# Patient Record
Sex: Female | Born: 1940 | Race: White | Hispanic: No | Marital: Married | Smoking: Never smoker
Health system: Southern US, Community
[De-identification: ages and names within clinical notes are randomized; demographics above are authoritative.]

## PROBLEM LIST (undated history)

## (undated) DIAGNOSIS — G4700 Insomnia, unspecified: Secondary | ICD-10-CM

## (undated) DIAGNOSIS — B009 Herpesviral infection, unspecified: Secondary | ICD-10-CM

## (undated) DIAGNOSIS — R1310 Dysphagia, unspecified: Secondary | ICD-10-CM

## (undated) DIAGNOSIS — A389 Scarlet fever, uncomplicated: Secondary | ICD-10-CM

## (undated) DIAGNOSIS — G4730 Sleep apnea, unspecified: Secondary | ICD-10-CM

## (undated) DIAGNOSIS — M1990 Unspecified osteoarthritis, unspecified site: Secondary | ICD-10-CM

## (undated) DIAGNOSIS — F419 Anxiety disorder, unspecified: Secondary | ICD-10-CM

## (undated) DIAGNOSIS — R5382 Chronic fatigue, unspecified: Secondary | ICD-10-CM

## (undated) DIAGNOSIS — E079 Disorder of thyroid, unspecified: Secondary | ICD-10-CM

## (undated) DIAGNOSIS — IMO0001 Reserved for inherently not codable concepts without codable children: Secondary | ICD-10-CM

## (undated) DIAGNOSIS — E038 Other specified hypothyroidism: Secondary | ICD-10-CM

## (undated) DIAGNOSIS — E785 Hyperlipidemia, unspecified: Secondary | ICD-10-CM

## (undated) DIAGNOSIS — M797 Fibromyalgia: Secondary | ICD-10-CM

## (undated) DIAGNOSIS — Z9989 Dependence on other enabling machines and devices: Secondary | ICD-10-CM

## (undated) DIAGNOSIS — I00 Rheumatic fever without heart involvement: Secondary | ICD-10-CM

## (undated) DIAGNOSIS — F329 Major depressive disorder, single episode, unspecified: Secondary | ICD-10-CM

## (undated) DIAGNOSIS — G43909 Migraine, unspecified, not intractable, without status migrainosus: Secondary | ICD-10-CM

## (undated) DIAGNOSIS — H2510 Age-related nuclear cataract, unspecified eye: Secondary | ICD-10-CM

## (undated) DIAGNOSIS — H9190 Unspecified hearing loss, unspecified ear: Secondary | ICD-10-CM

## (undated) DIAGNOSIS — R52 Pain, unspecified: Secondary | ICD-10-CM

## (undated) DIAGNOSIS — E559 Vitamin D deficiency, unspecified: Secondary | ICD-10-CM

## (undated) DIAGNOSIS — H43811 Vitreous degeneration, right eye: Secondary | ICD-10-CM

## (undated) DIAGNOSIS — K5900 Constipation, unspecified: Secondary | ICD-10-CM

## (undated) DIAGNOSIS — F32A Depression, unspecified: Secondary | ICD-10-CM

## (undated) DIAGNOSIS — C801 Malignant (primary) neoplasm, unspecified: Secondary | ICD-10-CM

## (undated) DIAGNOSIS — M722 Plantar fascial fibromatosis: Secondary | ICD-10-CM

## (undated) DIAGNOSIS — G4733 Obstructive sleep apnea (adult) (pediatric): Secondary | ICD-10-CM

## (undated) HISTORY — DX: Dysphagia, unspecified: R13.10

## (undated) HISTORY — DX: Malignant (primary) neoplasm, unspecified: C80.1

## (undated) HISTORY — DX: Plantar fascial fibromatosis: M72.2

## (undated) HISTORY — DX: Rheumatic fever without heart involvement: I00

## (undated) HISTORY — DX: Age-related nuclear cataract, unspecified eye: H25.10

## (undated) HISTORY — DX: Vitamin D deficiency, unspecified: E55.9

## (undated) HISTORY — DX: Migraine, unspecified, not intractable, without status migrainosus: G43.909

## (undated) HISTORY — DX: Insomnia, unspecified: G47.00

## (undated) HISTORY — DX: Depression, unspecified: F32.A

## (undated) HISTORY — DX: Vitreous degeneration, right eye: H43.811

## (undated) HISTORY — DX: Obstructive sleep apnea (adult) (pediatric): G47.33

## (undated) HISTORY — DX: Dependence on other enabling machines and devices: Z99.89

## (undated) HISTORY — PX: TONSILLECTOMY: SUR1361

## (undated) HISTORY — DX: Anxiety disorder, unspecified: F41.9

## (undated) HISTORY — DX: Herpesviral infection, unspecified: B00.9

## (undated) HISTORY — DX: Unspecified osteoarthritis, unspecified site: M19.90

## (undated) HISTORY — PX: TUBAL LIGATION: SHX77

## (undated) HISTORY — DX: Sleep apnea, unspecified: G47.30

## (undated) HISTORY — DX: Disorder of thyroid, unspecified: E07.9

## (undated) HISTORY — PX: OTHER SURGICAL HISTORY: SHX169

## (undated) HISTORY — DX: Chronic fatigue, unspecified: R53.82

## (undated) HISTORY — DX: Fibromyalgia: M79.7

## (undated) HISTORY — DX: Hyperlipidemia, unspecified: E78.5

## (undated) HISTORY — DX: Other specified hypothyroidism: E03.8

## (undated) HISTORY — DX: Major depressive disorder, single episode, unspecified: F32.9

---
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
## 1986-11-25 DIAGNOSIS — G43909 Migraine, unspecified, not intractable, without status migrainosus: Secondary | ICD-10-CM

## 1986-11-25 HISTORY — DX: Migraine, unspecified, not intractable, without status migrainosus: G43.909

## 1987-05-26 DIAGNOSIS — B009 Herpesviral infection, unspecified: Secondary | ICD-10-CM

## 1987-05-26 HISTORY — DX: Herpesviral infection, unspecified: B00.9

## 1995-11-25 HISTORY — PX: DE QUERVAIN'S RELEASE: SHX1439

## 1996-02-25 HISTORY — PX: BREAST EXCISIONAL BIOPSY: SUR124

## 1996-09-24 DIAGNOSIS — M797 Fibromyalgia: Secondary | ICD-10-CM

## 1996-09-24 HISTORY — DX: Fibromyalgia: M79.7

## 1997-12-04 ENCOUNTER — Ambulatory Visit (HOSPITAL_COMMUNITY): Admission: RE | Admit: 1997-12-04 | Discharge: 1997-12-04 | Payer: Self-pay

## 1998-01-24 DIAGNOSIS — E038 Other specified hypothyroidism: Secondary | ICD-10-CM

## 1998-01-24 HISTORY — DX: Other specified hypothyroidism: E03.8

## 1998-01-31 ENCOUNTER — Ambulatory Visit (HOSPITAL_COMMUNITY): Admission: RE | Admit: 1998-01-31 | Discharge: 1998-01-31 | Payer: Self-pay | Admitting: *Deleted

## 1998-02-19 ENCOUNTER — Ambulatory Visit (HOSPITAL_COMMUNITY): Admission: RE | Admit: 1998-02-19 | Discharge: 1998-02-19 | Payer: Self-pay | Admitting: *Deleted

## 1998-04-02 ENCOUNTER — Other Ambulatory Visit: Admission: RE | Admit: 1998-04-02 | Discharge: 1998-04-02 | Payer: Self-pay | Admitting: *Deleted

## 1998-07-26 HISTORY — PX: BREAST BIOPSY: SHX20

## 1998-11-20 ENCOUNTER — Encounter (INDEPENDENT_AMBULATORY_CARE_PROVIDER_SITE_OTHER): Payer: Self-pay | Admitting: Specialist

## 1998-11-20 ENCOUNTER — Ambulatory Visit (HOSPITAL_COMMUNITY): Admission: RE | Admit: 1998-11-20 | Discharge: 1998-11-20 | Payer: Self-pay | Admitting: *Deleted

## 1999-03-29 ENCOUNTER — Other Ambulatory Visit: Admission: RE | Admit: 1999-03-29 | Discharge: 1999-03-29 | Payer: Self-pay | Admitting: *Deleted

## 1999-07-18 ENCOUNTER — Encounter: Payer: Self-pay | Admitting: Gastroenterology

## 1999-07-18 ENCOUNTER — Ambulatory Visit (HOSPITAL_COMMUNITY): Admission: RE | Admit: 1999-07-18 | Discharge: 1999-07-18 | Payer: Self-pay | Admitting: Gastroenterology

## 1999-10-21 ENCOUNTER — Encounter: Payer: Self-pay | Admitting: Orthopaedic Surgery

## 1999-10-21 ENCOUNTER — Ambulatory Visit (HOSPITAL_COMMUNITY): Admission: RE | Admit: 1999-10-21 | Discharge: 1999-10-21 | Payer: Self-pay | Admitting: Orthopaedic Surgery

## 2000-04-15 ENCOUNTER — Other Ambulatory Visit: Admission: RE | Admit: 2000-04-15 | Discharge: 2000-04-15 | Payer: Self-pay | Admitting: *Deleted

## 2000-07-25 ENCOUNTER — Emergency Department (HOSPITAL_COMMUNITY): Admission: EM | Admit: 2000-07-25 | Discharge: 2000-07-25 | Payer: Self-pay | Admitting: Emergency Medicine

## 2000-07-25 ENCOUNTER — Encounter: Payer: Self-pay | Admitting: Orthopedic Surgery

## 2001-03-19 ENCOUNTER — Other Ambulatory Visit: Admission: RE | Admit: 2001-03-19 | Discharge: 2001-03-19 | Payer: Self-pay | Admitting: Obstetrics and Gynecology

## 2002-03-30 ENCOUNTER — Other Ambulatory Visit: Admission: RE | Admit: 2002-03-30 | Discharge: 2002-03-30 | Payer: Self-pay | Admitting: *Deleted

## 2003-04-12 ENCOUNTER — Other Ambulatory Visit: Admission: RE | Admit: 2003-04-12 | Discharge: 2003-04-12 | Payer: Self-pay | Admitting: *Deleted

## 2004-02-29 ENCOUNTER — Encounter: Admission: RE | Admit: 2004-02-29 | Discharge: 2004-02-29 | Payer: Self-pay | Admitting: Rheumatology

## 2004-05-06 ENCOUNTER — Other Ambulatory Visit: Admission: RE | Admit: 2004-05-06 | Discharge: 2004-05-06 | Payer: Self-pay | Admitting: *Deleted

## 2004-08-24 HISTORY — PX: TOTAL KNEE ARTHROPLASTY: SHX125

## 2004-10-05 ENCOUNTER — Encounter: Admission: RE | Admit: 2004-10-05 | Discharge: 2004-10-05 | Payer: Self-pay | Admitting: *Deleted

## 2005-02-24 DIAGNOSIS — M1990 Unspecified osteoarthritis, unspecified site: Secondary | ICD-10-CM

## 2005-02-24 HISTORY — DX: Unspecified osteoarthritis, unspecified site: M19.90

## 2005-03-27 DIAGNOSIS — G4733 Obstructive sleep apnea (adult) (pediatric): Secondary | ICD-10-CM

## 2005-03-27 HISTORY — DX: Obstructive sleep apnea (adult) (pediatric): G47.33

## 2005-05-09 ENCOUNTER — Other Ambulatory Visit: Admission: RE | Admit: 2005-05-09 | Discharge: 2005-05-09 | Payer: Self-pay | Admitting: Obstetrics & Gynecology

## 2005-09-08 ENCOUNTER — Inpatient Hospital Stay (HOSPITAL_COMMUNITY): Admission: RE | Admit: 2005-09-08 | Discharge: 2005-09-12 | Payer: Self-pay | Admitting: Orthopedic Surgery

## 2006-05-14 ENCOUNTER — Other Ambulatory Visit: Admission: RE | Admit: 2006-05-14 | Discharge: 2006-05-14 | Payer: Self-pay | Admitting: Obstetrics & Gynecology

## 2006-05-19 ENCOUNTER — Encounter: Admission: RE | Admit: 2006-05-19 | Discharge: 2006-05-19 | Payer: Self-pay | Admitting: Obstetrics & Gynecology

## 2006-10-15 ENCOUNTER — Encounter: Admission: RE | Admit: 2006-10-15 | Discharge: 2006-10-15 | Payer: Self-pay | Admitting: Obstetrics & Gynecology

## 2007-08-16 ENCOUNTER — Other Ambulatory Visit: Admission: RE | Admit: 2007-08-16 | Discharge: 2007-08-16 | Payer: Self-pay | Admitting: Obstetrics & Gynecology

## 2007-09-01 LAB — HM DEXA SCAN

## 2007-10-19 ENCOUNTER — Encounter: Admission: RE | Admit: 2007-10-19 | Discharge: 2007-10-19 | Payer: Self-pay | Admitting: Obstetrics & Gynecology

## 2008-11-02 ENCOUNTER — Encounter: Admission: RE | Admit: 2008-11-02 | Discharge: 2008-11-02 | Payer: Self-pay | Admitting: Obstetrics & Gynecology

## 2009-02-12 DIAGNOSIS — M899 Disorder of bone, unspecified: Secondary | ICD-10-CM | POA: Insufficient documentation

## 2009-02-12 DIAGNOSIS — G43909 Migraine, unspecified, not intractable, without status migrainosus: Secondary | ICD-10-CM | POA: Insufficient documentation

## 2009-02-12 DIAGNOSIS — M722 Plantar fascial fibromatosis: Secondary | ICD-10-CM | POA: Insufficient documentation

## 2009-11-19 ENCOUNTER — Encounter: Admission: RE | Admit: 2009-11-19 | Discharge: 2009-11-19 | Payer: Self-pay | Admitting: Obstetrics & Gynecology

## 2010-03-16 ENCOUNTER — Encounter: Payer: Self-pay | Admitting: Rheumatology

## 2010-03-17 ENCOUNTER — Encounter: Payer: Self-pay | Admitting: Obstetrics & Gynecology

## 2010-07-12 NOTE — Op Note (Signed)
NAMEJESSE, HIRST                  ACCOUNT NO.:  [id number]   MEDICAL RECORD NO.:  [id number]          PATIENT TYPE:  INP   LOCATION:  NA                           FACILITY:  [facility]   PHYSICIAN:  Ollen Gross, M.D.    DATE OF BIRTH:  12-01-1940   DATE OF PROCEDURE:  09/08/2005  DATE OF DISCHARGE:                                 OPERATIVE REPORT   PREOPERATIVE DIAGNOSIS:  Osteoarthritis, left knee.   POSTOPERATIVE DIAGNOSIS:  Osteoarthritis, left knee.   PROCEDURE:  Left total knee arthroplasty.   SURGEON:  Ollen Gross, M.D.   ASSISTANT:  Alexzandrew L. Julien Girt, P.A.   ANESTHESIA:  General with postop Marcaine pain pump.   ESTIMATED BLOOD LOSS:  Minimal.   DRAINS:  Hemovac x1.   TOURNIQUET TIME:  38 minutes at 300 mmHg.   COMPLICATIONS:  None.  Condition stable to recovery.   BRIEF CLINICAL NOTE:  Ms. Redondo is a 70 year old female who has end-stage  osteoarthritis of the left knee with intractable pain.  She has failed  nonoperative management and presents for total knee arthroplasty.   PROCEDURE IN DETAIL:  The successful initiation of general anesthetic, a  tourniquet was placed on the left thigh and left lower extremity prepped and  draped in usual sterile fashion.  Extremity was wrapped in Esmarch, knee  flexed, tourniquet inflated to 300 mmHg.  Midline incision was made with 10  blade through subcutaneous tissue to level of the extensor mechanism.  Fresh  blade is used to make a medial parapatellar arthrotomy.  Some tissue over  the proximal medial tibia was subperiosteally elevated to the joint line  with a knife into the semimembranosus bursa with a Cobb elevator.  Soft  tissue of the proximal lateral tibia is also elevated attention being paid  to avoid patellar tendon on tibial tubercle.  Patella subluxed laterally,  knee flexed 90 degrees, ACL and PCL removed.  Drill was used to create a  starting hole and the distal femur canal was thoroughly irrigated.  5  degrees left valgus alignment guide was placed referencing off the posterior  condyles, rotations marked and the block pinned to remove 10 mm off the  distal femur.  Distal femoral resection made with an oscillating saw.  Size  3 was the most appropriate femoral component and rotation is marked off the  epicondylar axis.  Size 3 cutting block is placed and the anterior,  posterior and chamfer cuts made.   The tibia subluxed forward and the menisci removed.  Extramedullary tibial  alignment guide is placed referencing proximally at the medial aspect of  tibial tubercle and distally along the second metatarsal axis and tibial  crest.  Blocks pinned to remove approximately 10 mm of the nondeficient  lateral side.  We did not get down to the base of the medial defect so we  took additional 2 mm into the base of the medial defect.  Tibial resection  is made with an oscillating saw.  Size 3 the most appropriate tibial  component and a proximal tibia prepared with the modular drill  and keel  punch for the size 3.  Femoral preparation was completed with the  intercondylar cut.   Size 3 mobile bearing tibial trial with a size 3 posterior stabilized  femoral trial and a 12.5 mm posterior stabilized rotating platform insert  trial placed.  With the 12.5 full extension was achieved with excellent  varus and valgus balance throughout full range of motion.  Patella was  everted, thickness measured to be 20 mm and free hand resection taken to 11  mm.  35 template was placed, lug holes were drilled, trial patella was  placed and it tracks normally.  The osteophyte were then removed with the  posterior femur trial in place.  All trials removed and the cut bone  surfaces prepared with pulsatile lavage.  Cement mixed and once ready for  implantation the size 3 mobile bearing tibial tray, size 3 posterior  stabilized femur and 35 patella are cemented into place.  The patella was  held with the clamp.  Trial  12.5 insert was placed.  Knee held in full  extension, all extruded cement removed.  Once cement fully hardened, then a  permanent 12.5 mm posterior stabilized rotating platform insert is placed  into the tibial tray.  Wound was copiously irrigated with saline solution  and the extensor mechanism closed over Hemovac drain with interrupted #1  PDS.  Flexion against gravity is 135 degrees.  Tourniquet was released with  a total time of 38 minutes.  Subcu closed interrupted 2-0 Vicryl,  subcuticular running 4-0 Monocryl.  Drains hooked to suction.  The catheter  for Marcaine pain pump was placed and the pump initiated.  Steri-Strips and  bulky sterile dressing are applied and she is awakened and transferred to  recovery in stable condition.      Ollen Gross, M.D.  Electronically Signed     FA/MEDQ  D:  09/08/2005  T:  09/08/2005  Job:  [id number]

## 2010-07-12 NOTE — H&P (Signed)
NAMELAKEVA, [name]                  ACCOUNT NO.:  [id number]   MEDICAL RECORD NO.:  [id number]           PATIENT TYPE:   LOCATION:                                 FACILITY:   PHYSICIAN:  Ollen Gross, M.D.         DATE OF BIRTH:   DATE OF ADMISSION:  09/08/2005  DATE OF DISCHARGE:                                HISTORY & PHYSICAL   DATE OF OFFICE VISIT AND HISTORY & PHYSICAL:  September 02, 2005.   CHIEF COMPLAINT:  Left knee pain.   HISTORY OF PRESENT ILLNESS:  The patient is a 70 year old female who has  been seen by Dr. Lequita [name] for ongoing left knee pain.  It has progressively  gotten worse over several years now.  No specific injury. She has been seen  by Dr. Corliss [name] in the past and has undergone cortisone and also Hyalgan  injections without much benefit. Despite conservative measures she has  continued having worsening pain.  She has been seen in the office where x-  rays show bone on bone in the medial compartment with also some involvement  laterally and marginal osteophytes, and also some patellofemoral spurring.  She has reached the point where she would like to have something done about  it.  Risks and benefits have been discussed with the patient at length and  she elects to proceed with surgery.   ALLERGIES:  No known drug allergies.   INTOLERANCES:  1.  Codeine causes nausea.  2.  Percocet causes nausea.  3.  The patient is able to take Vicodin though.   CURRENT MEDICATIONS:  1.  Trimethoprim p.r.n. after intercourse.  2.  Celebrex 200 mg b.i.d.  3.  MiraLax p.r.n.  4.  Synthroid.  5.  Cytomel.  6.  Lunesta.  7.  Flexeril.  8.  Risperdal.  9.  Lodine and Cafergot occasionally.   PAST MEDICAL HISTORY:  1.  Migraines.  2.  Chronic insomnia.  3.  History of bronchitis.  4.  Sleep apnea.  5.  Hypothyroidism.  6.  Postmenopausal.  7.  Osteoporosis.  8.  History of UTIs.  9.  History of cystitis.   PAST SURGICAL HISTORY:  1.  Tonsillectomy and  adenoidectomy.  2.  Urethral surgery.  3.  Left breast lumpectomy benign.  4.  Face lift at age 22.  5.  Right arm surgery.  6.  Left wrist surgery.   SOCIAL HISTORY:  Married, retired, nonsmoker. Alcohol six to eight drinks  per week.  No children.   REVIEW OF SYSTEMS:  GENERAL: No fever, chills, nightsweats.  NEUROLOGIC:  History of chronic insomnia.  No seizure or syncope.  Does have migraines.  RESPIRATORY:  No shortness of breath, productive cough, or hemoptysis. She  does have sleep apnea which she uses a CPAP machine for.  CARDIOVASCULAR:  No chest pain, angina, or orthopnea. GI:  She does have chronic  constipation. No nausea, vomiting, or diarrhea.  GU: History of UTIs.  No  dysuria, hematuria, or discharge.  MUSCULOSKELETAL: Pertinent to that of the  left  knee.  VASCULAR:  Patient does give a significant history of having  veins that infiltrate very easily.   PHYSICAL EXAMINATION:  VITAL SIGNS: Pulse 74, respirations 16, blood  pressure 110/68.  GENERAL: A 70 year old female, well-nourished, well-developed, in no acute  distress, alert, oriented, cooperative, somewhat anxious at the time of the  exam. She is accompanied by her husband.  HEENT:  Normocephalic and atraumatic. Pupils are round and reactive.  Oropharynx is clear. EOMs are intact. Does have contact in the left eye.  NECK:  Supple. No carotid bruits are appreciated.  CHEST:  Clear, anterior and posterior chest wall. No rales, rhonchi, or  wheezes.  HEART:  Regular rate and rhythm.  No murmurs. S1 and S2 noted.  ABDOMEN:  Soft, nontender.  Bowel sounds are present.  RECTAL/BREASTS/GENITALIA: Not done; not pertinent to the present illness.  EXTREMITIES: The left knee showed range of motion of 5 to 123. She has  marked crepitus done on passive  range of motion. She is more tender radial  than lateral.   IMPRESSION:  1.  Osteoarthritis, left knee.  2.  Migraines.  3.  Chronic insomnia.  4.  History of  bronchitis.  5.  Chronic constipation.  6.  History of urinary tract infections.  7.  History of cystitis.  8.  Hypothyroidism.  9.  Osteoporosis.  10. Postmenopausal.  11. Sleep apnea for which she uses a CPAP machine.   PLAN:  The patient will be admitted to [name] and undergo  right total knee arthroplasty. Surgery will be performed by Dr. Ollen Gross.  The patient has been seen by Dr. Vianne [name] preoperatively and  felt  stable to undergo the surgery.  Dr. Tenny [name] says he will be available for any  information or any concerns, adjusting her medication would be available,  access through phone call. Will notify Dr. Tenny [name] if assistance is needed for  her medical care.      [name], P.A.      Ollen Gross, M.D.  Electronically Signed    ALP/MEDQ  D:  09/07/2005  T:  09/08/2005  Job:  [id number]   cc:   C. Duane Lope, M.D.  Fax: [phone]   Lyn Records, M.D.  Fax: [phone]   Ollen Gross, M.D.  Fax: [phone]

## 2010-07-12 NOTE — Discharge Summary (Signed)
[name], [name]                  ACCOUNT NO.:  [id number]   MEDICAL RECORD NO.:  [id number]          PATIENT TYPE:  INP   LOCATION:  1512                         FACILITY:  [facility]   PHYSICIAN:  [name], M.D.    DATE OF BIRTH:  1940/06/15   DATE OF ADMISSION:  09/08/2005  DATE OF DISCHARGE:  09/12/2005                                 DISCHARGE SUMMARY   ADMISSION DIAGNOSES:  1. Osteoarthritis left knee.  2. Migraines.  3. Chronic insomnia.  4. History of bronchitis.  5. Chronic constipation.  6. History of urinary tract infections.  7. History of cystitis.  8. Hypothyroidism.  9. Osteoporosis.  10.Postmenopausal.  11.Sleep apnea which she uses a CPAP machine.   DISCHARGE DIAGNOSES:  1. Osteoarthritis left knee status post left total knee arthroplasty.  2. Acute blood loss anemia that did not require transfusion.  3. Migraines.  4. Chronic insomnia.  5. History of bronchitis.  6. Chronic constipation.  7. History of urinary tract infections.  8. History of cystitis.  9. Hypothyroidism.  10.Osteoporosis.  11.Postmenopausal.  12.Sleep apnea which she uses a CPAP machine.   PROCEDURE:  September 08, 2005 left total knee. Surgeon Dr. Lequita [name], assistant  Brenda Peace, PA-C. Anesthesia general with postop Marcaine pain pump.  Tourniquet time 38 minutes.   BRIEF HISTORY:  [name] is a 70 year old female with end-stage arthritis  of the left knee with intractable pain who failed operative management and  now presents for total knee.   LABORATORY DATA:  Preoperative CBC hemoglobin 13.4, hematocrit 39.7,  Postoperative hemoglobin 10.8 drifted down to 9.9 with a hematocrit of 29.5.  PT and PTT preop were 12.1 and 28 respectively. INR is 0.9. Serial protimes  followed and last noted PT/INR 18.5 and 1.5. Chem panel on admission all  within normal limits. Serial BMETs are followed. Electrolytes remained  within normal limits. Preop UA negative. Blood group type O positive.  Two  view chest September 03, 2005 preop, bibasilar subsegmental atelectasis and/or  scarring. Portable abdomen films September 10, 2005 Dilation central pelvis  probably related to distended urinary bladder, small to moderate of stool  with nonspecific bowel gas pattern.   HOSPITAL COURSE:  Admitted to [facility], tolerated procedure  well, later transferred to recovery room and then to the orthopedic floor,  started on PCA and p.o. analgesics for pain control following surgery.  Started getting up out of bed on day 1. The patient did have a fair amount  of pain but was doing a little bit better especially for day 1. Seen in  rounds by Dr. Lequita [name] who felt the itching was coming from the PCA,  discontinued that, recommended the CPAP machine at night which she used at  home. Excellent urinary output. Starting getting up with physical therapy  out of bed. On the second day was not feeling very well. She did have pain  in the knee, her belly felt tight as though she was impacted. KUB ordered,  nonspecific gas pattern, bladder scanned, had not voided yet but did have  excellent urinary output.  Recommend Fleets enema and IV Reglan to help out  with progression of her abdomen and bowels. She did have a little bit of  nausea. Starting getting up with physical therapy and walked short  ambulation requiring minimal to moderate assist. By day 3, she was feeling  much better, Foley was removed for voiding trials. She did have a small  bowel movement, the pain was doing much better, incision looked good,  discontinued the Foley and IVs and fluid. Continued to progress with  physical therapy and was ready to go home and feeling much better by the  following day on September 12, 2005.   PLAN:  1. The patient was discharged home on September 12, 2005.  2. Discharge diagnoses, please see above.  3. Discharge medications:  Coumadin, Percocet, Robaxin.  4. Diet:  Resume previous home diet.  5. Activity:   Weightbearing as tolerated, total knee protocol, home health      PT, home health nursing.  6. Followup:  2 weeks.   DISPOSITION:  Home.   CONDITION ON DISCHARGE:  Improved.      [name], P.A.      [name], M.D.  Electronically Signed    ALP/MEDQ  D:  10/28/2005  T:  10/28/2005  Job:  [id number]   cc:   [name], M.D.  Fax: [phone]   [name], M.D.  Fax: [phone]

## 2010-07-12 NOTE — Procedures (Signed)
[facility]  Patient:    [name], [name]                      MRN: [id number] Proc. Date: 07/18/99 Attending:  Verlin Grills, M.D. CC:         Fritzi Mandes, M.D.             Catalina Lunger, M.D.                           Procedure Report  REFERRING PHYSICIANS: 1. Fritzi Mandes, M.D. 2. Catalina Lunger, M.D.  PROCEDURE PERFORMED:  Colonoscopy.  ENDOSCOPIST:  Verlin Grills, M.D.  INDICATIONS FOR PROCEDURE:  The patient is a 70 year old female with intermittent hematochezia.  I discussed with the patient the complications associated with colonoscopy and polypectomy including intestinal bleeding and intestinal perforation. The patient has signed the operative permit.  PREMEDICATION:  Fentanyl 62.5 mcg, Versed 12 mg.  ENDOSCOPE:  Pediatric Olympus colonoscope.  DESCRIPTION OF PROCEDURE:  After obtaining informed consent, the patient was placed in left lateral decubitus position.  I administered intravenous Demerol and intravenous Versed to achieve conscious sedation for the procedure.  The patients blood pressure, oxygen saturations and cardiac rhythm were monitored throughout the procedure and documented in the medical record.  Anal inspection was normal.  Digital rectal exam was normal.  The pediatric Olympus video colonoscope was introduced into the rectum and under direct vision advanced to approximately 80 cm from the anal verge.  Due to colonic loop formation which could not be controlled with external abdominal pressure, I was unable to complete a full colonoscopy.  Colonic preparation for the exam today was excellent.  The endoscopic appearance of the rectum and colon to 80 cm from the anal verge was completely normal.  There was no evidence of inflammatory bowel disease, bleeding or colorectal neoplasia.  IMPRESSION:  Normal flexible proctocolonoscopy to 80 cm from the anal verge.  PLAN:  Air  contrast barium enema to follow at the [facility]. D:  07/18/99 TD:  07/22/99 Job: [id number]

## 2010-07-15 LAB — HM COLONOSCOPY

## 2010-10-15 ENCOUNTER — Other Ambulatory Visit: Payer: Self-pay | Admitting: Obstetrics & Gynecology

## 2010-10-15 DIAGNOSIS — Z1231 Encounter for screening mammogram for malignant neoplasm of breast: Secondary | ICD-10-CM

## 2010-12-04 ENCOUNTER — Ambulatory Visit: Payer: Self-pay

## 2010-12-05 ENCOUNTER — Ambulatory Visit
Admission: RE | Admit: 2010-12-05 | Discharge: 2010-12-05 | Disposition: A | Discharge status: Home / Self Care | Payer: BC Managed Care – PPO | Source: Ambulatory Visit | Attending: Obstetrics & Gynecology | Admitting: Obstetrics & Gynecology

## 2010-12-05 DIAGNOSIS — Z1231 Encounter for screening mammogram for malignant neoplasm of breast: Secondary | ICD-10-CM

## 2011-03-24 DIAGNOSIS — E785 Hyperlipidemia, unspecified: Secondary | ICD-10-CM | POA: Diagnosis not present

## 2011-03-24 DIAGNOSIS — G43909 Migraine, unspecified, not intractable, without status migrainosus: Secondary | ICD-10-CM | POA: Diagnosis not present

## 2011-03-24 DIAGNOSIS — E559 Vitamin D deficiency, unspecified: Secondary | ICD-10-CM | POA: Diagnosis not present

## 2011-03-24 DIAGNOSIS — E039 Hypothyroidism, unspecified: Secondary | ICD-10-CM | POA: Diagnosis not present

## 2011-03-31 DIAGNOSIS — H524 Presbyopia: Secondary | ICD-10-CM | POA: Diagnosis not present

## 2011-03-31 DIAGNOSIS — H04129 Dry eye syndrome of unspecified lacrimal gland: Secondary | ICD-10-CM | POA: Diagnosis not present

## 2011-04-29 ENCOUNTER — Ambulatory Visit (INDEPENDENT_AMBULATORY_CARE_PROVIDER_SITE_OTHER): Payer: Medicare Other | Admitting: Sports Medicine

## 2011-04-29 VITALS — BP 122/70 | Ht 66.5 in | Wt 180.0 lb

## 2011-04-29 DIAGNOSIS — IMO0002 Reserved for concepts with insufficient information to code with codable children: Secondary | ICD-10-CM

## 2011-04-29 DIAGNOSIS — M1710 Unilateral primary osteoarthritis, unspecified knee: Secondary | ICD-10-CM | POA: Diagnosis not present

## 2011-04-29 DIAGNOSIS — M25569 Pain in unspecified knee: Secondary | ICD-10-CM | POA: Diagnosis not present

## 2011-04-29 DIAGNOSIS — M25561 Pain in right knee: Secondary | ICD-10-CM | POA: Insufficient documentation

## 2011-04-29 NOTE — Assessment & Plan Note (Signed)
Will try insoles to cushion DJD Try compression sleeve

## 2011-04-29 NOTE — Progress Notes (Signed)
[name] is a 71 y.o. female who presents to [facility] today for right knee pain.  Patient has a long history of bilateral knee arthritis with a left knee replacement by Dr.Alusio several years ago.  She has an appointment scheduled with him in April. She's had 5 rounds of synovial replacement injections in her right knee.  Additionally she has had many steroid injections in the knee as well.  She notes continued pain and crepitations in her knee.    She was doing water aerobics one month ago which is a pop and worsening pain.  She denies any instability.  She would like a knee replacement however in the meantime she wonders if she can have another round of synovial replacement injection or other treatment to help prevent pain.  Pain at present is mild range.   PMH reviewed. Significant for knee arthritis ROS as above otherwise neg   Exam:  BP 122/70  Ht 5' 6.5" (1.689 m)  Wt 180 lb (81.647 kg)  BMI 28.62 kg/m2 Gen: Well NAD MSK: Range of motion is essentially normal.  No significant effusion .Crepitations present throughout  knee. Chronic DJD changes. Positive patellar grind test.  Negative Lachman's or instability or pain to valgus or varus stress.  McMurray's is mildly positive.   Leg lengths are equal.  Feet are essentially normal.

## 2011-04-29 NOTE — Assessment & Plan Note (Addendum)
Arthritis present throughout, however he function is essentially normal and pain limited. Will plan to avoid Synvisc injection today if possible. We'll treat swelling with compression sleeve which additionally will help since of stability due to proprioception effect. Additionally we'll do sports and soles for additional cushioning Plan to do quad strength exercises and followup in 6 weeks. Will give cortisone injection if pain flares up.

## 2011-04-29 NOTE — Patient Instructions (Signed)
Thank you for coming in today. Please do the exercises circled in the handout.  Please follow up with [place] in 6 weeks.  If you have bad pain please come back or go to [facility] orthopedics for an injection.  Use the knee sleeve as needed.

## 2011-05-02 DIAGNOSIS — G43909 Migraine, unspecified, not intractable, without status migrainosus: Secondary | ICD-10-CM | POA: Diagnosis not present

## 2011-05-02 DIAGNOSIS — G4700 Insomnia, unspecified: Secondary | ICD-10-CM | POA: Diagnosis not present

## 2011-05-02 DIAGNOSIS — G4730 Sleep apnea, unspecified: Secondary | ICD-10-CM | POA: Diagnosis not present

## 2011-05-05 DIAGNOSIS — J069 Acute upper respiratory infection, unspecified: Secondary | ICD-10-CM | POA: Diagnosis not present

## 2011-05-05 DIAGNOSIS — J029 Acute pharyngitis, unspecified: Secondary | ICD-10-CM | POA: Diagnosis not present

## 2011-06-10 DIAGNOSIS — M1710 Unilateral primary osteoarthritis, unspecified knee: Secondary | ICD-10-CM | POA: Diagnosis not present

## 2011-06-10 DIAGNOSIS — IMO0002 Reserved for concepts with insufficient information to code with codable children: Secondary | ICD-10-CM | POA: Diagnosis not present

## 2011-06-17 DIAGNOSIS — IMO0002 Reserved for concepts with insufficient information to code with codable children: Secondary | ICD-10-CM | POA: Diagnosis not present

## 2011-06-17 DIAGNOSIS — M1710 Unilateral primary osteoarthritis, unspecified knee: Secondary | ICD-10-CM | POA: Diagnosis not present

## 2011-06-18 ENCOUNTER — Encounter: Payer: Self-pay | Admitting: Sports Medicine

## 2011-06-18 ENCOUNTER — Ambulatory Visit (INDEPENDENT_AMBULATORY_CARE_PROVIDER_SITE_OTHER): Payer: Medicare Other | Admitting: Sports Medicine

## 2011-06-18 VITALS — BP 122/80 | HR 84

## 2011-06-18 DIAGNOSIS — M25511 Pain in right shoulder: Secondary | ICD-10-CM | POA: Insufficient documentation

## 2011-06-18 DIAGNOSIS — M79672 Pain in left foot: Secondary | ICD-10-CM | POA: Insufficient documentation

## 2011-06-18 DIAGNOSIS — M25519 Pain in unspecified shoulder: Secondary | ICD-10-CM | POA: Diagnosis not present

## 2011-06-18 DIAGNOSIS — M79609 Pain in unspecified limb: Secondary | ICD-10-CM | POA: Diagnosis not present

## 2011-06-18 MED ORDER — TRAMADOL HCL 50 MG PO TABS: 50.0000 mg | ORAL_TABLET | Freq: Three times a day (TID) | ORAL | Status: DC | PRN

## 2011-06-18 MED ORDER — CELECOXIB 400 MG PO CAPS: 400.0000 mg | ORAL_CAPSULE | Freq: Every day | ORAL | Status: DC

## 2011-06-18 NOTE — Assessment & Plan Note (Signed)
Arch strain with metatarsalgia. Arch strap. Sports Insoles. MT pads. Incr celebrex to 400mg .

## 2011-06-18 NOTE — Patient Instructions (Signed)
Shoulder exercises. Inserts, arch strap, metatarsal pad.  Come back to see [place] in 6 weeks.  Ihor Austin. Benjamin Stain, M.D.

## 2011-06-18 NOTE — Assessment & Plan Note (Addendum)
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 [facility] rehab with theraband for 6 weeks. Incr celebrex to 400mg  qd. Adding tramadol. Injection, subacromial if no better at next visit.

## 2011-06-18 NOTE — Progress Notes (Signed)
  Subjective:    Patient ID: [name], female    DOB: 03-21-1940, 71 y.o.   MRN: [id number]  HPI This pleasant patient comes to see [place] with right shoulder and left arch pain.  Right shoulder: Worse with overhead activities, no injuries, localized over deltoid.  No mechanical symptoms.  Better with NSAIDS.  Left Arch:  Worse with weight bearing, localized over long arch, mid.    Hx of fibromyalgia with TX of this by rheumatologist Sleep apnea and insomnia Tx by Dr Marylou Flesher  Past medical history, surgical history, family history, social history, allergies, and medications reviewed from the medical record and no changes needed. Review of Systems    No fevers, chills, night sweats, weight loss, chest pain, or shortness of breath.  Objective:   Physical Exam General:  Well developed, well nourished, and in no acute distress. Neuro:  Alert and oriented x3, extra-ocular muscles intact. Skin: Warm and dry, no rashes noted. Respiratory:  Not using accessory muscles, speaking in full sentences. Right Shoulder: Inspection reveals no abnormalities, atrophy or asymmetry. Palpation is normal with no tenderness over AC joint or bicipital groove. ROM is full in all planes. Rotator cuff strength weak to ER and abd. Positive Neer, Hawkins, empty can. Speeds and Yergason's tests normal. No labral pathology noted with negative Obrien's, negative clunk and good stability. Normal scapular function observed. No painful arc and no drop arm sign. No apprehension sign  Left Foot inspection and palpation reveals breakdown of the transverse arch and a drop of MT heads: Abnormal callous is present: 2nd and 3rd MT heads Clawing of the toes present.  Sports insoles with metatarsal pad, an arch strap placed. These were comfortable.     Assessment & Plan:

## 2011-06-24 ENCOUNTER — Other Ambulatory Visit: Payer: Self-pay | Admitting: *Deleted

## 2011-06-24 DIAGNOSIS — M1710 Unilateral primary osteoarthritis, unspecified knee: Secondary | ICD-10-CM | POA: Diagnosis not present

## 2011-06-24 DIAGNOSIS — M25511 Pain in right shoulder: Secondary | ICD-10-CM

## 2011-06-24 DIAGNOSIS — IMO0002 Reserved for concepts with insufficient information to code with codable children: Secondary | ICD-10-CM | POA: Diagnosis not present

## 2011-06-24 MED ORDER — CELECOXIB 400 MG PO CAPS: 400.0000 mg | ORAL_CAPSULE | Freq: Every day | ORAL | Status: DC

## 2011-06-24 MED ORDER — TRAMADOL HCL 50 MG PO TABS: 50.0000 mg | ORAL_TABLET | Freq: Three times a day (TID) | ORAL | Status: DC | PRN

## 2011-06-25 ENCOUNTER — Other Ambulatory Visit: Payer: Self-pay | Admitting: *Deleted

## 2011-06-25 MED ORDER — CELECOXIB 200 MG PO CAPS: ORAL_CAPSULE | ORAL | Status: DC

## 2011-06-30 DIAGNOSIS — R5381 Other malaise: Secondary | ICD-10-CM | POA: Diagnosis not present

## 2011-06-30 DIAGNOSIS — R5383 Other fatigue: Secondary | ICD-10-CM | POA: Diagnosis not present

## 2011-06-30 DIAGNOSIS — E039 Hypothyroidism, unspecified: Secondary | ICD-10-CM | POA: Diagnosis not present

## 2011-07-09 DIAGNOSIS — M62838 Other muscle spasm: Secondary | ICD-10-CM | POA: Diagnosis not present

## 2011-07-09 DIAGNOSIS — M19049 Primary osteoarthritis, unspecified hand: Secondary | ICD-10-CM | POA: Diagnosis not present

## 2011-07-09 DIAGNOSIS — M3500 Sicca syndrome, unspecified: Secondary | ICD-10-CM | POA: Diagnosis not present

## 2011-07-09 DIAGNOSIS — M542 Cervicalgia: Secondary | ICD-10-CM | POA: Diagnosis not present

## 2011-07-22 DIAGNOSIS — M9981 Other biomechanical lesions of cervical region: Secondary | ICD-10-CM | POA: Diagnosis not present

## 2011-07-22 DIAGNOSIS — R51 Headache: Secondary | ICD-10-CM | POA: Diagnosis not present

## 2011-07-22 DIAGNOSIS — M542 Cervicalgia: Secondary | ICD-10-CM | POA: Diagnosis not present

## 2011-07-22 DIAGNOSIS — M5030 Other cervical disc degeneration, unspecified cervical region: Secondary | ICD-10-CM | POA: Diagnosis not present

## 2011-07-23 ENCOUNTER — Ambulatory Visit: Payer: Medicare Other | Admitting: Sports Medicine

## 2011-07-23 DIAGNOSIS — M542 Cervicalgia: Secondary | ICD-10-CM | POA: Diagnosis not present

## 2011-07-23 DIAGNOSIS — R51 Headache: Secondary | ICD-10-CM | POA: Diagnosis not present

## 2011-07-23 DIAGNOSIS — M5030 Other cervical disc degeneration, unspecified cervical region: Secondary | ICD-10-CM | POA: Diagnosis not present

## 2011-07-23 DIAGNOSIS — M9981 Other biomechanical lesions of cervical region: Secondary | ICD-10-CM | POA: Diagnosis not present

## 2011-07-24 DIAGNOSIS — M5030 Other cervical disc degeneration, unspecified cervical region: Secondary | ICD-10-CM | POA: Diagnosis not present

## 2011-07-24 DIAGNOSIS — R51 Headache: Secondary | ICD-10-CM | POA: Diagnosis not present

## 2011-07-24 DIAGNOSIS — M542 Cervicalgia: Secondary | ICD-10-CM | POA: Diagnosis not present

## 2011-07-24 DIAGNOSIS — M9981 Other biomechanical lesions of cervical region: Secondary | ICD-10-CM | POA: Diagnosis not present

## 2011-07-28 DIAGNOSIS — M9981 Other biomechanical lesions of cervical region: Secondary | ICD-10-CM | POA: Diagnosis not present

## 2011-07-28 DIAGNOSIS — M542 Cervicalgia: Secondary | ICD-10-CM | POA: Diagnosis not present

## 2011-07-28 DIAGNOSIS — R51 Headache: Secondary | ICD-10-CM | POA: Diagnosis not present

## 2011-07-28 DIAGNOSIS — M5030 Other cervical disc degeneration, unspecified cervical region: Secondary | ICD-10-CM | POA: Diagnosis not present

## 2011-07-30 ENCOUNTER — Ambulatory Visit: Payer: Medicare Other | Admitting: Sports Medicine

## 2011-07-30 DIAGNOSIS — M76829 Posterior tibial tendinitis, unspecified leg: Secondary | ICD-10-CM | POA: Diagnosis not present

## 2011-07-31 DIAGNOSIS — M5030 Other cervical disc degeneration, unspecified cervical region: Secondary | ICD-10-CM | POA: Diagnosis not present

## 2011-07-31 DIAGNOSIS — L738 Other specified follicular disorders: Secondary | ICD-10-CM | POA: Diagnosis not present

## 2011-07-31 DIAGNOSIS — M542 Cervicalgia: Secondary | ICD-10-CM | POA: Diagnosis not present

## 2011-07-31 DIAGNOSIS — L739 Follicular disorder, unspecified: Secondary | ICD-10-CM | POA: Diagnosis not present

## 2011-07-31 DIAGNOSIS — R51 Headache: Secondary | ICD-10-CM | POA: Diagnosis not present

## 2011-07-31 DIAGNOSIS — M9981 Other biomechanical lesions of cervical region: Secondary | ICD-10-CM | POA: Diagnosis not present

## 2011-07-31 DIAGNOSIS — D485 Neoplasm of uncertain behavior of skin: Secondary | ICD-10-CM | POA: Diagnosis not present

## 2011-08-05 DIAGNOSIS — M542 Cervicalgia: Secondary | ICD-10-CM | POA: Diagnosis not present

## 2011-08-05 DIAGNOSIS — M9981 Other biomechanical lesions of cervical region: Secondary | ICD-10-CM | POA: Diagnosis not present

## 2011-08-05 DIAGNOSIS — M5030 Other cervical disc degeneration, unspecified cervical region: Secondary | ICD-10-CM | POA: Diagnosis not present

## 2011-08-05 DIAGNOSIS — R51 Headache: Secondary | ICD-10-CM | POA: Diagnosis not present

## 2011-08-12 DIAGNOSIS — M9981 Other biomechanical lesions of cervical region: Secondary | ICD-10-CM | POA: Diagnosis not present

## 2011-08-12 DIAGNOSIS — M542 Cervicalgia: Secondary | ICD-10-CM | POA: Diagnosis not present

## 2011-08-12 DIAGNOSIS — R51 Headache: Secondary | ICD-10-CM | POA: Diagnosis not present

## 2011-08-12 DIAGNOSIS — M5030 Other cervical disc degeneration, unspecified cervical region: Secondary | ICD-10-CM | POA: Diagnosis not present

## 2011-08-13 DIAGNOSIS — M899 Disorder of bone, unspecified: Secondary | ICD-10-CM | POA: Diagnosis not present

## 2011-08-13 DIAGNOSIS — M949 Disorder of cartilage, unspecified: Secondary | ICD-10-CM | POA: Diagnosis not present

## 2011-08-14 DIAGNOSIS — M5030 Other cervical disc degeneration, unspecified cervical region: Secondary | ICD-10-CM | POA: Diagnosis not present

## 2011-08-14 DIAGNOSIS — M542 Cervicalgia: Secondary | ICD-10-CM | POA: Diagnosis not present

## 2011-08-14 DIAGNOSIS — M9981 Other biomechanical lesions of cervical region: Secondary | ICD-10-CM | POA: Diagnosis not present

## 2011-08-14 DIAGNOSIS — R51 Headache: Secondary | ICD-10-CM | POA: Diagnosis not present

## 2011-08-18 DIAGNOSIS — R51 Headache: Secondary | ICD-10-CM | POA: Diagnosis not present

## 2011-08-18 DIAGNOSIS — M5030 Other cervical disc degeneration, unspecified cervical region: Secondary | ICD-10-CM | POA: Diagnosis not present

## 2011-08-18 DIAGNOSIS — M9981 Other biomechanical lesions of cervical region: Secondary | ICD-10-CM | POA: Diagnosis not present

## 2011-08-18 DIAGNOSIS — M542 Cervicalgia: Secondary | ICD-10-CM | POA: Diagnosis not present

## 2011-08-20 DIAGNOSIS — R51 Headache: Secondary | ICD-10-CM | POA: Diagnosis not present

## 2011-08-20 DIAGNOSIS — M5030 Other cervical disc degeneration, unspecified cervical region: Secondary | ICD-10-CM | POA: Diagnosis not present

## 2011-08-20 DIAGNOSIS — M9981 Other biomechanical lesions of cervical region: Secondary | ICD-10-CM | POA: Diagnosis not present

## 2011-08-20 DIAGNOSIS — M542 Cervicalgia: Secondary | ICD-10-CM | POA: Diagnosis not present

## 2011-08-25 ENCOUNTER — Ambulatory Visit (INDEPENDENT_AMBULATORY_CARE_PROVIDER_SITE_OTHER): Payer: Medicare Other | Admitting: Sports Medicine

## 2011-08-25 VITALS — BP 126/77

## 2011-08-25 DIAGNOSIS — M9981 Other biomechanical lesions of cervical region: Secondary | ICD-10-CM | POA: Diagnosis not present

## 2011-08-25 DIAGNOSIS — M79609 Pain in unspecified limb: Secondary | ICD-10-CM

## 2011-08-25 DIAGNOSIS — M5030 Other cervical disc degeneration, unspecified cervical region: Secondary | ICD-10-CM | POA: Diagnosis not present

## 2011-08-25 DIAGNOSIS — M542 Cervicalgia: Secondary | ICD-10-CM | POA: Diagnosis not present

## 2011-08-25 DIAGNOSIS — M6789 Other specified disorders of synovium and tendon, multiple sites: Secondary | ICD-10-CM

## 2011-08-25 DIAGNOSIS — R51 Headache: Secondary | ICD-10-CM | POA: Diagnosis not present

## 2011-08-25 DIAGNOSIS — M79672 Pain in left foot: Secondary | ICD-10-CM

## 2011-08-25 DIAGNOSIS — M76829 Posterior tibial tendinitis, unspecified leg: Secondary | ICD-10-CM

## 2011-08-25 NOTE — Patient Instructions (Addendum)
Try the new insert with the navicular pad. Continue to ice your foot after exercise. Use Voltaren gel twice daily. Apply to areas of maximum tenderness. Drop off your x-rays for me to review. Follow up with me in 3 weeks for an ultrasound evaluation.

## 2011-08-25 NOTE — Progress Notes (Signed)
  Subjective:    Patient ID: [name], female    DOB: 1940/10/27, 71 y.o.   MRN: [id number]  HPI patient comes in today complaining of 6 months of left foot and ankle pain. No trauma that she can recall, but rather a gradual onset of pain. Most of the pain is along the medial aspect of the ankle and foot but at times does radiate into the dorsum of her foot. She notices intermittent swelling. Symptoms are worse with standing and prolonged walking. Improved at rest. 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 She denies any numbness or tingling. She was previously seen in this office and diagnosed with metatarsalgia, was given a metatarsal pad on a sports insole, but did not notice any symptom relief with this. She is most recently seen Dr. Victorino Dike who fitted her with a PTTD brace which she finds extremely uncomfortable. He ordered physical therapy but she has yet to attend. X-rays were apparently done at that office visit as well, but are not available for review. She takes 200 mg of Celebrex daily and was recently instructed to increase this dose to 400 mg daily, but she has reservations about this.    Review of Systems     Objective:   Physical Exam Patient is well-developed and well-nourished, in no acute distress. Examination of the left foot and ankle in the standing position shows a mild amount of hindfoot valgus. Mild amount of forefoot abduction. Patient has pain and weakness with standing on her tip toes on the left. She has a very flexible cavus foot excessive pronation with walking. She is tender to palpation along the course of the posterior tibial tendon both along the medial malleolus as well as distally at the navicular. She has no soft tissue swelling. Negative Tinel's at the tarsal tunnel. Full passive subtalar motion. She is walking with a slight left.       Assessment & Plan:  #1. 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 #2. Ankle pain secondary to #1.  We will try a scaphoid pad on a sports insole. Patient will apply Voltaren gel twice daily (she already has this at home). She will use ice intermittently. I've asked that she drop off her x-rays for my review. I will plan on reevaluating the patient in 3 weeks, likely with a muscular skeletal ultrasound. Patient may ultimately need a pair of custom orthotics. Call with questions or concerns in the interim.

## 2011-09-01 DIAGNOSIS — M542 Cervicalgia: Secondary | ICD-10-CM | POA: Diagnosis not present

## 2011-09-01 DIAGNOSIS — R51 Headache: Secondary | ICD-10-CM | POA: Diagnosis not present

## 2011-09-01 DIAGNOSIS — M5030 Other cervical disc degeneration, unspecified cervical region: Secondary | ICD-10-CM | POA: Diagnosis not present

## 2011-09-01 DIAGNOSIS — M9981 Other biomechanical lesions of cervical region: Secondary | ICD-10-CM | POA: Diagnosis not present

## 2011-09-03 DIAGNOSIS — M9981 Other biomechanical lesions of cervical region: Secondary | ICD-10-CM | POA: Diagnosis not present

## 2011-09-03 DIAGNOSIS — R51 Headache: Secondary | ICD-10-CM | POA: Diagnosis not present

## 2011-09-03 DIAGNOSIS — M542 Cervicalgia: Secondary | ICD-10-CM | POA: Diagnosis not present

## 2011-09-03 DIAGNOSIS — M5030 Other cervical disc degeneration, unspecified cervical region: Secondary | ICD-10-CM | POA: Diagnosis not present

## 2011-09-08 DIAGNOSIS — M5030 Other cervical disc degeneration, unspecified cervical region: Secondary | ICD-10-CM | POA: Diagnosis not present

## 2011-09-08 DIAGNOSIS — R51 Headache: Secondary | ICD-10-CM | POA: Diagnosis not present

## 2011-09-08 DIAGNOSIS — M9981 Other biomechanical lesions of cervical region: Secondary | ICD-10-CM | POA: Diagnosis not present

## 2011-09-08 DIAGNOSIS — M542 Cervicalgia: Secondary | ICD-10-CM | POA: Diagnosis not present

## 2011-09-15 ENCOUNTER — Ambulatory Visit (INDEPENDENT_AMBULATORY_CARE_PROVIDER_SITE_OTHER): Payer: Medicare Other | Admitting: Sports Medicine

## 2011-09-15 ENCOUNTER — Encounter: Payer: Self-pay | Admitting: Sports Medicine

## 2011-09-15 VITALS — BP 122/66 | HR 88

## 2011-09-15 DIAGNOSIS — M6789 Other specified disorders of synovium and tendon, multiple sites: Secondary | ICD-10-CM

## 2011-09-15 DIAGNOSIS — M542 Cervicalgia: Secondary | ICD-10-CM | POA: Diagnosis not present

## 2011-09-15 DIAGNOSIS — M9981 Other biomechanical lesions of cervical region: Secondary | ICD-10-CM | POA: Diagnosis not present

## 2011-09-15 DIAGNOSIS — M25579 Pain in unspecified ankle and joints of unspecified foot: Secondary | ICD-10-CM

## 2011-09-15 DIAGNOSIS — R51 Headache: Secondary | ICD-10-CM | POA: Diagnosis not present

## 2011-09-15 DIAGNOSIS — M76829 Posterior tibial tendinitis, unspecified leg: Secondary | ICD-10-CM

## 2011-09-15 DIAGNOSIS — M5030 Other cervical disc degeneration, unspecified cervical region: Secondary | ICD-10-CM | POA: Diagnosis not present

## 2011-09-15 MED ORDER — NITROGLYCERIN 0.2 MG/HR TD PT24: MEDICATED_PATCH | TRANSDERMAL | Status: DC

## 2011-09-15 NOTE — Progress Notes (Signed)
  Subjective:    Patient ID: [name], female    DOB: [birth year], 71 y.o.   MRN: [id number]  HPI Pt that comes today to f/u foot pain. She was here 08/25/11 symptomatic for 6 months prior to that visit. At that point was recommended scaphoid pad, topical Voltaren and ice. She hasn't worn the insoles b/c they don't fit on her regular shoes and she has not tried on tennis shoes. The Voltaren has not helped at all and she has use ice up to 2 hours every time with no relieve of symptoms. The pain is localized on metatarsal/navicular area of left foot that irradiate to the plantar side of foot. She reports edema in that area without erythema. The pain worsens with walking and is also painful at rest. Right foot: previously normal that for the past weeks has started to hurt in the medial plantar area. No edema or erythema. Pain exacerbates with walking. She also describes pain on the calf area of same foot that worsens when walking and alleviates with rest.  Review of Systems Per HPI    Objective:   Physical Exam Constitutional: Mild distressed due to pain and moderated limitation of function (walking) MSK: Left foot: Edema and tenderness to palpation of superior/inferior metatarsal-navicular area at the insertion of the posterior tibialis tendon.  Pain to foot inversion. Normal rest of ROM. Strength intact and no joint laxity. Moderate pes planus with standing and walking           Right foot: No edema or erythema, tender to palpation of plantar fascia insertion site. Normal ROM.  Negative Thompson test.   MSK ultrasound left ankle: Images were focused around the medial ankle. Both transverse and longitudinal images were obtained. There is thickening as well as hypoechogenicity within the substance of the posterior tibialis tendon both behind the medial malleolus as well as towards the insertion onto the navicular. Transverse views of the tendon near its insertion shows increased neovascularity as well  as hypoechoic areas within the tendon consistent with probable partial longitudinal tearing here.     Assessment & Plan:  1. 7 months of left foot pain secondary to posterior tibialis tendon tendinopathy/dysfunction 2. Early plantar fasciitis right heel  We need to find the patient a comfortable pair of orthotics. She admits that she has not been wearing her sports insoles and I've asked that she bring in her shoes tomorrow so that we can try to get a comfortable fit. This may be challenging given her pain over the navicular. Record to start her on a quarter patch of nitroglycerin 0.2 mg every 24 hours. She does have a history of migraine headaches and is cautioned about headache and will discontinue the patch if she begins to experience migraines. I would like to start an eccentric strengthening exercise program with her but I'm afraid she will tolerate it currently due to her level of pain. I'll see her back in 4 weeks.

## 2011-09-15 NOTE — Patient Instructions (Addendum)

## 2011-09-22 DIAGNOSIS — E038 Other specified hypothyroidism: Secondary | ICD-10-CM | POA: Diagnosis not present

## 2011-09-22 DIAGNOSIS — IMO0001 Reserved for inherently not codable concepts without codable children: Secondary | ICD-10-CM | POA: Diagnosis not present

## 2011-09-22 DIAGNOSIS — E559 Vitamin D deficiency, unspecified: Secondary | ICD-10-CM | POA: Diagnosis not present

## 2011-09-22 DIAGNOSIS — R635 Abnormal weight gain: Secondary | ICD-10-CM | POA: Diagnosis not present

## 2011-09-22 DIAGNOSIS — E785 Hyperlipidemia, unspecified: Secondary | ICD-10-CM | POA: Diagnosis not present

## 2011-10-13 ENCOUNTER — Ambulatory Visit: Payer: Medicare Other | Admitting: Sports Medicine

## 2011-10-22 DIAGNOSIS — E039 Hypothyroidism, unspecified: Secondary | ICD-10-CM | POA: Diagnosis not present

## 2011-10-28 DIAGNOSIS — M542 Cervicalgia: Secondary | ICD-10-CM | POA: Diagnosis not present

## 2011-10-28 DIAGNOSIS — M9981 Other biomechanical lesions of cervical region: Secondary | ICD-10-CM | POA: Diagnosis not present

## 2011-10-28 DIAGNOSIS — R51 Headache: Secondary | ICD-10-CM | POA: Diagnosis not present

## 2011-10-28 DIAGNOSIS — M5030 Other cervical disc degeneration, unspecified cervical region: Secondary | ICD-10-CM | POA: Diagnosis not present

## 2011-10-29 DIAGNOSIS — M9981 Other biomechanical lesions of cervical region: Secondary | ICD-10-CM | POA: Diagnosis not present

## 2011-10-29 DIAGNOSIS — M5030 Other cervical disc degeneration, unspecified cervical region: Secondary | ICD-10-CM | POA: Diagnosis not present

## 2011-10-29 DIAGNOSIS — R51 Headache: Secondary | ICD-10-CM | POA: Diagnosis not present

## 2011-10-29 DIAGNOSIS — M542 Cervicalgia: Secondary | ICD-10-CM | POA: Diagnosis not present

## 2011-10-30 DIAGNOSIS — M542 Cervicalgia: Secondary | ICD-10-CM | POA: Diagnosis not present

## 2011-10-30 DIAGNOSIS — R51 Headache: Secondary | ICD-10-CM | POA: Diagnosis not present

## 2011-10-30 DIAGNOSIS — M9981 Other biomechanical lesions of cervical region: Secondary | ICD-10-CM | POA: Diagnosis not present

## 2011-10-30 DIAGNOSIS — M5030 Other cervical disc degeneration, unspecified cervical region: Secondary | ICD-10-CM | POA: Diagnosis not present

## 2011-11-04 DIAGNOSIS — M542 Cervicalgia: Secondary | ICD-10-CM | POA: Diagnosis not present

## 2011-11-04 DIAGNOSIS — Z01419 Encounter for gynecological examination (general) (routine) without abnormal findings: Secondary | ICD-10-CM | POA: Diagnosis not present

## 2011-11-04 DIAGNOSIS — R51 Headache: Secondary | ICD-10-CM | POA: Diagnosis not present

## 2011-11-04 DIAGNOSIS — Z0000 Encounter for general adult medical examination without abnormal findings: Secondary | ICD-10-CM | POA: Diagnosis not present

## 2011-11-04 DIAGNOSIS — M9981 Other biomechanical lesions of cervical region: Secondary | ICD-10-CM | POA: Diagnosis not present

## 2011-11-04 DIAGNOSIS — M5030 Other cervical disc degeneration, unspecified cervical region: Secondary | ICD-10-CM | POA: Diagnosis not present

## 2011-11-04 DIAGNOSIS — Z124 Encounter for screening for malignant neoplasm of cervix: Secondary | ICD-10-CM | POA: Diagnosis not present

## 2011-11-04 LAB — HM PAP SMEAR

## 2011-11-20 DIAGNOSIS — M5030 Other cervical disc degeneration, unspecified cervical region: Secondary | ICD-10-CM | POA: Diagnosis not present

## 2011-11-20 DIAGNOSIS — M542 Cervicalgia: Secondary | ICD-10-CM | POA: Diagnosis not present

## 2011-11-20 DIAGNOSIS — M9981 Other biomechanical lesions of cervical region: Secondary | ICD-10-CM | POA: Diagnosis not present

## 2011-11-20 DIAGNOSIS — R51 Headache: Secondary | ICD-10-CM | POA: Diagnosis not present

## 2011-11-27 ENCOUNTER — Other Ambulatory Visit: Payer: Self-pay | Admitting: Obstetrics & Gynecology

## 2011-11-27 DIAGNOSIS — Z1231 Encounter for screening mammogram for malignant neoplasm of breast: Secondary | ICD-10-CM

## 2011-12-03 DIAGNOSIS — M9981 Other biomechanical lesions of cervical region: Secondary | ICD-10-CM | POA: Diagnosis not present

## 2011-12-03 DIAGNOSIS — M5030 Other cervical disc degeneration, unspecified cervical region: Secondary | ICD-10-CM | POA: Diagnosis not present

## 2011-12-03 DIAGNOSIS — R51 Headache: Secondary | ICD-10-CM | POA: Diagnosis not present

## 2011-12-03 DIAGNOSIS — M542 Cervicalgia: Secondary | ICD-10-CM | POA: Diagnosis not present

## 2011-12-09 ENCOUNTER — Ambulatory Visit
Admission: RE | Admit: 2011-12-09 | Discharge: 2011-12-09 | Disposition: A | Discharge status: Home / Self Care | Payer: Medicare Other | Source: Ambulatory Visit | Attending: Obstetrics & Gynecology | Admitting: Obstetrics & Gynecology

## 2011-12-09 DIAGNOSIS — Z1231 Encounter for screening mammogram for malignant neoplasm of breast: Secondary | ICD-10-CM

## 2011-12-15 DIAGNOSIS — IMO0001 Reserved for inherently not codable concepts without codable children: Secondary | ICD-10-CM | POA: Diagnosis not present

## 2011-12-15 DIAGNOSIS — E559 Vitamin D deficiency, unspecified: Secondary | ICD-10-CM | POA: Diagnosis not present

## 2011-12-15 DIAGNOSIS — N951 Menopausal and female climacteric states: Secondary | ICD-10-CM | POA: Diagnosis not present

## 2011-12-15 DIAGNOSIS — E038 Other specified hypothyroidism: Secondary | ICD-10-CM | POA: Diagnosis not present

## 2011-12-16 DIAGNOSIS — M1710 Unilateral primary osteoarthritis, unspecified knee: Secondary | ICD-10-CM | POA: Diagnosis not present

## 2011-12-16 DIAGNOSIS — IMO0002 Reserved for concepts with insufficient information to code with codable children: Secondary | ICD-10-CM | POA: Diagnosis not present

## 2012-01-20 DIAGNOSIS — M1710 Unilateral primary osteoarthritis, unspecified knee: Secondary | ICD-10-CM | POA: Diagnosis not present

## 2012-01-20 DIAGNOSIS — IMO0002 Reserved for concepts with insufficient information to code with codable children: Secondary | ICD-10-CM | POA: Diagnosis not present

## 2012-01-21 DIAGNOSIS — G43909 Migraine, unspecified, not intractable, without status migrainosus: Secondary | ICD-10-CM | POA: Diagnosis not present

## 2012-01-21 DIAGNOSIS — R0989 Other specified symptoms and signs involving the circulatory and respiratory systems: Secondary | ICD-10-CM | POA: Diagnosis not present

## 2012-01-21 DIAGNOSIS — G4710 Hypersomnia, unspecified: Secondary | ICD-10-CM | POA: Diagnosis not present

## 2012-01-21 DIAGNOSIS — M5412 Radiculopathy, cervical region: Secondary | ICD-10-CM | POA: Diagnosis not present

## 2012-01-21 DIAGNOSIS — R0609 Other forms of dyspnea: Secondary | ICD-10-CM | POA: Diagnosis not present

## 2012-01-27 DIAGNOSIS — R5381 Other malaise: Secondary | ICD-10-CM | POA: Diagnosis not present

## 2012-01-27 DIAGNOSIS — M1710 Unilateral primary osteoarthritis, unspecified knee: Secondary | ICD-10-CM | POA: Diagnosis not present

## 2012-01-27 DIAGNOSIS — IMO0002 Reserved for concepts with insufficient information to code with codable children: Secondary | ICD-10-CM | POA: Diagnosis not present

## 2012-01-27 DIAGNOSIS — R5383 Other fatigue: Secondary | ICD-10-CM | POA: Diagnosis not present

## 2012-01-29 DIAGNOSIS — G43909 Migraine, unspecified, not intractable, without status migrainosus: Secondary | ICD-10-CM | POA: Diagnosis not present

## 2012-01-29 DIAGNOSIS — G501 Atypical facial pain: Secondary | ICD-10-CM | POA: Diagnosis not present

## 2012-01-30 DIAGNOSIS — IMO0001 Reserved for inherently not codable concepts without codable children: Secondary | ICD-10-CM | POA: Diagnosis not present

## 2012-01-30 DIAGNOSIS — M25579 Pain in unspecified ankle and joints of unspecified foot: Secondary | ICD-10-CM | POA: Diagnosis not present

## 2012-01-30 DIAGNOSIS — M25569 Pain in unspecified knee: Secondary | ICD-10-CM | POA: Diagnosis not present

## 2012-02-03 DIAGNOSIS — M1710 Unilateral primary osteoarthritis, unspecified knee: Secondary | ICD-10-CM | POA: Diagnosis not present

## 2012-02-03 DIAGNOSIS — IMO0002 Reserved for concepts with insufficient information to code with codable children: Secondary | ICD-10-CM | POA: Diagnosis not present

## 2012-02-09 DIAGNOSIS — G4733 Obstructive sleep apnea (adult) (pediatric): Secondary | ICD-10-CM | POA: Diagnosis not present

## 2012-02-09 DIAGNOSIS — B373 Candidiasis of vulva and vagina: Secondary | ICD-10-CM | POA: Diagnosis not present

## 2012-02-11 HISTORY — PX: ROOT CANAL: SHX2363

## 2012-02-12 DIAGNOSIS — L821 Other seborrheic keratosis: Secondary | ICD-10-CM | POA: Diagnosis not present

## 2012-02-12 DIAGNOSIS — D1779 Benign lipomatous neoplasm of other sites: Secondary | ICD-10-CM | POA: Diagnosis not present

## 2012-02-12 DIAGNOSIS — L739 Follicular disorder, unspecified: Secondary | ICD-10-CM | POA: Diagnosis not present

## 2012-02-12 DIAGNOSIS — I781 Nevus, non-neoplastic: Secondary | ICD-10-CM | POA: Diagnosis not present

## 2012-02-12 DIAGNOSIS — L719 Rosacea, unspecified: Secondary | ICD-10-CM | POA: Diagnosis not present

## 2012-02-12 DIAGNOSIS — D239 Other benign neoplasm of skin, unspecified: Secondary | ICD-10-CM | POA: Diagnosis not present

## 2012-03-03 DIAGNOSIS — M9981 Other biomechanical lesions of cervical region: Secondary | ICD-10-CM | POA: Diagnosis not present

## 2012-03-03 DIAGNOSIS — R51 Headache: Secondary | ICD-10-CM | POA: Diagnosis not present

## 2012-03-03 DIAGNOSIS — M5030 Other cervical disc degeneration, unspecified cervical region: Secondary | ICD-10-CM | POA: Diagnosis not present

## 2012-03-03 DIAGNOSIS — M542 Cervicalgia: Secondary | ICD-10-CM | POA: Diagnosis not present

## 2012-03-04 DIAGNOSIS — M5030 Other cervical disc degeneration, unspecified cervical region: Secondary | ICD-10-CM | POA: Diagnosis not present

## 2012-03-04 DIAGNOSIS — M9981 Other biomechanical lesions of cervical region: Secondary | ICD-10-CM | POA: Diagnosis not present

## 2012-03-04 DIAGNOSIS — M542 Cervicalgia: Secondary | ICD-10-CM | POA: Diagnosis not present

## 2012-03-04 DIAGNOSIS — R51 Headache: Secondary | ICD-10-CM | POA: Diagnosis not present

## 2012-03-11 DIAGNOSIS — M5030 Other cervical disc degeneration, unspecified cervical region: Secondary | ICD-10-CM | POA: Diagnosis not present

## 2012-03-11 DIAGNOSIS — M9981 Other biomechanical lesions of cervical region: Secondary | ICD-10-CM | POA: Diagnosis not present

## 2012-03-11 DIAGNOSIS — R51 Headache: Secondary | ICD-10-CM | POA: Diagnosis not present

## 2012-03-11 DIAGNOSIS — M542 Cervicalgia: Secondary | ICD-10-CM | POA: Diagnosis not present

## 2012-03-17 DIAGNOSIS — G4730 Sleep apnea, unspecified: Secondary | ICD-10-CM | POA: Diagnosis not present

## 2012-03-17 DIAGNOSIS — E559 Vitamin D deficiency, unspecified: Secondary | ICD-10-CM | POA: Diagnosis not present

## 2012-03-17 DIAGNOSIS — E038 Other specified hypothyroidism: Secondary | ICD-10-CM | POA: Diagnosis not present

## 2012-03-17 DIAGNOSIS — E785 Hyperlipidemia, unspecified: Secondary | ICD-10-CM | POA: Diagnosis not present

## 2012-03-19 DIAGNOSIS — G4730 Sleep apnea, unspecified: Secondary | ICD-10-CM | POA: Diagnosis not present

## 2012-03-19 DIAGNOSIS — R0609 Other forms of dyspnea: Secondary | ICD-10-CM | POA: Diagnosis not present

## 2012-03-19 DIAGNOSIS — G4700 Insomnia, unspecified: Secondary | ICD-10-CM | POA: Diagnosis not present

## 2012-03-19 DIAGNOSIS — G501 Atypical facial pain: Secondary | ICD-10-CM | POA: Diagnosis not present

## 2012-03-19 DIAGNOSIS — G43909 Migraine, unspecified, not intractable, without status migrainosus: Secondary | ICD-10-CM | POA: Diagnosis not present

## 2012-03-19 DIAGNOSIS — R0989 Other specified symptoms and signs involving the circulatory and respiratory systems: Secondary | ICD-10-CM | POA: Diagnosis not present

## 2012-03-22 DIAGNOSIS — H903 Sensorineural hearing loss, bilateral: Secondary | ICD-10-CM | POA: Diagnosis not present

## 2012-03-22 DIAGNOSIS — H9209 Otalgia, unspecified ear: Secondary | ICD-10-CM | POA: Diagnosis not present

## 2012-03-23 ENCOUNTER — Other Ambulatory Visit: Payer: Self-pay | Admitting: Otolaryngology

## 2012-03-23 DIAGNOSIS — H903 Sensorineural hearing loss, bilateral: Secondary | ICD-10-CM

## 2012-03-23 DIAGNOSIS — H9209 Otalgia, unspecified ear: Secondary | ICD-10-CM

## 2012-03-29 ENCOUNTER — Ambulatory Visit
Admission: RE | Admit: 2012-03-29 | Discharge: 2012-03-29 | Disposition: A | Discharge status: Home / Self Care | Payer: Medicare Other | Source: Ambulatory Visit | Attending: Otolaryngology | Admitting: Otolaryngology

## 2012-03-29 DIAGNOSIS — H9209 Otalgia, unspecified ear: Secondary | ICD-10-CM | POA: Diagnosis not present

## 2012-03-29 DIAGNOSIS — H903 Sensorineural hearing loss, bilateral: Secondary | ICD-10-CM

## 2012-03-29 DIAGNOSIS — H905 Unspecified sensorineural hearing loss: Secondary | ICD-10-CM | POA: Diagnosis not present

## 2012-03-29 MED ORDER — IOHEXOL 300 MG/ML  SOLN
75.0000 mL | Freq: Once | INTRAMUSCULAR | Status: AC | PRN
  Administered 2012-03-29: 75 mL via INTRAVENOUS

## 2012-04-10 DIAGNOSIS — J069 Acute upper respiratory infection, unspecified: Secondary | ICD-10-CM | POA: Diagnosis not present

## 2012-04-15 DIAGNOSIS — IMO0002 Reserved for concepts with insufficient information to code with codable children: Secondary | ICD-10-CM | POA: Diagnosis not present

## 2012-04-15 DIAGNOSIS — M1710 Unilateral primary osteoarthritis, unspecified knee: Secondary | ICD-10-CM | POA: Diagnosis not present

## 2012-04-20 ENCOUNTER — Ambulatory Visit: Payer: Medicare Other | Admitting: Sports Medicine

## 2012-05-10 DIAGNOSIS — H43819 Vitreous degeneration, unspecified eye: Secondary | ICD-10-CM | POA: Diagnosis not present

## 2012-05-11 ENCOUNTER — Ambulatory Visit: Payer: Medicare Other | Admitting: Sports Medicine

## 2012-05-18 ENCOUNTER — Encounter: Payer: Self-pay | Admitting: Neurology

## 2012-05-20 DIAGNOSIS — R141 Gas pain: Secondary | ICD-10-CM | POA: Diagnosis not present

## 2012-05-20 DIAGNOSIS — R198 Other specified symptoms and signs involving the digestive system and abdomen: Secondary | ICD-10-CM | POA: Diagnosis not present

## 2012-05-20 DIAGNOSIS — Z86010 Personal history of colonic polyps: Secondary | ICD-10-CM | POA: Diagnosis not present

## 2012-05-24 DIAGNOSIS — R141 Gas pain: Secondary | ICD-10-CM | POA: Diagnosis not present

## 2012-05-24 DIAGNOSIS — R198 Other specified symptoms and signs involving the digestive system and abdomen: Secondary | ICD-10-CM | POA: Diagnosis not present

## 2012-05-24 DIAGNOSIS — R143 Flatulence: Secondary | ICD-10-CM | POA: Diagnosis not present

## 2012-06-22 DIAGNOSIS — R141 Gas pain: Secondary | ICD-10-CM | POA: Diagnosis not present

## 2012-06-22 DIAGNOSIS — R142 Eructation: Secondary | ICD-10-CM | POA: Diagnosis not present

## 2012-06-22 DIAGNOSIS — Z86010 Personal history of colonic polyps: Secondary | ICD-10-CM | POA: Diagnosis not present

## 2012-06-22 DIAGNOSIS — R143 Flatulence: Secondary | ICD-10-CM | POA: Diagnosis not present

## 2012-06-22 DIAGNOSIS — K589 Irritable bowel syndrome without diarrhea: Secondary | ICD-10-CM | POA: Diagnosis not present

## 2012-07-01 ENCOUNTER — Ambulatory Visit (INDEPENDENT_AMBULATORY_CARE_PROVIDER_SITE_OTHER): Payer: Medicare Other | Admitting: Nurse Practitioner

## 2012-07-01 ENCOUNTER — Encounter: Payer: Self-pay | Admitting: Nurse Practitioner

## 2012-07-01 VITALS — BP 126/74 | HR 72 | Resp 14 | Wt 193.4 lb

## 2012-07-01 DIAGNOSIS — B373 Candidiasis of vulva and vagina: Secondary | ICD-10-CM | POA: Diagnosis not present

## 2012-07-01 DIAGNOSIS — N342 Other urethritis: Secondary | ICD-10-CM | POA: Diagnosis not present

## 2012-07-01 MED ORDER — ESTRADIOL 10 MCG VA TABS: 1.0000 | ORAL_TABLET | VAGINAL | Status: DC

## 2012-07-01 MED ORDER — FLUCONAZOLE 150 MG PO TABS: 150.0000 mg | ORAL_TABLET | Freq: Once | ORAL | Status: DC

## 2012-07-01 NOTE — Progress Notes (Signed)
Subjective:     Patient ID: [name], female   DOB: 1940-10-01, 72 y.o.   MRN: [id number]  Patient seen by Dr. Loreta Ave and has been treated with a parasitic infection from travel to [place]. Took antibiotics from  April 14 X 14 days.  Went back for a recheck after that and ws OK.  She still has episodic bloating and abdominal pain.  Plans to recheck with Dr. Loreta Ave again.    Vaginal Discharge The patient's primary symptoms include genital itching and a vaginal discharge. Primary symptoms comment: Symptoms strted when she finished antibiotics.. This is a chronic problem. The current episode started in the past 7 days. The patient is experiencing no pain. She is not pregnant. Associated symptoms include diarrhea, nausea, painful intercourse and urgency. Pertinent negatives include no abdominal pain, back pain, chills, dysuria, fever, flank pain or frequency. Associated symptoms comments:  Also admits to being off Vagifem tablets since return from her trip.. The vaginal discharge was white, yellow and mucoid. The symptoms are aggravated by tactile pressure. She has tried antifungals (other than sitz bath. Took 1 Diflucan yesterday.) for the symptoms. She is sexually active. No, her partner does not have an STD. Contraceptive use: meopausal. She is postmenopausal.     Review of Systems  Constitutional: Negative.  Negative for fever, chills and fatigue.  Respiratory: Negative.   Cardiovascular: Negative.   Gastrointestinal: Positive for nausea, diarrhea and abdominal distention. Negative for abdominal pain.  Genitourinary: Positive for urgency and vaginal discharge. Negative for dysuria, frequency and flank pain.  Musculoskeletal: Positive for arthralgias. Negative for back pain.       History of flare of OA   Neurological: Negative.  Negative for dizziness, syncope and weakness.  Hematological: Negative.   Psychiatric/Behavioral: Negative.        Objective:   Physical Exam  Constitutional: She is  oriented to person, place, and time. She appears well-developed and well-nourished.  Cardiovascular: Normal rate.   Pulmonary/Chest: Effort normal.  Abdominal: Soft. She exhibits no distension. There is no tenderness. There is no rebound and no guarding.  Genitourinary:  Multiple areas of linear cuts consistent with chronic yeast. No vaginal discharge at this time. Wet prep is not done. Atrophic changes are present.  Neurological: She is alert and oriented to person, place, and time.  Psychiatric: She has a normal mood and affect. Her behavior is normal. Judgment and thought content normal.       Assessment:     History of chronic yeast acute and chronic Recent antibiotics for a parasitic infection    Plan:     Diflucan 150 mg weekly X 4 Samples and  refill on Vagifem 2  X week     Rx was faxed to [facility] as it would not go through.

## 2012-07-02 ENCOUNTER — Other Ambulatory Visit: Payer: Self-pay

## 2012-07-02 MED ORDER — ESZOPICLONE 3 MG PO TABS: 3.0000 mg | ORAL_TABLET | Freq: Every day | ORAL | Status: DC

## 2012-07-02 NOTE — Telephone Encounter (Signed)
Patient called requesting a refill on Lunesta be sent to [facility] for 90 days.

## 2012-07-05 ENCOUNTER — Telehealth: Payer: Self-pay | Admitting: Obstetrics & Gynecology

## 2012-07-05 NOTE — Telephone Encounter (Signed)
LEFT MESSAGE OF NEED TO RETURN CALL TO OUR OFFICE FOR MORE INFO ON MEDICATION DIFLUCAON.

## 2012-07-05 NOTE — Telephone Encounter (Signed)
[phone] with patients name and her id. Patient states she needs [place] to call [facility] and they will give her two brand name Diflucan but our office must call for authorization. Patient states she has extenuating circumstances due to taking antibiotics for the last month. Please advise.

## 2012-07-06 ENCOUNTER — Telehealth: Payer: Self-pay | Admitting: Obstetrics & Gynecology

## 2012-07-06 MED ORDER — FLUCONAZOLE 150 MG PO TABS: 150.0000 mg | ORAL_TABLET | Freq: Once | ORAL | Status: DC

## 2012-07-06 NOTE — Telephone Encounter (Signed)
PATIENT NOTIFIED OF MEDICATION DIFLUCON HAS BEEN SENT TO [facility] FOR BRAND NAME ONLY. LAST MONTH PATIENT GOT AT LOCAL PHARMACY GENERIC FLUCONOZOLE. NEW Rx SENT IN TODAY FOR BRAND NAME AND SHOULD BE COVERED BY [facility]. PATIENT STATES SHE IS NO BETTER. STILL WITH INFECTION. SUGGESTED AVENO BATH. PATIENT WILL DO THIS. PATIENT ANXIOUS AND UPSET THIS MORNING DUE TO THE DEATH OF A FRIEND LAST NITE.

## 2012-07-06 NOTE — Telephone Encounter (Signed)
[name], FNP., CHANGED MEDICAITON TO BRAND NAME ONLY AND SENT TO [facility]. SHE WILL CALL PATIENT FOR THIS AND ASK CONCERNING MEDICATION OF CARDIAC MED NITROGLYCERINE PATCH ALSO.

## 2012-07-06 NOTE — Telephone Encounter (Signed)
She returned Amy's call

## 2012-07-07 ENCOUNTER — Telehealth: Payer: Self-pay

## 2012-07-07 ENCOUNTER — Telehealth: Payer: Self-pay | Admitting: *Deleted

## 2012-07-07 MED ORDER — FLUCONAZOLE 150 MG PO TABS: 150.0000 mg | ORAL_TABLET | Freq: Once | ORAL | Status: DC

## 2012-07-07 NOTE — Progress Notes (Signed)
Encounter reviewed by Dr. Brook Silva.  

## 2012-07-07 NOTE — Telephone Encounter (Signed)
Patient called saying [name] is now available in generic, but she does not wish to try generic medications and wants a new rx sent for BMN.  I told her I will resend the original Rx and write Brand Name Only on it.  There is currently a prior auth on file for Lunesta.  I spoke with patient about the possibility of her insurance not covering the Brand Name in the future (when pa expires) if she has not tried and failed generic.  She verbalized understanding.  Said when her PA is no longer valid, perhaps a one month supply of generic could be sent in for her to try.  She will call back when the time comes for change if needed.

## 2012-07-07 NOTE — Telephone Encounter (Signed)
[facility] NOTIFIED OF QUANITY AND REFILLS. PATIENT NOTIFIED OF BRAND NAME WILL BE MORE COSTLY. PATIENT UNDERSTANDS THIS AND WILL PAY THE COST OF MEDICAITON. STATES GENERIC DOES NOT WORK FOR HER.  Rx SENT TO [facility] FOR QUANITY OF 6  AND 3 REFILLS FOR INSURANCE COVERAGE TO PAY FOR.

## 2012-07-13 DIAGNOSIS — H2510 Age-related nuclear cataract, unspecified eye: Secondary | ICD-10-CM | POA: Diagnosis not present

## 2012-07-13 DIAGNOSIS — H43819 Vitreous degeneration, unspecified eye: Secondary | ICD-10-CM | POA: Diagnosis not present

## 2012-07-14 ENCOUNTER — Encounter: Payer: Self-pay | Admitting: Gynecology

## 2012-07-14 ENCOUNTER — Ambulatory Visit (INDEPENDENT_AMBULATORY_CARE_PROVIDER_SITE_OTHER): Payer: Medicare Other | Admitting: Gynecology

## 2012-07-14 VITALS — BP 118/60 | Temp 98.4°F | Resp 14

## 2012-07-14 DIAGNOSIS — R3915 Urgency of urination: Secondary | ICD-10-CM

## 2012-07-14 DIAGNOSIS — N952 Postmenopausal atrophic vaginitis: Secondary | ICD-10-CM | POA: Diagnosis not present

## 2012-07-14 DIAGNOSIS — H43811 Vitreous degeneration, right eye: Secondary | ICD-10-CM | POA: Insufficient documentation

## 2012-07-14 DIAGNOSIS — H2510 Age-related nuclear cataract, unspecified eye: Secondary | ICD-10-CM | POA: Insufficient documentation

## 2012-07-14 DIAGNOSIS — Z8619 Personal history of other infectious and parasitic diseases: Secondary | ICD-10-CM | POA: Diagnosis not present

## 2012-07-14 LAB — POCT URINALYSIS DIPSTICK
Urobilinogen, UA: NEGATIVE
pH, UA: 5

## 2012-07-14 NOTE — Patient Instructions (Signed)
EVOO or cocoanut oil as vaginal lubicant, follow up with GI regarding bloating

## 2012-07-14 NOTE — Progress Notes (Signed)
Subjective:     Patient ID: [name], female   DOB: 1941-01-01, 72 y.o.   MRN: [id number]  HPI Comments: Pt presents complaining of urinary frequency and greenish vaginal discharge, pt was treated with diflucan qweek and has taken it twice, last time yesterday, she was restarted on vagifem for atrophic vaginitis and has been using it as directed.  Pt reports parasitic infection-giardia-flagyl 1500mg /d divided dose that begain in April and recent oral surgery for which she received antibitotics.  Pt was noted to have linear cuts on the perineum at her recent office visit.  Pt is prone to yeast infections. she does water aerobics 5-6d/week to rehab her knee and states that she changes into dry clothing immediately after swimming. Still has diarrhea and bloating since treatment with flagyl and is interested in referral to another GI. She also complains of pain with sex and dryness, she is using [employer] and feels it is insufficient.     Review of Systems Per HPi    Objective:   Physical Exam BP 118/60  Temp(Src) 98.4 F (36.9 C)  Resp 14 General appearance: alert, cooperative and appears stated age Abdomen: soft, nontender, no rebound or guarding  Pelvic: cervix normal in appearance, external genitalia normal, no adnexal masses or tenderness, no cervical motion tenderness and uterus normal size, shape, and consistency Scant discharge high in vault, vagina thin and smooth pH5.5    Assessment:     Dysuria by history-u/a unremarkable Recent antibiotics but treated with flagyl and diflucan Atrophic vaginitis GI bloating     Plan:     Wet prep deferred as recent antibiotics and diflucan would treat most vaginal infections  Recommend using boric acid capsules to improve the vaginal pH, instructed on how to make but prefers rx to be called in Recommend continuing vagifem, EVOO and cocoanut oil for vaginal dryness, ok to put on labia as well prn Refer to Dr Lottie Mussel regarding recent  giargia infection and follow up Urine for culture Length of time spent addressing issues above >25m Pt asked to return to office in 2-3w to see if sx improving with vaginal boric acid treatement

## 2012-07-15 ENCOUNTER — Telehealth: Payer: Self-pay | Admitting: *Deleted

## 2012-07-15 DIAGNOSIS — N952 Postmenopausal atrophic vaginitis: Secondary | ICD-10-CM | POA: Insufficient documentation

## 2012-07-15 DIAGNOSIS — Z8619 Personal history of other infectious and parasitic diseases: Secondary | ICD-10-CM | POA: Insufficient documentation

## 2012-07-15 NOTE — Telephone Encounter (Signed)
Returning a call to [name]. °

## 2012-07-15 NOTE — Telephone Encounter (Signed)
Left Message To Call Back Re: calling patient to let her know her rx for boric acid was called into [facility]0r[facility] she is to use it 2x  a week in the vagina; Dr. Farrel Gobble wants to see her in about 2-3 weeks for a recheck. Also would like me to apologize for her leaving the room quickly due to an emergency, if she is having any other issues she would be happy to discuss it with her @ her next ov.

## 2012-07-15 NOTE — Telephone Encounter (Signed)
S/W pt regarding rx being sent to [facility]. Also aware to use the boric acid 2x week; pt wanted to know if she could do water aerobic next week, per Dr. Farrel Gobble it should be fine as long as she uses the capsules after. Pt aware; apologized for Dr. Farrel Gobble leaving the room early due to an emergency pt, was appreciative for the time spent. Scheduled recheck for 08/02/12 @ 4:45

## 2012-07-16 LAB — URINE CULTURE
Colony Count: NO GROWTH
Organism ID, Bacteria: NO GROWTH

## 2012-07-21 ENCOUNTER — Telehealth: Payer: Self-pay | Admitting: *Deleted

## 2012-07-21 DIAGNOSIS — F329 Major depressive disorder, single episode, unspecified: Secondary | ICD-10-CM | POA: Diagnosis not present

## 2012-07-21 DIAGNOSIS — G479 Sleep disorder, unspecified: Secondary | ICD-10-CM | POA: Diagnosis not present

## 2012-07-21 NOTE — Telephone Encounter (Signed)
Spoke to [employer] . Stated provider would need to do appeals letter of medical necessity for brand name Diflucan and quantity. See chart in your office and paper work. sue

## 2012-07-22 ENCOUNTER — Encounter: Payer: Self-pay | Admitting: Nurse Practitioner

## 2012-07-22 DIAGNOSIS — M1710 Unilateral primary osteoarthritis, unspecified knee: Secondary | ICD-10-CM | POA: Diagnosis not present

## 2012-07-22 DIAGNOSIS — K589 Irritable bowel syndrome without diarrhea: Secondary | ICD-10-CM | POA: Diagnosis not present

## 2012-07-22 DIAGNOSIS — R143 Flatulence: Secondary | ICD-10-CM | POA: Diagnosis not present

## 2012-07-22 DIAGNOSIS — R197 Diarrhea, unspecified: Secondary | ICD-10-CM | POA: Diagnosis not present

## 2012-07-22 DIAGNOSIS — IMO0002 Reserved for concepts with insufficient information to code with codable children: Secondary | ICD-10-CM | POA: Diagnosis not present

## 2012-07-22 DIAGNOSIS — R141 Gas pain: Secondary | ICD-10-CM | POA: Diagnosis not present

## 2012-07-22 NOTE — Telephone Encounter (Signed)
Dr. Farrel Gobble look at letter and see if this is what you want.

## 2012-07-23 ENCOUNTER — Encounter: Payer: Self-pay | Admitting: Sports Medicine

## 2012-07-23 ENCOUNTER — Ambulatory Visit (INDEPENDENT_AMBULATORY_CARE_PROVIDER_SITE_OTHER): Payer: Medicare Other | Admitting: Gynecology

## 2012-07-23 VITALS — BP 134/60 | Temp 97.7°F | Resp 16

## 2012-07-23 DIAGNOSIS — Z803 Family history of malignant neoplasm of breast: Secondary | ICD-10-CM

## 2012-07-23 DIAGNOSIS — N952 Postmenopausal atrophic vaginitis: Secondary | ICD-10-CM

## 2012-07-23 DIAGNOSIS — R1310 Dysphagia, unspecified: Secondary | ICD-10-CM | POA: Diagnosis not present

## 2012-07-23 DIAGNOSIS — R3915 Urgency of urination: Secondary | ICD-10-CM

## 2012-07-23 LAB — POCT URINALYSIS DIPSTICK
Urobilinogen, UA: NEGATIVE
pH, UA: 5

## 2012-07-23 NOTE — Progress Notes (Signed)
Subjective:     Patient ID: [name], female   DOB: [birth year], 72 y.o.   MRN: [id number]  HPI Comments: Pt here for follow up, she has tried the boric acid and feels like it is helping, noticed redness and swelling of labia before taking boric acid, got a diflucan with improvement of symptoms.  Pt reports having difficulty swallowing on 3 separate occassions with last episode of emesis.  Pt reports increased urinary frequency and incomplete emptying for 2weeks.  Pt is having another knee replacement in October,  She just had 900mg  clindamycin for oral work. She also requests a breast exam which she does every 66m due to her mother having breast cancer, she does not feels anything, just an assurance. Pt also reports she set up an appointment with concierge MD to evaluate her for her recent onset of sensation of internal heat which started approx 2y ago-evaluation by Dr Evlyn Kanner negative, reports her thyroid is well controlled     Review of Systems     Objective:   Physical Exam  Constitutional: She appears well-developed and well-nourished.  Genitourinary: No breast swelling, tenderness (no mass, no skin changes, axilla negative), discharge or bleeding.  examined supine and upright Pelvic:  Postmenopausal vulvar changes Vagina: thin, smooth, no discharge, no bladder tenderness     Assessment:     Vaginitis-controlled odonophagia Breast exam New onset hesitency     Plan:     Will continue boric acid as previously prescribed Recommend pt see GI-had referred to Dr Lottie Mussel, will do so Normal breast exam-assured rx for DAW diflucan #4 with letter provided for Pt for upcoming surgery Informed recent UTI no growth, sx new onset, will resend can use OTC azo

## 2012-07-25 ENCOUNTER — Encounter: Payer: Self-pay | Admitting: Gynecology

## 2012-07-26 ENCOUNTER — Other Ambulatory Visit: Payer: Self-pay | Admitting: Gynecology

## 2012-07-26 ENCOUNTER — Encounter: Payer: Self-pay | Admitting: Gynecology

## 2012-07-26 DIAGNOSIS — Z299 Encounter for prophylactic measures, unspecified: Secondary | ICD-10-CM

## 2012-07-26 MED ORDER — FLUCONAZOLE 150 MG PO TABS: 150.0000 mg | ORAL_TABLET | Freq: Once | ORAL | Status: DC

## 2012-07-29 NOTE — Telephone Encounter (Signed)
FYI--Lacy calling from Dr. Jennye Boroughs office calling to say patient is going somewhere else but no sure where. [phone] if you have any questions.

## 2012-08-02 ENCOUNTER — Other Ambulatory Visit: Payer: Self-pay | Admitting: Gynecology

## 2012-08-02 ENCOUNTER — Ambulatory Visit (INDEPENDENT_AMBULATORY_CARE_PROVIDER_SITE_OTHER): Payer: Medicare Other | Admitting: Gynecology

## 2012-08-02 VITALS — BP 112/60 | Temp 97.9°F | Resp 18 | Ht 67.0 in | Wt 197.0 lb

## 2012-08-02 DIAGNOSIS — R3915 Urgency of urination: Secondary | ICD-10-CM | POA: Diagnosis not present

## 2012-08-02 DIAGNOSIS — N952 Postmenopausal atrophic vaginitis: Secondary | ICD-10-CM | POA: Diagnosis not present

## 2012-08-02 DIAGNOSIS — R350 Frequency of micturition: Secondary | ICD-10-CM

## 2012-08-02 LAB — POCT URINALYSIS DIPSTICK
Urobilinogen, UA: NEGATIVE
pH, UA: 5

## 2012-08-02 NOTE — Progress Notes (Signed)
Subjective:     Patient ID: [name], female   DOB: 1940-06-16, 72 y.o.   MRN: [id number]  HPI Comments: Pt for follow up after starting boric acid, went to water aerobics for the first time since starting. Pt also using vagifem twice a week. Overall is tolerating capsules well   Pt reports still with having urinary frequency-using water and cranberry capsules, reports ankles and feet swelling. Pt had left urine last week but culture not done,  Pt concerned as her Brother was just diagnosed with renal cell carcinoma-incidental finding during cholecystectomy Pt reports left sample for fecal testing at Dr [name], results pending.    Review of Systems Per HPI    Objective:   Physical Exam  Constitutional: She is oriented to person, place, and time. She appears well-developed and well-nourished.  Genitourinary: Vagina normal. There is no rash on the right labia. There is no rash on the left labia. Cervix exhibits no discharge. No erythema around the vagina. No vaginal discharge (pH 4.5) found.  Neurological: She is alert and oriented to person, place, and time.       Assessment:     Atrophic vaginits    Plan:     No evidence of vaginal infection, pt pleased with normalization of pH, con;t boric acid and vagifem Will send urine for culture, if normal will refer to urology-agreeable Will keep appt with internist at [facility] regarding thermal regulatory problems

## 2012-08-04 DIAGNOSIS — IMO0002 Reserved for concepts with insufficient information to code with codable children: Secondary | ICD-10-CM | POA: Diagnosis not present

## 2012-08-04 DIAGNOSIS — M1710 Unilateral primary osteoarthritis, unspecified knee: Secondary | ICD-10-CM | POA: Diagnosis not present

## 2012-08-04 LAB — URINE CULTURE: Colony Count: 9000

## 2012-08-05 ENCOUNTER — Encounter: Payer: Self-pay | Admitting: Gynecology

## 2012-08-11 DIAGNOSIS — IMO0002 Reserved for concepts with insufficient information to code with codable children: Secondary | ICD-10-CM | POA: Diagnosis not present

## 2012-08-11 DIAGNOSIS — M1710 Unilateral primary osteoarthritis, unspecified knee: Secondary | ICD-10-CM | POA: Diagnosis not present

## 2012-08-19 DIAGNOSIS — IMO0002 Reserved for concepts with insufficient information to code with codable children: Secondary | ICD-10-CM | POA: Diagnosis not present

## 2012-08-19 DIAGNOSIS — M1710 Unilateral primary osteoarthritis, unspecified knee: Secondary | ICD-10-CM | POA: Diagnosis not present

## 2012-08-30 DIAGNOSIS — R5381 Other malaise: Secondary | ICD-10-CM | POA: Diagnosis not present

## 2012-08-30 DIAGNOSIS — M1710 Unilateral primary osteoarthritis, unspecified knee: Secondary | ICD-10-CM | POA: Diagnosis not present

## 2012-08-30 DIAGNOSIS — IMO0001 Reserved for inherently not codable concepts without codable children: Secondary | ICD-10-CM | POA: Diagnosis not present

## 2012-08-30 DIAGNOSIS — M5030 Other cervical disc degeneration, unspecified cervical region: Secondary | ICD-10-CM | POA: Diagnosis not present

## 2012-08-31 DIAGNOSIS — D239 Other benign neoplasm of skin, unspecified: Secondary | ICD-10-CM | POA: Diagnosis not present

## 2012-08-31 DIAGNOSIS — L719 Rosacea, unspecified: Secondary | ICD-10-CM | POA: Diagnosis not present

## 2012-08-31 DIAGNOSIS — L821 Other seborrheic keratosis: Secondary | ICD-10-CM | POA: Diagnosis not present

## 2012-09-06 DIAGNOSIS — G4730 Sleep apnea, unspecified: Secondary | ICD-10-CM | POA: Diagnosis not present

## 2012-09-06 DIAGNOSIS — M899 Disorder of bone, unspecified: Secondary | ICD-10-CM | POA: Diagnosis not present

## 2012-09-06 DIAGNOSIS — Z6830 Body mass index (BMI) 30.0-30.9, adult: Secondary | ICD-10-CM | POA: Diagnosis not present

## 2012-09-06 DIAGNOSIS — E038 Other specified hypothyroidism: Secondary | ICD-10-CM | POA: Diagnosis not present

## 2012-09-06 DIAGNOSIS — E559 Vitamin D deficiency, unspecified: Secondary | ICD-10-CM | POA: Diagnosis not present

## 2012-09-06 DIAGNOSIS — E785 Hyperlipidemia, unspecified: Secondary | ICD-10-CM | POA: Diagnosis not present

## 2012-09-06 DIAGNOSIS — M949 Disorder of cartilage, unspecified: Secondary | ICD-10-CM | POA: Diagnosis not present

## 2012-09-06 DIAGNOSIS — IMO0001 Reserved for inherently not codable concepts without codable children: Secondary | ICD-10-CM | POA: Diagnosis not present

## 2012-09-29 ENCOUNTER — Other Ambulatory Visit: Payer: Self-pay

## 2012-09-30 DIAGNOSIS — M546 Pain in thoracic spine: Secondary | ICD-10-CM | POA: Diagnosis not present

## 2012-09-30 DIAGNOSIS — R51 Headache: Secondary | ICD-10-CM | POA: Diagnosis not present

## 2012-09-30 DIAGNOSIS — M9981 Other biomechanical lesions of cervical region: Secondary | ICD-10-CM | POA: Diagnosis not present

## 2012-09-30 DIAGNOSIS — M999 Biomechanical lesion, unspecified: Secondary | ICD-10-CM | POA: Diagnosis not present

## 2012-10-02 ENCOUNTER — Other Ambulatory Visit: Payer: Self-pay | Admitting: Obstetrics & Gynecology

## 2012-10-02 ENCOUNTER — Other Ambulatory Visit: Payer: Self-pay | Admitting: Gynecology

## 2012-10-04 DIAGNOSIS — M9981 Other biomechanical lesions of cervical region: Secondary | ICD-10-CM | POA: Diagnosis not present

## 2012-10-04 DIAGNOSIS — M999 Biomechanical lesion, unspecified: Secondary | ICD-10-CM | POA: Diagnosis not present

## 2012-10-04 DIAGNOSIS — R51 Headache: Secondary | ICD-10-CM | POA: Diagnosis not present

## 2012-10-04 DIAGNOSIS — M546 Pain in thoracic spine: Secondary | ICD-10-CM | POA: Diagnosis not present

## 2012-10-04 NOTE — Telephone Encounter (Signed)
This is from the pharmacy

## 2012-10-04 NOTE — Telephone Encounter (Signed)
Please advise Diflucan #2/0rf's was [facility] 07/26/12.

## 2012-10-04 NOTE — Telephone Encounter (Signed)
Is she requesting diflucan for upcomig knee surgery or is this just from pharmacy?

## 2012-10-04 NOTE — Telephone Encounter (Signed)
Per Dr. Farrel Gobble pt can have #2/0rf's  #2/0rf's [facility].

## 2012-10-07 DIAGNOSIS — M999 Biomechanical lesion, unspecified: Secondary | ICD-10-CM | POA: Diagnosis not present

## 2012-10-07 DIAGNOSIS — M9981 Other biomechanical lesions of cervical region: Secondary | ICD-10-CM | POA: Diagnosis not present

## 2012-10-07 DIAGNOSIS — R51 Headache: Secondary | ICD-10-CM | POA: Diagnosis not present

## 2012-10-07 DIAGNOSIS — M546 Pain in thoracic spine: Secondary | ICD-10-CM | POA: Diagnosis not present

## 2012-10-08 DIAGNOSIS — M1710 Unilateral primary osteoarthritis, unspecified knee: Secondary | ICD-10-CM | POA: Diagnosis not present

## 2012-10-08 DIAGNOSIS — IMO0002 Reserved for concepts with insufficient information to code with codable children: Secondary | ICD-10-CM | POA: Diagnosis not present

## 2012-10-11 ENCOUNTER — Telehealth: Payer: Self-pay | Admitting: Gynecology

## 2012-10-11 NOTE — Telephone Encounter (Signed)
Patient is requesting that you call in 2 Diflucan because she is having her Right knee replace Sept 15. And will be taking a lot of antibiotics.

## 2012-10-11 NOTE — Telephone Encounter (Signed)
Would you like to call in diflucan for pt?

## 2012-10-12 ENCOUNTER — Encounter: Payer: Self-pay | Admitting: Gynecology

## 2012-10-12 NOTE — Telephone Encounter (Signed)
Should have been sent in already. Sent 8/9

## 2012-10-12 NOTE — Telephone Encounter (Signed)
Called to patient to let her know Rx for Diflucan had been ordered on 10/02/2012 per Dr. Farrel Gobble. Patient stated she had not received it yet.  Called [facility] to check on order. Was told medication was shipped out yesterday and would take 2-3 days to get to patient address./ Patient notified of this.

## 2012-10-15 ENCOUNTER — Telehealth: Payer: Self-pay | Admitting: Neurology

## 2012-10-15 NOTE — Telephone Encounter (Signed)
All info has been faxed to the insurance company.  Pending their response.

## 2012-10-26 ENCOUNTER — Telehealth: Payer: Self-pay

## 2012-10-26 ENCOUNTER — Other Ambulatory Visit: Payer: Self-pay | Admitting: Orthopedic Surgery

## 2012-10-26 NOTE — Telephone Encounter (Signed)
[facility] faxed [place] a letter of approval for Lunesta, effective 10/20/2012-10/22/2013 Case # [id number].  They indicate they have also notified the patient.

## 2012-10-27 DIAGNOSIS — Z23 Encounter for immunization: Secondary | ICD-10-CM | POA: Diagnosis not present

## 2012-10-27 DIAGNOSIS — IMO0002 Reserved for concepts with insufficient information to code with codable children: Secondary | ICD-10-CM | POA: Diagnosis not present

## 2012-10-27 DIAGNOSIS — M1710 Unilateral primary osteoarthritis, unspecified knee: Secondary | ICD-10-CM | POA: Diagnosis not present

## 2012-10-28 ENCOUNTER — Encounter (HOSPITAL_COMMUNITY): Payer: Self-pay | Admitting: Pharmacy Technician

## 2012-11-02 ENCOUNTER — Other Ambulatory Visit: Payer: Self-pay

## 2012-11-02 ENCOUNTER — Other Ambulatory Visit (HOSPITAL_COMMUNITY): Payer: Self-pay | Admitting: Orthopedic Surgery

## 2012-11-02 ENCOUNTER — Encounter (HOSPITAL_COMMUNITY): Payer: Self-pay

## 2012-11-02 ENCOUNTER — Encounter (HOSPITAL_COMMUNITY)
Admission: RE | Admit: 2012-11-02 | Discharge: 2012-11-02 | Disposition: A | Discharge status: Home / Self Care | Payer: Medicare Other | Source: Ambulatory Visit | Attending: Orthopedic Surgery | Admitting: Orthopedic Surgery

## 2012-11-02 DIAGNOSIS — Z01812 Encounter for preprocedural laboratory examination: Secondary | ICD-10-CM | POA: Insufficient documentation

## 2012-11-02 DIAGNOSIS — M1710 Unilateral primary osteoarthritis, unspecified knee: Secondary | ICD-10-CM | POA: Insufficient documentation

## 2012-11-02 DIAGNOSIS — Z01810 Encounter for preprocedural cardiovascular examination: Secondary | ICD-10-CM | POA: Insufficient documentation

## 2012-11-02 HISTORY — DX: Constipation, unspecified: K59.00

## 2012-11-02 HISTORY — DX: Pain, unspecified: R52

## 2012-11-02 HISTORY — DX: Scarlet fever, uncomplicated: A38.9

## 2012-11-02 HISTORY — DX: Unspecified hearing loss, unspecified ear: H91.90

## 2012-11-02 HISTORY — DX: Reserved for inherently not codable concepts without codable children: IMO0001

## 2012-11-02 LAB — URINALYSIS, ROUTINE W REFLEX MICROSCOPIC
Bilirubin Urine: NEGATIVE
Glucose, UA: NEGATIVE mg/dL
Hgb urine dipstick: NEGATIVE
Ketones, ur: NEGATIVE mg/dL
Nitrite: NEGATIVE
Protein, ur: NEGATIVE mg/dL
Specific Gravity, Urine: 1.011 (ref 1.005–1.030)
Urobilinogen, UA: 0.2 mg/dL (ref 0.0–1.0)
pH: 7.5 (ref 5.0–8.0)

## 2012-11-02 LAB — URINE MICROSCOPIC-ADD ON

## 2012-11-02 LAB — COMPREHENSIVE METABOLIC PANEL
ALT: 17 U/L (ref 0–35)
AST: 18 U/L (ref 0–37)
Albumin: 3.7 g/dL (ref 3.5–5.2)
Alkaline Phosphatase: 78 U/L (ref 39–117)
BUN: 13 mg/dL (ref 6–23)
CO2: 28 mEq/L (ref 19–32)
Calcium: 9.5 mg/dL (ref 8.4–10.5)
Chloride: 103 mEq/L (ref 96–112)
Creatinine, Ser: 0.5 mg/dL (ref 0.50–1.10)
GFR calc Af Amer: >90 mL/min (ref >90)
GFR calc non Af Amer: >90 mL/min (ref >90)
Glucose, Bld: 111 mg/dL — ABNORMAL HIGH (ref 70–99)
Potassium: 4.2 mEq/L (ref 3.5–5.1)
Sodium: 138 mEq/L (ref 135–145)
Total Bilirubin: 0.2 mg/dL — ABNORMAL LOW (ref 0.3–1.2)
Total Protein: 6.9 g/dL (ref 6.0–8.3)

## 2012-11-02 LAB — PROTIME-INR
INR: 0.95 (ref 0.00–1.49)
Prothrombin Time: 12.5 seconds (ref 11.6–15.2)

## 2012-11-02 LAB — APTT: aPTT: 26 seconds (ref 24–37)

## 2012-11-02 LAB — CBC
HCT: 41.6 % (ref 36.0–46.0)
Hemoglobin: 14 g/dL (ref 12.0–15.0)
MCH: 29.4 pg (ref 26.0–34.0)
MCHC: 33.7 g/dL (ref 30.0–36.0)
MCV: 87.4 fL (ref 78.0–100.0)
Platelets: 242 10*3/uL (ref 150–400)
RBC: 4.76 MIL/uL (ref 3.87–5.11)
RDW: 12.9 % (ref 11.5–15.5)
WBC: 6.6 10*3/uL (ref 4.0–10.5)

## 2012-11-02 LAB — SURGICAL PCR SCREEN
MRSA, PCR: NEGATIVE
Staphylococcus aureus: NEGATIVE

## 2012-11-02 NOTE — Progress Notes (Signed)
Called vicky or scheduling to post on or schedule,  pt prefers anesthelogist to put her to sleep

## 2012-11-02 NOTE — Patient Instructions (Addendum)
20 [name]  11/02/2012   Your procedure is scheduled on: 11-08-2012  Report to [facility] at  515 AM.  Call this number if you have problems the morning of surgery [phone]   Remember:   Do not eat food or drink liquids :After Midnight.     Take these medicines the morning of surgery with A SIP OF WATER: lexapro, armour thryoid                                SEE [facility] PREPARING FOR SURGERY SHEET   Do not wear jewelry, make-up.  Do not wear lotions, powders, or perfumes. You may wear deodorant.   Men may shave face and neck.  Do not bring valuables to the hospital. [facility] IS NOT RESPONSIBLE FOR VALUEABLES.  Contacts, dentures or bridgework may not be worn into surgery.  Leave suitcase in the car. After surgery it may be brought to your room.  For patients admitted to the hospital, checkout time is 11:00 AM the day of discharge.   Patients discharged the day of surgery will not be allowed to drive home.  Name and phone number of your driver:  Special Instructions: N/A   Please read over the following fact sheets that you were given: MRSA INFORMATION, BLOOD FACT SHEET, INCENTIVE SPIROMETER FACT SHEET  Call Cain Sieve RN pre op nurse if needed [phone]    FAILURE TO FOLLOW THESE INSTRUCTIONS MAY RESULT IN THE CANCELLATION OF YOUR SURGERY.  PATIENT SIGNATURE___________________________________________  NURSE SIGNATURE_____________________________________________

## 2012-11-02 NOTE — Progress Notes (Signed)
Pt c/o left under breast pain, ekg done at pre op visit 11-02-2012

## 2012-11-03 NOTE — Progress Notes (Signed)
Micro ua results faxed to dr aluisio by epic 

## 2012-11-07 ENCOUNTER — Other Ambulatory Visit: Payer: Self-pay | Admitting: Orthopedic Surgery

## 2012-11-07 NOTE — H&P (Signed)
[name]  DOB: 01/09/1941 Married / Language: English / Race: White Female  Date of Admission:  11/08/2012  Chief Complaint:  Right knee pain  History of Present Illness The patient is a 72 year old female who comes in for a preoperative History and Physical. The patient is scheduled for a right total knee arthroplasty to be performed by Dr. Frank V. Aluisio, MD at [facility] on 11/08/2012. The patient is a 72 year old female who presents for follow up of their knee. The patient is being followed for their right knee pain and osteoarthritis. They are now several months out from Synvisc series (that did provide relief, but is starting to wear off). Symptoms reported today include: pain (that increases with weightbearing), pain at night (for the past 2 months), swelling and stiffness (and warmth). The following medication has been used for pain control: antiinflammatory medication (Celebrex). [name] comes back in several months from her most recent Synvisc series. She said the injections have helped. She has had some increasing pain with her knee though. She knows she's going to have to have a knee replacement at some point. She's been trying to do water aerobics, but recently she's been out of those exercises for some other issues. She has noticed some swelling. When it's more swollen, it feels warmer to her. She's been having some popping and it feels like it is "out of alignment". There's been some buckling but no catching with it. She's been on Celebrex for many years and takes 200 mg. daily per Dr. Deveshwar. She is now at a point where she is ready to get the knee replaced. They have been treated conservatively in the past for the above stated problem and despite conservative measures, they continue to have progressive pain and severe functional limitations and dysfunction. They have failed non-operative management including home exercise, medications, and  injections. It is felt that they would benefit from undergoing total joint replacement. Risks and benefits of the procedure have been discussed with the patient and they elect to proceed with surgery. There are no active contraindications to surgery such as ongoing infection or rapidly progressive neurological disease.  Problem List S/P total knee arthroplasty Osteoarthritis of right knee (715.96)   Allergies Penicillin VK *PENICILLINS*. Childhood - whelps with her mouth Codeine Phosphate *ANALGESICS - OPIOID*. Nausea.   Family History Osteoarthritis. mother Hypertension. mother and brother Cancer. mother and brother   Social History Alcohol use. current drinker; only occasionally per week Children. 0 Current work status. retired Drug/Alcohol Rehab (Currently). no Drug/Alcohol Rehab (Previously). no Exercise. Exercises weekly Illicit drug use. no Living situation. live with spouse Marital status. married Number of flights of stairs before winded. 2-3 Tobacco / smoke exposure. yes Tobacco use. never smoker Advance Directives. Living Will   Medication History Lexapro (20MG Tablet, Oral) Active. CeleBREX (200MG Capsule, Oral) Active. Melatonin ( Oral) Specific dose unknown - Active. Aspirin EC (81MG Tablet DR, Oral) Active. Lunesta (3MG Tablet, Oral) Active. Cyclobenzaprine HCl (10MG Tablet, Oral) Active. Unisom PM Pain (50-325MG Tablet, Oral) Active. Armour Thyroid (60MG Tablet, Oral) Active.   Past Surgical History Tonsillectomy Total Knee Replacement. Date: 08/2005. left Tubal Ligation Breast Biopsy. left   Medical History Sleep Apnea. uses CPAP Fibromyalgia Hypothyroidism Migraine Headache Osteoarthritis Impaired Hearing Scarlet Fever. Past History   Review of Systems General:Present- Night Sweats and Fatigue. Not Present- Chills, Fever, Weight Gain, Weight Loss and Memory Loss. Skin:Not Present- Hives, Itching, Rash,  Eczema and Lesions. HEENT:Not Present- Tinnitus,   Headache, Double Vision, Visual Loss, Hearing Loss and Dentures. Respiratory:Not Present- Shortness of breath with exertion, Shortness of breath at rest, Allergies, Coughing up blood and Chronic Cough. Cardiovascular:Not Present- Chest Pain, Racing/skipping heartbeats, Difficulty Breathing Lying Down, Murmur, Swelling and Palpitations. Gastrointestinal:Present- Constipation. Not Present- Bloody Stool, Heartburn, Abdominal Pain, Vomiting, Nausea, Diarrhea, Difficulty Swallowing, Jaundice and Loss of appetitie. Female Genitourinary:Not Present- Blood in Urine, Urinary frequency, Weak urinary stream, Discharge, Flank Pain, Incontinence, Painful Urination, Urgency, Urinary Retention and Urinating at Night. Musculoskeletal:Present- Joint Swelling, Joint Pain, Back Pain and Morning Stiffness. Not Present- Muscle Weakness, Muscle Pain and Spasms. Neurological:Not Present- Tremor, Dizziness, Blackout spells, Paralysis, Difficulty with balance and Weakness. Psychiatric:Not Present- Insomnia.   Vitals Weight: 184 lb Height: 66.5 in Weight was reported by patient. Height was reported by patient. Body Surface Area: 1.98 m Body Mass Index: 29.25 kg/m Pulse: 84 (Regular) Resp.: 147 (Unlabored) BP: 124/74 (Sitting, Right Arm, Standard)    Physical Exam The physical exam findings are as follows:  Note: Patient is a 72 year old female with continued knee pain.   General Mental Status - Alert, cooperative and good historian. General Appearance- pleasant. Not in acute distress. Orientation- Oriented X3. Build & Nutrition- Well nourished and Well developed.   Head and Neck Head- normocephalic, atraumatic . Neck Global Assessment- supple. no bruit auscultated on the right and no bruit auscultated on the left.   Eye Vision- Wears contact lenses (Right eye contact). Pupil- Bilateral- Regular and Round. Motion-  Bilateral- EOMI.   Chest and Lung Exam Auscultation: Breath sounds:- clear at anterior chest wall and - clear at posterior chest wall. Adventitious sounds:- No Adventitious sounds.   Cardiovascular Auscultation:Rhythm- Regular rate and rhythm. Heart Sounds- S1 WNL and S2 WNL. Murmurs & Other Heart Sounds:Auscultation of the heart reveals - No Murmurs.   Abdomen Palpation/Percussion:Tenderness- Abdomen is non-tender to palpation. Rigidity (guarding)- Abdomen is soft. Auscultation:Auscultation of the abdomen reveals - Bowel sounds normal.   Female Genitourinary  Not done, not pertinent to present illness  Musculoskeletal On exam, 72 YO female, well nourished, well developed, in no acute distress. Right knee is examined. She has full extension with flexion back to 120-125 degrees active and passive. She has mild to moderate crepitus, patellofemoral. I do not appreciate any intra-articular effusions. She's nontender over her quad tendon and patellar ligament. Extensor mechanism is intact. Her medial and collateral ligaments are stable with varus/valgus stressing. She is tender to palpation over the medial joint line.  RADIOGRAPHS: AP view, standing of both knees and lateral of the right knee are taken. One single view of the left knee shows the total knee prosthesis in good alignment. The right knee on AP view and lateral view confirms bone on bone in the medial compartment with also some patellofemoral spurring. These were reviewed and compared back to films back in 2013, when she was near bone on bone showing that she has had some progression of her arthritis in the medial compartment.  Assessment & Plan Osteoarthritis of right knee (715.96) Current Plans l Pt Education - How to access health information online: discussed with patient and provided information.  Note: Plan is for a Right Total Knee Replacement by Dr. Aluisio.  Plan is to go  home.  PCP - Dr. C. Alan Ross  The patient does not have any contraindications and will recieve TXA (tranexamic acid) prior to surgery.  Time spent ~ 30 minutes  Signed electronically by Keionna Kinnaird L Aberdeen Hafen, III PA-C 

## 2012-11-08 ENCOUNTER — Encounter (HOSPITAL_COMMUNITY)
Admission: RE | Disposition: A | Discharge status: Home Health | Payer: Self-pay | Source: Ambulatory Visit | Attending: Orthopedic Surgery

## 2012-11-08 ENCOUNTER — Encounter (HOSPITAL_COMMUNITY): Payer: Self-pay | Admitting: Anesthesiology

## 2012-11-08 ENCOUNTER — Inpatient Hospital Stay (HOSPITAL_COMMUNITY): Payer: Medicare Other | Admitting: Anesthesiology

## 2012-11-08 ENCOUNTER — Inpatient Hospital Stay (HOSPITAL_COMMUNITY)
Admission: RE | Admit: 2012-11-08 | Discharge: 2012-11-10 | DRG: 470 | Disposition: A | Discharge status: Home Health | Payer: Medicare Other | Source: Ambulatory Visit | Attending: Orthopedic Surgery | Admitting: Orthopedic Surgery

## 2012-11-08 DIAGNOSIS — IMO0001 Reserved for inherently not codable concepts without codable children: Secondary | ICD-10-CM | POA: Diagnosis present

## 2012-11-08 DIAGNOSIS — M1710 Unilateral primary osteoarthritis, unspecified knee: Secondary | ICD-10-CM | POA: Diagnosis present

## 2012-11-08 DIAGNOSIS — H9190 Unspecified hearing loss, unspecified ear: Secondary | ICD-10-CM | POA: Diagnosis present

## 2012-11-08 DIAGNOSIS — G4730 Sleep apnea, unspecified: Secondary | ICD-10-CM | POA: Diagnosis present

## 2012-11-08 DIAGNOSIS — Z7982 Long term (current) use of aspirin: Secondary | ICD-10-CM

## 2012-11-08 DIAGNOSIS — Z01810 Encounter for preprocedural cardiovascular examination: Secondary | ICD-10-CM

## 2012-11-08 DIAGNOSIS — D62 Acute posthemorrhagic anemia: Secondary | ICD-10-CM | POA: Diagnosis not present

## 2012-11-08 DIAGNOSIS — E039 Hypothyroidism, unspecified: Secondary | ICD-10-CM | POA: Diagnosis not present

## 2012-11-08 DIAGNOSIS — Z79899 Other long term (current) drug therapy: Secondary | ICD-10-CM | POA: Diagnosis not present

## 2012-11-08 DIAGNOSIS — Z01812 Encounter for preprocedural laboratory examination: Secondary | ICD-10-CM | POA: Diagnosis not present

## 2012-11-08 DIAGNOSIS — M25569 Pain in unspecified knee: Secondary | ICD-10-CM | POA: Diagnosis not present

## 2012-11-08 DIAGNOSIS — IMO0002 Reserved for concepts with insufficient information to code with codable children: Secondary | ICD-10-CM | POA: Diagnosis not present

## 2012-11-08 HISTORY — PX: TOTAL KNEE ARTHROPLASTY: SHX125

## 2012-11-08 LAB — TYPE AND SCREEN
ABO/RH(D): O POS
Antibody Screen: NEGATIVE

## 2012-11-08 SURGERY — ARTHROPLASTY, KNEE, TOTAL
Anesthesia: General | Site: Knee | Laterality: Right | Wound class: Clean

## 2012-11-08 MED ORDER — ESCITALOPRAM OXALATE 20 MG PO TABS
30.0000 mg | ORAL_TABLET | Freq: Every day | ORAL | Status: DC
  Administered 2012-11-09 – 2012-11-10 (×2): 30 mg via ORAL
  Filled 2012-11-08 (×2): qty 1

## 2012-11-08 MED ORDER — METHOCARBAMOL 100 MG/ML IJ SOLN
500.0000 mg | Freq: Four times a day (QID) | INTRAMUSCULAR | Status: DC | PRN
  Administered 2012-11-08: 500 mg via INTRAVENOUS
  Filled 2012-11-08: qty 5

## 2012-11-08 MED ORDER — DEXAMETHASONE SODIUM PHOSPHATE 10 MG/ML IJ SOLN
10.0000 mg | Freq: Once | INTRAMUSCULAR | Status: AC
  Administered 2012-11-08: 10 mg via INTRAVENOUS

## 2012-11-08 MED ORDER — OXYCODONE HCL 5 MG PO TABS
5.0000 mg | ORAL_TABLET | ORAL | Status: DC | PRN
  Administered 2012-11-08 (×2): 10 mg via ORAL
  Administered 2012-11-08 (×2): 5 mg via ORAL
  Administered 2012-11-09 – 2012-11-10 (×8): 10 mg via ORAL
  Filled 2012-11-08 (×5): qty 2
  Filled 2012-11-08: qty 1
  Filled 2012-11-08: qty 2
  Filled 2012-11-08: qty 1
  Filled 2012-11-08 (×4): qty 2

## 2012-11-08 MED ORDER — CYCLOBENZAPRINE HCL 10 MG PO TABS
10.0000 mg | ORAL_TABLET | Freq: Every day | ORAL | Status: DC
  Administered 2012-11-08 – 2012-11-10 (×2): 10 mg via ORAL
  Filled 2012-11-08 (×3): qty 1

## 2012-11-08 MED ORDER — POLYETHYLENE GLYCOL 3350 17 G PO PACK: 17.0000 g | PACK | Freq: Every day | ORAL | Status: DC | PRN

## 2012-11-08 MED ORDER — BISACODYL 10 MG RE SUPP: 10.0000 mg | Freq: Every day | RECTAL | Status: DC | PRN

## 2012-11-08 MED ORDER — SODIUM CHLORIDE 0.9 % IJ SOLN
INTRAMUSCULAR | Status: DC | PRN
  Administered 2012-11-08: 08:00:00

## 2012-11-08 MED ORDER — TRAMADOL HCL 50 MG PO TABS: 50.0000 mg | ORAL_TABLET | Freq: Four times a day (QID) | ORAL | Status: DC | PRN

## 2012-11-08 MED ORDER — MIDAZOLAM HCL 5 MG/5ML IJ SOLN
INTRAMUSCULAR | Status: DC | PRN
  Administered 2012-11-08: 2 mg via INTRAVENOUS

## 2012-11-08 MED ORDER — BUPIVACAINE HCL 0.25 % IJ SOLN
INTRAMUSCULAR | Status: DC | PRN
  Administered 2012-11-08: 20 mL

## 2012-11-08 MED ORDER — DIPHENHYDRAMINE HCL 12.5 MG/5ML PO ELIX
12.5000 mg | ORAL_SOLUTION | ORAL | Status: DC | PRN
  Administered 2012-11-09 (×2): 25 mg via ORAL
  Filled 2012-11-08 (×2): qty 10

## 2012-11-08 MED ORDER — MORPHINE SULFATE 2 MG/ML IJ SOLN
1.0000 mg | INTRAMUSCULAR | Status: DC | PRN
  Administered 2012-11-09: 1 mg via INTRAVENOUS
  Filled 2012-11-08 (×3): qty 1

## 2012-11-08 MED ORDER — ACETAMINOPHEN 500 MG PO TABS
1000.0000 mg | ORAL_TABLET | Freq: Once | ORAL | Status: AC
  Administered 2012-11-08: 1000 mg via ORAL
  Filled 2012-11-08: qty 2

## 2012-11-08 MED ORDER — ZOLPIDEM TARTRATE 5 MG PO TABS
5.0000 mg | ORAL_TABLET | Freq: Every evening | ORAL | Status: DC | PRN
  Administered 2012-11-09: 5 mg via ORAL
  Filled 2012-11-08: qty 1

## 2012-11-08 MED ORDER — 0.9 % SODIUM CHLORIDE (POUR BTL) OPTIME
TOPICAL | Status: DC | PRN
  Administered 2012-11-08: 1000 mL

## 2012-11-08 MED ORDER — SODIUM CHLORIDE 0.9 % IV SOLN
INTRAVENOUS | Status: DC
  Administered 2012-11-08 (×2): via INTRAVENOUS

## 2012-11-08 MED ORDER — HYDROMORPHONE HCL PF 1 MG/ML IJ SOLN
0.2500 mg | INTRAMUSCULAR | Status: DC | PRN
  Administered 2012-11-08 (×2): 0.5 mg via INTRAVENOUS

## 2012-11-08 MED ORDER — LIDOCAINE HCL (CARDIAC) 20 MG/ML IV SOLN
INTRAVENOUS | Status: DC | PRN
  Administered 2012-11-08: 50 mg via INTRAVENOUS

## 2012-11-08 MED ORDER — LACTATED RINGERS IV SOLN
INTRAVENOUS | Status: DC | PRN
  Administered 2012-11-08 (×2): via INTRAVENOUS

## 2012-11-08 MED ORDER — KETOROLAC TROMETHAMINE 15 MG/ML IJ SOLN
7.5000 mg | Freq: Four times a day (QID) | INTRAMUSCULAR | Status: AC | PRN
  Filled 2012-11-08: qty 1

## 2012-11-08 MED ORDER — METHOCARBAMOL 500 MG PO TABS
500.0000 mg | ORAL_TABLET | Freq: Four times a day (QID) | ORAL | Status: DC | PRN
  Administered 2012-11-09 – 2012-11-10 (×3): 500 mg via ORAL
  Filled 2012-11-08 (×4): qty 1

## 2012-11-08 MED ORDER — RIVAROXABAN 10 MG PO TABS
10.0000 mg | ORAL_TABLET | Freq: Every day | ORAL | Status: DC
  Administered 2012-11-09 – 2012-11-10 (×2): 10 mg via ORAL
  Filled 2012-11-08 (×3): qty 1

## 2012-11-08 MED ORDER — VALACYCLOVIR HCL 500 MG PO TABS
500.0000 mg | ORAL_TABLET | Freq: Every day | ORAL | Status: DC
  Administered 2012-11-08 – 2012-11-10 (×3): 500 mg via ORAL
  Filled 2012-11-08 (×3): qty 1

## 2012-11-08 MED ORDER — VANCOMYCIN HCL IN DEXTROSE 1-5 GM/200ML-% IV SOLN
1000.0000 mg | Freq: Two times a day (BID) | INTRAVENOUS | Status: AC
  Administered 2012-11-08: 1000 mg via INTRAVENOUS
  Filled 2012-11-08: qty 200

## 2012-11-08 MED ORDER — SODIUM CHLORIDE 0.9 % IJ SOLN
INTRAMUSCULAR | Status: AC
  Filled 2012-11-08: qty 50

## 2012-11-08 MED ORDER — FENTANYL CITRATE 0.05 MG/ML IJ SOLN
INTRAMUSCULAR | Status: DC | PRN
  Administered 2012-11-08 (×3): 100 ug via INTRAVENOUS

## 2012-11-08 MED ORDER — HYDROMORPHONE HCL PF 1 MG/ML IJ SOLN
INTRAMUSCULAR | Status: DC | PRN
  Administered 2012-11-08 (×2): 1 mg via INTRAVENOUS

## 2012-11-08 MED ORDER — POLYETHYLENE GLYCOL 3350 17 G PO PACK
17.0000 g | PACK | Freq: Two times a day (BID) | ORAL | Status: DC
  Administered 2012-11-08 – 2012-11-10 (×3): 17 g via ORAL

## 2012-11-08 MED ORDER — ONDANSETRON HCL 4 MG PO TABS: 4.0000 mg | ORAL_TABLET | Freq: Four times a day (QID) | ORAL | Status: DC | PRN

## 2012-11-08 MED ORDER — METOCLOPRAMIDE HCL 10 MG PO TABS: 5.0000 mg | ORAL_TABLET | Freq: Three times a day (TID) | ORAL | Status: DC | PRN

## 2012-11-08 MED ORDER — METOCLOPRAMIDE HCL 5 MG/ML IJ SOLN: 5.0000 mg | Freq: Three times a day (TID) | INTRAMUSCULAR | Status: DC | PRN

## 2012-11-08 MED ORDER — SUCCINYLCHOLINE CHLORIDE 20 MG/ML IJ SOLN
INTRAMUSCULAR | Status: DC | PRN
  Administered 2012-11-08: 100 mg via INTRAVENOUS

## 2012-11-08 MED ORDER — SODIUM CHLORIDE 0.9 % IV SOLN: INTRAVENOUS | Status: DC

## 2012-11-08 MED ORDER — PROPOFOL 10 MG/ML IV BOLUS
INTRAVENOUS | Status: DC | PRN
  Administered 2012-11-08: 160 mg via INTRAVENOUS

## 2012-11-08 MED ORDER — ONDANSETRON HCL 4 MG/2ML IJ SOLN: 4.0000 mg | Freq: Four times a day (QID) | INTRAMUSCULAR | Status: DC | PRN

## 2012-11-08 MED ORDER — BUPIVACAINE HCL (PF) 0.25 % IJ SOLN
INTRAMUSCULAR | Status: AC
  Filled 2012-11-08: qty 30

## 2012-11-08 MED ORDER — DEXAMETHASONE 6 MG PO TABS
10.0000 mg | ORAL_TABLET | Freq: Every day | ORAL | Status: AC
  Administered 2012-11-09: 10 mg via ORAL
  Filled 2012-11-08: qty 1

## 2012-11-08 MED ORDER — PROMETHAZINE HCL 25 MG/ML IJ SOLN: 6.2500 mg | INTRAMUSCULAR | Status: DC | PRN

## 2012-11-08 MED ORDER — DOCUSATE SODIUM 100 MG PO CAPS
100.0000 mg | ORAL_CAPSULE | Freq: Two times a day (BID) | ORAL | Status: DC
Start: 2012-11-08 — End: 2012-11-10
  Administered 2012-11-08 – 2012-11-10 (×4): 100 mg via ORAL

## 2012-11-08 MED ORDER — HYDROMORPHONE HCL PF 1 MG/ML IJ SOLN
INTRAMUSCULAR | Status: AC
  Filled 2012-11-08: qty 1

## 2012-11-08 MED ORDER — ONDANSETRON HCL 4 MG/2ML IJ SOLN
INTRAMUSCULAR | Status: DC | PRN
  Administered 2012-11-08: 4 mg via INTRAVENOUS

## 2012-11-08 MED ORDER — THYROID 60 MG PO TABS
180.0000 mg | ORAL_TABLET | ORAL | Status: DC
  Administered 2012-11-10: 180 mg via ORAL
  Filled 2012-11-08: qty 1

## 2012-11-08 MED ORDER — STERILE WATER FOR IRRIGATION IR SOLN
Status: DC | PRN
  Administered 2012-11-08: 3000 mL

## 2012-11-08 MED ORDER — ACETAMINOPHEN 500 MG PO TABS
1000.0000 mg | ORAL_TABLET | Freq: Four times a day (QID) | ORAL | Status: AC
  Administered 2012-11-08 – 2012-11-09 (×3): 1000 mg via ORAL
  Filled 2012-11-08 (×3): qty 2

## 2012-11-08 MED ORDER — TRANEXAMIC ACID 100 MG/ML IV SOLN
1000.0000 mg | INTRAVENOUS | Status: AC
  Administered 2012-11-08: 1000 mg via INTRAVENOUS
  Filled 2012-11-08: qty 10

## 2012-11-08 MED ORDER — DEXAMETHASONE SODIUM PHOSPHATE 10 MG/ML IJ SOLN
10.0000 mg | Freq: Every day | INTRAMUSCULAR | Status: AC
  Filled 2012-11-08: qty 1

## 2012-11-08 MED ORDER — FLEET ENEMA 7-19 GM/118ML RE ENEM: 1.0000 | ENEMA | Freq: Once | RECTAL | Status: AC | PRN

## 2012-11-08 MED ORDER — BUPIVACAINE LIPOSOME 1.3 % IJ SUSP
20.0000 mL | Freq: Once | INTRAMUSCULAR | Status: DC
  Filled 2012-11-08: qty 20

## 2012-11-08 MED ORDER — VANCOMYCIN HCL IN DEXTROSE 1-5 GM/200ML-% IV SOLN
1000.0000 mg | INTRAVENOUS | Status: AC
  Administered 2012-11-08: 1000 mg via INTRAVENOUS

## 2012-11-08 MED ORDER — VANCOMYCIN HCL IN DEXTROSE 1-5 GM/200ML-% IV SOLN
INTRAVENOUS | Status: AC
  Filled 2012-11-08: qty 200

## 2012-11-08 MED ORDER — MENTHOL 3 MG MT LOZG: 1.0000 | LOZENGE | OROMUCOSAL | Status: DC | PRN

## 2012-11-08 MED ORDER — SODIUM CHLORIDE 0.9 % IR SOLN
Status: DC | PRN
  Administered 2012-11-08: 1000 mL

## 2012-11-08 MED ORDER — THYROID 120 MG PO TABS
120.0000 mg | ORAL_TABLET | ORAL | Status: DC
  Administered 2012-11-09: 120 mg via ORAL
  Filled 2012-11-08 (×2): qty 1

## 2012-11-08 MED ORDER — PHENOL 1.4 % MT LIQD: 1.0000 | OROMUCOSAL | Status: DC | PRN

## 2012-11-08 SURGICAL SUPPLY — 57 items
BAG SPEC THK2 15X12 ZIP CLS (MISCELLANEOUS) ×1
BAG ZIPLOCK 12X15 (MISCELLANEOUS) ×2 IMPLANT
BANDAGE ELASTIC 6 VELCRO ST LF (GAUZE/BANDAGES/DRESSINGS) ×2 IMPLANT
BANDAGE ESMARK 6X9 LF (GAUZE/BANDAGES/DRESSINGS) ×1 IMPLANT
BLADE SAG 18X100X1.27 (BLADE) ×2 IMPLANT
BLADE SAW SGTL 11.0X1.19X90.0M (BLADE) ×2 IMPLANT
BNDG CMPR 9X6 STRL LF SNTH (GAUZE/BANDAGES/DRESSINGS) ×1
BNDG ESMARK 6X9 LF (GAUZE/BANDAGES/DRESSINGS) ×2
BOWL SMART MIX CTS (DISPOSABLE) ×2 IMPLANT
CAPT RP KNEE ×2 IMPLANT
CEMENT HV SMART SET (Cement) ×4 IMPLANT
CLOTH BEACON ORANGE TIMEOUT ST (SAFETY) ×2 IMPLANT
CUFF TOURN SGL QUICK 34 (TOURNIQUET CUFF) ×2
CUFF TRNQT CYL 34X4X40X1 (TOURNIQUET CUFF) ×1 IMPLANT
DECANTER SPIKE VIAL GLASS SM (MISCELLANEOUS) ×2 IMPLANT
DRAPE EXTREMITY T 121X128X90 (DRAPE) ×2 IMPLANT
DRAPE POUCH INSTRU U-SHP 10X18 (DRAPES) ×2 IMPLANT
DRAPE U-SHAPE 47X51 STRL (DRAPES) ×2 IMPLANT
DRSG ADAPTIC 3X8 NADH LF (GAUZE/BANDAGES/DRESSINGS) ×2 IMPLANT
DRSG PAD ABDOMINAL 8X10 ST (GAUZE/BANDAGES/DRESSINGS) ×2 IMPLANT
DURAPREP 26ML APPLICATOR (WOUND CARE) ×2 IMPLANT
ELECT REM PT RETURN 9FT ADLT (ELECTROSURGICAL) ×2
ELECTRODE REM PT RTRN 9FT ADLT (ELECTROSURGICAL) ×1 IMPLANT
EVACUATOR 1/8 PVC DRAIN (DRAIN) ×2 IMPLANT
FACESHIELD LNG OPTICON STERILE (SAFETY) ×10 IMPLANT
GLOVE BIO SURGEON STRL SZ7.5 (GLOVE) IMPLANT
GLOVE BIO SURGEON STRL SZ8 (GLOVE) ×2 IMPLANT
GLOVE BIOGEL PI IND STRL 8 (GLOVE) ×2 IMPLANT
GLOVE BIOGEL PI INDICATOR 8 (GLOVE) ×2
GLOVE SURG SS PI 6.5 STRL IVOR (GLOVE) IMPLANT
GOWN PREVENTION PLUS LG XLONG (DISPOSABLE) ×2 IMPLANT
GOWN STRL REIN XL XLG (GOWN DISPOSABLE) IMPLANT
HANDPIECE INTERPULSE COAX TIP (DISPOSABLE) ×2
IMMOBILIZER KNEE 20 (SOFTGOODS) ×2
IMMOBILIZER KNEE 20 THIGH 36 (SOFTGOODS) ×1 IMPLANT
KIT BASIN OR (CUSTOM PROCEDURE TRAY) ×2 IMPLANT
MANIFOLD NEPTUNE II (INSTRUMENTS) ×2 IMPLANT
NDL SAFETY ECLIPSE 18X1.5 (NEEDLE) ×2 IMPLANT
NEEDLE HYPO 18GX1.5 SHARP (NEEDLE) ×4
NS IRRIG 1000ML POUR BTL (IV SOLUTION) ×2 IMPLANT
PACK TOTAL JOINT (CUSTOM PROCEDURE TRAY) ×2 IMPLANT
PADDING CAST COTTON 6X4 STRL (CAST SUPPLIES) ×4 IMPLANT
POSITIONER SURGICAL ARM (MISCELLANEOUS) ×2 IMPLANT
SET HNDPC FAN SPRY TIP SCT (DISPOSABLE) ×1 IMPLANT
SPONGE GAUZE 4X4 12PLY (GAUZE/BANDAGES/DRESSINGS) ×2 IMPLANT
STRIP CLOSURE SKIN 1/2X4 (GAUZE/BANDAGES/DRESSINGS) ×2 IMPLANT
SUCTION FRAZIER 12FR DISP (SUCTIONS) ×2 IMPLANT
SUT MNCRL AB 4-0 PS2 18 (SUTURE) ×2 IMPLANT
SUT VIC AB 2-0 CT1 27 (SUTURE) ×6
SUT VIC AB 2-0 CT1 TAPERPNT 27 (SUTURE) ×3 IMPLANT
SUT VLOC 180 0 24IN GS25 (SUTURE) ×2 IMPLANT
SYR 20CC LL (SYRINGE) ×2 IMPLANT
SYR 50ML LL SCALE MARK (SYRINGE) ×2 IMPLANT
TOWEL OR 17X26 10 PK STRL BLUE (TOWEL DISPOSABLE) ×4 IMPLANT
TRAY FOLEY CATH 14FRSI W/METER (CATHETERS) ×2 IMPLANT
WATER STERILE IRR 1500ML POUR (IV SOLUTION) ×2 IMPLANT
WRAP KNEE MAXI GEL POST OP (GAUZE/BANDAGES/DRESSINGS) ×2 IMPLANT

## 2012-11-08 NOTE — Anesthesia Procedure Notes (Signed)
Procedure Name: Intubation Date/Time: 11/08/2012 7:21 AM Performed by: [place], Violette Morneault C Pre-anesthesia Checklist: Patient identified, Emergency Drugs available, Suction available, Patient being monitored and Timeout performed Patient Re-evaluated:Patient Re-evaluated prior to inductionOxygen Delivery Method: Circle system utilized Preoxygenation: Pre-oxygenation with 100% oxygen Intubation Type: Combination inhalational/ intravenous induction Ventilation: Mask ventilation without difficulty and Oral airway inserted - appropriate to patient size Tube type: Glide rite Tube size: 7.0 mm Number of attempts: 2 Airway Equipment and Method: Video-laryngoscopy and Rigid stylet Placement Confirmation: ETT inserted through vocal cords under direct vision,  positive ETCO2,  CO2 detector and breath sounds checked- equal and bilateral Secured at: 20 cm Tube secured with: Tape Difficulty Due To: Difficult Airway- due to anterior larynx Comments: Intubation by Anesthesiologist

## 2012-11-08 NOTE — Progress Notes (Addendum)
RT set patient up with CPAP machine.  CPAP pressure set at 10 CMH20 which is her home pressures.  Patient is using her home mask and tubing.  Patient put on to ensure that our machine was tolerable.  Patient stated that she would place herself back on when she was ready for bed.  RT informed her that if she needed any assistance to let RN know and RT would come back to assist.  RT will continue to monitor.

## 2012-11-08 NOTE — Interval H&P Note (Signed)
History and Physical Interval Note:  11/08/2012 6:41 AM  [name]  has presented today for surgery, with the diagnosis of OSTEOARTHRITIS RIGHT KNEE  The various methods of treatment have been discussed with the patient and family. After consideration of risks, benefits and other options for treatment, the patient has consented to  Procedure(s): RIGHT TOTAL KNEE ARTHROPLASTY (Right) as a surgical intervention .  The patient's history has been reviewed, patient examined, no change in status, stable for surgery.  I have reviewed the patient's chart and labs.  Questions were answered to the patient's satisfaction.     Loanne Drilling

## 2012-11-08 NOTE — Op Note (Signed)
Pre-operative diagnosis- Osteoarthritis  Right knee(s)  Post-operative diagnosis- Osteoarthritis Right knee(s)  Procedure-  Right  Total Knee Arthroplasty  Surgeon- Gus Rankin. Zaahir Pickney, MD  Assistant- Dimitri Ped, PA-C   Anesthesia-  General EBL-* No blood loss amount entered *  Drains Hemovac  Tourniquet time-  Total Tourniquet Time Documented: Thigh (Right) - 32 minutes Total: Thigh (Right) - 32 minutes    Complications- None  Condition-PACU - hemodynamically stable.   Brief Clinical Note  [name] is a 72 y.o. year old female with end stage OA of her right knee with progressively worsening pain and dysfunction. She has constant pain, with activity and at rest and significant functional deficits with difficulties even with ADLs. She has had extensive non-op management including analgesics, injections of cortisone and viscosupplements, and home exercise program, but remains in significant pain with significant dysfunction.Radiographs show bone on bone arthritis medial and patellofemoral. She presents now for right Total Knee Arthroplasty.    Procedure in detail---   The patient is brought into the operating room and positioned supine on the operating table. After successful administration of  General,   a tourniquet is placed high on the  Right thigh(s) and the lower extremity is prepped and draped in the usual sterile fashion. Time out is performed by the operating team and then the right lower extremity is wrapped in Esmarch, knee flexed and the tourniquet inflated to 300 mmHg.       A midline incision is made with a ten blade through the subcutaneous tissue to the level of the extensor mechanism. A fresh blade is used to make a medial parapatellar arthrotomy. Soft tissue over the proximal medial tibia is subperiosteally elevated to the joint line with a knife and into the semimembranosus bursa with a Cobb elevator. Soft tissue over the proximal lateral tibia is elevated with  attention being paid to avoiding the patellar tendon on the tibial tubercle. The patella is everted, knee flexed 90 degrees and the ACL and PCL are removed. Findings are bone on bone medial and patellofemoral with large medial osteophytes.        The drill is used to create a starting hole in the distal femur and the canal is thoroughly irrigated with sterile saline to remove the fatty contents. The 5 degree Right  valgus alignment guide is placed into the femoral canal and the distal femoral cutting block is pinned to remove 10 mm off the distal femur. Resection is made with an oscillating saw.      The tibia is subluxed forward and the menisci are removed. The extramedullary alignment guide is placed referencing proximally at the medial aspect of the tibial tubercle and distally along the second metatarsal axis and tibial crest. The block is pinned to remove 2mm off the more deficient medial  side. Resection is made with an oscillating saw. Size 3is the most appropriate size for the tibia and the proximal tibia is prepared with the modular drill and keel punch for that size.      The femoral sizing guide is placed and size 3 is most appropriate. Rotation is marked off the epicondylar axis and confirmed by creating a rectangular flexion gap at 90 degrees. The size 3 cutting block is pinned in this rotation and the anterior, posterior and chamfer cuts are made with the oscillating saw. The intercondylar block is then placed and that cut is made.      Trial size 3 tibial component, trial size 3 posterior stabilized  femur and a 10  mm posterior stabilized rotating platform insert trial is placed. Full extension is achieved with excellent varus/valgus and anterior/posterior balance throughout full range of motion. The patella is everted and thickness measured to be 22  mm. Free hand resection is taken to 12 mm, a 35 template is placed, lug holes are drilled, trial patella is placed, and it tracks normally.  Osteophytes are removed off the posterior femur with the trial in place. All trials are removed and the cut bone surfaces prepared with pulsatile lavage. Cement is mixed and once ready for implantation, the size 3 tibial implant, size  3 posterior stabilized femoral component, and the size 35 patella are cemented in place and the patella is held with the clamp. The trial insert is placed and the knee held in full extension. The Exparel (20 ml mixed with 30 ml saline) and .25% Bupivicaine, are injected into the extensor mechanism, posterior capsule, medial and lateral gutters and subcutaneous tissues.  All extruded cement is removed and once the cement is hard the permanent 10 mm posterior stabilized rotating platform insert is placed into the tibial tray.      The wound is copiously irrigated with saline solution and the extensor mechanism closed over a hemovac drain with #1 PDS suture. The tourniquet is released for a total tourniquet time of 32  minutes. Flexion against gravity is 140 degrees and the patella tracks normally. Subcutaneous tissue is closed with 2.0 vicryl and subcuticular with running 4.0 Monocryl. The incision is cleaned and dried and steri-strips and a bulky sterile dressing are applied. The limb is placed into a knee immobilizer and the patient is awakened and transported to recovery in stable condition.      Please note that a surgical assistant was a medical necessity for this procedure in order to perform it in a safe and expeditious manner. Surgical assistant was necessary to retract the ligaments and vital neurovascular structures to prevent injury to them and also necessary for proper positioning of the limb to allow for anatomic placement of the prosthesis.   Gus Rankin Dacoda Finlay, MD    11/08/2012, 8:18 AM

## 2012-11-08 NOTE — Evaluation (Signed)
Physical Therapy Evaluation Patient Details Name: [name] MRN: [id number] DOB: Jun 11, 1940 Today's Date: 11/08/2012 Time:  -     PT Assessment / Plan / Recommendation History of Present Illness  The patient is a 72 year old female who comes in for a preoperative History and Physical. The patient is scheduled for a right total knee arthroplasty to be performed by Dr. Gus Rankin. Aluisio, MD at [facility] on 11/08/2012.  Clinical Impression   Pt with RTKA presents with decreased strength, ROM and decreased mobility to benefit from PT to increase mobility to MOD I and assist PRN .    PT Assessment  Patient needs continued PT services    Follow Up Recommendations  Home health PT    Does the patient have the potential to tolerate intense rehabilitation      Barriers to Discharge        Equipment Recommendations  None recommended by PT (pt has equipment)    Recommendations for Other Services     Frequency 7X/week    Precautions / Restrictions Precautions Precautions: Knee Required Braces or Orthoses: Knee Immobilizer - Right Knee Immobilizer - Right: Discontinue once straight leg raise with < 10 degree lag Restrictions Weight Bearing Restrictions: No RLE Weight Bearing: Weight bearing as tolerated   Pertinent Vitals/Pain 3/10      Mobility  Bed Mobility Bed Mobility: Supine to Sit;Sit to Supine Supine to Sit: 4: Min assist Sit to Supine: Not Tested (comment) Details for Bed Mobility Assistance: cues and assist for sequencing anfd for LE and scooting Transfers Transfers: Sit to Stand;Stand to Sit Sit to Stand: 4: Min assist;With upper extremity assist Stand to Sit: 4: Min assist;With upper extremity assist Details for Transfer Assistance: cues for sequencing with UEs and LEs and use of rW Ambulation/Gait Ambulation/Gait Assistance: 4: Min assist Assistive device: Rolling walker Gait Pattern: Step-to pattern Gait velocity: slow    Exercises Total Joint  Exercises Ankle Circles/Pumps: AROM;Right;10 reps Quad Sets: Right;Supine;5 reps;AROM Heel Slides: AAROM;Right;5 reps;Supine   PT Diagnosis: Difficulty walking  PT Problem List: Decreased strength;Decreased range of motion;Decreased activity tolerance;Decreased mobility;Decreased knowledge of use of DME PT Treatment Interventions: DME instruction;Gait training;Stair training;Functional mobility training;Therapeutic activities;Therapeutic exercise;Patient/family education     PT Goals(Current goals can be found in the care plan section) Acute Rehab PT Goals Patient Stated Goal: To get better and go home when able PT Goal Formulation: With patient Time For Goal Achievement: 11/22/12 Potential to Achieve Goals: Good  Visit Information  Assistance Needed: +1 History of Present Illness: The patient is a 72 year old female who comes in for a preoperative History and Physical. The patient is scheduled for a right total knee arthroplasty to be performed by Dr. Gus Rankin. Aluisio, MD at [facility] on 11/08/2012.       Prior Functioning  Home Living Family/patient expects to be discharged to:: Private residence Living Arrangements: Spouse/significant other Available Help at Discharge: Family Type of Home: House Home Access: Stairs to enter Secretary/administrator of Steps: 2 Home Layout: One level Home Equipment: Environmental consultant - 2 wheels;Bedside commode Additional Comments: pt would like to check to see if hers is at the right height Prior Function Level of Independence: Independent Comments: drives, indepedent, enjoys water aerobics for exercise Communication Communication: No difficulties    Cognition  Cognition Arousal/Alertness: Awake/alert Behavior During Therapy: WFL for tasks assessed/performed Overall Cognitive Status: Within Functional Limits for tasks assessed    Extremity/Trunk Assessment Lower Extremity Assessment RLE Deficits /  Details: limited by pain and banadage  from p/o  RLE: Unable to fully assess due to pain   Balance    End of Session PT - End of Session Equipment Utilized During Treatment: Gait belt Activity Tolerance: Patient tolerated treatment well Patient left: in chair;with family/visitor present Nurse Communication: Mobility status CPM Right Knee CPM Right Knee: Off  GP     Marella Bile 11/08/2012, 9:42 PM Marella Bile, PT Pager: [phone] 11/08/2012

## 2012-11-08 NOTE — Anesthesia Preprocedure Evaluation (Addendum)
Anesthesia Evaluation  Patient identified by MRN, date of birth, ID band Patient awake    Reviewed: Allergy & Precautions, H&P , NPO status , Patient's Chart, lab work & pertinent test results  Airway Mallampati: II TM Distance: >3 FB Neck ROM: Full    Dental no notable dental hx.    Pulmonary sleep apnea and Continuous Positive Airway Pressure Ventilation ,  breath sounds clear to auscultation  Pulmonary exam normal       Cardiovascular Exercise Tolerance: Good negative cardio ROS  Rhythm:Regular Rate:Normal     Neuro/Psych  Headaches,  Neuromuscular disease negative psych ROS   GI/Hepatic negative GI ROS, Neg liver ROS,   Endo/Other  Hypothyroidism   Renal/GU negative Renal ROS  negative genitourinary   Musculoskeletal  (+) Fibromyalgia -  Abdominal (+) + obese,   Peds negative pediatric ROS (+)  Hematology negative hematology ROS (+)   Anesthesia Other Findings   Reproductive/Obstetrics negative OB ROS                          Anesthesia Physical Anesthesia Plan  ASA: II  Anesthesia Plan: General   Post-op Pain Management:    Induction: Intravenous  Airway Management Planned: Double Lumen EBT  Additional Equipment:   Intra-op Plan:   Post-operative Plan: Extubation in OR  Informed Consent: I have reviewed the patients History and Physical, chart, labs and discussed the procedure including the risks, benefits and alternatives for the proposed anesthesia with the patient or authorized representative who has indicated his/her understanding and acceptance.   Dental advisory given  Plan Discussed with: CRNA  Anesthesia Plan Comments: (Discussed risks/benefits of general versus spinal. Patient strongly prefers general.)       Anesthesia Quick Evaluation

## 2012-11-08 NOTE — Progress Notes (Signed)
Per dr. Council Mechanic, ok to transfer patient to floor

## 2012-11-08 NOTE — H&P (View-Only) (Signed)
[name]  DOB: [birth year] Married / Language: English / Race: White Female  Date of Admission:  11/08/2012  Chief Complaint:  Right knee pain  History of Present Illness The patient is a 72 year old female who comes in for a preoperative History and Physical. The patient is scheduled for a right total knee arthroplasty to be performed by Dr. Gus Rankin. Aluisio, MD at [facility] on 11/08/2012. The patient is a 72 year old female who presents for follow up of their knee. The patient is being followed for their right knee pain and osteoarthritis. They are now several months out from Synvisc series (that did provide relief, but is starting to wear off). Symptoms reported today include: pain (that increases with weightbearing), pain at night (for the past 2 months), swelling and stiffness (and warmth). The following medication has been used for pain control: antiinflammatory medication (Celebrex). [name] comes back in several months from her most recent Synvisc series. She said the injections have helped. She has had some increasing pain with her knee though. She knows she's going to have to have a knee replacement at some point. She's been trying to do water aerobics, but recently she's been out of those exercises for some other issues. She has noticed some swelling. When it's more swollen, it feels warmer to her. She's been having some popping and it feels like it is "out of alignment". There's been some buckling but no catching with it. She's been on Celebrex for many years and takes 200 mg. daily per Dr. Corliss Skains. She is now at a point where she is ready to get the knee replaced. They have been treated conservatively in the past for the above stated problem and despite conservative measures, they continue to have progressive pain and severe functional limitations and dysfunction. They have failed non-operative management including home exercise, medications, and  injections. It is felt that they would benefit from undergoing total joint replacement. Risks and benefits of the procedure have been discussed with the patient and they elect to proceed with surgery. There are no active contraindications to surgery such as ongoing infection or rapidly progressive neurological disease.  Problem List S/P total knee arthroplasty Osteoarthritis of right knee (715.96)   Allergies Penicillin VK *PENICILLINS*. Childhood - whelps with her mouth Codeine Phosphate *ANALGESICS - OPIOID*. Nausea.   Family History Osteoarthritis. mother Hypertension. mother and brother Cancer. mother and brother   Social History Alcohol use. current drinker; only occasionally per week Children. 0 Current work status. retired Financial planner (Currently). no Drug/Alcohol Rehab (Previously). no Exercise. Exercises weekly Illicit drug use. no Living situation. live with spouse Marital status. married Number of flights of stairs before winded. 2-3 Tobacco / smoke exposure. yes Tobacco use. never smoker Merchant [employer] officer. Living Will   Medication History Lexapro (20MG  Tablet, Oral) Active. CeleBREX (200MG  Capsule, Oral) Active. Melatonin ( Oral) Specific dose unknown - Active. Aspirin EC (81MG  Tablet DR, Oral) Active. Lunesta (3MG  Tablet, Oral) Active. Cyclobenzaprine HCl (10MG  Tablet, Oral) Active. Unisom PM Pain (50-325MG  Tablet, Oral) Active. Armour Thyroid (60MG  Tablet, Oral) Active.   Past Surgical History Tonsillectomy Total Knee Replacement. Date: 08/2005. left Tubal Ligation Breast Biopsy. left   Medical History Sleep Apnea. uses CPAP Fibromyalgia Hypothyroidism Migraine Headache Osteoarthritis Impaired Hearing Scarlet Fever. Past History   Review of Systems General:Present- Night Sweats and Fatigue. Not Present- Chills, Fever, Weight Gain, Weight Loss and Memory Loss. Skin:Not Present- Hives, Itching, Rash,  Eczema and Lesions. HEENT:Not Present- Tinnitus,  Headache, Double Vision, Visual Loss, Hearing Loss and Dentures. Respiratory:Not Present- Shortness of breath with exertion, Shortness of breath at rest, Allergies, Coughing up blood and Chronic Cough. Cardiovascular:Not Present- Chest Pain, Racing/skipping heartbeats, Difficulty Breathing Lying Down, Murmur, Swelling and Palpitations. Gastrointestinal:Present- Constipation. Not Present- Bloody Stool, Heartburn, Abdominal Pain, Vomiting, Nausea, Diarrhea, Difficulty Swallowing, Jaundice and Loss of appetitie. Female Genitourinary:Not Present- Blood in Urine, Urinary frequency, Weak urinary stream, Discharge, Flank Pain, Incontinence, Painful Urination, Urgency, Urinary Retention and Urinating at Night. Musculoskeletal:Present- Joint Swelling, Joint Pain, Back Pain and Morning Stiffness. Not Present- Muscle Weakness, Muscle Pain and Spasms. Neurological:Not Present- Tremor, Dizziness, Blackout spells, Paralysis, Difficulty with balance and Weakness. Psychiatric:Not Present- Insomnia.   Vitals Weight: 184 lb Height: 66.5 in Weight was reported by patient. Height was reported by patient. Body Surface Area: 1.98 m Body Mass Index: 29.25 kg/m Pulse: 84 (Regular) Resp.: 147 (Unlabored) BP: 124/74 (Sitting, Right Arm, Standard)    Physical Exam The physical exam findings are as follows:  Note: Patient is a 72 year old female with continued knee pain.   General Mental Status - Alert, cooperative and good historian. General Appearance- pleasant. Not in acute distress. Orientation- Oriented X3. Build & Nutrition- Well nourished and Well developed.   Head and Neck Head- normocephalic, atraumatic . Neck Global Assessment- supple. no bruit auscultated on the right and no bruit auscultated on the left.   Eye Vision- Wears contact lenses (Right eye contact). Pupil- Bilateral- Regular and Round. Motion-  Bilateral- EOMI.   Chest and Lung Exam Auscultation: Breath sounds:- clear at anterior chest wall and - clear at posterior chest wall. Adventitious sounds:- No Adventitious sounds.   Cardiovascular Auscultation:Rhythm- Regular rate and rhythm. Heart Sounds- S1 WNL and S2 WNL. Murmurs & Other Heart Sounds:Auscultation of the heart reveals - No Murmurs.   Abdomen Palpation/Percussion:Tenderness- Abdomen is non-tender to palpation. Rigidity (guarding)- Abdomen is soft. Auscultation:Auscultation of the abdomen reveals - Bowel sounds normal.   Female Genitourinary  Not done, not pertinent to present illness  Musculoskeletal On exam, 72 YO female, well nourished, well developed, in no acute distress. Right knee is examined. She has full extension with flexion back to 120-125 degrees active and passive. She has mild to moderate crepitus, patellofemoral. I do not appreciate any intra-articular effusions. She's nontender over her quad tendon and patellar ligament. Extensor mechanism is intact. Her medial and collateral ligaments are stable with varus/valgus stressing. She is tender to palpation over the medial joint line.  RADIOGRAPHS: AP view, standing of both knees and lateral of the right knee are taken. One single view of the left knee shows the total knee prosthesis in good alignment. The right knee on AP view and lateral view confirms bone on bone in the medial compartment with also some patellofemoral spurring. These were reviewed and compared back to films back in 2013, when she was near bone on bone showing that she has had some progression of her arthritis in the medial compartment.  Assessment & Plan Osteoarthritis of right knee (715.96) Current Plans l Pt Education - How to access health information online: discussed with patient and provided information.  Note: Plan is for a Right Total Knee Replacement by Dr. Lequita Halt.  Plan is to go  home.  PCP - Dr. Vianne Bulls  The patient does not have any contraindications and will recieve TXA (tranexamic acid) prior to surgery.  Time spent ~ 30 minutes  Signed electronically by Lauraine Rinne, III PA-C

## 2012-11-08 NOTE — Progress Notes (Signed)
Utilization review completed.  

## 2012-11-08 NOTE — Transfer of Care (Signed)
Immediate Anesthesia Transfer of Care Note  Patient: [name]  Procedure(s) Performed: Procedure(s): RIGHT TOTAL KNEE ARTHROPLASTY (Right)  Patient Location: PACU  Anesthesia Type:General  Level of Consciousness: awake and alert   Airway & Oxygen Therapy: Patient Spontanous Breathing and Patient connected to face mask oxygen  Post-op Assessment: Report given to PACU RN and Post -op Vital signs reviewed and stable  Post vital signs: Reviewed and stable  Complications: No apparent anesthesia complications

## 2012-11-08 NOTE — Anesthesia Postprocedure Evaluation (Signed)
  Anesthesia Post-op Note  Patient: [name]  Procedure(s) Performed: Procedure(s) (LRB): RIGHT TOTAL KNEE ARTHROPLASTY (Right)  Patient Location: PACU  Anesthesia Type: General  Level of Consciousness: awake and alert   Airway and Oxygen Therapy: Patient Spontanous Breathing  Post-op Pain: mild  Post-op Assessment: Post-op Vital signs reviewed, Patient's Cardiovascular Status Stable, Respiratory Function Stable, Patent Airway and No signs of Nausea or vomiting  Last Vitals:  Filed Vitals:   11/08/12 0935  BP:   Pulse: 98  Temp:   Resp: 18    Post-op Vital Signs: stable   Complications: No apparent anesthesia complications. OSA precautions on floor.

## 2012-11-09 DIAGNOSIS — D62 Acute posthemorrhagic anemia: Secondary | ICD-10-CM | POA: Diagnosis not present

## 2012-11-09 LAB — BASIC METABOLIC PANEL
BUN: 12 mg/dL (ref 6–23)
CO2: 28 mEq/L (ref 19–32)
Calcium: 8.4 mg/dL (ref 8.4–10.5)
Chloride: 104 mEq/L (ref 96–112)
Creatinine, Ser: 0.53 mg/dL (ref 0.50–1.10)
GFR calc Af Amer: >90 mL/min (ref >90)
GFR calc non Af Amer: >90 mL/min (ref >90)
Glucose, Bld: 120 mg/dL — ABNORMAL HIGH (ref 70–99)
Potassium: 3.7 mEq/L (ref 3.5–5.1)
Sodium: 139 mEq/L (ref 135–145)

## 2012-11-09 LAB — CBC
HCT: 35.2 % — ABNORMAL LOW (ref 36.0–46.0)
Hemoglobin: 11.6 g/dL — ABNORMAL LOW (ref 12.0–15.0)
MCH: 29.4 pg (ref 26.0–34.0)
MCHC: 33 g/dL (ref 30.0–36.0)
MCV: 89.3 fL (ref 78.0–100.0)
Platelets: 197 10*3/uL (ref 150–400)
RBC: 3.94 MIL/uL (ref 3.87–5.11)
RDW: 13.1 % (ref 11.5–15.5)
WBC: 10 10*3/uL (ref 4.0–10.5)

## 2012-11-09 MED ORDER — FLUCONAZOLE 150 MG PO TABS
150.0000 mg | ORAL_TABLET | Freq: Every day | ORAL | Status: DC
  Administered 2012-11-10: 150 mg via ORAL
  Filled 2012-11-09 (×2): qty 1

## 2012-11-09 MED ORDER — ALPRAZOLAM 0.5 MG PO TABS: 0.5000 mg | ORAL_TABLET | Freq: Four times a day (QID) | ORAL | Status: DC | PRN

## 2012-11-09 NOTE — Progress Notes (Signed)
PT HAS SMALL BLISTER/BUMP ON THE TIP OF HER TONGUE.

## 2012-11-09 NOTE — Progress Notes (Signed)
PT Cancellation Note  ___Treatment cancelled today due to medical issues with patient which prohibited therapy  ___ Treatment cancelled today due to patient receiving procedure or test   ___ Treatment cancelled today due to patient's refusal to participate   _X_ Afternoon Treatment cancelled today due to increased c/o pain.  Just amb to and from BR with RN and just got back to bed  Requesting more pain meds.  Felecia Shelling  PTA [facility]  Acute  Rehab Pager      [phone]

## 2012-11-09 NOTE — Progress Notes (Signed)
Physical Therapy Treatment Patient Details Name: EMMAJO BENNETTE MRN: [id number] DOB: [birth year] Today's Date: 11/09/2012 Time: 5284-1324 PT Time Calculation (min): 24 min  PT Assessment / Plan / Recommendation  History of Present Illness The patient is a 72 year old female who comes in for a preoperative History and Physical. The patient is scheduled for a right total knee arthroplasty to be performed by Dr. Gus Rankin. Aluisio, MD at [facility] on 11/08/2012.   PT Comments   POD # 1 R TKR am session.  Assisted pt OOB to amb in hallway.  Instructed on KI use for amb and proper application.  Assisted to BR to void with VC's on safety with turns.  Positioned in recliner with ICE to R knee.   Follow Up Recommendations  Home health PT     Does the patient have the potential to tolerate intense rehabilitation     Barriers to Discharge        Equipment Recommendations  None recommended by PT    Recommendations for Other Services    Frequency 7X/week   Progress towards PT Goals Progress towards PT goals: Progressing toward goals  Plan      Precautions / Restrictions Precautions Precautions: Knee Precaution Comments: Instructed pt on KI use for amb Required Braces or Orthoses: Knee Immobilizer - Right Knee Immobilizer - Right: Discontinue once straight leg raise with < 10 degree lag Restrictions Weight Bearing Restrictions: No RLE Weight Bearing: Weight bearing as tolerated    Pertinent Vitals/Pain C/o 5/10 pain ICE applied    Mobility  Bed Mobility Bed Mobility: Supine to Sit;Sitting - Scoot to Edge of Bed Supine to Sit: 4: Min assist Sitting - Scoot to Edge of Bed: 5: Supervision Sit to Supine: Not Tested (comment) Details for Bed Mobility Assistance: cues and assist for sequencing anfd for LE and scooting Transfers Transfers: Sit to Stand;Stand to Sit Sit to Stand: 4: Min assist;With upper extremity assist;4: Min guard;From bed;From toilet Stand to Sit: 4: Min  assist;With upper extremity assist;4: Min guard;To chair/3-in-1;To toilet Details for Transfer Assistance: cues for sequencing with UEs and LEs and use of RW Ambulation/Gait Ambulation/Gait Assistance: 4: Min guard Ambulation Distance (Feet): 85 Feet Assistive device: Rolling walker Ambulation/Gait Assistance Details: <25% VC's on safety with turns and proper walker to self distance Gait Pattern: Step-to pattern Gait velocity: decreased    PT Goals (current goals can now be found in the care plan section) Acute Rehab PT Goals Patient Stated Goal: To get better and go home when able  Visit Information  Last PT Received On: 11/09/12 Assistance Needed: +1 History of Present Illness: The patient is a 72 year old female who comes in for a preoperative History and Physical. The patient is scheduled for a right total knee arthroplasty to be performed by Dr. Gus Rankin. Aluisio, MD at [facility] on 11/08/2012.    Subjective Data  Patient Stated Goal: To get better and go home when able   Cognition  Cognition Arousal/Alertness: Awake/alert Behavior During Therapy: WFL for tasks assessed/performed Overall Cognitive Status: Within Functional Limits for tasks assessed    Balance  Balance Balance Assessed: No  End of Session PT - End of Session Equipment Utilized During Treatment: Gait belt;Right knee immobilizer Activity Tolerance: Patient tolerated treatment well Patient left: in chair;with family/visitor present CPM Right Knee CPM Right Knee: Off   Felecia Shelling  PTA [facility]  Acute  Rehab Pager      [phone]

## 2012-11-09 NOTE — Progress Notes (Signed)
Pt had stated she was unable to take any sleeping med except for Lunesta from home which she took. MD was overheard by previous RN that she could do just that. Diflucan from home also. Held all pm medications as pt had taken Lunesta and placed CPAP on. She had asked to not be awakened when asleep. Will abide by her wishes.

## 2012-11-09 NOTE — Progress Notes (Signed)
   Subjective: 1 Day Post-Op Procedure(s) (LRB): RIGHT TOTAL KNEE ARTHROPLASTY (Right) Patient reports pain as moderate and severe.   Patient seen in rounds with Dr. Lequita Halt. Had a rough night.  No sleep.  Will allow her to take her own Lunesta from home.  Family in room at bedside. Complaints of itching scalp.  Using Benadryl. Patient is having problems with pain in the knee, requiring pain medications We will start therapy today.  Plan is to go Home after hospital stay.  Objective: Vital signs in last 24 hours: Temp:  [97.6 F (36.4 C)-98.9 F (37.2 C)] 97.6 F (36.4 C) (09/16 0541) Pulse Rate:  [67-103] 79 (09/16 0541) Resp:  [10-18] 18 (09/16 0541) BP: (105-150)/(66-100) 126/75 mmHg (09/16 0541) SpO2:  [91 %-100 %] 96 % (09/16 0541) Weight:  [85.73 kg (189 lb)] 85.73 kg (189 lb) (09/15 1010)  Intake/Output from previous day:  Intake/Output Summary (Last 24 hours) at 11/09/12 0842 Last data filed at 11/09/12 0545  Gross per 24 hour  Intake   3070 ml  Output   4915 ml  Net  -1845 ml    Intake/Output this shift: UOP 1250 since MN  Labs:  Recent Labs  11/09/12 0600  HGB 11.6*    Recent Labs  11/09/12 0600  WBC 10.0  RBC 3.94  HCT 35.2*  PLT 197    Recent Labs  11/09/12 0600  NA 139  K 3.7  CL 104  CO2 28  BUN 12  CREATININE 0.53  GLUCOSE 120*  CALCIUM 8.4   No results found for this basename: LABPT, INR,  in the last 72 hours  EXAM General - Patient is Alert, Appropriate and Oriented Extremity - Neurovascular intact Sensation intact distally Dressing - dressing C/D/I Motor Function - intact, moving foot and toes well on exam.  Hemovac pulled without difficulty.  Past Medical History  Diagnosis Date  . Migraine 10/88    Spillman  . 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 Fibromyalgia 8/98    [place]  . 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 Plantar fasciitis   . Osteoarthritis 2007    Deveschwar  . Vitreous degeneration of  right eye   . Nuclear sclerosis   . Impaired hearing     left ear  . Scarlet fever as child  . Constipation   . OSA on CPAP 2/07    uses cpap setting of 10  . Pain last week    left under breast pain     Assessment/Plan: 1 Day Post-Op Procedure(s) (LRB): RIGHT TOTAL KNEE ARTHROPLASTY (Right) Principal Problem:   OA (osteoarthritis) of knee Active Problems:   Postoperative anemia due to acute blood loss  Estimated body mass index is 30.52 kg/(m^2) as calculated from the following:   Height as of this encounter: 5\' 6"  (1.676 m).   Weight as of this encounter: 85.73 kg (189 lb). Advance diet Up with therapy Plan for discharge tomorrow Discharge home with home health  DVT Prophylaxis - Xarelto Weight-Bearing as tolerated to right leg No vaccines. D/C O2 and Pulse OX and try on Room Air Allow home med - [facility] 11/09/2012, 8:42 AM

## 2012-11-09 NOTE — Progress Notes (Signed)
Patient c/o restlessness and request Xanax for rest. Has been refusing offer of Ambien for sleep. Patient relay she takes unisom & melatonin for sleep at home. Spoke with A. Constable PA per telephone. She prefers patient not take these home meds re: use of blood thinner. T.O.V. for Xanax every 6 hours prn for anxiety.

## 2012-11-09 NOTE — Evaluation (Signed)
Occupational Therapy Evaluation Patient Details Name: [name] MRN: [id number] DOB: October 07, 1940 Today's Date: 11/09/2012 Time: 1478-2956 OT Time Calculation (min): 44 min  OT Assessment / Plan / Recommendation History of present illness The patient is a 72 year old female who comes in for a preoperative History and Physical. The patient is scheduled for a right total knee arthroplasty to be performed by Dr. Gus Rankin. Aluisio, MD at [facility] on 11/08/2012.   Clinical Impression   Pt admitted for R TKR and has the deficits listed below. Pt would benefit from cont OT for 1-2 sessions to achieve S with adls and adls transfers so she can d/c home safely with her husband.    OT Assessment       Follow Up Recommendations  Supervision/Assistance - 24 hour;Other (comment) (most likely will not need HHOT)    Barriers to Discharge      Equipment Recommendations  None recommended by OT    Recommendations for Other Services    Frequency       Precautions / Restrictions Precautions Precautions: Knee Required Braces or Orthoses: Knee Immobilizer - Right Knee Immobilizer - Right: Discontinue once straight leg raise with < 10 degree lag Restrictions Weight Bearing Restrictions: No RLE Weight Bearing: Weight bearing as tolerated   Pertinent Vitals/Pain Pt with 6/10 pain.  Meds given during OT session.  O2 sats 95% on RA.    ADL  Eating/Feeding: Performed;Independent Where Assessed - Eating/Feeding: Chair Grooming: Performed;Set up Where Assessed - Grooming: Supported sitting Upper Body Bathing: Simulated;Set up Where Assessed - Upper Body Bathing: Supported sitting Lower Body Bathing: Simulated;Moderate assistance Where Assessed - Lower Body Bathing: Supported sit to stand Upper Body Dressing: Simulated;Set up Where Assessed - Upper Body Dressing: Unsupported sitting Lower Body Dressing: Performed;Moderate assistance Where Assessed - Lower Body Dressing: Supported sit to  Pharmacist, hospital: Performed;Minimal Dentist Method: Surveyor, minerals: Materials engineer and Hygiene: Performed;Set up Where Assessed - Toileting Clothing Manipulation and Hygiene: Sit to stand from 3-in-1 or toilet Equipment Used: Rolling walker Transfers/Ambulation Related to ADLs: Pt transfered to chair with min assist. Pt iwth some anxiety on her feet.  Discussed breathing when up on feet and taking her time to relieve anxiety. ADL Comments: Pt requires some assist with LE adls but does understand all techniques.  Husband will assist at home as needed.  Pt has had other knee done and therefore understands all techniques.    OT Diagnosis:    OT Problem List:   OT Treatment Interventions:     OT Goals(Current goals can be found in the care plan section) Acute Rehab OT Goals Patient Stated Goal: To get better and go home when able OT Goal Formulation: With patient Time For Goal Achievement: 11/16/12 Potential to Achieve Goals: Good ADL Goals Pt Will Perform Grooming: with supervision;standing Pt Will Perform Tub/Shower Transfer: Shower transfer;ambulating;with supervision Additional ADL Goal #1: Pt wil complete all tolieting with 3:1 over commode with S.  Visit Information  Last OT Received On: 11/09/12 Assistance Needed: +1 History of Present Illness: The patient is a 72 year old female who comes in for a preoperative History and Physical. The patient is scheduled for a right total knee arthroplasty to be performed by Dr. Gus Rankin. Aluisio, MD at [facility] on 11/08/2012.       Prior Functioning     Home Living Family/patient expects to be discharged to:: Private residence Living Arrangements:  Spouse/significant other Available Help at Discharge: Family Type of Home: House Home Access: Stairs to enter [employer] of Steps: 2 Home Layout: One level Home Equipment: Walker - 2  wheels;Bedside commode Prior Function Level of Independence: Independent Comments: drives, indepedent, enjoys water aerobics for exercise Communication Communication: No difficulties Dominant Hand: Right         Vision/Perception     Cognition  Cognition Arousal/Alertness: Awake/alert Behavior During Therapy: WFL for tasks assessed/performed Overall Cognitive Status: Within Functional Limits for tasks assessed    Extremity/Trunk Assessment Upper Extremity Assessment Upper Extremity Assessment: Overall WFL for tasks assessed Lower Extremity Assessment Lower Extremity Assessment: Defer to PT evaluation RLE Deficits / Details: limited by pain and banadage from p/o  Cervical / Trunk Assessment Cervical / Trunk Assessment: Normal     Mobility Bed Mobility Bed Mobility: Supine to Sit;Sitting - Scoot to Edge of Bed Supine to Sit: 4: Min assist Sitting - Scoot to [employer] of Bed: 5: Supervision Sit to Supine: Not Tested (comment) Details for Bed Mobility Assistance: cues and assist for sequencing anfd for LE and scooting Transfers Transfers: Sit to Stand;Stand to Sit Sit to Stand: 4: Min assist;With upper extremity assist Stand to Sit: 4: Min assist;With upper extremity assist Details for Transfer Assistance: cues for sequencing with UEs and LEs and use of rW     Exercise     Balance Balance Balance Assessed: No   End of Session OT - End of Session Equipment Utilized During Treatment: Rolling walker;Right knee immobilizer Activity Tolerance: Patient tolerated treatment well Patient left: in chair;with call bell/phone within reach;with family/visitor present Nurse Communication: Mobility status;Other (comment);Patient requests pain meds (pt wants catheter out.) CPM Right Knee CPM Right Knee: Off  GO     Hope Budds 11/09/2012, 9:26 AM [phone]

## 2012-11-10 LAB — CBC
HCT: 34.2 % — ABNORMAL LOW (ref 36.0–46.0)
Hemoglobin: 11.5 g/dL — ABNORMAL LOW (ref 12.0–15.0)
MCH: 29.7 pg (ref 26.0–34.0)
MCHC: 33.6 g/dL (ref 30.0–36.0)
MCV: 88.4 fL (ref 78.0–100.0)
Platelets: 198 10*3/uL (ref 150–400)
RBC: 3.87 MIL/uL (ref 3.87–5.11)
RDW: 13.1 % (ref 11.5–15.5)
WBC: 11.5 10*3/uL — ABNORMAL HIGH (ref 4.0–10.5)

## 2012-11-10 LAB — BASIC METABOLIC PANEL
BUN: 9 mg/dL (ref 6–23)
CO2: 29 mEq/L (ref 19–32)
Calcium: 8.8 mg/dL (ref 8.4–10.5)
Chloride: 99 mEq/L (ref 96–112)
Creatinine, Ser: 0.47 mg/dL — ABNORMAL LOW (ref 0.50–1.10)
GFR calc Af Amer: >90 mL/min (ref >90)
GFR calc non Af Amer: >90 mL/min (ref >90)
Glucose, Bld: 112 mg/dL — ABNORMAL HIGH (ref 70–99)
Potassium: 3.8 mEq/L (ref 3.5–5.1)
Sodium: 137 mEq/L (ref 135–145)

## 2012-11-10 MED ORDER — METHOCARBAMOL 500 MG PO TABS: 500.0000 mg | ORAL_TABLET | Freq: Four times a day (QID) | ORAL | Status: DC | PRN

## 2012-11-10 MED ORDER — TRAMADOL HCL 50 MG PO TABS: 50.0000 mg | ORAL_TABLET | Freq: Four times a day (QID) | ORAL | Status: DC | PRN

## 2012-11-10 MED ORDER — OXYCODONE HCL 5 MG PO TABS: 5.0000 mg | ORAL_TABLET | ORAL | Status: DC | PRN

## 2012-11-10 MED ORDER — RIVAROXABAN 10 MG PO TABS: 10.0000 mg | ORAL_TABLET | Freq: Every day | ORAL | Status: DC

## 2012-11-10 NOTE — Discharge Summary (Signed)
Physician Discharge Summary   Patient ID: [name] MRN: [id number] DOB/AGE: 08/07/1940 72 y.o.  Admit date: 11/08/2012 Discharge date: 11/10/2012  Primary Diagnosis:  Osteoarthritis Right knee(s)  Admission Diagnoses:  Past Medical History  Diagnosis Date  . Migraine 10/88    Spillman  . 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 Fibromyalgia 8/98    Truslow  . 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 Plantar fasciitis   . Osteoarthritis 2007    Deveschwar  . Vitreous degeneration of right eye   . Nuclear sclerosis   . Impaired hearing     left ear  . Scarlet fever as child  . Constipation   . OSA on CPAP 2/07    uses cpap setting of 10  . Pain last week    left under breast pain    Discharge Diagnoses:   Principal Problem:   OA (osteoarthritis) of knee Active Problems:   Postoperative anemia due to acute blood loss  Estimated body mass index is 30.52 kg/(m^2) as calculated from the following:   Height as of this encounter: 5\' 6"  (1.676 m).   Weight as of this encounter: 85.73 kg (189 lb).  Procedure:  Procedure(s) (LRB): RIGHT TOTAL KNEE ARTHROPLASTY (Right)   Consults: None  HPI: [name] is a 72 y.o. year old female with end stage OA of her right knee with progressively worsening pain and dysfunction. She has constant pain, with activity and at rest and significant functional deficits with difficulties even with ADLs. She has had extensive non-op management including analgesics, injections of cortisone and viscosupplements, and home exercise program, but remains in significant pain with significant dysfunction.Radiographs show bone on bone arthritis medial and patellofemoral. She presents now for right Total Knee Arthroplasty.   Laboratory Data: Admission on 11/08/2012, Discharged on 11/10/2012  Component Date Value Range Status  . ABO/RH(D) 11/08/2012 O POS   Final  . Antibody Screen 11/08/2012 NEG   Final  . Sample Expiration 11/08/2012 11/11/2012    Final  . WBC 11/09/2012 10.0  4.0 - 10.5 K/uL Final  . RBC 11/09/2012 3.94  3.87 - 5.11 MIL/uL Final  . Hemoglobin 11/09/2012 11.6* 12.0 - 15.0 g/dL Final  . HCT 40/98/1191 35.2* 36.0 - 46.0 % Final  . MCV 11/09/2012 89.3  78.0 - 100.0 fL Final  . MCH 11/09/2012 29.4  26.0 - 34.0 pg Final  . MCHC 11/09/2012 33.0  30.0 - 36.0 g/dL Final  . RDW 47/82/9562 13.1  11.5 - 15.5 % Final  . Platelets 11/09/2012 197  150 - 400 K/uL Final  . Sodium 11/09/2012 139  135 - 145 mEq/L Final  . Potassium 11/09/2012 3.7  3.5 - 5.1 mEq/L Final  . Chloride 11/09/2012 104  96 - 112 mEq/L Final  . CO2 11/09/2012 28  19 - 32 mEq/L Final  . Glucose, Bld 11/09/2012 120* 70 - 99 mg/dL Final  . BUN 13/09/6576 12  6 - 23 mg/dL Final  . Creatinine, Ser 11/09/2012 0.53  0.50 - 1.10 mg/dL Final  . Calcium 46/96/2952 8.4  8.4 - 10.5 mg/dL Final  . GFR calc non Af Amer 11/09/2012 >90  >90 mL/min Final  . GFR calc Af Amer 11/09/2012 >90  >90 mL/min Final   Comment: (NOTE)                          The eGFR has been calculated using the CKD EPI equation.  This calculation has not been validated in all clinical situations.                          eGFR's persistently <90 mL/min signify possible Chronic Kidney                          Disease.  . WBC 11/10/2012 11.5* 4.0 - 10.5 K/uL Final  . RBC 11/10/2012 3.87  3.87 - 5.11 MIL/uL Final  . Hemoglobin 11/10/2012 11.5* 12.0 - 15.0 g/dL Final  . HCT 16/11/9602 34.2* 36.0 - 46.0 % Final  . MCV 11/10/2012 88.4  78.0 - 100.0 fL Final  . MCH 11/10/2012 29.7  26.0 - 34.0 pg Final  . MCHC 11/10/2012 33.6  30.0 - 36.0 g/dL Final  . RDW 54/10/8117 13.1  11.5 - 15.5 % Final  . Platelets 11/10/2012 198  150 - 400 K/uL Final  . Sodium 11/10/2012 137  135 - 145 mEq/L Final  . Potassium 11/10/2012 3.8  3.5 - 5.1 mEq/L Final  . Chloride 11/10/2012 99  96 - 112 mEq/L Final  . CO2 11/10/2012 29  19 - 32 mEq/L Final  . Glucose, Bld 11/10/2012 112* 70 - 99  mg/dL Final  . BUN 14/78/2956 9  6 - 23 mg/dL Final  . Creatinine, Ser 11/10/2012 0.47* 0.50 - 1.10 mg/dL Final  . Calcium 21/30/8657 8.8  8.4 - 10.5 mg/dL Final  . GFR calc non Af Amer 11/10/2012 >90  >90 mL/min Final  . GFR calc Af Amer 11/10/2012 >90  >90 mL/min Final   Comment: (NOTE)                          The eGFR has been calculated using the CKD EPI equation.                          This calculation has not been validated in all clinical situations.                          eGFR's persistently <90 mL/min signify possible Chronic Kidney                          Disease.  Hospital Outpatient Visit on 11/02/2012  Component Date Value Range Status  . aPTT 11/02/2012 26  24 - 37 seconds Final  . WBC 11/02/2012 6.6  4.0 - 10.5 K/uL Final  . RBC 11/02/2012 4.76  3.87 - 5.11 MIL/uL Final  . Hemoglobin 11/02/2012 14.0  12.0 - 15.0 g/dL Final  . HCT 84/69/6295 41.6  36.0 - 46.0 % Final  . MCV 11/02/2012 87.4  78.0 - 100.0 fL Final  . MCH 11/02/2012 29.4  26.0 - 34.0 pg Final  . MCHC 11/02/2012 33.7  30.0 - 36.0 g/dL Final  . RDW 28/41/3244 12.9  11.5 - 15.5 % Final  . Platelets 11/02/2012 242  150 - 400 K/uL Final  . Sodium 11/02/2012 138  135 - 145 mEq/L Final  . Potassium 11/02/2012 4.2  3.5 - 5.1 mEq/L Final  . Chloride 11/02/2012 103  96 - 112 mEq/L Final  . CO2 11/02/2012 28  19 - 32 mEq/L Final  . Glucose, Bld 11/02/2012 111* 70 - 99 mg/dL Final  . BUN 02/26/7251 13  6 - 23 mg/dL Final  .  Creatinine, Ser 11/02/2012 0.50  0.50 - 1.10 mg/dL Final  . Calcium 16/11/9602 9.5  8.4 - 10.5 mg/dL Final  . Total Protein 11/02/2012 6.9  6.0 - 8.3 g/dL Final  . Albumin 54/10/8117 3.7  3.5 - 5.2 g/dL Final  . AST 14/78/2956 18  0 - 37 U/L Final  . ALT 11/02/2012 17  0 - 35 U/L Final  . Alkaline Phosphatase 11/02/2012 78  39 - 117 U/L Final  . Total Bilirubin 11/02/2012 0.2* 0.3 - 1.2 mg/dL Final  . GFR calc non Af Amer 11/02/2012 >90  >90 mL/min Final  . GFR calc Af Amer 11/02/2012  >90  >90 mL/min Final   Comment: (NOTE)                          The eGFR has been calculated using the CKD EPI equation.                          This calculation has not been validated in all clinical situations.                          eGFR's persistently <90 mL/min signify possible Chronic Kidney                          Disease.  Marland Kitchen Prothrombin Time 11/02/2012 12.5  11.6 - 15.2 seconds Final  . INR 11/02/2012 0.95  0.00 - 1.49 Final  . Color, Urine 11/02/2012 YELLOW  YELLOW Final  . APPearance 11/02/2012 CLEAR  CLEAR Final  . Specific Gravity, Urine 11/02/2012 1.011  1.005 - 1.030 Final  . pH 11/02/2012 7.5  5.0 - 8.0 Final  . Glucose, UA 11/02/2012 NEGATIVE  NEGATIVE mg/dL Final  . Hgb urine dipstick 11/02/2012 NEGATIVE  NEGATIVE Final  . Bilirubin Urine 11/02/2012 NEGATIVE  NEGATIVE Final  . Ketones, ur 11/02/2012 NEGATIVE  NEGATIVE mg/dL Final  . Protein, ur 21/30/8657 NEGATIVE  NEGATIVE mg/dL Final  . Urobilinogen, UA 11/02/2012 0.2  0.0 - 1.0 mg/dL Final  . Nitrite 84/69/6295 NEGATIVE  NEGATIVE Final  . Leukocytes, UA 11/02/2012 SMALL* NEGATIVE Final  . MRSA, PCR 11/02/2012 NEGATIVE  NEGATIVE Final  . Staphylococcus aureus 11/02/2012 NEGATIVE  NEGATIVE Final   Comment:                                 The Xpert SA Assay (FDA                          approved for NASAL specimens                          in patients over 35 years of age),                          is one component of                          a comprehensive surveillance                          program.  Test performance has  been validated by [facility] for patients greater                          than or equal to 16 year old.                          It is not intended                          to diagnose infection nor to                          guide or monitor treatment.  . Squamous Epithelial / LPF 11/02/2012 RARE  RARE Final  . WBC, UA 11/02/2012 0-2   <3 WBC/hpf Final     X-Rays:No results found.  EKG: Orders placed during the hospital encounter of 11/08/12  . EKG     Hospital Course: [name] is a 72 y.o. who was admitted to [facility]. They were brought to the operating room on 11/08/2012 and underwent Procedure(s): RIGHT TOTAL KNEE ARTHROPLASTY.  Patient tolerated the procedure well and was later transferred to the recovery room and then to the orthopaedic floor for postoperative care.  They were given PO and IV analgesics for pain control following their surgery.  They were given 24 hours of postoperative antibiotics of  Anti-infectives   Start     Dose/Rate Route Frequency Ordered Stop   11/09/12 2100  fluconazole (DIFLUCAN) tablet 150 mg  Status:  Discontinued     150 mg Oral Daily 11/09/12 1653 11/10/12 1556   11/08/12 2000  vancomycin (VANCOCIN) IVPB 1000 mg/200 mL premix     1,000 mg 200 mL/hr over 60 Minutes Intravenous Every 12 hours 11/08/12 1024 11/08/12 2117   11/08/12 1200  valACYclovir (VALTREX) tablet 500 mg  Status:  Discontinued     500 mg Oral Daily 11/08/12 1024 11/10/12 1556   11/08/12 0604  vancomycin (VANCOCIN) IVPB 1000 mg/200 mL premix     1,000 mg 200 mL/hr over 60 Minutes Intravenous On call to O.R. 11/08/12 9604 11/08/12 0723     and started on DVT prophylaxis in the form of Xarelto.   PT and OT were ordered for total joint protocol.  Discharge planning consulted to help with postop disposition and equipment needs.  Patient had a rough night on the evening of surgery.  They started to get up OOB with therapy on day one. Hemovac drain was pulled without difficulty.  Continued to work with therapy into day two.  Dressing was changed on day two and the incision was healing well.  Patient was seen in rounds and was ready to go home later that day.   Discharge Medications: Prior to Admission medications   Medication Sig Start Date End Date Taking? Authorizing Provider  celecoxib (CELEBREX) 200 MG  capsule Take 200 mg by mouth daily.   Yes Historical Provider, MD  cyclobenzaprine (FLEXERIL) 10 MG tablet Take 10 mg by mouth at bedtime.    Yes Historical Provider, MD  doxylamine, Sleep, (UNISOM) 25 MG tablet Take 50 mg by mouth at bedtime as needed for sleep.   Yes Historical Provider, MD  escitalopram (LEXAPRO) 20 MG tablet Take 30  mg by mouth daily. Takes 1 and 1/2 tablet   Yes Historical Provider, MD  Eszopiclone (ESZOPICLONE) 3 MG TABS Take 3 mg by mouth at bedtime. Take immediately before bedtime Takes lunesta not generic   Yes Historical Provider, MD  polyethylene glycol (MIRALAX / GLYCOLAX) packet Take 17 g by mouth 2 (two) times daily.   Yes Historical Provider, MD  senna (SENOKOT) 8.6 MG tablet Take 2 tablets by mouth 2 (two) times daily.   Yes Historical Provider, MD  thyroid (ARMOUR) 60 MG tablet Take 120-180 mg by mouth daily before breakfast. Takes 2 tablets every day except Sunday and Wednesday she takes 3 tablets Takes in middle of night   Yes Historical Provider, MD  valACYclovir (VALTREX) 500 MG tablet Take 500 mg by mouth daily.    Yes Historical Provider, MD  methocarbamol (ROBAXIN) 500 MG tablet Take 1 tablet (500 mg total) by mouth every 6 (six) hours as needed. 11/10/12   Alexzandrew Perkins, PA-C  oxyCODONE (OXY IR/ROXICODONE) 5 MG immediate release tablet Take 1-2 tablets (5-10 mg total) by mouth every 3 (three) hours as needed. 11/10/12   Alexzandrew Julien Girt, PA-C  rivaroxaban (XARELTO) 10 MG TABS tablet Take 1 tablet (10 mg total) by mouth daily with breakfast. Take Xarelto for two and a half more weeks, then discontinue Xarelto. Once the patient has completed the Xarelto, they may resume the 81 mg Aspirin. 11/10/12   Alexzandrew Perkins, PA-C  traMADol (ULTRAM) 50 MG tablet Take 1-2 tablets (50-100 mg total) by mouth every 6 (six) hours as needed (mild pain). 11/10/12   Alexzandrew Julien Girt, PA-C   Discharge home with home health  Diet - Regular diet  Follow up - in 2  weeks  Activity - WBAT  Disposition - Home  Condition Upon Discharge - Good  D/C Meds - See DC Summary  DVT Prophylaxis - Xarelto       Discharge Orders   Future Appointments Provider Department Dept Phone   12/28/2012 12:45 PM Annamaria Boots, MD [facility] [phone]   Future Orders Complete By Expires   Call MD / Call 911  As directed    Comments:     If you experience chest pain or shortness of breath, CALL 911 and be transported to the hospital emergency room.  If you develope a fever above 101 F, pus (white drainage) or increased drainage or redness at the wound, or calf pain, call your surgeon's office.   Change dressing  As directed    Comments:     Change dressing daily with sterile 4 x 4 inch gauze dressing and apply TED hose. Do not submerge the incision under water.   Constipation Prevention  As directed    Comments:     Drink plenty of fluids.  Prune juice may be helpful.  You may use a stool softener, such as Colace (over the counter) 100 mg twice a day.  Use MiraLax (over the counter) for constipation as needed.   Diet - low sodium heart healthy  As directed    Discharge instructions  As directed    Comments:     Pick up stool softner and laxative for home. Do not submerge incision under water. May shower. Continue to use ice for pain and swelling from surgery.  Take Xarelto for two and a half more weeks, then discontinue Xarelto. Once the patient has completed the Xarelto, they may resume the 81 mg Aspirin.   Do not put a pillow under  the knee. Place it under the heel.  As directed    Do not sit on low chairs, stoools or toilet seats, as it may be difficult to get up from low surfaces  As directed    Driving restrictions  As directed    Comments:     No driving until released by the physician.   Increase activity slowly as tolerated  As directed    Lifting restrictions  As directed    Comments:     No lifting until released by the  physician.   Patient may shower  As directed    Comments:     You may shower without a dressing once there is no drainage.  Do not wash over the wound.  If drainage remains, do not shower until drainage stops.   TED hose  As directed    Comments:     Use stockings (TED hose) for 3 weeks on both leg(s).  You may remove them at night for sleeping.   Weight bearing as tolerated  As directed        Medication List    STOP taking these medications       aspirin 81 MG tablet     etodolac 400 MG tablet  Commonly known as:  LODINE     Melatonin 3 MG Caps     Vitamin D (Ergocalciferol) 50000 UNITS Caps capsule  Commonly known as:  DRISDOL      TAKE these medications       celecoxib 200 MG capsule  Commonly known as:  CELEBREX  Take 200 mg by mouth daily.     doxylamine (Sleep) 25 MG tablet  Commonly known as:  UNISOM  Take 50 mg by mouth at bedtime as needed for sleep.     escitalopram 20 MG tablet  Commonly known as:  LEXAPRO  Take 30 mg by mouth daily. Takes 1 and 1/2 tablet     FLEXERIL 10 MG tablet  Generic drug:  cyclobenzaprine  Take 10 mg by mouth at bedtime.     methocarbamol 500 MG tablet  Commonly known as:  ROBAXIN  Take 1 tablet (500 mg total) by mouth every 6 (six) hours as needed.     oxyCODONE 5 MG immediate release tablet  Commonly known as:  Oxy IR/ROXICODONE  Take 1-2 tablets (5-10 mg total) by mouth every 3 (three) hours as needed.     polyethylene glycol packet  Commonly known as:  MIRALAX / GLYCOLAX  Take 17 g by mouth 2 (two) times daily.     rivaroxaban 10 MG Tabs tablet  Commonly known as:  XARELTO  - Take 1 tablet (10 mg total) by mouth daily with breakfast. Take Xarelto for two and a half more weeks, then discontinue Xarelto.  - Once the patient has completed the Xarelto, they may resume the 81 mg Aspirin.     senna 8.6 MG tablet  Commonly known as:  SENOKOT  Take 2 tablets by mouth 2 (two) times daily.     thyroid 60 MG tablet    Commonly known as:  ARMOUR  - Take 120-180 mg by mouth daily before breakfast. Takes 2 tablets every day except Sunday and Wednesday she takes 3 tablets  - Takes in middle of night     traMADol 50 MG tablet  Commonly known as:  ULTRAM  Take 1-2 tablets (50-100 mg total) by mouth every 6 (six) hours as needed (mild pain).     valACYclovir 500  MG tablet  Commonly known as:  VALTREX  Take 500 mg by mouth daily.       Follow-up Information   Follow up with Loanne Drilling, MD. Schedule an appointment as soon as possible for a visit in 2 weeks.   Specialty:  Orthopedic Surgery   Contact information:   [address] [place] [address] [phone]       Signed: Patrica Duel 11/24/2012, 9:07 PM

## 2012-11-10 NOTE — Progress Notes (Signed)
   Subjective: 2 Days Post-Op Procedure(s) (LRB): RIGHT TOTAL KNEE ARTHROPLASTY (Right) Patient reports pain as mild.   Patient seen in rounds with Dr. Lequita Halt. Family in room. Patient is well, but has had some minor complaints of pain in the knee, requiring pain medications Patient is ready to go home later today after therapy.  Objective: Vital signs in last 24 hours: Temp:  [98.2 F (36.8 C)-100.1 F (37.8 C)] 98.2 F (36.8 C) (09/17 0544) Pulse Rate:  [81-84] 84 (09/17 0544) Resp:  [15-17] 17 (09/17 0544) BP: (116-130)/(72-77) 119/74 mmHg (09/17 0544) SpO2:  [92 %-95 %] 94 % (09/17 0544)  Intake/Output from previous day:  Intake/Output Summary (Last 24 hours) at 11/10/12 0722 Last data filed at 11/10/12 0418  Gross per 24 hour  Intake   1995 ml  Output   2250 ml  Net   -255 ml    Intake/Output this shift:    Labs:  Recent Labs  11/09/12 0600 11/10/12 0615  HGB 11.6* 11.5*    Recent Labs  11/09/12 0600 11/10/12 0615  WBC 10.0 11.5*  RBC 3.94 3.87  HCT 35.2* 34.2*  PLT 197 198    Recent Labs  11/09/12 0600 11/10/12 0615  NA 139 137  K 3.7 3.8  CL 104 99  CO2 28 29  BUN 12 9  CREATININE 0.53 0.47*  GLUCOSE 120* 112*  CALCIUM 8.4 8.8   No results found for this basename: LABPT, INR,  in the last 72 hours  EXAM: General - Patient is Alert, Appropriate and Oriented Extremity - Neurovascular intact Sensation intact distally Incision - clean, dry, no drainage, healing Motor Function - intact, moving foot and toes well on exam.   Assessment/Plan: 2 Days Post-Op Procedure(s) (LRB): RIGHT TOTAL KNEE ARTHROPLASTY (Right) Procedure(s) (LRB): RIGHT TOTAL KNEE ARTHROPLASTY (Right) Past Medical History  Diagnosis Date  . Migraine 10/88    Spillman  . 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 Fibromyalgia 8/98    Truslow  . 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 Plantar fasciitis   . Osteoarthritis 2007    Deveschwar  . Vitreous  degeneration of right eye   . Nuclear sclerosis   . Impaired hearing     left ear  . Scarlet fever as child  . Constipation   . OSA on CPAP 2/07    uses cpap setting of 10  . Pain last week    left under breast pain    Principal Problem:   OA (osteoarthritis) of knee Active Problems:   Postoperative anemia due to acute blood loss  Estimated body mass index is 30.52 kg/(m^2) as calculated from the following:   Height as of this encounter: 5\' 6"  (1.676 m).   Weight as of this encounter: 85.73 kg (189 lb). Up with therapy Discharge home with home health Diet - Regular diet Follow up - in 2 weeks Activity - WBAT Disposition - Home Condition Upon Discharge - Good D/C Meds - See DC Summary DVT Prophylaxis - Xarelto  Andri Prestia 11/10/2012, 7:22 AM

## 2012-11-10 NOTE — Care Management Note (Signed)
    Page 1 of 1   11/10/2012     1:11:34 PM   CARE MANAGEMENT NOTE 11/10/2012  Patient:  [name], [name]   Account Number:  [id number]  Date Initiated:  11/10/2012  Documentation initiated by:  Colleen Can  Subjective/Objective Assessment:   dx OA rt knee; total knee replacemnt     Action/Plan:   CM spoke with patient. Plans are for patient to return to her home in [name] where spouse will be caregiver. She already has DME. [name] will provide [facility] services.   Anticipated DC Date:  11/10/2012   Anticipated DC Plan:  HOME W HOME HEALTH SERVICES      DC Planning Services  CM consult      [facility] Choice  HOME HEALTH   Choice offered to / List presented to:  C-1 Patient        HH arranged  HH-2 PT      [facility] agency  [facility]   Status of service:  Completed, signed off Medicare Important Message given?  NA - LOS <3 / Initial given by admissions (If response is "NO", the following Medicare IM given date fields will be blank) Date Medicare IM given:   Date Additional Medicare IM given:    Discharge Disposition:  HOME W HOME HEALTH SERVICES  Per UR Regulation:    If discussed at Long Length of Stay Meetings, dates discussed:    Comments:  11/10/2012 Colleen Can BSN RN CCM [phone] [name] will start HHpt services tomorrow 11/11/2012.

## 2012-11-10 NOTE — Evaluation (Signed)
Occupational Therapy Evaluation Patient Details Name: [name] MRN: [id number] DOB: 05-17-1940 Today's Date: 11/10/2012 Time: 4098-1191 OT Time Calculation (min): 33 min  OT Assessment / Plan / Recommendation History of present illness The patient is a 72 year old female who comes in for a preoperative History and Physical. The patient is scheduled for a right total knee arthroplasty to be performed by Dr. Gus Rankin. Aluisio, MD at [facility] on 11/08/2012.   Clinical Impression   Pt tolerated up to 3in1 and shower transfer. Discussed safety with KI wear, DME and RW safety. Supposed to d/c today.    OT Assessment       Follow Up Recommendations  No OT follow up    Barriers to Discharge      Equipment Recommendations  None recommended by OT    Recommendations for Other Services    Frequency       Precautions / Restrictions Precautions Precautions: Knee Precaution Comments: Instructed pt on KI use for amb Required Braces or Orthoses: Knee Immobilizer - Right Knee Immobilizer - Right: Discontinue once straight leg raise with < 10 degree lag Restrictions Weight Bearing Restrictions: No RLE Weight Bearing: Weight bearing as tolerated   Pertinent Vitals/Pain 4/10; reposition, ice, had meds    ADL  Toilet Transfer: Performed;Minimal assistance Toilet Transfer Equipment: Raised toilet seat with arms (or 3-in-1 over toilet) Toileting - Clothing Manipulation and Hygiene: Performed;Minimal assistance Where Assessed - Engineer, mining and Hygiene: Sit to stand from 3-in-1 or toilet Tub/Shower Transfer: Performed;Minimal assistance Tub/Shower Transfer Method:  (step back over ledge) Equipment Used: Rolling walker ADL Comments: Pt and spouse educated on shower transfer sequence and issued shower transfer handout. Practiced shower transfer with husband holding walker steady. Pt educated on wearing KI in/out of shower and sitting for shower unless she prefers  to sponge bathe until SLR and doesnt have to wear KI. Still advised on sitting to shower initially and progress to standing. Husband can assist with LB ADL. Pt needed some cues for hand placement and R LE management. Husband practiced donning/doffing KI.     OT Diagnosis:    OT Problem List:   OT Treatment Interventions:     OT Goals(Current goals can be found in the care plan section) Acute Rehab OT Goals Patient Stated Goal: To get better and go home when able  Visit Information  Last OT Received On: 11/10/12 Assistance Needed: +1 History of Present Illness: The patient is a 72 year old female who comes in for a preoperative History and Physical. The patient is scheduled for a right total knee arthroplasty to be performed by Dr. Gus Rankin. Aluisio, MD at [facility] on 11/08/2012.       Prior Functioning               Vision/Perception     Cognition  Cognition Arousal/Alertness: Awake/alert Behavior During Therapy: WFL for tasks assessed/performed Overall Cognitive Status: Within Functional Limits for tasks assessed    Extremity/Trunk Assessment       Mobility Bed Mobility Bed Mobility: Supine to Sit Supine to Sit: 4: Min assist;With rails Details for Bed Mobility Assistance: cues and assist for sequencing anfd for LE and scooting Transfers Transfers: Sit to Stand;Stand to Sit Sit to Stand: 4: Min assist;With upper extremity assist;From bed;From chair/3-in-1 Stand to Sit: 4: Min assist;With upper extremity assist;To chair/3-in-1 Details for Transfer Assistance: cues for sequencing with UEs and LEs and use of RW     Exercise  Balance     End of Session OT - End of Session Equipment Utilized During Treatment: Rolling walker;Right knee immobilizer Activity Tolerance: Patient tolerated treatment well Patient left: in bed;with call bell/phone within reach;with family/visitor present CPM Right Knee CPM Right Knee: Off  GO     Lennox Laity [phone] 11/10/2012, 11:40 AM

## 2012-11-10 NOTE — Progress Notes (Signed)
Physical Therapy Treatment Patient Details Name: [name] MRN: [id number] DOB: [birth year] Today's Date: 11/10/2012 Time: 1028-1100 PT Time Calculation (min): 32 min  PT Assessment / Plan / Recommendation  History of Present Illness The patient is a 72 year old female who comes in for a preoperative History and Physical. The patient is scheduled for a right total knee arthroplasty to be performed by Dr. Gus Rankin. Aluisio, MD at [facility] on 11/08/2012.   PT Comments   POD # 2 planning to D/C to home today.  Demonstrated and instructed pt and spouse on safe handling tech with transfers/gait/stairs/HEP.   Follow Up Recommendations  Home health PT     Does the patient have the potential to tolerate intense rehabilitation     Barriers to Discharge        Equipment Recommendations  None recommended by PT    Recommendations for Other Services    Frequency 7X/week   Progress towards PT Goals Progress towards PT goals: Progressing toward goals  Plan      Precautions / Restrictions Precautions Precautions: Knee Precaution Comments: Instructed pt on KI use for amb Required Braces or Orthoses: Knee Immobilizer - Right Knee Immobilizer - Right: Discontinue once straight leg raise with < 10 degree lag Restrictions Weight Bearing Restrictions: No RLE Weight Bearing: Weight bearing as tolerated    Pertinent Vitals/Pain C/o 8/10 with TE's ICE applied Pre medicated    Mobility  Bed Mobility Bed Mobility: Not assessed Supine to Sit: 4: Min assist;With rails Details for Bed Mobility Assistance: Pt OOB in recliner Transfers Transfers: Sit to Stand;Stand to Sit Sit to Stand: 5: Supervision;4: Min guard;From chair/3-in-1;From toilet Stand to Sit: 5: Supervision;4: Min guard;To chair/3-in-1;To toilet Details for Transfer Assistance: <25% VC's for safety with turns and increased time Ambulation/Gait Ambulation/Gait Assistance: 5: Supervision;4: Min guard Ambulation Distance  (Feet): 45 Feet Assistive device: Rolling walker Ambulation/Gait Assistance Details: one VC on safety with backward gait  Gait Pattern: Step-to pattern Gait velocity: decreased    Exercises   Total Knee Replacement TE's 10 reps B LE ankle pumps 10 reps knee presses 10 reps heel slides  10 reps SAQ's 10 reps SLR's 10 reps ABD Followed by ICE    PT Goals (current goals can now be found in the care plan section) Acute Rehab PT Goals Patient Stated Goal: To get better and go home when able  Visit Information  Last PT Received On: 11/10/12 Assistance Needed: +1 History of Present Illness: The patient is a 72 year old female who comes in for a preoperative History and Physical. The patient is scheduled for a right total knee arthroplasty to be performed by Dr. Gus Rankin. Aluisio, MD at [facility] on 11/08/2012.    Subjective Data  Patient Stated Goal: To get better and go home when able   Cognition  Cognition Arousal/Alertness: Awake/alert Behavior During Therapy: WFL for tasks assessed/performed Overall Cognitive Status: Within Functional Limits for tasks assessed    Balance     End of Session PT - End of Session Equipment Utilized During Treatment: Gait belt;Right knee immobilizer Activity Tolerance: Patient tolerated treatment well Patient left: in bed;with call bell/phone within reach;with family/visitor present   Felecia Shelling  PTA [facility]  Acute  Rehab Pager      [phone]

## 2012-11-11 ENCOUNTER — Encounter: Payer: Self-pay | Admitting: Obstetrics & Gynecology

## 2012-11-11 DIAGNOSIS — Z471 Aftercare following joint replacement surgery: Secondary | ICD-10-CM | POA: Diagnosis not present

## 2012-11-11 DIAGNOSIS — Z96659 Presence of unspecified artificial knee joint: Secondary | ICD-10-CM | POA: Diagnosis not present

## 2012-11-11 DIAGNOSIS — IMO0001 Reserved for inherently not codable concepts without codable children: Secondary | ICD-10-CM | POA: Diagnosis not present

## 2012-11-11 DIAGNOSIS — Z7901 Long term (current) use of anticoagulants: Secondary | ICD-10-CM | POA: Diagnosis not present

## 2012-11-12 DIAGNOSIS — Z96659 Presence of unspecified artificial knee joint: Secondary | ICD-10-CM | POA: Diagnosis not present

## 2012-11-12 DIAGNOSIS — Z471 Aftercare following joint replacement surgery: Secondary | ICD-10-CM | POA: Diagnosis not present

## 2012-11-12 DIAGNOSIS — Z7901 Long term (current) use of anticoagulants: Secondary | ICD-10-CM | POA: Diagnosis not present

## 2012-11-12 DIAGNOSIS — IMO0001 Reserved for inherently not codable concepts without codable children: Secondary | ICD-10-CM | POA: Diagnosis not present

## 2012-11-15 DIAGNOSIS — Z7901 Long term (current) use of anticoagulants: Secondary | ICD-10-CM | POA: Diagnosis not present

## 2012-11-15 DIAGNOSIS — Z471 Aftercare following joint replacement surgery: Secondary | ICD-10-CM | POA: Diagnosis not present

## 2012-11-15 DIAGNOSIS — IMO0001 Reserved for inherently not codable concepts without codable children: Secondary | ICD-10-CM | POA: Diagnosis not present

## 2012-11-15 DIAGNOSIS — Z96659 Presence of unspecified artificial knee joint: Secondary | ICD-10-CM | POA: Diagnosis not present

## 2012-11-16 DIAGNOSIS — Z471 Aftercare following joint replacement surgery: Secondary | ICD-10-CM | POA: Diagnosis not present

## 2012-11-16 DIAGNOSIS — Z7901 Long term (current) use of anticoagulants: Secondary | ICD-10-CM | POA: Diagnosis not present

## 2012-11-16 DIAGNOSIS — Z96659 Presence of unspecified artificial knee joint: Secondary | ICD-10-CM | POA: Diagnosis not present

## 2012-11-16 DIAGNOSIS — IMO0001 Reserved for inherently not codable concepts without codable children: Secondary | ICD-10-CM | POA: Diagnosis not present

## 2012-11-17 DIAGNOSIS — Z471 Aftercare following joint replacement surgery: Secondary | ICD-10-CM | POA: Diagnosis not present

## 2012-11-17 DIAGNOSIS — IMO0001 Reserved for inherently not codable concepts without codable children: Secondary | ICD-10-CM | POA: Diagnosis not present

## 2012-11-17 DIAGNOSIS — Z96659 Presence of unspecified artificial knee joint: Secondary | ICD-10-CM | POA: Diagnosis not present

## 2012-11-17 DIAGNOSIS — Z7901 Long term (current) use of anticoagulants: Secondary | ICD-10-CM | POA: Diagnosis not present

## 2012-11-18 ENCOUNTER — Other Ambulatory Visit: Payer: Self-pay

## 2012-11-18 DIAGNOSIS — IMO0001 Reserved for inherently not codable concepts without codable children: Secondary | ICD-10-CM | POA: Diagnosis not present

## 2012-11-18 DIAGNOSIS — Z471 Aftercare following joint replacement surgery: Secondary | ICD-10-CM | POA: Diagnosis not present

## 2012-11-18 DIAGNOSIS — Z96659 Presence of unspecified artificial knee joint: Secondary | ICD-10-CM | POA: Diagnosis not present

## 2012-11-18 DIAGNOSIS — Z7901 Long term (current) use of anticoagulants: Secondary | ICD-10-CM | POA: Diagnosis not present

## 2012-11-18 MED ORDER — LUNESTA 3 MG PO TABS: 3.0000 mg | ORAL_TABLET | Freq: Every day | ORAL | Status: DC

## 2012-11-18 NOTE — Telephone Encounter (Signed)
Rx signed and faxed.

## 2012-11-18 NOTE — Telephone Encounter (Signed)
Patient called requesting a refill on Lunesta, Brand Name Only for 90 day supply.  She would like the Rx sent to [facility].

## 2012-11-19 DIAGNOSIS — Z96659 Presence of unspecified artificial knee joint: Secondary | ICD-10-CM | POA: Diagnosis not present

## 2012-11-19 DIAGNOSIS — Z471 Aftercare following joint replacement surgery: Secondary | ICD-10-CM | POA: Diagnosis not present

## 2012-11-19 DIAGNOSIS — IMO0001 Reserved for inherently not codable concepts without codable children: Secondary | ICD-10-CM | POA: Diagnosis not present

## 2012-11-19 DIAGNOSIS — Z7901 Long term (current) use of anticoagulants: Secondary | ICD-10-CM | POA: Diagnosis not present

## 2012-11-22 DIAGNOSIS — Z7901 Long term (current) use of anticoagulants: Secondary | ICD-10-CM | POA: Diagnosis not present

## 2012-11-22 DIAGNOSIS — Z96659 Presence of unspecified artificial knee joint: Secondary | ICD-10-CM | POA: Diagnosis not present

## 2012-11-22 DIAGNOSIS — IMO0001 Reserved for inherently not codable concepts without codable children: Secondary | ICD-10-CM | POA: Diagnosis not present

## 2012-11-22 DIAGNOSIS — Z471 Aftercare following joint replacement surgery: Secondary | ICD-10-CM | POA: Diagnosis not present

## 2012-11-24 ENCOUNTER — Telehealth: Payer: Self-pay | Admitting: Obstetrics & Gynecology

## 2012-11-24 DIAGNOSIS — Z471 Aftercare following joint replacement surgery: Secondary | ICD-10-CM | POA: Diagnosis not present

## 2012-11-24 DIAGNOSIS — Z7901 Long term (current) use of anticoagulants: Secondary | ICD-10-CM | POA: Diagnosis not present

## 2012-11-24 DIAGNOSIS — Z96659 Presence of unspecified artificial knee joint: Secondary | ICD-10-CM | POA: Diagnosis not present

## 2012-11-24 DIAGNOSIS — IMO0001 Reserved for inherently not codable concepts without codable children: Secondary | ICD-10-CM | POA: Diagnosis not present

## 2012-11-24 NOTE — Telephone Encounter (Signed)
Pt would like to reschedule her appointment with Dr. Hyacinth Meeker because she had a total knee replacement. Would nurse to call her.

## 2012-11-24 NOTE — Telephone Encounter (Signed)
Appointment rescheduled.

## 2012-11-26 DIAGNOSIS — Z96659 Presence of unspecified artificial knee joint: Secondary | ICD-10-CM | POA: Diagnosis not present

## 2012-11-26 DIAGNOSIS — Z7901 Long term (current) use of anticoagulants: Secondary | ICD-10-CM | POA: Diagnosis not present

## 2012-11-26 DIAGNOSIS — Z471 Aftercare following joint replacement surgery: Secondary | ICD-10-CM | POA: Diagnosis not present

## 2012-11-26 DIAGNOSIS — IMO0001 Reserved for inherently not codable concepts without codable children: Secondary | ICD-10-CM | POA: Diagnosis not present

## 2012-11-28 DIAGNOSIS — Z96659 Presence of unspecified artificial knee joint: Secondary | ICD-10-CM | POA: Diagnosis not present

## 2012-11-28 DIAGNOSIS — Z7901 Long term (current) use of anticoagulants: Secondary | ICD-10-CM | POA: Diagnosis not present

## 2012-11-28 DIAGNOSIS — IMO0001 Reserved for inherently not codable concepts without codable children: Secondary | ICD-10-CM | POA: Diagnosis not present

## 2012-11-28 DIAGNOSIS — Z471 Aftercare following joint replacement surgery: Secondary | ICD-10-CM | POA: Diagnosis not present

## 2012-11-29 DIAGNOSIS — M25569 Pain in unspecified knee: Secondary | ICD-10-CM | POA: Diagnosis not present

## 2012-11-30 DIAGNOSIS — M25569 Pain in unspecified knee: Secondary | ICD-10-CM | POA: Diagnosis not present

## 2012-12-01 ENCOUNTER — Ambulatory Visit: Payer: Self-pay | Admitting: Obstetrics & Gynecology

## 2012-12-03 ENCOUNTER — Ambulatory Visit: Payer: Self-pay | Admitting: Obstetrics & Gynecology

## 2012-12-03 DIAGNOSIS — M25569 Pain in unspecified knee: Secondary | ICD-10-CM | POA: Diagnosis not present

## 2012-12-06 DIAGNOSIS — M25569 Pain in unspecified knee: Secondary | ICD-10-CM | POA: Diagnosis not present

## 2012-12-08 DIAGNOSIS — M25569 Pain in unspecified knee: Secondary | ICD-10-CM | POA: Diagnosis not present

## 2012-12-10 ENCOUNTER — Ambulatory Visit: Payer: Self-pay | Admitting: Obstetrics & Gynecology

## 2012-12-10 DIAGNOSIS — M25569 Pain in unspecified knee: Secondary | ICD-10-CM | POA: Diagnosis not present

## 2012-12-13 ENCOUNTER — Encounter: Payer: Self-pay | Admitting: Gynecology

## 2012-12-13 DIAGNOSIS — M25569 Pain in unspecified knee: Secondary | ICD-10-CM | POA: Diagnosis not present

## 2012-12-15 ENCOUNTER — Encounter: Payer: Self-pay | Admitting: Gynecology

## 2012-12-15 ENCOUNTER — Ambulatory Visit (INDEPENDENT_AMBULATORY_CARE_PROVIDER_SITE_OTHER): Payer: Medicare Other | Admitting: Gynecology

## 2012-12-15 VITALS — BP 122/62 | Temp 98.2°F | Resp 12 | Ht 67.0 in | Wt 186.0 lb

## 2012-12-15 DIAGNOSIS — K219 Gastro-esophageal reflux disease without esophagitis: Secondary | ICD-10-CM | POA: Diagnosis not present

## 2012-12-15 DIAGNOSIS — N952 Postmenopausal atrophic vaginitis: Secondary | ICD-10-CM

## 2012-12-15 DIAGNOSIS — R32 Unspecified urinary incontinence: Secondary | ICD-10-CM | POA: Diagnosis not present

## 2012-12-15 LAB — POCT URINALYSIS DIPSTICK
Urobilinogen, UA: NEGATIVE
pH, UA: 5

## 2012-12-15 NOTE — Progress Notes (Signed)
Subjective:     Patient ID: [name], female   DOB: 1940-08-30, 72 y.o.   MRN: [id number]  HPI Comments: Pt here reporting clumpy vaginal discharge 72m after knee replacement, we had gvien her 2 branded diflucans which she has taken.  Pt also reports incomplete emptying and bladder discomfort with urination and loss of urine when she coughs or sneezes with a full bladder.  Pt was a flight attendant for years and is accustomed to having a full bladder, she denies any loss with usual bladder fullness or unsolicited.  Lastly she also reports chest pain that she noticed while at the gym doing rehab, she needed to stop and get fresh air. She does report issues with eating late at night and states that she had a pre-op EKG and denies being told she had any cardiac issues during surgery.    Chest Pain  Pertinent negatives include no palpitations.     Review of Systems  Cardiovascular: Positive for chest pain. Negative for palpitations and leg swelling.  Genitourinary: Positive for dysuria, hematuria and vaginal discharge. Negative for frequency, flank pain, vaginal bleeding, enuresis and vaginal pain.       Objective:   Physical Exam  Constitutional: She is oriented to person, place, and time. She appears well-developed and well-nourished.  Genitourinary: Uterus normal. Uterus is not tender. Cervix exhibits no discharge. Right adnexum displays no mass. Left adnexum displays no mass.  Vagina:atrophic changes, no discharge, pH >5.5, nontender  Musculoskeletal:  Moderately good mobility of right knee, scar evident, no signs of infection  Neurological: She is alert and oriented to person, place, and time.  Skin: Skin is warm and dry.  Psychiatric: She has a normal mood and affect.       Assessment:     Recent surgery with history of recurrent yeast infections-appears to have responded to recent diflucan treatment-pt assured GERD dysuria     Plan:     Pt will restart boric acid  capsules-called into [facility] Pt referred to GI to evaluate-discussed dietary modifications Check urine culture-due to issues with recurrent yeast-she prefers to not start antibiotics, discussed increasing voiding frequency and avoidance of bladder irritants-agrees

## 2012-12-15 NOTE — Progress Notes (Signed)
S/w pharmacist "Brook" at [facility] and called in Boric Acid 600 mg capsules #25/0rfs patient is to use it 2x/wk per vagina.

## 2012-12-16 DIAGNOSIS — M25569 Pain in unspecified knee: Secondary | ICD-10-CM | POA: Diagnosis not present

## 2012-12-16 LAB — URINE CULTURE
Colony Count: NO GROWTH
Organism ID, Bacteria: NO GROWTH

## 2012-12-17 ENCOUNTER — Encounter: Payer: Self-pay | Admitting: Gynecology

## 2012-12-17 ENCOUNTER — Telehealth: Payer: Self-pay | Admitting: Gynecology

## 2012-12-17 NOTE — Telephone Encounter (Signed)
Spoke with pt who had a question about her urine culture results. Advised that the culture showed no growth. Pt appreciative. Pt would like paper copy mailed to her.

## 2012-12-17 NOTE — Telephone Encounter (Signed)
Patient calling to discuss recent results. Patient states she is a little confused about MyChart messages.

## 2012-12-17 NOTE — Telephone Encounter (Signed)
Patient returned call. She requested that visit information and last labs be sent to [facility]. Faxed with confirmation received.

## 2012-12-20 DIAGNOSIS — M25569 Pain in unspecified knee: Secondary | ICD-10-CM | POA: Diagnosis not present

## 2012-12-21 DIAGNOSIS — R350 Frequency of micturition: Secondary | ICD-10-CM | POA: Diagnosis not present

## 2012-12-21 DIAGNOSIS — N3020 Other chronic cystitis without hematuria: Secondary | ICD-10-CM | POA: Diagnosis not present

## 2012-12-21 DIAGNOSIS — N3941 Urge incontinence: Secondary | ICD-10-CM | POA: Diagnosis not present

## 2012-12-23 ENCOUNTER — Telehealth: Payer: Self-pay | Admitting: Obstetrics & Gynecology

## 2012-12-23 NOTE — Telephone Encounter (Signed)
Return call to patient.  She states she actually doesn't think she wants to see anyone about reflux issue and states it is not that bad.  She went to urology appointment and she is having some testing done and will be returning to them in a few weeks.  She has appointment with Dr Hyacinth Meeker next week and will discuss it with her then.  For now, does not want referral.

## 2012-12-23 NOTE — Telephone Encounter (Signed)
Patient was referred her to see Dr Ewing Schlein, but wants dr Loreta Ave because she has seen him in the past. She just wanted to call and let us know

## 2012-12-24 DIAGNOSIS — M25569 Pain in unspecified knee: Secondary | ICD-10-CM | POA: Diagnosis not present

## 2012-12-27 ENCOUNTER — Other Ambulatory Visit: Payer: Self-pay

## 2012-12-27 DIAGNOSIS — Z1231 Encounter for screening mammogram for malignant neoplasm of breast: Secondary | ICD-10-CM

## 2012-12-28 ENCOUNTER — Ambulatory Visit (INDEPENDENT_AMBULATORY_CARE_PROVIDER_SITE_OTHER): Payer: Medicare Other | Admitting: Obstetrics & Gynecology

## 2012-12-28 ENCOUNTER — Encounter: Payer: Self-pay | Admitting: Obstetrics & Gynecology

## 2012-12-28 VITALS — BP 140/78 | HR 68 | Resp 16 | Ht 66.25 in | Wt 184.4 lb

## 2012-12-28 DIAGNOSIS — Z01419 Encounter for gynecological examination (general) (routine) without abnormal findings: Secondary | ICD-10-CM

## 2012-12-28 DIAGNOSIS — Z124 Encounter for screening for malignant neoplasm of cervix: Secondary | ICD-10-CM | POA: Diagnosis not present

## 2012-12-28 MED ORDER — VALACYCLOVIR HCL 1 G PO TABS: 1000.0000 mg | ORAL_TABLET | Freq: Every day | ORAL | Status: DC

## 2012-12-28 MED ORDER — FLUCONAZOLE 150 MG PO TABS: ORAL_TABLET | ORAL | Status: DC

## 2012-12-28 NOTE — Patient Instructions (Signed)

## 2012-12-28 NOTE — Progress Notes (Signed)
72 y.o. G0P0 MarriedCaucasianF here for annual exam.  Had parasite infection from traveling in [place].  Saw four different physicians before this was diagnosed.  Last blood work done with Dr. Evlyn Kanner.     Patient's last menstrual period was 02/25/1995.          Sexually active: yes  The current method of family planning is none.    Exercising: yes  water aerobics Smoker:  No worked in smoke filled environment for 30 years  Health Maintenance: Pap:  2013 History of abnormal Pap:  no MMG:  12/09/11 normal, 3D Colonoscopy:  07/15/10, Dr. Loreta Ave, tubular adenoma BMD:   7/09 -1.4/-1.4 TDaP:  8/12 Shingles:  Age 6 Screening Labs: PCP, Hb today: PCP, Urine today: urologist   reports that she has never smoked. She has never used smokeless tobacco. She reports that she drinks alcohol. She reports that she does not use illicit drugs.  Past Medical History  Diagnosis Date  . Migraine 10/88    [place]  . 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 Fibromyalgia 8/98    [place]  . 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 Plantar fasciitis   . Osteoarthritis 2007    [place]war  . Vitreous degeneration of right eye   . Nuclear sclerosis   . Impaired hearing     left ear  . Scarlet fever as child  . Constipation   . OSA on CPAP 2/07    uses cpap setting of 10  . Pain last week    left under breast pain     Past Surgical History  Procedure Laterality Date  . Tonsillectomy and adenoidectomy  1952  . De quervain's release Right 10/97    Sypher  . Breast biopsy Left 6/00    [place]  . 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 Tubal ligation    . Left breast biopsy    . Tonsillectomy  age 17  . Total knee arthroplasty Right 11/08/2012    Procedure: RIGHT TOTAL KNEE ARTHROPLASTY;  Surgeon: Loanne Drilling, MD;  Location: [facility] ORS;  Service: Orthopedics;  Laterality: Right;    Current Outpatient Prescriptions  Medication Sig Dispense Refill  . celecoxib (CELEBREX) 200 MG capsule Take 200  mg by mouth daily.      . cyclobenzaprine (FLEXERIL) 10 MG tablet Take 10 mg by mouth at bedtime.       Marland Kitchen doxylamine, Sleep, (UNISOM) 25 MG tablet Take 50 mg by mouth at bedtime as needed for sleep.      Marland Kitchen escitalopram (LEXAPRO) 20 MG tablet Take 30 mg by mouth daily. Takes 1 and 1/2 tablet      . Eszopiclone (ESZOPICLONE) 3 MG TABS Take 3 mg by mouth at bedtime. Take immediately before bedtime      . polyethylene glycol (MIRALAX / GLYCOLAX) packet Take 17 g by mouth 2 (two) times daily.      Marland Kitchen thyroid (ARMOUR) 60 MG tablet Take 120-180 mg by mouth daily before breakfast. Takes 2 tablets every day except Sunday and Wednesday she takes 3 tablets Takes in middle of night      . traMADol (ULTRAM) 50 MG tablet Take 1-2 tablets (50-100 mg total) by mouth every 6 (six) hours as needed (mild pain).  60 tablet  0  . valACYclovir (VALTREX) 500 MG tablet Take 500 mg by mouth daily.       Marland Kitchen oxyCODONE (OXY IR/ROXICODONE) 5 MG immediate release tablet Take 1-2 tablets (5-10 mg total)  by mouth every 3 (three) hours as needed.  80 tablet  0  . rivaroxaban (XARELTO) 10 MG TABS tablet Take 1 tablet (10 mg total) by mouth daily with breakfast. Take Xarelto for two and a half more weeks, then discontinue Xarelto. Once the patient has completed the Xarelto, they may resume the 81 mg Aspirin.  19 tablet  0  . senna (SENOKOT) 8.6 MG tablet Take 2 tablets by mouth 2 (two) times daily.       No current facility-administered medications for this visit.    Family History  Problem Relation Age of Onset  . Breast cancer Mother   . Breast cancer Maternal Aunt   . Breast cancer Maternal Aunt   . Breast cancer Maternal Aunt   . Breast cancer Maternal Aunt     ROS:  Pertinent items are noted in HPI.  Otherwise, a comprehensive ROS was negative.  Exam:   BP 140/78  Pulse 68  Resp 16  Ht 5' 6.25" (1.683 m)  Wt 184 lb 6.4 oz (83.643 kg)  BMI 29.53 kg/m2  LMP 02/25/1995  Weight change: -9lbs   Height: 5' 6.25"  (168.3 cm)  Ht Readings from Last 3 Encounters:  12/28/12 5' 6.25" (1.683 m)  12/15/12 5\' 7"  (1.702 m)  11/08/12 5\' 6"  (1.676 m)    General appearance: alert, cooperative and appears stated age Head: Normocephalic, without obvious abnormality, atraumatic Neck: no adenopathy, supple, symmetrical, trachea midline and thyroid normal to inspection and palpation Lungs: clear to auscultation bilaterally Breasts: normal appearance, no masses or tenderness Heart: regular rate and rhythm Abdomen: soft, non-tender; bowel sounds normal; no masses,  no organomegaly Extremities: extremities normal, atraumatic, no cyanosis or edema Skin: Skin color, texture, turgor normal. No rashes or lesions Lymph nodes: Cervical, supraclavicular, and axillary nodes normal. No abnormal inguinal nodes palpated Neurologic: Grossly normal   Pelvic: External genitalia:  no lesions              Urethra:  normal appearing urethra with no masses, tenderness or lesions              Bartholins and Skenes: normal                 Vagina: normal appearing vagina with normal color and discharge, no lesions              Cervix: no lesions              Pap taken: no Bimanual Exam:  Uterus:  normal size, contour, position, consistency, mobility, non-tender              Adnexa: normal adnexa and no mass, fullness, tenderness               Rectovaginal: Confirms               Anus:  normal sphincter tone, no lesions  A:  Well Woman with normal exam PMP, no HRT Weight loss (pt working on this) Fibromyalgia Family hx of breast cancer Dense breasts  P:   Mammogram yearly.  3D MMG discussed. pap smear done 2013.  No Pap today. Valtrex 1 gm daily.  #90/4RF return annually or prn  An After Visit Summary was printed and given to the patient.

## 2012-12-30 ENCOUNTER — Other Ambulatory Visit: Payer: Self-pay

## 2013-01-07 NOTE — Telephone Encounter (Signed)
Dr Hyacinth Meeker, may we close this referral?

## 2013-01-07 NOTE — Telephone Encounter (Signed)
Office calling stating that the patient does not want referral to office. She didn't know if patient just didn't want to be seen there or if she didn't want the referral at all. It looks like sally already spoke with patient previously about this.

## 2013-01-25 ENCOUNTER — Ambulatory Visit
Admission: RE | Admit: 2013-01-25 | Discharge: 2013-01-25 | Disposition: A | Discharge status: Home / Self Care | Payer: Medicare Other | Source: Ambulatory Visit

## 2013-01-25 DIAGNOSIS — Z1231 Encounter for screening mammogram for malignant neoplasm of breast: Secondary | ICD-10-CM

## 2013-01-27 NOTE — Telephone Encounter (Signed)
May I close this referral?

## 2013-01-28 ENCOUNTER — Telehealth: Payer: Self-pay

## 2013-01-28 NOTE — Telephone Encounter (Signed)
Patient called stating she got the Rx for Lunesta, but the pharmacy only sent her #60 and told her that's what we wrote the Rx for.  States she told them she will only talk to an Probation officer" and was transferred to a supervisor named Steward Drone, who also told her the Rx we sent was for #60.  I reviewed the patients chart and see that we wrote the Rx for #90 + 1 refill in Sept.  I am unsure why the Pharmacy says it is only for #60.  I called [facility].  Spoke with Clydie Braun.  At first, she said the Rx was written for #60, but after further investigation, she saw that we did send an Rx for #90.  She said she has noted the account that they are to fill #90 each time and have cancelled the order that said #60 in their system so it does not get filled agian.  I called the patient back, got no answer.  Left message.

## 2013-01-31 DIAGNOSIS — N3020 Other chronic cystitis without hematuria: Secondary | ICD-10-CM | POA: Diagnosis not present

## 2013-01-31 DIAGNOSIS — R351 Nocturia: Secondary | ICD-10-CM | POA: Diagnosis not present

## 2013-02-02 NOTE — Telephone Encounter (Signed)
Dr Hyacinth Meeker,   We are just double checking that no further follow up needed since patient declined referral?  Need your ok to remove.

## 2013-02-02 NOTE — Telephone Encounter (Signed)
Ok to close encounter.  She will call if changes mind.  i know her well.

## 2013-02-03 ENCOUNTER — Encounter: Payer: Self-pay | Admitting: Obstetrics & Gynecology

## 2013-03-09 DIAGNOSIS — L723 Sebaceous cyst: Secondary | ICD-10-CM | POA: Diagnosis not present

## 2013-03-09 DIAGNOSIS — L821 Other seborrheic keratosis: Secondary | ICD-10-CM | POA: Diagnosis not present

## 2013-03-09 DIAGNOSIS — D239 Other benign neoplasm of skin, unspecified: Secondary | ICD-10-CM | POA: Diagnosis not present

## 2013-03-09 DIAGNOSIS — Z8049 Family history of malignant neoplasm of other genital organs: Secondary | ICD-10-CM | POA: Diagnosis not present

## 2013-03-09 DIAGNOSIS — L739 Follicular disorder, unspecified: Secondary | ICD-10-CM | POA: Diagnosis not present

## 2013-03-11 DIAGNOSIS — Z1331 Encounter for screening for depression: Secondary | ICD-10-CM | POA: Diagnosis not present

## 2013-03-11 DIAGNOSIS — R5383 Other fatigue: Secondary | ICD-10-CM | POA: Diagnosis not present

## 2013-03-11 DIAGNOSIS — E559 Vitamin D deficiency, unspecified: Secondary | ICD-10-CM | POA: Diagnosis not present

## 2013-03-11 DIAGNOSIS — G4730 Sleep apnea, unspecified: Secondary | ICD-10-CM | POA: Diagnosis not present

## 2013-03-11 DIAGNOSIS — E038 Other specified hypothyroidism: Secondary | ICD-10-CM | POA: Diagnosis not present

## 2013-03-11 DIAGNOSIS — Z6831 Body mass index (BMI) 31.0-31.9, adult: Secondary | ICD-10-CM | POA: Diagnosis not present

## 2013-03-11 DIAGNOSIS — IMO0001 Reserved for inherently not codable concepts without codable children: Secondary | ICD-10-CM | POA: Diagnosis not present

## 2013-03-11 DIAGNOSIS — E785 Hyperlipidemia, unspecified: Secondary | ICD-10-CM | POA: Diagnosis not present

## 2013-03-11 DIAGNOSIS — R5381 Other malaise: Secondary | ICD-10-CM | POA: Diagnosis not present

## 2013-03-14 DIAGNOSIS — IMO0001 Reserved for inherently not codable concepts without codable children: Secondary | ICD-10-CM | POA: Diagnosis not present

## 2013-03-14 DIAGNOSIS — M5030 Other cervical disc degeneration, unspecified cervical region: Secondary | ICD-10-CM | POA: Diagnosis not present

## 2013-03-14 DIAGNOSIS — R5381 Other malaise: Secondary | ICD-10-CM | POA: Diagnosis not present

## 2013-03-14 DIAGNOSIS — M76899 Other specified enthesopathies of unspecified lower limb, excluding foot: Secondary | ICD-10-CM | POA: Diagnosis not present

## 2013-03-14 DIAGNOSIS — R5383 Other fatigue: Secondary | ICD-10-CM | POA: Diagnosis not present

## 2013-03-16 ENCOUNTER — Telehealth: Payer: Self-pay | Admitting: Neurology

## 2013-03-16 ENCOUNTER — Telehealth: Payer: Self-pay

## 2013-03-16 DIAGNOSIS — G4700 Insomnia, unspecified: Secondary | ICD-10-CM

## 2013-03-16 DIAGNOSIS — E559 Vitamin D deficiency, unspecified: Secondary | ICD-10-CM | POA: Diagnosis not present

## 2013-03-16 DIAGNOSIS — G4730 Sleep apnea, unspecified: Principal | ICD-10-CM

## 2013-03-16 NOTE — Telephone Encounter (Signed)
Patient called, left message saying she needs a new Rx for Lunesta 3mg  for 90 days sent to [facility] because she doesn't have any refills.  We already sent this Rx in Nov for 90 days plus one refill.  We contacted [facility] at that time and they verified they did have the Rx (Please see phone note).  Patient seems to have an issue every time she fills this med, as she calls [place] each time she needs a refill saying there are none on file even though we verify there are refills on file.  I called [facility].  Spoke with Jana Half.  She verified the patient still has a 90 day Rx on file with one additional refill, brand name only.  Says the patient must call them to get the Rx filled, as we cannot initiate that for her, because they need to speak to the patient directly.  I called the patient back.  Got no answer.  Left message.

## 2013-03-16 NOTE — Telephone Encounter (Signed)
Patient calling to make an appointment with Dr. Brett Fairy, per [facility] last office visit was 03-19-12. Offered patient first available in August per Dr Dohmeier's schedule, patient states she cannot wait that long and needs to be seen sooner. Please call the patient to schedule.

## 2013-03-16 NOTE — Telephone Encounter (Signed)
Called to sched patient for sooner appt with physician, left VM message for call back to schedule

## 2013-03-17 ENCOUNTER — Telehealth: Payer: Self-pay | Admitting: *Deleted

## 2013-03-17 MED ORDER — ESZOPICLONE 3 MG PO TABS: 3.0000 mg | ORAL_TABLET | Freq: Every day | ORAL | Status: DC

## 2013-03-17 NOTE — Telephone Encounter (Signed)
The pharmacy already has a Rx on file, and prior [name] has already been approved.

## 2013-03-17 NOTE — Telephone Encounter (Signed)
i refilled lunesta, which may need a pre-authorization. She can probably pay for one treatment course and soon have the refills insurance covered. Does she want mail or have prescription faxed ?

## 2013-03-17 NOTE — Addendum Note (Signed)
Addended by: Larey Seat on: 03/17/2013 05:20 PM   Modules accepted: Orders

## 2013-03-17 NOTE — Telephone Encounter (Signed)
I called ins back.  Was on the phone for nearly one hour.  Spoke with Audrea Muscat.  They said this med has already been approved 90 per 90 days.  It is currently a refill too soon and ins will not pay for a refill until 01/25.  They ran a test claim and verified it is covered.  I called the patient back.  Advised I was told Rx was covered #90 per 90 days, it was just too soon.  She will follow up with ins on the 25th and call us back if needed.

## 2013-03-17 NOTE — Telephone Encounter (Addendum)
Called to schedule yearly f/u appt, confirmed with patient  Patient is requesting auth.(case # [id number] day supply of lunesta [facility].She is almost out of the medication #[id number]. Please call them press O to speak with someone.  Patient said that she has called them at least 4 times today and medication not there

## 2013-03-18 ENCOUNTER — Encounter: Payer: Self-pay | Admitting: Neurology

## 2013-03-18 ENCOUNTER — Telehealth: Payer: Self-pay | Admitting: Neurology

## 2013-03-18 NOTE — Telephone Encounter (Signed)
Ms. Bazzle called in to let us know that [facility] has confirmed her order, that it will take 3-5 days to process and up to 2 weeks to send out.  She wanted to thank you and everyone else that assisted with this process.  She was very happy and grateful that it has been resolved.  No call back needed.

## 2013-03-18 NOTE — Telephone Encounter (Signed)
I have already contacted [facility] multiple times regarding this Rx.  They have indicated each time that a 90 day Rx was already on file and even said that they have to wait until 01/25 to fill the Rx because it is too soon. As well, they indicated an approval was already on file for #90 tabs per 90 days.  I called them back again.  Spoke with Stanton Kidney.  She again verified that the patient has a Rx on file #90 for 90 days, has an approval on file #90 for 90 days, and ins will pay for refills on 01/25.  She also said (which I noted before) that the patient has to call to them initiate the refill, as they have to verify info before shipment.  She gave me the Rx # [id number].  Says patient must call them back to request Rx.  I called [facility], spoke with [employer].  They will fill a small supply of Lunesta for the patient while she is waiting on mail order.  I called the patient back and relayed all info.  She will follow up with the pharmacy and will call us back if needed.

## 2013-03-18 NOTE — Telephone Encounter (Signed)
Pt called this morning regarding her Rx from [facility].  She stated that sometime overnight [facility] approved her 90 day supply, however Bryan at [employer] stated that we would need to submit a 7-10 day supply of the Rx to her local pharmacy [facility]) so that she will have her medication until [facility] receives the new 90 day Rx and them to send it out to her.  She states that she has been told 9-10 different things from [facility] and is very frustration with them.  Please see prior notes from 03/16/13 and 03/17/13.  Please contact patient.

## 2013-03-23 ENCOUNTER — Telehealth: Payer: Self-pay | Admitting: Neurology

## 2013-03-23 NOTE — Telephone Encounter (Signed)
There is already an approval on file for 90 tabs per 90 days per ins (please see previous notes) Ref # [id number].  I called Elizabeth back.  She said the patient called them on 01/22 and they are just now calling us.  I explained this was already approved and also told her how much trouble the patient has been having with mail order.  She asked that we send them a copy of the approval letter to [phone] Attention [place] that they will try to figure out where the disconnect is, since it seems as though we've done everything we possibly can on our end. Approval letter has been sent.

## 2013-03-23 NOTE — Telephone Encounter (Signed)
Benjamine Mola with [facility] called for Ms. Hopfensperger.  She states that [facility] requires a prior authorization for 90 days/90 pills of Lunestra.  The prior authorization phone number is: [phone].  Please call Benjamine Mola back once this has been completed so that she can notify Ms. Warchol.  Thank you

## 2013-03-24 ENCOUNTER — Encounter: Payer: Self-pay | Admitting: Neurology

## 2013-03-29 ENCOUNTER — Telehealth: Payer: Self-pay | Admitting: Neurology

## 2013-03-29 NOTE — Telephone Encounter (Signed)
[name] from [employer] just calling to make aware that [name] has been approved 90 pills for 90 days. If questions, please call her back.

## 2013-04-06 ENCOUNTER — Ambulatory Visit (INDEPENDENT_AMBULATORY_CARE_PROVIDER_SITE_OTHER): Payer: Medicare Other | Admitting: Neurology

## 2013-04-06 ENCOUNTER — Encounter: Payer: Self-pay | Admitting: Neurology

## 2013-04-06 VITALS — BP 120/76 | HR 76 | Resp 18 | Ht 66.5 in | Wt 190.0 lb

## 2013-04-06 DIAGNOSIS — Z9989 Dependence on other enabling machines and devices: Principal | ICD-10-CM

## 2013-04-06 DIAGNOSIS — G4733 Obstructive sleep apnea (adult) (pediatric): Secondary | ICD-10-CM | POA: Insufficient documentation
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
## 2013-04-06 NOTE — Patient Instructions (Signed)
Vitamin D Deficiency  Vitamin D is an important vitamin that your body needs. Having too little of it in your body is called a deficiency. A very bad deficiency can make your bones soft and can cause a condition called rickets.   Vitamin D is important to your body for different reasons, such as:   · It helps your body absorb 2 minerals called calcium and phosphorus.  · It helps make your bones healthy.  · It may prevent some diseases, such as diabetes and multiple sclerosis.  · It helps your muscles and heart.  You can get vitamin D in several ways. It is a natural part of some foods. The vitamin is also added to some dairy products and cereals. Some people take vitamin D supplements. Also, your body makes vitamin D when you are in the sun. It changes the sun's rays into a form of the vitamin that your body can use.  CAUSES   · Not eating enough foods that contain vitamin D.  · Not getting enough sunlight.  · Having certain digestive system diseases that make it hard to absorb vitamin D. These diseases include Crohn's disease, chronic pancreatitis, and cystic fibrosis.  · Having a surgery in which part of the stomach or small intestine is removed.  · Being obese. Fat cells pull vitamin D out of your blood. That means that obese people may not have enough vitamin D left in their blood and in other body tissues.  · Having chronic kidney or liver disease.  RISK FACTORS  Risk factors are things that make you more likely to develop a vitamin D deficiency. They include:  · Being older.  · Not being able to get outside very much.  · Living in a nursing home.  · Having had broken bones.  · Having weak or thin bones (osteoporosis).  · Having a disease or condition that changes how your body absorbs vitamin D.  · Having dark skin.  · Some medicines such as seizure medicines or steroids.  · Being overweight or obese.  SYMPTOMS  Mild cases of vitamin D deficiency may not have any symptoms. If you have a very bad case, symptoms  may include:  · Bone pain.  · Muscle pain.  · Falling often.  · Broken bones caused by a minor injury, due to osteoporosis.  DIAGNOSIS  A blood test is the best way to tell if you have a vitamin D deficiency.  TREATMENT  Vitamin D deficiency can be treated in different ways. Treatment for vitamin D deficiency depends on what is causing it. Options include:  · Taking vitamin D supplements.  · Taking a calcium supplement. Your caregiver will suggest what dose is best for you.  HOME CARE INSTRUCTIONS  · Take any supplements that your caregiver prescribes. Follow the directions carefully. Take only the suggested amount.  · Have your blood tested 2 months after you start taking supplements.  · Eat foods that contain vitamin D. Healthy choices include:  · Fortified dairy products, cereals, or juices. Fortified means vitamin D has been added to the food. Check the label on the package to be sure.  · Fatty fish like salmon or trout.  · Eggs.  · Oysters.  · Do not use a tanning bed.  · Keep your weight at a healthy level. Lose weight if you need to.  · Keep all follow-up appointments. Your caregiver will need to perform blood tests to make sure your vitamin D deficiency   is going away.  SEEK MEDICAL CARE IF:  · You have any questions about your treatment.  · You continue to have symptoms of vitamin D deficiency.  · You have nausea or vomiting.  · You are constipated.  · You feel confused.  · You have severe abdominal or back pain.  MAKE SURE YOU:  · Understand these instructions.  · Will watch your condition.  · Will get help right away if you are not doing well or get worse.  Document Released: 05/05/2011 Document Revised: 06/07/2012 Document Reviewed: 05/05/2011  ExitCare® Patient Information ©2014 [employer].

## 2013-04-18 ENCOUNTER — Encounter: Payer: Self-pay | Admitting: Nurse Practitioner

## 2013-04-18 ENCOUNTER — Ambulatory Visit (INDEPENDENT_AMBULATORY_CARE_PROVIDER_SITE_OTHER): Payer: Medicare Other | Admitting: Nurse Practitioner

## 2013-04-18 VITALS — BP 112/76 | HR 72 | Temp 98.0°F | Resp 16 | Ht 66.25 in | Wt 192.0 lb

## 2013-04-18 DIAGNOSIS — B373 Candidiasis of vulva and vagina: Secondary | ICD-10-CM | POA: Diagnosis not present

## 2013-04-18 DIAGNOSIS — B3731 Acute candidiasis of vulva and vagina: Secondary | ICD-10-CM | POA: Diagnosis not present

## 2013-04-18 MED ORDER — NYSTATIN-TRIAMCINOLONE 100000-0.1 UNIT/GM-% EX OINT: 1.0000 "application " | TOPICAL_OINTMENT | Freq: Two times a day (BID) | CUTANEOUS | Status: DC

## 2013-04-18 MED ORDER — FLUCONAZOLE 150 MG PO TABS: ORAL_TABLET | ORAL | Status: DC

## 2013-04-18 MED ORDER — ESTROGENS, CONJUGATED 0.625 MG/GM VA CREA: 1.0000 | TOPICAL_CREAM | Freq: Every day | VAGINAL | Status: DC

## 2013-04-18 NOTE — Progress Notes (Signed)
Subjective:     Patient ID: [name], female   DOB: 1940/04/06, 73 y.o.   MRN: [id number]  HPI  This 73 yo WM Fe complains of vaginitis symptoms.  She took Diflucan on 2/11 and 2/14 without change in symptoms.   Went to [place] for a week.  There was a change of soaps. Now symptoms past 3-4 days with itching,  Had yogurt a lot while away trying to improve symptoms on their own.  At [facility] Dr. Sabra Heck gave her Vagifem for which she has been non compliant.  Her brother was diagnosed with kidney cancer. because of that concern she was seen by urologist Dr. McDiarmid to rule any kidney disease and he also advised her to use Premarin vaginal cream.   Review of Systems  Constitutional: Negative for fever, chills and fatigue.  Respiratory: Negative.   Cardiovascular: Negative.   Gastrointestinal: Negative.   Genitourinary: Positive for vaginal discharge and vaginal pain. Negative for dysuria, urgency, frequency, hematuria, flank pain, pelvic pain and dyspareunia.  Musculoskeletal: Negative.   Skin: Negative.   Neurological: Negative.   Psychiatric/Behavioral: Negative.        Objective:   Physical Exam  Constitutional: She appears well-developed and well-nourished. No distress.  Abdominal: Soft. She exhibits no distension. There is no tenderness. There is no rebound.  Genitourinary:  Thin white vaginal discharge.  There is redness around the introitus without lesions.  No tenderness at the bladder.  Very atrophic vaginal changes. Wet prep: PH: 5.5; NSS: negative; KOH: + yeast.  Neurological: She is alert.  Psychiatric: She has a normal mood and affect. Her behavior is normal. Judgment and thought content normal.       Assessment:     Acute and chronic yeast  Atrophic vaginitis    Plan:     Diflucan 150 mg X 2 Unable to use Monistat or Terazol secondary to allergy Will also give her Nystatin to use externally BID until symptoms are improved.   She is given a refill on Premarin Vaginal  cream

## 2013-04-18 NOTE — Patient Instructions (Signed)
Monilial Vaginitis  Vaginitis in a soreness, swelling and redness (inflammation) of the vagina and vulva. Monilial vaginitis is not a sexually transmitted infection.  CAUSES   Yeast vaginitis is caused by yeast (candida) that is normally found in your vagina. With a yeast infection, the candida has overgrown in number to a point that upsets the chemical balance.  SYMPTOMS   · White, thick vaginal discharge.  · Swelling, itching, redness and irritation of the vagina and possibly the lips of the vagina (vulva).  · Burning or painful urination.  · Painful intercourse.  DIAGNOSIS   Things that may contribute to monilial vaginitis are:  · Postmenopausal and virginal states.  · Pregnancy.  · Infections.  · Being tired, sick or stressed, especially if you had monilial vaginitis in the past.  · Diabetes. Good control will help lower the chance.  · Birth control pills.  · Tight fitting garments.  · Using bubble bath, feminine sprays, douches or deodorant tampons.  · Taking certain medications that kill germs (antibiotics).  · Sporadic recurrence can occur if you become ill.  TREATMENT   Your caregiver will give you medication.  · There are several kinds of anti monilial vaginal creams and suppositories specific for monilial vaginitis. For recurrent yeast infections, use a suppository or cream in the vagina 2 times a week, or as directed.  · Anti-monilial or steroid cream for the itching or irritation of the vulva may also be used. Get your caregiver's permission.  · Painting the vagina with methylene blue solution may help if the monilial cream does not work.  · Eating yogurt may help prevent monilial vaginitis.  HOME CARE INSTRUCTIONS   · Finish all medication as prescribed.  · Do not have sex until treatment is completed or after your caregiver tells you it is okay.  · Take warm sitz baths.  · Do not douche.  · Do not use tampons, especially scented ones.  · Wear cotton underwear.  · Avoid tight pants and panty  hose.  · Tell your sexual partner that you have a yeast infection. They should go to their caregiver if they have symptoms such as mild rash or itching.  · Your sexual partner should be treated as well if your infection is difficult to eliminate.  · Practice safer sex. Use condoms.  · Some vaginal medications cause latex condoms to fail. Vaginal medications that harm condoms are:  · Cleocin cream.  · Butoconazole (Femstat®).  · Terconazole (Terazol®) vaginal suppository.  · Miconazole (Monistat®) (may be purchased over the counter).  SEEK MEDICAL CARE IF:   · You have a temperature by mouth above 102° F (38.9° C).  · The infection is getting worse after 2 days of treatment.  · The infection is not getting better after 3 days of treatment.  · You develop blisters in or around your vagina.  · You develop vaginal bleeding, and it is not your menstrual period.  · You have pain when you urinate.  · You develop intestinal problems.  · You have pain with sexual intercourse.  Document Released: 11/20/2004 Document Revised: 05/05/2011 Document Reviewed: 08/04/2008  ExitCare® Patient Information ©2014 [employer].

## 2013-04-19 ENCOUNTER — Telehealth: Payer: Self-pay | Admitting: Nurse Practitioner

## 2013-04-19 MED ORDER — FLUCONAZOLE 150 MG PO TABS: ORAL_TABLET | ORAL | Status: DC

## 2013-04-19 NOTE — Telephone Encounter (Signed)
[facility], patient notified.  Routing to provider for final review. Patient agreeable to disposition. Will close encounter

## 2013-04-19 NOTE — Telephone Encounter (Signed)
Patient was seen yesterday and Diflucan was sent to [facility], they do not have brand name Diflucan. Patient would like Diflucan (name brand only) sent to [facility]. Please call patient when this has been sent to [facility].

## 2013-04-21 NOTE — Progress Notes (Signed)
Encounter reviewed by Dr. Brook Silva.  

## 2013-04-30 DIAGNOSIS — J019 Acute sinusitis, unspecified: Secondary | ICD-10-CM | POA: Diagnosis not present

## 2013-05-05 ENCOUNTER — Telehealth: Payer: Self-pay | Admitting: Gynecology

## 2013-05-05 NOTE — Telephone Encounter (Signed)
Pt has a yeast infection and would like to see Dr Charlies Constable. Has certain times she can come in.

## 2013-05-05 NOTE — Telephone Encounter (Signed)
Spoke with pt who recently was treated for sinusitis with an antibiotic and now has itching and discharge. Pt has history of chronic yeast infections and wants to talk to TL about how to manage long term without needing so many medications. Sched OV with TL tomorrow at 7 am.

## 2013-05-06 ENCOUNTER — Ambulatory Visit (INDEPENDENT_AMBULATORY_CARE_PROVIDER_SITE_OTHER): Payer: Medicare Other | Admitting: Gynecology

## 2013-05-06 ENCOUNTER — Encounter: Payer: Self-pay | Admitting: Gynecology

## 2013-05-06 VITALS — BP 134/72 | HR 68 | Resp 16 | Ht 66.25 in | Wt 187.0 lb

## 2013-05-06 DIAGNOSIS — J322 Chronic ethmoidal sinusitis: Secondary | ICD-10-CM | POA: Diagnosis not present

## 2013-05-06 DIAGNOSIS — B3731 Acute candidiasis of vulva and vagina: Secondary | ICD-10-CM

## 2013-05-06 DIAGNOSIS — N952 Postmenopausal atrophic vaginitis: Secondary | ICD-10-CM

## 2013-05-06 DIAGNOSIS — B373 Candidiasis of vulva and vagina: Secondary | ICD-10-CM

## 2013-05-06 MED ORDER — ESTRADIOL 10 MCG VA TABS: 1.0000 | ORAL_TABLET | VAGINAL | Status: DC
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
## 2013-05-06 NOTE — Patient Instructions (Signed)
Use A&D ointment to protect labia when swimming Apply mycolog to affected area twice a day    Cocoanut oil vagifem emerita with aloe

## 2013-05-16 ENCOUNTER — Telehealth: Payer: Self-pay | Admitting: Neurology

## 2013-05-16 NOTE — Telephone Encounter (Signed)
Patient states [facility] needs new Rx for Lunesta 3mg  90 day supply--thank you

## 2013-05-16 NOTE — Telephone Encounter (Signed)
I called [facility] back at the number provided by the patient.  Spoke with Estill Bamberg.  She said she was not able to assist me.  She transferred me to [place] at [phone].  [facility] said they do have the Rx for [place] Name Only #90 with one additional refill.  She says they patient has not given them enough time to process the order.  I called [facility].  Spoke with Cori.  She said the patient has a Rx on file with them for #10 tabs if needed while waiting on mail order.  I called the patient back.  Got no answer.  Left detailed message with Case # and Rx# listed on the fax.  (please see previous note).  Unfortunately, [name] is no longer sampled, as it went generic, but [employer] [facility] has small Rx on file if she needs that before mail order arrives.  Asked her to call us back if anything further is needed.

## 2013-05-16 NOTE — Telephone Encounter (Signed)
Pt called back after having to get off the phone abruptly. She wanted to make sure that she had given all the information.  She states that if someone could call [phone] and state that she needs the name brand only they will send out 60 days.  She also stated that per [facility] they never received any refill information. Please reference the other telephone message dated 05-16-13.  Please call to discuss as necessary.

## 2013-05-16 NOTE — Telephone Encounter (Signed)
A 90 day Rx plus one refill was sent to [facility] previously, and this was verified at the end of January.  The patient seems to have a lot of trouble getting this Rx from them every single time a refill is needed.  I called [facility].  I was on hold for several minutes, then the call disconnected.  I called back again. Was on hold for another 20 minutes then was asked to call alternate number of [phone].  I called this number, and was transferred to an automated system, which said it would fax me a form to verify patient med to be dispensed.  I received this form, completed it, and faxed it back to them at [phone] Ref Case # [id number] Rx # [id number].  Confirmation they received the fax back is time stamped 11:48am.

## 2013-05-16 NOTE — Telephone Encounter (Signed)
Patient calling to state that she only has 2 days left of her [name] and she needs a 90 day supply of the brand only and not generic. Patient states that [facility] is trying to give her the generic. Patient requests that we call this number in order to get this fixed: [phone]. Patient is also requesting that she have some samples of Lunesta to pick up. Please call and advise patient.

## 2013-05-18 ENCOUNTER — Telehealth: Payer: Self-pay | Admitting: Neurology

## 2013-05-18 NOTE — Telephone Encounter (Signed)
I called Elizabeth back.  Advised we have already sent this Rx.  (Please see previous 2 notes).  I gave her the Case # [id number] and Rx # [id number] to reference.  She said she will follow up with the patient on Friday, and call us back if anything further is needed.  I called [facility] back, and was transferred to the automated system, which said this order is scheduled to ship tomorrow.

## 2013-05-18 NOTE — Telephone Encounter (Signed)
Elizabeth w/[employer] is calling regarding patient to be sure [facility] gets Rx for patient--patient needs refill for Lunesta #90----Elizabeth requests a call back to verify Rx was sent to [facility] and received-her# [phone]--thank you.

## 2013-05-31 ENCOUNTER — Ambulatory Visit (INDEPENDENT_AMBULATORY_CARE_PROVIDER_SITE_OTHER): Payer: Medicare Other | Admitting: Gynecology

## 2013-05-31 ENCOUNTER — Encounter: Payer: Self-pay | Admitting: Gynecology

## 2013-05-31 VITALS — BP 122/68 | Resp 12 | Ht 66.5 in | Wt 192.0 lb

## 2013-05-31 DIAGNOSIS — B3731 Acute candidiasis of vulva and vagina: Secondary | ICD-10-CM

## 2013-05-31 DIAGNOSIS — B373 Candidiasis of vulva and vagina: Secondary | ICD-10-CM | POA: Diagnosis not present

## 2013-05-31 MED ORDER — FLUCONAZOLE 150 MG PO TABS: ORAL_TABLET | ORAL | Status: DC

## 2013-05-31 NOTE — Patient Instructions (Signed)
cetaphil liquid to vulva, no loofa or wash cloths

## 2013-05-31 NOTE — Progress Notes (Signed)
Subjective:     Patient ID: [name], female   DOB: March 19, 1940, 73 y.o.   MRN: [id number]  HPI Comments: Pt here for follow up of vulvitis.  She has been applying the mycolog ointment more to the affected areas as we had discussed.  She states that she is feeling a lot better.  Pt is concerned that she will be having another root canal next month and that she will end up with more issues with yeast.  Pt reports using the vagifem 3x/week and is very pleased by the lack of mess and ease of use.  She believes that some of her vulvar irriation may be related to the cream coming out of the vagina.    Review of Systems Per HPI    Objective:   Physical Exam  Nursing note and vitals reviewed. Constitutional: She is oriented to person, place, and time. She appears well-developed and well-nourished.  Genitourinary: Vagina normal.    Cervix exhibits no discharge. No vaginal discharge (pink, moist) found.  Neurological: She is alert and oriented to person, place, and time.  Skin: Skin is warm and dry.       Assessment:     Vulvitis-resolved     Plan:     Pt will continue with the vagifem she can use 2-3x/w as desired Prophylactic diflucan rx printed, she will check with local pharmacies as brand is desired, she will take 2after 2-3d of antibiotic and then 3d later. Recommend cetaphil emollient to vulva instead of soap

## 2013-07-27 ENCOUNTER — Encounter: Payer: Self-pay | Admitting: Cardiovascular Disease

## 2013-07-27 ENCOUNTER — Ambulatory Visit (INDEPENDENT_AMBULATORY_CARE_PROVIDER_SITE_OTHER): Payer: Medicare Other | Admitting: Cardiovascular Disease

## 2013-07-27 VITALS — BP 110/82 | Ht 66.5 in | Wt 191.6 lb

## 2013-07-27 DIAGNOSIS — R0989 Other specified symptoms and signs involving the circulatory and respiratory systems: Secondary | ICD-10-CM

## 2013-07-27 DIAGNOSIS — Z8679 Personal history of other diseases of the circulatory system: Secondary | ICD-10-CM

## 2013-07-27 DIAGNOSIS — R0600 Dyspnea, unspecified: Secondary | ICD-10-CM

## 2013-07-27 DIAGNOSIS — R079 Chest pain, unspecified: Secondary | ICD-10-CM

## 2013-07-27 DIAGNOSIS — Z8619 Personal history of other infectious and parasitic diseases: Secondary | ICD-10-CM

## 2013-07-27 DIAGNOSIS — R0609 Other forms of dyspnea: Secondary | ICD-10-CM | POA: Diagnosis not present

## 2013-07-27 DIAGNOSIS — R0602 Shortness of breath: Secondary | ICD-10-CM | POA: Diagnosis not present

## 2013-07-27 DIAGNOSIS — Z9989 Dependence on other enabling machines and devices: Secondary | ICD-10-CM

## 2013-07-27 DIAGNOSIS — G4733 Obstructive sleep apnea (adult) (pediatric): Secondary | ICD-10-CM

## 2013-07-27 NOTE — Patient Instructions (Signed)
Dr Claiborne Billings has requested that you have en exercise stress myoview in 2 weeks. For further information please visit [email or web address]. Please follow instruction sheet, as given.  Dr Claiborne Billings has requested that you have an echocardiogram in 2 weeks. Echocardiography is a painless test that uses sound waves to create images of your heart. It provides your doctor with information about the size and shape of your heart and how well your heart's chambers and valves are working. This procedure takes approximately one hour. There are no restrictions for this procedure.  Your physician recommends that you schedule a follow-up appointment in in 3 week with Dr Claiborne Billings.

## 2013-07-29 ENCOUNTER — Telehealth: Payer: Self-pay | Admitting: Cardiovascular Disease

## 2013-07-29 NOTE — Telephone Encounter (Signed)
lmsg for patient.  She needs to have a stress test and echo in two weeks and a fu appt with tk in 3 weeks.

## 2013-08-02 ENCOUNTER — Telehealth: Payer: Self-pay | Admitting: *Deleted

## 2013-08-02 ENCOUNTER — Other Ambulatory Visit: Payer: Self-pay | Admitting: *Deleted

## 2013-08-02 DIAGNOSIS — R079 Chest pain, unspecified: Secondary | ICD-10-CM

## 2013-08-02 DIAGNOSIS — R0602 Shortness of breath: Secondary | ICD-10-CM

## 2013-08-02 NOTE — Telephone Encounter (Signed)
Received a staff message from [place] requesting order  for patient's myoview to be changed from exercise to [place]. After consulting with Dr. Claiborne Billings he asked for [name] to speak with the patient to see whether she can walk because at her appointment time she felt that she could. Charlena Cross informs me that when she called the patient to schedule the initial test she was told by the patient that she had discussed her previous knee surgery with Dr. Claiborne Billings and thought that he understood that she could not walk. Also her orthopedist request for her not to walk on treadmill. This message was relayed to Dr. Claiborne Billings via text requesting for order change. He responded ok to change order.

## 2013-08-05 ENCOUNTER — Telehealth: Payer: Self-pay | Admitting: Neurology

## 2013-08-05 NOTE — Telephone Encounter (Signed)
Dr. Brett Fairy spoke to the patient directly and instructed her to not take the [name] [facility] and if she falls asleep and wakes up she can try drinking some hot tea, soothing music and if that does not help she can take another Unisom but to make sure that she does not drive for the next 6 hours.

## 2013-08-05 NOTE — Telephone Encounter (Signed)
Patient calling with complaint of not able to sleep.  Slept for 3 hrs only after taking Lunesta Rx.  Having stress test 6/16 ordered by Dr Ellouise Newer.   Please call and advise after 1:45 today

## 2013-08-05 NOTE — Telephone Encounter (Signed)
Patient states that she is in the midst of having her house renovated, garage now the kitchen that will be finished in about one week.  Patient states that about two weeks ago after coming back home from [place] she experienced some chest pain, arm tingling and called her primary who referred her to Dr. Forde Dandy has had a EKG done and is scheduled for a stress test on 06/16 , the problem is that she has not had but 3 hours of sleep in the past 48 hours.  Patient does take [name] and that usually works for her.  Patient states she is not in a panic but knows something is wrong, she is keyed up, feeling tired and worn out.  Please advise her if there is something that she could do or take, or should she just ride it out.  Patient states ok to leave a message on the home number [phone] if no answer on her cell.

## 2013-08-08 ENCOUNTER — Telehealth: Payer: Self-pay | Admitting: Neurology

## 2013-08-08 NOTE — Telephone Encounter (Signed)
Spoke to husband and he relayed that she took 2 Unisom before bed and then one in middle of night.  A friend gave her "Phenidut", which she took at night and then another and became extremely manic, not sleeping.  The husband gave her Risperdal to calm her down and said she is coming down from her manic stage.  I told him it would be best to see doctor about the medications she has been taking, appointment has been made for tomorrow at 1430.

## 2013-08-08 NOTE — Telephone Encounter (Signed)
Spouse calling with complaints of patient being hyper, very agitated, no filter when speaking, sleepiness, maniac.  She inadvertently had reaction with non prescription drug (Phenidut).  Please call and advise.

## 2013-08-09 ENCOUNTER — Encounter: Payer: Self-pay | Admitting: Neurology

## 2013-08-09 ENCOUNTER — Ambulatory Visit (INDEPENDENT_AMBULATORY_CARE_PROVIDER_SITE_OTHER): Payer: Medicare Other | Admitting: Neurology

## 2013-08-09 VITALS — BP 116/79 | HR 88 | Resp 13 | Ht 67.25 in | Wt 196.0 lb

## 2013-08-09 DIAGNOSIS — F5105 Insomnia due to other mental disorder: Secondary | ICD-10-CM | POA: Diagnosis not present

## 2013-08-09 DIAGNOSIS — F3011 Manic episode without psychotic symptoms, mild: Secondary | ICD-10-CM | POA: Diagnosis not present

## 2013-08-09 DIAGNOSIS — F489 Nonpsychotic mental disorder, unspecified: Secondary | ICD-10-CM

## 2013-08-09 DIAGNOSIS — F411 Generalized anxiety disorder: Secondary | ICD-10-CM | POA: Diagnosis not present

## 2013-08-09 MED ORDER — RISPERIDONE 0.5 MG PO TABS: 0.5000 mg | ORAL_TABLET | Freq: Two times a day (BID) | ORAL | Status: DC

## 2013-08-09 MED ORDER — LORAZEPAM 1 MG PO TABS: 1.0000 mg | ORAL_TABLET | ORAL | Status: DC

## 2013-08-09 MED ORDER — FLEET ENEMA 7-19 GM/118ML RE ENEM: 1.0000 | ENEMA | Freq: Every day | RECTAL | Status: DC | PRN

## 2013-08-09 NOTE — Progress Notes (Signed)
[facility]  [name]:  Larey Seat, M D  Referring [name]: Brenda Crutch, MD Primary Care Physician:   Brenda Crutch, MD  Chief Complaint  Patient presents with  . Follow-up    Room 10  . Medication Problem    HPI:  [name] is a 73 y.o. female is usually sen for follow up on her CPAP compliance, but not today.  Mrs. [name] is a established patient of mine she returns to head today here for a different kind of sleep problems. She developed severe insomnia and hypomania in the first 2 weeks of June 2015 this progressed after a friend gave her a medication that apparently is a Mcarthur Rossetti her ejection I witnessed. She became very agitated. Her behavior had changed prior to using this medication, which was intended for her to finally get some sleep . She had for example visited a weight loss program, she has made some purchases of a new I phone, on the internet . She bought 2000 dollars worth of clothes.    She  Was also very much out of her usual kind personality , as she became agitated and aggressive. Rambling on and on.  This was exponentially worse, after the "Phenbut "  Intake of an unknown dose.  It seems that some renovations that were underway in her home  have triggered some outburst , as she felt deprived of comforting and familiar objects. She has visual hallucinations after using her own sleep medication ( Unisom and Trazodone ) and after she added the friend's "Phenbut ".       .  Mrs. [name] has been a long-standing patient of mine. She first was evaluated for a possible sleep apnea in 2007.  At the time a polysomnography was obtained and turned into a split-night titration , OSA was diagnosed , the patient was prescribed 6 cm water CPAP pressure with the REM dependent AHI of 54 and an over all AHI of 8.5. 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 She continues to  take Unisom. She felt that she slept better with CPAP and uses it regularly however and the machine was needed and she have to be re\re qualifying for CPAP this time under Medicare criteria the study took place on 02-09-12 and the patient was re\re titrated to 10 cm water pressure with a 3 cm flex function. She had gained some weight and additional needed some sleep inducing medication GU Nestor and Mariel Aloe and he remained on Lexapro and amitriptyline and also managed to control headaches. She has a past medical history of date headaches with a migrainous component, fibromyalgia, rhinitis, insomnia, depression, hypothyroidism and structure sleep apnea. In January 2014 she was 100% compliant by CMS criteria in a 90 day review of CPAP therapy I have seen her last in January 2014 then be discussed also alternatives of upper airway resistance isn't on therapy. She is followed by her rheumatologist and her primary care physician. 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 Dr. Kris Green, the patient's endocrinologist tested her for vitamin D levels on 03-16-13 also she took already 50,000 units in vitamin D supplements weekly her vitamin D level the current level at 22.9 ng. She is now prescribed 100,000 units a week. The patient was diagnosed as osteopenia saw a normal vitamin D level is essential for Board of Health. It is also associated with lower  levels of migraine and was a lower risk of developing multiple sclerosis.   ROS : Review of Systems: Out of a complete 14 system review, the patient complains of only the following symptoms, and all other reviewed systems are negative. The patient again states that she feels very fatigued that she legs all energy on the geriatric depression score she only endorsed with 3 points.   On the Epworth sleepiness score 3 points but on the fatigue severity score of 41 point. Over the of systems is otherwise positive for fatigue neck pain hearing  loss and neck stiffness light sensitivity, excessive thirst, heat intolerance, weight gain, constipation, insomnia and is aching muscles back pain and joint pain and a tendency to bruise easily.  History   Social History  . Marital Status: Married    Spouse Name: [name]    Number of Children: 0  . Years of Education: 15   Occupational History  . Not on file.   Social History Main Topics  . Smoking status: Passive Smoke Exposure - Never Smoker  . Smokeless tobacco: Never Used  . Alcohol Use: Yes     Comment: 1 glass - occas.  . Drug Use: No  . Sexual Activity: Yes    Partners: Male   Other Topics Concern  . Not on file   Social History Narrative   Patient is married [name]).   Patient drinks 2-4 cups of coffee daily.   Patient is retired.   Patient has a college education.   Patient is right-handed.                Family History  Problem Relation Age of Onset  . Breast cancer Mother   . Breast cancer Maternal Aunt   . Breast cancer Maternal Aunt   . Breast cancer Maternal Aunt   . Breast cancer Maternal Aunt   . Aneurysm Father   . Migraines Father   . High blood pressure Brother   . Heart disease      Grandmother    Past Medical History  Diagnosis Date  . Migraine 10/88    [place]  . 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 Fibromyalgia 8/98    [place]  . 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 Plantar fasciitis   . Osteoarthritis 2007    [place]  . Vitreous degeneration of right eye   . Nuclear sclerosis   . Impaired hearing     left ear  . Scarlet fever as child  . Constipation   . OSA on CPAP 2/07    uses cpap setting of 10  . Pain last week    left under breast pain   . Insomnia     circadian rhythm component  . Depression   . Thyroid disorder     Past Surgical History  Procedure Laterality Date  . Tonsillectomy and adenoidectomy  1952  . De quervain's release Right 10/97    [place]  . Breast biopsy Left 6/00     [place]  . 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 Tubal ligation    . Left breast biopsy    . Tonsillectomy  age 75  . Total knee arthroplasty Right 11/08/2012    Procedure: RIGHT TOTAL KNEE ARTHROPLASTY;  Surgeon: Gearlean Alf, MD;  Location: [facility];  Service: Orthopedics;  Laterality: Right;  . Root canal  02/11/2012    Current Outpatient Prescriptions  Medication Sig Dispense Refill  . aspirin EC 81 MG  tablet Take 81 mg by mouth daily.      . Aspirin-Acetaminophen-Caffeine (EXCEDRIN MIGRAINE PO) Take by mouth. As needed      . celecoxib (CELEBREX) 200 MG capsule Take 200 mg by mouth daily.      . cyclobenzaprine (FLEXERIL) 10 MG tablet Take 10 mg by mouth at bedtime.       Marland Kitchen doxylamine, Sleep, (UNISOM) 25 MG tablet Take 50 mg by mouth at bedtime as needed for sleep.      Marland Kitchen escitalopram (LEXAPRO) 20 MG tablet Take 30 mg by mouth daily. Takes 1 and 1/2 tablet      . fluconazole (DIFLUCAN) 150 MG tablet 1 tablet orally today and repeat in 3 days.  BRAND ONLY.  DO NOT SUBSTITUTE.  2 tablet  4  . MAGNESIUM PO Take 1 tablet by mouth daily. 2-4 times per day- 150 mg      . NUTRITIONAL SUPPLEMENTS PO Take by mouth. Intestinal formula- taking one a day      . thyroid (ARMOUR) 60 MG tablet Take 120-180 mg by mouth daily before breakfast. Takes 2 tablets every day except Sunday and Wednesday she takes 3 tablets Takes in middle of night      . valACYclovir (VALTREX) 1000 MG tablet Take 500 mg by mouth daily.      . Estradiol (VAGIFEM) 10 MCG TABS vaginal tablet Place 1 tablet (10 mcg total) vaginally 2 (two) times a week.  24 tablet  3  . Eszopiclone (ESZOPICLONE) 3 MG TABS Take 3 mg by mouth at bedtime. Take immediately before bedtime      . Multiple Vitamins-Minerals (MULTIVITAMIN PO) Take 1 tablet by mouth daily.      . Probiotic Product (PROBIOTIC DAILY PO) Take by mouth.      . traMADol (ULTRAM) 50 MG tablet Take 1-2 tablets (50-100 mg total) by mouth every 6 (six) hours as needed (mild  pain).  60 tablet  0  . Vitamin D, Ergocalciferol, (DRISDOL) 50000 UNITS CAPS capsule Take 50,000 Units by mouth. Two times a week       No current facility-administered medications for this visit.    Allergies as of 08/09/2013 - Review Complete 08/09/2013  Allergen Reaction Noted  . Penicillins Anaphylaxis 04/29/2011  . Codeine Nausea Only 04/29/2011  . Provigil [modafinil]  03/24/2013  . Xyrem [sodium oxybate]  06/18/2011  . Ciprofloxacin Rash 07/01/2012  . Monistat [miconazole] Rash 07/01/2012  . Terazol [terconazole] Rash 07/01/2012    Vitals: BP 116/79  Pulse 88  Resp 13  Ht 5' 7.25" (1.708 m)  Wt 196 lb (88.905 kg)  BMI 30.48 kg/m2  LMP 02/25/1995 Last Weight:  Wt Readings from Last 1 Encounters:  08/09/13 196 lb (88.905 kg)   Last Height:   Ht Readings from Last 1 Encounters:  08/09/13 5' 7.25" (1.708 m)   Physical exam:  General: The patient is awake, alert and appears not in acute distress. The patient is well groomed. Head: Normocephalic, atraumatic. Neck is supple. Mallampati 2 , neck circumference: 15  Cardiovascular:  Regular rate and rhythm, without  murmurs or carotid bruit, and without distended neck veins. Respiratory: Lungs are clear to auscultation. Skin:  Without evidence of edema, or rash Trunk: BMI is elevated and patient  has normal posture.  Neurologic exam : The patient is covering her eyes , resting on the exam table and rambling, rambling. She is exausted yet agitated.   Speech is pressured without  dysarthria, dysphonia or aphasia.  Mood and  affect are appropriate.  Cranial nerves: Pupils are equal and briskly reactive to light.  Funduscopic exam without  evidence of pallor or edema. Extraocular movements  in vertical and horizontal planes intact and without nystagmus.  Visual fields by finger perimetry are intact.Hearing to finger rub intact.  Facial sensation intact to fine touch. Facial motor strength is symmetric and tongue and uvula  move midline.  Motor exam:   Normal tone and muscle bulk and symmetric strength in all extremities.  Sensory:  Fine touch, pinprick and vibration were tested in all extremities.   Coordination: Rapid alternating movements in the fingers/hands is tested and normal. Finger-to-nose maneuver tested and normal without evidence of ataxia, dysmetria or tremor.  Gait and station: Patient walks without assistive device - Stance is stable and normal. Tandem gait is unfragmented.  Romberg testing is normal.   Plan:  Treatment plan and additional workup :  Acute manic episode - Risperdal to be added to her medication-  Bid po. Ativan 1 mg at night.    and no further weight loss drugs or Amphetamines,

## 2013-08-10 ENCOUNTER — Telehealth: Payer: Self-pay | Admitting: Neurology

## 2013-08-10 NOTE — Telephone Encounter (Signed)
FYI, Pt called to tell Dr. Brett Fairy thank you so much and that pt is back to 85% of herself and that pt respects and appreciates the support and time that Dr. Brett Fairy did for her.

## 2013-08-11 ENCOUNTER — Telehealth (HOSPITAL_COMMUNITY): Payer: Self-pay | Admitting: *Deleted

## 2013-08-11 ENCOUNTER — Encounter: Payer: Self-pay | Admitting: *Deleted

## 2013-08-11 NOTE — Telephone Encounter (Signed)
Pt would like the ok to cancel stress test for now because she is literally feeling stressed out.  Please call and advise

## 2013-08-11 NOTE — Telephone Encounter (Signed)
Attempted to call pt, no answer lmom

## 2013-08-12 ENCOUNTER — Telehealth (HOSPITAL_COMMUNITY): Payer: Self-pay

## 2013-08-12 ENCOUNTER — Telehealth: Payer: Self-pay | Admitting: Neurology

## 2013-08-12 NOTE — Telephone Encounter (Addendum)
Pt called states she called several Psychiatrists that Dr. Brett Fairy requested. Pt states Dr. Robina Ade can't see her until Dec and another one was in Oct the one was available in Sept. Pt wants to see if Dr. Brett Fairy can refer her to one sooner than that. Pt states she needs to see someone soon and if Dr. Roddie Mc or her nurse could call her back concerning this matter. Thanks

## 2013-08-12 NOTE — Telephone Encounter (Signed)
Patient requesting the Nuclear Stress test be rescheduled to a later date.  She feels she is too tired to have this done at this time.  She will keep the appointment for the Echo.  I will have Ebony cancel and call patient next week to reschedule.

## 2013-08-12 NOTE — Telephone Encounter (Signed)
Patient would like for Dr. Brett Fairy to call Dr. Toy Care and speak to her directly about her diagnosis, to get her a sooner apointment.  Please advise.  Patient knows Dr. Brett Fairy is out of the office this afternoon and will address her message on Monday.

## 2013-08-15 ENCOUNTER — Ambulatory Visit (HOSPITAL_COMMUNITY)
Admission: RE | Admit: 2013-08-15 | Discharge: 2013-08-15 | Disposition: A | Discharge status: Home / Self Care | Payer: Medicare Other | Attending: Psychiatry | Admitting: Psychiatry

## 2013-08-15 ENCOUNTER — Telehealth: Payer: Self-pay | Admitting: Neurology

## 2013-08-15 NOTE — Telephone Encounter (Signed)
  I Spoke to Dr. Starleen Arms office , where the patient  cannot be seen early enough- even  full self pay patient- before December.  Dr Caprice Beaver has 2 NPs retire this Summer, no new patients until August when a new  NP starts at her office.  I will try [facility] next .  The patient has received a resperidol prescription in the meantime.  CD

## 2013-08-15 NOTE — Telephone Encounter (Signed)
Patient was given the information for the Daytime clinic at [facility], [address], [phone] and the notes were faxed to them.  I called over and they informed me to have the patient go to the mail entrance and ask for the TTS Department.  Patient was given the above information.

## 2013-08-15 NOTE — Telephone Encounter (Signed)
Patient's husband calling regarding message given to patient earlier today regarding Behavioral Health--please call.

## 2013-08-15 NOTE — Telephone Encounter (Signed)
I spoke to Dr. Caprice Beaver- she recommended using the [facility] outpatient treatment program for a timely assessment and treatment. [phone]. i called and will speak to Mrs Frier as soon as i have an appointment.   I found the daytime clinic at [facility] to be the easiest accessible option. Fax referral to [phone], phone is [phone].

## 2013-08-15 NOTE — BH Assessment (Signed)
Tele Assessment Note   [name] is an 73 y.o. female that presents to [facility] for an assessment. Patient's complaint includes insomnia, anxiety, and stress. Her main complaint is insominia. She worked as a Occupational psychologist for yrs. Says that her yrs of work contributed to her current sleep issues. She has taken prescription medications to assist but continues to have ongoing symptoms. She is concerned about taking recent medications (3 pills)  from friend but stating she didn't read or research the medication prior to taking it. She is easily stressed which ignites her anxiety. She reports to this Probation officer numerous stressors from past to present. Examples of her stressors include the following: recent renovations on home, medical issues (including constipation), and arguments with spouse. Patient reports chest pain within the last week stating, "I thought I was having a heart attack but I believe it was a panic attack.  Patient denies SI, HI, and AVH's. No associated hx. Her judgement is appropriate. Insight is fair. She is oriented to person, place, and situation. No hx of inpatient treatment. She has a upcoming appointment with psychiatrist Dr. Marchelle Gearing 08/17/2013.  Patient discharged home to follow up with upcoming appointment.   Axis I: Anxiety Disorder NOS and Depressive Disorder NOS Axis II: Deferred Axis III:  Past Medical History  Diagnosis Date  . Migraine 10/88    Spillman  . 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 Fibromyalgia 8/98    Truslow  . 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 Plantar fasciitis   . Osteoarthritis 2007    Deveschwar  . Vitreous degeneration of right eye   . Nuclear sclerosis   . Impaired hearing     left ear  . Scarlet fever as child  . Constipation   . OSA on CPAP 2/07    uses cpap setting of 10  . Pain last week    left under breast pain   . Insomnia     circadian rhythm component  . Depression   . Thyroid disorder    Axis  IV: other psychosocial or environmental problems, problems related to social environment and problems with access to health care services Axis V: 51-60 moderate symptoms  Past Medical History:  Past Medical History  Diagnosis Date  . Migraine 10/88    Spillman  . 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 Fibromyalgia 8/98    Truslow  . 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 Plantar fasciitis   . Osteoarthritis 2007    Deveschwar  . Vitreous degeneration of right eye   . Nuclear sclerosis   . Impaired hearing     left ear  . Scarlet fever as child  . Constipation   . OSA on CPAP 2/07    uses cpap setting of 10  . Pain last week    left under breast pain   . Insomnia     circadian rhythm component  . Depression   . Thyroid disorder     Past Surgical History  Procedure Laterality Date  . Tonsillectomy and adenoidectomy  1952  . De quervain's release Right 10/97    Sypher  . Breast biopsy Left 6/00    Hardcastle  . 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 Tubal ligation    . Left breast biopsy    . Tonsillectomy  age 72  . Total knee arthroplasty Right 11/08/2012    Procedure: RIGHT TOTAL KNEE ARTHROPLASTY;  Surgeon: Gearlean Alf,  MD;  Location: [facility];  Service: Orthopedics;  Laterality: Right;  . Root canal  02/11/2012    Family History:  Family History  Problem Relation Age of Onset  . Breast cancer Mother   . Breast cancer Maternal Aunt   . Breast cancer Maternal Aunt   . Breast cancer Maternal Aunt   . Breast cancer Maternal Aunt   . Aneurysm Father   . Migraines Father   . High blood pressure Brother   . Heart disease      Grandmother    Social History:  reports that she has been passively smoking.  She has never used smokeless tobacco. She reports that she drinks alcohol. She reports that she does not use illicit drugs.  Additional Social History:  Alcohol / Drug Use Pain Medications: SEE MAR Prescriptions: SEE MAR Over the Counter: SEE  MAR History of alcohol / drug use?: No history of alcohol / drug abuse  CIWA:   COWS:    Allergies:  Allergies  Allergen Reactions  . Penicillins Anaphylaxis    Tongue swelling  . Codeine Nausea Only  . Provigil [Modafinil]   . Xyrem [Sodium Oxybate]     hallucination  . Ciprofloxacin Rash  . Monistat [Miconazole] Rash  . Terazol [Terconazole] Rash    Home Medications:  (Not in a hospital admission)  OB/GYN Status:  Patient's last menstrual period was 02/25/1995.  General Assessment Data Location of Assessment: [facility] Is this a Tele or Face-to-Face Assessment?: Face-to-Face Is this an Initial Assessment or a Re-assessment for this encounter?: Initial Assessment Living Arrangements: Spouse/significant other Can pt return to current living arrangement?: Yes Admission Status: Voluntary Is patient capable of signing voluntary admission?: Yes Transfer from: [facility] Referral Source: Self/Family/Friend     [place] Living Arrangements: Spouse/significant other Name of Psychiatrist:  (No psychiatrist ) Name of Therapist:  (No therapist )  Education Status Is patient currently in school?: No  Risk to self Suicidal Ideation: No Suicidal Intent: No Is patient at risk for suicide?: No Suicidal Plan?: No Access to Means: No What has been your use of drugs/alcohol within the last 12 months?:  (n/a) Previous Attempts/Gestures: No How many times?:  (0) Other Self Harm Risks:  (n/a) Triggers for Past Attempts: Other (Comment) (none reported ) Intentional Self Injurious Behavior: None Family Suicide History: Unknown Persecutory voices/beliefs?: No Depression: Yes Depression Symptoms: Feeling angry/irritable;Loss of interest in usual pleasures;Fatigue Substance abuse history and/or treatment for substance abuse?: No Suicide prevention information given to non-admitted patients: Not applicable  Risk to Others Homicidal Ideation:  No Thoughts of Harm to Others: No Current Homicidal Intent: No Current Homicidal Plan: No Access to Homicidal Means: No Identified Victim:  (n/a) History of harm to others?: No Assessment of Violence: None Noted Violent Behavior Description:  (patient is calm and cooperative ) Does patient have access to weapons?: No Criminal Charges Pending?: No Does patient have a court date: No  Psychosis Hallucinations: None noted Delusions: None noted  Mental Status Report Appear/Hygiene: Unremarkable Eye Contact: Good Motor Activity: Freedom of movement Speech: Logical/coherent Level of Consciousness: Alert Mood: Depressed Affect: Appropriate to circumstance Anxiety Level: None Thought Processes: Coherent;Relevant Judgement: Unimpaired Orientation: Person;Place;Time;Situation;Appropriate for developmental age Obsessive Compulsive Thoughts/Behaviors: None  Cognitive Functioning Concentration: Decreased Memory: Recent Intact;Remote Intact IQ: Average Insight: Fair Impulse Control: Fair Appetite: Fair Weight Loss:  (none reported ) Weight Gain:  (none reported) Sleep: Decreased Total Hours of Sleep:  (varies; ok within the last  few days, hx of bad sleep) Vegetative Symptoms: None  ADLScreening [facility]) Patient's cognitive ability adequate to safely complete daily activities?: Yes Patient able to express need for assistance with ADLs?: Yes Independently performs ADLs?: Yes (appropriate for developmental age)  Prior Inpatient Therapy Prior Inpatient Therapy: No Prior Therapy Dates:  (n/a) Prior Therapy Facilty/Provider(s):  (n/a) Reason for Treatment:  (n/a)  Prior Outpatient Therapy Prior Outpatient Therapy: No Prior Therapy Dates:  (n/a) Prior Therapy Facilty/Provider(s):  (n/a) Reason for Treatment:  (n/a)  ADL Screening (condition at time of admission) Patient's cognitive ability adequate to safely complete daily activities?: Yes Is the patient deaf  or have difficulty hearing?: Yes Does the patient have difficulty seeing, even when wearing glasses/contacts?: No Does the patient have difficulty concentrating, remembering, or making decisions?: No Patient able to express need for assistance with ADLs?: Yes Does the patient have difficulty dressing or bathing?: No Independently performs ADLs?: Yes (appropriate for developmental age) Does the patient have difficulty walking or climbing stairs?: No Weakness of Legs: None Weakness of Arms/Hands: None  Home Assistive Devices/Equipment Home Assistive Devices/Equipment: None    Abuse/Neglect Assessment (Assessment to be complete while patient is alone) Physical Abuse: Denies Verbal Abuse: Denies Sexual Abuse: Denies Exploitation of patient/patient's resources: Denies Self-Neglect: Denies Values / Beliefs Cultural Requests During Hospitalization: None Spiritual Requests During Hospitalization: None   Advance Directives (For Healthcare) Advance Directive: Patient does not have advance directive Nutrition Screen- [place] [facility] Patient's home diet: Regular  Additional Information 1:1 In Past 12 Months?: No CIRT Risk: No Elopement Risk: No Does patient have medical clearance?: Yes     Disposition:  Disposition Initial Assessment Completed for this Encounter: Yes Disposition of Patient: Outpatient treatment;Referred to (Dr. Zachery Dauer 08/17/2013 ) Type of outpatient treatment: Adult  Evangeline Gula 08/15/2013 6:18 PM

## 2013-08-15 NOTE — Telephone Encounter (Signed)
[facility] w[facility] called states pt came in for Assessment, advised pt did not know why she was there and she states they see pt's for mental health such as suicidal etc. She states that we need to call pt to clarify a little more information on why she was referred to their office. She said the fax # for Outpt is [phone] and for TTS is [phone]. If you have any questions for Marlou Porch you can contact her at [phone]. Thanks

## 2013-08-15 NOTE — BH Assessment (Signed)
Patient presented to [facility] as a walk in directed by her physician-Dr. Dohmeir. Patient and spouse had some confusion as to why they were sent here to [facility]. Patient is not in any immediate crises (no SI, HI, AVH's).  Writer reviewed the notes in EPIC written by her physician Dr. Maureen Chatters. Based on the notes patient may have presented to the wrong place and may have needed outpatient services vs. TTS services. Notes indicate that patient is in need of outpatient services. However, another note indicates that patient should ask for TTS to assess. This information was not clear to patient, patient's spouse, and/or this Probation officer. Attempted to call Dr. Maureen Chatters, however; she had left the office for the day. Writer left a message with the receptionist to obtain clarity and appropriate future direction for patient.

## 2013-08-16 ENCOUNTER — Encounter (HOSPITAL_COMMUNITY): Payer: Medicare Other

## 2013-08-16 NOTE — Telephone Encounter (Signed)
Patient's husband was notified that the appointment for yesterday was for her to be assessed at [facility] for them to decide if she could go to the Day program.  Dr. 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  Patient's husband will call the office and schedule an appointment once she has seen Dr. Casimiro Needle.

## 2013-08-16 NOTE — Telephone Encounter (Signed)
Pt called back would like for Dr. Edwena Felty nurse to call her back concerning her visit with [facility]. She states she will be taking a stress during the hours from 1-4 but you can speak with pt's husband if you want to. Thanks

## 2013-08-16 NOTE — Telephone Encounter (Signed)
How did they end up at [facility] in the mental ill department, what role does the psychiatrist have in this, she has an appointment on tomorrow with Dr. Casimiro Needle and it has been a week since she was told not to drive, who will determine if she can drive.   Please advise patient's husband   [phone].

## 2013-08-17 ENCOUNTER — Telehealth: Payer: Self-pay | Admitting: Neurology

## 2013-08-17 ENCOUNTER — Telehealth (HOSPITAL_COMMUNITY): Payer: Self-pay | Admitting: *Deleted

## 2013-08-17 DIAGNOSIS — F4322 Adjustment disorder with anxiety: Secondary | ICD-10-CM | POA: Diagnosis not present

## 2013-08-17 NOTE — Telephone Encounter (Signed)
Pt would like to speak with Dr. Claiborne Billings regarding wether she should hold off on rescheduling the stress test or if she should just have it done. She is now seeing a psychiatrist and he has her on some new medications.

## 2013-08-17 NOTE — Telephone Encounter (Signed)
Patient calling to state that she feels like she is 99% back to her old self, and is going to see her psychiatrist today and is hoping to receive good news about being able to drive again. If questions, please return call to patient and advise.

## 2013-08-17 NOTE — Telephone Encounter (Signed)
Pt is calling wanted to let Dr. Brett Fairy know that she feels 99% like her old self and that she is going to see her psychiatrist and hope she is able to drive again. Pt stated that if Dr. Brett Fairy has any questions, please give pt a call back. FYI

## 2013-08-19 ENCOUNTER — Telehealth: Payer: Self-pay | Admitting: *Deleted

## 2013-08-19 NOTE — Telephone Encounter (Signed)
Dr. Kelly please address 

## 2013-08-19 NOTE — Telephone Encounter (Signed)
Pt calling wanting Dr. Brett Fairy to know that she is thankful for your care and that Dr. Casimiro Needle took her off Risperdal and gave her something for Anxiety and that she feels better than she has in a long time and that she loves Dr. Casimiro Needle. FYI

## 2013-08-20 ENCOUNTER — Encounter: Payer: Self-pay | Admitting: Cardiovascular Disease

## 2013-08-20 DIAGNOSIS — R0600 Dyspnea, unspecified: Secondary | ICD-10-CM | POA: Insufficient documentation

## 2013-08-20 DIAGNOSIS — Z8679 Personal history of other diseases of the circulatory system: Secondary | ICD-10-CM | POA: Insufficient documentation

## 2013-08-20 DIAGNOSIS — R079 Chest pain, unspecified: Secondary | ICD-10-CM | POA: Insufficient documentation

## 2013-08-20 DIAGNOSIS — R0609 Other forms of dyspnea: Secondary | ICD-10-CM | POA: Insufficient documentation

## 2013-08-20 NOTE — Progress Notes (Addendum)
Patient ID: [name], female   DOB: 1940-03-03, 73 y.o.   MRN: [id number]     PATIENT PROFILE: [name] is a 73 y.o. female who is referred for the courtesy of Dr.  Forde Dandy for evaluation of exertional shortness of breath, and recent left arm/chest discomfort.    HPI:  [name] is a 73 y.o. female has a remote history of rheumatic fever with scarlet fever as a child.  She also has a history of sleep apnea on CPAP therapy, followed by Dr. Kathaleen Grinder, a history of fibromyalgias, as well as osteoarthritis.  Last week while on an airplane, the person next to her husband suffered a heart attack.  Later that day, the patient experienced left arm and chest discomfort.  She also has noticed greasing exertional shortness of breath.  She has been able to do water aerobics without difficulty.  Because of her change in symptomatology.  She now is referred for cardiology evaluation.  She admits to a 60 pound weight gain over 15 years.  She denies presyncope or syncope.  She denies PND or orthopnea.  She is unaware of palpitations.  Past Medical History  Diagnosis Date  . Migraine 10/88    Spillman  . 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 Fibromyalgia 8/98    Truslow  . 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 Plantar fasciitis   . Osteoarthritis 2007    Deveschwar  . Vitreous degeneration of right eye   . Nuclear sclerosis   . Impaired hearing     left ear  . Scarlet fever as child  . Constipation   . OSA on CPAP 2/07    uses cpap setting of 10  . Pain last week    left under breast pain   . Insomnia     circadian rhythm component  . Depression   . Thyroid disorder     Past Surgical History  Procedure Laterality Date  . Tonsillectomy and adenoidectomy  1952  . De quervain's release Right 10/97    Sypher  . Breast biopsy Left 6/00    Hardcastle  . 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 Tubal ligation    . Left breast biopsy    . Tonsillectomy  age 59  . Total knee  arthroplasty Right 11/08/2012    Procedure: RIGHT TOTAL KNEE ARTHROPLASTY;  Surgeon: Gearlean Alf, MD;  Location: [facility];  Service: Orthopedics;  Laterality: Right;  . Root canal  02/11/2012    Allergies  Allergen Reactions  . Penicillins Anaphylaxis    Tongue swelling  . Codeine Nausea Only  . Provigil [Modafinil]   . Xyrem [Sodium Oxybate]     hallucination  . Ciprofloxacin Rash  . Monistat [Miconazole] Rash  . Terazol [Terconazole] Rash    Current Outpatient Prescriptions  Medication Sig Dispense Refill  . aspirin EC 81 MG tablet Take 81 mg by mouth daily.      . Aspirin-Acetaminophen-Caffeine (EXCEDRIN MIGRAINE PO) Take by mouth. As needed      . celecoxib (CELEBREX) 200 MG capsule Take 200 mg by mouth daily.      . cyclobenzaprine (FLEXERIL) 10 MG tablet Take 10 mg by mouth at bedtime.       Marland Kitchen doxylamine, Sleep, (UNISOM) 25 MG tablet Take 50 mg by mouth at bedtime as needed for sleep.      Marland Kitchen escitalopram (LEXAPRO) 20 MG tablet Take 30 mg by mouth daily. Takes 1 and 1/2  tablet      . Estradiol (VAGIFEM) 10 MCG TABS vaginal tablet Place 1 tablet (10 mcg total) vaginally 2 (two) times a week.  24 tablet  3  . Eszopiclone (ESZOPICLONE) 3 MG TABS Take 3 mg by mouth at bedtime. Take immediately before bedtime      . fluconazole (DIFLUCAN) 150 MG tablet 1 tablet orally today and repeat in 3 days.  BRAND ONLY.  DO NOT SUBSTITUTE.  2 tablet  4  . Multiple Vitamins-Minerals (MULTIVITAMIN PO) Take 1 tablet by mouth daily.      . Probiotic Product (PROBIOTIC DAILY PO) Take by mouth.      . thyroid (ARMOUR) 60 MG tablet Take 120-180 mg by mouth daily before breakfast. Takes 2 tablets every day except Sunday and Wednesday she takes 3 tablets Takes in middle of night      . valACYclovir (VALTREX) 1000 MG tablet Take 500 mg by mouth daily.      . Vitamin D, Ergocalciferol, (DRISDOL) 50000 UNITS CAPS capsule Take 50,000 Units by mouth. Two times a week      . LORazepam (ATIVAN) 1 MG tablet  Take 1 tablet (1 mg total) by mouth as directed.  30 tablet  0  . MAGNESIUM PO Take 1 tablet by mouth daily. 2-4 times per day- 150 mg      . NUTRITIONAL SUPPLEMENTS PO Take by mouth. Intestinal formula- taking one a day      . risperiDONE (RISPERDAL) 0.5 MG tablet Take 1 tablet (0.5 mg total) by mouth 2 (two) times daily.  60 tablet  1   Current Facility-Administered Medications  Medication Dose Route Frequency Provider Last Rate Last Dose  . sodium phosphate (FLEET) 7-19 GM/118ML enema 1 enema  1 enema Rectal Daily PRN Larey Seat, MD        Socially, she was born in [place].  He is married for 23 years.  She has 4 grandchildren.  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  She does drink occasional vodka and red wine.  There is no tobacco use.  She does water aerobics.  Family History  Problem Relation Age of Onset  . Breast cancer Mother   . Breast cancer Maternal Aunt   . Breast cancer Maternal Aunt   . Breast cancer Maternal Aunt   . Breast cancer Maternal Aunt   . Aneurysm Father   . Migraines Father   . High blood pressure Brother   . Heart disease      Grandmother    ROS General: Negative; No fevers, chills, or night sweats; positive for 60 pound weight gain over 15 years HEENT: Positive for dental implants No changes in vision or hearing, sinus congestion, difficulty swallowing Pulmonary: Negative; No cough, wheezing, shortness of breath, hemoptysis Cardiovascular:  See HPI; No  presyncope, syncope, palpitations, edema GI: Negative; No nausea, vomiting, diarrhea, or abdominal pain GU: Negative; No dysuria, hematuria, or difficulty voiding Musculoskeletal: Positive for fibromyalgia and osteoarthritis; she is status post  knee replacement; no myalgias, joint pain, or weakness Hematologic/Oncologic: Negative; no easy bruising,  bleeding Endocrine: Negative; no heat/cold intolerance; no diabetes Neuro: Negative; no changes in balance, headaches Skin: Negative; No rashes or skin lesions Psychiatric: Negative; No behavioral problems, depression Sleep: Positive for sleep apnea on CPAP No daytime sleepiness, hypersomnolence, bruxism, restless legs, hypnogagnic hallucinations Other comprehensive 14 point  system review is negative   Physical Exam BP 110/82  Ht 5' 6.5" (1.689 m)  Wt 191 lb 9.6 oz (86.909 kg)  BMI 30.47 kg/m2  LMP 02/25/1995 General: Alert, oriented, no distress.  Skin: normal turgor, no rashes, warm and dry HEENT: Normocephalic, atraumatic. Pupils equal round and reactive to light; sclera anicteric; extraocular muscles intact; Fundi without hemorrhages or exudates. Nose without nasal septal hypertrophy Mouth/Parynx benign; Mallinpatti scale 3/4 Neck: No JVD, no carotid bruits; normal carotid upstroke Lungs: clear to ausculatation and percussion; no wheezing or rales Chest wall: without tenderness to palpitation Heart: PMI not displaced, RRR, s1 s2 normal, 1/6 systolic murmur, no diastolic murmur, no rubs, gallops, thrills, or heaves Abdomen: soft, nontender; no hepatosplenomehaly, BS+; abdominal aorta nontender and not dilated by palpation. Back: no CVA tenderness Pulses 2+ Musculoskeletal: full range of motion, normal strength, no joint deformities Extremities: no clubbing cyanosis or edema, Homan's sign negative  Neurologic: grossly nonfocal; Cranial nerves grossly wnl Psychologic: Normal mood and affect   ECG (independently read by me): Normal sinus rhythm at 77 beats per minute.  No ectopy.  QTc interval 49 ms.  LABS:  BMET    Component Value Date/Time   NA 137 11/10/2012 0615   K 3.8 11/10/2012 0615   CL 99 11/10/2012 0615   CO2 29 11/10/2012 0615   GLUCOSE 112* 11/10/2012 0615   BUN 9 11/10/2012 0615   CREATININE 0.47* 11/10/2012 0615   CALCIUM 8.8 11/10/2012 0615   GFRNONAA >90  11/10/2012 0615   GFRAA >90 11/10/2012 0615     Hepatic Function Panel     Component Value Date/Time   PROT 6.9 11/02/2012 1445   ALBUMIN 3.7 11/02/2012 1445   AST 18 11/02/2012 1445   ALT 17 11/02/2012 1445   ALKPHOS 78 11/02/2012 1445   BILITOT 0.2* 11/02/2012 1445     CBC    Component Value Date/Time   WBC 11.5* 11/10/2012 0615   RBC 3.87 11/10/2012 0615   HGB 11.5* 11/10/2012 0615   HCT 34.2* 11/10/2012 0615   PLT 198 11/10/2012 0615   MCV 88.4 11/10/2012 0615   MCH 29.7 11/10/2012 0615   MCHC 33.6 11/10/2012 0615   RDW 13.1 11/10/2012 0615     BNP No results found for this basename: probnp    Lipid Panel  No results found for this basename: chol, trig, hdl, cholhdl, vldl, ldlcalc      RADIOLOGY: No results found.   ASSESSMENT AND PLAN: Ms. 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  Remotely, she suffered rheumatic fever/scarlet fever as a child.  She recently developed an episode of chest pain after a period of stress when she witnessed the person next to her husband on an airplane having a myocardial infarction.  She did experience chest pain, and left arm discomfort later that day and also has noticed a change in exercise tolerance with exertional shortness of breath.  I did review blood work, which was done earlier this year, which showed a glucose of 107, BUN 16, creatinine 0.6.  Her cholesterol was 188, triglycerides 97, HDL 57, LDL cholesterol 110.  She had normal liver function studies.  At that time, her TSH was over suppressed at 0.02.  Free T4 was normal at 0.9.  She sees Dr. Forde Dandy for endocrinologic care.  Presently, I am scheduling her for a 2-D echo Doppler study.  I'm also  scheduling her for an exercise Myoview scan in light of her recent change in symptomatology.  I'll see her back in the office in followup of the above studies and further  recommendations will be made at that time.   Troy Sine, MD, [facility] 08/20/2013 2:13 PM

## 2013-08-22 NOTE — Telephone Encounter (Signed)
Is she still having and shortness of breath or any recurrence of arm discomfort. If she is, then proceed as scheduled. If not, she can defer until later date with recent additional psyciatric eval.

## 2013-08-22 NOTE — Telephone Encounter (Signed)
Left message for patient with Dr. Evette Georges recommendation's. Call if questions

## 2013-08-23 ENCOUNTER — Ambulatory Visit (HOSPITAL_COMMUNITY)
Admission: RE | Admit: 2013-08-23 | Discharge: 2013-08-23 | Disposition: A | Discharge status: Home / Self Care | Payer: Medicare Other | Source: Ambulatory Visit | Attending: Cardiovascular Disease | Admitting: Cardiovascular Disease

## 2013-08-23 DIAGNOSIS — R079 Chest pain, unspecified: Secondary | ICD-10-CM | POA: Insufficient documentation

## 2013-08-23 DIAGNOSIS — R0602 Shortness of breath: Secondary | ICD-10-CM

## 2013-08-23 DIAGNOSIS — R0609 Other forms of dyspnea: Secondary | ICD-10-CM

## 2013-08-23 DIAGNOSIS — I517 Cardiomegaly: Secondary | ICD-10-CM

## 2013-08-23 DIAGNOSIS — R0600 Dyspnea, unspecified: Secondary | ICD-10-CM

## 2013-08-23 NOTE — Progress Notes (Signed)
2D Echocardiogram Complete.  08/23/2013   Chenay Nesmith, RDCS

## 2013-08-24 ENCOUNTER — Telehealth: Payer: Self-pay | Admitting: Cardiovascular Disease

## 2013-08-24 NOTE — Telephone Encounter (Signed)
Pt was to have fu visit with tk after echo and nuc.  Pt has deferred nuc until later with ok from tk.  Will not schedule fu with tk at this point.

## 2013-08-25 NOTE — Telephone Encounter (Signed)
Encounter complete. 

## 2013-09-02 ENCOUNTER — Telehealth: Payer: Self-pay | Admitting: Neurology

## 2013-09-02 NOTE — Telephone Encounter (Signed)
FYI-Patient states she is already seeing a psychiatrist and likes him very much and does not want to be referred to anyone else.

## 2013-09-05 NOTE — Telephone Encounter (Signed)
FYI

## 2013-09-05 NOTE — Telephone Encounter (Signed)
She told me in person that she is seeing Dr Casimiro Needle and will stay with him. Glad, she found a good match.

## 2013-09-06 ENCOUNTER — Ambulatory Visit (INDEPENDENT_AMBULATORY_CARE_PROVIDER_SITE_OTHER): Payer: Medicare Other | Admitting: Adult Health

## 2013-09-06 ENCOUNTER — Encounter: Payer: Self-pay | Admitting: Adult Health

## 2013-09-06 VITALS — BP 138/87 | HR 68 | Ht 67.0 in | Wt 198.0 lb

## 2013-09-06 DIAGNOSIS — F5105 Insomnia due to other mental disorder: Secondary | ICD-10-CM | POA: Diagnosis not present

## 2013-09-06 DIAGNOSIS — F411 Generalized anxiety disorder: Secondary | ICD-10-CM

## 2013-09-06 DIAGNOSIS — F3011 Manic episode without psychotic symptoms, mild: Secondary | ICD-10-CM | POA: Diagnosis not present

## 2013-09-06 DIAGNOSIS — F489 Nonpsychotic mental disorder, unspecified: Secondary | ICD-10-CM | POA: Diagnosis not present

## 2013-09-06 NOTE — Patient Instructions (Signed)
Generalized Anxiety Disorder  Generalized anxiety disorder (GAD) is a mental disorder. It interferes with life functions, including relationships, work, and school.  GAD is different from normal anxiety, which everyone experiences at some point in their lives in response to specific life events and activities. Normal anxiety actually helps us prepare for and get through these life events and activities. Normal anxiety goes away after the event or activity is over.   GAD causes anxiety that is not necessarily related to specific events or activities. It also causes excess anxiety in proportion to specific events or activities. The anxiety associated with GAD is also difficult to control. GAD can vary from mild to severe. People with severe GAD can have intense waves of anxiety with physical symptoms (panic attacks).   SYMPTOMS  The anxiety and worry associated with GAD are difficult to control. This anxiety and worry are related to many life events and activities and also occur more days than not for 6 months or longer. People with GAD also have three or more of the following symptoms (one or more in children):  · Restlessness.    · Fatigue.  · Difficulty concentrating.    · Irritability.  · Muscle tension.  · Difficulty sleeping or unsatisfying sleep.  DIAGNOSIS  GAD is diagnosed through an assessment by your caregiver. Your caregiver will ask you questions about your mood, physical symptoms, and events in your life. Your caregiver may ask you about your medical history and use of alcohol or drugs, including prescription medications. Your caregiver may also do a physical exam and blood tests. Certain medical conditions and the use of certain substances can cause symptoms similar to those associated with GAD. Your caregiver may refer you to a mental health specialist for further evaluation.  TREATMENT  The following therapies are usually used to treat GAD:   · Medication--Antidepressant medication usually is  prescribed for long-term daily control. Antianxiety medications may be added in severe cases, especially when panic attacks occur.    · Talk therapy (psychotherapy)--Certain types of talk therapy can be helpful in treating GAD by providing support, education, and guidance. A form of talk therapy called cognitive behavioral therapy can teach you healthy ways to think about and react to daily life events and activities.  · Stress management techniques--These include yoga, meditation, and exercise and can be very helpful when they are practiced regularly.  A mental health specialist can help determine which treatment is best for you. Some people see improvement with one therapy. However, other people require a combination of therapies.  Document Released: 06/07/2012 Document Reviewed: 06/07/2012  ExitCare® Patient Information ©2015 [employer]. This information is not intended to replace advice given to you by your health care provider. Make sure you discuss any questions you have with your health care provider.

## 2013-09-06 NOTE — Progress Notes (Signed)
[name]: [name] DOB: Jul 23, 1940  REASON FOR VISIT: follow up HISTORY FROM: [name]  HISTORY OF PRESENT ILLNESS: [name] is a 73 year old female with a history of generalized anxiety disorder, hypomania and insomnia. She returns today for follow-up.  [name] was given medication called "PURE" for psychosis and that sent her into a manic episode. She was purchasing 2000 dollars worth of clothing. She was very agitated and aggressive. She was also having some hallucinations. She has stopped that medicine and was started on Risperdal. She is now a [name] of Dr. Casimiro Needle and he took her off Risperdal and put her clonazepam in the morning and lorazepam at night.  She states that has been working well with the exception that she is tired. [name] is no longer agitated and aggressive nor having hallucination. No new medical issues since last seen. [name] states that she has an appointment with Dr. Casimiro Needle next week.   REVIEW OF SYSTEMS: Full 14 system review of systems performed and notable only for:  Constitutional: Fatigue Eyes: Light sensitivity Ear/Nose/Throat: N/A  Skin: N/A  Cardiovascular: N/A  Respiratory: N/A  Gastrointestinal: Constipation  Genitourinary: N/A Hematology/Lymphatic: N/A  Endocrine: He in tolerance, excessive thirst, excessive eating Musculoskeletal: Joint pain, joint swelling, aching muscles, muscle cramps, neck pain, neck stiffness Allergy/Immunology: N/A  Neurological: Headache Psychiatric: N/A Sleep: N/A   ALLERGIES: Allergies  Allergen Reactions  . Penicillins Anaphylaxis    Tongue swelling  . Codeine Nausea Only  . Provigil [Modafinil]   . Xyrem [Sodium Oxybate]     hallucination  . Ciprofloxacin Rash  . Monistat [Miconazole] Rash  . Terazol [Terconazole] Rash    HOME MEDICATIONS: Outpatient Prescriptions Prior to Visit  Medication Sig Dispense Refill  . aspirin EC 81 MG tablet Take 81 mg by mouth daily.      .  Aspirin-Acetaminophen-Caffeine (EXCEDRIN MIGRAINE PO) Take by mouth. As needed      . celecoxib (CELEBREX) 200 MG capsule Take 200 mg by mouth daily.      Marland Kitchen escitalopram (LEXAPRO) 20 MG tablet Take 30 mg by mouth daily. Takes 1 and 1/2 tablet      . fluconazole (DIFLUCAN) 150 MG tablet 1 tablet orally today and repeat in 3 days.  BRAND ONLY.  DO NOT SUBSTITUTE.  2 tablet  4  . MAGNESIUM PO Take 1 tablet by mouth daily. 2-4 times per day- 150 mg      . Probiotic Product (PROBIOTIC DAILY PO) Take by mouth.      . thyroid (ARMOUR) 60 MG tablet Take 120-180 mg by mouth daily before breakfast. Takes 2 tablets every day except Sunday and Wednesday she takes 3 tablets Takes in middle of night      . valACYclovir (VALTREX) 1000 MG tablet Take 500 mg by mouth daily.      . Vitamin D, Ergocalciferol, (DRISDOL) 50000 UNITS CAPS capsule Take 50,000 Units by mouth. Two times a week      . LORazepam (ATIVAN) 1 MG tablet Take 1 tablet (1 mg total) by mouth as directed.  30 tablet  0  . cyclobenzaprine (FLEXERIL) 10 MG tablet Take 10 mg by mouth at bedtime.       Marland Kitchen doxylamine, Sleep, (UNISOM) 25 MG tablet Take 50 mg by mouth at bedtime as needed for sleep.      . Estradiol (VAGIFEM) 10 MCG TABS vaginal tablet Place 1 tablet (10 mcg total) vaginally 2 (two) times a week.  24 tablet  3  .  Eszopiclone (ESZOPICLONE) 3 MG TABS Take 3 mg by mouth at bedtime. Take immediately before bedtime      . Multiple Vitamins-Minerals (MULTIVITAMIN PO) Take 1 tablet by mouth daily.      Marland Kitchen NUTRITIONAL SUPPLEMENTS PO Take by mouth. Intestinal formula- taking one a day      . risperiDONE (RISPERDAL) 0.5 MG tablet Take 1 tablet (0.5 mg total) by mouth 2 (two) times daily.  60 tablet  1   Facility-Administered Medications Prior to Visit  Medication Dose Route Frequency Provider Last Rate Last Dose  . sodium phosphate (FLEET) 7-19 GM/118ML enema 1 enema  1 enema Rectal Daily PRN Larey Seat, MD        PAST MEDICAL  HISTORY: Past Medical History  Diagnosis Date  . Migraine 10/88    [place]  . 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 Fibromyalgia 8/98    [place]  . 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 Plantar fasciitis   . Osteoarthritis 2007    [place]  . Vitreous degeneration of right eye   . Nuclear sclerosis   . Impaired hearing     left ear  . Scarlet fever as child  . Constipation   . OSA on CPAP 2/07    uses cpap setting of 10  . Pain last week    left under breast pain   . Insomnia     circadian rhythm component  . Depression   . Thyroid disorder     PAST SURGICAL HISTORY: Past Surgical History  Procedure Laterality Date  . Tonsillectomy and adenoidectomy  1952  . De quervain's release Right 10/97    [place]  . Breast biopsy Left 6/00    [place]  . 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 Tubal ligation    . Left breast biopsy    . Tonsillectomy  age 42  . Total knee arthroplasty Right 11/08/2012    Procedure: RIGHT TOTAL KNEE ARTHROPLASTY;  Surgeon: Gearlean Alf, MD;  Location: [facility];  Service: Orthopedics;  Laterality: Right;  . Root canal  02/11/2012    FAMILY HISTORY: Family History  Problem Relation Age of Onset  . Breast cancer Mother   . Breast cancer Maternal Aunt   . Breast cancer Maternal Aunt   . Breast cancer Maternal Aunt   . Breast cancer Maternal Aunt   . Aneurysm Father   . Migraines Father   . High blood pressure Brother   . Heart disease      Grandmother    SOCIAL HISTORY: History   Social History  . Marital Status: Married    Spouse Name: [name]    Number of Children: 0  . Years of Education: 15   Occupational History  . Not on file.   Social History Main Topics  . Smoking status: Passive Smoke Exposure - Never Smoker  . Smokeless tobacco: Never Used  . Alcohol Use: Yes     Comment: 1 glass - occas.  . Drug Use: No  . Sexual Activity: Yes    Partners: Male   Other Topics Concern  . Not on file   Social  History Narrative   [name] is married [name]).   [name] drinks 2-4 cups of coffee daily.   [name] is retired.   [name] has a college education.   [name] is right-handed.                  PHYSICAL EXAM  Filed Vitals:  09/06/13 1440  BP: 138/87  Pulse: 68  Height: 5\' 7"  (1.702 m)  Weight: 198 lb (89.812 kg)   Body mass index is 31 kg/(m^2).  Generalized: Well developed, in no acute distress   Neurological examination  Mentation: Alert oriented to time, place, history taking. Follows all commands speech and language fluent Cranial nerve II-XII:  Extraocular movements were full, visual field were full on confrontational test.  Motor: The motor testing reveals 5 over 5 strength of all 4 extremities. Good symmetric motor tone is noted throughout.  Sensory: Sensory testing is intact to soft touch on all 4 extremities. No evidence of extinction is noted.  Coordination: Cerebellar testing reveals good finger-nose-finger and heel-to-shin bilaterally.  Gait and station: Gait is normal. Tandem gait is normal. Romberg is negative. No drift is seen.  Reflexes: Deep tendon reflexes are symmetric and normal bilaterally.   DIAGNOSTIC DATA (LABS, IMAGING, TESTING) - I reviewed [name] records, labs, notes, testing and imaging myself where available.  Lab Results  Component Value Date   WBC 11.5* 11/10/2012   HGB 11.5* 11/10/2012   HCT 34.2* 11/10/2012   MCV 88.4 11/10/2012   PLT 198 11/10/2012      Component Value Date/Time   NA 137 11/10/2012 0615   K 3.8 11/10/2012 0615   CL 99 11/10/2012 0615   CO2 29 11/10/2012 0615   GLUCOSE 112* 11/10/2012 0615   BUN 9 11/10/2012 0615   CREATININE 0.47* 11/10/2012 0615   CALCIUM 8.8 11/10/2012 0615   PROT 6.9 11/02/2012 1445   ALBUMIN 3.7 11/02/2012 1445   AST 18 11/02/2012 1445   ALT 17 11/02/2012 1445   ALKPHOS 78 11/02/2012 1445   BILITOT 0.2* 11/02/2012 1445   GFRNONAA >90 11/10/2012 0615   GFRAA >90 11/10/2012 0615       ASSESSMENT AND  PLAN 73 y.o. 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 here with:  1. generalized anxiety disorder 2. Hypomania single episode 3. Insomnia  [name] has overall improved since the last visit. She has not had any additional manic episodes. She is no longer agitated, aggressive or having hallucinations. [name] is being seen by Dr. Casimiro Needle. He started her on clonazepam in the morning  and lorazepam in the evening. She feels that she is doing well with this. The [name] will follow-up in August with Dr. Brett Fairy.    Ward Givens, MSN, NP-C 09/06/2013, 2:50 PM [facility] [address], [place], [place] [address] [phone]  Note: This document was prepared with digital dictation and possible smart phrase technology. Any transcriptional errors that result from this process are unintentional.

## 2013-09-06 NOTE — Progress Notes (Signed)
I agree with the assessment and plan as directed by NP .The patient is known to me .   Mccall Will, MD  

## 2013-09-15 DIAGNOSIS — F4322 Adjustment disorder with anxiety: Secondary | ICD-10-CM | POA: Diagnosis not present

## 2013-09-20 DIAGNOSIS — M1710 Unilateral primary osteoarthritis, unspecified knee: Secondary | ICD-10-CM | POA: Diagnosis not present

## 2013-09-20 DIAGNOSIS — IMO0001 Reserved for inherently not codable concepts without codable children: Secondary | ICD-10-CM | POA: Diagnosis not present

## 2013-09-20 DIAGNOSIS — M5030 Other cervical disc degeneration, unspecified cervical region: Secondary | ICD-10-CM | POA: Diagnosis not present

## 2013-09-20 DIAGNOSIS — M19049 Primary osteoarthritis, unspecified hand: Secondary | ICD-10-CM | POA: Diagnosis not present

## 2013-09-21 DIAGNOSIS — M949 Disorder of cartilage, unspecified: Secondary | ICD-10-CM | POA: Diagnosis not present

## 2013-09-21 DIAGNOSIS — Z6830 Body mass index (BMI) 30.0-30.9, adult: Secondary | ICD-10-CM | POA: Diagnosis not present

## 2013-09-21 DIAGNOSIS — E038 Other specified hypothyroidism: Secondary | ICD-10-CM | POA: Diagnosis not present

## 2013-09-21 DIAGNOSIS — G4730 Sleep apnea, unspecified: Secondary | ICD-10-CM | POA: Diagnosis not present

## 2013-09-21 DIAGNOSIS — G43909 Migraine, unspecified, not intractable, without status migrainosus: Secondary | ICD-10-CM | POA: Diagnosis not present

## 2013-09-21 DIAGNOSIS — E785 Hyperlipidemia, unspecified: Secondary | ICD-10-CM | POA: Diagnosis not present

## 2013-09-21 DIAGNOSIS — M899 Disorder of bone, unspecified: Secondary | ICD-10-CM | POA: Diagnosis not present

## 2013-09-21 DIAGNOSIS — L659 Nonscarring hair loss, unspecified: Secondary | ICD-10-CM | POA: Diagnosis not present

## 2013-09-21 DIAGNOSIS — E559 Vitamin D deficiency, unspecified: Secondary | ICD-10-CM | POA: Diagnosis not present

## 2013-10-04 ENCOUNTER — Encounter: Payer: Self-pay | Admitting: Neurology

## 2013-10-04 ENCOUNTER — Ambulatory Visit (INDEPENDENT_AMBULATORY_CARE_PROVIDER_SITE_OTHER): Payer: Medicare Other | Admitting: Neurology

## 2013-10-04 VITALS — BP 100/64 | HR 72 | Resp 16 | Ht 67.0 in | Wt 165.0 lb

## 2013-10-04 DIAGNOSIS — F418 Other specified anxiety disorders: Secondary | ICD-10-CM

## 2013-10-04 DIAGNOSIS — F489 Nonpsychotic mental disorder, unspecified: Secondary | ICD-10-CM | POA: Diagnosis not present

## 2013-10-04 DIAGNOSIS — F411 Generalized anxiety disorder: Secondary | ICD-10-CM | POA: Diagnosis not present

## 2013-10-04 DIAGNOSIS — F331 Major depressive disorder, recurrent, moderate: Secondary | ICD-10-CM | POA: Diagnosis not present

## 2013-10-04 DIAGNOSIS — G478 Other sleep disorders: Secondary | ICD-10-CM | POA: Insufficient documentation

## 2013-10-04 DIAGNOSIS — F341 Dysthymic disorder: Secondary | ICD-10-CM | POA: Diagnosis not present

## 2013-10-04 DIAGNOSIS — F5105 Insomnia due to other mental disorder: Secondary | ICD-10-CM

## 2013-10-04 NOTE — Patient Instructions (Signed)
Insomnia Insomnia is frequent trouble falling and/or staying asleep. Insomnia can be a long term problem or a short term problem. Both are common. Insomnia can be a short term problem when the wakefulness is related to a certain stress or worry. Long term insomnia is often related to ongoing stress during waking hours and/or poor sleeping habits. Overtime, sleep deprivation itself can make the problem worse. Every little thing feels more severe because you are overtired and your ability to cope is decreased. CAUSES   Stress, anxiety, and depression.  Poor sleeping habits.  Distractions such as TV in the bedroom.  Naps close to bedtime.  Engaging in emotionally charged conversations before bed.  Technical reading before sleep.  Alcohol and other sedatives. They may make the problem worse. They can hurt normal sleep patterns and normal dream activity.  Stimulants such as caffeine for several hours prior to bedtime.  Pain syndromes and shortness of breath can cause insomnia.  Exercise late at night.  Changing time zones may cause sleeping problems (jet lag). It is sometimes helpful to have someone observe your sleeping patterns. They should look for periods of not breathing during the night (sleep apnea). They should also look to see how long those periods last. If you live alone or observers are uncertain, you can also be observed at a sleep clinic where your sleep patterns will be professionally monitored. Sleep apnea requires a checkup and treatment. Give your caregivers your medical history. Give your caregivers observations your family has made about your sleep.  SYMPTOMS   Not feeling rested in the morning.  Anxiety and restlessness at bedtime.  Difficulty falling and staying asleep. TREATMENT   Your caregiver may prescribe treatment for an underlying medical disorders. Your caregiver can give advice or help if you are using alcohol or other drugs for self-medication. Treatment  of underlying problems will usually eliminate insomnia problems.  Medications can be prescribed for short time use. They are generally not recommended for lengthy use.  Over-the-counter sleep medicines are not recommended for lengthy use. They can be habit forming.  You can promote easier sleeping by making lifestyle changes such as:  Using relaxation techniques that help with breathing and reduce muscle tension.  Exercising earlier in the day.  Changing your diet and the time of your last meal. No night time snacks.  Establish a regular time to go to bed.  Counseling can help with stressful problems and worry.  Soothing music and white noise may be helpful if there are background noises you cannot remove.  Stop tedious detailed work at least one hour before bedtime. HOME CARE INSTRUCTIONS   Keep a diary. Inform your caregiver about your progress. This includes any medication side effects. See your caregiver regularly. Take note of:  Times when you are asleep.  Times when you are awake during the night.  The quality of your sleep.  How you feel the next day. This information will help your caregiver care for you.  Get out of bed if you are still awake after 15 minutes. Read or do some quiet activity. Keep the lights down. Wait until you feel sleepy and go back to bed.  Keep regular sleeping and waking hours. Avoid naps.  Exercise regularly.  Avoid distractions at bedtime. Distractions include watching television or engaging in any intense or detailed activity like attempting to balance the household checkbook.  Develop a bedtime ritual. Keep a familiar routine of bathing, brushing your teeth, climbing into bed at the same   time each night, listening to soothing music. Routines increase the success of falling to sleep faster.  Use relaxation techniques. This can be using breathing and muscle tension release routines. It can also include visualizing peaceful scenes. You can  also help control troubling or intruding thoughts by keeping your mind occupied with boring or repetitive thoughts like the old concept of counting sheep. You can make it more creative like imagining planting one beautiful flower after another in your backyard garden.  During your day, work to eliminate stress. When this is not possible use some of the previous suggestions to help reduce the anxiety that accompanies stressful situations. MAKE SURE YOU:   Understand these instructions.  Will watch your condition.  Will get help right away if you are not doing well or get worse. Document Released: 02/08/2000 Document Revised: 05/05/2011 Document Reviewed: 03/10/2007 ExitCare Patient Information 2015 [employer]. This information is not intended to replace advice given to you by your health care provider. Make sure you discuss any questions you have with your health care provider.  

## 2013-10-04 NOTE — Progress Notes (Signed)
PATIENT: [name] DOB: [birth year]  REASON FOR VISIT: follow up for hypomania and insomnia, UARS.  HISTORY FROM: patient  HISTORY OF PRESENT ILLNESS: [name] is a 73 year old female with a history of generalized anxiety disorder, hypomania and insomnia.  She returns today for follow-up. She has felt better since her medication changed from Risperdal to Klonopin, 0.25 mg ( half of the initial dose) and lorazepam at night 2 mg.  She has more energy, she feels balanced and not pressured, agitated or impulsive. She is relaxed. She happily reports some weight loss.  Her insomnia is still a problem, but she goes to sleep first, just doesn't sleep through. She takes 3 mg melatonin , she looks for a sleep aid that allows her to get additional 3 hours of sleep after her middle of the night arousals.  Her CPAP download was reviewed today , 84% compliance,  6.40 hours average use, residual AHI was 1.6 over a period of 90 days. Download dated in office 10-03-13 .     Last note by MM.  Patient was given medication called "PURE" for psychosis and that sent her into a manic episode. She was purchasing 2000 dollars worth of clothing. She was very agitated and aggressive.  She was also having some hallucinations. She has stopped that medicine and was started on Risperdal.     She is now a patient of Dr. Casimiro Green and he took her off Risperdal and put her clonazepam in the morning and lorazepam at night.  She states that has been working well,  with the exception that she felt tired. Patient is no longer agitated and aggressive nor having hallucination. No new medical issues since last seen. Patient states that she had 2 appointments with Dr. Casimiro Green last on the 23 rd of July ,and sees him again September 11 th. Marland Kitchen   REVIEW OF SYSTEMS: Full 14 system review of systems performed and notable only for:  Epworth 3 , GDS 4 ,  FSS 45.   Constitutional: Fatigue, improved since last visit.  Eyes: Light  sensitivity Ear/Nose/Throat: N/A  Skin: N/A  Cardiovascular: N/A  Respiratory: N/A  Gastrointestinal: Constipation  Genitourinary: N/A Hematology/Lymphatic: N/A  Endocrine: He in tolerance, excessive thirst, excessive eating Musculoskeletal: Joint pain, joint swelling, aching muscles, muscle cramps, neck pain, neck stiffness Allergy/Immunology: N/A  Neurological: Headache Psychiatric: N/A Sleep: N/A   ALLERGIES: Allergies  Allergen Reactions  . Penicillins Anaphylaxis    Tongue swelling  . Codeine Nausea Only  . Provigil [Modafinil]   . Xyrem [Sodium Oxybate]     hallucination  . Ciprofloxacin Rash  . Monistat [Miconazole] Rash  . Terazol [Terconazole] Rash    HOME MEDICATIONS: Outpatient Prescriptions Prior to Visit  Medication Sig Dispense Refill  . aspirin EC 81 MG tablet Take 81 mg by mouth daily.      . Aspirin-Acetaminophen-Caffeine (EXCEDRIN MIGRAINE PO) Take by mouth. As needed      . celecoxib (CELEBREX) 200 MG capsule Take 200 mg by mouth daily.      . clonazePAM (KLONOPIN) 0.5 MG tablet Take 0.5 mg by mouth daily.      Marland Kitchen escitalopram (LEXAPRO) 20 MG tablet Take 30 mg by mouth daily. Takes 1 and 1/2 tablet      . fluconazole (DIFLUCAN) 150 MG tablet 1 tablet orally today and repeat in 3 days.  BRAND ONLY.  DO NOT SUBSTITUTE.  2 tablet  4  . LORazepam (ATIVAN) 1 MG tablet Take 1  mg by mouth 2 (two) times daily.      Marland Kitchen MAGNESIUM PO Take 1 tablet by mouth daily. 2-4 times per day- 150 mg      . Probiotic Product (PROBIOTIC DAILY PO) Take by mouth.      . thyroid (ARMOUR) 60 MG tablet Take 120-180 mg by mouth daily before breakfast. Takes 2 tablets every day except Sunday and Wednesday she takes 3 tablets Takes in middle of night      . valACYclovir (VALTREX) 1000 MG tablet Take 500 mg by mouth daily.      . Vitamin D, Ergocalciferol, (DRISDOL) 50000 UNITS CAPS capsule Take 50,000 Units by mouth. Two times a week      . LORazepam (ATIVAN) 1 MG tablet Take 1 tablet  (1 mg total) by mouth as directed.  30 tablet  0   Facility-Administered Medications Prior to Visit  Medication Dose Route Frequency Provider Last Rate Last Dose  . sodium phosphate (FLEET) 7-19 GM/118ML enema 1 enema  1 enema Rectal Daily PRN Larey Seat, MD        PAST MEDICAL HISTORY: Past Medical History  Diagnosis Date  . Migraine 10/88    [place]  . 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 Fibromyalgia 8/98    [place]  . 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 Plantar fasciitis   . Osteoarthritis 2007    [place]  . Vitreous degeneration of right eye   . Nuclear sclerosis   . Impaired hearing     left ear  . Scarlet fever as child  . Constipation   . OSA on CPAP 2/07    uses cpap setting of 10  . Pain last week    left under breast pain   . Insomnia     circadian rhythm component  . Depression   . Thyroid disorder     PAST SURGICAL HISTORY: Past Surgical History  Procedure Laterality Date  . Tonsillectomy and adenoidectomy  1952  . De quervain's release Right 10/97    [place]  . Breast biopsy Left 6/00    [place]  . 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 Tubal ligation    . Left breast biopsy    . Tonsillectomy  age 46  . Total knee arthroplasty Right 11/08/2012    Procedure: RIGHT TOTAL KNEE ARTHROPLASTY;  Surgeon: Gearlean Alf, MD;  Location: [facility];  Service: Orthopedics;  Laterality: Right;  . Root canal  02/11/2012    FAMILY HISTORY: Family History  Problem Relation Age of Onset  . Breast cancer Mother   . Breast cancer Maternal Aunt   . Breast cancer Maternal Aunt   . Breast cancer Maternal Aunt   . Breast cancer Maternal Aunt   . Aneurysm Father   . Migraines Father   . High blood pressure Brother   . Heart disease      Grandmother    SOCIAL HISTORY: History   Social History  . Marital Status: Married    Spouse Name: [name]    Number of Children: 0  . Years of Education: 14   Occupational History  . Not on file.     Social History Main Topics  . Smoking status: Passive Smoke Exposure - Never Smoker  . Smokeless tobacco: Never Used  . Alcohol Use: Yes     Comment: 1 glass - occas.  . Drug Use: No  . Sexual Activity: Yes    Partners: Male   Other Topics  Concern  . Not on file   Social History Narrative   Patient is married [name]).   Patient drinks 2-4 cups of coffee daily.   Patient is retired.   Patient has two years of college.   Patient is right-handed.                     PHYSICAL EXAM  Filed Vitals:   10/04/13 1316  BP: 100/64  Pulse: 72  Resp: 16  Height: 5\' 7"  (1.702 m)  Weight: 165 lb (74.844 kg)   Body mass index is 25.84 kg/(m^2).  Generalized: Well developed, in no acute distress   Neck circumference 14.5 inches, Mallompatti 2-3, lost weight 185 pounds today , from 202 pounds.    Neurological examination  Mentation: Alert oriented to time, place, history taking. Follows all commands speech and language fluent Cranial nerve II-XII:  Extraocular movements were full, visual field were full on confrontational test.  Motor: The motor testing reveals 5 over 5 strength of all 4 extremities. Good symmetric motor tone is noted throughout.  Sensory: Sensory testing is intact to soft touch on all 4 extremities. No evidence of extinction is noted.  Coordination: Cerebellar testing reveals good finger-nose-finger and heel-to-shin bilaterally.  Gait and station: Gait is normal. Tandem gait is normal. Romberg is negative. No drift is seen.  Reflexes: Deep tendon reflexes are symmetric and normal bilaterally.   DIAGNOSTIC DATA (LABS, IMAGING, TESTING) - I reviewed patient records, labs, notes, testing and imaging myself where available.  Lab Results  Component Value Date   WBC 11.5* 11/10/2012   HGB 11.5* 11/10/2012   HCT 34.2* 11/10/2012   MCV 88.4 11/10/2012   PLT 198 11/10/2012      Component Value Date/Time   NA 137 11/10/2012 0615   K 3.8 11/10/2012 0615   CL 99  11/10/2012 0615   CO2 29 11/10/2012 0615   GLUCOSE 112* 11/10/2012 0615   BUN 9 11/10/2012 0615   CREATININE 0.47* 11/10/2012 0615   CALCIUM 8.8 11/10/2012 0615   PROT 6.9 11/02/2012 1445   ALBUMIN 3.7 11/02/2012 1445   AST 18 11/02/2012 1445   ALT 17 11/02/2012 1445   ALKPHOS 78 11/02/2012 1445   BILITOT 0.2* 11/02/2012 1445   GFRNONAA >90 11/10/2012 0615   GFRAA >90 11/10/2012 0615       ASSESSMENT AND PLAN 74 y.o. 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 here with:  1. Mild apnea with UARS. on CPAP since 2007- Well treated on only 10 cm water .  2. Hypomania single episode, treated and controlled. She took a russian sleep aid in desperation, when she slipped into mania.  3. Insomnia, cyclic resolved, chronic - present. First advise to use Ativan 1 tab at bedtime and another tablet in middle of the night.   I will allow her a trial of Sonata 5 mg.   Patient has overall improved since the last visit.  She is no longer agitated, aggressive or having hallucinations. Patient is being seen by Dr. Casimiro Green.  He started her on clonazepam in the morning  and lorazepam in the evening. She feels that she is doing well with this. The patient will follow-up in  6 month with  me,  Dr. Brett Fairy.    Larey Seat , MD    10/04/2013, 1:46 PM [facility] [address] [place], [place] [address] [phone]  Note: This document was prepared with digital dictation and possible smart phrase technology. Any transcriptional errors that result from this process are unintentional.

## 2013-10-06 DIAGNOSIS — Z471 Aftercare following joint replacement surgery: Secondary | ICD-10-CM | POA: Diagnosis not present

## 2013-10-12 ENCOUNTER — Ambulatory Visit (INDEPENDENT_AMBULATORY_CARE_PROVIDER_SITE_OTHER): Payer: Medicare Other | Admitting: Gynecology

## 2013-10-12 ENCOUNTER — Encounter: Payer: Self-pay | Admitting: Gynecology

## 2013-10-12 ENCOUNTER — Telehealth: Payer: Self-pay | Admitting: Gynecology

## 2013-10-12 VITALS — BP 110/76 | Temp 98.2°F | Ht 67.0 in | Wt 177.0 lb

## 2013-10-12 DIAGNOSIS — N952 Postmenopausal atrophic vaginitis: Secondary | ICD-10-CM

## 2013-10-12 DIAGNOSIS — IMO0002 Reserved for concepts with insufficient information to code with codable children: Secondary | ICD-10-CM

## 2013-10-12 DIAGNOSIS — R590 Localized enlarged lymph nodes: Secondary | ICD-10-CM

## 2013-10-12 DIAGNOSIS — N898 Other specified noninflammatory disorders of vagina: Secondary | ICD-10-CM

## 2013-10-12 DIAGNOSIS — Z0000 Encounter for general adult medical examination without abnormal findings: Secondary | ICD-10-CM | POA: Diagnosis not present

## 2013-10-12 DIAGNOSIS — N760 Acute vaginitis: Secondary | ICD-10-CM

## 2013-10-12 DIAGNOSIS — R599 Enlarged lymph nodes, unspecified: Secondary | ICD-10-CM

## 2013-10-12 LAB — POCT URINALYSIS DIPSTICK
Urobilinogen, UA: NEGATIVE
pH, UA: 5

## 2013-10-12 LAB — POCT WET PREP (WET MOUNT): Clue Cells Wet Prep Whiff POC: NEGATIVE

## 2013-10-12 MED ORDER — HYDROCORTISONE ACETATE 25 MG RE SUPP: RECTAL | Status: DC

## 2013-10-12 NOTE — Patient Instructions (Signed)
Place 3x cotton ball at vaginal opening after placing suppository Take a diflucan day 7 rto in 2w

## 2013-10-12 NOTE — Telephone Encounter (Signed)
Spoke with [name], Pharmacist at [facility] to clarify order for anusol suppositories.  Advised per Dr. Charlies Constable, should be used vaginally for 10 days.  Patient notified rx ready at [facility].[name]

## 2013-10-12 NOTE — Progress Notes (Signed)
Subjective:     Patient ID: [name], female   DOB: 1940-09-13, 73 y.o.   MRN: [id number]  HPI Comments: Pt here with vaginal discharge and irritation of vulva for 2w.  Pt took diflucan 150mg  and repeated in 3d with some relief but not complete relief.  Pt has a history of recurrent vaginitis and was controlled on boric acid but stopped due to non-compliance.  Pt is using triamcinolone ointment externally twice a day that is expired.  Pt is unable to have sex due to pain. Discharge is milky FBS 107 last month. In addition, pt reports left axillary fullness, does not do regular breast exams, last mammogram 12/14 BiRADS 1    Review of Systems  Genitourinary: Positive for vaginal discharge, vaginal pain and dyspareunia. Negative for frequency, hematuria, vaginal bleeding, difficulty urinating and genital sores.  Hematological: Positive for adenopathy (left axillary). Does not bruise/bleed easily.       Objective:   Physical Exam  Nursing note and vitals reviewed. Constitutional: She appears well-developed and well-nourished.  Pulmonary/Chest: Right breast exhibits no inverted nipple, no mass, no nipple discharge, no skin change and no tenderness. Left breast exhibits no inverted nipple, no mass, no nipple discharge, no skin change and no tenderness.  Genitourinary: Uterus normal. There is no rash, tenderness, lesion or injury on the right labia. There is no rash, tenderness, lesion or injury on the left labia. Cervix exhibits no motion tenderness and no discharge. Right adnexum displays no mass and no tenderness. Left adnexum displays no mass and no tenderness. There is tenderness around the vagina. No erythema or bleeding around the vagina. No signs of injury around the vagina. Vaginal discharge (scant white) found.  Lymphadenopathy:    She has no axillary adenopathy.       Right axillary: No pectoral and no lateral adenopathy present.       Left axillary: No pectoral and no lateral  adenopathy present.      Right: No inguinal and no supraclavicular adenopathy present.       Left: No inguinal and no supraclavicular adenopathy present.  Fullness not appreciated on supine or upright exam  Skin: Skin is warm and dry. No rash noted. No erythema.   WP done-many WBC, no yeast or clue    Assessment:     Vaginitis Normal breast/axillary exam     Plan:     DIV-will treat with vaginal steroids, due to frequency of yeast infections, pt will take diflucan (has) on d7 of 10d course rto 2w Once cleared will restart boric acid  Can stop expired triamcinolone

## 2013-10-18 DIAGNOSIS — M949 Disorder of cartilage, unspecified: Secondary | ICD-10-CM | POA: Diagnosis not present

## 2013-10-18 DIAGNOSIS — M899 Disorder of bone, unspecified: Secondary | ICD-10-CM | POA: Diagnosis not present

## 2013-10-18 DIAGNOSIS — E785 Hyperlipidemia, unspecified: Secondary | ICD-10-CM | POA: Diagnosis not present

## 2013-10-18 DIAGNOSIS — Z23 Encounter for immunization: Secondary | ICD-10-CM | POA: Diagnosis not present

## 2013-10-18 DIAGNOSIS — Z79899 Other long term (current) drug therapy: Secondary | ICD-10-CM | POA: Diagnosis not present

## 2013-10-26 DIAGNOSIS — F411 Generalized anxiety disorder: Secondary | ICD-10-CM | POA: Diagnosis not present

## 2013-10-26 DIAGNOSIS — F331 Major depressive disorder, recurrent, moderate: Secondary | ICD-10-CM | POA: Diagnosis not present

## 2013-10-28 ENCOUNTER — Ambulatory Visit (INDEPENDENT_AMBULATORY_CARE_PROVIDER_SITE_OTHER): Payer: Medicare Other | Admitting: Gynecology

## 2013-10-28 VITALS — BP 100/60 | HR 70 | Resp 12 | Ht 67.0 in | Wt 170.0 lb

## 2013-10-28 DIAGNOSIS — Z0000 Encounter for general adult medical examination without abnormal findings: Secondary | ICD-10-CM | POA: Diagnosis not present

## 2013-10-28 DIAGNOSIS — N760 Acute vaginitis: Secondary | ICD-10-CM | POA: Diagnosis not present

## 2013-10-28 DIAGNOSIS — IMO0002 Reserved for concepts with insufficient information to code with codable children: Secondary | ICD-10-CM

## 2013-10-28 LAB — POCT URINALYSIS DIPSTICK
Urobilinogen, UA: NEGATIVE
pH, UA: 5

## 2013-10-28 LAB — POCT WET PREP (WET MOUNT): Clue Cells Wet Prep Whiff POC: NEGATIVE

## 2013-10-28 NOTE — Progress Notes (Signed)
Subjective:     Patient ID: [name], female   DOB: 1940-11-25, 73 y.o.   MRN: [id number]  HPI Comments: Pt here for test of cure.  Treated for DIV with vaginal steroids, pt states that she feels great, no vaginal discharge, irritation or burning.  Pt is still swimming, she is trying to change out of wet undergarments as soon as possible.    Review of Systems Per HPI    Objective:   Physical Exam  Vitals reviewed. Constitutional: She is oriented to person, place, and time. She appears well-developed and well-nourished.  Genitourinary: There is no rash or tenderness on the right labia. There is no rash or tenderness on the left labia. No erythema, tenderness or bleeding around the vagina. No vaginal discharge found.  Neurological: She is alert and oriented to person, place, and time.  Skin: Skin is warm.   WP: neg     Assessment:     DIV Atrophic vaginitis     Plan:     Doing well, vagina non-tender, wp only remarkable for pH 5.5, c/w menopause. Discussed restarting boric acid or watching for symptoms as some may be related to DIV, pt ok to watch and wait. F/u prn

## 2013-11-02 DIAGNOSIS — H04129 Dry eye syndrome of unspecified lacrimal gland: Secondary | ICD-10-CM | POA: Diagnosis not present

## 2013-11-02 DIAGNOSIS — H25019 Cortical age-related cataract, unspecified eye: Secondary | ICD-10-CM | POA: Diagnosis not present

## 2013-11-02 DIAGNOSIS — L820 Inflamed seborrheic keratosis: Secondary | ICD-10-CM | POA: Diagnosis not present

## 2013-11-02 DIAGNOSIS — Z0100 Encounter for examination of eyes and vision without abnormal findings: Secondary | ICD-10-CM | POA: Diagnosis not present

## 2013-11-02 DIAGNOSIS — L739 Follicular disorder, unspecified: Secondary | ICD-10-CM | POA: Diagnosis not present

## 2013-11-04 DIAGNOSIS — F331 Major depressive disorder, recurrent, moderate: Secondary | ICD-10-CM | POA: Diagnosis not present

## 2013-11-29 ENCOUNTER — Telehealth: Payer: Self-pay | Admitting: Gynecology

## 2013-11-29 NOTE — Telephone Encounter (Signed)
Spoke with patient. Patient states that she has had vaginal itching, burning and drainage for 23 days while on vacation. Patient has taken 2 diflucan with no relief. Would like to come in to see Dr.Lathrop on Friday. Declines to see another provider. Appointment scheduled for 8:45am with Dr.Lathrop (time okay per provider). Agreeable to date and time.  Routing to provider for final review. Patient agreeable to disposition. Will close encounter

## 2013-11-29 NOTE — Telephone Encounter (Signed)
[name] calling requesting to be worked in with Dr. Charlies Constable for 12/02/13 for problems with "recurrent vaginal infection." She declined appointments with other providers on other days. [name] has been in [place] for the last three weeks with the problem but will be back by Friday.

## 2013-12-02 ENCOUNTER — Ambulatory Visit (INDEPENDENT_AMBULATORY_CARE_PROVIDER_SITE_OTHER): Payer: Medicare Other | Admitting: Gynecology

## 2013-12-02 VITALS — BP 112/54 | Temp 98.0°F | Resp 16 | Ht 67.0 in | Wt 175.0 lb

## 2013-12-02 DIAGNOSIS — N941 Dyspareunia: Secondary | ICD-10-CM

## 2013-12-02 DIAGNOSIS — N952 Postmenopausal atrophic vaginitis: Secondary | ICD-10-CM

## 2013-12-02 DIAGNOSIS — IMO0002 Reserved for concepts with insufficient information to code with codable children: Secondary | ICD-10-CM

## 2013-12-02 NOTE — Progress Notes (Signed)
Subjective:     Patient ID: [name], female   DOB: 1940/10/22, 73 y.o.   MRN: [id number]  HPI Comments: Pt states that she could not have vaginal penetration, extreme pain, was on vacation.  Pt had 2 diflucan, but did not help.  Pt did well after vaginal steroids for 2m.  Pt is still swimming, just got back from vacation last night.  Did not try otc. No recent antibiotics.  No urinary symptoms-dysuria, frequency or hesitancy.  Pt is using cocoanut oil.    Review of Systems     Objective:   Physical Exam  Nursing note and vitals reviewed. Constitutional: She is oriented to person, place, and time. She appears well-developed and well-nourished.  Genitourinary:   Pelvic: External genitalia:  no lesions              Urethra:  normal appearing urethra with no masses, tenderness or lesions              Bartholins and Skenes: normal                 Vagina: atrophic, normal color and discharge, no lesions              Cervix: normal appearance                     Bimanual Exam:  Uterus:  uterus is normal size, shape, consistency and nontender                                       Adnexa: normal adnexa in size, nontender and no masses                                       Neurological: She is alert and oriented to person, place, and time.  Skin: Skin is warm.       Assessment:     Atrophic vaginitis dyspareunia     Plan:     No discharge, no wbc  Pt relieved Not interested in vaginal estrogen-had been offered in the past

## 2013-12-05 DIAGNOSIS — M9901 Segmental and somatic dysfunction of cervical region: Secondary | ICD-10-CM | POA: Diagnosis not present

## 2013-12-05 DIAGNOSIS — G441 Vascular headache, not elsewhere classified: Secondary | ICD-10-CM | POA: Diagnosis not present

## 2013-12-05 DIAGNOSIS — M546 Pain in thoracic spine: Secondary | ICD-10-CM | POA: Diagnosis not present

## 2013-12-05 DIAGNOSIS — M9902 Segmental and somatic dysfunction of thoracic region: Secondary | ICD-10-CM | POA: Diagnosis not present

## 2013-12-06 DIAGNOSIS — M9901 Segmental and somatic dysfunction of cervical region: Secondary | ICD-10-CM | POA: Diagnosis not present

## 2013-12-06 DIAGNOSIS — G441 Vascular headache, not elsewhere classified: Secondary | ICD-10-CM | POA: Diagnosis not present

## 2013-12-06 DIAGNOSIS — M546 Pain in thoracic spine: Secondary | ICD-10-CM | POA: Diagnosis not present

## 2013-12-06 DIAGNOSIS — M9902 Segmental and somatic dysfunction of thoracic region: Secondary | ICD-10-CM | POA: Diagnosis not present

## 2013-12-07 DIAGNOSIS — M9902 Segmental and somatic dysfunction of thoracic region: Secondary | ICD-10-CM | POA: Diagnosis not present

## 2013-12-07 DIAGNOSIS — M9901 Segmental and somatic dysfunction of cervical region: Secondary | ICD-10-CM | POA: Diagnosis not present

## 2013-12-07 DIAGNOSIS — G441 Vascular headache, not elsewhere classified: Secondary | ICD-10-CM | POA: Diagnosis not present

## 2013-12-07 DIAGNOSIS — M546 Pain in thoracic spine: Secondary | ICD-10-CM | POA: Diagnosis not present

## 2013-12-08 DIAGNOSIS — G441 Vascular headache, not elsewhere classified: Secondary | ICD-10-CM | POA: Diagnosis not present

## 2013-12-08 DIAGNOSIS — M9901 Segmental and somatic dysfunction of cervical region: Secondary | ICD-10-CM | POA: Diagnosis not present

## 2013-12-08 DIAGNOSIS — M9902 Segmental and somatic dysfunction of thoracic region: Secondary | ICD-10-CM | POA: Diagnosis not present

## 2013-12-08 DIAGNOSIS — M546 Pain in thoracic spine: Secondary | ICD-10-CM | POA: Diagnosis not present

## 2013-12-12 DIAGNOSIS — M9901 Segmental and somatic dysfunction of cervical region: Secondary | ICD-10-CM | POA: Diagnosis not present

## 2013-12-12 DIAGNOSIS — M9902 Segmental and somatic dysfunction of thoracic region: Secondary | ICD-10-CM | POA: Diagnosis not present

## 2013-12-12 DIAGNOSIS — M9900 Segmental and somatic dysfunction of head region: Secondary | ICD-10-CM | POA: Diagnosis not present

## 2013-12-12 DIAGNOSIS — F331 Major depressive disorder, recurrent, moderate: Secondary | ICD-10-CM | POA: Diagnosis not present

## 2013-12-12 DIAGNOSIS — G441 Vascular headache, not elsewhere classified: Secondary | ICD-10-CM | POA: Diagnosis not present

## 2013-12-12 DIAGNOSIS — M546 Pain in thoracic spine: Secondary | ICD-10-CM | POA: Diagnosis not present

## 2013-12-14 ENCOUNTER — Telehealth: Payer: Self-pay | Admitting: Neurology

## 2013-12-14 DIAGNOSIS — Z23 Encounter for immunization: Secondary | ICD-10-CM | POA: Diagnosis not present

## 2013-12-14 DIAGNOSIS — F331 Major depressive disorder, recurrent, moderate: Secondary | ICD-10-CM | POA: Diagnosis not present

## 2013-12-14 DIAGNOSIS — M179 Osteoarthritis of knee, unspecified: Secondary | ICD-10-CM | POA: Diagnosis not present

## 2013-12-14 DIAGNOSIS — F329 Major depressive disorder, single episode, unspecified: Secondary | ICD-10-CM | POA: Diagnosis not present

## 2013-12-14 DIAGNOSIS — G479 Sleep disorder, unspecified: Secondary | ICD-10-CM | POA: Diagnosis not present

## 2013-12-14 NOTE — Telephone Encounter (Addendum)
Pt calls to complain that she returned from a trip to [place] a couple of weeks ago and has not been able to sleep since this time - October 8th.  She says that she called Dr. Karen Chafe office and scheduled to meet with him during which time he is referring her back to Dr. Brett Fairy because he said that he does not treat sleep disorders.  She is wanting an appt or a phone call from Dr. Brett Fairy as soon as possible to address this concerns.  She wanted to provide Dr. Brett Fairy with this example of the severity of her insomnia:  Dr. Casimiro Needle prescribed her Lorazepam 2mg  - she was advised that she can take up to 3.  She took 2 Lorazepam and 1 Unisom and was still unable to sleep.

## 2013-12-15 DIAGNOSIS — G441 Vascular headache, not elsewhere classified: Secondary | ICD-10-CM | POA: Diagnosis not present

## 2013-12-15 DIAGNOSIS — M9902 Segmental and somatic dysfunction of thoracic region: Secondary | ICD-10-CM | POA: Diagnosis not present

## 2013-12-15 DIAGNOSIS — M546 Pain in thoracic spine: Secondary | ICD-10-CM | POA: Diagnosis not present

## 2013-12-15 DIAGNOSIS — M9901 Segmental and somatic dysfunction of cervical region: Secondary | ICD-10-CM | POA: Diagnosis not present

## 2013-12-15 DIAGNOSIS — M9900 Segmental and somatic dysfunction of head region: Secondary | ICD-10-CM | POA: Diagnosis not present

## 2013-12-15 NOTE — Telephone Encounter (Signed)
She probably need to return to a low dose psychotropic medication- send Dr Casimiro Needle all records form the last year. I will see her ASAP. Make a Tuesday or Thursday 12.00 '0 clock.

## 2013-12-16 NOTE — Telephone Encounter (Signed)
Spoke to pt and made appt for 12-20-13 at 1200 per Dr. Allie Bossier note below.  MR sent message as well to fax records to Dr. Casimiro Needle.

## 2013-12-19 DIAGNOSIS — M9900 Segmental and somatic dysfunction of head region: Secondary | ICD-10-CM | POA: Diagnosis not present

## 2013-12-19 DIAGNOSIS — G441 Vascular headache, not elsewhere classified: Secondary | ICD-10-CM | POA: Diagnosis not present

## 2013-12-19 DIAGNOSIS — M9901 Segmental and somatic dysfunction of cervical region: Secondary | ICD-10-CM | POA: Diagnosis not present

## 2013-12-19 DIAGNOSIS — M546 Pain in thoracic spine: Secondary | ICD-10-CM | POA: Diagnosis not present

## 2013-12-19 DIAGNOSIS — M9902 Segmental and somatic dysfunction of thoracic region: Secondary | ICD-10-CM | POA: Diagnosis not present

## 2013-12-20 ENCOUNTER — Encounter: Payer: Self-pay | Admitting: Neurology

## 2013-12-20 ENCOUNTER — Ambulatory Visit (INDEPENDENT_AMBULATORY_CARE_PROVIDER_SITE_OTHER): Payer: Medicare Other | Admitting: Neurology

## 2013-12-20 ENCOUNTER — Other Ambulatory Visit: Payer: Self-pay

## 2013-12-20 VITALS — BP 118/64 | HR 65 | Temp 98.1°F | Resp 14 | Ht 66.25 in | Wt 174.0 lb

## 2013-12-20 DIAGNOSIS — F5101 Primary insomnia: Secondary | ICD-10-CM | POA: Insufficient documentation

## 2013-12-20 DIAGNOSIS — G4700 Insomnia, unspecified: Secondary | ICD-10-CM

## 2013-12-20 DIAGNOSIS — Z1231 Encounter for screening mammogram for malignant neoplasm of breast: Secondary | ICD-10-CM

## 2013-12-20 DIAGNOSIS — G4733 Obstructive sleep apnea (adult) (pediatric): Secondary | ICD-10-CM | POA: Diagnosis not present

## 2013-12-20 DIAGNOSIS — Z9989 Dependence on other enabling machines and devices: Secondary | ICD-10-CM

## 2013-12-20 DIAGNOSIS — Z1239 Encounter for other screening for malignant neoplasm of breast: Secondary | ICD-10-CM

## 2013-12-20 NOTE — Patient Instructions (Signed)
Insomnia Insomnia is frequent trouble falling and/or staying asleep. Insomnia can be a long term problem or a short term problem. Both are common. Insomnia can be a short term problem when the wakefulness is related to a certain stress or worry. Long term insomnia is often related to ongoing stress during waking hours and/or poor sleeping habits. Overtime, sleep deprivation itself can make the problem worse. Every little thing feels more severe because you are overtired and your ability to cope is decreased. CAUSES   Stress, anxiety, and depression.  Poor sleeping habits.  Distractions such as TV in the bedroom.  Naps close to bedtime.  Engaging in emotionally charged conversations before bed.  Technical reading before sleep.  Alcohol and other sedatives. They may make the problem worse. They can hurt normal sleep patterns and normal dream activity.  Stimulants such as caffeine for several hours prior to bedtime.  Pain syndromes and shortness of breath can cause insomnia.  Exercise late at night.  Changing time zones may cause sleeping problems (jet lag). It is sometimes helpful to have someone observe your sleeping patterns. They should look for periods of not breathing during the night (sleep apnea). They should also look to see how long those periods last. If you live alone or observers are uncertain, you can also be observed at a sleep clinic where your sleep patterns will be professionally monitored. Sleep apnea requires a checkup and treatment. Give your caregivers your medical history. Give your caregivers observations your family has made about your sleep.  SYMPTOMS   Not feeling rested in the morning.  Anxiety and restlessness at bedtime.  Difficulty falling and staying asleep. TREATMENT   Your caregiver may prescribe treatment for an underlying medical disorders. Your caregiver can give advice or help if you are using alcohol or other drugs for self-medication. Treatment  of underlying problems will usually eliminate insomnia problems.  Medications can be prescribed for short time use. They are generally not recommended for lengthy use.  Over-the-counter sleep medicines are not recommended for lengthy use. They can be habit forming.  You can promote easier sleeping by making lifestyle changes such as:  Using relaxation techniques that help with breathing and reduce muscle tension.  Exercising earlier in the day.  Changing your diet and the time of your last meal. No night time snacks.  Establish a regular time to go to bed.  Counseling can help with stressful problems and worry.  Soothing music and white noise may be helpful if there are background noises you cannot remove.  Stop tedious detailed work at least one hour before bedtime. HOME CARE INSTRUCTIONS   Keep a diary. Inform your caregiver about your progress. This includes any medication side effects. See your caregiver regularly. Take note of:  Times when you are asleep.  Times when you are awake during the night.  The quality of your sleep.  How you feel the next day. This information will help your caregiver care for you.  Get out of bed if you are still awake after 15 minutes. Read or do some quiet activity. Keep the lights down. Wait until you feel sleepy and go back to bed.  Keep regular sleeping and waking hours. Avoid naps.  Exercise regularly.  Avoid distractions at bedtime. Distractions include watching television or engaging in any intense or detailed activity like attempting to balance the household checkbook.  Develop a bedtime ritual. Keep a familiar routine of bathing, brushing your teeth, climbing into bed at the same   time each night, listening to soothing music. Routines increase the success of falling to sleep faster.  Use relaxation techniques. This can be using breathing and muscle tension release routines. It can also include visualizing peaceful scenes. You can  also help control troubling or intruding thoughts by keeping your mind occupied with boring or repetitive thoughts like the old concept of counting sheep. You can make it more creative like imagining planting one beautiful flower after another in your backyard garden.  During your day, work to eliminate stress. When this is not possible use some of the previous suggestions to help reduce the anxiety that accompanies stressful situations. MAKE SURE YOU:   Understand these instructions.  Will watch your condition.  Will get help right away if you are not doing well or get worse. Document Released: 02/08/2000 Document Revised: 05/05/2011 Document Reviewed: 03/10/2007 ExitCare Patient Information 2015 [employer]. This information is not intended to replace advice given to you by your health care provider. Make sure you discuss any questions you have with your health care provider.  

## 2013-12-20 NOTE — Progress Notes (Signed)
PATIENT: [name] DOB: 1940/11/27  REASON FOR VISIT: follow up for hypomania and insomnia, UARS.  HISTORY FROM: patient  HISTORY OF PRESENT ILLNESS: [name] is a 73 year old female with a history of generalized anxiety disorder, hypomania and insomnia.  She returns today for follow-up. She has felt better since her medication changed from Risperdal to Klonopin, 0.25 mg ( half of the initial dose) and lorazepam at night 2 mg.  She has more energy, she feels balanced and not pressured, agitated or impulsive. She is relaxed. She happily reports some weight loss.  Her insomnia is still a problem, but she goes to sleep first, just doesn't sleep through. She takes 3 mg melatonin , she looks for a sleep aid that allows her to get additional 3 hours of sleep after her middle of the night arousals.  Her CPAP download was reviewed today , 84% compliance,  6.40 hours average use, residual AHI was 1.6 over a period of 90 days. Download dated in office 10-03-13 .     Last note by MM.  Patient was given medication called "PURE" for psychosis and that sent her into a manic episode. She was purchasing 2000 dollars worth of clothing. She was very agitated and aggressive.  She was also having some hallucinations. She has stopped that medicine and was started on Risperdal.     She is now a patient of Dr. Casimiro Needle and he took her off Risperdal and put her clonazepam in the morning and lorazepam at night.  She states that has been working well,  with the exception that she felt tired. Patient is no longer agitated and aggressive nor having hallucination. No new medical issues since last seen. Patient states that she had 3 appointments with Dr. Casimiro Needle.  She is very happy with his care.  She has been having new difficulties sleeping after a trip to [place] , returned on 11-29-13 , this is now alleviated with the use of Unisom, a safe and non addictive medication.  She uses black out curtains in her  bedroom, her husband reads on a kindle, no TV, cool temperatures. The couple sleeps in the same bedroom.     REVIEW OF SYSTEMS: Full 14 system review of systems performed and notable only for:    Epworth 2  , GDS 2 ,  FSS 40.   Constitutional: Fatigue, improved since last visit.  Eyes: Light sensitivity Ear/Nose/Throat: N/A  Skin: N/A  Cardiovascular: N/A  Respiratory: N/A  Gastrointestinal: Constipation  Genitourinary: N/A Hematology/Lymphatic: N/A  Endocrine: He in tolerance, excessive thirst, excessive eating Musculoskeletal: Joint pain, joint swelling, aching muscles, muscle cramps, neck pain, neck stiffness Allergy/Immunology: N/A  Neurological: Headache, not acute- mainly cervicalgia. Sinuitis.  She has no sinus infection or  allergy on [place].  Psychiatric: N/A Sleep: N/A   ALLERGIES: Allergies  Allergen Reactions  . Penicillins Anaphylaxis    Tongue swelling  . Codeine Nausea Only  . Provigil [Modafinil]   . Xyrem [Sodium Oxybate]     hallucination  . Ciprofloxacin Rash  . Monistat [Miconazole] Rash  . Terazol [Terconazole] Rash    HOME MEDICATIONS: Outpatient Prescriptions Prior to Visit  Medication Sig Dispense Refill  . aspirin EC 81 MG tablet Take 81 mg by mouth daily.      . Aspirin-Acetaminophen-Caffeine (EXCEDRIN MIGRAINE PO) Take by mouth. As needed      . celecoxib (CELEBREX) 200 MG capsule Take 200 mg by mouth daily.      Marland Kitchen  clonazePAM (KLONOPIN) 0.5 MG tablet Take 0.25 mg by mouth daily as needed.       Marland Kitchen escitalopram (LEXAPRO) 20 MG tablet Take 30 mg by mouth daily. Takes 1 and 1/2 tablet      . LORazepam (ATIVAN) 1 MG tablet Take 1 mg by mouth 2 (two) times daily.      Marland Kitchen MAGNESIUM PO Take 1 tablet by mouth daily. 2-4 times per day- 150 mg      . Probiotic Product (PROBIOTIC DAILY PO) Take by mouth.      . thyroid (ARMOUR) 60 MG tablet Take 120-180 mg by mouth daily before breakfast. Takes 2 tablets every day except Sunday and Wednesday she takes  3 tablets Takes in middle of night      . valACYclovir (VALTREX) 1000 MG tablet Take 500 mg by mouth daily.      . Vitamin D, Ergocalciferol, (DRISDOL) 50000 UNITS CAPS capsule Take 50,000 Units by mouth. Two times a week      . fluconazole (DIFLUCAN) 150 MG tablet 1 tablet orally today and repeat in 3 days.  BRAND ONLY.  DO NOT SUBSTITUTE.  2 tablet  4  . hydrocortisone (ANUSOL-HC) 25 MG suppository 1 suppository in vagina at bedtime for 10d  10 suppository  0   Facility-Administered Medications Prior to Visit  Medication Dose Route Frequency Provider Last Rate Last Dose  . sodium phosphate (FLEET) 7-19 GM/118ML enema 1 enema  1 enema Rectal Daily PRN Larey Seat, MD        PAST MEDICAL HISTORY: Past Medical History  Diagnosis Date  . Migraine 10/88    Spillman  . 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 Fibromyalgia 8/98    Truslow  . 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 Plantar fasciitis   . Osteoarthritis 2007    Deveschwar  . Vitreous degeneration of right eye   . Nuclear sclerosis   . Impaired hearing     left ear  . Scarlet fever as child  . Constipation   . OSA on CPAP 2/07    uses cpap setting of 10  . Pain last week    left under breast pain   . Insomnia     circadian rhythm component  . Depression   . Thyroid disorder   . Insomnia     PAST SURGICAL HISTORY: Past Surgical History  Procedure Laterality Date  . Tonsillectomy and adenoidectomy  1952  . De quervain's release Right 10/97    Sypher  . Breast biopsy Left 6/00    Hardcastle  . 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 Tubal ligation    . Left breast biopsy    . Tonsillectomy  age 66  . Total knee arthroplasty Right 11/08/2012    Procedure: RIGHT TOTAL KNEE ARTHROPLASTY;  Surgeon: Gearlean Alf, MD;  Location: [facility];  Service: Orthopedics;  Laterality: Right;  . Root canal  02/11/2012    FAMILY HISTORY: Family History  Problem Relation Age of Onset  . Breast cancer Mother   .  Breast cancer Maternal Aunt   . Breast cancer Maternal Aunt   . Breast cancer Maternal Aunt   . Breast cancer Maternal Aunt   . Aneurysm Father   . Migraines Father   . High blood pressure Brother   . Heart disease      Grandmother    SOCIAL HISTORY: History   Social History  . Marital Status: Married    Spouse Name:  [name]    Number of Children: 0  . Years of Education: 14   Occupational History  . Not on file.   Social History Main Topics  . Smoking status: Passive Smoke Exposure - Never Smoker  . Smokeless tobacco: Never Used  . Alcohol Use: Yes     Comment: 1 glass - occas.  . Drug Use: No  . Sexual Activity: Yes    Partners: Male   Other Topics Concern  . Not on file   Social History Narrative   Patient is married [name]).   Patient drinks 2-4 cups of coffee daily.   Patient is retired.   Patient has two years of college.   Patient is right-handed.                     PHYSICAL EXAM  Filed Vitals:   12/20/13 1213  BP: 118/64  Pulse: 65  Temp: 98.1 F (36.7 C)  TempSrc: Oral  Resp: 14  Height: 5' 6.25" (1.683 m)  Weight: 174 lb (78.926 kg)   Body mass index is 27.86 kg/(m^2).  Generalized: Well developed, in no acute distress   Neck circumference 14.5 inches, Mallompatti 2-3, lost weight 185 pounds today , from 202 pounds.    Neurological examination  Mentation: Alert oriented to time, place, history taking. Follows all commands speech and language fluent Cranial nerve II-XII:  Extraocular movements were full, visual field were full on confrontational test.  Motor: The motor testing reveals 5 over 5 strength of all 4 extremities. Good symmetric motor tone is noted throughout.  Sensory: Sensory testing is intact to soft touch on all 4 extremities. No evidence of extinction is noted.  Coordination: Cerebellar testing reveals good finger-nose-finger and heel-to-shin bilaterally.  Gait and station: Gait is normal. Tandem gait is normal. Romberg  is negative. No drift is seen.  Reflexes: Deep tendon reflexes are symmetric and normal bilaterally.   DIAGNOSTIC DATA (LABS, IMAGING, TESTING) - I reviewed patient records, labs, notes, testing and imaging myself where available.   ASSESSMENT AND PLAN 73 y.o. 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 here with:  1. Mild apnea with UARS ( upper airway resistancy syndrome , on CPAP since 2007)  Well treated on only 10 cm water .  2. Hypomania single episode, treated and controlled. She took a russian sleep aid in desperation, when she slipped into mania.  3. Insomnia, cyclic resolved, chronic - present. She is very sensitive to time changes. She will travel to [place] in December.  First advise to use Ativan 1 tab at bedtime and another tablet in middle of the night.   She can continue with prn Unisom.  Patient has overall improved since the last visit.  She is no longer agitated, aggressive or having hallucinations. She has Insomnia caused after the jet lag from her [place] trip. She recovered well utilizing Unisom. Patient was seen by Dr. Casimiro Needle, who recommended follow up with me. Marland Kitchen  He maintains her on clonazepam in the morning and lorazepam in the evening. She feels that she is doing well with this. The patient will follow-up in 6 month with me, Dr. Asencion Partridge Jarissa Sheriff.      Larey Seat , MD  12/20/2013, 1:01 PM [facility] [address] [place],  [address] [phone]  Note: This document was prepared with digital dictation and possible smart phrase technology. Any transcriptional  errors that result from this process are unintentional.

## 2013-12-23 ENCOUNTER — Telehealth: Payer: Self-pay | Admitting: Obstetrics & Gynecology

## 2013-12-23 NOTE — Telephone Encounter (Signed)
Patient is asking to talk with our Engineer, building services. Patient did not give any details, just would appreciate a call back today. (fyi: patient did not seem to be upset)

## 2013-12-26 NOTE — Telephone Encounter (Signed)
Spoke with patient. Encounter closed.

## 2013-12-30 ENCOUNTER — Telehealth (HOSPITAL_COMMUNITY): Payer: Self-pay | Admitting: *Deleted

## 2013-12-30 ENCOUNTER — Ambulatory Visit: Payer: Medicare Other | Admitting: Gynecology

## 2013-12-30 NOTE — Telephone Encounter (Signed)
Request sent for medication fill of Lexapro. Pt not see in this department. Refill not appropriate.

## 2014-01-02 ENCOUNTER — Telehealth (HOSPITAL_COMMUNITY): Payer: Self-pay | Admitting: *Deleted

## 2014-01-02 NOTE — Telephone Encounter (Signed)
received faxed request for Lorazepam and Lexapro Faxed back to [facility] - "Unable to fill. Not a patient in this office"

## 2014-01-03 DIAGNOSIS — Z888 Allergy status to other drugs, medicaments and biological substances status: Secondary | ICD-10-CM | POA: Diagnosis not present

## 2014-01-03 DIAGNOSIS — J387 Other diseases of larynx: Secondary | ICD-10-CM | POA: Diagnosis not present

## 2014-01-03 DIAGNOSIS — J343 Hypertrophy of nasal turbinates: Secondary | ICD-10-CM | POA: Diagnosis not present

## 2014-01-03 DIAGNOSIS — K219 Gastro-esophageal reflux disease without esophagitis: Secondary | ICD-10-CM | POA: Diagnosis not present

## 2014-01-03 DIAGNOSIS — G4733 Obstructive sleep apnea (adult) (pediatric): Secondary | ICD-10-CM | POA: Diagnosis not present

## 2014-01-03 DIAGNOSIS — H6980 Other specified disorders of Eustachian tube, unspecified ear: Secondary | ICD-10-CM | POA: Diagnosis not present

## 2014-01-03 DIAGNOSIS — M797 Fibromyalgia: Secondary | ICD-10-CM | POA: Diagnosis not present

## 2014-01-03 DIAGNOSIS — J3489 Other specified disorders of nose and nasal sinuses: Secondary | ICD-10-CM | POA: Diagnosis not present

## 2014-01-03 DIAGNOSIS — Z7982 Long term (current) use of aspirin: Secondary | ICD-10-CM | POA: Diagnosis not present

## 2014-01-03 DIAGNOSIS — J329 Chronic sinusitis, unspecified: Secondary | ICD-10-CM | POA: Diagnosis not present

## 2014-01-03 DIAGNOSIS — J342 Deviated nasal septum: Secondary | ICD-10-CM | POA: Diagnosis not present

## 2014-01-03 DIAGNOSIS — Z880 Allergy status to penicillin: Secondary | ICD-10-CM | POA: Diagnosis not present

## 2014-01-30 ENCOUNTER — Ambulatory Visit: Payer: Medicare Other | Admitting: Obstetrics & Gynecology

## 2014-01-30 DIAGNOSIS — J342 Deviated nasal septum: Secondary | ICD-10-CM | POA: Insufficient documentation

## 2014-01-30 DIAGNOSIS — J343 Hypertrophy of nasal turbinates: Secondary | ICD-10-CM | POA: Insufficient documentation

## 2014-02-06 DIAGNOSIS — F331 Major depressive disorder, recurrent, moderate: Secondary | ICD-10-CM | POA: Diagnosis not present

## 2014-02-07 ENCOUNTER — Ambulatory Visit
Admission: RE | Admit: 2014-02-07 | Discharge: 2014-02-07 | Disposition: A | Discharge status: Home / Self Care | Payer: Medicare Other | Source: Ambulatory Visit

## 2014-02-07 DIAGNOSIS — Z1231 Encounter for screening mammogram for malignant neoplasm of breast: Secondary | ICD-10-CM | POA: Diagnosis not present

## 2014-02-15 ENCOUNTER — Encounter: Payer: Self-pay | Admitting: Obstetrics & Gynecology

## 2014-02-15 ENCOUNTER — Ambulatory Visit (INDEPENDENT_AMBULATORY_CARE_PROVIDER_SITE_OTHER): Payer: Medicare Other | Admitting: Obstetrics & Gynecology

## 2014-02-15 VITALS — BP 130/66 | HR 68 | Ht 67.0 in | Wt 178.8 lb

## 2014-02-15 DIAGNOSIS — Z124 Encounter for screening for malignant neoplasm of cervix: Secondary | ICD-10-CM | POA: Diagnosis not present

## 2014-02-15 DIAGNOSIS — Z0000 Encounter for general adult medical examination without abnormal findings: Secondary | ICD-10-CM

## 2014-02-15 DIAGNOSIS — N390 Urinary tract infection, site not specified: Secondary | ICD-10-CM | POA: Diagnosis not present

## 2014-02-15 DIAGNOSIS — R82998 Other abnormal findings in urine: Secondary | ICD-10-CM

## 2014-02-15 DIAGNOSIS — Z01419 Encounter for gynecological examination (general) (routine) without abnormal findings: Secondary | ICD-10-CM | POA: Diagnosis not present

## 2014-02-15 MED ORDER — VALACYCLOVIR HCL 1 G PO TABS: 500.0000 mg | ORAL_TABLET | Freq: Every day | ORAL | Status: DC

## 2014-02-15 MED ORDER — FLUCONAZOLE 150 MG PO TABS: ORAL_TABLET | ORAL | Status: DC

## 2014-02-15 MED ORDER — TRIMETHOPRIM 100 MG PO TABS: 100.0000 mg | ORAL_TABLET | Freq: Two times a day (BID) | ORAL | Status: DC

## 2014-02-15 NOTE — Patient Instructions (Addendum)
Brenda Neer, MD [name] [address], [place]

## 2014-02-15 NOTE — Progress Notes (Signed)
73 y.o. G0P0 MarriedCaucasianF here for annual exam.  No vaginal bleeding.  Pt stopped her Lunesta earlier this year.  Taking Unisom at night.  Being followed by Dr. Brett Fairy.  Pt had manic episode after taking a "supplement" she got off the Internet.    Patient's last menstrual period was 02/25/1995.          Sexually active: Yes.    The current method of family planning is post menopausal status.    Exercising: Yes.    water aerobics 4x/wk, walking 3-4/xwk Smoker:  no  Health Maintenance: Pap: 11/04/11 Negative History of abnormal Pap:  no MMG:  02/07/14 Bi-Rads 1: Negative Colonoscopy:  07/15/10 polyps x2 - f/u in 5 years BMD:   2015 with Dr. Forde Dandy at [facility], minimal change TDaP: 10/10/10   Screening Labs: Hgb today: No, Urine today: Leuks ++   reports that she has been passively smoking.  She has never used smokeless tobacco. She reports that she drinks alcohol. She reports that she does not use illicit drugs.  Past Medical History  Diagnosis Date  . Migraine 10/88    Spillman  . 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 Fibromyalgia 8/98    Truslow  . 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 Plantar fasciitis   . Osteoarthritis 2007    Deveschwar  . Vitreous degeneration of right eye   . Nuclear sclerosis   . Impaired hearing     left ear  . Scarlet fever as child  . Constipation   . OSA on CPAP 2/07    uses cpap setting of 10  . Pain last week    left under breast pain   . Insomnia     circadian rhythm component  . Depression   . Thyroid disorder   . Insomnia     Past Surgical History  Procedure Laterality Date  . Tonsillectomy and adenoidectomy  1952  . De quervain's release Right 10/97    Sypher  . Breast biopsy Left 6/00    Hardcastle  . 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 Tubal ligation    . Left breast biopsy    . Tonsillectomy  age 5  . Total knee arthroplasty Right 11/08/2012    Procedure: RIGHT TOTAL KNEE ARTHROPLASTY;  Surgeon:  Gearlean Alf, MD;  Location: [facility];  Service: Orthopedics;  Laterality: Right;  . Root canal  02/11/2012    Current Outpatient Prescriptions  Medication Sig Dispense Refill  . aspirin EC 81 MG tablet Take 81 mg by mouth daily.    . celecoxib (CELEBREX) 200 MG capsule Take 200 mg by mouth daily.    . clonazePAM (KLONOPIN) 0.5 MG tablet Take 0.25 mg by mouth daily as needed.     Marland Kitchen escitalopram (LEXAPRO) 20 MG tablet Take 30 mg by mouth daily. Takes 1 and 1/2 tablet    . LORazepam (ATIVAN) 1 MG tablet Take 1 mg by mouth 2 (two) times daily.    Marland Kitchen MAGNESIUM PO Take 1 tablet by mouth daily. 2-4 times per day- 150 mg    . Probiotic Product (PROBIOTIC DAILY PO) Take by mouth.    . thyroid (ARMOUR) 60 MG tablet Take 120-180 mg by mouth daily before breakfast. Takes 2 tablets every day except Sunday and Wednesday she takes 3 tablets Takes in middle of night    . valACYclovir (VALTREX) 1000 MG tablet Take 500 mg by mouth daily.    . Vitamin D, Ergocalciferol, (DRISDOL)  50000 UNITS CAPS capsule Take 50,000 Units by mouth. Two times a week    . Aspirin-Acetaminophen-Caffeine (EXCEDRIN MIGRAINE PO) Take by mouth. As needed    . fluconazole (DIFLUCAN) 150 MG tablet 1 tablet orally today and repeat in 3 days.  BRAND ONLY.  DO NOT SUBSTITUTE. (Patient not taking: Reported on 02/15/2014) 2 tablet 4   Current Facility-Administered Medications  Medication Dose Route Frequency Provider Last Rate Last Dose  . sodium phosphate (FLEET) 7-19 GM/118ML enema 1 enema  1 enema Rectal Daily PRN Larey Seat, MD        Family History  Problem Relation Age of Onset  . Breast cancer Mother   . Breast cancer Maternal Aunt   . Breast cancer Maternal Aunt   . Breast cancer Maternal Aunt   . Breast cancer Maternal Aunt   . Aneurysm Father   . Migraines Father   . High blood pressure Brother   . Heart disease      Grandmother    ROS:  Pertinent items are noted in HPI.  Otherwise, a comprehensive ROS was  negative.  Exam:   BP 130/66 mmHg  Pulse 68  Ht 5\' 7"  (1.702 m)  Wt 178 lb 12.8 oz (81.103 kg)  BMI 28.00 kg/m2  LMP 02/25/1995  Weight change: -6#  Height: 5\' 7"  (170.2 cm)  Ht Readings from Last 3 Encounters:  02/15/14 5\' 7"  (1.702 m)  12/20/13 5' 6.25" (1.683 m)  12/02/13 5\' 7"  (1.702 m)    General appearance: alert, cooperative and appears stated age Head: Normocephalic, without obvious abnormality, atraumatic Neck: no adenopathy, supple, symmetrical, trachea midline and thyroid normal to inspection and palpation Lungs: clear to auscultation bilaterally Breasts: normal appearance, no masses or tenderness Heart: regular rate and rhythm Abdomen: soft, non-tender; bowel sounds normal; no masses,  no organomegaly Extremities: extremities normal, atraumatic, no cyanosis or edema Skin: Skin color, texture, turgor normal. No rashes or lesions Lymph nodes: Cervical, supraclavicular, and axillary nodes normal. No abnormal inguinal nodes palpated Neurologic: Grossly normal   Pelvic: External genitalia:  no lesions              Urethra:  normal appearing urethra with no masses, tenderness or lesions              Bartholins and Skenes: normal                 Vagina: normal appearing vagina with normal color and discharge, no lesions              Cervix: no lesions              Pap taken: Yes.   Bimanual Exam:  Uterus:  normal size, contour, position, consistency, mobility, non-tender              Adnexa: normal adnexa and no mass, fullness, tenderness               Rectovaginal: Confirms               Anus:  normal sphincter tone, no lesions  Chaperone was present for exam.  A:  Well Woman with normal exam PMP, no HRT Weight loss (pt working on this) Fibromyalgia Family hx of breast cancer Dense breasts H/O post-coital UTI  P: Mammogram yearly. 3D MMG discussed. pap smear done 2013. Pap obtained today. Valtrex 1 gm daily. #90/0RF Trimethoprim 100mg  post  coitally #90/0 Urine culture return annually or prn

## 2014-02-17 LAB — URINE CULTURE
Colony Count: NO GROWTH
Organism ID, Bacteria: NO GROWTH

## 2014-02-20 ENCOUNTER — Telehealth: Payer: Self-pay

## 2014-02-20 LAB — IPS PAP SMEAR ONLY

## 2014-02-20 NOTE — Telephone Encounter (Signed)
Lmtcb//kn 

## 2014-02-20 NOTE — Telephone Encounter (Signed)
-----   Message from Lyman Speller, MD sent at 02/20/2014 11:17 AM EST ----- Inform pt this was negative.  I don't know if she ended up taking the antibiotics over the weekend but if she started them, she can stop.  Thanks.

## 2014-02-20 NOTE — Telephone Encounter (Signed)
Spoke with patient. Advised patient of message as seen below from Dr.Miller. Patient is agreeable and verbalizes understanding.  Routing to provider for final review. Patient agreeable to disposition. Will close encounter   

## 2014-03-15 DIAGNOSIS — Z808 Family history of malignant neoplasm of other organs or systems: Secondary | ICD-10-CM | POA: Diagnosis not present

## 2014-03-15 DIAGNOSIS — D1722 Benign lipomatous neoplasm of skin and subcutaneous tissue of left arm: Secondary | ICD-10-CM | POA: Diagnosis not present

## 2014-03-15 DIAGNOSIS — D2261 Melanocytic nevi of right upper limb, including shoulder: Secondary | ICD-10-CM | POA: Diagnosis not present

## 2014-03-15 DIAGNOSIS — L570 Actinic keratosis: Secondary | ICD-10-CM | POA: Diagnosis not present

## 2014-03-15 DIAGNOSIS — L821 Other seborrheic keratosis: Secondary | ICD-10-CM | POA: Diagnosis not present

## 2014-03-15 DIAGNOSIS — L719 Rosacea, unspecified: Secondary | ICD-10-CM | POA: Diagnosis not present

## 2014-03-15 DIAGNOSIS — D2271 Melanocytic nevi of right lower limb, including hip: Secondary | ICD-10-CM | POA: Diagnosis not present

## 2014-03-15 DIAGNOSIS — D1800 Hemangioma unspecified site: Secondary | ICD-10-CM | POA: Diagnosis not present

## 2014-03-21 DIAGNOSIS — M1990 Unspecified osteoarthritis, unspecified site: Secondary | ICD-10-CM | POA: Diagnosis not present

## 2014-03-21 DIAGNOSIS — M79671 Pain in right foot: Secondary | ICD-10-CM | POA: Diagnosis not present

## 2014-03-21 DIAGNOSIS — M19041 Primary osteoarthritis, right hand: Secondary | ICD-10-CM | POA: Diagnosis not present

## 2014-03-21 DIAGNOSIS — M797 Fibromyalgia: Secondary | ICD-10-CM | POA: Diagnosis not present

## 2014-03-22 ENCOUNTER — Encounter: Payer: Self-pay | Admitting: Sports Medicine

## 2014-03-22 ENCOUNTER — Ambulatory Visit (INDEPENDENT_AMBULATORY_CARE_PROVIDER_SITE_OTHER): Payer: Medicare Other | Admitting: Sports Medicine

## 2014-03-22 VITALS — BP 113/75 | Ht 67.0 in | Wt 156.0 lb

## 2014-03-22 DIAGNOSIS — M5030 Other cervical disc degeneration, unspecified cervical region: Secondary | ICD-10-CM

## 2014-03-22 DIAGNOSIS — M25531 Pain in right wrist: Secondary | ICD-10-CM

## 2014-03-22 DIAGNOSIS — M170 Bilateral primary osteoarthritis of knee: Secondary | ICD-10-CM

## 2014-03-22 DIAGNOSIS — M47812 Spondylosis without myelopathy or radiculopathy, cervical region: Secondary | ICD-10-CM | POA: Insufficient documentation

## 2014-03-22 DIAGNOSIS — M79641 Pain in right hand: Secondary | ICD-10-CM

## 2014-03-22 MED ORDER — TRAMADOL HCL 50 MG PO TABS: 50.0000 mg | ORAL_TABLET | Freq: Four times a day (QID) | ORAL | Status: DC | PRN

## 2014-03-22 NOTE — Progress Notes (Signed)
Subjective:    Patient ID: [name], female    DOB: 1940-04-06, 74 y.o.   MRN: [id number]  HPI [name] is a 74 yo female who presents with right hand pain and left upper back pain.   Her right hand pain is described as being located between the MCP joint of the thumb and index finger. The pain has been present for the past 6 months or so and is described as being sharp in nature, radiating into her lower arm at times. The pain is worsened with hand shaking and particularly when having her husband help her up from a seated position into a stand. She has been taking Celebrex for a number of years, and this pain persists despite regular use of this medication. 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 No recent injuries. No subjective weakness, instability, or limitations in range of motion.   Her left upper back pain started last night while on the telephone. She reports extending her neck towards the right and feeling a very sharp pain in her upper left back. She had never experienced this pain before. She used a heating pad on the area last night and the pain seemed to significantly improve. No numbness or tingling radiating down into either arm. No neck pain. She reports having difficulty turning her head laterally to look at items at times, but reports no other limitations. The pain is not present today.   Has a prior MRI showing 2 level Cervical DDD   Review of Systems   S/P bilateral TKR by Dr Elmyra Ricks. Both have done well but she still gets stiffness after sitting.  No real pain.  Generalized aches in several joints which Celebrex relieves.  Objective:   Physical Exam BP 113/75 mmHg  Ht 5\' 7"  (1.702 m)  Wt 156 lb (70.761 kg)  BMI 24.43 kg/m2  LMP 02/25/1995 General: well-appearing, very pleasant female in no acute distress.   Right Hand:  Inspection: well-healed incision from previous surgery near radiocarpal joint; no  swelling, deformity or discoloration Palpation: mild tenderness to palpation in region between thumb and index finger and along the UCL of thumb ROM: full, painless ROM in all planes Strength: 5/5 with flexion, extension, radial and ulnar deviation of wrist; 5/5 with flexion and extension of all fingers; 5/5 with grip Special: valgus stress applied to UCL of right thumb produces approximately 10 degrees more motion than left thumb Neurovascular: neurovascularly intact  Neck Exam: Inspection: straight, midline posture; no swelling or deformity Palpation: no tenderness to palpation of the cervical vertebral spinous processes, sternocleidomastoids, or trapezius ROM: full flexion and extension with significantly reduced lateral rotation bilaterally, with only about 30 deg of lateral rotation; lateral bending is also limited with Left > RT Strength: 5/5 with flexion, extension and lateral rotation Special: negative Spurling's  Neurovascular: neurovascularly intact distally  Hip/ Quad strength is good in all planes and knee flexion is full     Assessment & Plan:   1. Right Hand Pain:  Given the location of this pain between the index finger and thumb, worsening of pain when being pulled upward or shaking hands, and increased laxity of UCL with valgus stress, this appears to be caused by UCL injury or at least laxity.   - Provided with wrist loop  - Prescribed Tramadol, 50 mg in the morning and 50mg  at night. Can discontinue the Celebrex for the time being and try the tramadol.  2. Neck Pain:  The pain onset occuring with extension of the neck, and her significantly limited range of motion with lateral rotation make this most consistent with facet joint osteoarthritis. There is likely some spurring of these joints, and lateral rotation likely irritates nerves exiting the foramina.   - Prescribed Exercises including: Push forward, backwards, and side to side with your neck holding for 10  seconds. No movement, just force in each direction. - Provided soft collar to wear at night and while watching TV to relieve pain.   3. History of Knee Replacement:  For continued mobility, discussed ways to maximize function.   - Encouraged starting biking 3 times per week, initially for 20 minutes with goal of building up to 45 minutes - Encouraged performing sit to stands several times daily - Encouraged lateral leg lifts to strengthen hip abductors  Follow-up as needed.    This patient was seen with and note was dictated by Crissie Sickles, MS4.   I edited and agree with the assessment and documentatin.  Ila Mcgill, MD

## 2014-03-22 NOTE — Assessment & Plan Note (Signed)
Isometric HEP  Periodic use of cervical collar  ROM

## 2014-03-22 NOTE — Assessment & Plan Note (Signed)
See HEP

## 2014-03-22 NOTE — Patient Instructions (Signed)
Very nice to see you in clinic today.   For your knees:  - Bike 3 times per week, starting with 20 minutes and build up to 45 minutes to strengthen muscles around knees - Sit to stands a few times daily - laying on your side, lift your leg up into the air. Try 3 sets of 15 per day on each side - We are giving you a prescription for a medicine called Tramadol. You will take 50mg  in the morning, and 50mg  at night instead of the Celebrex. - We are also going to give you something to wear for your hand, wear as much as tolerated. Do not have to wear at night.    For your neck:  - Exercise: Push forward, backwards, and side to side with your neck holding for 10 seconds. No movement, just force in each direction. - Wear the rigid collar at night while watching TV or sleeping   Thank you.

## 2014-03-29 ENCOUNTER — Encounter: Payer: Self-pay | Admitting: Certified Nurse Midwife

## 2014-03-29 ENCOUNTER — Ambulatory Visit (INDEPENDENT_AMBULATORY_CARE_PROVIDER_SITE_OTHER): Payer: Medicare Other | Admitting: Certified Nurse Midwife

## 2014-03-29 VITALS — BP 110/68 | HR 70 | Temp 98.1°F | Resp 16 | Ht 67.0 in | Wt 158.0 lb

## 2014-03-29 DIAGNOSIS — B373 Candidiasis of vulva and vagina: Secondary | ICD-10-CM

## 2014-03-29 DIAGNOSIS — N390 Urinary tract infection, site not specified: Secondary | ICD-10-CM | POA: Diagnosis not present

## 2014-03-29 DIAGNOSIS — B3731 Acute candidiasis of vulva and vagina: Secondary | ICD-10-CM

## 2014-03-29 LAB — POCT URINALYSIS DIPSTICK
Bilirubin, UA: NEGATIVE
Blood, UA: NEGATIVE
Glucose, UA: NEGATIVE
Ketones, UA: NEGATIVE
Nitrite, UA: NEGATIVE
Protein, UA: NEGATIVE
Urobilinogen, UA: NEGATIVE
pH, UA: 5

## 2014-03-29 MED ORDER — FLUCONAZOLE 150 MG PO TABS: ORAL_TABLET | ORAL | Status: DC

## 2014-03-29 NOTE — Progress Notes (Signed)
74 y.o. married white female g0p0 here with complaint of UTI, with onset  on 6-7 days ago. Patient complaining of vaginal itching and slight burning. Patient denies fever, chills, nausea or back pain, but does have stress incontinence which is new.Has noticed some urgency only. No new personal products. Patient feels not related to sexual activity. Denies new personal products, but has vaginal dryness and not using anything for it consistently.. . Menopausal with no vaginal bleeding reported. Leaving to go out of town to [place] in one week. Patient can not use generic Diflucan, it does not resolve symptoms and has skin reaction to Monistat or Terazol. Brought pre authorization for Diflucan with her. Patient is in hydro therapy for knees 3 x weekly which increases yeast production also. No other problems today.   O: Healthy female WDWN Affect: Normal, orientation x 3 Skin : warm and dry CVAT: negative bilateral Abdomen: negative for suprapubic tenderness, soft  Pelvic exam: External genital area: atrophic appearance normal, no lesions no scaling or exudate Bladder,Urethra, Urethral meatus:very slight tenderness Vagina: atrophic appearance with white thin discharge, redness noted wet prep taken, ph 4.5 Cervix: normal, non tender Uterus:normal,non tender Adnexa: normal non tender, no fullness or masses   A: Yeast vaginitis R/O UTI new onset stress incontinence Atrophic vaginitis  Poct urine- wbc tr  P: Reviewed findings of yeast vaginitis and need for treatment. Discussed staying wet after pool exposure, not only increases risk of yeast but also contributes to dryness. Suggested she apply coconut oil on all vaginal tissue external and internal to protect prior to pool use. Also start using coconut oil daily for vaginal dryness. Does not want any hormonal products.  Patient will try to remember to do this to stop yeast occurrence. Rx Diflucan see order Pre authorization completed and will  be submitted for patient. Lab: Urine micro and culture Encouraged patient to increase water intake and is aware of UTI symptoms.  Rv prn SMO:LMBEM micro, culture Reviewed warning signs and symptoms of UTI Encouraged to limit soda, tea, and coffee RV 2 week if TOC needed for positive culture  RV prn

## 2014-03-29 NOTE — Patient Instructions (Signed)

## 2014-03-29 NOTE — Progress Notes (Signed)
Reviewed personally.  M. Suzanne Airen Dales, MD.  

## 2014-03-30 ENCOUNTER — Telehealth: Payer: Self-pay

## 2014-03-30 LAB — URINALYSIS, MICROSCOPIC ONLY
Bacteria, UA: NONE SEEN
Casts: NONE SEEN
Crystals: NONE SEEN
Squamous Epithelial / LPF: NONE SEEN

## 2014-03-30 LAB — URINE CULTURE
Colony Count: NO GROWTH
Organism ID, Bacteria: NO GROWTH

## 2014-03-30 NOTE — Telephone Encounter (Signed)
Spoke with patient. Advised patient prescription for Diflucan brand only was approved for 36 months (until 03/30/2017). Patient states "The way Dr.Miller wrote it allowed me to get two per month. My insurance usually doesn't allow that. Can you check to see how she wrote it so it can be the same this time?" Patient is unable to tell me if it was for two pills or two prescriptions. At last OV with Dr.Miller on 02/15/2014 patient was given Diflucan #2 1RF. "I was taking it like once a month at one point and then once every week at once point. Could Dr.Miller write one of those prescriptions again in addition to this one?" Advised patient will need to speak with Dr.Miller and return call. Patient is agreeable. Patient would also like to check old rx bottle at home to verify directions.

## 2014-03-31 MED ORDER — FLUCONAZOLE 150 MG PO TABS: ORAL_TABLET | ORAL | Status: DC

## 2014-03-31 NOTE — Telephone Encounter (Signed)
Spoke with patient. Advised of message as seen below from [place]. Patient is agreeable. Rx for Diflucan 150mg  #4 0RF brand only sent to [facility]. Patient is agreeable.  Routing to provider for final review. Patient agreeable to disposition. Will close encounter

## 2014-03-31 NOTE — Telephone Encounter (Signed)
Left message to call Kaitlyn at [phone]. 

## 2014-03-31 NOTE — Telephone Encounter (Signed)
I typically write rx as 150mg  po x 1, repeat 48 hours.  Brand only.  At one point, her insurance would only cover one branded pill a month.  It is ok to send rx for diflucan 150mg  po weekly x 4 weeks.  #4/0RF, as well.  Brand only.  Thanks.

## 2014-04-03 ENCOUNTER — Telehealth: Payer: Self-pay | Admitting: *Deleted

## 2014-04-03 DIAGNOSIS — E785 Hyperlipidemia, unspecified: Secondary | ICD-10-CM | POA: Diagnosis not present

## 2014-04-03 DIAGNOSIS — M797 Fibromyalgia: Secondary | ICD-10-CM | POA: Diagnosis not present

## 2014-04-03 DIAGNOSIS — Z6825 Body mass index (BMI) 25.0-25.9, adult: Secondary | ICD-10-CM | POA: Diagnosis not present

## 2014-04-03 DIAGNOSIS — E038 Other specified hypothyroidism: Secondary | ICD-10-CM | POA: Diagnosis not present

## 2014-04-03 DIAGNOSIS — M859 Disorder of bone density and structure, unspecified: Secondary | ICD-10-CM | POA: Diagnosis not present

## 2014-04-03 DIAGNOSIS — E559 Vitamin D deficiency, unspecified: Secondary | ICD-10-CM | POA: Diagnosis not present

## 2014-04-03 DIAGNOSIS — G4739 Other sleep apnea: Secondary | ICD-10-CM | POA: Diagnosis not present

## 2014-04-03 NOTE — Telephone Encounter (Signed)
Yes, brand only.  Thanks.

## 2014-04-03 NOTE — Telephone Encounter (Addendum)
Fax from [facility] requesting generic equivalent of Diflucan 150 mg.  According to telephone encounter Diflucan 150 mg #4/0rfs brand name only was sent in.  Wanting to confirm brand name only no generic in regards to this medication for patient.  Please advise.

## 2014-04-04 ENCOUNTER — Ambulatory Visit: Payer: Medicare Other | Admitting: Neurology

## 2014-04-04 NOTE — Telephone Encounter (Signed)
Fax faxed back to [facility] stating that patient needs brand name only.  Okay to close encounter?

## 2014-04-04 NOTE — Telephone Encounter (Signed)
Yes, encounter is closed.

## 2014-04-18 ENCOUNTER — Encounter: Payer: Self-pay | Admitting: Neurology

## 2014-04-18 ENCOUNTER — Ambulatory Visit (INDEPENDENT_AMBULATORY_CARE_PROVIDER_SITE_OTHER): Payer: Medicare Other | Admitting: Neurology

## 2014-04-18 VITALS — BP 111/66 | HR 73 | Resp 14 | Ht 66.0 in | Wt 168.0 lb

## 2014-04-18 DIAGNOSIS — Z9989 Dependence on other enabling machines and devices: Principal | ICD-10-CM

## 2014-04-18 DIAGNOSIS — G4733 Obstructive sleep apnea (adult) (pediatric): Secondary | ICD-10-CM | POA: Diagnosis not present

## 2014-04-18 NOTE — Patient Instructions (Signed)

## 2014-04-18 NOTE — Progress Notes (Signed)
PATIENT: [name] DOB: [birth year]  REASON FOR VISIT: follow up for hypomania and insomnia, UARS.  HISTORY FROM: patient  HISTORY OF PRESENT ILLNESS: [name] is a 74 year old female with a history of generalized anxiety disorder, hypomania and insomnia.  She returns today for follow-up.   We are meeting today to discuss the download from the patient's CPAP machine and she has an 82% compliance over the last 90 days for 7 hours and 17 minutes of daily use of CPAP. Her residual AHI is wonderful at 1.6 very well controlled the pressure is set at 10 cm water without EPR, she has a moderate air leak she she was unable to use the CPAP machine for several days due to a congested nasal passage and likely a viral infection. She has returned for a Journey to [place] and trouble with the time zone changes. 2 Unisom at night. No Lunesta.   Last note by MM. 2015  Patient was given medication called "PURE" for psychosis and that sent her into a manic episode. She was purchasing 2000 dollars worth of clothing. She was very agitated and aggressive.  She was also having some hallucinations. She has stopped that medicine and was started on Risperdal.  She is now a patient of Dr. Casimiro Needle, who  took her off Risperdal and put her clonazepam in the morning and lorazepam at night.  She states that has been working well, with the exception that she felt tired. Patient is no longer agitated and aggressive nor having hallucination. No new medical issues since last seen. Patient states that she had 3 appointments with Dr. Casimiro Needle.  She is very happy with his care.  She has been having new difficulties sleeping after a trip to [place], returned on 11-29-13, this is now alleviated with the use of Unisom, a safe and non addictive medication. She uses black out curtains in her bedroom, her husband reads on a kindle, no TV, cool temperatures. The couple sleeps in the same bedroom.     REVIEW OF SYSTEMS: Full 14  system review of systems performed and notable only for:    04-18-14: Epworth 1 , GDS 2 ,  FSS 51   Constitutional: Fatigue, improved since last visit.  Eyes: Light sensitivity Gastrointestinal: Constipation  Endocrine: He in tolerance, excessive thirst, excessive eating Musculoskeletal: Joint pain, joint swelling, aching muscles, muscle cramps, neck pain, neck stiffness Neurological: Headache, not acute- mainly cervicalgia. Sinuitis.  She has a sinus infection in [place].    ALLERGIES: Allergies  Allergen Reactions  . Penicillins Anaphylaxis    Tongue swelling  . Codeine Nausea Only  . Provigil [Modafinil]   . Xyrem [Sodium Oxybate]     hallucination  . Ciprofloxacin Rash  . Monistat [Miconazole] Rash  . Terazol [Terconazole] Rash    HOME MEDICATIONS: Outpatient Prescriptions Prior to Visit  Medication Sig Dispense Refill  . aspirin EC 81 MG tablet Take 81 mg by mouth daily.    . Aspirin-Acetaminophen-Caffeine (EXCEDRIN MIGRAINE PO) Take by mouth. As needed    . celecoxib (CELEBREX) 200 MG capsule Take 200 mg by mouth daily.    . fluconazole (DIFLUCAN) 150 MG tablet Take one pill weekly for four weeks. Dispense as BRAND only. 4 tablet 0  . LORazepam (ATIVAN) 1 MG tablet Take 1 mg by mouth 2 (two) times daily.    Marland Kitchen MAGNESIUM PO Take 1 tablet by mouth daily. 2-4 times per day- 150 mg    . Probiotic Product (  PROBIOTIC DAILY PO) Take by mouth.    . thyroid (ARMOUR) 60 MG tablet Take 120-180 mg by mouth daily before breakfast. Takes three tablets once per week.    . trimethoprim (TRIMPEX) 100 MG tablet Take 1 tablet (100 mg total) by mouth 2 (two) times daily. 90 tablet 0  . valACYclovir (VALTREX) 1000 MG tablet Take 0.5 tablets (500 mg total) by mouth daily. 90 tablet 0  . clonazePAM (KLONOPIN) 0.5 MG tablet Take 0.25 mg by mouth daily as needed.     Marland Kitchen escitalopram (LEXAPRO) 20 MG tablet Take 20 mg by mouth daily.     . fluconazole (DIFLUCAN) 150 MG tablet Take one  tablet.  Repeat one in 5 days. 2 tablet 0  . Vitamin D, Ergocalciferol, (DRISDOL) 50000 UNITS CAPS capsule Take 50,000 Units by mouth every 7 (seven) days. Two times a week     Facility-Administered Medications Prior to Visit  Medication Dose Route Frequency Provider Last Rate Last Dose  . sodium phosphate (FLEET) 7-19 GM/118ML enema 1 enema  1 enema Rectal Daily PRN Larey Seat, MD        PAST MEDICAL HISTORY: Past Medical History  Diagnosis Date  . Migraine 10/88    Spillman  . 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 Fibromyalgia 8/98    Truslow  . 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 Plantar fasciitis   . Osteoarthritis 2007    Deveschwar  . Vitreous degeneration of right eye   . Nuclear sclerosis   . Impaired hearing     left ear  . Scarlet fever as child  . Constipation   . OSA on CPAP 2/07    uses cpap setting of 10  . Pain last week    left under breast pain   . Insomnia     circadian rhythm component  . Depression   . Thyroid disorder   . Insomnia     PAST SURGICAL HISTORY: Past Surgical History  Procedure Laterality Date  . Tonsillectomy and adenoidectomy  1952  . De quervain's release Right 10/97    Sypher  . Breast biopsy Left 6/00    Hardcastle  . 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 Tubal ligation    . Left breast biopsy    . Tonsillectomy  age 22  . Total knee arthroplasty Right 11/08/2012    Procedure: RIGHT TOTAL KNEE ARTHROPLASTY;  Surgeon: Gearlean Alf, MD;  Location: [facility];  Service: Orthopedics;  Laterality: Right;  . Root canal  02/11/2012    FAMILY HISTORY: Family History  Problem Relation Age of Onset  . Breast cancer Mother   . Breast cancer Maternal Aunt   . Breast cancer Maternal Aunt   . Breast cancer Maternal Aunt   . Breast cancer Maternal Aunt   . Aneurysm Father   . Migraines Father   . High blood pressure Brother   . Heart disease      Grandmother    SOCIAL HISTORY: History   Social History  .  Marital Status: Married    Spouse Name: [name]  . Number of Children: 0  . Years of Education: 14   Occupational History  . Not on file.   Social History Main Topics  . Smoking status: Passive Smoke Exposure - Never Smoker  . Smokeless tobacco: Never Used  . Alcohol Use: Yes     Comment: 1 glass - occas.  . Drug Use: No  . Sexual Activity:  Partners: Male   Other Topics Concern  . Not on file   Social History Narrative   Patient is married [name]).   Patient drinks 2-4 cups of coffee daily.   Patient is retired.   Patient has two years of college.   Patient is right-handed.                     PHYSICAL EXAM  Filed Vitals:   04/18/14 1426  BP: 111/66  Pulse: 73  Resp: 14  Height: 5\' 6"  (1.676 m)  Weight: 168 lb (76.204 kg)   Body mass index is 27.13 kg/(m^2).  Generalized: Well developed, in no acute distress   Neck circumference 14.5 inches, Mallompatti 2-3, lost weight 185 pounds today , from 202 pounds.    Neurological examination  Mentation: Alert oriented to time, place, history taking. Follows all commands speech and language fluent Cranial nerve II-XII:  Extraocular movements were full, visual field were full on confrontational test.  Motor: The motor testing reveals 5 over 5 strength of all 4 extremities. Good symmetric motor tone is noted throughout.  Sensory: Sensory testing is intact to soft touch on all 4 extremities. No evidence of extinction is noted.  Coordination: Cerebellar testing reveals good finger-nose-finger and heel-to-shin bilaterally.  Gait and station: Gait is normal. Tandem gait is normal. Romberg is negative. No drift is seen.  Reflexes: Deep tendon reflexes are symmetric and normal bilaterally.   DIAGNOSTIC DATA (LABS, IMAGING, TESTING) - I reviewed patient records, labs, notes, testing and imaging myself where available.   ASSESSMENT AND PLAN:  1. 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  2. Hypomania single episode, treated and controlled. She took a russian sleep aid in desperation, when she slipped into mania.  3. Insomnia, cyclic resolved, chronic - present. She is very sensitive to time changes. She will travel to [place] in December.  First advise to use Ativan 1 tab at bedtime and another tablet in middle of the night.   She can continue with prn Unisom. Patient has overall improved since the last visit. She is no longer agitated, aggressive or having hallucinations. She has Insomnia caused after the jet lag from her [place] trip. She recovered well utilizing Unisom. Patient was seen by Dr. Casimiro Needle, who recommended follow up with me. Marland Kitchen  He maintains her on clonazepam in the morning and lorazepam in the evening. She feels that she is doing well with this.  The patient will follow-up  For 30 minutes in 6 month with me, Dr. Asencion Partridge Trianna Lupien.    Larey Seat , MD    04/18/2014, 2:41 PM    [facility] [address] [place], [place] [address] [phone]

## 2014-04-19 ENCOUNTER — Telehealth: Payer: Self-pay | Admitting: Obstetrics & Gynecology

## 2014-04-19 NOTE — Telephone Encounter (Signed)
Spoke with patient. Patient states that she has a yeast infection that will not go away. "I have had it since I was last there. It is not going away and is only getting worse." Patient was last seen on 03/29/2014 with Regina Eck CNM. Patient states that she has taken all of the diflucan from appointment with no relief. Advised patient will need to be seen for further evaluation in the office. Patient is agreeable and request to see MD. Offered appointment tomorrow with Dr.Silva but patient declines due to another appointment. Appointment scheduled for 2/26 at 10:30am with Dr.Silva. Patient is agreeable to date and time.  Routing to provider for final review. Patient agreeable to disposition. Will close encounter

## 2014-04-19 NOTE — Telephone Encounter (Signed)
Patient is having recurring yeast infections and wants to see an MD only. She states this problem is just getting worst and she wants to come in as soon as possible.

## 2014-04-21 ENCOUNTER — Encounter: Payer: Self-pay | Admitting: Obstetrics and Gynecology

## 2014-04-21 ENCOUNTER — Ambulatory Visit (INDEPENDENT_AMBULATORY_CARE_PROVIDER_SITE_OTHER): Payer: Medicare Other | Admitting: Obstetrics and Gynecology

## 2014-04-21 VITALS — BP 110/64 | HR 70 | Ht 67.0 in | Wt 160.2 lb

## 2014-04-21 DIAGNOSIS — N761 Subacute and chronic vaginitis: Secondary | ICD-10-CM | POA: Diagnosis not present

## 2014-04-21 DIAGNOSIS — N393 Stress incontinence (female) (male): Secondary | ICD-10-CM | POA: Diagnosis not present

## 2014-04-21 DIAGNOSIS — M545 Low back pain: Secondary | ICD-10-CM | POA: Diagnosis not present

## 2014-04-21 DIAGNOSIS — N760 Acute vaginitis: Secondary | ICD-10-CM | POA: Diagnosis not present

## 2014-04-21 LAB — POCT URINALYSIS DIPSTICK
Bilirubin, UA: NEGATIVE
Blood, UA: NEGATIVE
Glucose, UA: NEGATIVE
Ketones, UA: NEGATIVE
Leukocytes, UA: NEGATIVE
Nitrite, UA: NEGATIVE
Protein, UA: NEGATIVE
Urobilinogen, UA: NEGATIVE
pH, UA: 5

## 2014-04-21 NOTE — Patient Instructions (Signed)

## 2014-04-21 NOTE — Progress Notes (Signed)
Patient ID: [name], female   DOB: 10-28-1940, 74 y.o.   MRN: [id number] GYNECOLOGY VISIT  PCP:  C. Melinda Crutch, MD  Referring provider:   HPI: 74 y.o.   Married  Caucasian  female   G0P0 with Patient's last menstrual period was 02/25/1995.   here for vaginal discharge/sticky with irritation and itching.  Patient also concerned she may have a UTI.   Having low back pain.  Just did some extensive car travel.  Mild stress incontinence.   Some itching external and internal and does not stop.  Drainage vaginally but cannot tell the color.  No vaginal odor.   States she has a long history of yeast vaginitis.  Allergic to Terazol and Monistat.  Does hydrotherapy for her knee.  Not currently on abx.   Has taken 6 Diflucan since last office visit.  Has taken boric acid in the past, and this helped the most.   Having vaginal dryness.  Tried coconut oil and this was very messy.  Not sexually active due to dryness and pain.  Stressed about this.   Mother died of metastatic breast cancer.  62 of 7 sisters to mother had breast cancer.  Declines genetic testing.  Hx of benign breast lump.  Has avoided estrogens due to family history.   Uses [facility].   Urine:  Negative  GYNECOLOGIC HISTORY: Patient's last menstrual period was 02/25/1995. Sexually active:  Not currently Partner preference: female Contraception:   postmenopausal Menopausal hormone therapy: none DES exposure:    Blood transfusions: no   Sexually transmitted diseases:  HSV GYN procedures and prior surgeries: Tubal ligation, Left breast biopsy--benign.   Last mammogram:  02-07-14 heterogeneously dense/nl:[facility]               Last pap and high risk HPV testing: 02-15-14 wnl:no HPV testing done   History of abnormal pap smear: no    OB History    Gravida Para Term Preterm AB TAB SAB Ectopic Multiple Living   0                LIFESTYLE: Exercise:               Tobacco:  no Alcohol:    rarely Drug use:  no  Patient Active Problem List   Diagnosis Date Noted  . Degenerative disc disease, cervical 03/22/2014  . Hand pain, right 03/22/2014  . Insomnia, controlled 12/20/2013  . 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 Insomnia secondary to depression with anxiety 10/04/2013  . Chest pain 08/20/2013  . Exertional dyspnea 08/20/2013  . History of rheumatic fever 08/20/2013  . Generalized anxiety disorder 08/09/2013  . Hypomania (mild) single episode or unspecified 08/09/2013  . Insomnia due to mental condition 08/09/2013  . OSA on CPAP 04/06/2013  . Postoperative anemia due to acute blood loss 11/09/2012  . OA (osteoarthritis) of knee 11/08/2012  . History of giardia infection 07/15/2012  . Atrophic vaginitis 07/15/2012  . Vitreous degeneration of right eye   . Nuclear sclerosis   . Right shoulder pain 06/18/2011  . Foot pain, left 06/18/2011  . DJD (degenerative joint disease) of knee 04/29/2011    Past Medical History  Diagnosis Date  . Migraine 10/88    Spillman  . 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 Fibromyalgia 8/98    Truslow  . 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 Plantar fasciitis   . Osteoarthritis 2007    Deveschwar  .  Vitreous degeneration of right eye   . Nuclear sclerosis   . Impaired hearing     left ear  . Scarlet fever as child  . Constipation   . OSA on CPAP 2/07    uses cpap setting of 10  . Pain last week    left under breast pain   . Insomnia     circadian rhythm component  . Depression   . Thyroid disorder   . Insomnia     Past Surgical History  Procedure Laterality Date  . Tonsillectomy and adenoidectomy  1952  . De quervain's release Right 10/97    Sypher  . Breast biopsy Left 6/00    Hardcastle  . 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 Tubal ligation    . Left breast biopsy    . Tonsillectomy  age 28  . Total knee arthroplasty Right 11/08/2012    Procedure: RIGHT TOTAL KNEE ARTHROPLASTY;   Surgeon: Gearlean Alf, MD;  Location: [facility];  Service: Orthopedics;  Laterality: Right;  . Root canal  02/11/2012    Current Outpatient Prescriptions  Medication Sig Dispense Refill  . aspirin EC 81 MG tablet Take 81 mg by mouth daily.    . Aspirin-Acetaminophen-Caffeine (EXCEDRIN MIGRAINE PO) Take by mouth. As needed    . celecoxib (CELEBREX) 200 MG capsule Take 200 mg by mouth daily.    Marland Kitchen doxylamine, Sleep, (UNISOM) 25 MG tablet Take 50 mg by mouth at bedtime as needed.    Marland Kitchen escitalopram (LEXAPRO) 20 MG tablet Take 20 mg by mouth daily.    Marland Kitchen LORazepam (ATIVAN) 1 MG tablet Take 1 mg by mouth 2 (two) times daily.    Marland Kitchen MAGNESIUM PO Take 1 tablet by mouth daily. 2-4 times per day- 150 mg    . thyroid (ARMOUR) 60 MG tablet Take 120-180 mg by mouth daily before breakfast. Takes 125mcg 6days and 111mcg on day 7    . trimethoprim (TRIMPEX) 100 MG tablet Take 1 tablet (100 mg total) by mouth 2 (two) times daily. (Patient taking differently: Take 100 mg by mouth 2 (two) times daily. Takes after intercourse) 90 tablet 0  . valACYclovir (VALTREX) 1000 MG tablet Take 0.5 tablets (500 mg total) by mouth daily. 90 tablet 0   Current Facility-Administered Medications  Medication Dose Route Frequency Provider Last Rate Last Dose  . sodium phosphate (FLEET) 7-19 GM/118ML enema 1 enema  1 enema Rectal Daily PRN Larey Seat, MD         ALLERGIES: Penicillins; Codeine; Provigil; Xyrem; Ciprofloxacin; Monistat; and Terazol  Family History  Problem Relation Age of Onset  . Breast cancer Mother   . Breast cancer Maternal Aunt   . Breast cancer Maternal Aunt   . Breast cancer Maternal Aunt   . Breast cancer Maternal Aunt   . Aneurysm Father   . Migraines Father   . High blood pressure Brother   . Heart disease      Grandmother    History   Social History  . Marital Status: Married    Spouse Name: [name]  . Number of Children: 0  . Years of Education: 14   Occupational History  . Not on  file.   Social History Main Topics  . Smoking status: Passive Smoke Exposure - Never Smoker  . Smokeless tobacco: Never Used  . Alcohol Use: 0.0 oz/week    0 Standard drinks or equivalent per week     Comment: 1 glass - occas.  . Drug  Use: No  . Sexual Activity:    Partners: Male    Birth Control/ Protection: Post-menopausal   Other Topics Concern  . Not on file   Social History Narrative   Patient is married [name]).   Patient drinks 2-4 cups of coffee daily.   Patient is retired.   Patient has two years of college.   Patient is right-handed.                   ROS:  Pertinent items are noted in HPI.  PHYSICAL EXAMINATION:    BP 110/64 mmHg  Pulse 70  Ht 5\' 7"  (1.702 m)  Wt 160 lb 3.2 oz (72.666 kg)  BMI 25.08 kg/m2  LMP 02/25/1995   Wt Readings from Last 3 Encounters:  04/21/14 160 lb 3.2 oz (72.666 kg)  04/18/14 168 lb (76.204 kg)  03/29/14 158 lb (71.668 kg)     Ht Readings from Last 3 Encounters:  04/21/14 5\' 7"  (1.702 m)  04/18/14 5\' 6"  (1.676 m)  03/29/14 5\' 7"  (1.702 m)    General appearance: alert, cooperative and appears stated age   Abdomen: soft, non-tender; no masses,  no organomegaly Back - negative CVA tenderness. Lymph nodes: Cervical, supraclavicular, and axillary nodes normal. No abnormal inguinal nodes palpated Neurologic: Grossly normal  Pelvic: External genitalia:  no lesions              Urethra:  normal appearing urethra with no masses, tenderness or lesions              Bartholins and Skenes: normal                 Vagina: normal appearing vagina with normal color, no lesions.  Lack of rugae.  No discharge noted.              Cervix: normal appearance                 Bimanual Exam:  Uterus:  uterus is normal size, shape, consistency and nontender                                      Adnexa: normal adnexa in size, nontender and no masses                                      ASSESSMENT  Chronic vaginitis.  I suspect strong  component of atrophic vaginitis. Family history of breast cancer.  Back pain.  Probable musculoskeletal. Stress incontinence.   PLAN  Affirm testing.  Urine micro and culture. Comprehensive discussion regarding atrophic vaginitis and local estrogen therapy.  I discussed benefits and risks of DVT, PE, MI, stroke, and breast cancer.  I have asked patient if she would consider doing a 2 month course of local vaginal estrogen and she states yes and would prefer to use a cream.  Will wait for final Affirm result before initiating  this.   25 minutes face to face time of which over 50% was spent in counseling.  An After Visit Summary was printed and given to the patient.

## 2014-04-22 LAB — URINALYSIS, MICROSCOPIC ONLY
Bacteria, UA: NONE SEEN
Casts: NONE SEEN
Crystals: NONE SEEN
Squamous Epithelial / LPF: NONE SEEN

## 2014-04-23 LAB — URINE CULTURE: Colony Count: 10000

## 2014-04-24 ENCOUNTER — Other Ambulatory Visit: Payer: Self-pay | Admitting: Obstetrics and Gynecology

## 2014-04-24 ENCOUNTER — Encounter: Payer: Self-pay | Admitting: Obstetrics and Gynecology

## 2014-04-24 LAB — WET PREP BY MOLECULAR PROBE
Candida species: NEGATIVE
Gardnerella vaginalis: NEGATIVE
Trichomonas vaginosis: NEGATIVE

## 2014-04-24 MED ORDER — ESTRADIOL 0.1 MG/GM VA CREA: TOPICAL_CREAM | VAGINAL | Status: DC

## 2014-05-01 ENCOUNTER — Encounter: Payer: Self-pay | Admitting: Neurology

## 2014-05-05 DIAGNOSIS — H00011 Hordeolum externum right upper eyelid: Secondary | ICD-10-CM | POA: Diagnosis not present

## 2014-05-16 DIAGNOSIS — H6980 Other specified disorders of Eustachian tube, unspecified ear: Secondary | ICD-10-CM | POA: Diagnosis not present

## 2014-05-16 DIAGNOSIS — Z7902 Long term (current) use of antithrombotics/antiplatelets: Secondary | ICD-10-CM | POA: Diagnosis not present

## 2014-05-16 DIAGNOSIS — J329 Chronic sinusitis, unspecified: Secondary | ICD-10-CM | POA: Diagnosis not present

## 2014-05-16 DIAGNOSIS — H6990 Unspecified Eustachian tube disorder, unspecified ear: Secondary | ICD-10-CM | POA: Diagnosis not present

## 2014-05-16 DIAGNOSIS — G4730 Sleep apnea, unspecified: Secondary | ICD-10-CM | POA: Diagnosis not present

## 2014-05-16 DIAGNOSIS — G4733 Obstructive sleep apnea (adult) (pediatric): Secondary | ICD-10-CM | POA: Diagnosis not present

## 2014-05-16 DIAGNOSIS — Z880 Allergy status to penicillin: Secondary | ICD-10-CM | POA: Diagnosis not present

## 2014-05-16 DIAGNOSIS — J3489 Other specified disorders of nose and nasal sinuses: Secondary | ICD-10-CM | POA: Diagnosis not present

## 2014-05-16 DIAGNOSIS — J343 Hypertrophy of nasal turbinates: Secondary | ICD-10-CM | POA: Diagnosis not present

## 2014-05-16 DIAGNOSIS — K219 Gastro-esophageal reflux disease without esophagitis: Secondary | ICD-10-CM | POA: Diagnosis not present

## 2014-05-16 DIAGNOSIS — Z888 Allergy status to other drugs, medicaments and biological substances status: Secondary | ICD-10-CM | POA: Diagnosis not present

## 2014-05-16 DIAGNOSIS — Z7982 Long term (current) use of aspirin: Secondary | ICD-10-CM | POA: Diagnosis not present

## 2014-05-16 DIAGNOSIS — J342 Deviated nasal septum: Secondary | ICD-10-CM | POA: Diagnosis not present

## 2014-05-22 DIAGNOSIS — H0011 Chalazion right upper eyelid: Secondary | ICD-10-CM | POA: Diagnosis not present

## 2014-05-25 DIAGNOSIS — J340 Abscess, furuncle and carbuncle of nose: Secondary | ICD-10-CM | POA: Insufficient documentation

## 2014-06-14 DIAGNOSIS — G441 Vascular headache, not elsewhere classified: Secondary | ICD-10-CM | POA: Diagnosis not present

## 2014-06-14 DIAGNOSIS — M9900 Segmental and somatic dysfunction of head region: Secondary | ICD-10-CM | POA: Diagnosis not present

## 2014-06-14 DIAGNOSIS — M9902 Segmental and somatic dysfunction of thoracic region: Secondary | ICD-10-CM | POA: Diagnosis not present

## 2014-06-14 DIAGNOSIS — M546 Pain in thoracic spine: Secondary | ICD-10-CM | POA: Diagnosis not present

## 2014-06-14 DIAGNOSIS — M9901 Segmental and somatic dysfunction of cervical region: Secondary | ICD-10-CM | POA: Diagnosis not present

## 2014-06-15 DIAGNOSIS — M9900 Segmental and somatic dysfunction of head region: Secondary | ICD-10-CM | POA: Diagnosis not present

## 2014-06-15 DIAGNOSIS — M546 Pain in thoracic spine: Secondary | ICD-10-CM | POA: Diagnosis not present

## 2014-06-15 DIAGNOSIS — M9901 Segmental and somatic dysfunction of cervical region: Secondary | ICD-10-CM | POA: Diagnosis not present

## 2014-06-15 DIAGNOSIS — M9902 Segmental and somatic dysfunction of thoracic region: Secondary | ICD-10-CM | POA: Diagnosis not present

## 2014-06-15 DIAGNOSIS — G441 Vascular headache, not elsewhere classified: Secondary | ICD-10-CM | POA: Diagnosis not present

## 2014-06-19 DIAGNOSIS — G441 Vascular headache, not elsewhere classified: Secondary | ICD-10-CM | POA: Diagnosis not present

## 2014-06-19 DIAGNOSIS — M9901 Segmental and somatic dysfunction of cervical region: Secondary | ICD-10-CM | POA: Diagnosis not present

## 2014-06-19 DIAGNOSIS — M9902 Segmental and somatic dysfunction of thoracic region: Secondary | ICD-10-CM | POA: Diagnosis not present

## 2014-06-19 DIAGNOSIS — M9900 Segmental and somatic dysfunction of head region: Secondary | ICD-10-CM | POA: Diagnosis not present

## 2014-06-19 DIAGNOSIS — M546 Pain in thoracic spine: Secondary | ICD-10-CM | POA: Diagnosis not present

## 2014-06-20 ENCOUNTER — Ambulatory Visit: Payer: Medicare Other | Admitting: Neurology

## 2014-06-21 DIAGNOSIS — M9901 Segmental and somatic dysfunction of cervical region: Secondary | ICD-10-CM | POA: Diagnosis not present

## 2014-06-21 DIAGNOSIS — M9900 Segmental and somatic dysfunction of head region: Secondary | ICD-10-CM | POA: Diagnosis not present

## 2014-06-21 DIAGNOSIS — M546 Pain in thoracic spine: Secondary | ICD-10-CM | POA: Diagnosis not present

## 2014-06-21 DIAGNOSIS — M9902 Segmental and somatic dysfunction of thoracic region: Secondary | ICD-10-CM | POA: Diagnosis not present

## 2014-06-21 DIAGNOSIS — G441 Vascular headache, not elsewhere classified: Secondary | ICD-10-CM | POA: Diagnosis not present

## 2014-07-04 DIAGNOSIS — F331 Major depressive disorder, recurrent, moderate: Secondary | ICD-10-CM | POA: Diagnosis not present

## 2014-07-12 DIAGNOSIS — Z79899 Other long term (current) drug therapy: Secondary | ICD-10-CM | POA: Diagnosis not present

## 2014-07-12 DIAGNOSIS — J329 Chronic sinusitis, unspecified: Secondary | ICD-10-CM | POA: Insufficient documentation

## 2014-07-12 DIAGNOSIS — J3489 Other specified disorders of nose and nasal sinuses: Secondary | ICD-10-CM | POA: Diagnosis not present

## 2014-07-12 DIAGNOSIS — G4733 Obstructive sleep apnea (adult) (pediatric): Secondary | ICD-10-CM | POA: Diagnosis not present

## 2014-07-12 DIAGNOSIS — M542 Cervicalgia: Secondary | ICD-10-CM | POA: Diagnosis not present

## 2014-07-12 DIAGNOSIS — G43909 Migraine, unspecified, not intractable, without status migrainosus: Secondary | ICD-10-CM | POA: Diagnosis not present

## 2014-07-12 DIAGNOSIS — H6980 Other specified disorders of Eustachian tube, unspecified ear: Secondary | ICD-10-CM | POA: Diagnosis not present

## 2014-07-12 DIAGNOSIS — Z974 Presence of external hearing-aid: Secondary | ICD-10-CM | POA: Diagnosis not present

## 2014-07-12 DIAGNOSIS — Z7951 Long term (current) use of inhaled steroids: Secondary | ICD-10-CM | POA: Diagnosis not present

## 2014-07-12 DIAGNOSIS — K219 Gastro-esophageal reflux disease without esophagitis: Secondary | ICD-10-CM | POA: Diagnosis not present

## 2014-07-12 DIAGNOSIS — J343 Hypertrophy of nasal turbinates: Secondary | ICD-10-CM | POA: Diagnosis not present

## 2014-07-12 DIAGNOSIS — R29898 Other symptoms and signs involving the musculoskeletal system: Secondary | ICD-10-CM | POA: Diagnosis not present

## 2014-07-12 DIAGNOSIS — Z9989 Dependence on other enabling machines and devices: Secondary | ICD-10-CM | POA: Diagnosis not present

## 2014-07-12 DIAGNOSIS — J342 Deviated nasal septum: Secondary | ICD-10-CM | POA: Diagnosis not present

## 2014-07-14 ENCOUNTER — Other Ambulatory Visit: Payer: Self-pay

## 2014-07-14 ENCOUNTER — Telehealth: Payer: Self-pay | Admitting: Neurology

## 2014-07-14 DIAGNOSIS — R519 Headache, unspecified: Secondary | ICD-10-CM

## 2014-07-14 DIAGNOSIS — R51 Headache: Principal | ICD-10-CM

## 2014-07-14 NOTE — Telephone Encounter (Signed)
Pt called and requested to speak with Cyril Mourning RN regarding the pt's migraines. The pt has been having migraines for a few months and would like to be advised. Please call and advise.

## 2014-07-14 NOTE — Progress Notes (Signed)
Pt called requesting Dr. Edwena Felty opinion for a migraine/headache specialist within [facility]. Dr. Brett Fairy recommended Dr. Jaynee Eagles.

## 2014-07-14 NOTE — Telephone Encounter (Signed)
Called pt back. Pt states she has been seeing Dr. Elgie Collard for her headaches but that doctor has left and she then saw an ENT at [facility] who referred her to a neurologist within that system but she will be unable to be seen until August. She wants Dr. Edwena Felty opinion on a getting a neurologist at [facility] to see her for headaches. Will speak to Dr. Brett Fairy and let the patient know.

## 2014-07-14 NOTE — Telephone Encounter (Signed)
Spoke to Dr. Brett Fairy and she recommended seeing Dr. Jaynee Eagles for migraines. Called pt back and informed her that someone from our office would be calling her to get that scheduled for her. Pt was very thankful.

## 2014-07-20 ENCOUNTER — Ambulatory Visit (INDEPENDENT_AMBULATORY_CARE_PROVIDER_SITE_OTHER): Payer: Medicare Other | Admitting: Neurology

## 2014-07-20 ENCOUNTER — Encounter: Payer: Self-pay | Admitting: Neurology

## 2014-07-20 VITALS — BP 109/70 | HR 79 | Ht 67.0 in | Wt 175.8 lb

## 2014-07-20 DIAGNOSIS — J0100 Acute maxillary sinusitis, unspecified: Secondary | ICD-10-CM | POA: Diagnosis not present

## 2014-07-20 DIAGNOSIS — G43009 Migraine without aura, not intractable, without status migrainosus: Secondary | ICD-10-CM

## 2014-07-20 MED ORDER — METHYLPREDNISOLONE 4 MG PO TBPK: ORAL_TABLET | ORAL | Status: DC

## 2014-07-20 MED ORDER — SUMATRIPTAN-NAPROXEN SODIUM 85-500 MG PO TABS: 1.0000 | ORAL_TABLET | Freq: Once | ORAL | Status: DC

## 2014-07-20 MED ORDER — ONDANSETRON 4 MG PO TBDP: 4.0000 mg | ORAL_TABLET | Freq: Three times a day (TID) | ORAL | Status: DC | PRN

## 2014-07-20 NOTE — Progress Notes (Signed)
[facility]   Provider:  Dr Jaynee Eagles Referring Provider: Lona Kettle, MD Primary Care Physician:   Melinda Crutch, MD  CC:  Migraine  HPI:  [name] is a 74 y.o. female here as a follow up. history of generalized anxiety disorder, hypomania and insomnia. She started having migraines when she was 21. The migraine is in the forehead. Bilateral. Pounding, sensitive to sound and light. Her vision gets wavy before the headache. She saw an ENT at [facility] and diagnosed her with sinusitis and placed her on antibiotics. She is having 10 a month, increased in the last few months. Previously she only had one migraine a month. She still feels like her sinuses are involved with pressure and pain. She uses a cpap at night and that is affecting her sinuses as well. Her face feels full, head feels full, pressure in the sinuses, can't breath through nostril, ears feel full. She has been traveling extensively within the [place] and overseas. Her ears have been popping for 2 weeks. She last traveled to [place], [place] earlier this month and came back because she was sick.  She has nausea and vomiting. She is currently in the middle of her antibiotic regimen.  Reviewed notes, labs and imaging from outside physicians, which showed: Patient has OSA on CPAP and follows with our sleep doctors. She has a history of mania in response to medications.   Review of Systems: Patient complains of symptoms per HPI as well as the following symptoms: No CP, no SOB. Pertinent negatives per HPI. All others negative.   History   Social History  . Marital Status: Married    Spouse Name: [name]  . Number of Children: 0  . Years of Education: 14   Occupational History  . Not on file.   Social History Main Topics  . Smoking status: Passive Smoke Exposure - Never Smoker  . Smokeless tobacco: Never Used  . Alcohol Use: 0.0 oz/week    0 Standard drinks or equivalent per week     Comment: 1 glass -  occas.  . Drug Use: No  . Sexual Activity:    Partners: Male    Birth Control/ Protection: Post-menopausal   Other Topics Concern  . Not on file   Social History Narrative   Patient is married [name]).   Patient drinks 2-4 cups of coffee daily.   Patient is retired.   Patient has two years of college.   Patient is right-handed.                   Family History  Problem Relation Age of Onset  . Breast cancer Mother   . Breast cancer Maternal Aunt   . Breast cancer Maternal Aunt   . Breast cancer Maternal Aunt   . Breast cancer Maternal Aunt   . Aneurysm Father   . Migraines Father   . High blood pressure Brother   . Heart disease      Grandmother    Past Medical History  Diagnosis Date  . Migraine 10/88    [place]  . 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 Fibromyalgia 8/98    [place]  . 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 Plantar fasciitis   . Osteoarthritis 2007    [place]  . Vitreous degeneration of right eye   . Nuclear sclerosis   . Impaired hearing     left ear  . Scarlet fever as child  . Constipation   .  OSA on CPAP 2/07    uses cpap setting of 10  . Pain last week    left under breast pain   . Insomnia     circadian rhythm component  . Depression   . Thyroid disorder   . Insomnia     Past Surgical History  Procedure Laterality Date  . Tonsillectomy and adenoidectomy  1952  . De quervain's release Right 10/97    [place]  . Breast biopsy Left 6/00    [place]  . 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 Tubal ligation    . Left breast biopsy    . Tonsillectomy  age 18  . Total knee arthroplasty Right 11/08/2012    Procedure: RIGHT TOTAL KNEE ARTHROPLASTY;  Surgeon: Gearlean Alf, MD;  Location: [facility];  Service: Orthopedics;  Laterality: Right;  . Root canal  02/11/2012    Current Outpatient Prescriptions  Medication Sig Dispense Refill  . aspirin EC 81 MG tablet Take 81 mg by mouth daily.    . celecoxib (CELEBREX)  200 MG capsule Take 200 mg by mouth daily.    Marland Kitchen doxylamine, Sleep, (UNISOM) 25 MG tablet Take 50 mg by mouth at bedtime as needed.    Marland Kitchen escitalopram (LEXAPRO) 20 MG tablet Take 20 mg by mouth daily.    Marland Kitchen levofloxacin (LEVAQUIN) 500 MG tablet Take 500 mg by mouth daily.    Marland Kitchen LORazepam (ATIVAN) 1 MG tablet Take 1 mg by mouth 2 (two) times daily.    Marland Kitchen MAGNESIUM PO Take 1 tablet by mouth daily. 2-4 times per day- 150 mg    . Melatonin 3 MG TABS Take 3 mg by mouth daily.    . mupirocin ointment (BACTROBAN) 2 %   0  . thyroid (ARMOUR) 60 MG tablet Take 120-180 mg by mouth daily before breakfast. Takes 180mcg 6days and 157mcg on day 7    . valACYclovir (VALTREX) 1000 MG tablet Take 0.5 tablets (500 mg total) by mouth daily. 90 tablet 0  . Aspirin-Acetaminophen-Caffeine (EXCEDRIN MIGRAINE PO) Take by mouth. As needed    . estradiol (ESTRACE) 0.1 MG/GM vaginal cream Use 1/2 gram vaginally nightly for 2 weeks, then use 1/2 gram vaginally two times per week. (Patient not taking: Reported on 07/20/2014) 42.5 g 0  . trimethoprim (TRIMPEX) 100 MG tablet Take 1 tablet (100 mg total) by mouth 2 (two) times daily. (Patient not taking: Reported on 07/20/2014) 90 tablet 0   Current Facility-Administered Medications  Medication Dose Route Frequency Provider Last Rate Last Dose  . sodium phosphate (FLEET) 7-19 GM/118ML enema 1 enema  1 enema Rectal Daily PRN Larey Seat, MD        Allergies as of 07/20/2014 - Review Complete 07/20/2014  Allergen Reaction Noted  . Penicillins Anaphylaxis 04/29/2011  . Codeine Nausea Only 04/29/2011  . Provigil [modafinil]  03/24/2013  . Seroquel [quetiapine fumarate]  07/20/2014  . Xyrem [sodium oxybate]  06/18/2011  . Ciprofloxacin Rash 07/01/2012  . Monistat [miconazole] Rash 07/01/2012  . Terazol [terconazole] Rash 07/01/2012    Vitals: BP 109/70 mmHg  Pulse 79  Ht 5\' 7"  (1.702 m)  Wt 175 lb 12.8 oz (79.742 kg)  BMI 27.53 kg/m2  LMP 02/25/1995 Last Weight:  Wt  Readings from Last 1 Encounters:  07/20/14 175 lb 12.8 oz (79.742 kg)   Last Height:   Ht Readings from Last 1 Encounters:  07/20/14 5\' 7"  (1.702 m)   Physical exam: Exam: Gen: NAD, conversant, well nourised, well groomed  CV: RRR, no MRG. No Carotid Bruits. No peripheral edema, warm, nontender Eyes: Conjunctivae clear without exudates or hemorrhage  Neuro: Detailed Neurologic Exam  Speech:    Speech is normal; fluent and spontaneous with normal comprehension.  Cognition:    The patient is oriented to person, place, and time;     recent and remote memory intact;     language fluent;     normal attention, concentration,     fund of knowledge Cranial Nerves:    The pupils are equal, round, and reactive to light. The fundi are normal and spontaneous venous pulsations are present. Visual fields are full to finger confrontation. Extraocular movements are intact. Trigeminal sensation is intact and the muscles of mastication are normal. The face is symmetric. The palate elevates in the midline. Hearing intact. Voice is normal. Shoulder shrug is normal. The tongue has normal motion without fasciculations.   Gait:    Heel-toe and tandem gait are normal.   Motor Observation:    No asymmetry, no atrophy, and no involuntary movements noted. Tone:    Normal muscle tone.    Posture:    Posture is normal. normal erect    Strength:    Strength is V/V in the upper and lower limbs.      Assessment/Plan:  74 year old female with migraine exacerbation after sinusitis, currently on antibiotics. Will order a medrol dosepak. Likely secondary to sinusitis. Continue antibiotics. Treximet for acute management. F/u in 2 weeks.  Sarina Ill, MD  [facility] [address] [place], [place] [address]  Phone [phone] Fax [phone]  A total of 30 minutes was spent face-to-face with this patient. Over half this time was spent on  counseling patient on the migraine diagnosis and different diagnostic and therapeutic options available.

## 2014-07-20 NOTE — Patient Instructions (Signed)
Overall you are doing fairly well but I do want to suggest a few things today:   Remember to drink plenty of fluid, eat healthy meals and do not skip any meals. Try to eat protein with a every meal and eat a healthy snack such as fruit or nuts in between meals. Try to keep a regular sleep-wake schedule and try to exercise daily, particularly in the form of walking, 20-30 minutes a day, if you can.   As far as your medications are concerned, I would like to suggest Medrol dosepak treximet at onset of migraine. Can repeat in 2 hours.  zofran at onset of migraine  I would like to see you back in 2 weeks, sooner if we need to. Please call us with any interim questions, concerns, problems, updates or refill requests.   Please also call us for any test results so we can go over those with you on the phone.  My clinical assistant and will answer any of your questions and relay your messages to me and also relay most of my messages to you.   Our phone number is [phone]. We also have an after hours call service for urgent matters and there is a physician on-call for urgent questions. For any emergencies you know to call 911 or go to the nearest emergency room

## 2014-07-25 ENCOUNTER — Telehealth: Payer: Self-pay | Admitting: Neurology

## 2014-07-25 NOTE — Telephone Encounter (Signed)
Marni with [facility] calling to get clarification on directions and quantity for SUMAtriptan-naproxen (TREXIMET) 85-500 MG per tablet. Please call and advise. Janit Pagan can be reached @ [phone] ref# [id number]

## 2014-07-25 NOTE — Telephone Encounter (Signed)
I called back.  Relayed this drug is to be taken prn at onset of migraine and may repeat in 2 hours if needed.  They will proceed with Rx as ordered.

## 2014-07-31 ENCOUNTER — Telehealth: Payer: Self-pay | Admitting: Neurology

## 2014-07-31 NOTE — Telephone Encounter (Signed)
Returned pt's phone call. Pt states that lately she has been very anxious and has had trouble sleeping. She is going to see Dr. Barnabas Harries tomorrow and she said that if he recommends coming in to see Dr. Brett Fairy then she will call and make another appt. She said that Dr. Jaynee Eagles cancelled her appt on Thursday.

## 2014-07-31 NOTE — Telephone Encounter (Signed)
Patient wanted to let Dr. Brett Fairy know that she is having trouble sleeping. She is only sleeping 4 to 5 hours a night.  (She is seeing Dr. Jaynee Eagles on Thurs and wants to know if she could have someone check her CPAP machine at that time?)  Please call back at [phone]

## 2014-07-31 NOTE — Telephone Encounter (Signed)
They can cancel the prescription. I called patient and asked her not to take the treximet. However I think it would be fine, she had a possible reaction at the age of 60 and isn't really sure what happened. Just to be safe, we will not give treximet.  Thanks.

## 2014-07-31 NOTE — Telephone Encounter (Signed)
Marlene from [facility] called wanting to confirm that the patient should be taking SUMAtriptan-naproxen (TREXIMET) 85-500 MG per tablet since she is known to be sensitive to sulfasalazine. Jamas Lav stated that she will put the script on hold until she hears back from our office. Please call and advise. Jamas Lav can be reached @ [phone] ref# [id number]

## 2014-07-31 NOTE — Telephone Encounter (Signed)
I called and spoke with the patient.  Says she is allergic to PCN (anaphyalxsis) and Cipro (rash).  Pharmacy would like to ensure it is okay to dispense Treximet, as it could possiblly contain a sulfonamide structure.  Please advise.  Thank you.  (I will be happy to call the pharmacy back).

## 2014-07-31 NOTE — Telephone Encounter (Signed)
Contacted pharmacy and relayed providers message to cancel Rx.  Ref # [id number]

## 2014-08-01 DIAGNOSIS — F331 Major depressive disorder, recurrent, moderate: Secondary | ICD-10-CM | POA: Diagnosis not present

## 2014-08-03 ENCOUNTER — Ambulatory Visit: Payer: Medicare Other | Admitting: Neurology

## 2014-09-19 ENCOUNTER — Telehealth: Payer: Self-pay | Admitting: Obstetrics and Gynecology

## 2014-09-19 NOTE — Telephone Encounter (Signed)
I have reviewed these updated notes and will see the patient tomorrow.  Encounter closed.

## 2014-09-19 NOTE — Telephone Encounter (Signed)
Spoke with patient. Patient states that she has a history of recurring yeast infections. Is currently on antibiotic for an upper respiratory infection. Is on day 14 and states she has 7 more days to go. Will be going on vacation August 6th. Is experiencing vaginal itching and irritation. Would like to be seen with Dr.Silva for further evaluation of symptoms. Appointment scheduled for tomorrow 09/20/2014 at 1pm with Dr.Silva. Patient is agreeable to date and time.  Routing to provider for final review. Patient agreeable to disposition. Will close encounter.   Patient aware provider will review message and nurse will return call if any additional advice or change of disposition.

## 2014-09-19 NOTE — Telephone Encounter (Signed)
Patient states she has been having an ongoing issue with yeast infections and does not know what to do. She is currently on an antibiotic right now. She only wants to see Dr. Quincy Simmonds. She wants to discuss this matter with the nurse.

## 2014-09-19 NOTE — Telephone Encounter (Addendum)
Patient returned call. She was resting in bed. Immediately patient asks this RN to speak with Dr. Sabra Heck to request that Dr. Sabra Heck order a chest xray for her. Patient states that she has been having nasal congestion and productive cough since May 17 th 2016. She states she has seen her ENT Dr. Wendie Chess at [facility] and he has ordered this for her but that she does not want to go to [facility] to have this done. She states she has contacted Dr Alan Ripper office, her PCP and states that their machine is broken and they cannot do chest xray. Advised patient that I can discuss her request with Dr. Sabra Heck, but that if another provider ordered her chest xray then this is provider she needs to use so that he can get the results and follow up on her care appropriately.  Patient states "I will try another doctor to order it, it's not what I am worried about right now. But I am very hyper about it." Patient reports she is currently on antibiotic for upper respiratory issues.  Advised that I was calling her related to complaint of rectal bleeding.  Patient states she has not experienced this before. Patient reports having one episode of "cramping in stomach" then went to the bathroom and had one episode of "loose stool." When wiping she noted a bright red blood on the tissue. She states "I overreacted and flushed it." Patient states she was unsure if any blood was in the toilet. Patient denies active bleeding at this time. No further cramping in stomach. She reports eating and drinking like usual. No change in diet. Denies dysuria or changes in urination. Denies fevers, but has not taken her temperature.  Denies chest pain or sob. She states she has not been feeling well since she developed the congestion in May. Patient reports she does see Dr. Collene Mares and has no history of hemorrhoids. Does has history of Giardia but this was last year per patient.  Offered to make patient earlier appointment with Dr. Quincy Simmonds tomorrow, she  states that she is "thrilled" with this plan. Declines appointment today at this time and states "I really just need to lay down and calm down. I know I am not dying, but I just don't want to address this problem. I know this sounds crazy." Attempted to reassure patient, advised that we are here to help her if she needs anything at all. Patient expresses appreciation and voices understanding of symptoms that require immediate medical attention.  Patient advised if develops any concerning symptoms, to return call to our office or call 911. Advised if develops active rectal bleeding or any concerns at all to not wait to be seen. Advised would return call with any additional instructions from provider, if necessary.   Routing to Dr. Sabra Heck to review as covering and cc Dr. Quincy Simmonds.  Patient scheduled for 09/20/14 at 1030.

## 2014-09-19 NOTE — Telephone Encounter (Signed)
Message left to return call to [name] at [phone].    

## 2014-09-19 NOTE — Telephone Encounter (Signed)
Patient called earlier today and scheduled an appointment with Dr Quincy Simmonds for tomorrow 09/20/14 for recurrent yeast infections. Patient calledat 130 to confirm this appointment time and report that she just had a bowel movement with blood in it. Patient is now very concerned and anxious and would like to know if there are any available appointments today.

## 2014-09-19 NOTE — Telephone Encounter (Signed)
Reviewed with Dr. Sabra Heck, okay to keep as scheduled.  Patient has been given advise and instructions when to seek emergency care.

## 2014-09-20 ENCOUNTER — Other Ambulatory Visit: Payer: Self-pay | Admitting: Family Medicine

## 2014-09-20 ENCOUNTER — Ambulatory Visit: Payer: Medicare Other | Admitting: Obstetrics and Gynecology

## 2014-09-20 ENCOUNTER — Encounter: Payer: Self-pay | Admitting: Obstetrics and Gynecology

## 2014-09-20 ENCOUNTER — Ambulatory Visit
Admission: RE | Admit: 2014-09-20 | Discharge: 2014-09-20 | Disposition: A | Discharge status: Home / Self Care | Payer: Medicare Other | Source: Ambulatory Visit | Attending: Family Medicine | Admitting: Family Medicine

## 2014-09-20 ENCOUNTER — Ambulatory Visit (INDEPENDENT_AMBULATORY_CARE_PROVIDER_SITE_OTHER): Payer: Medicare Other | Admitting: Obstetrics and Gynecology

## 2014-09-20 VITALS — BP 126/82 | HR 80 | Resp 16 | Wt 188.4 lb

## 2014-09-20 DIAGNOSIS — R053 Chronic cough: Secondary | ICD-10-CM

## 2014-09-20 DIAGNOSIS — N952 Postmenopausal atrophic vaginitis: Secondary | ICD-10-CM | POA: Diagnosis not present

## 2014-09-20 DIAGNOSIS — J329 Chronic sinusitis, unspecified: Secondary | ICD-10-CM

## 2014-09-20 DIAGNOSIS — R3915 Urgency of urination: Secondary | ICD-10-CM | POA: Diagnosis not present

## 2014-09-20 DIAGNOSIS — R5383 Other fatigue: Secondary | ICD-10-CM | POA: Diagnosis not present

## 2014-09-20 DIAGNOSIS — J0100 Acute maxillary sinusitis, unspecified: Secondary | ICD-10-CM | POA: Diagnosis not present

## 2014-09-20 DIAGNOSIS — R05 Cough: Secondary | ICD-10-CM

## 2014-09-20 DIAGNOSIS — N760 Acute vaginitis: Secondary | ICD-10-CM | POA: Diagnosis not present

## 2014-09-20 DIAGNOSIS — K625 Hemorrhage of anus and rectum: Secondary | ICD-10-CM | POA: Diagnosis not present

## 2014-09-20 LAB — POCT URINALYSIS DIPSTICK
Bilirubin, UA: NEGATIVE
Blood, UA: NEGATIVE
Glucose, UA: NEGATIVE
Ketones, UA: NEGATIVE
Leukocytes, UA: NEGATIVE
Nitrite, UA: NEGATIVE
Protein, UA: NEGATIVE
Urobilinogen, UA: NEGATIVE
pH, UA: 5

## 2014-09-20 MED ORDER — ESTRADIOL 0.1 MG/GM VA CREA: TOPICAL_CREAM | VAGINAL | Status: DC

## 2014-09-20 NOTE — Progress Notes (Signed)
GYNECOLOGY  VISIT   HPI: 74 y.o.   Married  Caucasian  female   G0P0 with Patient's last menstrual period was 02/25/1995 (approximate).   here for   Reccurring yeast infections Has done multiple treatments.  States the Diflucan works well but needs to be name brand.  Taking probiotics.   Currently on Levaquin for sinusitis.  Has done three 14 day courses of antibiotics. Has taken prednisone off and on for March.  Seeing PCP at [facility] today and wanting to do an x-ray of chest.  Worried about mononucleosis.  Wants to see a pulmonologist.  Has Rx for vaginal estrogen cream.  Used it for three - four months and symptoms improved.  Did not have itching or burning.  Wants refill of Estrace.  Has not had intercourse.   Family history of breast cancer and has been hesitant to use estrogen cream.   Some urinary urgency.   Also bright red rectal bleeding yesterday only. When she has fecal urgency, has vomiting at the same time.  No known diagnosis of hemorrhoids.  Dr. Collene Mares is her GI. Husband sees Dr. Earlean Shawl.   Asking to see me for her annual exam when she is due.   Urine dip today - negative.  GYNECOLOGIC HISTORY: Patient's last menstrual period was 02/25/1995 (approximate). Contraception: n/a Menopausal hormone therapy: Estrace  Last mammogram: 02/08/15 dense Bi-rads C 1 neg. Last pap smear: 02/15/14 wnl         OB History    Gravida Para Term Preterm AB TAB SAB Ectopic Multiple Living   0                  Patient Active Problem List   Diagnosis Date Noted  . Degenerative disc disease, cervical 03/22/2014  . Hand pain, right 03/22/2014  . Insomnia, controlled 12/20/2013  . 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 Insomnia secondary to depression with anxiety 10/04/2013  . Chest pain 08/20/2013  . Exertional dyspnea 08/20/2013  . History of rheumatic fever 08/20/2013  . Generalized anxiety disorder 08/09/2013  . Hypomania (mild) single episode or  unspecified 08/09/2013  . Insomnia due to mental condition 08/09/2013  . OSA on CPAP 04/06/2013  . Postoperative anemia due to acute blood loss 11/09/2012  . OA (osteoarthritis) of knee 11/08/2012  . History of giardia infection 07/15/2012  . Atrophic vaginitis 07/15/2012  . Vitreous degeneration of right eye   . Nuclear sclerosis   . Right shoulder pain 06/18/2011  . Foot pain, left 06/18/2011  . DJD (degenerative joint disease) of knee 04/29/2011    Past Medical History  Diagnosis Date  . Migraine 10/88    Spillman  . 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 Fibromyalgia 8/98    Truslow  . 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 Plantar fasciitis   . Osteoarthritis 2007    Deveschwar  . Vitreous degeneration of right eye   . Nuclear sclerosis   . Impaired hearing     left ear  . Scarlet fever as child  . Constipation   . OSA on CPAP 2/07    uses cpap setting of 10  . Pain last week    left under breast pain   . Insomnia     circadian rhythm component  . Depression   . Thyroid disorder   . Insomnia     Past Surgical History  Procedure Laterality Date  . Tonsillectomy and adenoidectomy  1952  . De quervain's  release Right 10/97    Sypher  . Breast biopsy Left 6/00    Hardcastle  . 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 Tubal ligation    . Left breast biopsy    . Tonsillectomy  age 74  . Total knee arthroplasty Right 11/08/2012    Procedure: RIGHT TOTAL KNEE ARTHROPLASTY;  Surgeon: Gearlean Alf, MD;  Location: [facility];  Service: Orthopedics;  Laterality: Right;  . Root canal  02/11/2012    Current Outpatient Prescriptions  Medication Sig Dispense Refill  . aspirin EC 81 MG tablet Take 81 mg by mouth daily.    . Aspirin-Acetaminophen-Caffeine (EXCEDRIN MIGRAINE PO) Take by mouth. As needed    . celecoxib (CELEBREX) 200 MG capsule Take 200 mg by mouth daily.    Marland Kitchen doxylamine, Sleep, (UNISOM) 25 MG tablet Take 50 mg by mouth at bedtime as needed.    Marland Kitchen  escitalopram (LEXAPRO) 20 MG tablet Take 20 mg by mouth daily.    Marland Kitchen estradiol (ESTRACE) 0.1 MG/GM vaginal cream Use 1/2 gram vaginally nightly for 2 weeks, then use 1/2 gram vaginally two times per week. 42.5 g 0  . levofloxacin (LEVAQUIN) 500 MG tablet Take 500 mg by mouth daily.    Marland Kitchen LORazepam (ATIVAN) 1 MG tablet Take 1 mg by mouth 2 (two) times daily.    Marland Kitchen MAGNESIUM PO Take 1 tablet by mouth daily. 2-4 times per day- 150 mg    . Melatonin 3 MG TABS Take 3 mg by mouth daily.    . methylPREDNISolone (MEDROL DOSEPAK) 4 MG TBPK tablet follow package directions 21 tablet 1  . ondansetron (ZOFRAN-ODT) 4 MG disintegrating tablet Take 1 tablet (4 mg total) by mouth every 8 (eight) hours as needed for nausea. 30 tablet 3  . SUMAtriptan-naproxen (TREXIMET) 85-500 MG per tablet Take 1 tablet by mouth once. 10 tablet 6  . thyroid (ARMOUR) 60 MG tablet Take 120-180 mg by mouth daily before breakfast. Takes 142mcg 6days and 161mcg on day 7    . trimethoprim (TRIMPEX) 100 MG tablet Take 1 tablet (100 mg total) by mouth 2 (two) times daily. 90 tablet 0  . valACYclovir (VALTREX) 1000 MG tablet Take 0.5 tablets (500 mg total) by mouth daily. 90 tablet 0  . mupirocin ointment (BACTROBAN) 2 %   0   Current Facility-Administered Medications  Medication Dose Route Frequency Provider Last Rate Last Dose  . sodium phosphate (FLEET) 7-19 GM/118ML enema 1 enema  1 enema Rectal Daily PRN Larey Seat, MD         ALLERGIES: Penicillins; Codeine; Provigil; Seroquel; Xyrem; Ciprofloxacin; Monistat; and Terazol  Family History  Problem Relation Age of Onset  . Breast cancer Mother   . Breast cancer Maternal Aunt   . Breast cancer Maternal Aunt   . Breast cancer Maternal Aunt   . Breast cancer Maternal Aunt   . Aneurysm Father   . Migraines Father   . High blood pressure Brother   . Heart disease      Grandmother    History   Social History  . Marital Status: Married    Spouse Name: [name]  . Number of  Children: 0  . Years of Education: 14   Occupational History  . Not on file.   Social History Main Topics  . Smoking status: Passive Smoke Exposure - Never Smoker  . Smokeless tobacco: Never Used  . Alcohol Use: 0.0 oz/week    0 Standard drinks or equivalent per week  Comment: 1 glass - occas.  . Drug Use: No  . Sexual Activity:    Partners: Male    Birth Control/ Protection: Post-menopausal   Other Topics Concern  . Not on file   Social History Narrative   Patient is married [name]).   Patient drinks 2-4 cups of coffee daily.   Patient is retired.   Patient has two years of college.   Patient is right-handed.                   ROS:  Pertinent items are noted in HPI.  PHYSICAL EXAMINATION:    BP 126/82 mmHg  Pulse 80  Resp 16  Wt 188 lb 6.4 oz (85.458 kg)  LMP 02/25/1995 (Approximate)    General appearance: alert, cooperative and appears stated age Inguinal nodes:  Normal. Pelvic: External genitalia:  no lesions              Urethra:  normal appearing urethra with no masses, tenderness or lesions              Bartholins and Skenes: normal                 Vagina: normal appearing vagina with normal color and discharge, no lesions              Cervix: no lesions       Bimanual Exam:  Uterus:  normal size, contour, position, consistency, mobility, non-tender              Adnexa: no mass, fullness, tenderness              Rectum/anus:  External hemorrhoid noted.  Chaperone was present for exam.  ASSESSMENT  Recent sinusitis. Hx recurrent vaginitis.  Atrophic vaginitis.  Urinary urgency.  Urine dip negative.  Rectal bleeding.   Fatigue.   PLAN  Counseled regarding recurrent vaginitis.  Affirm testing.  Will await results for treatment.  Refill of Estrace vaginal cream.  Patient needs to follow up with her GI regarding her rectal bleeding and will do so.  We discussed the importance of doing this. Patient declines anoscopy with me today.  Patient  will see her PCP today for her respiratory symptoms and fatigue.  She will discuss with them the potential referral to pulmonology. Annual exam in January 2017.  An After Visit Summary was printed and given to the patient.  __25____ minutes face to face time of which over 50% was spent in counseling.

## 2014-09-21 LAB — WET PREP BY MOLECULAR PROBE
Candida species: NEGATIVE
Gardnerella vaginalis: NEGATIVE
Trichomonas vaginosis: NEGATIVE

## 2014-09-22 ENCOUNTER — Other Ambulatory Visit: Payer: Self-pay | Admitting: Obstetrics and Gynecology

## 2014-09-22 MED ORDER — NYSTATIN-TRIAMCINOLONE 100000-0.1 UNIT/GM-% EX CREA: 1.0000 "application " | TOPICAL_CREAM | Freq: Two times a day (BID) | CUTANEOUS | Status: DC

## 2014-09-22 MED ORDER — ESTRADIOL 0.1 MG/GM VA CREA: TOPICAL_CREAM | VAGINAL | Status: DC

## 2014-09-28 DIAGNOSIS — M542 Cervicalgia: Secondary | ICD-10-CM | POA: Diagnosis not present

## 2014-09-28 DIAGNOSIS — H6990 Unspecified Eustachian tube disorder, unspecified ear: Secondary | ICD-10-CM | POA: Diagnosis not present

## 2014-09-28 DIAGNOSIS — J329 Chronic sinusitis, unspecified: Secondary | ICD-10-CM | POA: Diagnosis not present

## 2014-09-28 DIAGNOSIS — J309 Allergic rhinitis, unspecified: Secondary | ICD-10-CM | POA: Diagnosis not present

## 2014-09-28 DIAGNOSIS — Z888 Allergy status to other drugs, medicaments and biological substances status: Secondary | ICD-10-CM | POA: Diagnosis not present

## 2014-09-28 DIAGNOSIS — G43909 Migraine, unspecified, not intractable, without status migrainosus: Secondary | ICD-10-CM | POA: Diagnosis not present

## 2014-09-28 DIAGNOSIS — G4733 Obstructive sleep apnea (adult) (pediatric): Secondary | ICD-10-CM | POA: Diagnosis not present

## 2014-09-28 DIAGNOSIS — Z79899 Other long term (current) drug therapy: Secondary | ICD-10-CM | POA: Diagnosis not present

## 2014-09-28 DIAGNOSIS — Z7951 Long term (current) use of inhaled steroids: Secondary | ICD-10-CM | POA: Diagnosis not present

## 2014-09-28 DIAGNOSIS — J343 Hypertrophy of nasal turbinates: Secondary | ICD-10-CM | POA: Diagnosis not present

## 2014-09-28 DIAGNOSIS — J342 Deviated nasal septum: Secondary | ICD-10-CM | POA: Diagnosis not present

## 2014-09-28 DIAGNOSIS — J320 Chronic maxillary sinusitis: Secondary | ICD-10-CM | POA: Diagnosis not present

## 2014-09-28 DIAGNOSIS — J3489 Other specified disorders of nose and nasal sinuses: Secondary | ICD-10-CM | POA: Diagnosis not present

## 2014-09-28 DIAGNOSIS — Z880 Allergy status to penicillin: Secondary | ICD-10-CM | POA: Diagnosis not present

## 2014-10-01 ENCOUNTER — Other Ambulatory Visit: Payer: Self-pay | Admitting: Certified Nurse Midwife

## 2014-10-02 NOTE — Telephone Encounter (Signed)
Medication refill request: Diflucan Last AEX:  02/15/14 with SM Next AEX: 04/13/15 with SM Patient was last seen by BS on 09/20/14 for recurrent yeast infections.  Refill authorized: Please advise.

## 2014-10-10 DIAGNOSIS — F331 Major depressive disorder, recurrent, moderate: Secondary | ICD-10-CM | POA: Diagnosis not present

## 2014-10-13 DIAGNOSIS — A499 Bacterial infection, unspecified: Secondary | ICD-10-CM | POA: Diagnosis not present

## 2014-10-16 ENCOUNTER — Institutional Professional Consult (permissible substitution): Payer: Self-pay | Admitting: Pulmonary Disease

## 2014-10-23 DIAGNOSIS — J329 Chronic sinusitis, unspecified: Secondary | ICD-10-CM | POA: Diagnosis not present

## 2014-10-23 DIAGNOSIS — J300 Vasomotor rhinitis: Secondary | ICD-10-CM | POA: Diagnosis not present

## 2014-10-24 DIAGNOSIS — G4739 Other sleep apnea: Secondary | ICD-10-CM | POA: Diagnosis not present

## 2014-10-24 DIAGNOSIS — Z6832 Body mass index (BMI) 32.0-32.9, adult: Secondary | ICD-10-CM | POA: Diagnosis not present

## 2014-10-24 DIAGNOSIS — J019 Acute sinusitis, unspecified: Secondary | ICD-10-CM | POA: Diagnosis not present

## 2014-10-24 DIAGNOSIS — R635 Abnormal weight gain: Secondary | ICD-10-CM | POA: Diagnosis not present

## 2014-10-24 DIAGNOSIS — E038 Other specified hypothyroidism: Secondary | ICD-10-CM | POA: Diagnosis not present

## 2014-10-24 DIAGNOSIS — G43909 Migraine, unspecified, not intractable, without status migrainosus: Secondary | ICD-10-CM | POA: Diagnosis not present

## 2014-10-24 DIAGNOSIS — M859 Disorder of bone density and structure, unspecified: Secondary | ICD-10-CM | POA: Diagnosis not present

## 2014-10-24 DIAGNOSIS — Z1389 Encounter for screening for other disorder: Secondary | ICD-10-CM | POA: Diagnosis not present

## 2014-10-24 DIAGNOSIS — E559 Vitamin D deficiency, unspecified: Secondary | ICD-10-CM | POA: Diagnosis not present

## 2014-10-24 DIAGNOSIS — E785 Hyperlipidemia, unspecified: Secondary | ICD-10-CM | POA: Diagnosis not present

## 2014-10-24 DIAGNOSIS — M797 Fibromyalgia: Secondary | ICD-10-CM | POA: Diagnosis not present

## 2014-10-25 ENCOUNTER — Ambulatory Visit: Payer: Medicare Other | Admitting: Neurology

## 2014-11-06 DIAGNOSIS — H2513 Age-related nuclear cataract, bilateral: Secondary | ICD-10-CM | POA: Diagnosis not present

## 2014-11-06 DIAGNOSIS — H04122 Dry eye syndrome of left lacrimal gland: Secondary | ICD-10-CM | POA: Diagnosis not present

## 2014-11-06 DIAGNOSIS — H04121 Dry eye syndrome of right lacrimal gland: Secondary | ICD-10-CM | POA: Diagnosis not present

## 2014-11-06 DIAGNOSIS — H5203 Hypermetropia, bilateral: Secondary | ICD-10-CM | POA: Diagnosis not present

## 2014-12-04 DIAGNOSIS — M79676 Pain in unspecified toe(s): Secondary | ICD-10-CM | POA: Diagnosis not present

## 2014-12-04 DIAGNOSIS — E559 Vitamin D deficiency, unspecified: Secondary | ICD-10-CM | POA: Diagnosis not present

## 2014-12-05 ENCOUNTER — Telehealth: Payer: Self-pay | Admitting: Obstetrics & Gynecology

## 2014-12-05 DIAGNOSIS — K648 Other hemorrhoids: Secondary | ICD-10-CM

## 2014-12-05 NOTE — Telephone Encounter (Signed)
Patient was referred to Dr Dellis Filbert [facility] office by Dr lathrop and did not schedule. She's having bleeding from her rectum and would like another referral to his office. Chart to triage.

## 2014-12-05 NOTE — Telephone Encounter (Signed)
Patient says she is returning call. Ok to call on cell number [phone].

## 2014-12-06 NOTE — Telephone Encounter (Signed)
Referral placed for Dr. Earlean Shawl.  Routing to [employer] for referral processing and patient contact.   Patient notified that referral was placed and she will be contacted by our referrals coordinator or Dr. Oswald Hillock office with appointment. Patient agreeable.  Patient wishes to move appointment for annual exam with Dr. Sabra Heck prior to the end of the year if possible as she has met her deductible. Annual exam scheduled for 01/23/15 with Dr. Sabra Heck.

## 2014-12-06 NOTE — Telephone Encounter (Signed)
Spoke with patient. She denies active rectal bleeding. She states she has hemorrhoids and would like a referral to Dr. Liliane Channel office to request evaluation and treatment. She states this is "not new and has been going on for years, I just didn't want to deal with it before." Advised will send message to Dr. Quincy Simmonds for review and authorization for referral. Patient agreeable.

## 2014-12-06 NOTE — Telephone Encounter (Signed)
OK for referral to Dr. Earlean Shawl for hemorrhoids.

## 2014-12-06 NOTE — Telephone Encounter (Signed)
Patient says she is returning a call to [place]. No open tc note? Please call home number [phone].

## 2014-12-07 DIAGNOSIS — L03011 Cellulitis of right finger: Secondary | ICD-10-CM | POA: Diagnosis not present

## 2014-12-11 ENCOUNTER — Ambulatory Visit (INDEPENDENT_AMBULATORY_CARE_PROVIDER_SITE_OTHER): Payer: Medicare Other | Admitting: Neurology

## 2014-12-11 ENCOUNTER — Encounter: Payer: Self-pay | Admitting: Neurology

## 2014-12-11 VITALS — BP 102/68 | HR 78 | Resp 20 | Ht 66.5 in | Wt 178.0 lb

## 2014-12-11 DIAGNOSIS — Z9989 Dependence on other enabling machines and devices: Principal | ICD-10-CM

## 2014-12-11 DIAGNOSIS — G4733 Obstructive sleep apnea (adult) (pediatric): Secondary | ICD-10-CM

## 2014-12-11 DIAGNOSIS — J0120 Acute ethmoidal sinusitis, unspecified: Secondary | ICD-10-CM | POA: Diagnosis not present

## 2014-12-11 NOTE — Progress Notes (Signed)
PATIENT: [name] DOB: [birth year]  REASON FOR VISIT: follow up for hypomania and insomnia, UARS.  HISTORY FROM: patient  HISTORY OF PRESENT ILLNESS: [name] is a 74 year old female with a history of generalized anxiety disorder, hypomania and insomnia.    Last note by MM. 2015  Patient was given medication called "PURE" for psychosis and that sent her into a manic episode. She was purchasing 2000 dollars worth of clothing. She was very agitated and aggressive.  She was also having some hallucinations.She has stopped that medicine and was started on Risperdal.  She is now a patient of Dr. Casimiro Needle, who  took her off Risperdal and put her clonazepam in the morning and lorazepam at night.  She states that has been working well, with the exception that she felt tired. Patient is no longer agitated and aggressive nor having hallucination. No new medical issues since last seen. Patient states that she had 3 appointments with Dr. Casimiro Needle.  She is very happy with his care. She has been having new difficulties sleeping after a trip to [place], returned on 11-29-13, this is now alleviated with the use of Unisom, a safe and non addictive medication.She uses black out curtains in her bedroom, her husband reads on a kindle, no TV, cool temperatures.The couple sleeps in the same bedroom.   Interval history from 12-11-14, [name] had a rough spring this year after traveling she contracted a sinus infection that she could not get rid off. She was treated 3 times with antibiotics and concomitantly use decongestants. She was also placed on prednisone. She saw my colleague Dr. Jaynee Eagles when she suffered from migraines. Dr. Jaynee Eagles felt that this was all sinusitis related and the headaches had resolved once the sinusitis was controlled. The me today for a compliance visit on her CPAP. She endorsed today the fatigue severity score at 44 points and the Epworth sleepiness score at only 1 points. She has 100%  compliance over the last 30 days of CPAP use the machine is set at 10 cm water pressure with out EPR average user time is 8 hours and 50 minutes with a residual AHI of 2.4. There is a mild to moderate air leak on some nights seen. There should be no adjustments made to her current settings. During our visit today she saw the [employer] for the dreams station and dream wear mask, which is travel friendly.    REVIEW OF SYSTEMS: Full 14 system review of systems performed and notable only for:    04-18-14: Epworth 1 , GDS 2 ,  FSS 44  Constitutional: Fatigue, but improved since last visit.  Eyes: Light sensitivity Gastrointestinal: Constipation  Endocrine: He in tolerance, excessive thirst, excessive eating Musculoskeletal: Joint pain, joint swelling, aching muscles, muscle cramps, neck pain, neck stiffness Neurological: Headache, not acute- mainly cervicalgia. Sinuitis. She has a sinus infection after travelling to [facility] spring 2016.  Severe migrainous headaches followed.    ALLERGIES: Allergies  Allergen Reactions  . Penicillins Anaphylaxis    Tongue swelling  . Codeine Nausea Only  . Provigil [Modafinil]   . Seroquel [Quetiapine Fumarate]   . Xyrem [Sodium Oxybate]     hallucination  . Ciprofloxacin Rash  . Monistat [Miconazole] Rash  . Terazol [Terconazole] Rash    HOME MEDICATIONS: Outpatient Prescriptions Prior to Visit  Medication Sig Dispense Refill  . aspirin EC 81 MG tablet Take 81 mg by mouth daily.    . Aspirin-Acetaminophen-Caffeine (EXCEDRIN MIGRAINE PO)  Take by mouth. As needed    . celecoxib (CELEBREX) 200 MG capsule Take 200 mg by mouth daily.    Marland Kitchen DIFLUCAN 150 MG tablet TAKE 1 TABLET. REPEAT ONE  IN 5 DAYS. 2 tablet 1  . doxylamine, Sleep, (UNISOM) 25 MG tablet Take 50 mg by mouth at bedtime as needed.    Marland Kitchen escitalopram (LEXAPRO) 20 MG tablet Take 20 mg by mouth daily.    Marland Kitchen estradiol (ESTRACE) 0.1 MG/GM vaginal cream Use 1/2 gram vaginally  nightly for 2 weeks, then use 1/2 gram vaginally two times per week. 42.5 g 1  . levofloxacin (LEVAQUIN) 500 MG tablet Take 500 mg by mouth daily.    Marland Kitchen LORazepam (ATIVAN) 1 MG tablet Take 2 mg by mouth daily.     Marland Kitchen MAGNESIUM PO Take 1 tablet by mouth daily. 2-4 times per day- 150 mg    . Melatonin 3 MG TABS Take 5 mg by mouth daily.     Marland Kitchen nystatin-triamcinolone (MYCOLOG II) cream Apply 1 application topically 2 (two) times daily. Apply to affected area BID for up to 7 days. 60 g 0  . thyroid (ARMOUR) 60 MG tablet Take 120-180 mg by mouth daily before breakfast. Takes 170mcg 6days and 160mcg on day 7    . trimethoprim (TRIMPEX) 100 MG tablet Take 1 tablet (100 mg total) by mouth 2 (two) times daily. 90 tablet 0  . valACYclovir (VALTREX) 1000 MG tablet Take 0.5 tablets (500 mg total) by mouth daily. 90 tablet 0  . methylPREDNISolone (MEDROL DOSEPAK) 4 MG TBPK tablet follow package directions 21 tablet 1  . mupirocin ointment (BACTROBAN) 2 %   0  . ondansetron (ZOFRAN-ODT) 4 MG disintegrating tablet Take 1 tablet (4 mg total) by mouth every 8 (eight) hours as needed for nausea. 30 tablet 3  . SUMAtriptan-naproxen (TREXIMET) 85-500 MG per tablet Take 1 tablet by mouth once. 10 tablet 6   Facility-Administered Medications Prior to Visit  Medication Dose Route Frequency Provider Last Rate Last Dose  . sodium phosphate (FLEET) 7-19 GM/118ML enema 1 enema  1 enema Rectal Daily PRN Larey Seat, MD        PAST MEDICAL HISTORY: Past Medical History  Diagnosis Date  . Migraine 10/88    Spillman  . 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 Fibromyalgia 8/98    Truslow  . 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 Plantar fasciitis   . Osteoarthritis 2007    Deveschwar  . Vitreous degeneration of right eye   . Nuclear sclerosis   . Impaired hearing     left ear  . Scarlet fever as child  . Constipation   . OSA on CPAP 2/07    uses cpap setting of 10  . Pain last week    left under  breast pain   . Insomnia     circadian rhythm component  . Depression   . Thyroid disorder   . Insomnia     PAST SURGICAL HISTORY: Past Surgical History  Procedure Laterality Date  . Tonsillectomy and adenoidectomy  1952  . De quervain's release Right 10/97    Sypher  . Breast biopsy Left 6/00    Hardcastle  . 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 Tubal ligation    . Left breast biopsy    . Tonsillectomy  age 61  . Total knee arthroplasty Right 11/08/2012    Procedure: RIGHT TOTAL KNEE ARTHROPLASTY;  Surgeon: Gearlean Alf, MD;  Location: [facility];  Service: Orthopedics;  Laterality: Right;  . Root canal  02/11/2012    FAMILY HISTORY: Family History  Problem Relation Age of Onset  . Breast cancer Mother   . Breast cancer Maternal Aunt   . Breast cancer Maternal Aunt   . Breast cancer Maternal Aunt   . Breast cancer Maternal Aunt   . Aneurysm Father   . Migraines Father   . High blood pressure Brother   . Heart disease      Grandmother    SOCIAL HISTORY: Social History   Social History  . Marital Status: Married    Spouse Name: [name]  . Number of Children: 0  . Years of Education: 14   Occupational History  . Not on file.   Social History Main Topics  . Smoking status: Passive Smoke Exposure - Never Smoker  . Smokeless tobacco: Never Used  . Alcohol Use: 0.0 oz/week    0 Standard drinks or equivalent per week     Comment: 1 glass - occas.  . Drug Use: No  . Sexual Activity:    Partners: Male    Birth Control/ Protection: Post-menopausal   Other Topics Concern  . Not on file   Social History Narrative   Patient is married [name]).   Patient drinks 2-4 cups of coffee daily.   Patient is retired.   Patient has two years of college.   Patient is right-handed.                     PHYSICAL EXAM  Filed Vitals:   12/11/14 1343  BP: 102/68  Pulse: 78  Resp: 20  Height: 5' 6.5" (1.689 m)  Weight: 178 lb (80.74 kg)   Body mass index is 28.3  kg/(m^2).  Generalized: Well developed, in no acute distress   Neck circumference 14.5 inches, Mallompatti 2-3, lost weight 185 pounds today , from 202 pounds.    Neurological examination  Mentation: Alert oriented to time, place, history taking. Follows all commands speech and language fluent Cranial nerve - no change in smell or taste - Extraocular movements were full, visual field were full on confrontational test.  Motor: The motor testing reveals 5 over 5 strength of all 4 extremities. Good symmetric motor tone is noted throughout.  Sensory: Sensory testing is intact to soft touch on all 4 extremities. No evidence of extinction is noted.  Coordination: Cerebellar testing reveals good finger-nose-finger and heel-to-shin bilaterally.  Gait and station: Gait is normal. Tandem gait is normal. Romberg is negative. No drift is seen.  Reflexes: Deep tendon reflexes are symmetric and normal bilaterally.   DIAGNOSTIC DATA (LABS, IMAGING, TESTING) - I reviewed patient records, labs, notes, testing and imaging myself where available. I reviewed Dr Ferdinand Lango notes and the CPAP download.   ASSESSMENT AND PLAN:  1. Mild apnea with UARS ( upper airway resistancy syndrome , on CPAP since 2007)  Well treated on only 10 cm water .  2. Sinusitis mediated headache with migrainous overlay  3. Insomnia, cyclic resolved, chronic - present. She is very sensitive to time changes. She will travel to [place] in December.  First advise to use Ativan 1 tab at bedtime and another tablet in middle of the night.   She can continue with prn Unisom. She has Insomnia caused after the jet lag from her [place] trip.  Patient was seen by Dr. Casimiro Needle, who recommended follow up with me. Marland Kitchen  He maintains her on clonazepam in  the morning and lorazepam in the evening.  She feels that she is doing well with this.  The patient will follow-up  For 15 minutes in 12 month with me, Dr. Asencion Partridge Esvin Hnat.    Larey Seat , MD     12/11/2014, 2:12 PM    [facility] [address], [place],  [address] [phone]

## 2014-12-12 ENCOUNTER — Encounter: Payer: Self-pay | Admitting: Obstetrics and Gynecology

## 2014-12-12 ENCOUNTER — Ambulatory Visit (INDEPENDENT_AMBULATORY_CARE_PROVIDER_SITE_OTHER): Payer: Medicare Other | Admitting: Obstetrics and Gynecology

## 2014-12-12 VITALS — BP 112/64 | HR 88 | Resp 14 | Wt 179.0 lb

## 2014-12-12 DIAGNOSIS — R350 Frequency of micturition: Secondary | ICD-10-CM

## 2014-12-12 DIAGNOSIS — L293 Anogenital pruritus, unspecified: Secondary | ICD-10-CM | POA: Diagnosis not present

## 2014-12-12 DIAGNOSIS — N760 Acute vaginitis: Secondary | ICD-10-CM | POA: Diagnosis not present

## 2014-12-12 DIAGNOSIS — R631 Polydipsia: Secondary | ICD-10-CM

## 2014-12-12 LAB — POCT URINALYSIS DIPSTICK
Bilirubin, UA: NEGATIVE
Blood, UA: NEGATIVE
Glucose, UA: NEGATIVE
Ketones, UA: NEGATIVE
Leukocytes, UA: NEGATIVE
Nitrite, UA: NEGATIVE
Protein, UA: NEGATIVE
Urobilinogen, UA: NEGATIVE
pH, UA: 7

## 2014-12-12 MED ORDER — BETAMETHASONE VALERATE 0.1 % EX OINT: TOPICAL_OINTMENT | CUTANEOUS | Status: DC

## 2014-12-12 NOTE — Progress Notes (Signed)
Patient ID: [name], female   DOB: 04-10-1940, 74 y.o.   MRN: [id number] GYNECOLOGY  VISIT   HPI: 74 y.o.   Married  Caucasian  female   G0P0 with Patient's last menstrual period was 02/25/1995 (approximate).   here c/o vaginal itching that started last night. She c/o a h/o frequent yeast infections. Last check in 7/16 was negative for yeast. She was recently put on cephalexin for a skin infection, then got a call yesterday and was told she had a ? fungal infection. She was told she would need blood work prior to starting treatment and that the treatment could cause liver damage. She is very worried and asks if I would look at the test result for her.  She c/o vulvar itching starting last night, more on the right side. No abnormal vaginal d/c. Not using vaginal estrogen cream any more.  She c/o increase in urinary frequency since last night, normal amounts. No urgency or dysuria.  Since May she has been on 3 rounds of prednisone and 4 rounds of antibiotics. Treated for sinusitis every time. She still has some blockage. She is following up with ENT this Thursday.  GYNECOLOGIC HISTORY: Patient's last menstrual period was 02/25/1995 (approximate). Contraception:tubal ligation/ postmenopause  Menopausal hormone therapy: Estradiol         OB History    Gravida Para Term Preterm AB TAB SAB Ectopic Multiple Living   0                  Patient Active Problem List   Diagnosis Date Noted  . Subacute ethmoidal sinusitis 12/11/2014  . Degenerative disc disease, cervical 03/22/2014  . Hand pain, right 03/22/2014  . Insomnia, controlled 12/20/2013  . 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 Insomnia secondary to depression with anxiety 10/04/2013  . Chest pain 08/20/2013  . Exertional dyspnea 08/20/2013  . History of rheumatic fever 08/20/2013  . Generalized anxiety disorder 08/09/2013  . Hypomania (mild) single episode or unspecified 08/09/2013  . Insomnia due to mental  condition 08/09/2013  . OSA on CPAP 04/06/2013  . Postoperative anemia due to acute blood loss 11/09/2012  . OA (osteoarthritis) of knee 11/08/2012  . History of giardia infection 07/15/2012  . Atrophic vaginitis 07/15/2012  . Vitreous degeneration of right eye   . Nuclear sclerosis   . Right shoulder pain 06/18/2011  . Foot pain, left 06/18/2011  . DJD (degenerative joint disease) of knee 04/29/2011    Past Medical History  Diagnosis Date  . Migraine 10/88    Spillman  . 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 Fibromyalgia 8/98    Truslow  . 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 Plantar fasciitis   . Osteoarthritis 2007    Deveschwar  . Vitreous degeneration of right eye   . Nuclear sclerosis   . Impaired hearing     left ear  . Scarlet fever as child  . Constipation   . OSA on CPAP 2/07    uses cpap setting of 10  . Pain last week    left under breast pain   . Insomnia     circadian rhythm component  . Depression   . Thyroid disorder   . Insomnia     Past Surgical History  Procedure Laterality Date  . Tonsillectomy and adenoidectomy  1952  . De quervain's release Right 10/97    Sypher  . Breast biopsy Left 6/00    Hardcastle  .  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 Tubal ligation    . Left breast biopsy    . Tonsillectomy  age 32  . Total knee arthroplasty Right 11/08/2012    Procedure: RIGHT TOTAL KNEE ARTHROPLASTY;  Surgeon: Gearlean Alf, MD;  Location: [facility];  Service: Orthopedics;  Laterality: Right;  . Root canal  02/11/2012    Current Outpatient Prescriptions  Medication Sig Dispense Refill  . aspirin EC 81 MG tablet Take 81 mg by mouth daily.    . Aspirin-Acetaminophen-Caffeine (EXCEDRIN MIGRAINE PO) Take by mouth. As needed    . Azelastine HCl 0.15 % SOLN Place into the nose.    . celecoxib (CELEBREX) 200 MG capsule Take 200 mg by mouth daily.    . cephALEXin (KEFLEX) 500 MG capsule Take 500 mg by mouth 2 (two) times daily.    Marland Kitchen  doxepin (SINEQUAN) 25 MG capsule Take 25 mg by mouth at bedtime.    Marland Kitchen doxylamine, Sleep, (UNISOM) 25 MG tablet Take 50 mg by mouth at bedtime as needed.    Marland Kitchen escitalopram (LEXAPRO) 20 MG tablet Take 20 mg by mouth daily.    Marland Kitchen estradiol (ESTRACE) 0.1 MG/GM vaginal cream Use 1/2 gram vaginally nightly for 2 weeks, then use 1/2 gram vaginally two times per week. 42.5 g 1  . LORazepam (ATIVAN) 1 MG tablet Take 2 mg by mouth daily.     Marland Kitchen MAGNESIUM PO Take 1 tablet by mouth daily. 2-4 times per day- 150 mg    . Melatonin 3 MG TABS Take 5 mg by mouth daily.     . montelukast (SINGULAIR) 10 MG tablet Take 10 mg by mouth at bedtime.    Marland Kitchen nystatin-triamcinolone (MYCOLOG II) cream Apply 1 application topically 2 (two) times daily. Apply to affected area BID for up to 7 days. 60 g 0  . thyroid (ARMOUR) 60 MG tablet Take 120-180 mg by mouth daily before breakfast. Takes 119mcg 6days and 12mcg on day 7    . trimethoprim (TRIMPEX) 100 MG tablet Take 1 tablet (100 mg total) by mouth 2 (two) times daily. 90 tablet 0  . valACYclovir (VALTREX) 1000 MG tablet Take 0.5 tablets (500 mg total) by mouth daily. 90 tablet 0  . DIFLUCAN 150 MG tablet TAKE 1 TABLET. REPEAT ONE  IN 5 DAYS. (Patient not taking: Reported on 12/12/2014) 2 tablet 1   Current Facility-Administered Medications  Medication Dose Route Frequency Provider Last Rate Last Dose  . sodium phosphate (FLEET) 7-19 GM/118ML enema 1 enema  1 enema Rectal Daily PRN Larey Seat, MD         ALLERGIES: Penicillins; Codeine; Provigil; Seroquel; Xyrem; Ciprofloxacin; Monistat; and Terazol  Family History  Problem Relation Age of Onset  . Breast cancer Mother   . Breast cancer Maternal Aunt   . Breast cancer Maternal Aunt   . Breast cancer Maternal Aunt   . Breast cancer Maternal Aunt   . Aneurysm Father   . Migraines Father   . High blood pressure Brother   . Heart disease      Grandmother    Social History   Social History  . Marital Status:  Married    Spouse Name: [name]  . Number of Children: 0  . Years of Education: 14   Occupational History  . Not on file.   Social History Main Topics  . Smoking status: Passive Smoke Exposure - Never Smoker  . Smokeless tobacco: Never Used  . Alcohol Use: 0.0 oz/week  0 Standard drinks or equivalent per week     Comment: 1 glass - occas.  . Drug Use: No  . Sexual Activity:    Partners: Male    Birth Control/ Protection: Post-menopausal   Other Topics Concern  . Not on file   Social History Narrative   Patient is married [name]).   Patient drinks 2-4 cups of coffee daily.   Patient is retired.   Patient has two years of college.   Patient is right-handed.                   Review of Systems  Genitourinary:       Vaginal itching  Musculoskeletal: Positive for myalgias.  Endo/Heme/Allergies: Positive for polydipsia.  All other systems reviewed and are negative. She was just checked for diabetes and was negative   PHYSICAL EXAMINATION:    BP 112/64 mmHg  Pulse 88  Resp 14  Wt 179 lb (81.194 kg)  LMP 02/25/1995 (Approximate)    General appearance: alert, cooperative and appears stated age  Pelvic: External genitalia:  no lesions, mild erythema, mild atrophy              Urethra:  normal appearing urethra with no masses, tenderness or lesions              Bartholins and Skenes: normal                 Vagina: atrophic vaginal mucosa, no abnormal discharge, no lesions.              Cervix: no lesions               Chaperone was present for exam.  Wet prep: no clue, no trich, +WBC KOH: no yeast PH: 5  Urine dip is negative  Lab report reviewed from Dr Delman Cheadle: moderate candida albicans from the culture from her thumb, planned treatment is fluconazole 200 mg q week x 4 weeks  The right thumb with very mild erythema and swelling around the nail bed on one side  ASSESSMENT Genital pruritus, negative vaginal slides Urinary frequency Polydipsia, recent normal  glucose Yeast infection in her hand, concerns over treatment    PLAN Treat vulva with a steroid ointment x 1-2 weeks Vulvar skin care reviewed Send a wet prep probe Reviewed labs from her primary (will have scanned), her recent glucose was 92 Reviewed fluconazole with the patient, showed her the risks of adverse reactions from the UTD website. Explained that this is not a very high dose   An After Visit Summary was printed and given to the patient.  Over 25 minutes face to face time of which over 50% was spent in counseling.

## 2014-12-13 LAB — WET PREP BY MOLECULAR PROBE
Candida species: NEGATIVE
Gardnerella vaginalis: NEGATIVE
Trichomonas vaginosis: NEGATIVE

## 2014-12-14 DIAGNOSIS — B372 Candidiasis of skin and nail: Secondary | ICD-10-CM | POA: Diagnosis not present

## 2014-12-14 DIAGNOSIS — Z79899 Other long term (current) drug therapy: Secondary | ICD-10-CM | POA: Diagnosis not present

## 2015-01-09 DIAGNOSIS — F331 Major depressive disorder, recurrent, moderate: Secondary | ICD-10-CM | POA: Diagnosis not present

## 2015-01-10 DIAGNOSIS — K625 Hemorrhage of anus and rectum: Secondary | ICD-10-CM | POA: Diagnosis not present

## 2015-01-10 DIAGNOSIS — K642 Third degree hemorrhoids: Secondary | ICD-10-CM | POA: Diagnosis not present

## 2015-01-10 DIAGNOSIS — K5900 Constipation, unspecified: Secondary | ICD-10-CM | POA: Diagnosis not present

## 2015-01-16 ENCOUNTER — Other Ambulatory Visit: Payer: Self-pay

## 2015-01-16 DIAGNOSIS — Z1231 Encounter for screening mammogram for malignant neoplasm of breast: Secondary | ICD-10-CM

## 2015-01-23 ENCOUNTER — Ambulatory Visit: Payer: Medicare Other | Admitting: Obstetrics & Gynecology

## 2015-01-23 ENCOUNTER — Telehealth: Payer: Self-pay | Admitting: Obstetrics & Gynecology

## 2015-01-23 ENCOUNTER — Encounter: Payer: Self-pay | Admitting: Obstetrics & Gynecology

## 2015-01-23 DIAGNOSIS — K5900 Constipation, unspecified: Secondary | ICD-10-CM | POA: Diagnosis not present

## 2015-01-23 NOTE — Telephone Encounter (Signed)
Patient rescheduled her appointment today because she is having bowel issues and going to see Dr. Thana Farr same as appointment here today.

## 2015-01-24 ENCOUNTER — Ambulatory Visit
Admission: RE | Admit: 2015-01-24 | Discharge: 2015-01-24 | Disposition: A | Discharge status: Home / Self Care | Payer: Medicare Other | Source: Ambulatory Visit | Attending: Gastroenterology | Admitting: Gastroenterology

## 2015-01-24 ENCOUNTER — Other Ambulatory Visit: Payer: Self-pay | Admitting: Gastroenterology

## 2015-01-24 ENCOUNTER — Telehealth: Payer: Self-pay | Admitting: Obstetrics & Gynecology

## 2015-01-24 DIAGNOSIS — R109 Unspecified abdominal pain: Secondary | ICD-10-CM

## 2015-01-24 NOTE — Telephone Encounter (Signed)
Patient calling requesting the date of her last colonoscopy.

## 2015-01-24 NOTE — Telephone Encounter (Signed)
Spoke with patient. Advised patient that per our records as seen below from aex on 02/15/2014 last colonoscopy was performed on 07/15/2010. Patient is due for colonoscopy again 07/15/2015. Patient is agreeable. Patient is requesting that a copy of the colonoscopy report be sent to her verified home address on file. Verbal request form for release of medical records completed with three patient identifiers. To front desk for records to be sent to patient's home.  Health Maintenance: Pap: 11/04/11 Negative History of abnormal Pap: no MMG: 02/07/14 Bi-Rads 1: Negative Colonoscopy: 07/15/10 polyps x2 - f/u in 5 years BMD: 2015 with Dr. Forde Dandy at [facility], minimal change TDaP: 10/10/10  Screening Labs: Hgb today: No, Urine today: Leuks ++  Routing to provider for final review. Patient agreeable to disposition. Will close encounter.

## 2015-01-30 ENCOUNTER — Encounter: Payer: Self-pay | Admitting: Obstetrics and Gynecology

## 2015-01-30 ENCOUNTER — Ambulatory Visit (INDEPENDENT_AMBULATORY_CARE_PROVIDER_SITE_OTHER): Payer: Medicare Other | Admitting: Obstetrics and Gynecology

## 2015-01-30 VITALS — BP 134/72 | HR 88 | Resp 15 | Ht 66.5 in | Wt 194.0 lb

## 2015-01-30 DIAGNOSIS — K5901 Slow transit constipation: Secondary | ICD-10-CM

## 2015-01-30 DIAGNOSIS — Z124 Encounter for screening for malignant neoplasm of cervix: Secondary | ICD-10-CM | POA: Diagnosis not present

## 2015-01-30 DIAGNOSIS — N941 Unspecified dyspareunia: Secondary | ICD-10-CM

## 2015-01-30 DIAGNOSIS — Z01419 Encounter for gynecological examination (general) (routine) without abnormal findings: Secondary | ICD-10-CM | POA: Diagnosis not present

## 2015-01-30 DIAGNOSIS — N393 Stress incontinence (female) (male): Secondary | ICD-10-CM

## 2015-01-30 DIAGNOSIS — B009 Herpesviral infection, unspecified: Secondary | ICD-10-CM | POA: Diagnosis not present

## 2015-01-30 DIAGNOSIS — N952 Postmenopausal atrophic vaginitis: Secondary | ICD-10-CM

## 2015-01-30 MED ORDER — VALACYCLOVIR HCL 500 MG PO TABS: 500.0000 mg | ORAL_TABLET | Freq: Two times a day (BID) | ORAL | Status: DC

## 2015-01-30 NOTE — Patient Instructions (Addendum)
EXERCISE AND DIET:  We recommended that you start or continue a regular exercise program for good health. Regular exercise means any activity that makes your heart beat faster and makes you sweat.  We recommend exercising at least 30 minutes per day at least 3 days a week, preferably 4 or 5.  We also recommend a diet low in fat and sugar.  Inactivity, poor dietary choices and obesity can cause diabetes, heart attack, stroke, and kidney damage, among others.    ALCOHOL AND SMOKING:  Women should limit their alcohol intake to no more than 7 drinks/beers/glasses of wine (combined, not each!) per week. Moderation of alcohol intake to this level decreases your risk of breast cancer and liver damage. And of course, no recreational drugs are part of a healthy lifestyle.  And absolutely no smoking or even second hand smoke. Most people know smoking can cause heart and lung diseases, but did you know it also contributes to weakening of your bones? Aging of your skin?  Yellowing of your teeth and nails?  CALCIUM AND VITAMIN D:  Adequate intake of calcium and Vitamin D are recommended.  The recommendations for exact amounts of these supplements seem to change often, but generally speaking 600 mg of calcium (either carbonate or citrate) and 800 units of Vitamin D per day seems prudent. Certain women may benefit from higher intake of Vitamin D.  If you are among these women, your doctor will have told you during your visit.    PAP SMEARS:  Pap smears, to check for cervical cancer or precancers,  have traditionally been done yearly, although recent scientific advances have shown that most women can have pap smears less often.  However, every woman still should have a physical exam from her gynecologist every year. It will include a breast check, inspection of the vulva and vagina to check for abnormal growths or skin changes, a visual exam of the cervix, and then an exam to evaluate the size and shape of the uterus and  ovaries.  And after 74 years of age, a rectal exam is indicated to check for rectal cancers. We will also provide age appropriate advice regarding health maintenance, like when you should have certain vaccines, screening for sexually transmitted diseases, bone density testing, colonoscopy, mammograms, etc.   MAMMOGRAMS:  All women over 40 years old should have a yearly mammogram. Many facilities now offer a "3D" mammogram, which may cost around $50 extra out of pocket. If possible,  we recommend you accept the option to have the 3D mammogram performed.  It both reduces the number of women who will be called back for extra views which then turn out to be normal, and it is better than the routine mammogram at detecting truly abnormal areas.    COLONOSCOPY:  Colonoscopy to screen for colon cancer is recommended for all women at age 50.  We know, you hate the idea of the prep.  We agree, BUT, having colon cancer and not knowing it is worse!!  Colon cancer so often starts as a polyp that can be seen and removed at colonscopy, which can quite literally save your life!  And if your first colonoscopy is normal and you have no family history of colon cancer, most women don't have to have it again for 10 years.  Once every ten years, you can do something that may end up saving your life, right?  We will be happy to help you get it scheduled when you are ready.    Be sure to check your insurance coverage so you understand how much it will cost.  It may be covered as a preventative service at no cost, but you should check your particular policy.     Kegel Exercises The goal of Kegel exercises is to isolate and exercise your pelvic floor muscles. These muscles act as a hammock that supports the rectum, vagina, small intestine, and uterus. As the muscles weaken, the hammock sags and these organs are displaced from their normal positions. Kegel exercises can strengthen your pelvic floor muscles and help you to improve  bladder and bowel control, improve sexual response, and help reduce many problems and some discomfort during pregnancy. Kegel exercises can be done anywhere and at any time. HOW TO PERFORM KEGEL EXERCISES 1. Locate your pelvic floor muscles. To do this, squeeze (contract) the muscles that you use when you try to stop the flow of urine. You will feel a tightness in the vaginal area (women) and a tight lift in the rectal area (men and women). 2. When you begin, contract your pelvic muscles tight for 2-5 seconds, then relax them for 2-5 seconds. This is one set. Do 4-5 sets with a short pause in between. 3. Contract your pelvic muscles for 8-10 seconds, then relax them for 8-10 seconds. Do 4-5 sets. If you cannot contract your pelvic muscles for 8-10 seconds, try 5-7 seconds and work your way up to 8-10 seconds. Your goal is 4-5 sets of 10 contractions each day. Keep your stomach, buttocks, and legs relaxed during the exercises. Perform sets of both short and long contractions. Vary your positions. Perform these contractions 3-4 times per day. Perform sets while you are:   Lying in bed in the morning.  Standing at lunch.  Sitting in the late afternoon.  Lying in bed at night. You should do 40-50 contractions per day. Do not perform more Kegel exercises per day than recommended. Overexercising can cause muscle fatigue. Continue these exercises for for at least 15-20 weeks or as directed by your caregiver.   This information is not intended to replace advice given to you by your health care provider. Make sure you discuss any questions you have with your health care provider.   Document Released: 01/28/2012 Document Revised: 03/03/2014 Document Reviewed: 01/28/2012 Elsevier Interactive Patient Education 2016 [employer]  

## 2015-01-30 NOTE — Progress Notes (Signed)
Patient ID: [name], female   DOB: 1940-09-12, 74 y.o.   MRN: [id number] 74 y.o. G0P0 MarriedCaucasianF here for annual exam.  The patient is PMP no vaginal bleeding. Not sexually active. The last time they tried having sex it was very painful. In the past she was on vaginal estrogen. Not currently using it, she will call if she wants to restart it.  She continues to have hot flashes, stable, occurs 5-8 x a day. Night sweats 2-3 times a night.  She has GSI with valsalva and a full bladder. Tolerable.  She has been using laxatives for 40 years, she is dependent on them. She has seen a GI MD Neville Route, MD). She has had issues with bleeding hemorrhoids. Recent banding of hemorrhoids. He started her on Linzess, it didn't help. She has tried Surveyor, quantity, not working. She drank a bottle of magnesium citrate, no BM, she had some brown watery d/c, no real BM. Last real BM was with lots of laxatives on Saturday. She is struggling with this bowel issue. She is going back to see the GI MD tomorrow.  She has a h/o hsv, used to get one outbreak a year. Currently on suppression.     Patient's last menstrual period was 02/25/1995 (approximate).          Sexually active: No.  The current method of family planning is post menopausal status.    Exercising: Yes.    walking and swimming Smoker:  no  Health Maintenance: Pap:  02-15-14 WNL  History of abnormal Pap:  no MMG:  02-07-14 WNL Colonoscopy:  07-15-10 DR. Mann adenomatous polyp- repeat in 5 years BMD:   10-18-13 osteopenia  TDaP:  10-10-10 Gardasil: N/A   reports that she has been passively smoking.  She has never used smokeless tobacco. She reports that she drinks alcohol. She reports that she does not use illicit drugs.  Past Medical History  Diagnosis Date  . Migraine 10/88    Spillman  . 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 Fibromyalgia 8/98    Truslow  . 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 Plantar fasciitis   .  Osteoarthritis 2007    Deveschwar  . Vitreous degeneration of right eye   . Nuclear sclerosis   . Impaired hearing     left ear  . Scarlet fever as child  . Constipation   . OSA on CPAP 2/07    uses cpap setting of 10  . Pain last week    left under breast pain   . Insomnia     circadian rhythm component  . Depression   . Thyroid disorder   . Insomnia     Past Surgical History  Procedure Laterality Date  . Tonsillectomy and adenoidectomy  1952  . De quervain's release Right 10/97    Sypher  . Breast biopsy Left 6/00    Hardcastle  . 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 Tubal ligation    . Left breast biopsy    . Tonsillectomy  age 82  . Total knee arthroplasty Right 11/08/2012    Procedure: RIGHT TOTAL KNEE ARTHROPLASTY;  Surgeon: Gearlean Alf, MD;  Location: [facility];  Service: Orthopedics;  Laterality: Right;  . Root canal  02/11/2012    Current Outpatient Prescriptions  Medication Sig Dispense Refill  . aspirin EC 81 MG tablet Take 81 mg by mouth daily.    . Aspirin-Acetaminophen-Caffeine (EXCEDRIN MIGRAINE PO) Take by mouth.  As needed    . Azelastine HCl 0.15 % SOLN Place into the nose.    Marland Kitchen doxepin (SINEQUAN) 25 MG capsule Take 25 mg by mouth at bedtime.    Marland Kitchen doxylamine, Sleep, (UNISOM) 25 MG tablet Take 50 mg by mouth at bedtime as needed.    Marland Kitchen escitalopram (LEXAPRO) 20 MG tablet Take 20 mg by mouth daily.    Marland Kitchen estradiol (ESTRACE) 0.1 MG/GM vaginal cream Use 1/2 gram vaginally nightly for 2 weeks, then use 1/2 gram vaginally two times per week. 42.5 g 1  . Linaclotide (LINZESS) 145 MCG CAPS capsule Take 145 mcg by mouth daily.    Marland Kitchen LORazepam (ATIVAN) 1 MG tablet Take 2 mg by mouth daily.     Marland Kitchen MAGNESIUM PO Take 1 tablet by mouth daily. 2-4 times per day- 150 mg    . Melatonin 3 MG TABS Take 5 mg by mouth daily.     . montelukast (SINGULAIR) 10 MG tablet Take 10 mg by mouth at bedtime.    Marland Kitchen nystatin-triamcinolone (MYCOLOG II) cream Apply 1 application topically 2  (two) times daily. Apply to affected area BID for up to 7 days. 60 g 0  . thyroid (ARMOUR) 60 MG tablet Take 120-180 mg by mouth daily before breakfast. Takes 125mcg 6days and 182mcg on day 7    . trimethoprim (TRIMPEX) 100 MG tablet Take 1 tablet (100 mg total) by mouth 2 (two) times daily. 90 tablet 0  . valACYclovir (VALTREX) 1000 MG tablet Take 0.5 tablets (500 mg total) by mouth daily. 90 tablet 0   Current Facility-Administered Medications  Medication Dose Route Frequency Provider Last Rate Last Dose  . sodium phosphate (FLEET) 7-19 GM/118ML enema 1 enema  1 enema Rectal Daily PRN Larey Seat, MD        Family History  Problem Relation Age of Onset  . Breast cancer Mother   . Breast cancer Maternal Aunt   . Breast cancer Maternal Aunt   . Breast cancer Maternal Aunt   . Breast cancer Maternal Aunt   . Aneurysm Father   . Migraines Father   . High blood pressure Brother   . Heart disease      Grandmother    Review of Systems  Constitutional: Positive for unexpected weight change.  HENT: Negative.   Eyes: Negative.   Respiratory: Negative.   Cardiovascular: Negative.   Gastrointestinal: Positive for nausea, vomiting, diarrhea, constipation and blood in stool.  Endocrine: Negative.        Excessive thirst   Genitourinary: Negative.        Hot flashes Loss of urine with sneeze or cough   Musculoskeletal: Positive for myalgias.  Skin: Negative.   Allergic/Immunologic: Negative.   Neurological: Negative.   Psychiatric/Behavioral: Negative.        Depression    Exam:   BP 134/72 mmHg  Pulse 88  Resp 15  Ht 5' 6.5" (1.689 m)  Wt 194 lb (87.998 kg)  BMI 30.85 kg/m2  LMP 02/25/1995 (Approximate)  Weight change: @WEIGHTCHANGE @ Height:   Height: 5' 6.5" (168.9 cm)  Ht Readings from Last 3 Encounters:  01/30/15 5' 6.5" (1.689 m)  12/11/14 5' 6.5" (1.689 m)  07/20/14 5\' 7"  (1.702 m)    General appearance: alert, cooperative and appears stated age Head:  Normocephalic, without obvious abnormality, atraumatic Neck: no adenopathy, supple, symmetrical, trachea midline and thyroid normal to inspection and palpation Lungs: clear to auscultation bilaterally Breasts: normal appearance, no masses or tenderness Heart: regular  rate and rhythm Abdomen: soft, non-tender; bowel sounds normal; no masses,  no organomegaly Extremities: extremities normal, atraumatic, no cyanosis or edema Skin: Skin color, texture, turgor normal. No rashes or lesions Lymph nodes: Cervical, supraclavicular, and axillary nodes normal. No abnormal inguinal nodes palpated Neurologic: Grossly normal   Pelvic: External genitalia:  no lesions              Urethra:  normal appearing urethra with no masses, tenderness or lesions              Bartholins and Skenes: normal                 Vagina: normal appearing vagina with normal color and discharge, no lesions. Atrophic. Able to insert 2 fingers without pain              Cervix: no lesions               Bimanual Exam:  Uterus:  normal size, contour, position, consistency, mobility, non-tender              Adnexa: no mass, fullness, tenderness               Rectovaginal: Confirms               Anus:  normal sphincter tone, no lesions  Chaperone was present for exam.  A:  Well Woman with normal exam  Vasomotor symptoms, she declines treatment  GSI, tolerable  Severe constipation  Dyspareunia, not currently sexually active  HSV, on suppression, not sexually active, h/o infrequent outbreaks  P:   No pap this year  Mammogram due  Colonoscopy UTD  DEXA next summer with Dr Forde Dandy  Call if she wants to use estrace cream  Use a lubricant and she should control rate and depth of penetration if sexually active  Kegels  Prn valtrex

## 2015-01-31 DIAGNOSIS — K641 Second degree hemorrhoids: Secondary | ICD-10-CM | POA: Diagnosis not present

## 2015-01-31 DIAGNOSIS — K5900 Constipation, unspecified: Secondary | ICD-10-CM | POA: Diagnosis not present

## 2015-02-06 DIAGNOSIS — M797 Fibromyalgia: Secondary | ICD-10-CM | POA: Diagnosis not present

## 2015-02-06 DIAGNOSIS — M170 Bilateral primary osteoarthritis of knee: Secondary | ICD-10-CM | POA: Diagnosis not present

## 2015-02-06 DIAGNOSIS — M19041 Primary osteoarthritis, right hand: Secondary | ICD-10-CM | POA: Diagnosis not present

## 2015-02-06 DIAGNOSIS — M4302 Spondylolysis, cervical region: Secondary | ICD-10-CM | POA: Diagnosis not present

## 2015-02-14 DIAGNOSIS — K641 Second degree hemorrhoids: Secondary | ICD-10-CM | POA: Diagnosis not present

## 2015-02-22 ENCOUNTER — Ambulatory Visit
Admission: RE | Admit: 2015-02-22 | Discharge: 2015-02-22 | Disposition: A | Discharge status: Home / Self Care | Payer: Medicare Other | Source: Ambulatory Visit

## 2015-02-22 DIAGNOSIS — Z1231 Encounter for screening mammogram for malignant neoplasm of breast: Secondary | ICD-10-CM | POA: Diagnosis not present

## 2015-03-02 DIAGNOSIS — E559 Vitamin D deficiency, unspecified: Secondary | ICD-10-CM | POA: Diagnosis not present

## 2015-03-02 DIAGNOSIS — E784 Other hyperlipidemia: Secondary | ICD-10-CM | POA: Diagnosis not present

## 2015-03-02 DIAGNOSIS — Z6831 Body mass index (BMI) 31.0-31.9, adult: Secondary | ICD-10-CM | POA: Diagnosis not present

## 2015-03-02 DIAGNOSIS — M797 Fibromyalgia: Secondary | ICD-10-CM | POA: Diagnosis not present

## 2015-03-02 DIAGNOSIS — E038 Other specified hypothyroidism: Secondary | ICD-10-CM | POA: Diagnosis not present

## 2015-03-02 DIAGNOSIS — D126 Benign neoplasm of colon, unspecified: Secondary | ICD-10-CM | POA: Diagnosis not present

## 2015-03-02 DIAGNOSIS — M859 Disorder of bone density and structure, unspecified: Secondary | ICD-10-CM | POA: Diagnosis not present

## 2015-03-14 ENCOUNTER — Telehealth: Payer: Self-pay | Admitting: Obstetrics & Gynecology

## 2015-03-14 NOTE — Telephone Encounter (Signed)
(  [facility] mail order pharmacy). Patient is having some dental work soon and would like a prescription for Diflucan due to antibiotic use. Last seen 01/30/15.

## 2015-03-14 NOTE — Telephone Encounter (Signed)
Spoke with patient. Patient states that she will be having two crowns replaced next week. Will have to be on antibiotics prior to her appointment on 03/19/2015. Patient is requesting a prescription for Diflucan to have on hand with taking antibiotics. "Dr.Miller has done this for me in the past." Patient was last seen for aex with Dr.Jertson on 01/30/2015. Routing to [place] for review and advise. Patient's requests brand name only Diflucan for insurance coverage.

## 2015-03-15 NOTE — Telephone Encounter (Signed)
The patient has such a complicated history, reviewed Dr Quincy Simmonds, we prefer she come in for evaluation with symptoms.

## 2015-03-16 NOTE — Telephone Encounter (Signed)
Spoke with patient. Advised of message as seen below. Patient is asking if Dr.Miller said this. Advised all of the physicians in the office recommend an office visit if symptomatic to ensure proper treatment. Patient states she has to send her prescription in ahead of time to a mail order pharmacy to obtain brand name only Diflucan and it takes a week to get. States if she came in with symptoms she would have to wait another week to be treated due to cost of the mediation any where else. "I do not understand the difference in this year and last year when she did this for me other than I am older. I am very disappointment. Maybe writing a letter to [place] will work." Apologized to patient. Advised this is an office protocol that all physicians follow.  Routing to provider for final review. Patient agreeable to disposition. Will close encounter.

## 2015-03-27 DIAGNOSIS — L821 Other seborrheic keratosis: Secondary | ICD-10-CM | POA: Diagnosis not present

## 2015-03-27 DIAGNOSIS — L814 Other melanin hyperpigmentation: Secondary | ICD-10-CM | POA: Diagnosis not present

## 2015-03-27 DIAGNOSIS — D1800 Hemangioma unspecified site: Secondary | ICD-10-CM | POA: Diagnosis not present

## 2015-03-27 DIAGNOSIS — D1722 Benign lipomatous neoplasm of skin and subcutaneous tissue of left arm: Secondary | ICD-10-CM | POA: Diagnosis not present

## 2015-03-27 DIAGNOSIS — D2271 Melanocytic nevi of right lower limb, including hip: Secondary | ICD-10-CM | POA: Diagnosis not present

## 2015-03-27 DIAGNOSIS — B372 Candidiasis of skin and nail: Secondary | ICD-10-CM | POA: Diagnosis not present

## 2015-03-27 DIAGNOSIS — Z808 Family history of malignant neoplasm of other organs or systems: Secondary | ICD-10-CM | POA: Diagnosis not present

## 2015-03-27 DIAGNOSIS — D2261 Melanocytic nevi of right upper limb, including shoulder: Secondary | ICD-10-CM | POA: Diagnosis not present

## 2015-03-27 DIAGNOSIS — Z23 Encounter for immunization: Secondary | ICD-10-CM | POA: Diagnosis not present

## 2015-04-05 DIAGNOSIS — G441 Vascular headache, not elsewhere classified: Secondary | ICD-10-CM | POA: Diagnosis not present

## 2015-04-05 DIAGNOSIS — M9902 Segmental and somatic dysfunction of thoracic region: Secondary | ICD-10-CM | POA: Diagnosis not present

## 2015-04-05 DIAGNOSIS — M9901 Segmental and somatic dysfunction of cervical region: Secondary | ICD-10-CM | POA: Diagnosis not present

## 2015-04-05 DIAGNOSIS — M9900 Segmental and somatic dysfunction of head region: Secondary | ICD-10-CM | POA: Diagnosis not present

## 2015-04-05 DIAGNOSIS — M546 Pain in thoracic spine: Secondary | ICD-10-CM | POA: Diagnosis not present

## 2015-04-10 DIAGNOSIS — F331 Major depressive disorder, recurrent, moderate: Secondary | ICD-10-CM | POA: Diagnosis not present

## 2015-04-13 ENCOUNTER — Ambulatory Visit: Payer: Medicare Other | Admitting: Obstetrics & Gynecology

## 2015-05-08 ENCOUNTER — Ambulatory Visit (INDEPENDENT_AMBULATORY_CARE_PROVIDER_SITE_OTHER): Payer: Medicare Other | Admitting: Sports Medicine

## 2015-05-08 ENCOUNTER — Encounter: Payer: Self-pay | Admitting: Sports Medicine

## 2015-05-08 VITALS — BP 125/61 | Ht 66.5 in | Wt 190.0 lb

## 2015-05-08 DIAGNOSIS — M5030 Other cervical disc degeneration, unspecified cervical region: Secondary | ICD-10-CM

## 2015-05-08 MED ORDER — TRAMADOL HCL 50 MG PO TABS: 50.0000 mg | ORAL_TABLET | Freq: Four times a day (QID) | ORAL | Status: DC | PRN

## 2015-05-08 NOTE — Assessment & Plan Note (Signed)
Significant changes on older MRI I think her acute flare was triggered by traveling  Continue Celebrex Start tramadol every 6 hours until she breaks the spasm in her neck Easy range of motion Heat and cold as desired  Will increase her doxepin dose to 75  If not improving in 2 weeks I want to reevaluate her

## 2015-05-08 NOTE — Progress Notes (Signed)
Patient ID: [name], female   DOB: 1940/09/01, 74 y.o.   MRN: [id number]  Chief complaint worsening neck pain  Patient first started having a lot of neck pain 10-11 years ago At the time she had an MRI showing multilevel degenerative disc disease and arthritic changes Following this she did physical therapy She does continue to work on posture She is treated by Dr. 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  Review of systems No tingling in the hands No numbness No weakness of grip  Physical examination The patient looks uncomfortable and is sitting with her neck very rigid BP 125/61 mmHg  Ht 5' 6.5" (1.689 m)  Wt 190 lb (86.183 kg)  BMI 30.21 kg/m2  LMP 02/25/1995 (Approximate)  Palpation shows significant spasm over the trapezius bilaterally Neck motion is extremely limited on rotation-only about 20 to the right and less than 30 on the left Flexion and extension are more normal but are painful Lateral bend is more limited on left and is less painful  Reflexes are only 1+ triceps and brachioradialis  C5-T1 sensory testing is normal Motor testing of the upper extremity is normal

## 2015-05-08 NOTE — Patient Instructions (Signed)
You have advanced arthritis and degenerative disk disease  Work on posture - head in neutral - ears over shoulder and cheek bone over breast bone Easy rotation Head nodding  Ice/ heat/ biofreeze in combination  Start tramadol every 6 hours - when feeling better twice a day  Up the doxepin to 75 mgm Stop Unisom  Let me know how this is working over the next 2 weeks

## 2015-05-23 ENCOUNTER — Other Ambulatory Visit (HOSPITAL_COMMUNITY): Payer: Self-pay | Admitting: Psychiatry

## 2015-06-19 ENCOUNTER — Other Ambulatory Visit: Payer: Self-pay | Admitting: *Deleted

## 2015-06-19 DIAGNOSIS — J300 Vasomotor rhinitis: Secondary | ICD-10-CM | POA: Diagnosis not present

## 2015-06-19 DIAGNOSIS — J329 Chronic sinusitis, unspecified: Secondary | ICD-10-CM | POA: Diagnosis not present

## 2015-06-19 MED ORDER — DOXEPIN HCL 25 MG PO CAPS: 25.0000 mg | ORAL_CAPSULE | Freq: Three times a day (TID) | ORAL | Status: DC | PRN

## 2015-07-02 DIAGNOSIS — F331 Major depressive disorder, recurrent, moderate: Secondary | ICD-10-CM | POA: Diagnosis not present

## 2015-07-09 ENCOUNTER — Ambulatory Visit (INDEPENDENT_AMBULATORY_CARE_PROVIDER_SITE_OTHER): Payer: Medicare Other | Admitting: Neurology

## 2015-07-09 ENCOUNTER — Encounter: Payer: Self-pay | Admitting: Neurology

## 2015-07-09 VITALS — BP 126/72 | HR 76 | Resp 20 | Ht 66.0 in | Wt 194.0 lb

## 2015-07-09 DIAGNOSIS — F489 Nonpsychotic mental disorder, unspecified: Secondary | ICD-10-CM | POA: Diagnosis not present

## 2015-07-09 DIAGNOSIS — F5105 Insomnia due to other mental disorder: Secondary | ICD-10-CM | POA: Diagnosis not present

## 2015-07-09 DIAGNOSIS — Z9989 Dependence on other enabling machines and devices: Secondary | ICD-10-CM

## 2015-07-09 DIAGNOSIS — G4733 Obstructive sleep apnea (adult) (pediatric): Secondary | ICD-10-CM

## 2015-07-09 MED ORDER — BUPROPION HCL ER (SR) 150 MG PO TB12: 150.0000 mg | ORAL_TABLET | Freq: Two times a day (BID) | ORAL | Status: DC

## 2015-07-09 NOTE — Patient Instructions (Signed)
Bipolar Disorder Bipolar disorder is a mental illness. The term bipolar disorder actually is used to describe a group of disorders that all share varying degrees of emotional highs and lows that can interfere with daily functioning, such as work, school, or relationships. Bipolar disorder also can lead to drug abuse, hospitalization, and suicide. The emotional highs of bipolar disorder are periods of elation or irritability and high energy. These highs can range from a mild form (hypomania) to a severe form (mania). People experiencing episodes of hypomania may appear energetic, excitable, and highly productive. People experiencing mania may behave impulsively or erratically. They often make poor decisions. They may have difficulty sleeping. The most severe episodes of mania can involve having very distorted beliefs or perceptions about the world and seeing or hearing things that are not real (psychotic delusions and hallucinations).  The emotional lows of bipolar disorder (depression) also can range from mild to severe. Severe episodes of bipolar depression can involve psychotic delusions and hallucinations. Sometimes people with bipolar disorder experience a state of mixed mood. Symptoms of hypomania or mania and depression are both present during this mixed-mood episode. SIGNS AND SYMPTOMS There are signs and symptoms of the episodes of hypomania and mania as well as the episodes of depression. The signs and symptoms of hypomania and mania are similar but vary in severity. They include:  Inflated self-esteem or feeling of increased self-confidence.  Decreased need for sleep.  Unusual talkativeness (rapid or pressured speech) or the feeling of a need to keep talking.  Sensation of racing thoughts or constant talking, with quick shifts between topics that may or may not be related (flight of ideas).  Decreased ability to focus or concentrate.  Increased purposeful activity, such as work, studies,  or social activity, or nonproductive activity, such as pacing, squirming and fidgeting, or finger and toe tapping.  Impulsive behavior and use of poor judgment, resulting in high-risk activities, such as having unprotected sex or spending excessive amounts of money. Signs and symptoms of depression include the following:   Feelings of sadness, hopelessness, or helplessness.  Frequent or uncontrollable episodes of crying.  Lack of feeling anything or caring about anything.  Difficulty sleeping or sleeping too much.  Inability to enjoy the things you used to enjoy.   Desire to be alone all the time.   Feelings of guilt or worthlessness.  Lack of energy or motivation.   Difficulty concentrating, remembering, or making decisions.  Change in appetite or weight beyond normal fluctuations.  Thoughts of death or the desire to harm yourself. DIAGNOSIS  Bipolar disorder is diagnosed through an assessment by your caregiver. Your caregiver will ask questions about your emotional episodes. There are two main types of bipolar disorder. People with type I bipolar disorder have manic episodes with or without depressive episodes. People with type II bipolar disorder have hypomanic episodes and major depressive episodes, which are more serious than mild depression. The type of bipolar disorder you have can make an important difference in how your illness is monitored and treated. Your caregiver may ask questions about your medical history and use of alcohol or drugs, including prescription medication. Certain medical conditions and substances also can cause emotional highs and lows that resemble bipolar disorder (secondary bipolar disorder).  TREATMENT  Bipolar disorder is a long-term illness. It is best controlled with continuous treatment rather than treatment only when symptoms occur. The following treatments can be prescribed for bipolar disorders:  Medication--Medication can be prescribed by  a doctor that  is an expert in treating mental disorders (psychiatrists). Medications called mood stabilizers are usually prescribed to help control the illness. Other medications are sometimes added if symptoms of mania, depression, or psychotic delusions and hallucinations occur despite the use of a mood stabilizer. °· Talk therapy--Some forms of talk therapy are helpful in providing support, education, and guidance. °A combination of medication and talk therapy is best for managing the disorder over time. A procedure in which electricity is applied to your brain through your scalp (electroconvulsive therapy) is used in cases of severe mania when medication and talk therapy do not work or work too slowly. °  °This information is not intended to replace advice given to you by your health care provider. Make sure you discuss any questions you have with your health care provider. °  °Document Released: 05/19/2000 Document Revised: 03/03/2014 Document Reviewed: 03/08/2012 °Elsevier Interactive Patient Education ©2016 [employer] ° °

## 2015-07-09 NOTE — Progress Notes (Signed)
PATIENT: [name] DOB: 1940/04/12  REASON FOR VISIT: follow up for hypomania and insomnia, UARS.  HISTORY FROM: patient  HISTORY OF PRESENT ILLNESS: [name] is a 75 year old female with a history of generalized anxiety disorder, hypomania and insomnia.    Last note by MM. 2015  Patient was given medication called "PURE" for psychosis and that sent her into a manic episode. She was purchasing 2000 dollars worth of clothing. She was very agitated and aggressive.  She was also having some hallucinations.She has stopped that medicine and was started on Risperdal.  She is now a patient of Dr. Casimiro Needle, who  took her off Risperdal and put her clonazepam in the morning and lorazepam at night.  She states that has been working well, with the exception that she felt tired. Patient is no longer agitated and aggressive nor having hallucination. No new medical issues since last seen. Patient states that she had 3 appointments with Dr. Casimiro Needle.  She is very happy with his care. She has been having new difficulties sleeping after a trip to [place], returned on 11-29-13, this is now alleviated with the use of Unisom, a safe and non addictive medication.She uses black out curtains in her bedroom, her husband reads on a kindle, no TV, cool temperatures.The couple sleeps in the same bedroom.   Interval history from 12-11-14, Mrs. [name] had a rough spring this year after traveling she contracted a sinus infection that she could not get rid off. She was treated 3 times with antibiotics and concomitantly use decongestants. She was also placed on prednisone. She saw my colleague Dr. Jaynee Eagles when she suffered from migraines. Dr. Jaynee Eagles felt that this was all sinusitis related and the headaches had resolved once the sinusitis was controlled. The me today for a compliance visit on her CPAP. She endorsed today the fatigue severity score at 44 points and the Epworth sleepiness score at only 1 points. She has 100%  compliance over the last 30 days of CPAP use the machine is set at 10 cm water pressure with out EPR average user time is 8 hours and 50 minutes with a residual AHI of 2.4. There is a mild to moderate air leak on some nights seen. There should be no adjustments made to her current settings. During our visit today she saw the [employer] for the dreams station and dream wear mask, which is travel friendly.  [name], MD at 05/08/2015 5:54 PM     Status: Signed       Expand All Collapse All   Patient ID: [name], female DOB: May 10, 1940, 75 y.o. MRN: [id number]  Chief complaint worsening neck pain  Patient first started having a lot of neck pain 10-11 years ago At the time she had an MRI showing multilevel degenerative disc disease and arthritic changes Following this she did physical therapy She does continue to work on posture She is treated by Dr. 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  Review of systems No tingling in the hands No numbness No weakness of grip  Physical examination The patient looks uncomfortable and is sitting with her neck very rigid BP 125/61 mmHg  Ht 5' 6.5" (1.689 m)  Wt 190 lb (86.183 kg)  BMI 30.21 kg/m2  LMP 02/25/1995 (Approximate)  Palpation shows significant spasm over the trapezius bilaterally Neck motion is extremely limited on rotation-only about 20 to the right and less than 30 on the left Flexion and extension  are more normal but are painful Lateral bend is more limited on left and is less painful  Reflexes are only 1+ triceps and brachioradialis  C5-T1 sensory testing is normal Motor testing of the upper extremity is normal            Degenerative disc disease, cervical - [name], MD at 05/08/2015 6:02 PM     Status: Written Related Problem: Degenerative disc disease, cervical   Expand All Collapse All   Significant changes on older MRI I think her acute flare was triggered by traveling  Continue Celebrex Start tramadol every 6 hours until she breaks the spasm in her neck Easy range of motion Heat and cold as desired  Will increase her doxepin dose to 75  If not improving in 2 weeks I want to reevaluate her        Interval history from 07/09/2015, Mrs. times made me aware that she had recently seen Dr. [name] in sports medicine, for cervical pain, she went on to trip with her husband and slept well and her pain was actually fairly well controlled., They had a long layover results sleep one day, on the first day of the trip. She did well in terms of sleep and pain for the rest of the trip. After 2 weeks when she returned she had trouble sleeping, pressured speech and also personality changes. She states that she was very angry at her husband. She went to water aerobics,and was yelling at a friend who reacted shocked and in disbelief. She went to see Dr Casimiro Needle, who felt tramadol and lexapro were interacting in a negative way with the doxepin.  She has now again neck pain, but is afraid to return to her medication , causing a manic episode.  She feels no relief from Excederin Migraine. This morning she again has a headache but she had a couple of days of headache freedom in between. She has also complained that her thought processes are not streamed, but she is easier distracted or losing her train of thought.  FSS at 46, Epworth 1, she continues to sleep well on 3 mg of  Lunesta and 10 mg Melatonin  but she does have more frequent headaches and neck pain at the same time. She continues with CPAP .  The patient is 77% compliant with his CPAP use an average of 6 hours and 27 minutes, set pressure is 10 cm water residual AHI is 1.5 she had the highest and longest use of the machine on April 23 and since then has slightly declined day by day. I would like to add that she was not traveling with her new CPAP machine until the 20th, she has an older machine but cannot be downloaded that she used during her travels. She is likely 100% compliant.    REVIEW OF SYSTEMS: Full 14 system review of systems performed and notable only for:   07-09-2015  Epworth 1,  FSS 46  Constitutional: Fatigue,  but improved since decompansation in April  ( 75th Birthday ). Eyes: Light sensitivity Gastrointestinal: Constipation  Endocrine: He in tolerance, excessive thirst, excessive eating Musculoskeletal: Joint pain, joint swelling, aching muscles, muscle cramps, neck pain, neck stiffness Neurological: Headache, not acute- mainly cervicalgia. Sinuitis. She has a sinus infection after travelling to [facility] spring 2016.  Severe migrainous headaches followed.    ALLERGIES: Allergies  Allergen Reactions  . Penicillins Anaphylaxis    Tongue swelling  . Codeine Nausea Only  . Provigil [Modafinil]   . Seroquel [Quetiapine Fumarate]   . Xyrem [Sodium Oxybate]     hallucination  . Ciprofloxacin Rash  . Monistat [Miconazole] Rash  . Terazol [Terconazole] Rash    HOME MEDICATIONS: Outpatient Prescriptions Prior to Visit  Medication Sig Dispense Refill  . aspirin EC 81 MG tablet Take 81 mg by mouth daily.    . Aspirin-Acetaminophen-Caffeine (EXCEDRIN MIGRAINE PO) Take by mouth. As needed    . celecoxib (CELEBREX) 200 MG capsule Take 200 mg by mouth daily.    Marland Kitchen LORazepam (ATIVAN) 1 MG tablet Take 2 mg by mouth daily.     Marland Kitchen MAGNESIUM PO Take 1 tablet by mouth daily. 2-4 times per  day- 150 mg    . Melatonin 3 MG TABS Take 10 mg by mouth daily.     . traMADol (ULTRAM) 50 MG tablet Take 1 tablet (50 mg total) by mouth every 6 (six) hours as needed. 90 tablet 2  . VITAMIN D, ERGOCALCIFEROL, PO Take by mouth.    . doxepin (SINEQUAN) 25 MG capsule Take 1 capsule (25 mg total) by mouth 3 (three) times daily as needed. 270 capsule 1  . doxylamine, Sleep, (UNISOM) 25 MG tablet Take 50 mg by mouth at bedtime as needed.    Marland Kitchen escitalopram (LEXAPRO) 20 MG tablet Take 20 mg by mouth daily.     No facility-administered medications prior to visit.    PAST MEDICAL HISTORY: Past Medical History  Diagnosis Date  . Migraine 10/88    [place]  . 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 Fibromyalgia 8/98    [place]  . 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 Plantar fasciitis   . Osteoarthritis 2007    [place]  . Vitreous degeneration of right eye   . Nuclear sclerosis   . Impaired hearing     left ear  . Scarlet fever as child  . Constipation   . OSA on CPAP 2/07    uses cpap setting of 10  . Pain last week    left under breast pain   . Insomnia     circadian rhythm component  . Depression   . Thyroid disorder   . Insomnia     PAST SURGICAL HISTORY: Past Surgical History  Procedure Laterality Date  . Tonsillectomy and adenoidectomy  1952  . De quervain's release Right 10/97    [place]  . Breast biopsy Left 6/00    [place]  . 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 Tubal ligation    . Left breast biopsy    . Tonsillectomy  age 13  . Total knee arthroplasty Right 11/08/2012    Procedure: RIGHT TOTAL KNEE ARTHROPLASTY;  Surgeon: Gearlean Alf, MD;  Location: [facility];  Service: Orthopedics;  Laterality: Right;  . Root canal  02/11/2012    FAMILY HISTORY: Family History  Problem Relation Age of Onset  . Breast cancer Mother   . Breast cancer Maternal Aunt   .  Breast cancer Maternal Aunt   . Breast cancer Maternal Aunt   . Breast cancer Maternal  Aunt   . Aneurysm Father   . Migraines Father   . High blood pressure Brother   . Heart disease      Grandmother    SOCIAL HISTORY: Social History   Social History  . Marital Status: Married    Spouse Name: [name]  . Number of Children: 0  . Years of Education: 14   Occupational History  . Not on file.   Social History Main Topics  . Smoking status: Passive Smoke Exposure - Never Smoker  . Smokeless tobacco: Never Used  . Alcohol Use: 0.0 oz/week    0 Standard drinks or equivalent per week     Comment: 1 glass - occas.  . Drug Use: No  . Sexual Activity:    Partners: Male    Birth Control/ Protection: Post-menopausal   Other Topics Concern  . Not on file   Social History Narrative   Patient is married [name]).   Patient drinks 2-4 cups of coffee daily.   Patient is retired.   Patient has two years of college.   Patient is right-handed.                     PHYSICAL EXAM  Filed Vitals:   07/09/15 1529  BP: 126/72  Pulse: 76  Resp: 20  Height: 5\' 6"  (1.676 m)  Weight: 194 lb (87.998 kg)   Body mass index is 31.33 kg/(m^2).  Generalized: Well developed, in no acute distress   Neck circumference 14.5 inches, Mallompatti 2-3, lost weight 185 pounds today , from 202 pounds.    Neurological examination  Mentation: Alert oriented to time, place, history taking. Follows all commands speech and language fluent Cranial nerve - no change in smell or taste - Extraocular movements were full, visual field were full on confrontational test.  Motor: The motor testing reveals 5 over 5 strength of all 4 extremities. Good symmetric motor tone is noted throughout.  Sensory: Sensory testing is intact to soft touch on all 4 extremities. No evidence of extinction is noted.  Coordination: Cerebellar testing reveals good finger-nose-finger and heel-to-shin bilaterally.  Gait and station: Gait is normal. Tandem gait is normal. Romberg is negative. No drift is seen.    Reflexes: Deep tendon reflexes are symmetric and normal bilaterally.   DIAGNOSTIC DATA (LABS, IMAGING, TESTING) - I reviewed patient records, labs, notes, testing and imaging myself where available. I reviewed Dr Ferdinand Lango notes and the CPAP download.   ASSESSMENT AND PLAN:  1. 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  2. Sinusitis mediated headache with migrainous overlay - CPAP may have contributed.  3. Insomnia, cyclic -resolved, chronic - underlying present. She is very sensitive to time changes. She has had multiple decompensations in the past due to insomnia, medication side effects.   First advise to use Ativan 1 tab at bedtime and another tablet in middle of the night.   She can continue with prn Unisom. She has Insomnia caused after the jet lag from a trip to the [facility].  Patient was seen by Dr. Casimiro Needle, who recommended follow up with me. Marland Kitchen  He maintains her on clonazepam in the morning and lorazepam in the evening.  She feels that she is doing well with this.  The patient will follow-up with me, Dr.  Trysta Showman.      Larey Seat , MD    07/09/2015, 3:49 PM    [facility] [address] [place], [place] [address] [phone]

## 2015-07-12 ENCOUNTER — Telehealth: Payer: Self-pay | Admitting: Neurology

## 2015-07-12 NOTE — Telephone Encounter (Signed)
I spoke to pt and advised her that Dr. Brett Fairy believes that her appt on Tuesday with Dr. Benjamine Mola is the quickest appt she can get. Pt verbalized understanding and appreciation.

## 2015-07-12 NOTE — Telephone Encounter (Signed)
Patient is calling. She was seen on 07-09-15 by Dr. Brett Fairy and is now having problems with sinusitis and migraines and is trying to get an appointment with a doctor to help with this. She has an appointment with Dr. Tamsen Roers Teoh next Tuesday but wants to know if Dr. Brett Fairy knows of someone else that she can see sooner. The patient is having a lot of problems.  Please call and advise.

## 2015-07-12 NOTE — Telephone Encounter (Signed)
This is probably the quickest ! I am sorry.

## 2015-07-17 DIAGNOSIS — R51 Headache: Secondary | ICD-10-CM | POA: Diagnosis not present

## 2015-07-17 DIAGNOSIS — J343 Hypertrophy of nasal turbinates: Secondary | ICD-10-CM | POA: Diagnosis not present

## 2015-07-17 DIAGNOSIS — J310 Chronic rhinitis: Secondary | ICD-10-CM | POA: Diagnosis not present

## 2015-07-17 DIAGNOSIS — J342 Deviated nasal septum: Secondary | ICD-10-CM | POA: Diagnosis not present

## 2015-07-20 ENCOUNTER — Ambulatory Visit (INDEPENDENT_AMBULATORY_CARE_PROVIDER_SITE_OTHER): Payer: Medicare Other | Admitting: Physician Assistant

## 2015-07-20 ENCOUNTER — Encounter: Payer: Self-pay | Admitting: Physician Assistant

## 2015-07-20 VITALS — BP 120/70 | HR 76 | Ht 66.0 in | Wt 194.0 lb

## 2015-07-20 DIAGNOSIS — Z01810 Encounter for preprocedural cardiovascular examination: Secondary | ICD-10-CM | POA: Diagnosis not present

## 2015-07-20 DIAGNOSIS — R079 Chest pain, unspecified: Secondary | ICD-10-CM | POA: Diagnosis not present

## 2015-07-20 NOTE — Progress Notes (Signed)
Patient ID: WINOGENE FICKLIN, female   DOB: 01/30/1941, 75 y.o.   MRN: [id number]    Date:  07/20/2015   ID:  JANNA BOOKER, DOB 27-Dec-1940, MRN [id number]  PCP:   Melinda Crutch, MD  Primary Cardiologist:  Claiborne Billings  Chief Complaint  Patient presents with  . Clearance for colonoscopy    Asymptomatic today     History of Present Illness: EISELE DOWNHAM is a 75 y.o. female, who is retired Catering manager, with a remote history of rheumatic fever with scarlet fever as a child. She also has a history of sleep apnea on CPAP therapy, followed by Dr. Kathaleen Grinder, a history of fibromyalgias, as well as osteoarthritis.  At her last office visit June 2015 she underwent an echocardiogram which revealed normal ejection fraction and wall motion. Grade 1 diastolic dysfunction. Trivial aortic and mitral valve regurgitation in the left atrium was mildly dilated.  She was also supposed to have a cardiac stress test which was never completed.   She is here today for a 2 year follow-up And get clearance to have colonoscopy.  She apparently reported on her pre-colonoscopy questionnaire that she occasionally has chest pain. It happens less than left of her sternum it's very sharp last 1-2 seconds. She currently walks to 3 miles a day 5 times per week and also does water aerobics 3-4 times per week for 1 hour. During exercise she does not have any chest pain. The short episodes that she's been having occur at rest she had total right knee replacement in September 2016 without any, occasions.  The patient currently denies nausea, vomiting, fever, shortness of breath, orthopnea, dizziness, PND, cough, congestion, abdominal pain, hematochezia, melena, lower extremity edema, claudication.  Wt Readings from Last 3 Encounters:  07/20/15 194 lb (87.998 kg)  07/09/15 194 lb (87.998 kg)  05/08/15 190 lb (86.183 kg)     Past Medical History  Diagnosis Date  . Migraine 10/88    Spillman  . 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  Fibromyalgia 8/98    [place]  . 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 Plantar fasciitis   . Osteoarthritis 2007    Deveschwar  . Vitreous degeneration of right eye   . Nuclear sclerosis   . Impaired hearing     left ear, wears hearing aids  . Scarlet fever as child  . Constipation   . OSA on CPAP 2/07    uses cpap setting of 10  . Pain last week    left under breast pain   . Insomnia     circadian rhythm component  . Depression   . Thyroid disorder   . Insomnia     Current Outpatient Prescriptions  Medication Sig Dispense Refill  . aspirin EC 81 MG tablet Take 81 mg by mouth daily.    . Aspirin-Acetaminophen-Caffeine (EXCEDRIN MIGRAINE PO) Take by mouth. As needed    . buPROPion (WELLBUTRIN SR) 150 MG 12 hr tablet Take 1 tablet (150 mg total) by mouth 2 (two) times daily. 60 tablet 1  . celecoxib (CELEBREX) 200 MG capsule Take 200 mg by mouth daily.    . Eszopiclone 3 MG TABS Take 3 mg by mouth at bedtime. Take immediately before bedtime    . LORazepam (ATIVAN) 1 MG tablet Take 2 mg by mouth daily.     Marland Kitchen MAGNESIUM PO Take 1 tablet by mouth daily. 2-4 times per day- 150 mg    . Melatonin 3 MG  TABS Take 10 mg by mouth daily.     . traMADol (ULTRAM) 50 MG tablet Take 1 tablet (50 mg total) by mouth every 6 (six) hours as needed. 90 tablet 2  . VITAMIN D, ERGOCALCIFEROL, PO Take by mouth.     No current facility-administered medications for this visit.    Allergies:    Allergies  Allergen Reactions  . Penicillins Anaphylaxis    Tongue swelling  . Codeine Nausea Only  . Provigil [Modafinil]   . Seroquel [Quetiapine Fumarate]   . Xyrem [Sodium Oxybate]     hallucination  . Ciprofloxacin Rash  . Monistat [Miconazole] Rash  . Terazol [Terconazole] Rash    Social History:  The patient  reports that she has been passively smoking.  She has never used smokeless tobacco. She reports that she drinks alcohol. She reports that she does not use illicit drugs.   Family  history:   Family History  Problem Relation Age of Onset  . Breast cancer Mother   . Breast cancer Maternal Aunt   . Breast cancer Maternal Aunt   . Breast cancer Maternal Aunt   . Breast cancer Maternal Aunt   . Aneurysm Father   . Migraines Father   . High blood pressure Brother   . Heart disease      Grandmother    ROS:  Please see the history of present illness.  All other systems reviewed and negative.   PHYSICAL EXAM: VS:  BP 120/70 mmHg  Pulse 76  Ht 5\' 6"  (1.676 m)  Wt 194 lb (87.998 kg)  BMI 31.33 kg/m2  LMP 02/25/1995 (Approximate) Obese, well developed, in no acute distress HEENT: Pupils are equal round react to light accommodation extraocular movements are intact.  Neck: no JVDNo cervical lymphadenopathy. Cardiac: Regular rate and rhythm without murmurs rubs or gallops. Lungs:  clear to auscultation bilaterally, no wheezing, rhonchi or rales Abd: soft, nontender, positive bowel sounds all quadrants, no hepatosplenomegaly Ext: no lower extremity edema.  2+ radial and dorsalis pedis pulses. Skin: warm and dry Neuro:  Grossly normal  EKG:  Normal sinus rhythm rate 74 bpm    ASSESSMENT AND PLAN:  Problem List Items Addressed This Visit    Preop cardiovascular exam - Primary   Chest pain     Patient's chest pain is noncardiac. She is able exercise daily without any angina. She has no acute or ischemic looking EKG changes. She is okay for colonoscopy from a cardiac standpoint. She can follow-up with Dr. Claiborne Billings as needed.

## 2015-07-20 NOTE — Patient Instructions (Addendum)
Your physician recommends that you schedule a follow-up appointment as needed with Dr. Nena Polio, PA has cleared you for your colonoscopy.  Clearance letter printed and given to patient & also routed to Dr. Earlean Shawl via [facility]

## 2015-07-24 DIAGNOSIS — J300 Vasomotor rhinitis: Secondary | ICD-10-CM | POA: Diagnosis not present

## 2015-07-24 DIAGNOSIS — J329 Chronic sinusitis, unspecified: Secondary | ICD-10-CM | POA: Diagnosis not present

## 2015-07-24 DIAGNOSIS — R05 Cough: Secondary | ICD-10-CM | POA: Diagnosis not present

## 2015-07-25 ENCOUNTER — Telehealth: Payer: Self-pay | Admitting: Neurology

## 2015-07-25 DIAGNOSIS — J320 Chronic maxillary sinusitis: Secondary | ICD-10-CM | POA: Diagnosis not present

## 2015-07-25 DIAGNOSIS — Z888 Allergy status to other drugs, medicaments and biological substances status: Secondary | ICD-10-CM | POA: Diagnosis not present

## 2015-07-25 DIAGNOSIS — J3489 Other specified disorders of nose and nasal sinuses: Secondary | ICD-10-CM | POA: Diagnosis not present

## 2015-07-25 DIAGNOSIS — Z880 Allergy status to penicillin: Secondary | ICD-10-CM | POA: Diagnosis not present

## 2015-07-25 DIAGNOSIS — Z881 Allergy status to other antibiotic agents status: Secondary | ICD-10-CM | POA: Diagnosis not present

## 2015-07-25 DIAGNOSIS — J300 Vasomotor rhinitis: Secondary | ICD-10-CM | POA: Diagnosis not present

## 2015-07-25 DIAGNOSIS — J343 Hypertrophy of nasal turbinates: Secondary | ICD-10-CM | POA: Diagnosis not present

## 2015-07-25 DIAGNOSIS — R0981 Nasal congestion: Secondary | ICD-10-CM | POA: Diagnosis not present

## 2015-07-25 NOTE — Telephone Encounter (Signed)
Pt LM with answering service, operator returned the call, LVM at number provided

## 2015-07-31 ENCOUNTER — Ambulatory Visit (INDEPENDENT_AMBULATORY_CARE_PROVIDER_SITE_OTHER): Payer: Medicare Other | Admitting: Sports Medicine

## 2015-07-31 ENCOUNTER — Encounter: Payer: Self-pay | Admitting: Sports Medicine

## 2015-07-31 VITALS — BP 144/71 | HR 64 | Ht 66.0 in | Wt 194.0 lb

## 2015-07-31 DIAGNOSIS — M810 Age-related osteoporosis without current pathological fracture: Secondary | ICD-10-CM | POA: Diagnosis not present

## 2015-07-31 DIAGNOSIS — M797 Fibromyalgia: Secondary | ICD-10-CM | POA: Diagnosis not present

## 2015-07-31 DIAGNOSIS — F411 Generalized anxiety disorder: Secondary | ICD-10-CM

## 2015-07-31 DIAGNOSIS — M174 Other bilateral secondary osteoarthritis of knee: Secondary | ICD-10-CM | POA: Diagnosis not present

## 2015-07-31 DIAGNOSIS — M5030 Other cervical disc degeneration, unspecified cervical region: Secondary | ICD-10-CM

## 2015-07-31 NOTE — Progress Notes (Signed)
Patient ID: [name], female   DOB: October 14, 1940, 75 y.o.   MRN: [id number]  F/u of cervical degenerative disk disease issues  On march 14 I treated patient for severe flare of her DDD of cervical spine.  ON Doxepin 75 qhs, tramadol 50 bid and celebrex 200 qam her pain dropped from a 7/10 down to a 1 to 2 level.  She was able to take a prolonged trip to [place] and slept through night; had better left arm strength and less radiation; and headaches resolved.  Sequence is somewhat unclear to me but essentially had a flare of sinusitis ( has seen 2 ENTs) and now on nasal sprays.  Unable to sleep 2/2 to nasal congestion and using combination of medicines above along with sudafed as well as long term lexapro she had agitation, jitteryness and some confusion. Per Dr Maureen Chatters note some psychotic and manic issues arose from a medicine called "PURE".    Has seen Dr Emelda Brothers in psychiatry and off of all meds above and placed on Wellbutrin.  Now unable to sleep.  Nauseated she thinks from wellbutrin.  Neck pain returned.  dialy headaches returned.  Stopped wellbutrin after speaking to Dr [facility] office.  In general manic and psychotic features seem to have resolved but chronic pain is now severe again.  Neckpain is now back to 7/10 and unable to lay down or use computer without pain.  Comes for my advice.  Past Hx Significant OA with TKR Chronic sinusitis Anxiety Insomnia  ROS Migraine type headaches with pounding Upper shoulder tightness Restless legs sxs when sleeping poorly  Pexam Looks in discomfort but alert and O x 3 BP 144/71 mmHg  Pulse 64  Ht 5\' 6"  (1.676 m)  Wt 194 lb (87.998 kg)  BMI 31.33 kg/m2  LMP 02/25/1995 (Approximate)  Neck motion causes pain into traps and left arm at: 10 deg extension 20 deg flexion Lat bend left or rt of 10 to 15 deg Rotation of 30 deg RT and 15 deg left  No gross weakness of grip  No weakness of left upper ext  Spasm of trapezius bilat

## 2015-07-31 NOTE — Assessment & Plan Note (Signed)
I want to restart the medicines that gave her relief: Tramadol up to tid Doxepin 75 at HS Celebrex 200 qhs  Monitor carefully for any psychiatric side effects but I think those were triggered by other medications  Cervical collar for periodic neck rest Isometric neck strength  Reck in 1 month

## 2015-07-31 NOTE — Patient Instructions (Signed)
Neck pain was totally resolved on combo of doxepin, tramadol and celebrex Now that you are off this you are having severe neck pain again You have advanced arthritis and degenerative disk disease  Work on posture - head in neutral - ears over shoulder and cheek bone over breast bone Easy rotation Head nodding  Restart doxepin 75 at night Start tramadol 3 x per day until pain less and then move to twice per day celebrex once daily  Discuss with Dr Casimiro Needle if you can continue with just the doxepin and one other drug for sleep  I will leave to him whether to use wellbutrin or lexapro or other medications for mood and depression  Ice/ heat/ biofreeze in combination  Try soft collar for neck - use for rest for 1 to 2 hours at a time  See me in a month  Start tramadol every 6 hours - when feeling better twice a day  Up the doxepin to 75 mgm Stop Unisom  Let me know how this is working over the next 2 weeks

## 2015-07-31 NOTE — Assessment & Plan Note (Signed)
I am concerned because of recent psychotic and manic episodes  Triggered by stimulant medication??    In any case I want her to continue to check changes we made with Dr Casimiro Needle Continue to follow with him for possible ned for other psychiatric medication as not tolerating Wellbutrin

## 2015-08-02 ENCOUNTER — Ambulatory Visit: Payer: Medicare Other | Admitting: Neurology

## 2015-08-02 DIAGNOSIS — F331 Major depressive disorder, recurrent, moderate: Secondary | ICD-10-CM | POA: Diagnosis not present

## 2015-08-10 ENCOUNTER — Encounter: Payer: Self-pay | Admitting: Neurology

## 2015-08-10 ENCOUNTER — Ambulatory Visit (INDEPENDENT_AMBULATORY_CARE_PROVIDER_SITE_OTHER): Payer: Medicare Other | Admitting: Neurology

## 2015-08-10 VITALS — BP 113/76 | HR 78 | Ht 66.0 in | Wt 194.0 lb

## 2015-08-10 DIAGNOSIS — G4486 Cervicogenic headache: Secondary | ICD-10-CM

## 2015-08-10 DIAGNOSIS — Z9114 Patient's other noncompliance with medication regimen: Secondary | ICD-10-CM | POA: Diagnosis not present

## 2015-08-10 DIAGNOSIS — R51 Headache: Secondary | ICD-10-CM | POA: Diagnosis not present

## 2015-08-10 DIAGNOSIS — Z79899 Other long term (current) drug therapy: Secondary | ICD-10-CM | POA: Diagnosis not present

## 2015-08-10 DIAGNOSIS — G43001 Migraine without aura, not intractable, with status migrainosus: Secondary | ICD-10-CM | POA: Diagnosis not present

## 2015-08-10 MED ORDER — RIZATRIPTAN BENZOATE 10 MG PO TBDP: 10.0000 mg | ORAL_TABLET | Freq: Once | ORAL | Status: DC | PRN

## 2015-08-10 MED ORDER — ONDANSETRON 4 MG PO TBDP: 4.0000 mg | ORAL_TABLET | Freq: Three times a day (TID) | ORAL | Status: DC | PRN

## 2015-08-10 NOTE — Progress Notes (Signed)
[facility]    Provider:  Dr Jaynee Eagles Referring Provider: Lona Kettle, MD Primary Care Physician:  Hollace Kinnier, DO  CC: Migraine  Interval history: Migraines were getting more frequent and more painful. No headaches now for a week. Husband here with her. Also more pain in the neck. She has had a daily headache since April 22nd, daily migraines. Combined with heavy pollen and allergies, She was non compliant with medication and went off the medication. She has had neck pain due to degenerative arthritis. She has had neck pain, migraines, insomnia, it has been a "mess" per husband. ENT put her back in sinus medication which has helped. A week ago she was placed back on her neck pain medication. Doing better. For a week her headaches have been better. Headaches have been in the frontal area. She has nausea with the migraines. She take lorazepam, lunesta, tramadol, doxepin, melatonin all at night. Discussed polypharmacy, she really needs to meet with primary care to manage her medications, this combination can cause respiratory failure and confusion, increased risk of falls and other significant sequelae. Advise not to take all at once and see physicians who prescribed all this at night to let them know she has multiple meds now so they are aware of everything she takes.  HPI: [name] is a 75 y.o. female here as a follow up. history of generalized anxiety disorder, hypomania and insomnia. She started having migraines when she was 42. The migraine is in the forehead. Bilateral. Pounding, sensitive to sound and light. Her vision gets wavy before the headache. She saw an ENT at [facility] and diagnosed her with sinusitis and placed her on antibiotics. She is having 10 a month, increased in the last few months. Previously she only had one migraine a month. She still feels like her sinuses are involved with pressure and pain. She uses a cpap at night and that is affecting her sinuses  as well. Her face feels full, head feels full, pressure in the sinuses, can't breath through nostril, ears feel full. She has been traveling extensively within the [place] and overseas. Her ears have been popping for 2 weeks. She last traveled to [place], [place] earlier this month and came back because she was sick. She has nausea and vomiting. She is currently in the middle of her antibiotic regimen.  Reviewed notes, labs and imaging from outside physicians, which showed: Patient has OSA on CPAP and follows with our sleep doctors. She has a history of mania in response to medications.   Review of Systems: Patient complains of symptoms per HPI as well as the following symptoms: No CP, no SOB. Fatigue, light sensitivity, joint pain, back pain, aching muscles, muscle cramps, neck pain, neck stiffness, headaches, agitation, agitation, nervous, decr concentration. Pertinent negatives per HPI. All others negative.  Social History   Social History  . Marital Status: Married    Spouse Name: [name]  . Number of Children: 0  . Years of Education: 14   Occupational History  . Not on file.   Social History Main Topics  . Smoking status: Passive Smoke Exposure - Never Smoker  . Smokeless tobacco: Never Used  . Alcohol Use: 0.0 oz/week    0 Standard drinks or equivalent per week     Comment: 1 glass - occas.  . Drug Use: No  . Sexual Activity:    Partners: Male    Birth Control/ Protection: Post-menopausal   Other Topics Concern  . Not  on file   Social History Narrative   Patient is married [name]).   Patient drinks 2-4 cups of coffee daily.   Patient is retired.   Patient has two years of college.   Patient is right-handed.                   Family History  Problem Relation Age of Onset  . Breast cancer Mother   . Breast cancer Maternal Aunt   . Breast cancer Maternal Aunt   . Breast cancer Maternal Aunt   . Breast cancer Maternal Aunt   . Aneurysm Father   . Migraines  Father   . High blood pressure Brother   . Heart disease      Grandmother    Past Medical History  Diagnosis Date  . Migraine 10/88    Spillman  . 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 Fibromyalgia 8/98    [place]  . 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 Plantar fasciitis   . Osteoarthritis 2007    Deveschwar  . Vitreous degeneration of right eye   . Nuclear sclerosis   . Impaired hearing     left ear, wears hearing aids  . Scarlet fever as child  . Constipation   . OSA on CPAP 2/07    uses cpap setting of 10  . Pain last week    left under breast pain   . Insomnia     circadian rhythm component  . Depression   . Thyroid disorder   . Insomnia     Past Surgical History  Procedure Laterality Date  . Tonsillectomy and adenoidectomy  1952  . De quervain's release Right 10/97    Sypher  . Breast biopsy Left 6/00    [place]  . 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 Tubal ligation    . Left breast biopsy    . Tonsillectomy  age 51  . Total knee arthroplasty Right 11/08/2012    Procedure: RIGHT TOTAL KNEE ARTHROPLASTY;  Surgeon: Gearlean Alf, MD;  Location: [facility];  Service: Orthopedics;  Laterality: Right;  . Root canal  02/11/2012    Current Outpatient Prescriptions  Medication Sig Dispense Refill  . aspirin EC 81 MG tablet Take 81 mg by mouth daily.    . Aspirin-Acetaminophen-Caffeine (EXCEDRIN MIGRAINE PO) Take by mouth. As needed    . celecoxib (CELEBREX) 200 MG capsule Take 200 mg by mouth daily.    Marland Kitchen doxepin (SINEQUAN) 75 MG capsule Take 75 mg by mouth.    . Eszopiclone 3 MG TABS Take 3 mg by mouth at bedtime. Take immediately before bedtime    . LORazepam (ATIVAN) 1 MG tablet Take 2 mg by mouth daily.     Marland Kitchen MAGNESIUM PO Take 1 tablet by mouth daily. 2-4 times per day- 150 mg    . Melatonin 3 MG TABS Take 10 mg by mouth daily.     . traMADol (ULTRAM) 50 MG tablet Take 1 tablet (50 mg total) by mouth every 6 (six) hours as needed. 90 tablet 2    . VITAMIN D, ERGOCALCIFEROL, PO Take by mouth.    . ondansetron (ZOFRAN ODT) 4 MG disintegrating tablet Take 1 tablet (4 mg total) by mouth every 8 (eight) hours as needed for nausea or vomiting. May take with Maxalt for migraine. 45 tablet 11  . rizatriptan (MAXALT-MLT) 10 MG disintegrating tablet Take 1 tablet (10 mg total) by mouth once as needed for migraine.  May repeat in 2 hours if needed 10 tablet 12   No current facility-administered medications for this visit.    Allergies as of 08/10/2015 - Review Complete 08/10/2015  Allergen Reaction Noted  . Penicillins Anaphylaxis 04/29/2011  . Codeine Nausea Only 04/29/2011  . Provigil [modafinil]  03/24/2013  . Seroquel [quetiapine fumarate]  07/20/2014  . Xyrem [sodium oxybate]  06/18/2011  . Ciprofloxacin Rash 07/01/2012  . Monistat [miconazole] Rash 07/01/2012  . Terazol [terconazole] Rash 07/01/2012    Vitals: BP 113/76 mmHg  Pulse 78  Ht 5\' 6"  (1.676 m)  Wt 194 lb (87.998 kg)  BMI 31.33 kg/m2  LMP 02/25/1995 (Approximate) Last Weight:  Wt Readings from Last 1 Encounters:  08/10/15 194 lb (87.998 kg)   Last Height:   Ht Readings from Last 1 Encounters:  08/10/15 5\' 6"  (1.676 m)    Exam: Gen: NAD, conversant, well nourised, well groomed  CV: RRR, no MRG. No Carotid Bruits. No peripheral edema, warm, nontender Eyes: Conjunctivae clear without exudates or hemorrhage  Neuro: Detailed Neurologic Exam  Speech:  Speech is normal; fluent and spontaneous with normal comprehension.  Cognition:  The patient is oriented to person, place, and time;   recent and remote memory intact;   language fluent;   normal attention, concentration,   fund of knowledge Cranial Nerves:  The pupils are equal, round, and reactive to light. The fundi are normal and spontaneous venous pulsations are present. Visual fields are full to finger confrontation. Extraocular movements are intact. Trigeminal  sensation is intact and the muscles of mastication are normal. The face is symmetric. The palate elevates in the midline. Hearing intact. Voice is normal. Shoulder shrug is normal. The tongue has normal motion without fasciculations.   Gait:  Heel-toe and tandem gait are normal.   Motor Observation:  No asymmetry, no atrophy, and no involuntary movements noted. Tone:  Normal muscle tone.   Posture:  Posture is normal. normal erect   Strength:  Strength is V/V in the upper and lower limbs.     Assessment/Plan: 75 year old female with migraine exacerbation due to cervical neck pain, she is feeling better with neck pain improvement.   As far as your medications are concerned, I would like to suggest: At onset of migraine take Maxalt and Zofran. Can repeat in 2 hours if needed.  To prevent or relieve headaches, try the following: Cool Compress. Lie down and place a cool compress on your head.  Avoid headache triggers. If certain foods or odors seem to have triggered your migraines in the past, avoid them. A headache diary might help you identify triggers.  Include physical activity in your daily routine. Try a daily walk or other moderate aerobic exercise.  Manage stress. Find healthy ways to cope with the stressors, such as delegating tasks on your to-do list.  Practice relaxation techniques. Try deep breathing, yoga, massage and visualization.  Eat regularly. Eating regularly scheduled meals and maintaining a healthy diet might help prevent headaches. Also, drink plenty of fluids.  Follow a regular sleep schedule. Sleep deprivation might contribute to headaches Consider biofeedback. With this mind-body technique, you learn to control certain bodily functions - such as muscle tension, heart rate and blood pressure - to prevent headaches or reduce headache pain.    Proceed to emergency room if you experience new or worsening symptoms or symptoms do not resolve, if you  have new neurologic symptoms or if headache is severe, or for any concerning symptom.  Sarina Ill, MD  [facility] [address] [place], [place] [address]  Phone [phone] Fax [phone]  A total of 30 minutes was spent face-to-face with this patient. Over half this time was spent on counseling patient on the migraine diagnosis and different diagnostic and therapeutic options available.

## 2015-08-10 NOTE — Patient Instructions (Signed)
Remember to drink plenty of fluid, eat healthy meals and do not skip any meals. Try to eat protein with a every meal and eat a healthy snack such as fruit or nuts in between meals. Try to keep a regular sleep-wake schedule and try to exercise daily, particularly in the form of walking, 20-30 minutes a day, if you can.   As far as your medications are concerned, I would like to suggest: At onset of migraine take Maxalt and Zofran. Can repeat in 2 hours if needed.  Our phone number is [phone]. We also have an after hours call service for urgent matters and there is a physician on-call for urgent questions. For any emergencies you know to call 911 or go to the nearest emergency room  Ondansetron oral dissolving tablet What is this medicine? ONDANSETRON (on [name] se tron) is used to treat nausea and vomiting caused by chemotherapy. It is also used to prevent or treat nausea and vomiting after surgery. This medicine may be used for other purposes; ask your health care provider or pharmacist if you have questions. What should I tell my health care provider before I take this medicine? They need to know if you have any of these conditions: -heart disease -history of irregular heartbeat -liver disease -low levels of magnesium or potassium in the blood -an unusual or allergic reaction to ondansetron, granisetron, other medicines, foods, dyes, or preservatives -pregnant or trying to get pregnant -breast-feeding How should I use this medicine? These tablets are made to dissolve in the mouth. Do not try to push the tablet through the foil backing. With dry hands, peel away the foil backing and gently remove the tablet. Place the tablet in the mouth and allow it to dissolve, then swallow. While you may take these tablets with water, it is not necessary to do so. Talk to your pediatrician regarding the use of this medicine in children. Special care may be needed. Overdosage: If you think you have taken  too much of this medicine contact a poison control center or emergency room at once. NOTE: This medicine is only for you. Do not share this medicine with others. What if I miss a dose? If you miss a dose, take it as soon as you can. If it is almost time for your next dose, take only that dose. Do not take double or extra doses. What may interact with this medicine? Do not take this medicine with any of the following medications: -apomorphine -certain medicines for fungal infections like fluconazole, itraconazole, ketoconazole, posaconazole, voriconazole -cisapride -dofetilide -dronedarone -pimozide -thioridazine -ziprasidone This medicine may also interact with the following medications: -carbamazepine -certain medicines for depression, anxiety, or psychotic disturbances -fentanyl -linezolid -MAOIs like Carbex, Eldepryl, Marplan, Nardil, and Parnate -methylene blue (injected into a vein) -other medicines that prolong the QT interval (cause an abnormal heart rhythm) -phenytoin -rifampicin -tramadol This list may not describe all possible interactions. Give your health care provider a list of all the medicines, herbs, non-prescription drugs, or dietary supplements you use. Also tell them if you smoke, drink alcohol, or use illegal drugs. Some items may interact with your medicine. What should I watch for while using this medicine? Check with your doctor or health care professional as soon as you can if you have any sign of an allergic reaction. What side effects may I notice from receiving this medicine? Side effects that you should report to your doctor or health care professional as soon as possible: -allergic reactions like skin  rash, itching or hives, swelling of the face, lips, or tongue -breathing problems -confusion -dizziness -fast or irregular heartbeat -feeling faint or lightheaded, falls -fever and chills -loss of balance or coordination -seizures -sweating -swelling  of the hands and feet -tightness in the chest -tremors -unusually weak or tired Side effects that usually do not require medical attention (report to your doctor or health care professional if they continue or are bothersome): -constipation or diarrhea -headache This list may not describe all possible side effects. Call your doctor for medical advice about side effects. You may report side effects to FDA at 1-800-FDA-1088. Where should I keep my medicine? Keep out of the reach of children. Store between 2 and 30 degrees C (36 and 86 degrees F). Throw away any unused medicine after the expiration date. NOTE: This sheet is a summary. It may not cover all possible information. If you have questions about this medicine, talk to your doctor, pharmacist, or health care provider.    2016, [employer]. (2012-11-17 16:21:52)   Rizatriptan disintegrating tablets What is this medicine? RIZATRIPTAN (rye za TRIP tan) is used to treat migraines with or without aura. An aura is a strange feeling or visual disturbance that warns you of an attack. It is not used to prevent migraines. This medicine may be used for other purposes; ask your health care provider or pharmacist if you have questions. What should I tell my health care provider before I take this medicine? They need to know if you have any of these conditions: -bowel disease or colitis -diabetes -family history of heart disease -fast or irregular heart beat -heart or blood vessel disease, angina (chest pain), or previous heart attack -high blood pressure -high cholesterol -history of stroke, transient ischemic attacks (TIAs or mini-strokes), or intracranial bleeding -kidney or liver disease -overweight -poor circulation -postmenopausal or surgical removal of uterus and ovaries -an unusual or allergic reaction to rizatriptan, other medicines, foods, dyes, or preservatives -pregnant or trying to get pregnant -breast-feeding How  should I use this medicine? Take this medicine by mouth. Follow the directions on the prescription label. This medicine is taken at the first symptoms of a migraine. It is not for everyday use. Leave the tablet in the foil package until you are ready to take it. Do not push the tablet through the blister pack. Peel open the blister pack with dry hands and place the tablet on your tongue. The tablet will dissolve rapidly and be swallowed in your saliva. It is not necessary to drink any water to take this medicine. If your migraine headache returns after one dose, you can take another dose as directed. You must leave at least 2 hours between doses, and do not take more than 30 mg total in 24 hours. If there is no improvement at all after the first dose, do not take a second dose without talking to your doctor or health care professional. Do not take your medicine more often than directed. Talk to your pediatrician regarding the use of this medicine in children. While this drug may be prescribed for children as young as 6 years for selected conditions, precautions do apply. Overdosage: If you think you have taken too much of this medicine contact a poison control center or emergency room at once. NOTE: This medicine is only for you. Do not share this medicine with others. What if I miss a dose? This does not apply; this medicine is not for regular use. What may interact with this medicine?  Do not take this medicine with any of the following medicines: -amphetamine, dextroamphetamine or cocaine -dihydroergotamine, ergotamine, ergoloid mesylates, methysergide, or ergot-type medication - do not take within 24 hours of taking rizatriptan -feverfew -MAOIs like Carbex, Eldepryl, Marplan, Nardil, and Parnate - do not take rizatriptan within 2 weeks of stopping MAOI therapy. -other migraine medicines like almotriptan, eletriptan, naratriptan, sumatriptan, zolmitriptan - do not take within 24 hours of taking  rizatriptan -tryptophan This medicine may also interact with the following medications: -medicines for mental depression, anxiety or mood problems -propranolol This list may not describe all possible interactions. Give your health care provider a list of all the medicines, herbs, non-prescription drugs, or dietary supplements you use. Also tell them if you smoke, drink alcohol, or use illegal drugs. Some items may interact with your medicine. What should I watch for while using this medicine? Only take this medicine for a migraine headache. Take it if you get warning symptoms or at the start of a migraine attack. It is not for regular use to prevent migraine attacks. You may get drowsy or dizzy. Do not drive, use machinery, or do anything that needs mental alertness until you know how this medicine affects you. To reduce dizzy or fainting spells, do not sit or stand up quickly, especially if you are an older patient. Alcohol can increase drowsiness, dizziness and flushing. Avoid alcoholic drinks. Smoking cigarettes may increase the risk of heart-related side effects from using this medicine. If you take migraine medicines for 10 or more days a month, your migraines may get worse. Keep a diary of headache days and medicine use. Contact your healthcare professional if your migraine attacks occur more frequently. What side effects may I notice from receiving this medicine? Side effects that you should report to your doctor or health care professional as soon as possible: -allergic reactions like skin rash, itching or hives, swelling of the face, lips, or tongue -fast, slow, or irregular heart beat -increased or decreased blood pressure -seizures -severe stomach pain and cramping, bloody diarrhea -signs and symptoms of a blood clot such as breathing problems; changes in vision; chest pain; severe, sudden headache; pain, swelling, warmth in the leg; trouble speaking; sudden numbness or weakness of the  face, arm or leg -tingling, pain, or numbness in the face, hands, or feet Side effects that usually do not require medical attention (report to your doctor or health care professional if they continue or are bothersome): -drowsiness -dry mouth -feeling warm, flushing, or redness of the face -headache -muscle cramps, pain -nausea, vomiting -unusually weak or tired This list may not describe all possible side effects. Call your doctor for medical advice about side effects. You may report side effects to FDA at 1-800-FDA-1088. Where should I keep my medicine? Keep out of the reach of children. Store at room temperature between 15 and 30 degrees C (59 and 86 degrees F). Protect from light and moisture. Throw away any unused medicine after the expiration date. NOTE: This sheet is a summary. It may not cover all possible information. If you have questions about this medicine, talk to your doctor, pharmacist, or health care provider.    2016, [employer] Standard. (2012-10-12 10:17:42)

## 2015-08-15 ENCOUNTER — Encounter: Payer: Self-pay | Admitting: *Deleted

## 2015-08-20 ENCOUNTER — Ambulatory Visit (INDEPENDENT_AMBULATORY_CARE_PROVIDER_SITE_OTHER): Payer: Medicare Other | Admitting: Internal Medicine

## 2015-08-20 ENCOUNTER — Other Ambulatory Visit: Payer: Self-pay | Admitting: *Deleted

## 2015-08-20 ENCOUNTER — Encounter: Payer: Self-pay | Admitting: Internal Medicine

## 2015-08-20 VITALS — BP 130/70 | HR 75 | Temp 98.2°F | Ht 66.0 in | Wt 202.0 lb

## 2015-08-20 DIAGNOSIS — M5030 Other cervical disc degeneration, unspecified cervical region: Secondary | ICD-10-CM

## 2015-08-20 DIAGNOSIS — E559 Vitamin D deficiency, unspecified: Secondary | ICD-10-CM | POA: Insufficient documentation

## 2015-08-20 DIAGNOSIS — R5382 Chronic fatigue, unspecified: Secondary | ICD-10-CM | POA: Diagnosis not present

## 2015-08-20 DIAGNOSIS — J0101 Acute recurrent maxillary sinusitis: Secondary | ICD-10-CM

## 2015-08-20 DIAGNOSIS — F341 Dysthymic disorder: Secondary | ICD-10-CM | POA: Diagnosis not present

## 2015-08-20 DIAGNOSIS — R51 Headache: Secondary | ICD-10-CM | POA: Diagnosis not present

## 2015-08-20 DIAGNOSIS — G8929 Other chronic pain: Secondary | ICD-10-CM

## 2015-08-20 DIAGNOSIS — E669 Obesity, unspecified: Secondary | ICD-10-CM

## 2015-08-20 DIAGNOSIS — F5105 Insomnia due to other mental disorder: Secondary | ICD-10-CM

## 2015-08-20 DIAGNOSIS — R519 Headache, unspecified: Secondary | ICD-10-CM | POA: Insufficient documentation

## 2015-08-20 DIAGNOSIS — Z9989 Dependence on other enabling machines and devices: Secondary | ICD-10-CM

## 2015-08-20 DIAGNOSIS — K921 Melena: Secondary | ICD-10-CM | POA: Diagnosis not present

## 2015-08-20 DIAGNOSIS — K5901 Slow transit constipation: Secondary | ICD-10-CM | POA: Insufficient documentation

## 2015-08-20 DIAGNOSIS — G4733 Obstructive sleep apnea (adult) (pediatric): Secondary | ICD-10-CM

## 2015-08-20 DIAGNOSIS — E039 Hypothyroidism, unspecified: Secondary | ICD-10-CM | POA: Insufficient documentation

## 2015-08-20 DIAGNOSIS — M797 Fibromyalgia: Secondary | ICD-10-CM | POA: Insufficient documentation

## 2015-08-20 DIAGNOSIS — G9332 Myalgic encephalomyelitis/chronic fatigue syndrome: Secondary | ICD-10-CM | POA: Insufficient documentation

## 2015-08-20 DIAGNOSIS — F418 Other specified anxiety disorders: Secondary | ICD-10-CM

## 2015-08-20 DIAGNOSIS — F331 Major depressive disorder, recurrent, moderate: Secondary | ICD-10-CM | POA: Diagnosis not present

## 2015-08-20 NOTE — Progress Notes (Signed)
Location:  [facility] clinic Provider:  Nitisha Civello L. Mariea Clonts, D.O., C.M.D.  Code Status: full code Goals of Care:  Advanced Directives 08/20/2015  Does patient have an advance directive? Yes  Type of Advance Directive -  Copy of advanced directive(s) in chart? No - copy requested  Would patient like information on creating an advanced directive? -   Chief Complaint  Patient presents with  . New Evaluation    to establish with [facility]    HPI: Patient is a 75 y.o. female seen today to establish with [place].  She was following with Dr. Melinda Crutch for 25-26 yrs.  She has a h/o fibromyalgia, migraines, plantar fasciitis, OA, hypothyroidism, depression, insomnia, and anxiety.  She typically only goes to him when she has a problem.  She needs somebody to look at the whole person.    She does not take flu shots.   Mysterious prevnar on her KPN is not on Dr. Harrington Challenger' records and pt does not recall having it.    Fibromyalgia/OA:  Has difficulty distinguishing her arthritis pain and fibro pain.  Dr. Estanislado Pandy prescribes celebrex.  11/13 pressure points last time.  Does water aerobics 3-4 times per week.  Did swim for a while.   Knees are good now that she's had both replaced.  Pain is worst now between her shoulders and in her neck.  Sees Dr. Oneida Alar about that pain--has DDD and arthritis there.  Has been on tramadol and icing her neck.    Headaches:  Had been having a nearly daily headache before she saw Dr. Oneida Alar (she has his notes).  Also takes doxepin at night.  Ice helps a lot.  Per her husband, her neck pain got better with celebrex, doxepin and tramadol.  She came off her meds before due to concerns that her kidneys would shut down.  Her husband mentions she is not religious about the tramadol due to concerns about constipation and addiction.    Hypothyroidism:  Dr. Forde Dandy manages this.  On armour thyroid.  Last free T3 normal.    Vitamin D deficiency:  Dr. Forde Dandy managing.  Has been on and off of vitamin D 50K  and now is on vitamin D 5000 units 2 per day.  Sees him again 09/03/15.    She is constipated.  For years and years, she took, K swiss for bms.  Was taking otc laxatives.  She was also taking magnesium. She is trying now just to take miralax--is taking 3 scoops per day.  Will go a couple of days without a bm.  She is retaining fluid which she had not.    Has been on laxatives for 20 years.  Dr. Earlean Shawl follows her for that.   Last cscope, she had two polyps.    Had to see cardiology for clearance before cscope due to chest pains.  Has been cleared but has not yet scheduled cscope.  Stools are black.  Stools are also narrow.  She has also seen blood in her stools.  She thinks it's from hemorrhoids.   Previously had external removed before. Does not take iron or pepto bismol.  Has been on an antidepressant for 25 years.  They went on a trip in Oct and then she returned and went to Dr. Casimiro Needle.  They had a perfect storm of problems.  She had stopped her meds.  Has been off of antidepressants for a month.  She sees him again this afternoon.  He ordered lunesta for sleep.  She is also on lorazepam 1mg --2 tablets at bedtime.  Doxepin was also to help with sleep (TCA).  Feels drugged at night.  Has dry mouth.  Also takes melatonin for sleep.  Takes baby asa at hs.    She has sleep apnea and uses cpap at hs.  Dr. Brett Fairy manages this. She sees her every 6 mos.  Couldn't breathe due to pollen at night either.    Was off her nasal sprays and now back on fluticasone and azelastine.  She is going to take clarithromycin for 2 wks and then she'll be done.  Had acute sinusitis on her scan.    She thinks she was on cymbalta and got nauseated.  Wellbutrin also did not go well in the past two months.    Past Medical History  Diagnosis Date  . Migraine 10/88    [place]  . 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 Fibromyalgia 8/98    [place]  . 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 Plantar fasciitis   .  Osteoarthritis 2007    [place]  . Vitreous degeneration of right eye   . Nuclear sclerosis   . Impaired hearing     left ear, wears hearing aids  . Scarlet fever as child  . Constipation   . OSA on CPAP 2/07    uses cpap setting of 10  . Pain last week    left under breast pain   . Insomnia     circadian rhythm component  . Depression   . Thyroid disorder   . Insomnia     Past Surgical History  Procedure Laterality Date  . Tonsillectomy and adenoidectomy  1952  . De quervain's release Right 10/97    [place]  . Breast biopsy Left 6/00    [place]  . 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 Tubal ligation    . Left breast biopsy    . Tonsillectomy  age 67  . Total knee arthroplasty Right 11/08/2012    Procedure: RIGHT TOTAL KNEE ARTHROPLASTY;  Surgeon: Gearlean Alf, MD;  Location: [facility] ORS;  Service: Orthopedics;  Laterality: Right;  . Root canal  02/11/2012   Social History   Social History  . Marital Status: Married    Spouse Name: [name]  . Number of Children: 0  . Years of Education: 2   Social History Main Topics  . Smoking status: Passive Smoke Exposure - Never Smoker  . Smokeless tobacco: Never Used  . Alcohol Use: 0.0 oz/week    0 Standard drinks or equivalent per week     Comment: 1 glass - occas.  . Drug Use: No  . Sexual Activity:    Partners: Male    Birth Control/ Protection: Post-menopausal   Other Topics Concern  . None   Social History Narrative   Patient is married [name]).   Patient drinks 2-4 cups of coffee daily.   Patient is retired.   Patient has two years of college.   Patient is right-handed.                  Family History  Problem Relation Age of Onset  . Breast cancer Mother   . Breast cancer Maternal Aunt   . Breast cancer Maternal Aunt   . Breast cancer Maternal Aunt   . Breast cancer Maternal Aunt   . Aneurysm Father   . Migraines Father   . High blood pressure Brother   . Heart disease  Grandmother     Allergies  Allergen Reactions  . Penicillins Anaphylaxis    Tongue swelling  . Codeine Nausea Only  . Provigil [Modafinil]   . Seroquel [Quetiapine Fumarate]   . Xyrem [Sodium Oxybate]     hallucination  . Ciprofloxacin Rash  . Monistat [Miconazole] Rash  . Terazol [Terconazole] Rash      Medication List       This list is accurate as of: 08/20/15  9:22 AM.  Always use your most recent med list.               aspirin EC 81 MG tablet  Take 81 mg by mouth daily.     azelastine 0.1 % nasal spray  Commonly known as:  ASTELIN  Place 2 sprays into both nostrils 2 (two) times daily. Use in each nostril as directed     CALCIUM MAGNESIUM PO  Take by mouth daily.     celecoxib 200 MG capsule  Commonly known as:  CELEBREX  Take 200 mg by mouth daily.     clarithromycin 500 MG tablet  Commonly known as:  BIAXIN  Take 1 tablet by mouth twice a day for 14 days, start 2 weeks before CT scan     doxepin 75 MG capsule  Commonly known as:  SINEQUAN  Take 75 mg by mouth.     Eszopiclone 3 MG Tabs  Take 3 mg by mouth at bedtime. Take immediately before bedtime     EXCEDRIN MIGRAINE PO  Take by mouth daily.     fluticasone 50 MCG/ACT nasal spray  Commonly known as:  FLONASE  Place 2 sprays into both nostrils daily.     Melatonin 3 MG Tabs  Take 10 mg by mouth daily.     ondansetron 4 MG disintegrating tablet  Commonly known as:  ZOFRAN ODT  Take 1 tablet (4 mg total) by mouth every 8 (eight) hours as needed for nausea or vomiting. May take with Maxalt for migraine.     polyethylene glycol packet  Commonly known as:  MIRALAX / GLYCOLAX  Take 17 g by mouth daily.     rizatriptan 10 MG disintegrating tablet  Commonly known as:  MAXALT-MLT  Take 1 tablet (10 mg total) by mouth once as needed for migraine. May repeat in 2 hours if needed     thyroid 60 MG tablet  Commonly known as:  ARMOUR  Take 120 mg by mouth daily before breakfast. Take 3 tablets on Sunday and  Wednesday     traMADol 50 MG tablet  Commonly known as:  ULTRAM  Take 1 tablet (50 mg total) by mouth every 6 (six) hours as needed.     valACYclovir 500 MG tablet  Commonly known as:  VALTREX  Take 500 mg by mouth 2 (two) times daily.     Vitamin D-3 1000 units Caps  Take by mouth daily.        Review of Systems:  Review of Systems  Constitutional: Positive for malaise/fatigue. Negative for fever and chills.  HENT: Positive for hearing loss. Negative for congestion.   Eyes: Negative for blurred vision.  Respiratory: Negative for cough and shortness of breath.   Cardiovascular: Negative for chest pain, palpitations and leg swelling.  Gastrointestinal: Positive for constipation and melena. Negative for heartburn, nausea, vomiting, abdominal pain, diarrhea and blood in stool.  Genitourinary: Negative for dysuria, urgency and frequency.  Musculoskeletal: Positive for myalgias and neck pain. Negative for falls.  Skin: Negative  for itching and rash.  Neurological: Positive for headaches. Negative for dizziness, loss of consciousness and weakness.  Endo/Heme/Allergies: Does not bruise/bleed easily.  Psychiatric/Behavioral: Positive for depression. Negative for memory loss. The patient is nervous/anxious and has insomnia.     Health Maintenance  Topic Date Due  . PNA vac Low Risk Adult (2 of 2 - PCV13) 11/01/2013  . MAMMOGRAM  02/22/2016  . COLONOSCOPY  07/14/2020  . TETANUS/TDAP  10/09/2020  . DEXA SCAN  Completed  . ZOSTAVAX  Completed    Physical Exam: Filed Vitals:   08/20/15 0859  BP: 130/70  Pulse: 75  Temp: 98.2 F (36.8 C)  TempSrc: Oral  Height: 5\' 6"  (1.676 m)  Weight: 202 lb (91.627 kg)  SpO2: 92%   Body mass index is 32.62 kg/(m^2). Physical Exam  Constitutional: She is oriented to person, place, and time. She appears well-developed and well-nourished. No distress.  HENT:  Head: Normocephalic and atraumatic.  HOH  Cardiovascular: Normal rate, regular  rhythm, normal heart sounds and intact distal pulses.   Pulmonary/Chest: Effort normal and breath sounds normal. No respiratory distress.  Abdominal: Soft. Bowel sounds are normal. She exhibits no distension and no mass. There is no tenderness. There is no rebound and no guarding.  Musculoskeletal: She exhibits tenderness. She exhibits no edema.  Of neck muscles and trapezius  Neurological: She is alert and oriented to person, place, and time. No cranial nerve deficit.  Skin: Skin is warm and dry.  Erythema at corners of mouth, no white plaques in mouth  Psychiatric:  Flat affect    Labs reviewed: No recent labs this year (last in 2016)   Assessment/Plan 1. Melena - had two polyps that were "positive" when she had her last cscope  - reports dark stools which are narrow caliber - needs to see Dr. Earlean Shawl for f/u cscope as recommended  - was cleared by cardiology for this (due to chest pain which it sounds like was fibromyalgia pain) - CBC with Differential/Platelet; Future  2. OSA on CPAP -cont nightly cpap  3. Acute recurrent maxillary sinusitis - advised against clarithromycin since she feels better in terms of headache and head congestion - was to start the abx tomorrow and then get a f/u CT of her sinuses with ENT -advised to ask ENT if the abx are still necessary due to risk of resistance, loose stools, serious polypharmacy issue, etc. (seems unnecessary when she's better) - CBC with Differential/Platelet; Future  4. Degenerative disc disease, cervical -was felt to be cause of frequent almost daily headaches  -improved with therapy with doxepin high dose (TCA on [facility]' list), tramadol, and celebrex -unfortunately, she now has black stools (? Cause--needs cscope)--could be GI bleed from baby asa and celebrex--is not on iron or pepto  5. 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 Slow transit constipation -cont miralax -avoid other mystery herbal supplements -doubt she'll get off of laxatives with her other meds even with healthy diet, fluids, exercise  8. Vitamin D deficiency - taking high doses of D3 (  10000 units per day!) - will check level to check for toxicity - Vitamin D, 25-hydroxy; Future  9. Hypothyroidism, unspecified hypothyroidism type - continues on armour thyroid - reports she switched on her own and Dr. Forde Dandy was not happy -she initially felt better on this vs synthroid - T3, Free; Future - TSH; Future  10. Chronic nonintractable headache, unspecified headache type - due to neck arthritis - for now, cont triple therapy, but may need to d/c celebrex with her dark stools depending on cause (if bleeding)  11. Obesity -discussed increased exercise, dietary changes to help with weight loss as her husband reports it's affecting her self image - Comprehensive metabolic panel; Future - Hemoglobin A1c; Future - Lipid panel; Future  Labs/tests ordered:   Orders Placed This Encounter  Procedures  . CBC with Differential/Platelet    Standing Status: Future     Number of Occurrences:      Standing Expiration Date: 09/24/2015  . Comprehensive metabolic panel    Standing Status: Future     Number of Occurrences:      Standing Expiration Date: 09/24/2015    Order Specific Question:  Has the patient fasted?    Answer:  Yes  . Hemoglobin A1c    Standing Status: Future     Number of Occurrences:      Standing Expiration Date: 09/24/2015  . Lipid panel    Standing Status: Future     Number of Occurrences:       Standing Expiration Date: 09/24/2015    Order Specific Question:  Has the patient fasted?    Answer:  Yes  . T3, Free    Standing Status: Future     Number of Occurrences:      Standing Expiration Date: 09/24/2015  . TSH    Standing Status: Future     Number of Occurrences:      Standing Expiration Date: 09/24/2015  . Vitamin D, 25-hydroxy    Standing Status: Future     Number of Occurrences:      Standing Expiration Date: 09/24/2015    Next appt:  08/31/2015 for annual with labs before  [place]. Moishy Laday, D.O. [facility] [address], [place] [address] Cell Phone (Mon-Fri 8am-5pm):  [phone] On Call:  [phone] & follow prompts after 5pm & weekends Office Phone:  [phone] Office Fax:  [phone]

## 2015-08-20 NOTE — Patient Instructions (Addendum)
Please schedule your colonoscopy ASAP.   Stop calcium with magnesium. Stop melatonin.  Check with Dr. Casimiro Needle. I'd advise against the antibiotics for your sinuses when you feel better already.  Check with ENT.

## 2015-08-24 ENCOUNTER — Telehealth: Payer: Self-pay | Admitting: Cardiovascular Disease

## 2015-08-27 ENCOUNTER — Ambulatory Visit: Payer: Medicare Other | Admitting: Nurse Practitioner

## 2015-08-27 ENCOUNTER — Other Ambulatory Visit: Payer: Medicare Other

## 2015-08-27 DIAGNOSIS — E669 Obesity, unspecified: Secondary | ICD-10-CM | POA: Diagnosis not present

## 2015-08-27 DIAGNOSIS — R739 Hyperglycemia, unspecified: Secondary | ICD-10-CM | POA: Diagnosis not present

## 2015-08-27 DIAGNOSIS — J0101 Acute recurrent maxillary sinusitis: Secondary | ICD-10-CM | POA: Diagnosis not present

## 2015-08-27 DIAGNOSIS — K921 Melena: Secondary | ICD-10-CM | POA: Diagnosis not present

## 2015-08-27 DIAGNOSIS — E039 Hypothyroidism, unspecified: Secondary | ICD-10-CM | POA: Diagnosis not present

## 2015-08-27 DIAGNOSIS — E559 Vitamin D deficiency, unspecified: Secondary | ICD-10-CM | POA: Diagnosis not present

## 2015-08-27 DIAGNOSIS — S60419A Abrasion of unspecified finger, initial encounter: Secondary | ICD-10-CM | POA: Diagnosis not present

## 2015-08-28 LAB — CBC WITH DIFFERENTIAL/PLATELET
Basophils Absolute: 0 10*3/uL (ref 0.0–0.2)
Basos: 0 %
EOS (ABSOLUTE): 0.2 10*3/uL (ref 0.0–0.4)
Eos: 5 %
Hematocrit: 42.1 % (ref 34.0–46.6)
Hemoglobin: 14.1 g/dL (ref 11.1–15.9)
Immature Grans (Abs): 0 10*3/uL (ref 0.0–0.1)
Immature Granulocytes: 0 %
Lymphocytes Absolute: 1.2 10*3/uL (ref 0.7–3.1)
Lymphs: 31 %
MCH: 29.8 pg (ref 26.6–33.0)
MCHC: 33.5 g/dL (ref 31.5–35.7)
MCV: 89 fL (ref 79–97)
Monocytes Absolute: 0.4 10*3/uL (ref 0.1–0.9)
Monocytes: 10 %
Neutrophils Absolute: 2.1 10*3/uL (ref 1.4–7.0)
Neutrophils: 54 %
Platelets: 210 10*3/uL (ref 150–379)
RBC: 4.73 x10E6/uL (ref 3.77–5.28)
RDW: 13.2 % (ref 12.3–15.4)
WBC: 4 10*3/uL (ref 3.4–10.8)

## 2015-08-28 LAB — COMPREHENSIVE METABOLIC PANEL
ALT: 20 IU/L (ref 0–32)
AST: 25 IU/L (ref 0–40)
Albumin/Globulin Ratio: 1.8 (ref 1.2–2.2)
Albumin: 4.2 g/dL (ref 3.5–4.8)
Alkaline Phosphatase: 58 IU/L (ref 39–117)
BUN/Creatinine Ratio: 24 (ref 12–28)
BUN: 14 mg/dL (ref 8–27)
Bilirubin Total: 0.4 mg/dL (ref 0.0–1.2)
CO2: 23 mmol/L (ref 18–29)
Calcium: 9 mg/dL (ref 8.7–10.3)
Chloride: 105 mmol/L (ref 96–106)
Creatinine, Ser: 0.59 mg/dL (ref 0.57–1.00)
GFR calc Af Amer: 104 mL/min/{1.73_m2} (ref >59)
GFR calc non Af Amer: 90 mL/min/{1.73_m2} (ref >59)
Globulin, Total: 2.3 g/dL (ref 1.5–4.5)
Glucose: 96 mg/dL (ref 65–99)
Potassium: 4.5 mmol/L (ref 3.5–5.2)
Sodium: 144 mmol/L (ref 134–144)
Total Protein: 6.5 g/dL (ref 6.0–8.5)

## 2015-08-28 LAB — LIPID PANEL
Chol/HDL Ratio: 3.3 ratio units (ref 0.0–4.4)
Cholesterol, Total: 158 mg/dL (ref 100–199)
HDL: 48 mg/dL (ref >39)
LDL Calculated: 91 mg/dL (ref 0–99)
Triglycerides: 97 mg/dL (ref 0–149)
VLDL Cholesterol Cal: 19 mg/dL (ref 5–40)

## 2015-08-28 LAB — TSH: TSH: 0.259 u[IU]/mL — ABNORMAL LOW (ref 0.450–4.500)

## 2015-08-28 LAB — HEMOGLOBIN A1C
Est. average glucose Bld gHb Est-mCnc: 111 mg/dL
Hgb A1c MFr Bld: 5.5 % (ref 4.8–5.6)

## 2015-08-28 LAB — VITAMIN D 25 HYDROXY (VIT D DEFICIENCY, FRACTURES): Vit D, 25-Hydroxy: 35 ng/mL (ref 30.0–100.0)

## 2015-08-28 LAB — T3, FREE: T3, Free: 5.7 pg/mL — ABNORMAL HIGH (ref 2.0–4.4)

## 2015-08-29 MED ORDER — THYROID 60 MG PO TABS: 120.0000 mg | ORAL_TABLET | Freq: Every day | ORAL | Status: DC

## 2015-08-31 ENCOUNTER — Ambulatory Visit (INDEPENDENT_AMBULATORY_CARE_PROVIDER_SITE_OTHER): Payer: Medicare Other | Admitting: Internal Medicine

## 2015-08-31 ENCOUNTER — Encounter: Payer: Self-pay | Admitting: Internal Medicine

## 2015-08-31 VITALS — BP 130/80 | HR 81 | Temp 98.0°F | Ht 66.0 in | Wt 202.0 lb

## 2015-08-31 DIAGNOSIS — F341 Dysthymic disorder: Secondary | ICD-10-CM | POA: Diagnosis not present

## 2015-08-31 DIAGNOSIS — F5105 Insomnia due to other mental disorder: Secondary | ICD-10-CM

## 2015-08-31 DIAGNOSIS — E559 Vitamin D deficiency, unspecified: Secondary | ICD-10-CM

## 2015-08-31 DIAGNOSIS — G4733 Obstructive sleep apnea (adult) (pediatric): Secondary | ICD-10-CM

## 2015-08-31 DIAGNOSIS — E039 Hypothyroidism, unspecified: Secondary | ICD-10-CM | POA: Diagnosis not present

## 2015-08-31 DIAGNOSIS — K5901 Slow transit constipation: Secondary | ICD-10-CM

## 2015-08-31 DIAGNOSIS — R5382 Chronic fatigue, unspecified: Secondary | ICD-10-CM | POA: Diagnosis not present

## 2015-08-31 DIAGNOSIS — E669 Obesity, unspecified: Secondary | ICD-10-CM | POA: Diagnosis not present

## 2015-08-31 DIAGNOSIS — Z0000 Encounter for general adult medical examination without abnormal findings: Secondary | ICD-10-CM | POA: Diagnosis not present

## 2015-08-31 DIAGNOSIS — K921 Melena: Secondary | ICD-10-CM | POA: Diagnosis not present

## 2015-08-31 DIAGNOSIS — Z9989 Dependence on other enabling machines and devices: Secondary | ICD-10-CM

## 2015-08-31 DIAGNOSIS — F418 Other specified anxiety disorders: Secondary | ICD-10-CM

## 2015-08-31 DIAGNOSIS — M5030 Other cervical disc degeneration, unspecified cervical region: Secondary | ICD-10-CM

## 2015-08-31 DIAGNOSIS — G9332 Myalgic encephalomyelitis/chronic fatigue syndrome: Secondary | ICD-10-CM

## 2015-08-31 DIAGNOSIS — M797 Fibromyalgia: Secondary | ICD-10-CM

## 2015-08-31 NOTE — Progress Notes (Signed)
Location:   [facility]  Place of Service:   Clinic Provider: Melisha Eggleton L. Mariea Clonts, D.O., C.M.D.  [name], [name] Megan Salon, MD as Consulting Physician (Gynecology) Tiajuana Amass, MD as Referring Physician (Allergy and Immunology) Jari Pigg, MD as Consulting Physician (Dermatology) Luberta Mutter, MD as Consulting Physician (Ophthalmology) Linzie Collin, MD as Referring Physician (Otolaryngology) Larey Seat, MD as Consulting Physician (Neurology) Reynold Bowen, MD as Consulting Physician (Endocrinology)  Extended Emergency Contact Information Primary Emergency Contact: Fass,Larry Address: [address] [place]          [place], [place] [address] Johnnette Litter of [place] Phone: [phone] Mobile Phone: [phone] Relation: Spouse  Code Status: Full code Goals of Care: Advanced Directive information Advanced Directives 08/31/2015  Does patient have an advance directive? No  Type of Advance Directive -  Copy of advanced directive(s) in chart? -  Would patient like information on creating an advanced directive? -  Advised to complete and bring a copy  Chief Complaint  Patient presents with  . Medical Management of Chronic Issues  . mmse    26/30 passed clock    HPI: Patient is a 75 y.o. Green seen in today for an annual wellness exam.    Has fibromyalgia and knots all over body.  Lady that does her myofascial release noticed a new knot on her right hip.    Still has not scheduled her cscope.  Tramadol is very constipating.  Is eating a lot of figs and using the miralax.  Is cross and irritable with people and fatigued first thing in the am.    She is hot all of the time.    Depression screen [facility] 2/9 08/31/2015 08/20/2015 07/31/2015 03/22/2014  Decreased Interest 1 1 0 0  Down, Depressed, Hopeless 1 1 0 0  PHQ - 2 Score 2 2 0 0  Altered sleeping - 2 - -  Tired, decreased energy - 1 - -  Change in appetite - 1 -  -  Feeling bad or failure about yourself  - 1 - -  Trouble concentrating - 1 - -  Moving slowly or fidgety/restless - 1 - -  Suicidal thoughts - 0 - -  PHQ-9 Score - 9 - -    Fall Risk  08/31/2015 08/20/2015 07/31/2015 03/22/2014  Falls in the past year? No No No No  Risk for fall due to : - - - Other (Comment)   MMSE - Mini Mental State Exam 08/31/2015  Orientation to time 4  Orientation to Place 5  Registration 3  Attention/ Calculation 4  Recall 3  Language- name 2 objects 2  Language- repeat 1  Language- follow 3 step command 1  Language- read & follow direction 1  Write a sentence 1  Copy design 1  Total score 26  passed clock   Health Maintenance  Topic Date Due  . PNA vac Low Risk Adult (2 of 2 - PCV13) 11/01/2013  . MAMMOGRAM  02/22/2016  . COLONOSCOPY  07/14/2020  . TETANUS/TDAP  10/09/2020  . DEXA SCAN  Completed  . ZOSTAVAX  Completed    Functional Status Survey: Is the patient deaf or have difficulty hearing?: Yes (has hearing aides) Does the patient have difficulty seeing, even when wearing glasses/contacts?: No (sees Dr. Ellie Lunch) Does the patient have difficulty concentrating, remembering, or making decisions?: Yes (says she has ADD and feels it is getting worse over time) Does the patient have  difficulty walking or climbing stairs?: No Does the patient have difficulty dressing or bathing?: No Does the patient have difficulty doing errands alone such as visiting a doctor's office or shopping?: No Exercise?Current Exercise Habits: The patient does not participate in regular exercise at present (wants to get back into walking) Exercise limited by: orthopedic condition(s);psychological condition(s) Diet?  Is on weight watchers, but weighs the same as she did in Feb--up and down 10 lbs  No exam data present  Vision:  Sees Dr.McCuen Hearing: HOH, has hearing aides Dentition:  Had two implants due to dry mouth Pain:  See pain assessment in encounter  Past Medical  History  Diagnosis Date  . Migraine 10/88    [place]  . 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 Fibromyalgia 8/98    [place]  . 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 Plantar fasciitis   . Osteoarthritis 2007    [place]  . Vitreous degeneration of right eye   . Nuclear sclerosis   . Impaired hearing     left ear, wears hearing aids  . Scarlet fever as child  . Constipation   . OSA on CPAP 2/07    uses cpap setting of 10  . Pain last week    left under breast pain   . Insomnia     circadian rhythm component  . Depression   . Thyroid disorder   . Insomnia     Past Surgical History  Procedure Laterality Date  . Tonsillectomy and adenoidectomy  1952  . De quervain's release Right 10/97    [place]  . Breast biopsy Left 6/00    [place]  . 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 Tubal ligation    . Left breast biopsy    . Tonsillectomy  age 41  . Total knee arthroplasty Right 11/08/2012    Procedure: RIGHT TOTAL KNEE ARTHROPLASTY;  Surgeon: Gearlean Alf, MD;  Location: [facility];  Service: Orthopedics;  Laterality: Right;  . Root canal  02/11/2012    Social History   Social History  . Marital Status: Married    Spouse Name: [name]  . Number of Children: 0  . Years of Education: 14   Occupational History  . Not on file.   Social History Main Topics  . Smoking status: Passive Smoke Exposure - Never Smoker  . Smokeless tobacco: Never Used  . Alcohol Use: 0.0 oz/week    0 Standard drinks or equivalent per week     Comment: 1 glass - occas.  . Drug Use: No  . Sexual Activity:    Partners: Male    Birth Control/ Protection: Post-menopausal   Other Topics Concern  . Not on file   Social History Narrative   Patient is married [name]).   Patient drinks 2-4 cups of coffee daily.   Patient is retired.   Patient has two years of college.   Patient is right-handed.                   Allergies  Allergen Reactions  . Penicillins Anaphylaxis     Tongue swelling  . Codeine Nausea Only  . Provigil [Modafinil]   . Seroquel [Quetiapine Fumarate]   . Xyrem [Sodium Oxybate]     hallucination  . Ciprofloxacin Rash  . Monistat [Miconazole] Rash  . Terazol [Terconazole] Rash      Medication List       This list is accurate as of: 08/31/15  9:31 AM.  Always use your most recent med list.               aspirin EC 81 MG tablet  Take 81 mg by mouth daily.     azelastine 0.1 % nasal spray  Commonly known as:  ASTELIN  Place 2 sprays into both nostrils 2 (two) times daily. Use in each nostril as directed     CALCIUM MAGNESIUM PO  Take by mouth daily.     celecoxib 200 MG capsule  Commonly known as:  CELEBREX  Take 200 mg by mouth daily.     clarithromycin 500 MG tablet  Commonly known as:  BIAXIN  Take 1 tablet by mouth twice a day for 14 days, start 2 weeks before CT scan     doxepin 75 MG capsule  Commonly known as:  SINEQUAN  Take 75 mg by mouth.     Eszopiclone 3 MG Tabs  Take 3 mg by mouth at bedtime. Take immediately before bedtime     EXCEDRIN MIGRAINE PO  Take by mouth daily.     fluticasone 50 MCG/ACT nasal spray  Commonly known as:  FLONASE  Place 2 sprays into both nostrils daily.     Melatonin 3 MG Tabs  Take 10 mg by mouth daily.     polyethylene glycol packet  Commonly known as:  MIRALAX / GLYCOLAX  Take 17 g by mouth daily.     thyroid 60 MG tablet  Commonly known as:  ARMOUR  Take 2 tablets (120 mg total) by mouth daily before breakfast. Take 3 tablets on Wednesday only     traMADol 50 MG tablet  Commonly known as:  ULTRAM  Take 1 tablet (50 mg total) by mouth every 6 (six) hours as needed.     valACYclovir 500 MG tablet  Commonly known as:  VALTREX  Take 500 mg by mouth 2 (two) times daily. Patient taking at onset of symptoms     Vitamin D-3 1000 units Caps  Take by mouth daily.        Review of Systems:  Review of Systems  Constitutional: Positive for malaise/fatigue. Negative  for fever and chills.       Warm at night  HENT: Positive for hearing loss. Negative for congestion.   Eyes: Negative for blurred vision.  Respiratory: Negative for shortness of breath.   Cardiovascular: Positive for leg swelling. Negative for chest pain and palpitations.  Gastrointestinal: Positive for constipation and melena. Negative for abdominal pain and blood in stool.  Genitourinary: Negative for dysuria, urgency and frequency.  Musculoskeletal: Positive for myalgias. Negative for falls.  Skin: Negative for rash.  Neurological: Negative for dizziness, loss of consciousness, weakness and headaches.  Psychiatric/Behavioral: Positive for memory loss. Negative for depression. The patient is nervous/anxious and has insomnia.     Physical Exam: Filed Vitals:   08/31/15 0835  BP: 130/80  Pulse: 81  Temp: 98 F (36.7 C)  TempSrc: Oral  Height: 5\' 6"  (1.676 m)  Weight: 202 lb (91.627 kg)  SpO2: 95%   Body mass index is 32.62 kg/(m^2). Physical Exam  Constitutional: She is oriented to person, place, and time. She appears well-developed and well-nourished. No distress.  HENT:  Head: Normocephalic and atraumatic.  Right Ear: External ear normal.  Left Ear: External ear normal.  Nose: Nose normal.  Mouth/Throat: Oropharynx is clear and moist. No oropharyngeal exudate.  Eyes: Conjunctivae and EOM are normal. Pupils are equal, round, and reactive to light.  Neck: Normal  range of motion. Neck supple. No JVD present. No thyromegaly present.  Cardiovascular: Normal rate, regular rhythm, normal heart sounds and intact distal pulses.   Pulmonary/Chest: Effort normal and breath sounds normal. No respiratory distress. Right breast exhibits no inverted nipple, no mass, no nipple discharge, no skin change and no tenderness. Left breast exhibits no inverted nipple, no mass, no nipple discharge, no skin change and no tenderness.  Abdominal: Soft. Bowel sounds are normal. She exhibits no  distension and no mass. There is no tenderness.  Musculoskeletal: Normal range of motion.  Lymphadenopathy:    She has no cervical adenopathy.  Neurological: She is alert and oriented to person, place, and time. No cranial nerve deficit.  Knee replacements so no patellar DTRs detectable  Skin: Skin is warm and dry.  Psychiatric: She has a normal mood and affect. Her behavior is normal. Judgment and thought content normal.    Labs reviewed: Basic Metabolic Panel:  Recent Labs  08/27/15 0938  NA 144  K 4.5  CL 105  CO2 23  GLUCOSE 96  BUN 14  CREATININE 0.59  CALCIUM 9.0  TSH 0.259*   Liver Function Tests:  Recent Labs  08/27/15 0938  AST 25  ALT 20  ALKPHOS 58  BILITOT 0.4  PROT 6.5  ALBUMIN 4.2   No results for input(s): LIPASE, AMYLASE in the last 8760 hours. No results for input(s): AMMONIA in the last 8760 hours. CBC:  Recent Labs  08/27/15 0938  WBC 4.0  NEUTROABS 2.1  HCT 42.1  MCV 89  PLT 210   Lipid Panel:  Recent Labs  08/27/15 0938  CHOL 158  HDL 48  LDLCALC 91  TRIG 97  CHOLHDL 3.3   Lab Results  Component Value Date   HGBA1C 5.5 08/27/2015    Procedures: EKG reviewed from 07/24/15 which showed NSR at 74bpm  Assessment/Plan 1. Medicare annual wellness visit, subsequent -sees gyn -gets mammos in December  -is up to date on vaccines   2. Hypothyroidism, unspecified hypothyroidism type - reduce thyroid medication to 120mg  daily except Wed 180mg  (armour thyroid) - Amb ref to Medical Nutrition Therapy-MNT  3. Chronic fatigue fibromyalgia syndrome - has chronic neck pain that is worse - cont tramadol, celebrex, myofascial therapy - Amb ref to Medical Nutrition Therapy-MNT  4. Degenerative disc disease, cervical -contributes to chronic neck pain--sees Dr. Oneida Alar, Dr. Brett Fairy at neuro   5. OSA on CPAP -cont cpap  6. Melena -must schedule her cscope asap  7. Slow transit constipation - must schedule cscope asap - Amb ref  to Medical Nutrition Therapy-MNT  8. Insomnia secondary to depression with anxiety -cut back on lorazepam and melatonin gradually -I think she's on too many sedating meds and it's affecting her energy in the mornings and her cognition (26/30 only on mmse)  9. Vitamin D deficiency -resume dosing she was taking of Vitamin D b/c level was still only 35  10. Obesity -worsening her chronic pain -agrees to nutrition referral to help with dietary choices for weight loss, constipation -fortunately, labs are good -meds probably don't help either - Amb ref to Medical Nutrition Therapy-MNT   Labs/tests ordered:   Orders Placed This Encounter  Procedures  . Amb ref to Medical Nutrition Therapy-MNT    Referral Priority:  Routine    Referral Type:  Consultation    Referral Reason:  Specialty Services Required    Requested Specialty:  Nutrition    Number of Visits Requested:  1  NEED B12  NEXT TIME  Next appt:  01/04/2016 med mgt (check B12, see about sleep and pain mgt)  Brayleigh Rybacki L. Aizlyn Schifano, D.O. [facility] [address], [place] [address] Cell Phone (Mon-Fri 8am-5pm):  [phone] On Call:  [phone] & follow prompts after 5pm & weekends Office Phone:  [phone] Office Fax:  [phone]

## 2015-08-31 NOTE — Patient Instructions (Addendum)
Please complete an advance directive:  Living will and health care power of attorney.  Then bring [place] a copy for your records.

## 2015-09-03 DIAGNOSIS — D126 Benign neoplasm of colon, unspecified: Secondary | ICD-10-CM | POA: Diagnosis not present

## 2015-09-03 DIAGNOSIS — G4739 Other sleep apnea: Secondary | ICD-10-CM | POA: Diagnosis not present

## 2015-09-03 DIAGNOSIS — E559 Vitamin D deficiency, unspecified: Secondary | ICD-10-CM | POA: Diagnosis not present

## 2015-09-03 DIAGNOSIS — G43909 Migraine, unspecified, not intractable, without status migrainosus: Secondary | ICD-10-CM | POA: Diagnosis not present

## 2015-09-03 DIAGNOSIS — M859 Disorder of bone density and structure, unspecified: Secondary | ICD-10-CM | POA: Diagnosis not present

## 2015-09-03 DIAGNOSIS — M797 Fibromyalgia: Secondary | ICD-10-CM | POA: Diagnosis not present

## 2015-09-03 DIAGNOSIS — Z6832 Body mass index (BMI) 32.0-32.9, adult: Secondary | ICD-10-CM | POA: Diagnosis not present

## 2015-09-03 DIAGNOSIS — E784 Other hyperlipidemia: Secondary | ICD-10-CM | POA: Diagnosis not present

## 2015-09-03 DIAGNOSIS — E038 Other specified hypothyroidism: Secondary | ICD-10-CM | POA: Diagnosis not present

## 2015-09-03 DIAGNOSIS — Z1389 Encounter for screening for other disorder: Secondary | ICD-10-CM | POA: Diagnosis not present

## 2015-09-06 NOTE — Telephone Encounter (Signed)
Discard

## 2015-09-11 ENCOUNTER — Encounter: Payer: Self-pay | Admitting: *Deleted

## 2015-09-11 DIAGNOSIS — D122 Benign neoplasm of ascending colon: Secondary | ICD-10-CM | POA: Diagnosis not present

## 2015-09-11 DIAGNOSIS — D126 Benign neoplasm of colon, unspecified: Secondary | ICD-10-CM | POA: Diagnosis not present

## 2015-09-11 DIAGNOSIS — K5730 Diverticulosis of large intestine without perforation or abscess without bleeding: Secondary | ICD-10-CM | POA: Diagnosis not present

## 2015-09-11 DIAGNOSIS — D124 Benign neoplasm of descending colon: Secondary | ICD-10-CM | POA: Diagnosis not present

## 2015-09-11 DIAGNOSIS — Z86010 Personal history of colonic polyps: Secondary | ICD-10-CM | POA: Diagnosis not present

## 2015-09-11 DIAGNOSIS — D123 Benign neoplasm of transverse colon: Secondary | ICD-10-CM | POA: Diagnosis not present

## 2015-09-11 DIAGNOSIS — K6389 Other specified diseases of intestine: Secondary | ICD-10-CM | POA: Diagnosis not present

## 2015-09-11 DIAGNOSIS — Z1211 Encounter for screening for malignant neoplasm of colon: Secondary | ICD-10-CM | POA: Diagnosis not present

## 2015-09-11 LAB — HM COLONOSCOPY

## 2015-09-14 ENCOUNTER — Other Ambulatory Visit: Payer: Self-pay | Admitting: *Deleted

## 2015-09-14 DIAGNOSIS — M79604 Pain in right leg: Secondary | ICD-10-CM | POA: Diagnosis not present

## 2015-09-14 MED ORDER — TRAMADOL HCL 50 MG PO TABS: 50.0000 mg | ORAL_TABLET | Freq: Four times a day (QID) | ORAL | Status: DC | PRN

## 2015-09-18 ENCOUNTER — Telehealth: Payer: Self-pay | Admitting: Neurology

## 2015-09-18 NOTE — Telephone Encounter (Signed)
I agree that the tramadol may be a culprit.  Doxepin and lorazepam are not helping with falls either.  Is she able to reduce her tramadol?  Can you please call her, Karena Addison?

## 2015-09-18 NOTE — Telephone Encounter (Signed)
Patient may need to reduce Tramadol. I doubt that Lunesta or Melatonin can cause her falls. I agree with calling Dr Mariea Clonts and making sure that she is cleared  cardio wise  ( Cardio-monitor for sudden falls , can be gaps in heart beats) CD

## 2015-09-18 NOTE — Telephone Encounter (Signed)
Patient called to advise Dr. Brett Fairy, she has fallen 4 times in the past 2 weeks. Please call.

## 2015-09-18 NOTE — Telephone Encounter (Signed)
I spoke to pt. She has fallen 3 times in the past two weeks while outside. Pt reports the falls as spontaneous and denies being dizzy nor lightheaded. Pt fell twice today. Pt last saw Dr. Jaynee Eagles who counseled the pt on polypharmacy which can cause falls.(Pt is taking doxepin, lunesta, melatonin, and lorazepam, and ultram). Pt denies injuries from the falls.  I asked pt if she has contacted her PCP Dr. Mariea Clonts regarding these falls, and she reports that she has not, and wants Dr. Brett Fairy to review this message and if Dr. Brett Fairy feels that she should call Dr. Mariea Clonts, pt will call her.   Pt's next follow up with Dr. Jaynee Eagles for migraines is 11/07/2015 and with Dr. Brett Fairy on 01/08/2016.  Do you recommend a sooner appt with Dr. Brett Fairy, or for pt to call PCP? Do you recommend any other interventions for this pt?

## 2015-09-19 NOTE — Telephone Encounter (Signed)
Pt returned my call. I advised her that Dr. Brett Fairy thinks she may need to reduce her tramadol and does recommend speaking to Dr. Mariea Clonts regarding her falls as well. Pt says that she takes the tramadol for neck pain and it is prescribed by Dr. Oneida Alar. Pt will call Dr. Cyndi Lennert office and Dr. Nona Dell office to discuss. Pt verbalized understanding of Dr. Edwena Felty recommendations.

## 2015-09-19 NOTE — Telephone Encounter (Signed)
I called pt to discuss. No answer, left a message asking her to call me back. 

## 2015-09-20 NOTE — Telephone Encounter (Signed)
Patient returned my call and stated she knew nothing about the phone call about the tramadol, also per pt she can not reduce the tramadol because it's for her neck pain, pt was not happy about the conversation and stated that Dr. Mariea Clonts does not prescribe the tramadol and she will discuss with Dr. Oneida Alar.

## 2015-09-20 NOTE — Telephone Encounter (Signed)
.  left message to have patient return my call.  

## 2015-09-26 ENCOUNTER — Ambulatory Visit (INDEPENDENT_AMBULATORY_CARE_PROVIDER_SITE_OTHER): Payer: Medicare Other | Admitting: Sports Medicine

## 2015-09-26 ENCOUNTER — Encounter: Payer: Self-pay | Admitting: Sports Medicine

## 2015-09-26 DIAGNOSIS — M5030 Other cervical disc degeneration, unspecified cervical region: Secondary | ICD-10-CM

## 2015-09-26 NOTE — Progress Notes (Signed)
Subjective:    Patient ID: [name] , female   DOB: 1940-03-24 , 75 y.o..   MRN: [id number]  HPI  MAURISHA MARUNA is here for neck pain follow up. Patient states that her neck pain is "98% better" and she has not had a headache in 33 days. She feels as though the Tramadol is really helping her. She is still having some difficulty with range of motion of her neck. Additionally she notes that she fell on July 9th from 1-2 porch steps and landed on her left side. She was wearing" flimsy flip flops" and holding a gallon of water in 1 hand and feels as though this may have precipitated the fall. She did not hit her head of lose consciousness. She denies any dizziness but does admit to some difficulty getting out of a chair from the sitting position, especially if the chair is low to the ground.   Past HX: includes bilat TKR She was doing stregth exercises but has not done for several mos  ROS: No unexpected weight loss, fever, chills, swelling, muscle pain, numbness/tingling, redness, otherwise see HPI    Past Medical History: Patient Active Problem List   Diagnosis Date Noted  . Chronic fatigue fibromyalgia syndrome 08/20/2015  . Melena 08/20/2015  . Acute recurrent maxillary sinusitis 08/20/2015  . Slow transit constipation 08/20/2015  . Vitamin D deficiency 08/20/2015  . Thyroid activity decreased 08/20/2015  . Cephalalgia 08/20/2015  . Preop cardiovascular exam 07/20/2015  . Subacute ethmoidal sinusitis 12/11/2014  . Chronic infection of sinus 07/12/2014  . Nasal septal ulcer 05/25/2014  . Degenerative disc disease, cervical 03/22/2014  . Hand pain, right 03/22/2014  . Deflected nasal septum 01/30/2014  . Hypertrophy of nasal turbinates 01/30/2014  . Insomnia, controlled 12/20/2013  . 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 Insomnia secondary to depression with anxiety 10/04/2013  . Chest pain 08/20/2013  . Exertional dyspnea 08/20/2013  . History of rheumatic  fever 08/20/2013  . Generalized anxiety disorder 08/09/2013  . Hypomania (mild) single episode or unspecified 08/09/2013  . Insomnia due to mental condition 08/09/2013  . OSA on CPAP 04/06/2013  . Postoperative anemia due to acute blood loss 11/09/2012  . OA (osteoarthritis) of knee 11/08/2012  . History of giardia infection 07/15/2012  . Atrophic vaginitis 07/15/2012  . Vitreous degeneration of right eye   . Nuclear sclerosis   . Right shoulder pain 06/18/2011  . Foot pain, left 06/18/2011  . DJD (degenerative joint disease) of knee 04/29/2011    Medications: reviewed   Social Hx:  reports that she is a non-smoker but has been exposed to tobacco smoke. She has never used smokeless tobacco.    Objective:   BP 121/83   Pulse 75   Ht 5\' 6"  (1.676 m)   Wt 202 lb (91.6 kg)   LMP 02/25/1995 (Approximate)   BMI 32.60 kg/m  Physical Exam  Gen: NAD, alert, cooperative with exam, well-appearing Neck: normal C-spine without erythema or swelling, no tenderness to palpation, full ROM with flexion, full ROM with extension, limited ROM with lateral bend and rotation of neck  Note posture improved Has also stopped head forward position  Rib cage along RT shows TT to compression  Strength of hip flexors and quads is decreased   Assessment & Plan:  Cervical Degenerative Disc Disease:  - Improved symptoms with the resumption of Tramadol, Celebrex, and Doxepin. Continue these medications. - Continue Isometric neck strength exercises. - Additionally, will start straight leg  raises, up and out of chair, and stationary bicycling exercises to regain lower body strength that was lost when patient was debilitated from neck pain and headaches. - Follow up in 8 weeks  Smitty Cords, MD [place], PGY-2  Visit reviewed and edited.  Ila Mcgill, MD

## 2015-09-27 ENCOUNTER — Ambulatory Visit: Payer: Self-pay | Admitting: Sports Medicine

## 2015-09-27 NOTE — Assessment & Plan Note (Signed)
I think the Cervical DJD triggered a lot of her headaches and was a factor in fatigue  Her dunctional imporvment after adding low dose tramadol - 50 bid and Doxepin at HS has been dramatic  I would hesitate to change these and advised continuing until I see her in followup  Husband confirms that her activity, mood and limitaions from headahces is dramatically improved  Reck 2 mos

## 2015-10-03 DIAGNOSIS — E038 Other specified hypothyroidism: Secondary | ICD-10-CM | POA: Diagnosis not present

## 2015-10-30 DIAGNOSIS — F331 Major depressive disorder, recurrent, moderate: Secondary | ICD-10-CM | POA: Diagnosis not present

## 2015-10-31 ENCOUNTER — Ambulatory Visit: Payer: Self-pay | Admitting: Cardiovascular Disease

## 2015-11-05 ENCOUNTER — Ambulatory Visit: Payer: Medicare Other | Admitting: Internal Medicine

## 2015-11-07 ENCOUNTER — Ambulatory Visit: Payer: Medicare Other | Admitting: Neurology

## 2015-12-07 DIAGNOSIS — H2513 Age-related nuclear cataract, bilateral: Secondary | ICD-10-CM | POA: Diagnosis not present

## 2015-12-07 DIAGNOSIS — H52203 Unspecified astigmatism, bilateral: Secondary | ICD-10-CM | POA: Diagnosis not present

## 2015-12-11 ENCOUNTER — Ambulatory Visit (INDEPENDENT_AMBULATORY_CARE_PROVIDER_SITE_OTHER): Payer: Medicare Other | Admitting: Sports Medicine

## 2015-12-11 ENCOUNTER — Encounter: Payer: Self-pay | Admitting: Sports Medicine

## 2015-12-11 DIAGNOSIS — M5030 Other cervical disc degeneration, unspecified cervical region: Secondary | ICD-10-CM

## 2015-12-11 NOTE — Progress Notes (Signed)
Subjective:  Patient ID: DEAN BUENAFE, female    DOB: 1940/12/06  Age: 75 y.o. MRN: [id number]  CC: Neck Pain Follow Up  HPI CYLEE MONCE is a 75 year old patient who presents for follow up on neck pain. Patient feels that her neck pain is much better stating that she has only has 1 migraine headache since the last visit.  Patient states the treatment thus far has all really improved her quality of life. Patient reports that while on the tramadol, she does not get bothered by it. Patient reports that icing has also been helping. Patient denies any dizziness, fever, sudden weight loss, or swelling, but reports tingling in neck.   Patient says that tingling in neck feels like electric shock and will randomly come and go for a few seconds after turning her neck to the left. Patient reports that she was been doing the neck exercises, but 3 times a week instead of daily. She reports that she has been practicing balance and sit to standing exercises as she was told to do.    Outpatient Medications Prior to Visit  Medication Sig Dispense Refill  . aspirin EC 81 MG tablet Take 81 mg by mouth daily.    . celecoxib (CELEBREX) 200 MG capsule Take 200 mg by mouth daily.    Marland Kitchen doxepin (SINEQUAN) 75 MG capsule Take 75 mg by mouth.    . Eszopiclone 3 MG TABS Take 3 mg by mouth at bedtime. Take immediately before bedtime    . levothyroxine (SYNTHROID, LEVOTHROID) 200 MCG tablet Take 200 mcg by mouth daily before breakfast.    . LORazepam (ATIVAN) 1 MG tablet Take 1 mg by mouth at bedtime.    . Melatonin 3 MG TABS Take 10 mg by mouth daily.     . traMADol (ULTRAM) 50 MG tablet Take 1 tablet (50 mg total) by mouth every 6 (six) hours as needed. 90 tablet 2  . Aspirin-Acetaminophen-Caffeine (EXCEDRIN MIGRAINE PO) Take by mouth daily.     Marland Kitchen azelastine (ASTELIN) 0.1 % nasal spray Place 2 sprays into both nostrils 2 (two) times daily. Use in each nostril as directed    . Calcium-Magnesium-Vitamin D (CALCIUM  MAGNESIUM PO) Take by mouth daily.    . Cholecalciferol (VITAMIN D-3) 1000 units CAPS Take by mouth daily.    . fluticasone (FLONASE) 50 MCG/ACT nasal spray Place 2 sprays into both nostrils daily.    . valACYclovir (VALTREX) 500 MG tablet Take 500 mg by mouth 2 (two) times daily. Patient taking at onset of symptoms    . polyethylene glycol (MIRALAX / GLYCOLAX) packet Take 17 g by mouth daily.     No facility-administered medications prior to visit.   Soc Hx Retired Lives with husband  ROS Review of Systems  Musculoskeletal:       Neck Pain      Neck Tingling  Refer to HPI for pertinent negatives and positives Chronic fibromyalgia sxs with diffuse pain and lack of energy No weakness in upper extremtihy Insomnia - chronic challenge but improved on doxepin  Objective:  BP 125/78   Pulse 93   LMP 02/25/1995 (Approximate)   Physical Exam  Constitutional: She appears well-developed and well-nourished.  Musculoskeletal:  Neck: No swelling, lesions, or deformities. No tenderness. Limited ROM with rotation of the neck, otherwise normal ROM with extension and flexion.    Better posture Less trap spasm Norm motion of both shoulders and arms  Mood and affect positive  Assessment &  Plan:   1. Neck Pain Secondary to Cervical Degenerative Disc Disease Patient reports large improvement in her quality of life with treatment. Patient recently came back from 23 day trip from [place] and is having trouble falling getting back on normal sleep schedule. Patient will continue her current regimen of Tramadol, Celebrex, and Doxepin as it seems to be effective. Would like to start getting her off Lorazepam slowly while increasing Doxepin dose. Will follow up in 1 month after patient resumes normal sleep schedule and readjust medications at that visit.   Follow-up: No Follow-up on file.   Salvadore Dom, Medical Student

## 2015-12-11 NOTE — Assessment & Plan Note (Signed)
This is much improved on tramadol Usually 2 per day Can take up to 4 if she starts to get a migraine  Doxepin heps with muscle relaxation, mood and sleep Possibly may want to increase to 100 at night Add 25 during day or bid Try to wean off lunesta and ativan in future  Reck 6 wks

## 2015-12-13 ENCOUNTER — Other Ambulatory Visit: Payer: Self-pay | Admitting: *Deleted

## 2015-12-13 MED ORDER — TRAMADOL HCL 50 MG PO TABS: 50.0000 mg | ORAL_TABLET | Freq: Four times a day (QID) | ORAL | 2 refills | Status: DC | PRN

## 2015-12-19 DIAGNOSIS — E784 Other hyperlipidemia: Secondary | ICD-10-CM | POA: Diagnosis not present

## 2015-12-19 DIAGNOSIS — G4739 Other sleep apnea: Secondary | ICD-10-CM | POA: Diagnosis not present

## 2015-12-19 DIAGNOSIS — Z6833 Body mass index (BMI) 33.0-33.9, adult: Secondary | ICD-10-CM | POA: Diagnosis not present

## 2015-12-19 DIAGNOSIS — E038 Other specified hypothyroidism: Secondary | ICD-10-CM | POA: Diagnosis not present

## 2015-12-19 DIAGNOSIS — E559 Vitamin D deficiency, unspecified: Secondary | ICD-10-CM | POA: Diagnosis not present

## 2015-12-19 DIAGNOSIS — M797 Fibromyalgia: Secondary | ICD-10-CM | POA: Diagnosis not present

## 2015-12-19 DIAGNOSIS — G43909 Migraine, unspecified, not intractable, without status migrainosus: Secondary | ICD-10-CM | POA: Diagnosis not present

## 2015-12-20 ENCOUNTER — Other Ambulatory Visit: Payer: Self-pay | Admitting: *Deleted

## 2015-12-20 MED ORDER — DOXEPIN HCL 100 MG PO CAPS: 100.0000 mg | ORAL_CAPSULE | Freq: Every day | ORAL | 1 refills | Status: DC

## 2015-12-20 NOTE — Progress Notes (Signed)
New 100mg  tabs called in of the Doxepin for pt to start taking at night.  Keep the 25mg  tabs of Doxepin because we may add 25mg  during the day or BID later in the 100mg  qhs are not effective.

## 2015-12-28 LAB — TSH: TSH: 0.02 u[IU]/mL — AB (ref 0.41–5.90)

## 2015-12-28 LAB — LIPID PANEL
Cholesterol: 150 mg/dL (ref 0–200)
HDL: 47 mg/dL (ref 35–70)
LDL Cholesterol: 69 mg/dL
Triglycerides: 168 mg/dL — AB (ref 40–160)

## 2015-12-28 LAB — BASIC METABOLIC PANEL
BUN: 19 mg/dL (ref 4–21)
Creatinine: 0.7 mg/dL (ref 0.5–1.1)
Glucose: 96 mg/dL
Potassium: 4.4 mmol/L (ref 3.4–5.3)
Sodium: 143 mmol/L (ref 137–147)

## 2016-01-04 ENCOUNTER — Ambulatory Visit (INDEPENDENT_AMBULATORY_CARE_PROVIDER_SITE_OTHER): Payer: Medicare Other | Admitting: Internal Medicine

## 2016-01-04 ENCOUNTER — Telehealth: Payer: Self-pay | Admitting: Neurology

## 2016-01-04 ENCOUNTER — Encounter: Payer: Self-pay | Admitting: Internal Medicine

## 2016-01-04 VITALS — BP 122/74 | HR 76 | Temp 97.7°F | Ht 66.0 in | Wt 197.0 lb

## 2016-01-04 DIAGNOSIS — M5030 Other cervical disc degeneration, unspecified cervical region: Secondary | ICD-10-CM | POA: Diagnosis not present

## 2016-01-04 DIAGNOSIS — G43111 Migraine with aura, intractable, with status migrainosus: Secondary | ICD-10-CM

## 2016-01-04 DIAGNOSIS — G9332 Myalgic encephalomyelitis/chronic fatigue syndrome: Secondary | ICD-10-CM

## 2016-01-04 DIAGNOSIS — R296 Repeated falls: Secondary | ICD-10-CM | POA: Diagnosis not present

## 2016-01-04 DIAGNOSIS — R232 Flushing: Secondary | ICD-10-CM

## 2016-01-04 DIAGNOSIS — G4733 Obstructive sleep apnea (adult) (pediatric): Secondary | ICD-10-CM

## 2016-01-04 DIAGNOSIS — Z9989 Dependence on other enabling machines and devices: Secondary | ICD-10-CM | POA: Diagnosis not present

## 2016-01-04 DIAGNOSIS — M797 Fibromyalgia: Secondary | ICD-10-CM | POA: Diagnosis not present

## 2016-01-04 DIAGNOSIS — Z79899 Other long term (current) drug therapy: Secondary | ICD-10-CM | POA: Diagnosis not present

## 2016-01-04 DIAGNOSIS — R5382 Chronic fatigue, unspecified: Secondary | ICD-10-CM

## 2016-01-04 NOTE — Telephone Encounter (Signed)
Pt called to advise she is wanting to get a new CPAP machine. Does she need to bring the current CPAP with her to the appt on 01/08/16. Please call

## 2016-01-04 NOTE — Progress Notes (Signed)
Location:  [facility] clinic Provider:  Shayle Donahoo L. Mariea Clonts, D.O., C.M.D.  Code Status: full code Goals of Care:  Advanced Directives 08/31/2015  Does patient have an advance directive? No  Type of Advance Directive -  Copy of advanced directive(s) in chart? -  Would patient like information on creating an advanced directive? -  Pre-existing out of facility DNR order (yellow form or pink MOST form) -  Some encounter information is confidential and restricted. Go to Review Flowsheets activity to see all data.    Chief Complaint  Patient presents with  . Medical Management of Chronic Issues    4 month follow-up, labs completed by Dr.South. + Fall Risk      HPI: Patient is a 75 y.o. female seen today for medical management of chronic diseases.    Has now fallen 6 times.  Two weeks ago was the most recent time.  She was wearing birkenstock sandals and walking rapidly, the tip of her sandal caught in the crack in the sidewalk.  She landed on her left side, cut her hand, bruised her left shoulder and elbow.    TSH was low but other labs unremarkable with Dr. Forde Dandy.  Sees Dr. Oneida Alar for her headaches.  Still having them.  Takes tramadol twice a day.  Lays down, ices her neck.  Gets nausea with them and having light sensitivity.  Used to be able to continue going unless really bad.   Her husband wanted to come b/c he thinks she is very negative.  They have been having guests or traveling for several weeks now.  She says they are always very busy and nonstop.  Feels like it's way too much.  Says she's not an energizer bunny and has her fibro and arthritis.  Is tired.    She does see Dr. Casimiro Needle also.  She had been on lexapro for 15 years for depression.  Another medication made her nauseated.  She has sleep apnea.  She has difficulty with sleep from this.  Taking 10mg  melatonin at 8pm.  At 9pm, she takes the doxepin 100mg  at bedtime.  She had a headache before that visit for 2 mos before seeing Dr.  Oneida Alar.  She has been using the tramadol since that time for the headaches.  She has seen Dr. Carloyn Manner neurosurgeon about the headaches after an MRI of her neck.  He ordered PT the first time and it helped, but the second time it made it worse.  She does not want neck surgery.  She has seen numerous neurologists and neurosurgeons.      She has lost 4 lbs even after [place] where she ate and drank a lot.  Is trying to go back to water aerobics and walking.      She will get soaking wet with hot flashes like she had during menopause--happens a few times per week.  Endocrine and gyn said they had no suggestions.   Past Medical History:  Diagnosis Date  . 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 Constipation   . Depression   . Fibromyalgia 8/98   [place]  . 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 Impaired hearing    left ear, wears hearing aids  . Insomnia    circadian rhythm component  . Insomnia   . Migraine 10/88   [place]  . Nuclear sclerosis   . OSA on CPAP 2/07   uses cpap setting of 10  . Osteoarthritis 2007   [place]  .  Pain last week   left under breast pain   . Plantar fasciitis   . Scarlet fever as child  . Thyroid disorder   . Vitreous degeneration of right eye     Past Surgical History:  Procedure Laterality Date  . BREAST BIOPSY Left 6/00   [place]  . DE QUERVAIN'S RELEASE Right 10/97   [place]  . left breast biopsy    . ROOT CANAL  02/11/2012  . TONSILLECTOMY  age 56  . TONSILLECTOMY AND ADENOIDECTOMY  1952  . 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 TOTAL KNEE ARTHROPLASTY Right 11/08/2012   Procedure: RIGHT TOTAL KNEE ARTHROPLASTY;  Surgeon: Gearlean Alf, MD;  Location: [facility];  Service: Orthopedics;  Laterality: Right;  . TUBAL LIGATION      Allergies  Allergen Reactions  . Penicillins Anaphylaxis    Tongue swelling  . Codeine Nausea Only  . Provigil [Modafinil]   . Seroquel [Quetiapine Fumarate]   . Xyrem [Sodium Oxybate]     hallucination    . Ciprofloxacin Rash  . Monistat [Miconazole] Rash  . Terazol [Terconazole] Rash      Medication List       Accurate as of 01/04/16 11:51 AM. Always use your most recent med list.          aspirin EC 81 MG tablet Take 81 mg by mouth daily.   azelastine 0.1 % nasal spray Commonly known as:  ASTELIN Place 2 sprays into both nostrils 2 (two) times daily. Use in each nostril as directed   CALCIUM MAGNESIUM PO Take by mouth daily.   celecoxib 200 MG capsule Commonly known as:  CELEBREX Take 200 mg by mouth daily.   doxepin 100 MG capsule Commonly known as:  SINEQUAN Take 1 capsule (100 mg total) by mouth at bedtime.   Eszopiclone 3 MG Tabs Take 3 mg by mouth at bedtime. Take immediately before bedtime   EXCEDRIN MIGRAINE PO Take by mouth daily.   fluticasone 50 MCG/ACT nasal spray Commonly known as:  FLONASE Place 2 sprays into both nostrils daily.   levothyroxine 200 MCG tablet Commonly known as:  SYNTHROID, LEVOTHROID Take 200 mcg by mouth daily before breakfast.   LORazepam 1 MG tablet Commonly known as:  ATIVAN Take 2 by mouth at bedtime   Melatonin 3 MG Tabs Take 10 mg by mouth daily.   traMADol 50 MG tablet Commonly known as:  ULTRAM Take 1 tablet (50 mg total) by mouth every 6 (six) hours as needed.   UNABLE TO FIND Med Name: 5 HTP mood & relaxation 100 mg 2 by mouth daily   valACYclovir 500 MG tablet Commonly known as:  VALTREX Take 500 mg by mouth 2 (two) times daily. Patient taking at onset of symptoms   Vitamin D-3 1000 units Caps Take by mouth daily.       Review of Systems:  Review of Systems  Constitutional: Positive for malaise/fatigue and weight loss. Negative for chills and fever.  HENT: Negative for congestion and hearing loss.   Eyes: Negative for blurred vision.  Respiratory: Negative for cough and shortness of breath.   Cardiovascular: Negative for chest pain, palpitations and leg swelling.  Gastrointestinal: Negative for  abdominal pain, blood in stool and melena.  Genitourinary: Negative for dysuria.  Musculoskeletal: Positive for falls and myalgias.  Skin: Negative for itching and rash.  Neurological: Positive for headaches. Negative for dizziness and weakness.  Endo/Heme/Allergies:       Hot flashes  Psychiatric/Behavioral: Positive  for depression. The patient has insomnia.     Health Maintenance  Topic Date Due  . PNA vac Low Risk Adult (2 of 2 - PCV13) 11/01/2013  . MAMMOGRAM  02/22/2016  . COLONOSCOPY  09/11/2018  . TETANUS/TDAP  10/09/2020  . DEXA SCAN  Completed  . ZOSTAVAX  Completed    Physical Exam: Vitals:   01/04/16 1129  BP: 122/74  Pulse: 76  Temp: 97.7 F (36.5 C)  TempSrc: Oral  SpO2: 95%  Weight: 197 lb (89.4 kg)  Height: 5\' 6"  (1.676 m)   Body mass index is 31.8 kg/m. Physical Exam  Constitutional: She is oriented to person, place, and time. She appears well-developed and well-nourished. No distress.  Cardiovascular: Normal rate, regular rhythm, normal heart sounds and intact distal pulses.   Pulmonary/Chest: Effort normal and breath sounds normal. No respiratory distress.  Abdominal: Soft. Bowel sounds are normal.  Musculoskeletal:  Decreased rotation to right of her neck vs. Left and also decreased sidebending right, some tenderness of that side, as well, of paravertebral muscles  Neurological: She is alert and oriented to person, place, and time.  Skin: Skin is warm and dry.    Labs reviewed: Basic Metabolic Panel:  Recent Labs  08/27/15 0938 12/28/15  NA 144 143  K 4.5 4.4  CL 105  --   CO2 23  --   GLUCOSE 96  --   BUN 14 19  CREATININE 0.59 0.7  CALCIUM 9.0  --   TSH 0.259* 0.02*   Liver Function Tests:  Recent Labs  08/27/15 0938  AST 25  ALT 20  ALKPHOS 58  BILITOT 0.4  PROT 6.5  ALBUMIN 4.2   No results for input(s): LIPASE, AMYLASE in the last 8760 hours. No results for input(s): AMMONIA in the last 8760 hours. CBC:  Recent  Labs  08/27/15 0938  WBC 4.0  NEUTROABS 2.1  HCT 42.1  MCV 89  PLT 210   Lipid Panel:  Recent Labs  08/27/15 0938 12/28/15  CHOL 158 150  HDL 48 47  LDLCALC 91 69  TRIG 97 168*  CHOLHDL 3.3  --    Lab Results  Component Value Date   HGBA1C 5.5 08/27/2015    Assessment/Plan 1. Chronic fatigue fibromyalgia syndrome -unfortunately this diagnosis seems to require multiple meds to control symptoms, but these in combination increase risk of geriatric syndromes including falls, confusion which she's starting to encounter  2. Degenerative disc disease, cervical -seems to clearly be a major source of her headaches -she is on high doses of medications to manage this including some herbal 5HTP 100mg  po bid, tramadol, doxepin, celebrex, excedrin migraine -discussed that tramadol, non FDA approved herbals, and doxepin are not recommended in geriatric patients due to increased falls and confusion--more than 6 mg is not recommended and she is on 100mg  -celebrex technically isn't either being an nsaid, but, if I were to pick one to leave her on at this point, that would be it -the doxepin could also be helping her depression (though she still seems to be dissatisfied/unhappy with her current lifestyle)--SSRIs also will increase fall risk and cause weight gain -encouraged exercise, gentle massage/chiropractic visits and gradually coming off these meds  3. OSA on CPAP -cont this which should help with sleep and make her have more energy during the day  4. Intractable migraine with aura with status migrainosus -due to cervical spine disease  -she saw neuro and actual migraine meds were recommended, but she did not  follow up due to cost and concerns about sedation she encountered with previous similar meds  5. Hot flashes -unclear cause but may be due to new herbal in combination with the TCA doxepin b/c it looks like it's a serotonin agonist   6.  Polypharmacy: -agree with neuro that she  should get off lunesta -also would like her to taper off doxepin and tramadol if possible -she would probably have more success losing weight, fall less, and feel better overall  7.  Falls frequently: -she does not appear to have a medical reason to be falling, only iatrogenic reasons so we need to work on this and try using more conservative non-medication solutions to her problems  Labs/tests ordered:  No orders of the defined types were placed in this encounter. Dr. Forde Dandy just did all of her labs   Next appt:  02/13/2016   Jamice Carreno L. Han Lysne, D.O. [facility] [address], [place] [address] Cell Phone (Mon-Fri 8am-5pm):  [phone] On Call:  [phone] & follow prompts after 5pm & weekends Office Phone:  [phone] Office Fax:  [phone]

## 2016-01-04 NOTE — Patient Instructions (Addendum)
Please bring [place] a copy of your living will and health care power of attorney.  Try to walk 4 days per week 3 miles each.  You could use the recumbent bike the other days. Also continue to go to water aerobics.    See Dr. Teryl Lucy at [facility] to see if he can help with your pains and headaches.

## 2016-01-04 NOTE — Telephone Encounter (Signed)
Left VM with patient: We can usual replace a CPAP machine older than 5 years or broken machines, I will review her compliance and can order a new machine in our upcoming  Visit. She had mild apnea, but coud get much better sleep on CPAP, has in the past been highly compliant. CD

## 2016-01-07 DIAGNOSIS — M9901 Segmental and somatic dysfunction of cervical region: Secondary | ICD-10-CM | POA: Diagnosis not present

## 2016-01-07 DIAGNOSIS — M546 Pain in thoracic spine: Secondary | ICD-10-CM | POA: Diagnosis not present

## 2016-01-07 DIAGNOSIS — M9902 Segmental and somatic dysfunction of thoracic region: Secondary | ICD-10-CM | POA: Diagnosis not present

## 2016-01-07 DIAGNOSIS — M9900 Segmental and somatic dysfunction of head region: Secondary | ICD-10-CM | POA: Diagnosis not present

## 2016-01-07 DIAGNOSIS — G441 Vascular headache, not elsewhere classified: Secondary | ICD-10-CM | POA: Diagnosis not present

## 2016-01-08 ENCOUNTER — Encounter: Payer: Self-pay | Admitting: Neurology

## 2016-01-08 ENCOUNTER — Ambulatory Visit (INDEPENDENT_AMBULATORY_CARE_PROVIDER_SITE_OTHER): Payer: Medicare Other | Admitting: Neurology

## 2016-01-08 VITALS — BP 130/72 | HR 68 | Resp 20 | Ht 66.0 in | Wt 199.0 lb

## 2016-01-08 DIAGNOSIS — F5105 Insomnia due to other mental disorder: Secondary | ICD-10-CM | POA: Diagnosis not present

## 2016-01-08 DIAGNOSIS — G441 Vascular headache, not elsewhere classified: Secondary | ICD-10-CM | POA: Diagnosis not present

## 2016-01-08 DIAGNOSIS — Z9989 Dependence on other enabling machines and devices: Secondary | ICD-10-CM | POA: Diagnosis not present

## 2016-01-08 DIAGNOSIS — M9901 Segmental and somatic dysfunction of cervical region: Secondary | ICD-10-CM | POA: Diagnosis not present

## 2016-01-08 DIAGNOSIS — G4733 Obstructive sleep apnea (adult) (pediatric): Secondary | ICD-10-CM | POA: Diagnosis not present

## 2016-01-08 DIAGNOSIS — M9900 Segmental and somatic dysfunction of head region: Secondary | ICD-10-CM | POA: Diagnosis not present

## 2016-01-08 DIAGNOSIS — G478 Other sleep disorders: Secondary | ICD-10-CM | POA: Diagnosis not present

## 2016-01-08 DIAGNOSIS — M546 Pain in thoracic spine: Secondary | ICD-10-CM | POA: Diagnosis not present

## 2016-01-08 DIAGNOSIS — M9902 Segmental and somatic dysfunction of thoracic region: Secondary | ICD-10-CM | POA: Diagnosis not present

## 2016-01-08 MED ORDER — LORAZEPAM 1 MG PO TABS: 1.0000 mg | ORAL_TABLET | Freq: Every day | ORAL | 5 refills | Status: DC

## 2016-01-08 MED ORDER — ESZOPICLONE 3 MG PO TABS: 3.0000 mg | ORAL_TABLET | Freq: Every day | ORAL | 3 refills | Status: DC

## 2016-01-08 NOTE — Progress Notes (Signed)
[facility]                                                                                                                                                            SLEEP CLINIC NOTE                                                                                                                                                                    Provider:  Dr Brett Fairy, MD  Referring Provider: Lawerance Cruel, MD Primary Care Physician:  Hollace Kinnier, DO  ET:[id number] inquired if she qualifies for a new CPAP machine.  I have pleasure of seeing [name] today on 01/08/2016 and I scheduled revisit. 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 Over the last 10 years she has steadily gained weight, although she was able to reduce over 60 pounds by being in on the  [employer]. This required the intake of multiple supplements. She also tried a supplement that a friend had given her and responded with hypomania actually she developed mania and needed to be treated. She is on polypharmacy as pain medication, insomnia medication and medication for bipolar disorder have all been added over the last 5 years.  She reports that the CPAP machine works and it is at this time for years old. As his machine has been covered by Medicare she cannot replace its until it is about 75 years of age. She does sleep better usually with CPAP. She recently went patient on [place] she only had one headache in a period of over 3 weeks. After returning to [facility] her headaches have resumed. She has a very disturbed sleep cycle since returning, and stated that this lorazepam and Lunesta she was still not able to find sleep in a reasonable time. Part of her medication as provided by Dr. Andreas Blower, and part by Dr. Casimiro Needle.  Tramadol has  been prescribed by Dr. Oneida Alar, Doxepin was prescribed by Dr. Casimiro Needle- Dr. Oneida Alar adjusted the dose to address neck pain as well as insomnia. Melatonin is not a respiratory suppressant or addictive drug. My goal today is to see if the patient could take higher dose of Lorazepam and if he could make the [facility] unnecessary rather than vice versa. We were able today to review the CPAP compliance which is 90% and very good, his average user time of 8 hours and 21 minutes, CPAP is set at 10 cm water pressure and AHI is 1.8, there are no significant air leaks noted. The patient can continue at the current settings and with the current interface.   Dr. Ferdinand Lango note:  Interval history: Migraines were getting more frequent and more painful. No headaches now for a week. Husband here with her. Also more pain in the neck. She has had a daily headache since April 22nd, daily migraines. Combined with heavy pollen and allergies, She was non compliant with medication and went off the medication. She has had neck pain due to degenerative arthritis. She has had neck pain, migraines, insomnia, it has been a "mess" per husband. ENT put her back in sinus medication which has helped. A week ago she was placed back on her neck pain medication. Doing better. For a week her headaches have been better. Headaches have been in the frontal area. She has nausea with the migraines. She take lorazepam, lunesta, tramadol, doxepin, melatonin all at night. Discussed polypharmacy, she really needs to meet with primary care to manage her medications, this combination can cause respiratory failure and confusion, increased risk of falls and other significant sequelae. Advise not to take all at once and see physicians who prescribed all this at night to let them know she has multiple meds now so they are aware of everything she takes.  HPI: [name] is a 75 y.o. female here as a follow up. history of generalized anxiety disorder, hypomania and  insomnia.  She started having migraines when she was 70. The migraine is in the forehead. Bilateral. Pounding, sensitive to sound and light. Her vision gets wavy before the headache. She saw an ENT at [facility] and diagnosed her with sinusitis and placed her on antibiotics. She is having 10 a month, increased in the last few months. Previously she only had one migraine a month. She still feels like her sinuses are involved with pressure and pain. She uses a cpap at night and that is affecting her sinuses as well. Her face feels full, head feels full, pressure in the sinuses, can't breath through nostril, ears feel full. She has been traveling extensively within the [place] and overseas. Her ears have been popping for 2 weeks. She last traveled to [place], [place] earlier this month and came back because she was sick. She has nausea and vomiting. She is currently in the middle of her antibiotic regimen.  Reviewed notes, labs and imaging from outside physicians, which showed: Patient has OSA on CPAP and follows with Dr Brett Fairy. She has a history of mania in response to medications.   Review of Systems: Patient complains of symptoms per HPI as well as the following symptoms: No CP, no SOB. Fatigue, light sensitivity, joint pain, back pain, aching muscles, muscle cramps, neck pain, neck stiffness, headaches, agitation, agitation, nervous, decr concentration. Pertinent negatives per HPI. All others negative.  Social History   Social History  . Marital status: Married  Spouse name: [name]  . Number of children: 0  . Years of education: 14   Occupational History  . Not on file.   Social History Main Topics  . Smoking status: Passive Smoke Exposure - Never Smoker  . Smokeless tobacco: Never Used  . Alcohol use 0.0 oz/week     Comment: 1 glass - occas.  . Drug use: No  . Sexual activity: Not Currently    Partners: Male    Birth control/ protection: Post-menopausal   Other Topics Concern    . Not on file   Social History Narrative   Patient is married [name]).   Patient drinks 2-4 cups of coffee daily.   Patient is retired.   Patient has two years of college.   Patient is right-handed.                   Family History  Problem Relation Age of Onset  . Breast cancer Mother   . Aneurysm Father   . Migraines Father   . Breast cancer Maternal Aunt   . Breast cancer Maternal Aunt   . Breast cancer Maternal Aunt   . Breast cancer Maternal Aunt   . High blood pressure Brother   . Melanoma Brother   . Kidney cancer Brother     Lesion on kidney  . Heart disease      Grandmother    Past Medical History:  Diagnosis Date  . 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 Constipation   . Depression   . Fibromyalgia 8/98   [place]  . 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 Impaired hearing    left ear, wears hearing aids  . Insomnia    circadian rhythm component  . Insomnia   . Migraine 10/88   [place]  . Nuclear sclerosis   . OSA on CPAP 2/07   uses cpap setting of 10  . Osteoarthritis 2007   [place]  . Pain last week   left under breast pain   . Plantar fasciitis   . Scarlet fever as child  . Thyroid disorder   . Vitreous degeneration of right eye     Past Surgical History:  Procedure Laterality Date  . BREAST BIOPSY Left 6/00   [place]  . DE QUERVAIN'S RELEASE Right 10/97   [place]  . left breast biopsy    . ROOT CANAL  02/11/2012  . TONSILLECTOMY  age 45  . TONSILLECTOMY AND ADENOIDECTOMY  1952  . 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 TOTAL KNEE ARTHROPLASTY Right 11/08/2012   Procedure: RIGHT TOTAL KNEE ARTHROPLASTY;  Surgeon: Gearlean Alf, MD;  Location: [facility];  Service: Orthopedics;  Laterality: Right;  . TUBAL LIGATION      Current Outpatient Prescriptions  Medication Sig Dispense Refill  . aspirin EC 81 MG tablet Take 81 mg by mouth daily.    . Aspirin-Acetaminophen-Caffeine (EXCEDRIN MIGRAINE PO) Take by mouth daily.      Marland Kitchen azelastine (ASTELIN) 0.1 % nasal spray Place 2 sprays into both nostrils 2 (two) times daily. Use in each nostril as directed    . Calcium-Magnesium-Vitamin D (CALCIUM MAGNESIUM PO) Take by mouth daily.    . celecoxib (CELEBREX) 200 MG capsule Take 200 mg by mouth daily.    . Cholecalciferol (VITAMIN D-3) 1000 units CAPS Take by mouth daily.    Marland Kitchen doxepin (SINEQUAN) 100 MG capsule Take 1 capsule (100 mg total) by mouth at bedtime. 90 capsule 1  . Eszopiclone  3 MG TABS Take 3 mg by mouth at bedtime. Take immediately before bedtime    . fluticasone (FLONASE) 50 MCG/ACT nasal spray Place 2 sprays into both nostrils daily.    Marland Kitchen levothyroxine (SYNTHROID, LEVOTHROID) 200 MCG tablet Take 200 mcg by mouth daily before breakfast.    . LORazepam (ATIVAN) 1 MG tablet Take 2 by mouth at bedtime    . Melatonin 3 MG TABS Take 10 mg by mouth daily.     . traMADol (ULTRAM) 50 MG tablet Take 1 tablet (50 mg total) by mouth every 6 (six) hours as needed. 90 tablet 2  . UNABLE TO FIND Med Name: 5 HTP mood & relaxation 100 mg 2 by mouth daily    . valACYclovir (VALTREX) 500 MG tablet Take 500 mg by mouth 2 (two) times daily. Patient taking at onset of symptoms     No current facility-administered medications for this visit.     Allergies as of 01/08/2016 - Review Complete 01/04/2016  Allergen Reaction Noted  . Penicillins Anaphylaxis 04/29/2011  . Codeine Nausea Only 04/29/2011  . Provigil [modafinil]  03/24/2013  . Seroquel [quetiapine fumarate]  07/20/2014  . Xyrem [sodium oxybate]  06/18/2011  . Ciprofloxacin Rash 07/01/2012  . Monistat [miconazole] Rash 07/01/2012  . Terazol [terconazole] Rash 07/01/2012    Vitals: LMP 02/25/1995 (Approximate)  Last Weight:  Wt Readings from Last 1 Encounters:  01/04/16 197 lb (89.4 kg)   Last Height:   Ht Readings from Last 1 Encounters:  01/04/16 5\' 6"  (1.676 m)    Exam: Gen: NAD, conversant, well nourised, well groomed  CV: RRR, no  MRG. No Carotid Bruits. No peripheral edema, warm, nontender Eyes: Conjunctivae clear without exudates or hemorrhage  Neuro: Detailed Neurologic Exam  Speech:  Speech is normal; fluent and spontaneous with normal comprehension.  Cognition:  The patient is oriented to person, place, and time;   recent and remote memory intact;   language fluent;   normal attention, concentration,   fund of knowledge  Cranial Nerves:  The pupils are equal, round, and reactive to light. The fundi are normal and spontaneous venous pulsations are present. Visual fields are full to finger confrontation. Extraocular movements are intact. Trigeminal sensation is intact and the muscles of mastication are normal. The face is symmetric. The palate elevates in the midline. Hearing intact. Voice is normal. Shoulder shrug is normal. The tongue has normal motion without fasciculations.   Gait:  Heel-toe and tandem gait are normal.   Motor Observation:  No asymmetry, no atrophy, and no involuntary movements noted. Tone:  Normal muscle tone, except for the cervical paraspinal muscles and shoulder. Very tense ! .   Posture:  Posture is normal/ erect   Strength:  Strength is V/V in the upper and lower limbs.     MIgraine prevention.  To prevent or relieve headaches, try the following: Cool Compress. Lie down and place a cool compress on your head.  Avoid headache triggers. If certain foods or odors seem to have triggered your migraines in the past, avoid them. A headache diary might help you identify triggers.  Include physical activity in your daily routine. Try a daily walk or other moderate aerobic exercise.  Manage stress. Find healthy ways to cope with the stressors, such as delegating tasks on your to-do list.  Practice relaxation techniques. Try deep breathing, yoga, massage and visualization.  Eat regularly. Eating regularly scheduled meals and maintaining a healthy  diet might help prevent headaches. Also, drink plenty  of fluids.  Follow a regular sleep schedule. Sleep deprivation might contribute to headaches Consider biofeedback. With this mind-body technique, you learn to control certain bodily functions - such as muscle tension, heart rate and blood pressure - to prevent headaches or reduce headache pain. Meditation - helps with insomnia.  Continue using her CPAP, you have excellent compliance and a very good result and a 10 cm water pressure with a residual AHI of 1.8  I will forward today's office notes to Dr. Oneida Alar as well as to Dr. Lora Paula. I would like for Dr. Casimiro Needle to consider weaning you off Lunesta.  Insomnia has been a trigger in bipolar exacerbation.  Will certainly helping achieving this goal ; you can stay on doxepin and lorazepam for the time being.     Larey Seat, [facility] [address] [place], [place] [address]  Phone [phone] Fax [phone]  A total of 30 minutes was spent face-to-face with this patient. Over half this time was spent on counseling patient on the insomnia  diagnosis and different diagnostic and therapeutic options available.

## 2016-01-08 NOTE — Patient Instructions (Addendum)
    Our ultimate goal is to wean off Lunesta, which Dr Casimiro Needle  prescribed.  You currently take at 3 mg at night and use doxepin and possibly a benzodiazepine as sleep inducer which also helps with the reduction of neck pain.   I am concerned that insomnia could send you into severe depression or mania as has happened before sleep deprivation. However it is concerning that usually higher and higher medication doses are needed to induce sleep over time. Dr. Casimiro Needle will be addressing this .   I would like to try some meditation audio tapes to help bridge over the period of sleep latency and help decrease the anxiety associated with it.   I would like for you not to take Lunesta, but keep taking doxepin as well as Ativan. I will not change her tramadol medication as it is a pain medication prescribed by Dr.Fields and I consider melatonin not dangerous in any of these combinations.  A new CPAP is not yet due , but can be ordered in the next year.   Vanesa Renier, MD

## 2016-01-09 DIAGNOSIS — M9901 Segmental and somatic dysfunction of cervical region: Secondary | ICD-10-CM | POA: Diagnosis not present

## 2016-01-09 DIAGNOSIS — G441 Vascular headache, not elsewhere classified: Secondary | ICD-10-CM | POA: Diagnosis not present

## 2016-01-09 DIAGNOSIS — M546 Pain in thoracic spine: Secondary | ICD-10-CM | POA: Diagnosis not present

## 2016-01-09 DIAGNOSIS — M9902 Segmental and somatic dysfunction of thoracic region: Secondary | ICD-10-CM | POA: Diagnosis not present

## 2016-01-09 DIAGNOSIS — M9900 Segmental and somatic dysfunction of head region: Secondary | ICD-10-CM | POA: Diagnosis not present

## 2016-01-10 DIAGNOSIS — M546 Pain in thoracic spine: Secondary | ICD-10-CM | POA: Diagnosis not present

## 2016-01-10 DIAGNOSIS — G441 Vascular headache, not elsewhere classified: Secondary | ICD-10-CM | POA: Diagnosis not present

## 2016-01-10 DIAGNOSIS — M9901 Segmental and somatic dysfunction of cervical region: Secondary | ICD-10-CM | POA: Diagnosis not present

## 2016-01-10 DIAGNOSIS — M9900 Segmental and somatic dysfunction of head region: Secondary | ICD-10-CM | POA: Diagnosis not present

## 2016-01-10 DIAGNOSIS — M9902 Segmental and somatic dysfunction of thoracic region: Secondary | ICD-10-CM | POA: Diagnosis not present

## 2016-01-14 DIAGNOSIS — M9901 Segmental and somatic dysfunction of cervical region: Secondary | ICD-10-CM | POA: Diagnosis not present

## 2016-01-14 DIAGNOSIS — M546 Pain in thoracic spine: Secondary | ICD-10-CM | POA: Diagnosis not present

## 2016-01-14 DIAGNOSIS — G441 Vascular headache, not elsewhere classified: Secondary | ICD-10-CM | POA: Diagnosis not present

## 2016-01-14 DIAGNOSIS — M9902 Segmental and somatic dysfunction of thoracic region: Secondary | ICD-10-CM | POA: Diagnosis not present

## 2016-01-14 DIAGNOSIS — M9900 Segmental and somatic dysfunction of head region: Secondary | ICD-10-CM | POA: Diagnosis not present

## 2016-01-21 DIAGNOSIS — M9902 Segmental and somatic dysfunction of thoracic region: Secondary | ICD-10-CM | POA: Diagnosis not present

## 2016-01-21 DIAGNOSIS — M9900 Segmental and somatic dysfunction of head region: Secondary | ICD-10-CM | POA: Diagnosis not present

## 2016-01-21 DIAGNOSIS — M9901 Segmental and somatic dysfunction of cervical region: Secondary | ICD-10-CM | POA: Diagnosis not present

## 2016-01-21 DIAGNOSIS — M546 Pain in thoracic spine: Secondary | ICD-10-CM | POA: Diagnosis not present

## 2016-01-21 DIAGNOSIS — G441 Vascular headache, not elsewhere classified: Secondary | ICD-10-CM | POA: Diagnosis not present

## 2016-01-22 ENCOUNTER — Ambulatory Visit (INDEPENDENT_AMBULATORY_CARE_PROVIDER_SITE_OTHER): Payer: Medicare Other | Admitting: Sports Medicine

## 2016-01-22 ENCOUNTER — Other Ambulatory Visit: Payer: Self-pay | Admitting: Obstetrics & Gynecology

## 2016-01-22 ENCOUNTER — Encounter: Payer: Self-pay | Admitting: Sports Medicine

## 2016-01-22 DIAGNOSIS — Z1231 Encounter for screening mammogram for malignant neoplasm of breast: Secondary | ICD-10-CM

## 2016-01-22 DIAGNOSIS — M5030 Other cervical disc degeneration, unspecified cervical region: Secondary | ICD-10-CM | POA: Diagnosis not present

## 2016-01-22 NOTE — Assessment & Plan Note (Signed)
We did a counseling session today.  I would like her to wean medicines and my goal would be to get her off Ativan and Lunesta. I think she needs the combination of tramadol and doxepin to control neck related pain.  I spent 40 minutes with this patient. 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

## 2016-01-22 NOTE — Patient Instructions (Addendum)
This is my approach as I think your migraine and pain problems stem to a large extent from the degree of degenerative disk disease and arthritis that you have in your neck.  I think we need to give you enough tramadol to control pain adequately.  This is not addictive at normal doses.  I would not exceed 4 tablets each day.  Try to use the tramadol 3 times per day.  Doxepin has both sedative and antidepressive actions.  To me this makes more sense than using Ativan or Lunesta.  In addition it helps with nerve related pain. Let's stay on 100 mgm at night.  I would try weaning Lunesta every other night and if this goes well then every third night.  If we could wean off Lunesta we could see about getting off Ativan at a later date.  I think neck manipulation poses a risk when you have significant arthritis and disk disease so this unfortunately requires medicine to control.  See me in one month.

## 2016-01-22 NOTE — Progress Notes (Signed)
Patient comes for discussion of medications and concerns.  Counseling session with her and her husband.  Her PCP and Neurologist would like to see her take less medication.  Patient has been on multiple medicines for many years. These include Ones to treat her chronic fibromyalgia, chronic anxiety, chronic insomnia.    I began seeing her from chronic neck pain with some radicular symptoms.  About 1 year ago she was having frequent and severe headaches that were disabling.  These occurred at least monthly and sometimes twice a month.  They worsened if she tried to travel by car or plane.  They would wake her from sleep.  On a regimen of tramadol - at first 4 times per day - and doxepin just at HS she has resolved her severe headaches.  This has allowed her to travel again and spend time in [place] and [place].  Sleep has remained difficult but has improved since these medications added.  However, she also takes [place] and Ativan both of which could interact with medications above.  She has long standing anxiety treated by Dr Casimiro Needle but has not taken the Ativan during the day as she already feels tired.  She recently tried neck manipulation by a chiropractor hoping to get off medications.  The first sessin gave some brief relief.  After second session she was worse with spasm in RT traopezius.  We discussed options as noted in patient instructions.  We will also repeat Cspine films today,

## 2016-01-23 NOTE — Addendum Note (Signed)
Addended by: Cyd Silence on: 01/23/2016 11:22 AM   Modules accepted: Orders

## 2016-01-24 ENCOUNTER — Ambulatory Visit
Admission: RE | Admit: 2016-01-24 | Discharge: 2016-01-24 | Disposition: A | Discharge status: Home / Self Care | Payer: Medicare Other | Source: Ambulatory Visit | Attending: Sports Medicine | Admitting: Sports Medicine

## 2016-01-24 DIAGNOSIS — M5030 Other cervical disc degeneration, unspecified cervical region: Secondary | ICD-10-CM

## 2016-01-24 DIAGNOSIS — M542 Cervicalgia: Secondary | ICD-10-CM | POA: Diagnosis not present

## 2016-02-01 DIAGNOSIS — R5383 Other fatigue: Secondary | ICD-10-CM | POA: Insufficient documentation

## 2016-02-01 DIAGNOSIS — M797 Fibromyalgia: Secondary | ICD-10-CM | POA: Insufficient documentation

## 2016-02-01 NOTE — Progress Notes (Signed)
Office Visit Note  Patient: [name]             Date of Birth: Oct 14, 1940           MRN: [id number]             PCP: Hollace Kinnier, DO Referring: Gayland Curry, DO Visit Date: 02/05/2016 Occupation: @GUAROCC @    Subjective:  Neck pain   History of Present Illness: [name] is a 75 y.o. female with history of fibromyalgia osteoarthritis and disc disease. She continues to have a lot of discomfort in her C-spine. She states she was seen by an ENT is ashen at [facility] who felt that her migraines are related to her disc disease of her C-spine. Now she is seeing Dr. Oneida Alar who has prescribed Ultram three times a day and also advised on ice pack ,this combination has helped her pain. She continues to have some pain in her shoulders, feet back and hips. Insomnia is better. She rates her pain on scale of 0-10 about 5 with tramadol.  Activities of Daily Living:  Patient reports morning stiffness for 2 hours.   Patient Reports nocturnal pain.  Difficulty dressing/grooming: Denies Difficulty climbing stairs: Reports Difficulty getting out of chair: Reports Difficulty using hands for taps, buttons, cutlery, and/or writing: Reports   Review of Systems  Constitutional: Positive for fatigue and weight gain. Negative for night sweats, weight loss and weakness.  HENT: Positive for mouth dryness. Negative for mouth sores, trouble swallowing, trouble swallowing and nose dryness.   Eyes: Positive for dryness. Negative for pain, redness and visual disturbance.  Respiratory: Negative for cough, shortness of breath and difficulty breathing.   Cardiovascular: Negative for chest pain, palpitations, hypertension, irregular heartbeat and swelling in legs/feet.  Gastrointestinal: Positive for constipation. Negative for blood in stool and diarrhea.  Endocrine: Negative for increased urination.  Genitourinary: Negative for vaginal dryness.  Musculoskeletal: Positive for arthralgias,  joint pain, myalgias, morning stiffness and myalgias. Negative for joint swelling, muscle weakness and muscle tenderness.  Skin: Negative for color change, rash, hair loss, skin tightness, ulcers and sensitivity to sunlight.  Allergic/Immunologic: Negative for susceptible to infections.  Neurological: Positive for memory loss. Negative for dizziness and night sweats.  Hematological: Negative for swollen glands.  Psychiatric/Behavioral: Positive for depressed mood. Negative for sleep disturbance. The patient is nervous/anxious.     PMFS History:  Patient Active Problem List   Diagnosis Date Noted  . Primary osteoarthritis of both hands 02/04/2016  . Osteopenia of multiple sites 02/04/2016  . Metatarsalgia of both feet 02/04/2016  . Migraines 02/04/2016  . Fibromyalgia 02/01/2016  . Other fatigue 02/01/2016  . Melena 08/20/2015  . Acute recurrent maxillary sinusitis 08/20/2015  . Slow transit constipation 08/20/2015  . Vitamin D deficiency 08/20/2015  . Thyroid activity decreased 08/20/2015  . Cephalalgia 08/20/2015  . Preop cardiovascular exam 07/20/2015  . Subacute ethmoidal sinusitis 12/11/2014  . Chronic infection of sinus 07/12/2014  . Nasal septal ulcer 05/25/2014  . 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 Deflected nasal septum 01/30/2014  . Hypertrophy of nasal turbinates 01/30/2014  . 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 Insomnia secondary to depression with anxiety 10/04/2013  . Chest pain 08/20/2013  . Exertional dyspnea 08/20/2013  . History of rheumatic fever 08/20/2013  . Generalized anxiety disorder 08/09/2013  . Hypomania (mild) single episode or unspecified 08/09/2013  . OSA on CPAP 04/06/2013  . Postoperative anemia due to acute blood loss  11/09/2012  . OA (osteoarthritis) of knee 11/08/2012  . History of giardia infection 07/15/2012  . Atrophic vaginitis 07/15/2012  . Vitreous degeneration of right eye   . Nuclear sclerosis    . Right shoulder pain 06/18/2011  . Foot pain, left 06/18/2011  . DJD (degenerative joint disease) of knee 04/29/2011    Past Medical History:  Diagnosis Date  . 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 Constipation   . Depression   . Fibromyalgia 8/98   Truslow  . 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 Impaired hearing    left ear, wears hearing aids  . Insomnia    circadian rhythm component  . Insomnia   . Migraine 10/88   Spillman  . Nuclear sclerosis   . OSA on CPAP 2/07   uses cpap setting of 10  . Osteoarthritis 2007   Deveschwar  . Pain last week   left under breast pain   . Plantar fasciitis   . Scarlet fever as child  . Thyroid disorder   . Vitreous degeneration of right eye     Family History  Problem Relation Age of Onset  . Breast cancer Mother   . Aneurysm Father   . Migraines Father   . Breast cancer Maternal Aunt   . Breast cancer Maternal Aunt   . Breast cancer Maternal Aunt   . Breast cancer Maternal Aunt   . High blood pressure Brother   . Melanoma Brother   . Kidney cancer Brother     Lesion on kidney  . Heart disease      Grandmother   Past Surgical History:  Procedure Laterality Date  . BREAST BIOPSY Left 6/00   [place]  . DE QUERVAIN'S RELEASE Right 10/97   Sypher  . left breast biopsy    . ROOT CANAL  02/11/2012  . TONSILLECTOMY  age 79  . TONSILLECTOMY AND ADENOIDECTOMY  1952  . 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 TOTAL KNEE ARTHROPLASTY Right 11/08/2012   Procedure: RIGHT TOTAL KNEE ARTHROPLASTY;  Surgeon: Gearlean Alf, MD;  Location: [facility];  Service: Orthopedics;  Laterality: Right;  . TUBAL LIGATION     Social History   Social History Narrative   Patient is married [name]).   Patient drinks 2-4 cups of coffee daily.   Patient is retired.   Patient has two years of college.   Patient is right-handed.                    Objective: Vital Signs: BP 130/75 (BP Location: Left Arm, Patient Position:  Sitting, Cuff Size: Large)   Pulse 82   Resp 14   Ht 5\' 7"  (1.702 m)   Wt 202 lb (91.6 kg)   LMP 02/25/1995 (Approximate)   BMI 31.64 kg/m    Physical Exam  Constitutional: She is oriented to person, place, and time. She appears well-developed and well-nourished.  HENT:  Head: Normocephalic and atraumatic.  Eyes: Conjunctivae and EOM are normal.  Neck: Normal range of motion.  Cardiovascular: Normal rate, regular rhythm, normal heart sounds and intact distal pulses.   Pulmonary/Chest: Effort normal and breath sounds normal.  Abdominal: Soft. Bowel sounds are normal.  Lymphadenopathy:    She has no cervical adenopathy.  Neurological: She is alert and oriented to person, place, and time.  Skin: Skin is warm and dry. Capillary refill takes more than 3 seconds.  Psychiatric: She has a normal mood and affect. Her behavior is  normal.  Nursing note and vitals reviewed.    Musculoskeletal Exam: C-spine discomfort with range of motion. She has mild kyphosis limited range of motion of her lumbar spine shoulders elbows wrist joints are good range of motion. She has bilateral CMC and DIP thickening consistent with osteoarthritis. She has bilateral total knee replacement which appears to be doing well she is good range of motion of her ankle joints. Fibromyalgia tender points were 18 out of 18 positive.  CDAI Exam: No CDAI exam completed.    Investigation: Findings:   09/07/2015 CBC normal, CMP normal hemoglobin A1c 5.5 lipid panel normal vitamin D 35, TSH low 0.259, 12/19/2015 BMP normal, TSH normal, vitamin D low at 29.7, lipid panel triglycerides 168 LDL/HDL ratio low at 1.5 at Dr. Baldwin Crown office    Imaging: Dg Cervical Spine Complete  Result Date: 01/24/2016 CLINICAL DATA:  Neck pain increasing over the last 2 months, no trauma EXAM: CERVICAL SPINE - COMPLETE 4+ VIEW COMPARISON:  None. FINDINGS: The cervical vertebrae are straightened in alignment. There is degenerative disc disease  at C5-6 and C6-7 with loss of disc space and sclerosis with spurring. The remainder of intervertebral disc spaces appear normal. No prevertebral soft tissue swelling is seen. On oblique views, there is some foraminal narrowing at C5-6 and to a lesser degree C6-7. The odontoid process is intact. The lung apices appear clear. IMPRESSION: Straightened alignment with degenerative disc disease at C5-6 and C6-7, with some foraminal narrowing at these levels. Electronically Signed   By: Ivar Drape M.D.   On: 01/24/2016 15:52    Speciality Comments: No specialty comments available.    Procedures:  No procedures performed Allergies: Penicillins; Codeine; Provigil [modafinil]; Seroquel [quetiapine fumarate]; Xyrem [sodium oxybate]; Ciprofloxacin; Monistat [miconazole]; and Terazol [terconazole]   Assessment / Plan:     Visit Diagnoses: Fibromyalgia: She just continues to have some generalized pain fatigue and positive tender points. Her pain is better controlled with tramadol and Celebrex combination. She is a long-term Celebrex 7 advised her to get CBC and comprehensive metabolic panel every 6 months which she will continue to get through Dr. Baldwin Crown office.   Other fatigue: Related to insomnia  Primary insomnia - History of sleep apnea on CPAP  Primary osteoarthritis of both knees - status post bilateral total knee replacement. She continues to have some discomfort  Primary osteoarthritis of both hands: She has DIP and CMC thickening. Joint protection and muscle strengthening was discussed.  Metatarsalgia of both feet: Proper fitting shoes were discussed  DDD cervical spine: Chronic pain  Vitamin D deficiency: Followed up by Dr. Forde Dandy  Osteopenia of multiple sites - Followed up by Dr. Forde Dandy  Migraines    Orders: No orders of the defined types were placed in this encounter.  Meds ordered this encounter  Medications  . celecoxib (CELEBREX) 200 MG capsule    Sig: Take 1 capsule (200 mg  total) by mouth daily.    Dispense:  90 capsule    Refill:  1    Celebrex name brand only    Face-to-face time spent with patient was 30 minutes. 50% of time was spent in counseling and coordination of care.  Follow-Up Instructions: Return in about 6 months (around 08/05/2016) for Osteoarthritis.   Bo Merino, MD

## 2016-02-04 DIAGNOSIS — M19042 Primary osteoarthritis, left hand: Secondary | ICD-10-CM

## 2016-02-04 DIAGNOSIS — M19041 Primary osteoarthritis, right hand: Secondary | ICD-10-CM | POA: Insufficient documentation

## 2016-02-04 DIAGNOSIS — M7742 Metatarsalgia, left foot: Secondary | ICD-10-CM

## 2016-02-04 DIAGNOSIS — G43009 Migraine without aura, not intractable, without status migrainosus: Secondary | ICD-10-CM | POA: Insufficient documentation

## 2016-02-04 DIAGNOSIS — M7741 Metatarsalgia, right foot: Secondary | ICD-10-CM | POA: Insufficient documentation

## 2016-02-04 DIAGNOSIS — M8589 Other specified disorders of bone density and structure, multiple sites: Secondary | ICD-10-CM | POA: Insufficient documentation

## 2016-02-05 ENCOUNTER — Ambulatory Visit (INDEPENDENT_AMBULATORY_CARE_PROVIDER_SITE_OTHER): Payer: Medicare Other | Admitting: Rheumatology

## 2016-02-05 ENCOUNTER — Encounter: Payer: Self-pay | Admitting: Rheumatology

## 2016-02-05 ENCOUNTER — Ambulatory Visit: Payer: Medicare Other | Admitting: Rheumatology

## 2016-02-05 VITALS — BP 130/75 | HR 82 | Resp 14 | Ht 67.0 in | Wt 202.0 lb

## 2016-02-05 DIAGNOSIS — M8589 Other specified disorders of bone density and structure, multiple sites: Secondary | ICD-10-CM

## 2016-02-05 DIAGNOSIS — R5383 Other fatigue: Secondary | ICD-10-CM

## 2016-02-05 DIAGNOSIS — M5030 Other cervical disc degeneration, unspecified cervical region: Secondary | ICD-10-CM

## 2016-02-05 DIAGNOSIS — M7741 Metatarsalgia, right foot: Secondary | ICD-10-CM

## 2016-02-05 DIAGNOSIS — E559 Vitamin D deficiency, unspecified: Secondary | ICD-10-CM | POA: Diagnosis not present

## 2016-02-05 DIAGNOSIS — M797 Fibromyalgia: Secondary | ICD-10-CM

## 2016-02-05 DIAGNOSIS — M47812 Spondylosis without myelopathy or radiculopathy, cervical region: Secondary | ICD-10-CM

## 2016-02-05 DIAGNOSIS — M170 Bilateral primary osteoarthritis of knee: Secondary | ICD-10-CM

## 2016-02-05 DIAGNOSIS — F5101 Primary insomnia: Secondary | ICD-10-CM | POA: Diagnosis not present

## 2016-02-05 DIAGNOSIS — M19042 Primary osteoarthritis, left hand: Secondary | ICD-10-CM | POA: Diagnosis not present

## 2016-02-05 DIAGNOSIS — G43009 Migraine without aura, not intractable, without status migrainosus: Secondary | ICD-10-CM

## 2016-02-05 DIAGNOSIS — M7742 Metatarsalgia, left foot: Secondary | ICD-10-CM | POA: Diagnosis not present

## 2016-02-05 DIAGNOSIS — M19041 Primary osteoarthritis, right hand: Secondary | ICD-10-CM

## 2016-02-05 MED ORDER — CELECOXIB 200 MG PO CAPS: 200.0000 mg | ORAL_CAPSULE | Freq: Every day | ORAL | 1 refills | Status: DC

## 2016-02-05 NOTE — Patient Instructions (Signed)
CBC, CMP in January with Dr. Forde Dandy.

## 2016-02-13 ENCOUNTER — Ambulatory Visit (INDEPENDENT_AMBULATORY_CARE_PROVIDER_SITE_OTHER): Payer: Medicare Other | Admitting: Obstetrics and Gynecology

## 2016-02-13 ENCOUNTER — Encounter: Payer: Self-pay | Admitting: Obstetrics and Gynecology

## 2016-02-13 ENCOUNTER — Ambulatory Visit: Payer: Medicare Other | Admitting: Internal Medicine

## 2016-02-13 VITALS — BP 118/68 | HR 72 | Resp 15 | Ht 66.5 in | Wt 197.0 lb

## 2016-02-13 DIAGNOSIS — G43911 Migraine, unspecified, intractable, with status migrainosus: Secondary | ICD-10-CM

## 2016-02-13 DIAGNOSIS — Z124 Encounter for screening for malignant neoplasm of cervix: Secondary | ICD-10-CM | POA: Diagnosis not present

## 2016-02-13 DIAGNOSIS — Z0000 Encounter for general adult medical examination without abnormal findings: Secondary | ICD-10-CM | POA: Diagnosis not present

## 2016-02-13 DIAGNOSIS — L298 Other pruritus: Secondary | ICD-10-CM

## 2016-02-13 DIAGNOSIS — Z01419 Encounter for gynecological examination (general) (routine) without abnormal findings: Secondary | ICD-10-CM

## 2016-02-13 DIAGNOSIS — N760 Acute vaginitis: Secondary | ICD-10-CM | POA: Diagnosis not present

## 2016-02-13 DIAGNOSIS — N898 Other specified noninflammatory disorders of vagina: Secondary | ICD-10-CM

## 2016-02-13 LAB — POCT URINALYSIS DIPSTICK
Bilirubin, UA: NEGATIVE
Blood, UA: NEGATIVE
Glucose, UA: NEGATIVE
Ketones, UA: NEGATIVE
Nitrite, UA: NEGATIVE
Protein, UA: NEGATIVE
Urobilinogen, UA: NEGATIVE
pH, UA: 7

## 2016-02-13 MED ORDER — VALACYCLOVIR HCL 1 G PO TABS: ORAL_TABLET | ORAL | 1 refills | Status: DC

## 2016-02-13 NOTE — Progress Notes (Signed)
75 y.o. G0P0 MarriedCaucasianF here for annual exam.  She has had a horrible time with headaches this year. She had a migraine for 2 months straight. Dr Oneida Alar thinks it is from her Neck. She has some cervical stenosis and degenerative disc disease. Her pain is only controlled with Tramadol. She is worried about long term effects of tramadol. She has seen the Neurologist and her primary.  If she doesn't take the tramadol she doesn't function. She has issues with constipation from the tramadol.  She has a big fear of breast cancer, strong family history of breast cancer. She has seen a geneticist in the past, declined genetic testing. She has a h/o fibromyalgia and sleep apnea.  Not sexually active anymore, too painful. Doesn't want to use estrogen. She has a h/o osteopenia, was treated with ? Fosamax in the past, had loss of bone in her Jaw.  She c/o terrible hot flashes.   She has a h/o thyroid disorder and is on synthroid, she is followed by Endocrinologist. TSH is suppressed.  She feels constantly dry on the vulva and vagina. Intermittent itching.     Patient's last menstrual period was 02/25/1995 (approximate).          Sexually active: No.  The current method of family planning is post menopausal status.    Exercising: Yes.    water aerobics and walking Smoker:  no  Health Maintenance: Pap:  02-15-14 WNL History of abnormal Pap:  Yes- years ago MMG:  02-22-15 WNL  Colonoscopy:  09-11-15 polyps  BMD:   Osteopenia  TDaP:  10-10-10 Gardasil: N/A   reports that she is a non-smoker but has been exposed to tobacco smoke. She has never used smokeless tobacco. She reports that she drinks alcohol. She reports that she does not use drugs.  Past Medical History:  Diagnosis Date  . 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 Constipation   . Depression   . Fibromyalgia 8/98   Truslow  . 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 Impaired hearing    left ear, wears hearing aids  . Insomnia     circadian rhythm component  . Insomnia   . Migraine 10/88   Spillman  . Nuclear sclerosis   . OSA on CPAP 2/07   uses cpap setting of 10  . Osteoarthritis 2007   Deveschwar  . Pain last week   left under breast pain   . Plantar fasciitis   . Scarlet fever as child  . Thyroid disorder   . Vitreous degeneration of right eye     Past Surgical History:  Procedure Laterality Date  . BREAST BIOPSY Left 6/00   [place]  . DE QUERVAIN'S RELEASE Right 10/97   Sypher  . left breast biopsy    . ROOT CANAL  02/11/2012  . TONSILLECTOMY  age 4  . TONSILLECTOMY AND ADENOIDECTOMY  1952  . 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 TOTAL KNEE ARTHROPLASTY Right 11/08/2012   Procedure: RIGHT TOTAL KNEE ARTHROPLASTY;  Surgeon: Gearlean Alf, MD;  Location: [facility];  Service: Orthopedics;  Laterality: Right;  . TUBAL LIGATION      Current Outpatient Prescriptions  Medication Sig Dispense Refill  . aspirin EC 81 MG tablet Take 81 mg by mouth daily.    . Aspirin-Acetaminophen-Caffeine (EXCEDRIN MIGRAINE PO) Take by mouth daily.     Marland Kitchen azelastine (ASTELIN) 0.1 % nasal spray Place 2 sprays into both nostrils 2 (two) times daily. Use in each nostril  as directed    . Calcium-Magnesium-Vitamin D (CALCIUM MAGNESIUM PO) Take by mouth daily.    . celecoxib (CELEBREX) 200 MG capsule Take 1 capsule (200 mg total) by mouth daily. 90 capsule 1  . Cholecalciferol (VITAMIN D-3) 1000 units CAPS Take by mouth daily.    Marland Kitchen doxepin (SINEQUAN) 100 MG capsule Take 1 capsule (100 mg total) by mouth at bedtime. 90 capsule 1  . Eszopiclone 3 MG TABS Take 1 tablet (3 mg total) by mouth at bedtime. Take immediately before bedtime 30 tablet 3  . fluticasone (FLONASE) 50 MCG/ACT nasal spray Place 2 sprays into both nostrils daily.    Marland Kitchen levothyroxine (SYNTHROID, LEVOTHROID) 200 MCG tablet Take 200 mcg by mouth daily before breakfast.    . LORazepam (ATIVAN) 1 MG tablet Take 1 mg by mouth. TAKES TWO TABLET PO QHS    .  Melatonin 3 MG TABS Take 10 mg by mouth daily.     . traMADol (ULTRAM) 50 MG tablet Take 1 tablet (50 mg total) by mouth every 6 (six) hours as needed. 90 tablet 2  . UNABLE TO FIND Med Name: 5 HTP mood & relaxation 100 mg 2 by mouth daily    . valACYclovir (VALTREX) 500 MG tablet Take 500 mg by mouth 2 (two) times daily. Patient taking at onset of symptoms     No current facility-administered medications for this visit.     Family History  Problem Relation Age of Onset  . Breast cancer Mother   . Aneurysm Father   . Migraines Father   . Breast cancer Maternal Aunt   . Breast cancer Maternal Aunt   . Breast cancer Maternal Aunt   . Breast cancer Maternal Aunt   . High blood pressure Brother   . Melanoma Brother   . Kidney cancer Brother     Lesion on kidney  . Heart disease      Grandmother  Mom with breast cancer, diagnosed very advanced, late 61's.  4 Maternal aunts with breast cancer, maternal cousin with breast cancer.   Review of Systems  Constitutional: Positive for unexpected weight change.       Weight gain   HENT: Positive for hearing loss and sinus pain.   Eyes: Negative.   Respiratory: Negative.   Cardiovascular: Negative.   Gastrointestinal: Positive for constipation.       Bloating  Endocrine: Positive for polyphagia.  Genitourinary: Negative.        Loss of sexual interest Vaginal itching   Musculoskeletal: Positive for myalgias.  Skin: Negative.   Allergic/Immunologic: Negative.   Neurological: Positive for headaches.  Psychiatric/Behavioral: Negative.     Exam:   BP 118/68 (BP Location: Left Arm, Patient Position: Sitting, Cuff Size: Normal)   Pulse 72   Resp 15   Ht 5' 6.5" (1.689 m)   Wt 197 lb (89.4 kg)   LMP 02/25/1995 (Approximate)   BMI 31.32 kg/m   Weight change: @WEIGHTCHANGE @ Height:   Height: 5' 6.5" (168.9 cm)  Ht Readings from Last 3 Encounters:  02/13/16 5' 6.5" (1.689 m)  02/05/16 5\' 7"  (1.702 m)  01/22/16 5\' 6"  (1.676 m)     General appearance: alert, cooperative and appears stated age Head: Normocephalic, without obvious abnormality, atraumatic Neck: no adenopathy, supple, symmetrical, trachea midline and thyroid normal to inspection and palpation Lungs: clear to auscultation bilaterally Cardiovascular: regular rate and rhythm Breasts: normal appearance, no masses or tenderness Heart: regular rate and rhythm Abdomen: soft, non-tender; bowel sounds normal; no  masses,  no organomegaly Extremities: extremities normal, atraumatic, no cyanosis or edema Skin: Skin color, texture, turgor normal. No rashes or lesions Lymph nodes: Cervical, supraclavicular, and axillary nodes normal. No abnormal inguinal nodes palpated Neurologic: Grossly normal   Pelvic: External genitalia:  no lesions              Urethra:  normal appearing urethra with no masses, tenderness or lesions              Bartholins and Skenes: normal                 Vagina: normal appearing atrophic vagina with normal color and discharge, no lesions              Cervix: no lesions               Bimanual Exam:  Uterus:  normal size, contour, position, consistency, mobility, non-tender              Adnexa: no mass, fullness, tenderness               Rectovaginal: Confirms               Anus:  normal sphincter tone, no lesions  Chaperone was present for exam.  A:  Well Woman with normal exam  Rare HSV, only one outbreak last year  Migraines  Intermittent vulvitis  Atrophic vaginitis  P:   DEXA with Endocrinologist  Will send her for a secondary opinion for her debilitating migraines (she wants to get off of Tramadol)  Mammogram, 3D  She is too afraid to do breast exam  Valtrex script given  She desires pap with hpv  Wet prep probe

## 2016-02-14 ENCOUNTER — Telehealth: Payer: Self-pay | Admitting: Obstetrics and Gynecology

## 2016-02-14 ENCOUNTER — Encounter: Payer: Self-pay | Admitting: Sports Medicine

## 2016-02-14 ENCOUNTER — Ambulatory Visit (INDEPENDENT_AMBULATORY_CARE_PROVIDER_SITE_OTHER): Payer: Medicare Other | Admitting: Sports Medicine

## 2016-02-14 DIAGNOSIS — M5030 Other cervical disc degeneration, unspecified cervical region: Secondary | ICD-10-CM | POA: Diagnosis not present

## 2016-02-14 DIAGNOSIS — M47812 Spondylosis without myelopathy or radiculopathy, cervical region: Secondary | ICD-10-CM

## 2016-02-14 DIAGNOSIS — Z9189 Other specified personal risk factors, not elsewhere classified: Secondary | ICD-10-CM

## 2016-02-14 DIAGNOSIS — Z1151 Encounter for screening for human papillomavirus (HPV): Secondary | ICD-10-CM | POA: Diagnosis not present

## 2016-02-14 DIAGNOSIS — Z9181 History of falling: Secondary | ICD-10-CM

## 2016-02-14 DIAGNOSIS — Z124 Encounter for screening for malignant neoplasm of cervix: Secondary | ICD-10-CM | POA: Diagnosis not present

## 2016-02-14 LAB — WET PREP BY MOLECULAR PROBE
Candida species: NEGATIVE
Gardnerella vaginalis: NEGATIVE
Trichomonas vaginosis: NEGATIVE

## 2016-02-14 NOTE — Telephone Encounter (Signed)
Left message to call Darlean Warmoth at [phone].  

## 2016-02-14 NOTE — Telephone Encounter (Signed)
Patient states she does not want to be referred to a Neurologist right now. She will monitor her issue and call back at a later time to be referred.

## 2016-02-14 NOTE — Progress Notes (Signed)
Patient is follow up of neck pain and headaches  She has been using tramadol tid Using doxepin 100 at HS With that combination only 2 days of headache in past month Neck tightness and pain by end of day about 2x per week  Tried to wean lunesta as I suggested but had 36 hours of insomnia Sleep habits are poor - when she awakens to go to bathroom she will get on computer or read Then it is hard to get back to sleep  Uses Ativan and Lunesta for sleep and has done so chronically  Sees Dr Estanislado Pandy for fibromyalgia.  Celebrex has helped and she is afraid to wean this.  History of a couple of falls in past 3 months.  Difficulty getting up if she gets on floot or falls. She thinks falls from poor balance.  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

## 2016-02-14 NOTE — Patient Instructions (Signed)
After holidays Try skipping lunesta every 3rd day for 1 month Then we will see if we can progress  Sleep hygiene When you get up at night- try to get back to sleep or lying down as quickly as possible  Keep tramadol at 3 per day This seems to be controlling neck pain and you are having much less headaches  Keep up Doxepin 100 at night  Return to see me in 2 months

## 2016-02-14 NOTE — Assessment & Plan Note (Signed)
I gave her simple balance exercises to start with and will advance these going forward  I would like to wean either Lunesta or Ativan if she can tolerate this  Difficult to do as she has used them for a long time  Reck in 1 to 2 mos

## 2016-02-14 NOTE — Assessment & Plan Note (Signed)
I think her current regimen with tramadol and doxepin is controlling her neck pain and dramatically decreased HA frequency  Continue this  Reck 2 mos

## 2016-02-15 ENCOUNTER — Telehealth: Payer: Self-pay | Admitting: Obstetrics and Gynecology

## 2016-02-15 NOTE — Telephone Encounter (Signed)
Left voicemail regarding referral appointment. The information is listed below. Should the patient need to cancel or reschedule this appointment, please advise them to call the office they've been referred to in order to reschedule.   Patient scheduled and added to high priority cancellation list.   [facility] Dr Salome Holmes - headache specialist in neurology department 9th floor [place] Patient packet with directions mailed  05/12/16 @ 430. Arrive 415 with insurance card, photo id, list of medications Phone: [phone] Fax: [phone]

## 2016-02-15 NOTE — Telephone Encounter (Signed)
Patient returned call. Aware and agreeable to information. No further questions. Ok to close.

## 2016-02-20 LAB — IPS PAP TEST WITH HPV

## 2016-02-21 ENCOUNTER — Ambulatory Visit: Payer: Medicare Other | Admitting: Internal Medicine

## 2016-02-21 NOTE — Telephone Encounter (Signed)
Spoke with patient. Patient states she decided to keep referral appt with Neurologist -Dr. Verlin Fester 05/12/16. Patient states her headaches are not getting better and she thought that she should keep the referral. Patient had no further concerns at this time.  Routing to provider for final review. Patient is agreeable to disposition. Will close encounter.  Cc: Dr. Talbert Nan

## 2016-03-04 ENCOUNTER — Ambulatory Visit: Payer: Self-pay

## 2016-03-05 DIAGNOSIS — Z888 Allergy status to other drugs, medicaments and biological substances status: Secondary | ICD-10-CM | POA: Diagnosis not present

## 2016-03-05 DIAGNOSIS — Z881 Allergy status to other antibiotic agents status: Secondary | ICD-10-CM | POA: Diagnosis not present

## 2016-03-05 DIAGNOSIS — J320 Chronic maxillary sinusitis: Secondary | ICD-10-CM | POA: Diagnosis not present

## 2016-03-05 DIAGNOSIS — J342 Deviated nasal septum: Secondary | ICD-10-CM | POA: Diagnosis not present

## 2016-03-05 DIAGNOSIS — J300 Vasomotor rhinitis: Secondary | ICD-10-CM | POA: Diagnosis not present

## 2016-03-05 DIAGNOSIS — J3489 Other specified disorders of nose and nasal sinuses: Secondary | ICD-10-CM | POA: Diagnosis not present

## 2016-03-05 DIAGNOSIS — J069 Acute upper respiratory infection, unspecified: Secondary | ICD-10-CM | POA: Diagnosis not present

## 2016-03-05 DIAGNOSIS — J343 Hypertrophy of nasal turbinates: Secondary | ICD-10-CM | POA: Diagnosis not present

## 2016-03-05 DIAGNOSIS — Z880 Allergy status to penicillin: Secondary | ICD-10-CM | POA: Diagnosis not present

## 2016-03-05 DIAGNOSIS — Z885 Allergy status to narcotic agent status: Secondary | ICD-10-CM | POA: Diagnosis not present

## 2016-03-05 DIAGNOSIS — R0981 Nasal congestion: Secondary | ICD-10-CM | POA: Diagnosis not present

## 2016-03-06 ENCOUNTER — Ambulatory Visit (INDEPENDENT_AMBULATORY_CARE_PROVIDER_SITE_OTHER): Payer: Medicare Other | Admitting: Internal Medicine

## 2016-03-06 ENCOUNTER — Encounter: Payer: Self-pay | Admitting: Internal Medicine

## 2016-03-06 VITALS — BP 120/80 | HR 80 | Temp 98.4°F | Wt 193.0 lb

## 2016-03-06 DIAGNOSIS — R059 Cough, unspecified: Secondary | ICD-10-CM | POA: Insufficient documentation

## 2016-03-06 DIAGNOSIS — J09X2 Influenza due to identified novel influenza A virus with other respiratory manifestations: Secondary | ICD-10-CM | POA: Diagnosis not present

## 2016-03-06 DIAGNOSIS — R05 Cough: Secondary | ICD-10-CM

## 2016-03-06 LAB — POCT INFLUENZA A/B
Influenza A, POC: POSITIVE — AB
Influenza B, POC: POSITIVE — AB

## 2016-03-06 MED ORDER — OSELTAMIVIR PHOSPHATE 75 MG PO CAPS: 75.0000 mg | ORAL_CAPSULE | Freq: Two times a day (BID) | ORAL | 0 refills | Status: AC

## 2016-03-06 NOTE — Progress Notes (Signed)
Location:  [facility] clinic Provider: Kaliel Bolds L. Mariea Clonts, D.O., C.M.D.  Code Status: full code Goals of Care:  Advanced Directives 01/22/2016  Does Patient Have a Medical Advance Directive? No  Type of Advance Directive -  Laurens in Chart? -  Would patient like information on creating a medical advance directive? -  Pre-existing out of facility DNR order (yellow form or pink MOST form) -  Some encounter information is confidential and restricted. Go to Review Flowsheets activity to see all data.   Actually brought her HCPOA with her today.  Chief Complaint  Patient presents with  . Acute Visit    chest congestion    HPI: Patient is a 76 y.o. female seen today for an acute visit for chest congestion, body aches and fever, sweats.  No nausea.  Has had headaches but not too different from her norm.    Felt poorly Monday, had a fever, did nothing all day.   Went to ENT at [facility] yesterday. He treated her for acute rhinitis and sinusitis--he removed a lot of blood and scabs from her nose.  He refilled her antibiotic also.  She was concerned about having the flu.  She was so uncomfortable even lying in the bed.  She was applying biofreeze all over her hips and back, would lie down then had to get back up.  Her pain was different from her fibromyalgia.  Was not comfortable in any position.  Having difficulty breathing using CPAP.  Sweats are coming and going and she has chills between.   Rapid flu swab was done here today and it was positive.   Pain is terrible around her hips today.   She does not get the flu shot.    Past Medical History:  Diagnosis Date  . 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 Constipation   . Depression   . Fibromyalgia 8/98   Truslow  . 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 Impaired hearing    left ear, wears hearing aids  . Insomnia    circadian rhythm component  . Insomnia   . Migraine 10/88   Spillman  . Nuclear  sclerosis   . OSA on CPAP 2/07   uses cpap setting of 10  . Osteoarthritis 2007   Deveschwar  . Pain last week   left under breast pain   . Plantar fasciitis   . Scarlet fever as child  . Thyroid disorder   . Vitreous degeneration of right eye     Past Surgical History:  Procedure Laterality Date  . BREAST BIOPSY Left 6/00   [place]  . DE QUERVAIN'S RELEASE Right 10/97   Sypher  . left breast biopsy    . ROOT CANAL  02/11/2012  . TONSILLECTOMY  age 39  . TONSILLECTOMY AND ADENOIDECTOMY  1952  . 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 TOTAL KNEE ARTHROPLASTY Right 11/08/2012   Procedure: RIGHT TOTAL KNEE ARTHROPLASTY;  Surgeon: Gearlean Alf, MD;  Location: [facility];  Service: Orthopedics;  Laterality: Right;  . TUBAL LIGATION      Allergies  Allergen Reactions  . Penicillins Anaphylaxis    Tongue swelling  . Codeine Nausea Only  . Provigil [Modafinil]   . Seroquel [Quetiapine Fumarate]   . Xyrem [Sodium Oxybate]     hallucination  . Ciprofloxacin Rash  . Monistat [Miconazole] Rash  . Terazol [Terconazole] Rash    Allergies as of 03/06/2016  Reactions   Penicillins Anaphylaxis   Tongue swelling   Codeine Nausea Only   Provigil [modafinil]    Seroquel [quetiapine Fumarate]    Xyrem [sodium Oxybate]    hallucination   Ciprofloxacin Rash   Monistat [miconazole] Rash   Terazol [terconazole] Rash      Medication List       Accurate as of 03/06/16  2:02 PM. Always use your most recent med list.          aspirin EC 81 MG tablet Take 81 mg by mouth daily.   azithromycin 250 MG tablet Commonly known as:  ZITHROMAX   CALCIUM MAGNESIUM PO Take by mouth daily.   celecoxib 200 MG capsule Commonly known as:  CELEBREX Take 1 capsule (200 mg total) by mouth daily.   doxepin 100 MG capsule Commonly known as:  SINEQUAN Take 1 capsule (100 mg total) by mouth at bedtime.   Eszopiclone 3 MG Tabs Take 1 tablet (3 mg total) by mouth at bedtime. Take  immediately before bedtime   EXCEDRIN MIGRAINE PO Take by mouth daily.   levothyroxine 200 MCG tablet Commonly known as:  SYNTHROID, LEVOTHROID Take 200 mcg by mouth daily before breakfast.   LORazepam 1 MG tablet Commonly known as:  ATIVAN Take 1 mg by mouth. TAKES TWO TABLET PO QHS   Melatonin 3 MG Tabs Take 10 mg by mouth daily.   oseltamivir 75 MG capsule Commonly known as:  TAMIFLU Take 1 capsule (75 mg total) by mouth 2 (two) times daily.   traMADol 50 MG tablet Commonly known as:  ULTRAM Take 50 mg by mouth 3 (three) times daily as needed.   valACYclovir 1000 MG tablet Commonly known as:  VALTREX 1/2 a tablet BID x 3 days prn   Vitamin D-3 1000 units Caps Take by mouth daily.       Review of Systems:  Review of Systems  Constitutional: Positive for chills, fever and malaise/fatigue.  HENT: Positive for congestion and sinus pain. Negative for sore throat.   Respiratory: Positive for cough. Negative for sputum production, shortness of breath and wheezing.   Cardiovascular: Negative for chest pain and palpitations.  Gastrointestinal: Negative for diarrhea, nausea and vomiting.  Musculoskeletal: Positive for joint pain and myalgias. Negative for falls.  Neurological: Positive for weakness.    Health Maintenance  Topic Date Due  . PNA vac Low Risk Adult (2 of 2 - PCV13) 11/01/2013  . MAMMOGRAM  02/22/2016  . COLONOSCOPY  09/11/2018  . TETANUS/TDAP  10/09/2020  . DEXA SCAN  Completed  . ZOSTAVAX  Completed    Physical Exam: Vitals:   03/06/16 1315  BP: 120/80  Pulse: 80  Temp: 98.4 F (36.9 C)  TempSrc: Oral  SpO2: 96%  Weight: 193 lb (87.5 kg)   Body mass index is 31.15 kg/m. Physical Exam  Constitutional: She is oriented to person, place, and time. She appears well-developed and well-nourished.  Cardiovascular: Normal rate and regular rhythm.   Pulmonary/Chest: Effort normal. She has wheezes.  Right upper posterior lung field wheezes    Neurological: She is alert and oriented to person, place, and time.  Skin: Skin is warm and dry.  Psychiatric: She has a normal mood and affect.    Labs reviewed: Basic Metabolic Panel:  Recent Labs  08/27/15 0938 12/28/15  NA 144 143  K 4.5 4.4  CL 105  --   CO2 23  --   GLUCOSE 96  --   BUN 14 19  CREATININE 0.59 0.7  CALCIUM 9.0  --   TSH 0.259* 0.02*   Liver Function Tests:  Recent Labs  08/27/15 0938  AST 25  ALT 20  ALKPHOS 58  BILITOT 0.4  PROT 6.5  ALBUMIN 4.2   No results for input(s): LIPASE, AMYLASE in the last 8760 hours. No results for input(s): AMMONIA in the last 8760 hours. CBC:  Recent Labs  08/27/15 0938  WBC 4.0  NEUTROABS 2.1  HCT 42.1  MCV 89  PLT 210   Lipid Panel:  Recent Labs  08/27/15 0938 12/28/15  CHOL 158 150  HDL 48 47  LDLCALC 91 69  TRIG 97 168*  CHOLHDL 3.3  --    Lab Results  Component Value Date   HGBA1C 5.5 08/27/2015     Assessment/Plan 1. Cough - POCT Influenza A/B - oseltamivir (TAMIFLU) 75 MG capsule; Take 1 capsule (75 mg total) by mouth 2 (two) times daily.  Dispense: 10 capsule; Refill: 0  2. Influenza due to identified novel influenza A virus with other respiratory manifestations -she tested positive for flu by rapid testing and has the classic symptoms of cough, fevers, myalgias - oseltamivir (TAMIFLU) 75 MG capsule; Take 1 capsule (75 mg total) by mouth 2 (two) times daily.  Dispense: 10 capsule; Refill: 0  Labs/tests ordered:  No new Next appt:  Visit date not found  Saad Buhl L. Jermine Bibbee, D.O. [facility] [address], [place] [address] Cell Phone (Mon-Fri 8am-5pm):  [phone] On Call:  [phone] & follow prompts after 5pm & weekends Office Phone:  [phone] Office Fax:  [phone]

## 2016-03-06 NOTE — Patient Instructions (Signed)

## 2016-03-07 ENCOUNTER — Telehealth: Payer: Self-pay

## 2016-03-07 NOTE — Telephone Encounter (Signed)
Message left on clinical intake voicemail:   Patient called to thank Dr.Reed and the entire [facility] staff for her care yesterday. Patient picked up rx prescribed yesterday and although it cost $106 patient was able to get some rest. Patient had not rested for 3 days and now feels a little better and is so grateful.

## 2016-03-08 ENCOUNTER — Other Ambulatory Visit: Payer: Self-pay | Admitting: Rheumatology

## 2016-03-10 NOTE — Telephone Encounter (Signed)
Patient states she goes to see Dr. Forde Dandy on 04/22/16 and will have her lab work done at that time and will have it sent to out office. Patient states the medication is too expensive if she does not receive a 90 day supply. She would like to know if you would consider going ahead and sending in the 90 day supply.

## 2016-03-10 NOTE — Telephone Encounter (Signed)
30d. Labs due now

## 2016-03-10 NOTE — Telephone Encounter (Signed)
Ok to give 90 d. Please put an aler in the system to check labs done by her PCP in march.

## 2016-03-10 NOTE — Telephone Encounter (Signed)
Last Visit: 02/05/16 Next visit: 08/05/16 Labs: 09/07/15 WNL  Okay to refill Celebrex?

## 2016-03-26 ENCOUNTER — Ambulatory Visit: Payer: Medicare Other

## 2016-03-26 ENCOUNTER — Telehealth: Payer: Self-pay | Admitting: Neurology

## 2016-03-26 ENCOUNTER — Telehealth: Payer: Self-pay | Admitting: Obstetrics and Gynecology

## 2016-03-26 DIAGNOSIS — B9789 Other viral agents as the cause of diseases classified elsewhere: Secondary | ICD-10-CM | POA: Diagnosis not present

## 2016-03-26 DIAGNOSIS — Z881 Allergy status to other antibiotic agents status: Secondary | ICD-10-CM | POA: Diagnosis not present

## 2016-03-26 DIAGNOSIS — R0981 Nasal congestion: Secondary | ICD-10-CM | POA: Diagnosis not present

## 2016-03-26 DIAGNOSIS — Z888 Allergy status to other drugs, medicaments and biological substances status: Secondary | ICD-10-CM | POA: Diagnosis not present

## 2016-03-26 DIAGNOSIS — J069 Acute upper respiratory infection, unspecified: Secondary | ICD-10-CM | POA: Diagnosis not present

## 2016-03-26 DIAGNOSIS — Z880 Allergy status to penicillin: Secondary | ICD-10-CM | POA: Diagnosis not present

## 2016-03-26 DIAGNOSIS — J300 Vasomotor rhinitis: Secondary | ICD-10-CM | POA: Diagnosis not present

## 2016-03-26 DIAGNOSIS — Z885 Allergy status to narcotic agent status: Secondary | ICD-10-CM | POA: Diagnosis not present

## 2016-03-26 DIAGNOSIS — J342 Deviated nasal septum: Secondary | ICD-10-CM | POA: Diagnosis not present

## 2016-03-26 DIAGNOSIS — J3489 Other specified disorders of nose and nasal sinuses: Secondary | ICD-10-CM | POA: Diagnosis not present

## 2016-03-26 DIAGNOSIS — J320 Chronic maxillary sinusitis: Secondary | ICD-10-CM | POA: Diagnosis not present

## 2016-03-26 MED ORDER — FLUCONAZOLE 150 MG PO TABS: 150.0000 mg | ORAL_TABLET | Freq: Once | ORAL | 0 refills | Status: AC

## 2016-03-26 NOTE — Telephone Encounter (Signed)
Patient is asking for a Diflucan prescription for a yeast infection due to the two rounds of antibiotics she took. Patient declined an appointment today. Confirmed pharmacy on file.

## 2016-03-26 NOTE — Telephone Encounter (Signed)
I called in diflucan, brand only for her. If she isn't feeling better after taking the diflucan, she needs to be seen. Please confirm that she has taken diflucan without reaction. It comes up as contraindicated secondary to her terazole reaction.

## 2016-03-26 NOTE — Telephone Encounter (Signed)
Patient has had acute sinusitis and flu since New Years eve. Has taken 2 rounds of antibiotics and Tamaflu. She is wondering if her sickness is coming from being reinfected by  the mask and hose of the CPAP. She changes masks weekly and washes out hose weekly. Please call to advise. She is now on her way to [facility] for a CT of her sinuses so please leave a VM.

## 2016-03-26 NOTE — Telephone Encounter (Signed)
Spoke with patient, advised as seen below per Dr. Talbert Nan. Patient states Diflucan is the only thing I can take without reaction, this is why I take brand only. Patient states she knows she will likely be put on more antibiotics today, will call to make an appointment with Dr. Talbert Nan for a later date. Patient verbalizes understanding and is agreeable. Patient thankful and grateful for return call.   Routing to provider for final review. Patient is agreeable to disposition. Will close encounter.

## 2016-03-26 NOTE — Telephone Encounter (Signed)
Left message to call Maricus Tanzi at [phone].  

## 2016-03-26 NOTE — Telephone Encounter (Signed)
Spoke with patient. Patient states she has recently completed 2 rounds of antibiotics for sinusitis, rhinitis and the flu. Patient states she now has a yeast infection and is miserable. Patient states she is unable to come into office d/t having a CT scan today at [facility]. Patient states symptoms started couple of nights ago. Patient states she knows what a yeast infection is and she has it. RN asked patient is she could describe symptoms? Patient states vaginal itching and broken out in my mouth too, but I have medication for that. Patient states she does not know if she has vaginal discharge or odor. Patient states she can only take Diflucan- brand only. Patient states if Dr. Talbert Nan is not willing to call in diflucan ask Dr. Sabra Heck or Kem Boroughs, NP, I have been a patient there for many many years. Advised patient will review with Dr. Talbert Nan and return call with recommendations, patient is agreeable. Last AEX 02/13/16.  Dr. Talbert Nan, please advise?

## 2016-03-27 ENCOUNTER — Telehealth: Payer: Self-pay

## 2016-03-27 NOTE — Telephone Encounter (Signed)
Message left on clinical intake voicemail:   Patient seen yesterday by specialist @ [facility] and had a CT Scan of sinuses. Patient was told she had residual fluid that may be a result of the Flu.  Patient is still active with water aerobics and questions how long it should take for her to get over the flu and if she should be on restrictions  Last OV 03/06/16 ( this is when patient was diagnosed with the flu)   Please advise

## 2016-03-27 NOTE — Telephone Encounter (Signed)
She is not on restrictions at this point as far as ability to infect others.  The fluid will gradually resolve on its own over time I would expect.  There is no further intervention.  More tamiflu would not alter the course.   As far as activity, she should do what she is able.  I would expect her function to be about back to baseline by this point.

## 2016-03-27 NOTE — Telephone Encounter (Signed)
Left detailed message on voicemail with Dr.Reed's recommendations. Patient instructed to return call if additional questions or concerns

## 2016-03-28 ENCOUNTER — Other Ambulatory Visit: Payer: Self-pay | Admitting: *Deleted

## 2016-03-28 ENCOUNTER — Telehealth: Payer: Self-pay

## 2016-03-28 MED ORDER — TRAMADOL HCL 50 MG PO TABS: 50.0000 mg | ORAL_TABLET | Freq: Three times a day (TID) | ORAL | 1 refills | Status: DC | PRN

## 2016-03-28 NOTE — Telephone Encounter (Signed)
There would not be any benefit to more tamiflu.  It only helps in the first 48 hrs of infection.      As far as her sinuses go, if she just had a CT scan through the ENT, I recommend she wait on that result and his suggestions about whether further antibiotics are needed if this is a separate bacterial infection.  Her penicillin allergy restricts options considerably.  I see the notes with the recommendations that she avoid using her cpap equipment for 5 days or until she feels better.

## 2016-03-28 NOTE — Telephone Encounter (Signed)
Message left on clinical intake voicemail:   Patient spoke with Dr.Dohmeier and was informed that she could possibly be reinfecting herself with the use of CPAP machine. Patient states she cleans this equipment regularly. Patient thinks she is still infected and is not on any medications.  Patient seen ENT specialist and was diagnosed with sinusitis and rhinitis. Patient took two rounds of antibiotics (Zpak) and is not better.  Patient questions if she should have another round of medication for the Flu or what should she do.   Please advise

## 2016-03-28 NOTE — Telephone Encounter (Signed)
Patient aware of Dr.Reed's response and verbalized understanding 

## 2016-03-28 NOTE — Telephone Encounter (Signed)
Advised to wash and rinse all Parts of the CPAP that are accessible , mask, tubing, headgear and filer, clean water chamber, stay off CPAP for 7 days until you have over come the symptoms.  Patient indicated understanding.

## 2016-04-01 DIAGNOSIS — L821 Other seborrheic keratosis: Secondary | ICD-10-CM | POA: Diagnosis not present

## 2016-04-01 DIAGNOSIS — Z23 Encounter for immunization: Secondary | ICD-10-CM | POA: Diagnosis not present

## 2016-04-01 DIAGNOSIS — D2261 Melanocytic nevi of right upper limb, including shoulder: Secondary | ICD-10-CM | POA: Diagnosis not present

## 2016-04-01 DIAGNOSIS — D1800 Hemangioma unspecified site: Secondary | ICD-10-CM | POA: Diagnosis not present

## 2016-04-01 DIAGNOSIS — Z808 Family history of malignant neoplasm of other organs or systems: Secondary | ICD-10-CM | POA: Diagnosis not present

## 2016-04-01 DIAGNOSIS — F331 Major depressive disorder, recurrent, moderate: Secondary | ICD-10-CM | POA: Diagnosis not present

## 2016-04-01 DIAGNOSIS — D1722 Benign lipomatous neoplasm of skin and subcutaneous tissue of left arm: Secondary | ICD-10-CM | POA: Diagnosis not present

## 2016-04-01 DIAGNOSIS — L814 Other melanin hyperpigmentation: Secondary | ICD-10-CM | POA: Diagnosis not present

## 2016-04-01 DIAGNOSIS — D2271 Melanocytic nevi of right lower limb, including hip: Secondary | ICD-10-CM | POA: Diagnosis not present

## 2016-04-03 ENCOUNTER — Ambulatory Visit (INDEPENDENT_AMBULATORY_CARE_PROVIDER_SITE_OTHER): Payer: Medicare Other | Admitting: Obstetrics and Gynecology

## 2016-04-03 ENCOUNTER — Encounter: Payer: Self-pay | Admitting: Obstetrics and Gynecology

## 2016-04-03 VITALS — BP 122/70 | HR 84 | Temp 98.2°F | Resp 16 | Wt 194.0 lb

## 2016-04-03 DIAGNOSIS — N760 Acute vaginitis: Secondary | ICD-10-CM

## 2016-04-03 DIAGNOSIS — N898 Other specified noninflammatory disorders of vagina: Secondary | ICD-10-CM | POA: Diagnosis not present

## 2016-04-03 LAB — POCT URINALYSIS DIPSTICK
Bilirubin, UA: NEGATIVE
Blood, UA: NEGATIVE
Glucose, UA: NEGATIVE
Ketones, UA: NEGATIVE
Leukocytes, UA: NEGATIVE
Nitrite, UA: NEGATIVE
Protein, UA: NEGATIVE
Urobilinogen, UA: NEGATIVE
pH, UA: 7

## 2016-04-03 NOTE — Progress Notes (Signed)
GYNECOLOGY  VISIT   HPI: 76 y.o.   Married  Caucasian  female   G0P0 with Patient's last menstrual period was 02/25/1995 (approximate).   here c/o vaginal irritation. She has been on 2 rounds of antibiotics for sinusitis. Then took antibiotics for her teeth. She just started taking steroids for her sinuses. She was also treated for the flu last month.  She c/o vulvar irritation for the month. She took diflucan x 2 starting on 03/26/16 (needs brand name only). She is feeling better, not 100%, not itchy, just irritated.        GYNECOLOGIC HISTORY: Patient's last menstrual period was 02/25/1995 (approximate). Contraception:postmenopause  Menopausal hormone therapy: none         OB History    Gravida Para Term Preterm AB Living   0             SAB TAB Ectopic Multiple Live Births                     Patient Active Problem List   Diagnosis Date Noted  . Cough 03/06/2016  . Influenza due to identified novel influenza A virus with other respiratory manifestations 03/06/2016  . At risk for injury related to fall 02/14/2016  . Primary osteoarthritis of both hands 02/04/2016  . Osteopenia of multiple sites 02/04/2016  . Metatarsalgia of both feet 02/04/2016  . Migraines 02/04/2016  . Fibromyalgia 02/01/2016  . Other fatigue 02/01/2016  . Melena 08/20/2015  . Acute recurrent maxillary sinusitis 08/20/2015  . Slow transit constipation 08/20/2015  . Vitamin D deficiency 08/20/2015  . Thyroid activity decreased 08/20/2015  . Cephalalgia 08/20/2015  . Preop cardiovascular exam 07/20/2015  . Subacute ethmoidal sinusitis 12/11/2014  . Chronic infection of sinus 07/12/2014  . Nasal septal ulcer 05/25/2014  . 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 Deflected nasal septum 01/30/2014  . Hypertrophy of nasal turbinates 01/30/2014  . 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 Insomnia secondary to depression with anxiety 10/04/2013  . Chest pain 08/20/2013  .  Exertional dyspnea 08/20/2013  . History of rheumatic fever 08/20/2013  . Generalized anxiety disorder 08/09/2013  . Hypomania (mild) single episode or unspecified 08/09/2013  . OSA on CPAP 04/06/2013  . Postoperative anemia due to acute blood loss 11/09/2012  . OA (osteoarthritis) of knee 11/08/2012  . History of giardia infection 07/15/2012  . Atrophic vaginitis 07/15/2012  . Vitreous degeneration of right eye   . Nuclear sclerosis   . Right shoulder pain 06/18/2011  . Foot pain, left 06/18/2011  . DJD (degenerative joint disease) of knee 04/29/2011    Past Medical History:  Diagnosis Date  . 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 Constipation   . Depression   . Fibromyalgia 8/98   Truslow  . 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 Impaired hearing    left ear, wears hearing aids  . Insomnia    circadian rhythm component  . Insomnia   . Migraine 10/88   Spillman  . Nuclear sclerosis   . OSA on CPAP 2/07   uses cpap setting of 10  . Osteoarthritis 2007   Deveschwar  . Pain last week   left under breast pain   . Plantar fasciitis   . Scarlet fever as child  . Thyroid disorder   . Vitreous degeneration of right eye     Past Surgical History:  Procedure Laterality Date  . BREAST BIOPSY Left 6/00   [place]  .  DE QUERVAIN'S RELEASE Right 10/97   Sypher  . left breast biopsy    . ROOT CANAL  02/11/2012  . TONSILLECTOMY  age 60  . TONSILLECTOMY AND ADENOIDECTOMY  1952  . 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 TOTAL KNEE ARTHROPLASTY Right 11/08/2012   Procedure: RIGHT TOTAL KNEE ARTHROPLASTY;  Surgeon: Gearlean Alf, MD;  Location: [facility];  Service: Orthopedics;  Laterality: Right;  . TUBAL LIGATION      Current Outpatient Prescriptions  Medication Sig Dispense Refill  . aspirin EC 81 MG tablet Take 81 mg by mouth daily.    . Aspirin-Acetaminophen-Caffeine (EXCEDRIN MIGRAINE PO) Take by mouth daily.     Marland Kitchen azithromycin (ZITHROMAX) 250 MG tablet     .  Calcium-Magnesium-Vitamin D (CALCIUM MAGNESIUM PO) Take by mouth daily.    . CELEBREX 200 MG capsule TAKE ONE CAPSULE EVERY DAY 90 capsule 0  . Cholecalciferol (VITAMIN D-3) 1000 units CAPS Take by mouth daily.    Marland Kitchen doxepin (SINEQUAN) 100 MG capsule Take 1 capsule (100 mg total) by mouth at bedtime. 90 capsule 1  . Eszopiclone 3 MG TABS Take 1 tablet (3 mg total) by mouth at bedtime. Take immediately before bedtime 30 tablet 3  . levothyroxine (SYNTHROID, LEVOTHROID) 200 MCG tablet Take 200 mcg by mouth daily before breakfast.    . LORazepam (ATIVAN) 1 MG tablet Take 1 mg by mouth. TAKES TWO TABLET PO QHS    . Melatonin 3 MG TABS Take 10 mg by mouth daily.     . traMADol (ULTRAM) 50 MG tablet Take 1 tablet (50 mg total) by mouth 3 (three) times daily as needed. 90 tablet 1  . valACYclovir (VALTREX) 1000 MG tablet 1/2 a tablet BID x 3 days prn 30 tablet 1   No current facility-administered medications for this visit.      ALLERGIES: Penicillins; Codeine; Provigil [modafinil]; Seroquel [quetiapine fumarate]; Xyrem [sodium oxybate]; Ciprofloxacin; Monistat [miconazole]; and Terazol [terconazole]  Family History  Problem Relation Age of Onset  . Breast cancer Mother   . Aneurysm Father   . Migraines Father   . Breast cancer Maternal Aunt   . Breast cancer Maternal Aunt   . Breast cancer Maternal Aunt   . Breast cancer Maternal Aunt   . High blood pressure Brother   . Melanoma Brother   . Kidney cancer Brother     Lesion on kidney  . Heart disease      Grandmother    Social History   Social History  . Marital status: Married    Spouse name: [name]  . Number of children: 0  . Years of education: 14   Occupational History  . Not on file.   Social History Main Topics  . Smoking status: Passive Smoke Exposure - Never Smoker  . Smokeless tobacco: Never Used  . Alcohol use 0.0 oz/week     Comment: 1 glass - occas.  . Drug use: No  . Sexual activity: Not Currently    Partners:  Male    Birth control/ protection: Post-menopausal   Other Topics Concern  . Not on file   Social History Narrative   Patient is married [name]).   Patient drinks 2-4 cups of coffee daily.   Patient is retired.   Patient has two years of college.   Patient is right-handed.                   Review of Systems  Constitutional:  Fever off and on  HENT: Positive for congestion.        Runny nose  Eyes: Negative.   Respiratory: Negative.   Cardiovascular: Negative.   Gastrointestinal: Negative.   Genitourinary:       Vaginal irritation   Musculoskeletal: Negative.   Skin: Negative.   Neurological: Negative.   Endo/Heme/Allergies: Negative.   Psychiatric/Behavioral: Negative.     PHYSICAL EXAMINATION:    BP 122/70 (BP Location: Right Arm, Patient Position: Sitting, Cuff Size: Normal)   Pulse 84   Resp 16   Wt 194 lb (88 kg)   LMP 02/25/1995 (Approximate)   BMI 31.31 kg/m     General appearance: alert, cooperative and appears stated age  Pelvic: External genitalia:  no lesions, minimal erythema just lateral to the left labia minora              Urethra:  normal appearing urethra with no masses, tenderness or lesions              Bartholins and Skenes: normal                 Vagina: normal appearing atrophic vagina with normal color, no discharge, no lesions              Cervix: no lesions              Chaperone was present for exam.  Wet prep: no clue, no trich, + wbc, +parabaslar cells KOH: no yeast PH: 5.5   ASSESSMENT Vulvar irritation, improved s/p diflucan x 2 Negative vaginal slides    PLAN Wet prep probe Use Vaseline externally Will call if she wants to try a steroid ointment   An After Visit Summary was printed and given to the patient.

## 2016-04-04 LAB — WET PREP BY MOLECULAR PROBE
Candida species: NEGATIVE
Gardnerella vaginalis: NEGATIVE
Trichomonas vaginosis: NEGATIVE

## 2016-04-07 ENCOUNTER — Telehealth: Payer: Self-pay | Admitting: Obstetrics and Gynecology

## 2016-04-07 MED ORDER — BETAMETHASONE VALERATE 0.1 % EX OINT: TOPICAL_OINTMENT | CUTANEOUS | 0 refills | Status: DC

## 2016-04-07 NOTE — Telephone Encounter (Signed)
Patient was seen recently and Dr.Jertson offered to send in a prescription for a "cream". Patient declined at the time but she has changed her mind and would like the prescription sent to the pharmacy on file.

## 2016-04-07 NOTE — Telephone Encounter (Signed)
Script sent  

## 2016-04-07 NOTE — Telephone Encounter (Signed)
Dr. Talbert Nan, please see patient message below and advise.? Last OV dated 04/03/16 -patient to call if wants to try steroid ointment.

## 2016-04-11 ENCOUNTER — Ambulatory Visit
Admission: RE | Admit: 2016-04-11 | Discharge: 2016-04-11 | Disposition: A | Discharge status: Home / Self Care | Payer: Medicare Other | Source: Ambulatory Visit | Attending: Obstetrics & Gynecology | Admitting: Obstetrics & Gynecology

## 2016-04-11 DIAGNOSIS — Z1231 Encounter for screening mammogram for malignant neoplasm of breast: Secondary | ICD-10-CM

## 2016-04-15 ENCOUNTER — Ambulatory Visit: Payer: Medicare Other

## 2016-04-17 ENCOUNTER — Ambulatory Visit (INDEPENDENT_AMBULATORY_CARE_PROVIDER_SITE_OTHER): Payer: Medicare Other | Admitting: Sports Medicine

## 2016-04-17 ENCOUNTER — Encounter: Payer: Self-pay | Admitting: Sports Medicine

## 2016-04-17 DIAGNOSIS — G43009 Migraine without aura, not intractable, without status migrainosus: Secondary | ICD-10-CM

## 2016-04-17 DIAGNOSIS — M5030 Other cervical disc degeneration, unspecified cervical region: Secondary | ICD-10-CM | POA: Diagnosis not present

## 2016-04-17 DIAGNOSIS — F411 Generalized anxiety disorder: Secondary | ICD-10-CM | POA: Diagnosis not present

## 2016-04-17 DIAGNOSIS — M47812 Spondylosis without myelopathy or radiculopathy, cervical region: Secondary | ICD-10-CM

## 2016-04-17 MED ORDER — TRAMADOL HCL 50 MG PO TABS: 50.0000 mg | ORAL_TABLET | Freq: Three times a day (TID) | ORAL | 1 refills | Status: DC | PRN

## 2016-04-17 NOTE — Assessment & Plan Note (Signed)
Being managed by Dr Casimiro Needle and sleeping better now  He will mange lorazepam and lunesta dosing

## 2016-04-17 NOTE — Assessment & Plan Note (Addendum)
Stable with great improvement of sxs on current meds  Easy isometics Same med schedule  Cont to work on balance exercises as well

## 2016-04-17 NOTE — Progress Notes (Signed)
   ASHALEY PETERMAN - 76 y.o. female MRN [id number]  Date of birth: 1940/03/31  SUBJECTIVE:  Including CC & ROS.  No chief complaint on file. 76 yo F w/ PMH of DJD of c-spine w/ associated migraine HA's in for f/u.   Says pain relief has been great No migraines for 2 mos. Ice helps neck if she gets a start of migraine  Now on Doxepin 100 mg Tramadol 50 tid This combination controls neck pain Pain level is 3 to 4/10 with this  Now can sleep through night Dr Casimiro Needle gives her lorazepam and lunesta for this  Ros Minimal tingling into left hand - now less Grip is ok No fasiculations   HISTORY: Past Medical, Surgical, Social, and Family History Reviewed & Updated per EMR.   Pertinent Historical Findings include: Cervical DJD Migraines Insomnia  DATA REVIEWED: NA  PHYSICAL EXAM:  VS: BP:122/72  HR:75bpm  TEMP: ( )  RESP:   HT:5' 6.5" (168.9 cm)   WT:194 lb (88 kg)  BMI:30.9 PHYSICAL EXAM:  General: well-appearing F in NAD, A&Ox3  MSK: - reduced curvature of cervical spine - ROM reduced to 30 deg extension, normal flexion, 45 degrees external rotation left and 30 deg to RT - no cervical spinal tenderness - 5/5 strength to pincer grasp, arm flexion/extension, intrinsic muscles of hand - sensation grossly intact  ASSESSMENT & PLAN: See problem based charting & AVS for pt instructions. 76 yo F w/ cervical DJD and migraine HA's w/ cessation of HA's and reduction in neck pain on doxepin 100mg  once daily and tramadol 50mg  TID. Pt appears to manage these medications well w/o side-effects along w/ her usual lunesta and lorazapam for sleep. Will continue current regimen.   - Continue tramadol 50mg  TID - Continue Doxepin 100mg  once nightly - F/U q 3 mos

## 2016-04-17 NOTE — Assessment & Plan Note (Signed)
Much less problem with migraines now we are treating neck pain

## 2016-05-07 ENCOUNTER — Telehealth: Payer: Self-pay | Admitting: Rheumatology

## 2016-05-07 DIAGNOSIS — G4739 Other sleep apnea: Secondary | ICD-10-CM | POA: Diagnosis not present

## 2016-05-07 DIAGNOSIS — D126 Benign neoplasm of colon, unspecified: Secondary | ICD-10-CM | POA: Diagnosis not present

## 2016-05-07 DIAGNOSIS — M859 Disorder of bone density and structure, unspecified: Secondary | ICD-10-CM | POA: Diagnosis not present

## 2016-05-07 DIAGNOSIS — G43909 Migraine, unspecified, not intractable, without status migrainosus: Secondary | ICD-10-CM | POA: Diagnosis not present

## 2016-05-07 DIAGNOSIS — E038 Other specified hypothyroidism: Secondary | ICD-10-CM | POA: Diagnosis not present

## 2016-05-07 DIAGNOSIS — E559 Vitamin D deficiency, unspecified: Secondary | ICD-10-CM | POA: Diagnosis not present

## 2016-05-07 DIAGNOSIS — M1990 Unspecified osteoarthritis, unspecified site: Secondary | ICD-10-CM | POA: Diagnosis not present

## 2016-05-07 DIAGNOSIS — E784 Other hyperlipidemia: Secondary | ICD-10-CM | POA: Diagnosis not present

## 2016-05-07 DIAGNOSIS — Z1389 Encounter for screening for other disorder: Secondary | ICD-10-CM | POA: Diagnosis not present

## 2016-05-07 DIAGNOSIS — Z6831 Body mass index (BMI) 31.0-31.9, adult: Secondary | ICD-10-CM | POA: Diagnosis not present

## 2016-05-07 LAB — VITAMIN D 25 HYDROXY (VIT D DEFICIENCY, FRACTURES): Vit D, 25-Hydroxy: 29.8

## 2016-05-07 LAB — HEPATIC FUNCTION PANEL
ALT: 25 U/L (ref 7–35)
AST: 22 U/L (ref 13–35)
Alkaline Phosphatase: 72 U/L (ref 25–125)
Bilirubin, Direct: 0.1 mg/dL
Bilirubin, Total: 0.4 mg/dL

## 2016-05-07 LAB — LIPID PANEL
Cholesterol: 187 mg/dL (ref 0–200)
HDL: 48 mg/dL (ref 35–70)
LDL Cholesterol: 119 mg/dL
LDl/HDL Ratio: 2.5
Triglycerides: 101 mg/dL (ref 40–160)

## 2016-05-07 LAB — TSH: TSH: 0.01 u[IU]/mL — AB (ref 0.41–5.90)

## 2016-05-07 NOTE — Telephone Encounter (Signed)
Patient called wanting to know if we have received her lab results from Dr. Roque Cash.  ND#[phone]

## 2016-05-08 ENCOUNTER — Encounter: Payer: Self-pay | Admitting: Internal Medicine

## 2016-05-08 NOTE — Telephone Encounter (Signed)
Left message to advise patient we have not received the labs and to have them re faxed. Provided patient with fax number.

## 2016-05-13 ENCOUNTER — Telehealth: Payer: Self-pay | Admitting: Rheumatology

## 2016-05-13 NOTE — Telephone Encounter (Signed)
Patient calling re Celebrex. Does Dr. Forde Dandy have the correct blood work in order for her to have the Celebrex renewed?  Please advise ASAP

## 2016-05-14 NOTE — Telephone Encounter (Signed)
Patient advised that the lab work we received did not include a CBC or CMP. Patient advised we would need that in order to renew her Celebrex. Patient states she will come by the office to get those labs performed.

## 2016-05-15 ENCOUNTER — Other Ambulatory Visit: Payer: Self-pay | Admitting: *Deleted

## 2016-05-15 DIAGNOSIS — Z79899 Other long term (current) drug therapy: Secondary | ICD-10-CM

## 2016-05-15 LAB — CBC WITH DIFFERENTIAL/PLATELET
Basophils Absolute: 0 cells/uL (ref 0–200)
Basophils Relative: 0 %
Eosinophils Absolute: 207 cells/uL (ref 15–500)
Eosinophils Relative: 3 %
HCT: 44.8 % (ref 35.0–45.0)
Hemoglobin: 14.9 g/dL (ref 11.7–15.5)
Lymphocytes Relative: 27 %
Lymphs Abs: 1863 cells/uL (ref 850–3900)
MCH: 29 pg (ref 27.0–33.0)
MCHC: 33.3 g/dL (ref 32.0–36.0)
MCV: 87.3 fL (ref 80.0–100.0)
MPV: 11.3 fL (ref 7.5–12.5)
Monocytes Absolute: 483 cells/uL (ref 200–950)
Monocytes Relative: 7 %
Neutro Abs: 4347 cells/uL (ref 1500–7800)
Neutrophils Relative %: 63 %
Platelets: 232 10*3/uL (ref 140–400)
RBC: 5.13 MIL/uL — ABNORMAL HIGH (ref 3.80–5.10)
RDW: 13.9 % (ref 11.0–15.0)
WBC: 6.9 10*3/uL (ref 3.8–10.8)

## 2016-05-15 LAB — COMPLETE METABOLIC PANEL WITH GFR
ALT: 18 U/L (ref 6–29)
AST: 19 U/L (ref 10–35)
Albumin: 4.3 g/dL (ref 3.6–5.1)
Alkaline Phosphatase: 57 U/L (ref 33–130)
BUN: 25 mg/dL (ref 7–25)
CO2: 27 mmol/L (ref 20–31)
Calcium: 9.3 mg/dL (ref 8.6–10.4)
Chloride: 105 mmol/L (ref 98–110)
Creat: 0.74 mg/dL (ref 0.60–0.93)
GFR, Est African American: >89 mL/min (ref >=60)
GFR, Est Non African American: 80 mL/min (ref >=60)
Glucose, Bld: 103 mg/dL — ABNORMAL HIGH (ref 65–99)
Potassium: 4.2 mmol/L (ref 3.5–5.3)
Sodium: 141 mmol/L (ref 135–146)
Total Bilirubin: 0.5 mg/dL (ref 0.2–1.2)
Total Protein: 6.6 g/dL (ref 6.1–8.1)

## 2016-05-16 NOTE — Progress Notes (Signed)
Labs normal.

## 2016-05-20 ENCOUNTER — Ambulatory Visit: Payer: Medicare Other | Admitting: Obstetrics & Gynecology

## 2016-05-29 ENCOUNTER — Telehealth: Payer: Self-pay | Admitting: Internal Medicine

## 2016-05-29 NOTE — Telephone Encounter (Signed)
left msg asking pt to confirm this AWV (subsequent) w/ nurse and EV w/ Dr. Mariea Clonts. VDM (DD)

## 2016-05-30 DIAGNOSIS — H0015 Chalazion left lower eyelid: Secondary | ICD-10-CM | POA: Diagnosis not present

## 2016-05-30 DIAGNOSIS — H0012 Chalazion right lower eyelid: Secondary | ICD-10-CM | POA: Diagnosis not present

## 2016-06-06 ENCOUNTER — Other Ambulatory Visit: Payer: Self-pay | Admitting: Sports Medicine

## 2016-06-06 ENCOUNTER — Other Ambulatory Visit: Payer: Self-pay | Admitting: *Deleted

## 2016-06-06 ENCOUNTER — Other Ambulatory Visit: Payer: Self-pay | Admitting: Rheumatology

## 2016-06-06 MED ORDER — DOXEPIN HCL 100 MG PO CAPS: 100.0000 mg | ORAL_CAPSULE | Freq: Every day | ORAL | 1 refills | Status: DC

## 2016-06-06 NOTE — Telephone Encounter (Signed)
Last Visit: 02/05/16 Next Visit: 08/05/16  Labs: 05/15/16 WNL  Okay to refill Celebrex?

## 2016-06-06 NOTE — Telephone Encounter (Signed)
ok 

## 2016-06-19 DIAGNOSIS — J300 Vasomotor rhinitis: Secondary | ICD-10-CM | POA: Diagnosis not present

## 2016-07-07 ENCOUNTER — Ambulatory Visit (INDEPENDENT_AMBULATORY_CARE_PROVIDER_SITE_OTHER): Payer: Medicare Other | Admitting: Neurology

## 2016-07-07 ENCOUNTER — Encounter: Payer: Self-pay | Admitting: Neurology

## 2016-07-07 VITALS — BP 126/72 | HR 80 | Ht 66.5 in | Wt 199.0 lb

## 2016-07-07 DIAGNOSIS — G4733 Obstructive sleep apnea (adult) (pediatric): Secondary | ICD-10-CM

## 2016-07-07 DIAGNOSIS — Z9989 Dependence on other enabling machines and devices: Secondary | ICD-10-CM | POA: Diagnosis not present

## 2016-07-07 NOTE — Progress Notes (Signed)
[facility]                                                                                                                                                            SLEEP CLINIC NOTE                                                                                                                                                                    Provider:  Dr Brett Fairy, MD  Referring Provider: Gayland Curry, DO Primary Care Physician:  Gayland Curry, DO  OI:[id number] seen for compliance of CPAP machine. [name] is seen here today on 07/07/2016 for a compliance revisit. She knows that she is a very loud snorer with occasional apneas when not using CPAP and wants to continue CPAP therapy but her machine is now 76 years old and is not working as reliably as it used to. She also travels frequently and normal in machines are lighter in weight. She uses her machine 8 hours and 36 and minutes daily with a compliance of 93% set pressure of 10 cm water without EPR and a residual AHI of 1.5. She only has minor air leaks. Her new machine should be used at the same pressure that she is currently using. I would like to prescribe a model that could also be used as an Environmental manager. She needs heated humidity. She is sleeping much better.     01/08/2016 - scheduled revisit. 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 Over the last 10 years she has steadily gained weight, although she was able to reduce over 60 pounds by being in on the  [employer]. This required the intake of multiple supplements. She also tried a supplement that a friend had given her and responded with hypomania actually she developed mania and needed to be treated. She is on polypharmacy as pain  medication, insomnia medication and medication for bipolar  disorder have all been added over the last 5 years. She reports that the CPAP machine works and it is at this time for years old. As his machine has been covered by Medicare she cannot replace its until it is about 76 years of age. She does sleep better usually with CPAP. She recently went patient on [place] she only had one headache in a period of over 3 weeks. After returning to [facility] her headaches have resumed. She has a very disturbed sleep cycle since returning, and stated that this lorazepam and Lunesta she was still not able to find sleep in a reasonable time. Part of her medication as provided by Dr. Andreas Blower, and part by Dr. Casimiro Needle. Tramadol has been prescribed by Dr. Oneida Alar, Doxepin was prescribed by Dr. Casimiro Needle- Dr. Oneida Alar adjusted the dose to address neck pain as well as insomnia. Melatonin is not a respiratory suppressant or addictive drug. My goal today is to see if the patient could take higher dose of Lorazepam and if he could make the [facility] unnecessary rather than vice versa. We were able today to review the CPAP compliance which is 90% and very good, his average user time of 8 hours and 21 minutes, CPAP is set at 10 cm water pressure and AHI is 1.8, there are no significant air leaks noted. The patient can continue at the current settings and with the current interface.   Dr. Ferdinand Lango note:  HPI: [name] is a 76 y.o. female here as a follow up. history of generalized anxiety disorder, hypomania and insomnia. She started having migraines when she was 31. The migraine is in the forehead. Bilateral. Pounding, sensitive to sound and light. Her vision gets wavy before the headache. She saw an ENT at [facility] and diagnosed her with sinusitis and placed her on antibiotics. She is having 10 a month, increased in the last few months. Previously she only had one migraine a month. She still feels like her sinuses are involved with pressure and pain. She uses a cpap at night and  that is affecting her sinuses as well. Her face feels full, head feels full, pressure in the sinuses, can't breath through nostril, ears feel full. She has been traveling extensively within the [place] and overseas. Her ears have been popping for 2 weeks. She last traveled to [place], [place] earlier this month and came back because she was sick. She has nausea and vomiting. She is currently in the middle of her antibiotic regimen. Interval history: Migraines were getting more frequent and more painful. No headaches now for a week. Husband here with her. Also more pain in the neck. She has had a daily headache since April 22nd, daily migraines. Combined with heavy pollen and allergies, She was non compliant with medication and went off the medication. She has had neck pain due to degenerative arthritis. She has had neck pain, migraines, insomnia, it has been a "mess" per husband. ENT put her back in sinus medication which has helped. A week ago she was placed back on her neck pain medication. Doing better. For a week her headaches have been better. Headaches have been in the frontal area. She has nausea with the migraines. She take lorazepam, lunesta, tramadol, doxepin, melatonin all at night. Discussed polypharmacy, she really needs to meet with primary care to manage her medications, this combination can cause respiratory failure and confusion, increased risk of falls and other significant sequelae.  Advise not to take all at once and see physicians who prescribed all this at night to let them know she has multiple meds now so they are aware of everything she takes. Reviewed notes, labs and imaging from outside physicians, which showed: Patient has OSA on CPAP and follows with Dr Brett Fairy. She has a history of mania in response to medications.   Review of Systems: Patient complains of symptoms per HPI as well as the following symptoms: No CP, no SOB. Fatigue, light sensitivity, joint pain, back pain, aching  muscles, muscle cramps, neck pain, neck stiffness, headaches, agitation, agitation, nervous, decr concentration. Pertinent negatives per HPI. All others negative.  Social History   Social History  . Marital status: Married    Spouse name: [name]  . Number of children: 0  . Years of education: 14   Occupational History  . Not on file.   Social History Main Topics  . Smoking status: Passive Smoke Exposure - Never Smoker  . Smokeless tobacco: Never Used  . Alcohol use 0.0 oz/week     Comment: 1 glass - occas.  . Drug use: No  . Sexual activity: Not Currently    Partners: Male    Birth control/ protection: Post-menopausal   Other Topics Concern  . Not on file   Social History Narrative   Patient is married [name]).   Patient drinks 2-4 cups of coffee daily.   Patient is retired.   Patient has two years of college.   Patient is right-handed.                   Family History  Problem Relation Age of Onset  . Breast cancer Mother   . Aneurysm Father   . Migraines Father   . Breast cancer Maternal Aunt   . Breast cancer Maternal Aunt   . Breast cancer Maternal Aunt   . Breast cancer Maternal Aunt   . High blood pressure Brother   . Melanoma Brother   . Kidney cancer Brother        Lesion on kidney  . Heart disease Unknown        Grandmother    Past Medical History:  Diagnosis Date  . 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 Constipation   . Depression   . Fibromyalgia 8/98   [place]  . 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 Impaired hearing    left ear, wears hearing aids  . Insomnia    circadian rhythm component  . Insomnia   . Migraine 10/88   Spillman  . Nuclear sclerosis   . OSA on CPAP 2/07   uses cpap setting of 10  . Osteoarthritis 2007   Deveschwar  . Pain last week   left under breast pain   . Plantar fasciitis   . Scarlet fever as child  . Thyroid disorder   . Vitreous degeneration of right eye     Past Surgical History:    Procedure Laterality Date  . BREAST BIOPSY Left 6/00   [place]  . BREAST EXCISIONAL BIOPSY Left 1998  . DE QUERVAIN'S RELEASE Right 10/97   S[place]her  . left breast biopsy    . ROOT CANAL  02/11/2012  . TONSILLECTOMY  age 11  . TONSILLECTOMY AND ADENOIDECTOMY  1952  . 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 TOTAL KNEE ARTHROPLASTY Right 11/08/2012   Procedure: RIGHT TOTAL KNEE ARTHROPLASTY;  Surgeon: Gearlean Alf, MD;  Location: [facility];  Service:  Orthopedics;  Laterality: Right;  . TUBAL LIGATION      Current Outpatient Prescriptions  Medication Sig Dispense Refill  . aspirin EC 81 MG tablet Take 81 mg by mouth daily.    . Aspirin-Acetaminophen-Caffeine (EXCEDRIN MIGRAINE PO) Take by mouth daily.     Marland Kitchen azelastine (ASTELIN) 0.1 % nasal spray USE 1 SPRAY IN EACH NOSTRIL ONCE DAILY IN THE EVENING  3  . Calcium-Magnesium-Vitamin D (CALCIUM MAGNESIUM PO) Take by mouth daily.    . CELEBREX 200 MG capsule TAKE ONE CAPSULE EVERY DAY 90 capsule 0  . Cholecalciferol (VITAMIN D-3) 1000 units CAPS Take by mouth daily.    Marland Kitchen doxepin (SINEQUAN) 100 MG capsule Take 1 capsule (100 mg total) by mouth at bedtime. 90 capsule 1  . Eszopiclone 3 MG TABS Take 1 tablet (3 mg total) by mouth at bedtime. Take immediately before bedtime 30 tablet 3  . fluticasone (FLONASE) 50 MCG/ACT nasal spray USE 1 SPRAY IN EACH NOSTRIL ONCE DAILY IN THE MORNING  3  . levothyroxine (SYNTHROID, LEVOTHROID) 200 MCG tablet Take 200 mcg by mouth daily before breakfast.    . LORazepam (ATIVAN) 1 MG tablet Take 1 mg by mouth. TAKES TWO TABLET PO QHS    . Melatonin 3 MG TABS Take 10 mg by mouth daily.     . montelukast (SINGULAIR) 10 MG tablet Take 10 mg by mouth every evening.  3  . traMADol (ULTRAM) 50 MG tablet Take 1 tablet (50 mg total) by mouth 3 (three) times daily as needed. 270 tablet 1  . valACYclovir (VALTREX) 1000 MG tablet 1/2 a tablet BID x 3 days prn 30 tablet 1   No current facility-administered medications for  this visit.     Allergies as of 07/07/2016 - Review Complete 07/07/2016  Allergen Reaction Noted  . Penicillins Anaphylaxis 04/29/2011  . Codeine Nausea Only 04/29/2011  . Provigil [modafinil]  03/24/2013  . Seroquel [quetiapine fumarate]  07/20/2014  . Xyrem [sodium oxybate]  06/18/2011  . Ciprofloxacin Rash 07/01/2012  . Monistat [miconazole] Rash 07/01/2012  . Terazol [terconazole] Rash 07/01/2012    Vitals: BP 126/72   Pulse 80   Ht 5' 6.5" (1.689 m)   Wt 199 lb (90.3 kg)   LMP 02/25/1995 (Approximate)   BMI 31.64 kg/m  Last Weight:  Wt Readings from Last 1 Encounters:  07/07/16 199 lb (90.3 kg)   Last Height:   Ht Readings from Last 1 Encounters:  07/07/16 5' 6.5" (1.689 m)    Exam: Gen: NAD, conversant, well nourised, well groomed  CV: RRR, no MRG. No Carotid Bruits. No peripheral edema, warm, nontender Eyes: Conjunctivae clear without exudates or hemorrhage Neuro:Detailed Neurologic Exam Speech: Speech is normal; fluent and spontaneous with normal comprehension. No dysarthria, not stuttering.  Cognition:The patient is oriented to person, place, and time; recent and remote memory intact; language fluent;  normal attention, concentration. Cranial Nerves: The pupils are equal, round, and reactive to light. The fundi are normal and spontaneous venous pulsations are present. Visual fields are full to finger confrontation. Extraocular movements are intact. Trigeminal sensation is intact and the muscles of mastication are normal. The face is symmetric. The palate elevates in the midline. Hearing intact. Voice is normal. Shoulder shrug is normal. The tongue has normal motion without fasciculations. Gait: Heel-toe and tandem gait are normal.  no involuntary movements noted. Normal muscle tone, except for the cervical paraspinal muscles and shoulder. Very tense ! Strength is V/V in the upper and lower limbs.  1) I will order a new CPAP machine in  Dec 2018 with auto titration capacity for this patient, set at 10 cm without EPR , same interface. DME -[facility], please work with Jonni Sanger.     Larey Seat, [facility] [address] [place], [place] [address]  Phone [phone] Fax [phone]  A total of 20 minutes was spent face-to-face with this patient.  Over half this time was spent on counseling patient on the OSA treatment , her insomnia and therapeutic options available.

## 2016-07-07 NOTE — Progress Notes (Signed)
Pt asked me to reach out to [facility] to find out if she is eligible for a new cpap machine. [facility] reports that pt is not eligible for a new cpap until 01/29/2017.

## 2016-07-07 NOTE — Patient Instructions (Signed)
Insomnia Insomnia is a sleep disorder that makes it difficult to fall asleep or to stay asleep. Insomnia can cause tiredness (fatigue), low energy, difficulty concentrating, mood swings, and poor performance at work or school. There are three different ways to classify insomnia:  Difficulty falling asleep.  Difficulty staying asleep.  Waking up too early in the morning. Any type of insomnia can be long-term (chronic) or short-term (acute). Both are common. Short-term insomnia usually lasts for three months or less. Chronic insomnia occurs at least three times a week for longer than three months. What are the causes? Insomnia may be caused by another condition, situation, or substance, such as:  Anxiety.  Certain medicines.  Gastroesophageal reflux disease (GERD) or other gastrointestinal conditions.  Asthma or other breathing conditions.  Restless legs syndrome, sleep apnea, or other sleep disorders.  Chronic pain.  Menopause. This may include hot flashes.  Stroke.  Abuse of alcohol, tobacco, or illegal drugs.  Depression.  Caffeine.  Neurological disorders, such as Alzheimer disease.  An overactive thyroid (hyperthyroidism). The cause of insomnia may not be known. What increases the risk? Risk factors for insomnia include:  Gender. Women are more commonly affected than men.  Age. Insomnia is more common as you get older.  Stress. This may involve your professional or personal life.  Income. Insomnia is more common in people with lower income.  Lack of exercise.  Irregular work schedule or night shifts.  Traveling between different time zones. What are the signs or symptoms? If you have insomnia, trouble falling asleep or trouble staying asleep is the main symptom. This may lead to other symptoms, such as:  Feeling fatigued.  Feeling nervous about going to sleep.  Not feeling rested in the morning.  Having trouble concentrating.  Feeling irritable,  anxious, or depressed. How is this treated? Treatment for insomnia depends on the cause. If your insomnia is caused by an underlying condition, treatment will focus on addressing the condition. Treatment may also include:  Medicines to help you sleep.  Counseling or therapy.  Lifestyle adjustments. Follow these instructions at home:  Take medicines only as directed by your health care provider.  Keep regular sleeping and waking hours. Avoid naps.  Keep a sleep diary to help you and your health care provider figure out what could be causing your insomnia. Include:  When you sleep.  When you wake up during the night.  How well you sleep.  How rested you feel the next day.  Any side effects of medicines you are taking.  What you eat and drink.  Make your bedroom a comfortable place where it is easy to fall asleep:  Put up shades or special blackout curtains to block light from outside.  Use a white noise machine to block noise.  Keep the temperature cool.  Exercise regularly as directed by your health care provider. Avoid exercising right before bedtime.  Use relaxation techniques to manage stress. Ask your health care provider to suggest some techniques that may work well for you. These may include:  Breathing exercises.  Routines to release muscle tension.  Visualizing peaceful scenes.  Cut back on alcohol, caffeinated beverages, and cigarettes, especially close to bedtime. These can disrupt your sleep.  Do not overeat or eat spicy foods right before bedtime. This can lead to digestive discomfort that can make it hard for you to sleep.  Limit screen use before bedtime. This includes:  Watching TV.  Using your smartphone, tablet, and computer.  Stick to a   routine. This can help you fall asleep faster. Try to do a quiet activity, brush your teeth, and go to bed at the same time each night.  Get out of bed if you are still awake after 15 minutes of trying to  sleep. Keep the lights down, but try reading or doing a quiet activity. When you feel sleepy, go back to bed.  Make sure that you drive carefully. Avoid driving if you feel very sleepy.  Keep all follow-up appointments as directed by your health care provider. This is important. Contact a health care provider if:  You are tired throughout the day or have trouble in your daily routine due to sleepiness.  You continue to have sleep problems or your sleep problems get worse. Get help right away if:  You have serious thoughts about hurting yourself or someone else. This information is not intended to replace advice given to you by your health care provider. Make sure you discuss any questions you have with your health care provider. Document Released: 02/08/2000 Document Revised: 07/13/2015 Document Reviewed: 11/11/2013 Elsevier Interactive Patient Education  2017 Elsevier Inc.  

## 2016-07-15 ENCOUNTER — Encounter: Payer: Self-pay | Admitting: Sports Medicine

## 2016-07-15 ENCOUNTER — Ambulatory Visit (INDEPENDENT_AMBULATORY_CARE_PROVIDER_SITE_OTHER): Payer: Medicare Other | Admitting: Sports Medicine

## 2016-07-15 DIAGNOSIS — M797 Fibromyalgia: Secondary | ICD-10-CM | POA: Diagnosis not present

## 2016-07-15 DIAGNOSIS — M5030 Other cervical disc degeneration, unspecified cervical region: Secondary | ICD-10-CM

## 2016-07-15 DIAGNOSIS — M47812 Spondylosis without myelopathy or radiculopathy, cervical region: Secondary | ICD-10-CM

## 2016-07-15 NOTE — Progress Notes (Signed)
CC; neck pain from DDD  Patient is doing much better No Migraines ! 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 Palpation is normal with no tenderness over AC joint or bicipital groove. ROM is full in all planes. Rotator cuff strength normal throughout. No signs of impingement with negative Neer and Hawkin's tests, empty can sign. Speeds and Yergason's tests normal. No labral pathology noted with negative Obrien's, negative clunk and good stability. Normal scapular function observed. No painful arc and no drop arm sign. No apprehension sign  Neck motion is improved  No trap spasm  Left axilla pain over latissimus

## 2016-07-15 NOTE — Assessment & Plan Note (Signed)
Gen better I think from HEP Better pain control  No change in plan

## 2016-07-15 NOTE — Assessment & Plan Note (Signed)
Much improved and I want her to leave : Doxepin and tramadol at present dose  Work with personal trainer but stop overhead lifts and shrugs  Reck q 3 mos

## 2016-07-22 ENCOUNTER — Other Ambulatory Visit: Payer: Self-pay | Admitting: *Deleted

## 2016-07-22 MED ORDER — DOXEPIN HCL 100 MG PO CAPS: 100.0000 mg | ORAL_CAPSULE | Freq: Every day | ORAL | 1 refills | Status: DC

## 2016-07-29 ENCOUNTER — Encounter: Payer: Self-pay | Admitting: Internal Medicine

## 2016-07-31 DIAGNOSIS — Z96653 Presence of artificial knee joint, bilateral: Secondary | ICD-10-CM | POA: Insufficient documentation

## 2016-07-31 NOTE — Progress Notes (Signed)
Office Visit Note  Patient: [name]             Date of Birth: 01/25/1941           MRN: [id number]             PCP: Brenda Curry, DO Referring: Brenda Curry, DO Visit Date: 08/05/2016 Occupation: @GUAROCC @    Subjective:  Lower back pain, hip pain, hand pain   History of Present Illness: [name] is a 76 y.o. female with history of fibromyalgia and osteoarthritis according to her she's been having increased lower back pain. She's been having a lot of lower back pain for the last 3 days. She states she's been  using ice packs. She also has discomfort in her hips which she describes over trochanteric area. She has been having discomfort in her right thumb base. She complains of thoracic pain as well. She states her pain fluctuates with the weather change.  Activities of Daily Living:  Patient reports morning stiffness for 3-5  minutes.   Patient Reports nocturnal pain.  Difficulty dressing/grooming: Denies Difficulty climbing stairs: Denies Difficulty getting out of chair: Reports Difficulty using hands for taps, buttons, cutlery, and/or writing: Reports   Review of Systems  Constitutional: Positive for fatigue. Negative for night sweats, weight gain, weight loss and weakness.  HENT: Positive for mouth dryness. Negative for mouth sores, trouble swallowing, trouble swallowing and nose dryness.   Eyes: Positive for dryness. Negative for pain, redness and visual disturbance.  Respiratory: Negative for cough, shortness of breath and difficulty breathing.   Cardiovascular: Negative for chest pain, palpitations, hypertension, irregular heartbeat and swelling in legs/feet.  Gastrointestinal: Positive for constipation. Negative for blood in stool and diarrhea.  Endocrine: Negative for increased urination.  Genitourinary: Negative for vaginal dryness.  Musculoskeletal: Positive for myalgias, morning stiffness and myalgias. Negative for arthralgias, joint pain, joint  swelling, muscle weakness and muscle tenderness.  Skin: Negative for color change, rash, hair loss, skin tightness, ulcers and sensitivity to sunlight.  Allergic/Immunologic: Negative for susceptible to infections.  Neurological: Negative for dizziness, memory loss and night sweats.  Hematological: Negative for swollen glands.  Psychiatric/Behavioral: Positive for depressed mood and sleep disturbance. The patient is nervous/anxious.     PMFS History:  Patient Active Problem List   Diagnosis Date Noted  . History of total knee arthroplasty, 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  . Cough 03/06/2016  . Influenza due to identified novel influenza A virus with other respiratory manifestations 03/06/2016  . At risk for injury related to fall 02/14/2016  . Primary osteoarthritis of both hands 02/04/2016  . Osteopenia of multiple sites 02/04/2016  . Metatarsalgia of both feet 02/04/2016  . Migraines 02/04/2016  . Fibromyalgia 02/01/2016  . Other fatigue 02/01/2016  . Melena 08/20/2015  . Acute recurrent maxillary sinusitis 08/20/2015  . Slow transit constipation 08/20/2015  . Vitamin D deficiency 08/20/2015  . Thyroid activity decreased 08/20/2015  . Cephalalgia 08/20/2015  . Preop cardiovascular exam 07/20/2015  . Subacute ethmoidal sinusitis 12/11/2014  . Chronic infection of sinus 07/12/2014  . Nasal septal ulcer 05/25/2014  . 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 Deflected nasal septum 01/30/2014  . Hypertrophy of nasal turbinates 01/30/2014  . 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 Insomnia secondary to depression with anxiety 10/04/2013  . Chest pain 08/20/2013  . Exertional dyspnea 08/20/2013  . History of rheumatic fever 08/20/2013  . Generalized anxiety disorder 08/09/2013  . Hypomania (mild) single episode or  unspecified 08/09/2013  . OSA on CPAP 04/06/2013  . Postoperative anemia due to acute blood loss 11/09/2012  . OA (osteoarthritis) of knee  11/08/2012  . History of giardia infection 07/15/2012  . Atrophic vaginitis 07/15/2012  . Vitreous degeneration of right eye   . Nuclear sclerosis   . Right shoulder pain 06/18/2011  . Foot pain, left 06/18/2011    Past Medical History:  Diagnosis Date  . 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 Constipation   . Depression   . Fibromyalgia 8/98   Truslow  . 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 Impaired hearing    left ear, wears hearing aids  . Insomnia    circadian rhythm component  . Insomnia   . Migraine 10/88   Spillman  . Nuclear sclerosis   . OSA on CPAP 2/07   uses cpap setting of 10  . Osteoarthritis 2007   Deveschwar  . Pain last week   left under breast pain   . Plantar fasciitis   . Scarlet fever as child  . Thyroid disorder   . Vitreous degeneration of right eye     Family History  Problem Relation Age of Onset  . Breast cancer Mother   . Aneurysm Father   . Migraines Father   . Breast cancer Maternal Aunt   . Breast cancer Maternal Aunt   . Breast cancer Maternal Aunt   . Breast cancer Maternal Aunt   . High blood pressure Brother   . Melanoma Brother   . Kidney cancer Brother        Lesion on kidney  . Heart disease Unknown        Grandmother   Past Surgical History:  Procedure Laterality Date  . BREAST BIOPSY Left 6/00   [place]  . BREAST EXCISIONAL BIOPSY Left 1998  . DE QUERVAIN'S RELEASE Right 10/97   Sypher  . left breast biopsy    . ROOT CANAL  02/11/2012  . TONSILLECTOMY  age 3  . TONSILLECTOMY AND ADENOIDECTOMY  1952  . 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 TOTAL KNEE ARTHROPLASTY Right 11/08/2012   Procedure: RIGHT TOTAL KNEE ARTHROPLASTY;  Surgeon: Gearlean Alf, MD;  Location: [facility];  Service: Orthopedics;  Laterality: Right;  . TUBAL LIGATION     Social History   Social History Narrative   Patient is married [name]).   Patient drinks 2-4 cups of coffee daily.   Patient is retired.   Patient has two years  of college.   Patient is right-handed.                    Objective: Vital Signs: BP 124/74   Pulse 78   Resp 16   Ht 5' 6.5" (1.689 m)   Wt 194 lb (88 kg)   LMP 02/25/1995 (Approximate)   BMI 30.84 kg/m    Physical Exam  Constitutional: She is oriented to person, place, and time. She appears well-developed and well-nourished.  HENT:  Head: Normocephalic and atraumatic.  Eyes: Conjunctivae and EOM are normal.  Neck: Normal range of motion.  Cardiovascular: Normal rate, regular rhythm, normal heart sounds and intact distal pulses.   Pulmonary/Chest: Effort normal and breath sounds normal.  Abdominal: Soft. Bowel sounds are normal.  Lymphadenopathy:    She has no cervical adenopathy.  Neurological: She is alert and oriented to person, place, and time.  Skin: Skin is warm and dry. Capillary refill takes less than 2 seconds.  Psychiatric: She has a normal mood and affect. Her behavior is normal.  Nursing note and vitals reviewed.    Musculoskeletal Exam: C-spine and thoracic spine good range of motion. She is some discomfort range of motion of her lumbar spine no point tenderness. Shoulder joints elbow joints wrist joints are good range of motion. She had tenderness over right CMC joint. She has mild osteoarthritic changes in her hands. Hip joints knee joints ankles MTPs PIPs DIPs with good range of motion. She has osteoarthritic changes in her PIPs of feet. She has tenderness over bilateral trochanteric bursa area. Fibromyalgia tender points were 12 out of 18 positive.  CDAI Exam: No CDAI exam completed.    Investigation: No additional findings. CBC Latest Ref Rng & Units 05/15/2016 08/27/2015 11/10/2012  WBC 3.8 - 10.8 K/uL 6.9 4.0 11.5(H)  Hemoglobin 11.7 - 15.5 g/dL 14.9 14.1 11.5(L)  Hematocrit 35.0 - 45.0 % 44.8 42.1 34.2(L)  Platelets 140 - 400 K/uL 232 210 198   CMP Latest Ref Rng & Units 05/15/2016 05/07/2016 12/28/2015  Glucose 65 - 99 mg/dL 103(H) - -  BUN 7 - 25  mg/dL 25 - 19  Creatinine 0.60 - 0.93 mg/dL 0.74 - 0.7  Sodium 135 - 146 mmol/L 141 - 143  Potassium 3.5 - 5.3 mmol/L 4.2 - 4.4  Chloride 98 - 110 mmol/L 105 - -  CO2 20 - 31 mmol/L 27 - -  Calcium 8.6 - 10.4 mg/dL 9.3 - -  Total Protein 6.1 - 8.1 g/dL 6.6 - -  Total Bilirubin 0.2 - 1.2 mg/dL 0.5 - -  Alkaline Phos 33 - 130 U/L 57 72 -  AST 10 - 35 U/L 19 22 -  ALT 6 - 29 U/L 18 25 -     Imaging: No results found.  Speciality Comments: No specialty comments available.    Procedures:  No procedures performed Allergies: Penicillins; Codeine; Provigil [modafinil]; Seroquel [quetiapine fumarate]; Xyrem [sodium oxybate]; Ciprofloxacin; Monistat [miconazole]; and Terazol [terconazole]   Assessment / Plan:     Visit Diagnoses: Fibromyalgia: She has generalized pain and positive tender points and myalgias. She's having a flare currently. She is taking Celebrex which is helpful. Her labs are stable we'll continue to monitor her labs every 6 months.  Other fatigue: She continues to have fatigue  Insomnia secondary to depression with anxiety: Better with medications  Primary osteoarthritis of both hands: She is having increased pain in her right thumb. Right CMC brace prescription was given.  History of total knee arthroplasty, bilateral: Doing fairly well  Metatarsalgia of both feet: Proper fitting shoes were discussed.: Minimal discomfort  Bilateral trochanteric bursitis: ITB and exercises handout was given.  DJD (degenerative joint disease), cervical  Osteopenia of multiple sites: DEXA followed by PCP  Vitamin D deficiency: We'll check vitamin D level with the next labs  At risk for injury related to fall  History of migraine  History of sleep apnea: Followed up by neurology    Orders: No orders of the defined types were placed in this encounter.  No orders of the defined types were placed in this encounter.   Face-to-face time spent with patient was 30 minutes.  50% of time was spent in counseling and coordination of care.  Follow-Up Instructions: Return in about 6 months (around 02/04/2017) for FMS,OA.   Bo Merino, MD  Note - This record has been created using Editor, commissioning.  Chart creation errors have been sought, but may not always  have been located.  Such creation errors do not reflect on  the standard of medical care.

## 2016-08-05 ENCOUNTER — Ambulatory Visit (INDEPENDENT_AMBULATORY_CARE_PROVIDER_SITE_OTHER): Payer: Medicare Other | Admitting: Rheumatology

## 2016-08-05 ENCOUNTER — Encounter: Payer: Self-pay | Admitting: Rheumatology

## 2016-08-05 VITALS — BP 124/74 | HR 78 | Resp 16 | Ht 66.5 in | Wt 194.0 lb

## 2016-08-05 DIAGNOSIS — M797 Fibromyalgia: Secondary | ICD-10-CM

## 2016-08-05 DIAGNOSIS — Z8669 Personal history of other diseases of the nervous system and sense organs: Secondary | ICD-10-CM | POA: Diagnosis not present

## 2016-08-05 DIAGNOSIS — Z96653 Presence of artificial knee joint, bilateral: Secondary | ICD-10-CM

## 2016-08-05 DIAGNOSIS — Z9181 History of falling: Secondary | ICD-10-CM

## 2016-08-05 DIAGNOSIS — M8589 Other specified disorders of bone density and structure, multiple sites: Secondary | ICD-10-CM | POA: Diagnosis not present

## 2016-08-05 DIAGNOSIS — M7741 Metatarsalgia, right foot: Secondary | ICD-10-CM

## 2016-08-05 DIAGNOSIS — R5383 Other fatigue: Secondary | ICD-10-CM | POA: Diagnosis not present

## 2016-08-05 DIAGNOSIS — F5105 Insomnia due to other mental disorder: Secondary | ICD-10-CM

## 2016-08-05 DIAGNOSIS — M19042 Primary osteoarthritis, left hand: Secondary | ICD-10-CM | POA: Diagnosis not present

## 2016-08-05 DIAGNOSIS — M5030 Other cervical disc degeneration, unspecified cervical region: Secondary | ICD-10-CM | POA: Diagnosis not present

## 2016-08-05 DIAGNOSIS — E559 Vitamin D deficiency, unspecified: Secondary | ICD-10-CM | POA: Diagnosis not present

## 2016-08-05 DIAGNOSIS — M19041 Primary osteoarthritis, right hand: Secondary | ICD-10-CM

## 2016-08-05 DIAGNOSIS — M47812 Spondylosis without myelopathy or radiculopathy, cervical region: Secondary | ICD-10-CM

## 2016-08-05 DIAGNOSIS — M7742 Metatarsalgia, left foot: Secondary | ICD-10-CM

## 2016-08-05 DIAGNOSIS — F418 Other specified anxiety disorders: Secondary | ICD-10-CM

## 2016-08-05 NOTE — Patient Instructions (Addendum)
CBC, CMP, Vitamin D due in September Iliotibial Bursitis Rehab Ask your health care provider which exercises are safe for you. Do exercises exactly as told by your health care provider and adjust them as directed. It is normal to feel mild stretching, pulling, tightness, or discomfort as you do these exercises, but you should stop right away if you feel sudden pain or your pain gets worse.Do not begin these exercises until told by your health care provider. Stretching and range of motion exercises These exercises warm up your muscles and joints and improve the movement and flexibility of your leg. These exercises also help to relieve pain and stiffness. Exercise A: Quadriceps stretch, prone  1. Lie on your abdomen on a firm surface, such as a bed or padded floor. 2. Bend your left / right knee and hold your ankle. If you cannot reach your ankle or pant leg, loop a belt around your foot and grab the belt instead. 3. Gently pull your heel toward your buttocks. Your knee should not slide out to the side. You should feel a stretch in the front of your thigh and knee. 4. Hold this position for __________ seconds. Repeat __________ times. Complete this exercise __________ times a day. Exercise B: Lunge ( adductor stretch) 1. Stand and spread your legs about 3 feet (about 1 m) apart. Put your left / right leg slightly back for balance. 2. Lean away from your left / right leg by bending your other knee and shifting your weight toward your bent knee. You may rest your hands on your thigh for balance. You should feel a stretch in your left / right inner thigh. 3. Hold for __________ seconds. Repeat __________ times. Complete this exercise __________ times a day. Exercise C: Hamstring stretch, supine  1. Lie on your back. 2. Hold both ends of a belt or towel as you loop it over the ball of your left / right foot. The ball of your foot is on the walking surface, right under your toes. 3. Straighten your  left / right knee and slowly pull on the belt to raise your leg. Stop when you feel a gentle stretch in the back of your left / right knee or thigh. ? Do not let your left / right knee bend. ? Keep your other leg flat on the floor. 4. Hold this position for __________ seconds. Repeat __________ times. Complete this exercise __________ times a day. Strengthening exercises These exercises build strength and endurance in your leg. Endurance is the ability to use your muscles for a long time, even after they get tired. Exercise D: Quadriceps wall slides  1. Lean your back against a smooth wall or door while you walk your feet out 18-24 inches (46-61 cm) from it. 2. Place your feet hip-width apart. 3. Slowly slide down the wall or door until your knees bend as far as told by your health care provider. Keep your knees over your heels, not your toes. Keep your knees in line with your hips. 4. Hold for __________ seconds. 5. Push through your heels to stand up to rest for __________ seconds after each repetition. Repeat __________ times. Complete this exercise __________ times a day. Exercise E: Straight leg raises ( hip abductors) 1. Lie on your side, with your left / right leg in the top position. Lie so your head, shoulder, knee, and hip line up with each other. You may bend your bottom knee to help you balance. 2. Lift your top leg  4-6 inches (10-15 cm) while keeping your toes pointed straight ahead. 3. Hold this position for __________ seconds. 4. Slowly lower your leg to the starting position. Allow your muscles to relax completely after each repetition. Repeat __________ times. Complete this exercise __________ times a day. Exercise F: Straight leg raises ( hip extensors) 1. Lie on your abdomen on a firm surface. You can put a pillow under your hips if that is more comfortable. 2. Tense the muscles in your buttocks and lift your left / right leg about 4-6 inches (10-15 cm). Keep your knee  straight as you lift your leg. 3. Hold this position for __________ seconds. 4. Slowly lower your leg to the starting position. 5. Let your leg relax completely after each repetition. Repeat __________ times. Complete this exercise __________ times a day. Exercise G: Bridge ( hip extensors) 1. Lie on your back on a firm surface with your knees bent and your feet flat on the floor. 2. Tighten your buttocks muscles and lift your bottom off the floor until your trunk is level with your thighs. ? Do not arch your back. ? You should feel the muscles working in your buttocks and the back of your thighs. If you do not feel these muscles, slide your feet 1-2 inches (2.5-5 cm) farther away from your buttocks. 3. Hold this position for __________ seconds. 4. Slowly lower your hips to the starting position. 5. Let your buttocks muscles relax completely between repetitions. 6. If this exercise is too easy, try doing it with your arms crossed over your chest. Repeat __________ times. Complete this exercise __________ times a day. This information is not intended to replace advice given to you by your health care provider. Make sure you discuss any questions you have with your health care provider. Document Released: 02/10/2005 Document Revised: 10/18/2015 Document Reviewed: 01/23/2015 Elsevier Interactive Patient Education  [employer].

## 2016-08-06 ENCOUNTER — Telehealth: Payer: Self-pay | Admitting: Radiology

## 2016-08-06 NOTE — Telephone Encounter (Signed)
Patient states she was here yesterday. Did get a brace order from [place] was sent to [facility]  They did not want to order the brace for patient.  To you FYI

## 2016-08-21 ENCOUNTER — Encounter: Payer: Medicare Other | Admitting: Internal Medicine

## 2016-08-24 ENCOUNTER — Other Ambulatory Visit (HOSPITAL_COMMUNITY): Payer: Self-pay | Admitting: Psychiatry

## 2016-08-24 ENCOUNTER — Other Ambulatory Visit: Payer: Self-pay | Admitting: Rheumatology

## 2016-08-25 NOTE — Telephone Encounter (Signed)
Last Visit: 08/05/16 Next Visit: 02/03/17 Labs: 05/15/16 WNL  Okay to refill per Dr. Estanislado Pandy

## 2016-09-16 DIAGNOSIS — F331 Major depressive disorder, recurrent, moderate: Secondary | ICD-10-CM | POA: Diagnosis not present

## 2016-09-23 ENCOUNTER — Ambulatory Visit (INDEPENDENT_AMBULATORY_CARE_PROVIDER_SITE_OTHER): Payer: Medicare Other

## 2016-09-23 VITALS — BP 122/60 | HR 71 | Temp 98.0°F | Ht 67.0 in | Wt 186.0 lb

## 2016-09-23 DIAGNOSIS — Z0000 Encounter for general adult medical examination without abnormal findings: Secondary | ICD-10-CM

## 2016-09-23 NOTE — Progress Notes (Signed)
Subjective:   [name] is a 76 y.o. female who presents for Medicare Annual (Subsequent) preventive examination.  Last AWV-08/31/15    Objective:     Vitals: BP 122/60 (BP Location: Right Arm, Patient Position: Sitting)   Pulse 71   Temp 98 F (36.7 C) (Oral)   Ht 5\' 7"  (1.702 m)   Wt 186 lb (84.4 kg)   LMP 02/25/1995 (Approximate)   SpO2 95%   BMI 29.13 kg/m   Body mass index is 29.13 kg/m.   Tobacco History  Smoking Status  . Passive Smoke Exposure - Never Smoker  . Years: 30.00  Smokeless Tobacco  . Never Used    Comment: flight attendant on smoking plane      Counseling given: Not Answered   Past Medical History:  Diagnosis Date  . 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 Constipation   . Depression   . Fibromyalgia 8/98   Tr[place]low  . 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 Impaired hearing    left ear, wears hearing aids  . Insomnia    circadian rhythm component  . Insomnia   . Migraine 10/88   Spillman  . Nuclear sclerosis   . OSA on CPAP 2/07   uses cpap setting of 10  . Osteoarthritis 2007   Deveschwar  . Pain last week   left under breast pain   . Plantar fasciitis   . Scarlet fever as child  . Thyroid disorder   . Vitreous degeneration of right eye    Past Surgical History:  Procedure Laterality Date  . BREAST BIOPSY Left 6/00   [place]  . BREAST EXCISIONAL BIOPSY Left 1998  . DE QUERVAIN'S RELEASE Right 10/97   Sypher  . left breast biopsy    . ROOT CANAL  02/11/2012  . TONSILLECTOMY  age 53  . TONSILLECTOMY AND ADENOIDECTOMY  1952  . 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 TOTAL KNEE ARTHROPLASTY Right 11/08/2012   Procedure: RIGHT TOTAL KNEE ARTHROPLASTY;  Surgeon: Gearlean Alf, MD;  Location: [facility];  Service: Orthopedics;  Laterality: Right;  . TUBAL LIGATION     Family History  Problem Relation Age of Onset  . Breast cancer Mother   . Aneurysm Father   . Migraines Father   . Breast cancer Maternal Aunt   .  Breast cancer Maternal Aunt   . Breast cancer Maternal Aunt   . Breast cancer Maternal Aunt   . High blood pressure Brother   . Melanoma Brother   . Kidney cancer Brother        Lesion on kidney  . Heart disease Unknown        Grandmother   History  Sexual Activity  . Sexual activity: Not Currently  . Partners: Male  . Birth control/ protection: Post-menopausal    Outpatient Encounter Prescriptions as of 09/23/2016  Medication Sig  . aspirin EC 81 MG tablet Take 81 mg by mouth daily.  . Aspirin-Acetaminophen-Caffeine (EXCEDRIN MIGRAINE PO) Take by mouth daily.   Marland Kitchen azelastine (ASTELIN) 0.1 % nasal spray USE 1 SPRAY IN EACH NOSTRIL ONCE DAILY IN THE EVENING  . Calcium-Magnesium-Vitamin D (CALCIUM MAGNESIUM PO) Take by mouth daily.  . CELEBREX 200 MG capsule TAKE ONE CAPSULE EVERY DAY  . doxepin (SINEQUAN) 100 MG capsule Take 1 capsule (100 mg total) by mouth at bedtime.  . Eszopiclone 3 MG TABS Take 1 tablet (3 mg total) by mouth at bedtime. Take immediately before bedtime  .  fluticasone (FLONASE) 50 MCG/ACT nasal spray USE 1 SPRAY IN EACH NOSTRIL ONCE DAILY IN THE MORNING  . levothyroxine (SYNTHROID, LEVOTHROID) 200 MCG tablet Take 200 mcg by mouth daily before breakfast.  . LORazepam (ATIVAN) 1 MG tablet Take 1 mg by mouth. TAKES TWO TABLET PO QHS  . Melatonin 3 MG TABS Take 10 mg by mouth daily.   . montelukast (SINGULAIR) 10 MG tablet Take 10 mg by mouth every evening.  . Omega-3 Fatty Acids (FISH OIL) 1000 MG CAPS Take 1,000 mg by mouth daily.  . polyethylene glycol (MIRALAX / GLYCOLAX) packet Take 17 g by mouth daily.  . traMADol (ULTRAM) 50 MG tablet Take 1 tablet (50 mg total) by mouth 3 (three) times daily as needed.  . valACYclovir (VALTREX) 1000 MG tablet 1/2 a tablet BID x 3 days prn  . [DISCONTINUED] Cholecalciferol (VITAMIN D-3) 1000 units CAPS Take by mouth daily.   No facility-administered encounter medications on file as of 09/23/2016.     Activities of Daily  Living In your present state of health, do you have any difficulty performing the following activities: 09/23/2016  Hearing? N  Vision? N  Difficulty concentrating or making decisions? Y  Comment short term memory   Walking or climbing stairs? Y  Comment sob  Dressing or bathing? N  Doing errands, shopping? N  Preparing Food and eating ? N  Using the Toilet? N  In the past six months, have you accidently leaked urine? N  Do you have problems with loss of bowel control? N  Managing your Medications? N  Managing your Finances? N  Housekeeping or managing your Housekeeping? N  Some recent data might be hidden    [name], [name] Megan Salon, MD as Consulting Physician (Gynecology) Tiajuana Amass, MD as Referring Physician (Allergy and Immunology) Jari Pigg, MD as Consulting Physician (Dermatology) Luberta Mutter, MD as Consulting Physician (Ophthalmology) Plonk, Vivien Presto, MD as Referring Physician (Otolaryngology) Dohmeier, Asencion Partridge, MD as Consulting Physician (Neurology) Reynold Bowen, MD as Consulting Physician (Endocrinology)    Assessment:     Exercise Activities and Dietary recommendations Current Exercise Habits: Home exercise routine;Structured exercise class, Type of exercise: Other - see comments;walking (water aerobics ), Time (Minutes): 60, Frequency (Times/Week): 3, Weekly Exercise (Minutes/Week): 180, Intensity: Mild, Exercise limited by: None identified  Goals    None     Fall Risk Fall Risk  09/23/2016 04/17/2016 01/22/2016 01/04/2016 08/31/2015  Falls in the past year? Yes No No Yes No  Number falls in past yr: 2 or more - - 2 or more -  Comment - - - 6 times  -  Injury with Fall? No - No No -  Risk for fall due to : - - Other (Comment) - -   Depression Screen PHQ 2/9 Scores 09/23/2016 04/17/2016 01/22/2016 08/31/2015  PHQ - 2 Score 6 0 0 2  PHQ- 9 Score 15 - - -  Exception Documentation - -  Other- indicate reason in comment box -     Cognitive Function MMSE - Mini Mental State Exam 09/23/2016 08/31/2015  Orientation to time 5 4  Orientation to Place 5 5  Registration 3 3  Attention/ Calculation 5 4  Recall 2 3  Language- name 2 objects 2 2  Language- repeat 1 1  Language- follow 3 step command 3 1  Language- read & follow direction 1 1  Write a sentence 1 1  Copy design  1 1  Total score 29 26        Immunization History  Administered Date(s) Administered  . Pneumococcal Polysaccharide-23 11/01/2012  . Tdap 10/10/2010  . Zoster 02/24/2002  . Zoster Recombinat (Shingrix) 06/24/2016   Screening Tests Health Maintenance  Topic Date Due  . PNA vac Low Risk Adult (2 of 2 - PCV13) 11/01/2013  . MAMMOGRAM  04/11/2017  . COLONOSCOPY  09/11/2018  . TETANUS/TDAP  10/09/2020  . DEXA SCAN  Completed      Plan:     I have personally reviewed and addressed the Medicare Annual Wellness questionnaire and have noted the following in the patient's chart:  A. Medical and social history B. Use of alcohol, tobacco or illicit drugs  C. Current medications and supplements D. Functional ability and status E.  Nutritional status F.  Physical activity G. Advance directives H. List of other physicians I.  Hospitalizations, surgeries, and ER visits in previous 12 months J.  [place] to include hearing, vision, cognitive, depression L. Referrals and appointments - none  In addition, I have reviewed and discussed with patient certain preventive protocols, quality metrics, and best practice recommendations. A written personalized care plan for preventive services as well as general preventive health recommendations were provided to patient.  See attached scanned questionnaire for additional information.   Signed,   Rich Reining, RN Nurse Health Advisor  Quick Notes   Health Maintenance: up to date      Abnormal Screen: MMSE- 29 out of 30, passed clock  exam, PHQ9- 15     Patient Concerns: none      Nurse Concerns: Vit B and Vit D levels checked based on past levels and patient request

## 2016-09-23 NOTE — Patient Instructions (Addendum)
[name] , Thank you for taking time to come for your Medicare Wellness Visit. I appreciate your ongoing commitment to your health goals. Please review the following plan we discussed and let me know if I can assist you in the future.   Screening recommendations/referrals: Colonoscopy due 09/11/2018 Mammogram due 04/11/2017 Bone Density up to date  Recommended yearly ophthalmology/optometry visit for glaucoma screening and checkup Recommended yearly dental visit for hygiene and checkup  Vaccinations: Influenza vaccine due 2018 fall season  Pneumococcal vaccine up to date  Tdap vaccine up to date, due 10/09/2020 Shingles vaccine due for second shot within 4 months   Advanced directives: in chart   Conditions/risks identified: Vit B and Vit D levels checked  Next appointment: 09/25/2016 Dr. Mariea Clonts @ 1:30 pm.   Preventive Care 65 Years and Older, Female Preventive care refers to lifestyle choices and visits with your health care provider that can promote health and wellness. What does preventive care include?  A yearly physical exam. This is also called an annual well check.  Dental exams once or twice a year.  Routine eye exams. Ask your health care provider how often you should have your eyes checked.  Personal lifestyle choices, including:  Daily care of your teeth and gums.  Regular physical activity.  Eating a healthy diet.  Avoiding tobacco and drug use.  Limiting alcohol use.  Practicing safe sex.  Taking low-dose aspirin every day.  Taking vitamin and mineral supplements as recommended by your health care provider. What happens during an annual well check? The services and screenings done by your health care provider during your annual well check will depend on your age, overall health, lifestyle risk factors, and family history of disease. Counseling  Your health care provider may ask you questions about your:  Alcohol use.  Tobacco use.  Drug  use.  Emotional well-being.  Home and relationship well-being.  Sexual activity.  Eating habits.  History of falls.  Memory and ability to understand (cognition).  Work and work Statistician.  Reproductive health. Screening  You may have the following tests or measurements:  Height, weight, and BMI.  Blood pressure.  Lipid and cholesterol levels. These may be checked every 5 years, or more frequently if you are over 29 years old.  Skin check.  Lung cancer screening. You may have this screening every year starting at age 9 if you have a 30-pack-year history of smoking and currently smoke or have quit within the past 15 years.  Fecal occult blood test (FOBT) of the stool. You may have this test every year starting at age 14.  Flexible sigmoidoscopy or colonoscopy. You may have a sigmoidoscopy every 5 years or a colonoscopy every 10 years starting at age 66.  Hepatitis C blood test.  Hepatitis B blood test.  Sexually transmitted disease (STD) testing.  Diabetes screening. This is done by checking your blood sugar (glucose) after you have not eaten for a while (fasting). You may have this done every 1-3 years.  Bone density scan. This is done to screen for osteoporosis. You may have this done starting at age 47.  Mammogram. This may be done every 1-2 years. Talk to your health care provider about how often you should have regular mammograms. Talk with your health care provider about your test results, treatment options, and if necessary, the need for more tests. Vaccines  Your health care provider may recommend certain vaccines, such as:  Influenza vaccine. This is recommended every year.  Tetanus, diphtheria, and acellular pertussis (Tdap, Td) vaccine. You may need a Td booster every 10 years.  Zoster vaccine. You may need this after age 44.  Pneumococcal 13-valent conjugate (PCV13) vaccine. One dose is recommended after age 55.  Pneumococcal polysaccharide  (PPSV23) vaccine. One dose is recommended after age 5. Talk to your health care provider about which screenings and vaccines you need and how often you need them. This information is not intended to replace advice given to you by your health care provider. Make sure you discuss any questions you have with your health care provider. Document Released: 03/09/2015 Document Revised: 10/31/2015 Document Reviewed: 12/12/2014 Elsevier Interactive Patient Education  2017 [place] Prevention in the Home Falls can cause injuries. They can happen to people of all ages. There are many things you can do to make your home safe and to help prevent falls. What can I do on the outside of my home?  Regularly fix the edges of walkways and driveways and fix any cracks.  Remove anything that might make you trip as you walk through a door, such as a raised step or threshold.  Trim any bushes or trees on the path to your home.  Use bright outdoor lighting.  Clear any walking paths of anything that might make someone trip, such as rocks or tools.  Regularly check to see if handrails are loose or broken. Make sure that both sides of any steps have handrails.  Any raised decks and porches should have guardrails on the edges.  Have any leaves, snow, or ice cleared regularly.  Use sand or salt on walking paths during winter.  Clean up any spills in your garage right away. This includes oil or grease spills. What can I do in the bathroom?  Use night lights.  Install grab bars by the toilet and in the tub and shower. Do not use towel bars as grab bars.  Use non-skid mats or decals in the tub or shower.  If you need to sit down in the shower, use a plastic, non-slip stool.  Keep the floor dry. Clean up any water that spills on the floor as soon as it happens.  Remove soap buildup in the tub or shower regularly.  Attach bath mats securely with double-sided non-slip rug tape.  Do not have  throw rugs and other things on the floor that can make you trip. What can I do in the bedroom?  Use night lights.  Make sure that you have a light by your bed that is easy to reach.  Do not use any sheets or blankets that are too big for your bed. They should not hang down onto the floor.  Have a firm chair that has side arms. You can use this for support while you get dressed.  Do not have throw rugs and other things on the floor that can make you trip. What can I do in the kitchen?  Clean up any spills right away.  Avoid walking on wet floors.  Keep items that you use a lot in easy-to-reach places.  If you need to reach something above you, use a strong step stool that has a grab bar.  Keep electrical cords out of the way.  Do not use floor polish or wax that makes floors slippery. If you must use wax, use non-skid floor wax.  Do not have throw rugs and other things on the floor that can make you trip. What can I do  with my stairs?  Do not leave any items on the stairs.  Make sure that there are handrails on both sides of the stairs and use them. Fix handrails that are broken or loose. Make sure that handrails are as long as the stairways.  Check any carpeting to make sure that it is firmly attached to the stairs. Fix any carpet that is loose or worn.  Avoid having throw rugs at the top or bottom of the stairs. If you do have throw rugs, attach them to the floor with carpet tape.  Make sure that you have a light switch at the top of the stairs and the bottom of the stairs. If you do not have them, ask someone to add them for you. What else can I do to help prevent falls?  Wear shoes that:  Do not have high heels.  Have rubber bottoms.  Are comfortable and fit you well.  Are closed at the toe. Do not wear sandals.  If you use a stepladder:  Make sure that it is fully opened. Do not climb a closed stepladder.  Make sure that both sides of the stepladder are  locked into place.  Ask someone to hold it for you, if possible.  Clearly mark and make sure that you can see:  Any grab bars or handrails.  First and last steps.  Where the edge of each step is.  Use tools that help you move around (mobility aids) if they are needed. These include:  Canes.  Walkers.  Scooters.  Crutches.  Turn on the lights when you go into a dark area. Replace any light bulbs as soon as they burn out.  Set up your furniture so you have a clear path. Avoid moving your furniture around.  If any of your floors are uneven, fix them.  If there are any pets around you, be aware of where they are.  Review your medicines with your doctor. Some medicines can make you feel dizzy. This can increase your chance of falling. Ask your doctor what other things that you can do to help prevent falls. This information is not intended to replace advice given to you by your health care provider. Make sure you discuss any questions you have with your health care provider. Document Released: 12/07/2008 Document Revised: 07/19/2015 Document Reviewed: 03/17/2014 Elsevier Interactive Patient Education  2017 [employer].

## 2016-09-25 ENCOUNTER — Encounter: Payer: Self-pay | Admitting: Internal Medicine

## 2016-09-25 ENCOUNTER — Ambulatory Visit (INDEPENDENT_AMBULATORY_CARE_PROVIDER_SITE_OTHER): Payer: Medicare Other | Admitting: Internal Medicine

## 2016-09-25 ENCOUNTER — Ambulatory Visit: Payer: Medicare Other

## 2016-09-25 VITALS — BP 130/70 | HR 79 | Temp 98.3°F | Wt 186.0 lb

## 2016-09-25 DIAGNOSIS — M797 Fibromyalgia: Secondary | ICD-10-CM | POA: Diagnosis not present

## 2016-09-25 DIAGNOSIS — F339 Major depressive disorder, recurrent, unspecified: Secondary | ICD-10-CM | POA: Diagnosis not present

## 2016-09-25 DIAGNOSIS — K5909 Other constipation: Secondary | ICD-10-CM | POA: Diagnosis not present

## 2016-09-25 DIAGNOSIS — F418 Other specified anxiety disorders: Secondary | ICD-10-CM

## 2016-09-25 DIAGNOSIS — G4733 Obstructive sleep apnea (adult) (pediatric): Secondary | ICD-10-CM

## 2016-09-25 DIAGNOSIS — M5030 Other cervical disc degeneration, unspecified cervical region: Secondary | ICD-10-CM

## 2016-09-25 DIAGNOSIS — F5105 Insomnia due to other mental disorder: Secondary | ICD-10-CM | POA: Diagnosis not present

## 2016-09-25 DIAGNOSIS — R5382 Chronic fatigue, unspecified: Secondary | ICD-10-CM

## 2016-09-25 DIAGNOSIS — Z9989 Dependence on other enabling machines and devices: Secondary | ICD-10-CM

## 2016-09-25 DIAGNOSIS — G9332 Myalgic encephalomyelitis/chronic fatigue syndrome: Secondary | ICD-10-CM

## 2016-09-25 NOTE — Progress Notes (Signed)
Provider:  Rexene Edison. Mariea Clonts, D.O., C.M.D. Location:   [facility]   Place of Service:   clinic  Previous PCP: Gayland Curry, DO [name], [name] Megan Salon, MD as Consulting Physician (Gynecology) Tiajuana Amass, MD as Referring Physician (Allergy and Immunology) Jari Pigg, MD as Consulting Physician (Dermatology) Luberta Mutter, MD as Consulting Physician (Ophthalmology) Plonk, Vivien Presto, MD as Referring Physician (Otolaryngology) Dohmeier, Asencion Partridge, MD as Consulting Physician (Neurology) Reynold Bowen, MD as Consulting Physician (Endocrinology)  Extended Emergency Contact Information Primary Emergency Contact: Gullatt,Larry Address: [address]          [place], [place] [address] Johnnette Litter of [place] Phone: [phone] Mobile Phone: [phone] Relation: Spouse  Code Status: full code Goals of Care: Advanced Directive information Advanced Directives 01/22/2016  Does Patient Have a Medical Advance Directive? No  Type of Advance Directive -  [place] in Chart? -  Would patient like information on creating a medical advance directive? -  Pre-existing out of facility DNR order (yellow form or pink MOST form) -  Some encounter information is confidential and restricted. Go to Review Flowsheets activity to see all data.   Chief Complaint  Patient presents with  . Medical Management of Chronic Issues    follow-up    HPI: Patient is a 76 y.o. female seen today for med mgt.   She is concerned about her husband's pulse being low.  She's fearful that something will happen to him and she'll be alone.  She had a full house of children, grandchildren and dogs.  She is exhausted.    Dr. Oneida Alar advised against her going to a chiropractor and wanted her to continue her meds as previously ordered.    Depression: she says she was on antidepressants for years and never felt better on than  off them.  She does not want more meds.  She stays in pain all of the time.  Does not feel good.  Says she misplaces items often.  She still sees Dr. Casimiro Needle.   She has lost 8 lbs.  Says she tried all kinds of fad diets, but went back to weightwatchers.  She's been losing 0.2 to 3 lbs per week.  Is walking, going to water aerobics and not drinking hardly at all.  Walking 3-4 times per week 2.8 miles.  She says she struggles when it's hot.  She's on a high dose of levothyroxine.  She says that was found incidentally and then she was referred to Dr. Forde Dandy.  Goes to him every 6 mos. Has been on that dose for years now.   She has a family history of diabetes.   Takes miralax and if she goes 2-3 days w/o bms, she takes gummy fiber supplements, if still not luck, drinks citrucel.    She is sensitive to the light.  Is bothered by the light of her husband's ipad.  Has had three repeated tests and using the cpap makes a huge difference.  Has been very faithful with use.    Past Medical History:  Diagnosis Date  . 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 Constipation   . Depression   . Fibromyalgia 8/98   [place]  . 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 Impaired hearing    left ear, wears hearing aids  . Insomnia    circadian rhythm component  . Insomnia   . Migraine 10/88  [place]  . Nuclear sclerosis   . OSA on CPAP 2/07   uses cpap setting of 10  . Osteoarthritis 2007   [place]  . Pain last week   left under breast pain   . Plantar fasciitis   . Scarlet fever as child  . Thyroid disorder   . Vitreous degeneration of right eye    Past Surgical History:  Procedure Laterality Date  . BREAST BIOPSY Left 6/00   [place]  . BREAST EXCISIONAL BIOPSY Left 1998  . DE QUERVAIN'S RELEASE Right 10/97   [place]  . left breast biopsy    . ROOT CANAL  02/11/2012  . TONSILLECTOMY  age 49  . TONSILLECTOMY AND ADENOIDECTOMY  1952  . 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 TOTAL KNEE  ARTHROPLASTY Right 11/08/2012   Procedure: RIGHT TOTAL KNEE ARTHROPLASTY;  Surgeon: Gearlean Alf, MD;  Location: [facility];  Service: Orthopedics;  Laterality: Right;  . TUBAL LIGATION      reports that she is a non-smoker but has been exposed to tobacco smoke. She has been exposed to tobacco smoke for the past 30.00 years. She has never used smokeless tobacco. She reports that she drinks alcohol. She reports that she does not use drugs.  Functional Status Survey:  independent  Family History  Problem Relation Age of Onset  . Breast cancer Mother   . Aneurysm Father   . Migraines Father   . Breast cancer Maternal Aunt   . Breast cancer Maternal Aunt   . Breast cancer Maternal Aunt   . Breast cancer Maternal Aunt   . High blood pressure Brother   . Melanoma Brother   . Kidney cancer Brother        Lesion on kidney  . Heart disease Unknown        Grandmother    Health Maintenance  Topic Date Due  . MAMMOGRAM  04/11/2017  . COLONOSCOPY  09/11/2018  . TETANUS/TDAP  10/09/2020  . DEXA SCAN  Completed  . PNA vac Low Risk Adult  Completed    Allergies  Allergen Reactions  . Penicillins Anaphylaxis    Tongue swelling  . Codeine Nausea Only  . Provigil [Modafinil]   . Seroquel [Quetiapine Fumarate]   . Xyrem [Sodium Oxybate]     hallucination  . Ciprofloxacin Rash  . Monistat [Miconazole] Rash  . Terazol [Terconazole] Rash    Allergies as of 09/25/2016      Reactions   Penicillins Anaphylaxis   Tongue swelling   Codeine Nausea Only   Provigil [modafinil]    Seroquel [quetiapine Fumarate]    Xyrem [sodium Oxybate]    hallucination   Ciprofloxacin Rash   Monistat [miconazole] Rash   Terazol [terconazole] Rash      Medication List       Accurate as of 09/25/16  1:56 PM. Always use your most recent med list.          aspirin EC 81 MG tablet Take 81 mg by mouth daily.   azelastine 0.1 % nasal spray Commonly known as:  ASTELIN USE 1 SPRAY IN EACH NOSTRIL ONCE  DAILY IN THE EVENING   CALCIUM MAGNESIUM PO Take by mouth daily.   CELEBREX 200 MG capsule Generic drug:  celecoxib TAKE ONE CAPSULE EVERY DAY   doxepin 100 MG capsule Commonly known as:  SINEQUAN Take 1 capsule (100 mg total) by mouth at bedtime.   Eszopiclone 3 MG Tabs Take 1 tablet (3 mg total) by mouth at  bedtime. Take immediately before bedtime   EXCEDRIN MIGRAINE PO Take by mouth daily.   Fish Oil 1000 MG Caps Take 1,000 mg by mouth daily.   fluticasone 50 MCG/ACT nasal spray Commonly known as:  FLONASE USE 1 SPRAY IN EACH NOSTRIL ONCE DAILY IN THE MORNING   levothyroxine 200 MCG tablet Commonly known as:  SYNTHROID, LEVOTHROID Take 200 mcg by mouth daily before breakfast.   LORazepam 1 MG tablet Commonly known as:  ATIVAN Take 1 mg by mouth. TAKES TWO TABLET PO QHS   Melatonin 3 MG Tabs Take 10 mg by mouth daily.   montelukast 10 MG tablet Commonly known as:  SINGULAIR Take 10 mg by mouth every evening.   polyethylene glycol packet Commonly known as:  MIRALAX / GLYCOLAX Take 17 g by mouth daily.   traMADol 50 MG tablet Commonly known as:  ULTRAM Take 1 tablet (50 mg total) by mouth 3 (three) times daily as needed.   valACYclovir 1000 MG tablet Commonly known as:  VALTREX 1/2 a tablet BID x 3 days prn       Review of Systems  Constitutional: Positive for malaise/fatigue and weight loss. Negative for chills and fever.  HENT: Negative for congestion.   Eyes: Negative for blurred vision.  Respiratory: Negative for shortness of breath.   Cardiovascular: Negative for chest pain, palpitations and leg swelling.  Gastrointestinal: Positive for constipation. Negative for abdominal pain, blood in stool, diarrhea and melena.  Genitourinary: Negative for dysuria.  Musculoskeletal: Positive for myalgias and neck pain. Negative for falls.  Skin: Negative for itching and rash.  Neurological: Negative for dizziness, loss of consciousness and weakness.    Endo/Heme/Allergies: Does not bruise/bleed easily.  Psychiatric/Behavioral: Positive for depression. Negative for memory loss. The patient is nervous/anxious and has insomnia.     Vitals:   09/25/16 1344  BP: 130/70  Pulse: 79  Temp: 98.3 F (36.8 C)  TempSrc: Oral  SpO2: 94%  Weight: 186 lb (84.4 kg)   Body mass index is 29.13 kg/m. Physical Exam  Constitutional: She is oriented to person, place, and time. She appears well-developed and well-nourished. No distress.  Cardiovascular: Normal rate, regular rhythm, normal heart sounds and intact distal pulses.   Pulmonary/Chest: Effort normal and breath sounds normal. No respiratory distress.  Abdominal: Soft. Bowel sounds are normal. She exhibits no distension. There is no tenderness.  Musculoskeletal: Normal range of motion.  Neurological: She is alert and oriented to person, place, and time.  Skin: Skin is warm and dry. Capillary refill takes less than 2 seconds.  Psychiatric: She has a normal mood and affect.    Labs reviewed: Basic Metabolic Panel:  Recent Labs  12/28/15 05/15/16 1510  NA 143 141  K 4.4 4.2  CL  --  105  CO2  --  27  GLUCOSE  --  103*  BUN 19 25  CREATININE 0.7 0.74  CALCIUM  --  9.3   Liver Function Tests:  Recent Labs  05/07/16 05/15/16 1510  AST 22 19  ALT 25 18  ALKPHOS 72 57  BILITOT  --  0.5  PROT  --  6.6  ALBUMIN  --  4.3   No results for input(s): LIPASE, AMYLASE in the last 8760 hours. No results for input(s): AMMONIA in the last 8760 hours. CBC:  Recent Labs  05/15/16 1510  WBC 6.9  NEUTROABS 4,347  HGB 14.9  HCT 44.8  MCV 87.3  PLT 232   Cardiac Enzymes: No results for  input(s): CKTOTAL, CKMB, CKMBINDEX, TROPONINI in the last 8760 hours. BNP: Invalid input(s): POCBNP Lab Results  Component Value Date   HGBA1C 5.5 08/27/2015   Lab Results  Component Value Date   TSH 0.01 (A) 05/07/2016   Assessment/Plan 1. Depression, recurrent ([place]) -refuses medication,  has tried many before that did not help -is back on an exercise regimen which should help her confidence as she loses weight and hopefully the activity will also brighten her spirits  2. Chronic fatigue fibromyalgia syndrome -ongoing, has opted to continue her pain regimen rather than pursuing alternatives thought I did advise against aggressive cracking of her neck as part of the chiropractic technique, muscle therapies could help  3. OSA on CPAP -cont cpap which helps tremendously she reports (though still does not sleep well)  4. Degenerative disc disease, cervical -cont chronic pain regimen per Dr. Oneida Alar  5. Insomnia secondary to depression with anxiety -as above, remains problematic for her  6. Other constipation -cont current otc regimen, improved hydration, fiber and exercise  Labs/tests ordered:  No new--gets full panel at endocrine  [name], D.O. [facility] [address], [place] [address] Cell Phone (Mon-Fri 8am-5pm):  [phone] On Call:  [phone] & follow prompts after 5pm & weekends Office Phone:  [phone] Office Fax:  [phone]

## 2016-10-21 ENCOUNTER — Encounter: Payer: Self-pay | Admitting: Sports Medicine

## 2016-10-21 ENCOUNTER — Ambulatory Visit (INDEPENDENT_AMBULATORY_CARE_PROVIDER_SITE_OTHER): Payer: Medicare Other | Admitting: Sports Medicine

## 2016-10-21 DIAGNOSIS — M47812 Spondylosis without myelopathy or radiculopathy, cervical region: Secondary | ICD-10-CM

## 2016-10-21 DIAGNOSIS — M170 Bilateral primary osteoarthritis of knee: Secondary | ICD-10-CM

## 2016-10-21 DIAGNOSIS — M5030 Other cervical disc degeneration, unspecified cervical region: Secondary | ICD-10-CM

## 2016-10-21 MED ORDER — DOXEPIN HCL 100 MG PO CAPS: 100.0000 mg | ORAL_CAPSULE | Freq: Every day | ORAL | 12 refills | Status: DC

## 2016-10-21 MED ORDER — TRAMADOL HCL 50 MG PO TABS: 50.0000 mg | ORAL_TABLET | Freq: Three times a day (TID) | ORAL | 3 refills | Status: DC | PRN

## 2016-10-21 NOTE — Assessment & Plan Note (Signed)
This is under improved control with combination of doxepin and tramadol  I have tried to wean the use but she gets increased pain as well as more frequent migraine headaches  Plan to leave her at 100 mg of doxepineach bedtime and 50 of tramadol 3 times a day  Recheck in 3 months

## 2016-10-21 NOTE — Assessment & Plan Note (Signed)
And some stationary cycling when she gets a chance as this may help her knees  Continue with your walking program

## 2016-10-21 NOTE — Progress Notes (Signed)
Chief complaint - neck pain and migraine headaches  Patient with significant cervical degenerative disc disease She continues working on posture and easy neck motion This has clearly helped Use of doxepin 100 mg at night has stopped most of the trapezius and upper arm radiation We combine this with tramadol 50 three times a day That has allowed her to have better control of arthritic pain in her hands neck and knees  The interesting aspect of treating her neck symptoms is that she has had less migraines Over the past year then she has had in the past 20 years She keeps a headache diary for frequency  During the past 3 months she had one migraine lasting several days after riding a long time in a car  Past history Bilateral knee replacements Working with a personal trainer to improve her mobility Now walking 2 miles 5 times a week  Review of systems Swelling in both knees if too much walking Improved sleep pattern on doxepin Less overall fatigue  Physical exam Pleasant female in no acute distress BP 112/62   Ht 5' 6.5" (1.689 m)   Wt 194 lb (88 kg)   LMP 02/25/1995 (Approximate)   BMI 30.84 kg/m   Neck range of motion is still mildly limited but is not painful No trapezius spasm Normal upper extremity function  Knees today reveal no significant effusion Midline scar bilaterally Extension is full Flexion is limited to 120  1 foot balance is improved on both left and right This is still difficult although she is improving

## 2016-11-11 DIAGNOSIS — E559 Vitamin D deficiency, unspecified: Secondary | ICD-10-CM | POA: Diagnosis not present

## 2016-11-11 DIAGNOSIS — E784 Other hyperlipidemia: Secondary | ICD-10-CM | POA: Diagnosis not present

## 2016-11-11 DIAGNOSIS — R635 Abnormal weight gain: Secondary | ICD-10-CM | POA: Diagnosis not present

## 2016-11-11 DIAGNOSIS — M8580 Other specified disorders of bone density and structure, unspecified site: Secondary | ICD-10-CM | POA: Diagnosis not present

## 2016-11-11 DIAGNOSIS — D126 Benign neoplasm of colon, unspecified: Secondary | ICD-10-CM | POA: Insufficient documentation

## 2016-11-11 DIAGNOSIS — Z79899 Other long term (current) drug therapy: Secondary | ICD-10-CM | POA: Diagnosis not present

## 2016-11-11 DIAGNOSIS — G43909 Migraine, unspecified, not intractable, without status migrainosus: Secondary | ICD-10-CM | POA: Diagnosis not present

## 2016-11-11 DIAGNOSIS — Z6829 Body mass index (BMI) 29.0-29.9, adult: Secondary | ICD-10-CM | POA: Diagnosis not present

## 2016-11-11 DIAGNOSIS — E038 Other specified hypothyroidism: Secondary | ICD-10-CM | POA: Diagnosis not present

## 2016-11-11 DIAGNOSIS — G4739 Other sleep apnea: Secondary | ICD-10-CM | POA: Diagnosis not present

## 2016-11-11 DIAGNOSIS — M797 Fibromyalgia: Secondary | ICD-10-CM | POA: Diagnosis not present

## 2016-11-26 ENCOUNTER — Ambulatory Visit (INDEPENDENT_AMBULATORY_CARE_PROVIDER_SITE_OTHER): Payer: Medicare Other | Admitting: Obstetrics and Gynecology

## 2016-11-26 ENCOUNTER — Encounter: Payer: Self-pay | Admitting: Obstetrics and Gynecology

## 2016-11-26 VITALS — BP 102/60 | HR 72 | Resp 16 | Wt 185.0 lb

## 2016-11-26 DIAGNOSIS — N762 Acute vulvitis: Secondary | ICD-10-CM | POA: Diagnosis not present

## 2016-11-26 DIAGNOSIS — R350 Frequency of micturition: Secondary | ICD-10-CM

## 2016-11-26 DIAGNOSIS — N6459 Other signs and symptoms in breast: Secondary | ICD-10-CM | POA: Diagnosis not present

## 2016-11-26 LAB — POCT URINALYSIS DIPSTICK
Bilirubin, UA: NEGATIVE
Blood, UA: NEGATIVE
Glucose, UA: NEGATIVE
Ketones, UA: NEGATIVE
Nitrite, UA: NEGATIVE
Protein, UA: NEGATIVE
Urobilinogen, UA: NEGATIVE E.U./dL — AB
pH, UA: 6.5 (ref 5.0–8.0)

## 2016-11-26 MED ORDER — BETAMETHASONE VALERATE 0.1 % EX OINT: TOPICAL_OINTMENT | CUTANEOUS | 0 refills | Status: DC
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
## 2016-11-26 NOTE — Progress Notes (Signed)
Patient scheduled while in office for Right breast diagnostic MMG and [place]. Spoke with Fabby at [facility]. Patient scheduled for 11/28/16 arriving at 7:50am for 8:10am appointment. Patient verbalizes understanding and is agreeable.

## 2016-11-27 LAB — URINALYSIS, MICROSCOPIC ONLY
Casts: NONE SEEN /lpf
Epithelial Cells (non renal): NONE SEEN /hpf (ref 0–10)

## 2016-11-27 LAB — URINE CULTURE: Organism ID, Bacteria: NO GROWTH

## 2016-11-27 LAB — VAGINITIS/VAGINOSIS, DNA PROBE
Candida Species: NEGATIVE
Gardnerella vaginalis: NEGATIVE
Trichomonas vaginosis: NEGATIVE

## 2016-11-28 ENCOUNTER — Ambulatory Visit
Admission: RE | Admit: 2016-11-28 | Discharge: 2016-11-28 | Disposition: A | Discharge status: Home / Self Care | Payer: Medicare Other | Source: Ambulatory Visit | Attending: Obstetrics and Gynecology | Admitting: Obstetrics and Gynecology

## 2016-11-28 ENCOUNTER — Telehealth: Payer: Self-pay | Admitting: Obstetrics and Gynecology

## 2016-11-28 DIAGNOSIS — R922 Inconclusive mammogram: Secondary | ICD-10-CM | POA: Diagnosis not present

## 2016-11-28 DIAGNOSIS — N6489 Other specified disorders of breast: Secondary | ICD-10-CM | POA: Diagnosis not present

## 2016-11-28 DIAGNOSIS — N6459 Other signs and symptoms in breast: Secondary | ICD-10-CM

## 2016-11-28 NOTE — Telephone Encounter (Signed)
Spoke with the patient, reviewed her normal urine culture. Discussed her breast imaging. She would prefer to see a female breast Psychologist, sport and exercise.  I have asked [name] to call and try to arrange this.

## 2016-11-28 NOTE — Telephone Encounter (Signed)
The following mychart message was sent:  [name], Your urine culture was negative for infection.  I saw the mammogram and ultrasound reports from this morning and see the recommendation for you to be referred to a breast surgeon. I called to check on you and to see if someone from the breast center is setting you up for that appointment (I left a message). If there is anything I can do to help please call the office.  Sumner Boast  If the patient calls, please let me know, if I'm not available please have Verline Lema speak with her.

## 2016-12-01 ENCOUNTER — Telehealth: Payer: Self-pay | Admitting: *Deleted

## 2016-12-01 NOTE — Telephone Encounter (Signed)
Per result note, patient requested Dr Talbert Nan attempt referral to female breast surgeon.  Call to [facility]. Patient is scheduled to see Dr Rush Farmer on 12-08-16 at 1020. First available appointment with Dr Barry Dienes is in November. Patient was advised of this and agreed to earlier appointment with Dr Rush Farmer.  Call to patient confirmed information as above. Patient states she is appreciative to Dr Talbert Nan for attempt.  Routing to provider for final review. Patient agreeable to disposition. Will close encounter.

## 2016-12-05 ENCOUNTER — Telehealth: Payer: Self-pay | Admitting: Obstetrics and Gynecology

## 2016-12-05 NOTE — Telephone Encounter (Signed)
Spoke with patient. Patient has additional questions about her appointment with [facility] on Monday 10/15 at 10:20am. Patient has questions  specific to breast biopsy procedures.  Recommended patient review questions at [place] with Surgeon on Monday. Patient is agreeable.  Patient ask that if Dr. Talbert Nan is available for return call, she has some additional questions for her. Advised patient Dr. Talbert Nan is out of the office on Fridays. Advised patient will review with clinical nursing supervisor and return call with any additional recommendations. Patient thankful for return call and is agreeable.   Routing to clinical nursing supervisor.   Cc: Dr. Talbert Nan
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
## 2016-12-05 NOTE — Telephone Encounter (Signed)
Returned call to the patient, left a message that I called

## 2016-12-05 NOTE — Telephone Encounter (Signed)
Patient says she is getting a Mychart message and is not sure if it's regarding an appointment she has scheduled on Monday for a breast issue.

## 2016-12-08 ENCOUNTER — Telehealth: Payer: Self-pay | Admitting: Obstetrics and Gynecology

## 2016-12-08 ENCOUNTER — Other Ambulatory Visit: Payer: Self-pay | Admitting: Surgery

## 2016-12-08 DIAGNOSIS — Z23 Encounter for immunization: Secondary | ICD-10-CM | POA: Diagnosis not present

## 2016-12-08 DIAGNOSIS — N6453 Retraction of nipple: Secondary | ICD-10-CM | POA: Diagnosis not present

## 2016-12-08 NOTE — Telephone Encounter (Signed)
Patient want to let Dr Talbert Nan know she seen Dr Ninfa Linden this morning and everything is fine. She go back to see him in 4 weeks.

## 2016-12-08 NOTE — Telephone Encounter (Signed)
Left message to call Haylie Mccutcheon at [phone].  

## 2016-12-10 NOTE — Telephone Encounter (Signed)
Routing to Dr. Rosann Auerbach, ok to close encounter?

## 2016-12-10 NOTE — Telephone Encounter (Signed)
Spoke with the patient, Breast Surgeon didn't feel she needed a biopsy, she is going to f/u with him in a month. She is so relieved

## 2016-12-15 ENCOUNTER — Telehealth: Payer: Self-pay | Admitting: Neurology
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
## 2016-12-17 DIAGNOSIS — H5203 Hypermetropia, bilateral: Secondary | ICD-10-CM | POA: Diagnosis not present

## 2016-12-17 DIAGNOSIS — H2513 Age-related nuclear cataract, bilateral: Secondary | ICD-10-CM | POA: Diagnosis not present

## 2016-12-17 DIAGNOSIS — H04123 Dry eye syndrome of bilateral lacrimal glands: Secondary | ICD-10-CM | POA: Diagnosis not present

## 2016-12-26 DIAGNOSIS — H00014 Hordeolum externum left upper eyelid: Secondary | ICD-10-CM | POA: Diagnosis not present

## 2017-01-05 ENCOUNTER — Telehealth: Payer: Self-pay | Admitting: Neurology

## 2017-01-05 DIAGNOSIS — N6453 Retraction of nipple: Secondary | ICD-10-CM | POA: Diagnosis not present

## 2017-01-06 DIAGNOSIS — F331 Major depressive disorder, recurrent, moderate: Secondary | ICD-10-CM | POA: Diagnosis not present

## 2017-01-08 ENCOUNTER — Ambulatory Visit (INDEPENDENT_AMBULATORY_CARE_PROVIDER_SITE_OTHER): Payer: Medicare Other | Admitting: Neurology

## 2017-01-08 ENCOUNTER — Encounter: Payer: Self-pay | Admitting: Neurology

## 2017-01-08 ENCOUNTER — Other Ambulatory Visit: Payer: Self-pay | Admitting: Neurology

## 2017-01-08 VITALS — BP 115/74 | HR 79 | Ht 66.0 in | Wt 190.0 lb

## 2017-01-08 DIAGNOSIS — G3184 Mild cognitive impairment, so stated: Secondary | ICD-10-CM | POA: Diagnosis not present

## 2017-01-08 DIAGNOSIS — Z9989 Dependence on other enabling machines and devices: Secondary | ICD-10-CM | POA: Diagnosis not present

## 2017-01-08 DIAGNOSIS — F5104 Psychophysiologic insomnia: Secondary | ICD-10-CM | POA: Diagnosis not present

## 2017-01-08 DIAGNOSIS — G478 Other sleep disorders: Secondary | ICD-10-CM | POA: Diagnosis not present

## 2017-01-08 DIAGNOSIS — G4733 Obstructive sleep apnea (adult) (pediatric): Secondary | ICD-10-CM

## 2017-01-08 DIAGNOSIS — Z789 Other specified health status: Secondary | ICD-10-CM | POA: Insufficient documentation

## 2017-01-08 NOTE — Patient Instructions (Addendum)

## 2017-01-08 NOTE — Addendum Note (Signed)
Addended by: Larey Seat on: 01/08/2017 02:29 PM   Modules accepted: Orders

## 2017-01-08 NOTE — Addendum Note (Signed)
Addended by: Larey Seat on: 01/08/2017 02:33 PM   Modules accepted: Orders

## 2017-01-08 NOTE — Progress Notes (Addendum)
[facility]                                                                                                                                                            SLEEP CLINIC NOTE                                                                                                                                                                    Provider:  Dr Brett Fairy, MD  Referring Provider: Gayland Curry, DO Primary Care Physician:  Gayland Curry, DO  WN:[id number] seen for compliance of CPAP machine.  History from 08 January 2017.  I have the pleasure of seeing [name] today, who needs a new CPAP machine.  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  She sleeps a lot better when she uses CPAP in spite of the mild degree of apnea.  Today's compliance shows 100% compliance with an average use at time of 8 hours and 10 minutes, a set pressure of 10 cmH2O pressure without expiratory pressure relief, and a residual AHI of only 2.3.  There is some air leaks noted but these have not led to an artificial inflation of apnea count.  I would like to order for the patient and out to titration capable CPAP today between 5 and 12 cmH2O pressure with an interface of her choice.  The patient is currently using a nasal pillow, but is open to change it for a dream wear mask. She continues to struggle with insomnia. She takes lorazepam, lunesta 3 mg generic , tramadol, doxepin, melatonin all at night. Cognitive behavior therapy.    [name] is seen here today on 07/07/2016 for a compliance revisit. She knows that she is a very loud snorer with occasional apneas when not using CPAP and wants to continue CPAP therapy but her machine is now 76 years old and is not working as reliably as it used to. She also travels frequently and normal in machines are  lighter in weight. She uses her machine 8 hours and 36 and minutes  daily with a compliance of 93% set pressure of 10 cm water without EPR and a residual AHI of 1.5. She only has minor air leaks. Her new machine should be used at the same pressure that she is currently using. I would like to prescribe a model that could also be used as an Environmental manager. She needs heated humidity. She is sleeping much better.  Recently her husband could not sleep with her in the same room when she had no CPAP available due to power outage.  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    01/08/2016 - scheduled revisit. 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 Over the last 10 years she has steadily gained weight, although she was able to reduce over 60 pounds by being in on the  [employer]. This required the intake of multiple supplements. She also tried a supplement that a friend had given her and responded with hypomania actually she developed mania and needed to be treated. She is on polypharmacy as pain medication, insomnia medication and medication for bipolar disorder have all been added over the last 5 years. She reports that the CPAP machine works and it is at this time for years old. As his machine has been covered by Medicare she cannot replace its until it is about 76 years of age. She does sleep better usually with CPAP. She recently went patient on [place] she only had one headache in a period of over 3 weeks. After returning to [facility] her headaches have resumed. She has a very disturbed sleep cycle since returning, and stated that this lorazepam and Lunesta she was still not able to find sleep in a reasonable time.Part of her medication as provided by Dr. Andreas Blower, and part by Dr.  Casimiro Needle.Tramadol has been prescribed by Dr. Oneida Alar, Doxepin was prescribed by Dr. Casimiro Needle- Dr. Oneida Alar adjusted the dose to address neck pain as well as insomnia. Melatonin is not a respiratory suppressant or addictive drug. My goal today is to see if the patient could take higher dose of Lorazepam and if he could make the [facility] unnecessary rather than vice versa. We were able today to review the CPAP compliance which is 90% and very good, his average user time of 8 hours and 21 minutes, CPAP is set at 10 cm water pressure and AHI is 1.8, there are no significant air leaks noted. The patient can continue at the current settings and with the current interface.   Dr. Ferdinand Lango note: HPI: [name] is a 76 y.o. female here as a follow up. history of generalized anxiety disorder, hypomania and insomnia. She started having migraines when she was 38. The migraine is in the forehead. Bilateral. Pounding, sensitive to sound and light. Her vision gets wavy before the headache. She saw an ENT at [facility] and diagnosed her with sinusitis and placed her on antibiotics. She is having 10 a month, increased in the last few months. Previously she only had one migraine a month. She still feels like her sinuses are involved with pressure and pain. She uses a cpap at night and that is affecting her sinuses  as well. Her face feels full, head feels full, pressure in the sinuses, can't breath through nostril, ears feel full. She has been traveling extensively within the [place] and overseas. Her ears have been popping for 2 weeks. She last traveled to [place], [place] earlier this month and came back because she was sick. She has nausea and vomiting. She is currently in the middle of her antibiotic regimen. Interval history: Migraines were getting more frequent and more painful. No headaches now for a week. Husband here with her. Also more pain in the neck. She has had a daily headache since April 22nd, daily migraines.  Combined with heavy pollen and allergies, She was non compliant with medication and went off the medication. She has had neck pain due to degenerative arthritis. She has had neck pain, migraines, insomnia, it has been a "mess" per husband. ENT put her back in sinus medication which has helped. A week ago she was placed back on her neck pain medication. Doing better. For a week her headaches have been better. Headaches have been in the frontal area. She has nausea with migraines. She takes lorazepam, lunesta, tramadol, doxepin, melatonin all at night. Discussed polypharmacy, she really needs to meet with primary care to manage her medications, this combination can cause respiratory failure and confusion, increased risk of falls and other significant sequelae. Advise not to take all at once and see physicians who prescribed all this at night to let them know she has multiple meds now so they are aware of everything she takes. Reviewed notes, labs and imaging from outside physicians, which showed: Patient has OSA on CPAP and follows with Dr Brett Fairy. She has a history of mania in response to medications.   Review of Systems: Patient complains of symptoms per HPI as well as the following symptoms: No CP, no SOB. Fatigue, light sensitivity, joint pain, back pain, aching muscles, muscle cramps, neck pain, neck stiffness, headaches, agitation, agitation, nervous, decr concentration. Pertinent negatives per HPI. All others negative.  Social History   Socioeconomic History  . Marital status: Married    Spouse name: [name]  . Number of children: 0  . Years of education: 40  . Highest education level: Not on file  Social Needs  . Financial resource strain: Not on file  . Food insecurity - worry: Not on file  . Food insecurity - inability: Not on file  . Transportation needs - medical: Not on file  . Transportation needs - non-medical: Not on file  Occupational History  . Not on file  Tobacco Use  . Smoking  status: Passive Smoke Exposure - Never Smoker  . Smokeless tobacco: Never Used  . 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 Alcohol use: Yes    Alcohol/week: 0.0 oz    Comment: 1 glass - occas.  . Drug use: No  . Sexual activity: Not Currently    Partners: Male    Birth control/protection: Post-menopausal  Other Topics Concern  . Not on file  Social History Narrative   Patient is married [name]).   Patient drinks 2-4 cups of coffee daily.   Patient is retired.   Patient has two years of college.   Patient is right-handed.                Family History  Problem Relation Age of Onset  . Breast cancer Mother   . Aneurysm Father   . Migraines Father   .  Breast cancer Maternal Aunt   . Breast cancer Maternal Aunt   . Breast cancer Maternal Aunt   . Breast cancer Maternal Aunt   . High blood pressure Brother   . Melanoma Brother   . Kidney cancer Brother        Lesion on kidney  . Heart disease Unknown        Grandmother    Past Medical History:  Diagnosis Date  . 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 Constipation   . Depression   . Fibromyalgia 8/98   Truslow  . 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 Impaired hearing    left ear, wears hearing aids  . Insomnia    circadian rhythm component  . Insomnia   . Migraine 10/88   Spillman  . Nuclear sclerosis   . OSA on CPAP 2/07   uses cpap setting of 10  . Osteoarthritis 2007   Deveschwar  . Pain last week   left under breast pain   . Plantar fasciitis   . Scarlet fever as child  . Thyroid disorder   . Vitreous degeneration of right eye     Past Surgical History:  Procedure Laterality Date  . BREAST BIOPSY Left 6/00   Hardcastle  . BREAST EXCISIONAL BIOPSY Left 1998  . DE QUERVAIN'S RELEASE Right 10/97   Sypher  . left breast biopsy    . ROOT CANAL  02/11/2012  . TONSILLECTOMY  age 66  . TONSILLECTOMY AND ADENOIDECTOMY  1952  . TOTAL KNEE  ARTHROPLASTY Left 7/06  . TOTAL KNEE ARTHROPLASTY Right 11/08/2012   Procedure: RIGHT TOTAL KNEE ARTHROPLASTY;  Surgeon: Gearlean Alf, MD;  Location: [facility];  Service: Orthopedics;  Laterality: Right;  . TUBAL LIGATION      Current Outpatient Medications  Medication Sig Dispense Refill  . aspirin EC 81 MG tablet Take 81 mg by mouth daily.    . Aspirin-Acetaminophen-Caffeine (EXCEDRIN MIGRAINE PO) Take by mouth daily.     Marland Kitchen azelastine (ASTELIN) 0.1 % nasal spray USE 1 SPRAY IN EACH NOSTRIL ONCE DAILY IN THE EVENING  3  . betamethasone valerate ointment (VALISONE) 0.1 % Apply a pea sized amount topically BID for 1-2 weeks as needed 15 g 0  . Calcium-Magnesium-Vitamin D (CALCIUM MAGNESIUM PO) Take by mouth daily.    Marland Kitchen doxepin (SINEQUAN) 100 MG capsule Take 1 capsule (100 mg total) by mouth at bedtime. 90 capsule 12  . Eszopiclone 3 MG TABS Take 1 tablet (3 mg total) by mouth at bedtime. Take immediately before bedtime 30 tablet 3  . fluticasone (FLONASE) 50 MCG/ACT nasal spray USE 1 SPRAY IN EACH NOSTRIL ONCE DAILY IN THE MORNING  3  . levothyroxine (SYNTHROID, LEVOTHROID) 200 MCG tablet Take 200 mcg by mouth daily before breakfast.    . LORazepam (ATIVAN) 1 MG tablet Take 1 mg by mouth. TAKES TWO TABLET PO QHS    . Melatonin 3 MG TABS Take 10 mg by mouth daily.     . montelukast (SINGULAIR) 10 MG tablet Take 10 mg by mouth every evening.  3  . Omega-3 Fatty Acids (FISH OIL) 1000 MG CAPS Take 1,000 mg by mouth daily.    . polyethylene glycol (MIRALAX / GLYCOLAX) packet Take 17 g by mouth daily.    . traMADol (ULTRAM) 50 MG tablet Take 1 tablet (50 mg total) by mouth 3 (three) times daily as needed. 90 tablet 3  . valACYclovir (VALTREX) 1000 MG tablet 1/2 a  tablet BID x 3 days prn 30 tablet 1   No current facility-administered medications for this visit.     Allergies as of 01/08/2017 - Review Complete 01/08/2017  Allergen Reaction Noted  . Penicillins Anaphylaxis 04/29/2011  . Codeine  Nausea Only 04/29/2011  . Provigil [modafinil]  03/24/2013  . Seroquel [quetiapine fumarate]  07/20/2014  . Xyrem [sodium oxybate]  06/18/2011  . Ciprofloxacin Rash 07/01/2012  . Monistat [miconazole] Rash 07/01/2012  . Terazol [terconazole] Rash 07/01/2012    Vitals: BP 115/74   Pulse 79   Ht 5\' 6"  (1.676 m)   Wt 190 lb (86.2 kg)   LMP 02/25/1995 (Approximate)   BMI 30.67 kg/m  Last Weight:  Wt Readings from Last 1 Encounters:  01/08/17 190 lb (86.2 kg)   Last Height:   Ht Readings from Last 1 Encounters:  01/08/17 5\' 6"  (1.676 m)    Exam: Gen: NAD, conversant, well nourised, well groomed  CV: RRR, no MRG. No Carotid Bruits. No peripheral edema, warm, nontender Eyes: Conjunctivae clear without exudates or hemorrhage Neuro:Detailed Neurologic Exam Speech: Speech is normal; fluent and spontaneous with normal comprehension. No dysarthria, not stuttering.  Cognition:The patient is oriented to person, place, and time; recent and remote memory intact; language fluent;  normal attention, concentration. Cranial Nerves: The pupils are equal, round, and reactive to light. The fundi are normal and spontaneous venous pulsations are present. Visual fields are full to finger confrontation. Extraocular movements are intact. Trigeminal sensation is intact and the muscles of mastication are normal. The face is symmetric. The palate elevates in the midline. Hearing intact. Voice is normal. Shoulder shrug is normal. The tongue has normal motion without fasciculations. Gait: Heel-toe and tandem gait are normal.  no involuntary movements noted. Normal muscle tone, except for the cervical paraspinal muscles and shoulder. Very tense ! Strength is V/V in the upper and lower limbs.      1) I will order a new CPAP machine in December  2018 with auto- titration capacity for this patient, set at 10 cm without EPR , same interface. DME -[employer] [employer], please work with Linzie Collin, RPSGT.      Larey Seat, MD   01-08-2017  [facility] [address] [place], [place] [address]  Phone [phone] Fax [phone]  A total of 20 minutes was spent face-to-face with this patient.  Over half this time was spent on counseling patient on the OSA treatment , her insomnia and therapeutic options available.

## 2017-01-22 ENCOUNTER — Ambulatory Visit: Payer: Medicare Other | Admitting: Sports Medicine

## 2017-01-22 ENCOUNTER — Telehealth: Payer: Self-pay | Admitting: Neurology

## 2017-01-22 NOTE — Telephone Encounter (Signed)
I called pt. She is on vacation and her cpap is broken. She has called [facility] but they are not helping her. She wants to know her cpap pressure. The order from 01/08/17 says it is supposed to be set at 4-10 cm H2O. I advised pt of this. The latest data from Maryfrances Bunnell is 13 weeks old so I am unable to see her cpap pressure was changed. I advised pt that I will ask [facility] to contact pt to help make sure her cpap is fixed and is set to the correct pressure. Pt verbalized understanding.

## 2017-01-22 NOTE — Telephone Encounter (Signed)
Pt has called back and is now asking if RN Myriam Jacobson can call her back with the pressure setting, should she be able to find someone to fix her CPAP.  Please call

## 2017-01-22 NOTE — Progress Notes (Deleted)
Office Visit Note  Patient: [name]             Date of Birth: Jul 14, 1940           MRN: [id number]             PCP: Gayland Curry, DO Referring: Gayland Curry, DO Visit Date: 02/03/2017 Occupation: @GUAROCC @    Subjective:  No chief complaint on file.   History of Present Illness: [name] is a 76 y.o. female ***   Activities of Daily Living:  Patient reports morning stiffness for *** {minute/hour:19697}.   Patient {ACTIONS;DENIES/REPORTS:21021675::"Denies"} nocturnal pain.  Difficulty dressing/grooming: {ACTIONS;DENIES/REPORTS:21021675::"Denies"} Difficulty climbing stairs: {ACTIONS;DENIES/REPORTS:21021675::"Denies"} Difficulty getting out of chair: {ACTIONS;DENIES/REPORTS:21021675::"Denies"} Difficulty using hands for taps, buttons, cutlery, and/or writing: {ACTIONS;DENIES/REPORTS:21021675::"Denies"}   No Rheumatology ROS completed.   PMFS History:  Patient Active Problem List   Diagnosis Date Noted  . 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 Intolerance of continuous positive airway pressure (CPAP) ventilation 01/08/2017  . History of total knee arthroplasty, 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  . Cough 03/06/2016  . Influenza due to identified novel influenza A virus with other respiratory manifestations 03/06/2016  . At risk for injury related to fall 02/14/2016  . Primary osteoarthritis of both hands 02/04/2016  . Osteopenia of multiple sites 02/04/2016  . Metatarsalgia of both feet 02/04/2016  . Migraines 02/04/2016  . Fibromyalgia 02/01/2016  . Other fatigue 02/01/2016  . Melena 08/20/2015  . Acute recurrent maxillary sinusitis 08/20/2015  . Slow transit constipation 08/20/2015  . Vitamin D deficiency 08/20/2015  . Thyroid activity decreased 08/20/2015  . Cephalalgia 08/20/2015  . Preop cardiovascular exam 07/20/2015  . Subacute ethmoidal sinusitis 12/11/2014  . Chronic infection of sinus 07/12/2014  . Nasal septal ulcer 05/25/2014    . 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 Deflected nasal septum 01/30/2014  . Hypertrophy of nasal turbinates 01/30/2014  . 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 Insomnia secondary to depression with anxiety 10/04/2013  . Chest pain 08/20/2013  . Exertional dyspnea 08/20/2013  . History of rheumatic fever 08/20/2013  . Generalized anxiety disorder 08/09/2013  . Hypomania (mild) single episode or unspecified 08/09/2013  . OSA on CPAP 04/06/2013  . Postoperative anemia due to acute blood loss 11/09/2012  . OA (osteoarthritis) of knee 11/08/2012  . History of giardia infection 07/15/2012  . Atrophic vaginitis 07/15/2012  . Vitreous degeneration of right eye   . Nuclear sclerosis   . Right shoulder pain 06/18/2011  . Foot pain, left 06/18/2011    Past Medical History:  Diagnosis Date  . 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 Constipation   . Depression   . Fibromyalgia 8/98   Truslow  . 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 Impaired hearing    left ear, wears hearing aids  . Insomnia    circadian rhythm component  . Insomnia   . Migraine 10/88   Spillman  . Nuclear sclerosis   . OSA on CPAP 2/07   uses cpap setting of 10  . Osteoarthritis 2007   Deveschwar  . Pain last week   left under breast pain   . Plantar fasciitis   . Scarlet fever as child  . Thyroid disorder   . Vitreous degeneration of right eye     Family History  Problem Relation Age of Onset  . Breast cancer Mother   . Aneurysm Father   . Migraines Father   . Breast  cancer Maternal Aunt   . Breast cancer Maternal Aunt   . Breast cancer Maternal Aunt   . Breast cancer Maternal Aunt   . High blood pressure Brother   . Melanoma Brother   . Kidney cancer Brother        Lesion on kidney  . Heart disease Unknown        Grandmother   Past Surgical History:  Procedure Laterality Date  . BREAST BIOPSY Left 6/00   Hardcastle  . BREAST EXCISIONAL BIOPSY Left  1998  . DE QUERVAIN'S RELEASE Right 10/97   Sypher  . left breast biopsy    . ROOT CANAL  02/11/2012  . TONSILLECTOMY  age 64  . TONSILLECTOMY AND ADENOIDECTOMY  1952  . 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 TOTAL KNEE ARTHROPLASTY Right 11/08/2012   Procedure: RIGHT TOTAL KNEE ARTHROPLASTY;  Surgeon: Gearlean Alf, MD;  Location: [facility];  Service: Orthopedics;  Laterality: Right;  . TUBAL LIGATION     Social History   Social History Narrative   Patient is married [name]).   Patient drinks 2-4 cups of coffee daily.   Patient is retired.   Patient has two years of college.   Patient is right-handed.                 Objective: Vital Signs: LMP 02/25/1995 (Approximate)    Physical Exam   Musculoskeletal Exam: ***  CDAI Exam: No CDAI exam completed.    Investigation: No additional findings.  CBC Latest Ref Rng & Units 05/15/2016 08/27/2015 11/10/2012  WBC 3.8 - 10.8 K/uL 6.9 4.0 11.5(H)  Hemoglobin 11.7 - 15.5 g/dL 14.9 14.1 11.5(L)  Hematocrit 35.0 - 45.0 % 44.8 42.1 34.2(L)  Platelets 140 - 400 K/uL 232 210 198   CMP Latest Ref Rng & Units 05/15/2016 05/07/2016 12/28/2015  Glucose 65 - 99 mg/dL 103(H) - -  BUN 7 - 25 mg/dL 25 - 19  Creatinine 0.60 - 0.93 mg/dL 0.74 - 0.7  Sodium 135 - 146 mmol/L 141 - 143  Potassium 3.5 - 5.3 mmol/L 4.2 - 4.4  Chloride 98 - 110 mmol/L 105 - -  CO2 20 - 31 mmol/L 27 - -  Calcium 8.6 - 10.4 mg/dL 9.3 - -  Total Protein 6.1 - 8.1 g/dL 6.6 - -  Total Bilirubin 0.2 - 1.2 mg/dL 0.5 - -  Alkaline Phos 33 - 130 U/L 57 72 -  AST 10 - 35 U/L 19 22 -  ALT 6 - 29 U/L 18 25 -   Imaging: No results found.  Speciality Comments: No specialty comments available.    Procedures:  No procedures performed Allergies: Penicillins; Codeine; Provigil [modafinil]; Seroquel [quetiapine fumarate]; Xyrem [sodium oxybate]; Ciprofloxacin; Monistat [miconazole]; and Terazol [terconazole]   Assessment / Plan:     Visit Diagnoses: No diagnosis found.      Orders: No orders of the defined types were placed in this encounter.  No orders of the defined types were placed in this encounter.   Face-to-face time spent with patient was *** minutes. 50% of time was spent in counseling and coordination of care.  Follow-Up Instructions: No Follow-up on file.   Earnestine Mealing, CMA  Note - This record has been created using Editor, commissioning.  Chart creation errors have been sought, but may not always  have been located. Such creation errors do not reflect on  the standard of medical care.

## 2017-01-22 NOTE — Telephone Encounter (Signed)
Pt calling to inform that her CPAP is broken, she is currently on vacation in [place], [place] with her spouse.  Pt states she tried calling [facility] and was told there is nothing they can do for her.  Pt is asking for a call to know if there is anything that can be done.  Please call

## 2017-01-27 ENCOUNTER — Encounter: Payer: Self-pay | Admitting: Sports Medicine

## 2017-01-27 ENCOUNTER — Ambulatory Visit (INDEPENDENT_AMBULATORY_CARE_PROVIDER_SITE_OTHER): Payer: Medicare Other | Admitting: Sports Medicine

## 2017-01-27 DIAGNOSIS — M47812 Spondylosis without myelopathy or radiculopathy, cervical region: Secondary | ICD-10-CM | POA: Diagnosis not present

## 2017-01-27 MED ORDER — PREDNISONE 20 MG PO TABS: 20.0000 mg | ORAL_TABLET | Freq: Two times a day (BID) | ORAL | 0 refills | Status: DC

## 2017-01-27 NOTE — Progress Notes (Signed)
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 This triggers migraines She had avoided these for months using tramsadol and Doxepin and migrines had become rare  Travled 6 hrs to [employer] husband play tennis tournament in uncomfortable stands Neck became painful Radiating to RT shoulder Tingling under finger nails of both hands  Tried some exercise but neck hurt worse  ROS No weakness in hands or arms No visual change but constant dull headache (less severe after rest)  PE [name] in NAD BP 124/80   Ht 5' 6.5" (1.689 m)   Wt 186 lb (84.4 kg)   LMP 02/25/1995 (Approximate)   BMI 29.57 kg/m   Neck ROM is limited in all but flexion More limited on RT rotation or lateral bend Spasm of RT trapezius Strength in upper extremities is preserved DTRs are diminished in both upper extremities

## 2017-01-27 NOTE — Assessment & Plan Note (Signed)
Flare of DDD symptoms Skip regular exercise class OK to do easy walks Will use prednisone 20 BID x 7 days  At end of this we hope doxepin and tramadol will be adequate  Reck 2 weeks

## 2017-01-29 ENCOUNTER — Ambulatory Visit: Payer: Medicare Other | Admitting: Sports Medicine

## 2017-02-02 ENCOUNTER — Ambulatory Visit: Payer: Medicare Other | Admitting: Internal Medicine

## 2017-02-02 ENCOUNTER — Ambulatory Visit (INDEPENDENT_AMBULATORY_CARE_PROVIDER_SITE_OTHER): Payer: Medicare Other | Admitting: Neurology

## 2017-02-02 DIAGNOSIS — G478 Other sleep disorders: Secondary | ICD-10-CM

## 2017-02-02 DIAGNOSIS — F5104 Psychophysiologic insomnia: Secondary | ICD-10-CM

## 2017-02-02 DIAGNOSIS — G4733 Obstructive sleep apnea (adult) (pediatric): Secondary | ICD-10-CM | POA: Diagnosis not present

## 2017-02-02 DIAGNOSIS — Z9989 Dependence on other enabling machines and devices: Secondary | ICD-10-CM

## 2017-02-03 ENCOUNTER — Ambulatory Visit: Payer: Medicare Other | Admitting: Rheumatology

## 2017-02-05 ENCOUNTER — Encounter (INDEPENDENT_AMBULATORY_CARE_PROVIDER_SITE_OTHER): Payer: Self-pay

## 2017-02-05 ENCOUNTER — Encounter: Payer: Self-pay | Admitting: Internal Medicine

## 2017-02-05 ENCOUNTER — Ambulatory Visit (INDEPENDENT_AMBULATORY_CARE_PROVIDER_SITE_OTHER): Payer: Medicare Other | Admitting: Internal Medicine

## 2017-02-05 VITALS — BP 130/70 | HR 79 | Temp 97.9°F | Wt 187.0 lb

## 2017-02-05 DIAGNOSIS — R682 Dry mouth, unspecified: Secondary | ICD-10-CM | POA: Insufficient documentation

## 2017-02-05 DIAGNOSIS — R51 Headache: Secondary | ICD-10-CM | POA: Diagnosis not present

## 2017-02-05 DIAGNOSIS — Z6829 Body mass index (BMI) 29.0-29.9, adult: Secondary | ICD-10-CM | POA: Insufficient documentation

## 2017-02-05 DIAGNOSIS — Z9989 Dependence on other enabling machines and devices: Secondary | ICD-10-CM

## 2017-02-05 DIAGNOSIS — R519 Headache, unspecified: Secondary | ICD-10-CM

## 2017-02-05 DIAGNOSIS — G8929 Other chronic pain: Secondary | ICD-10-CM

## 2017-02-05 DIAGNOSIS — G43009 Migraine without aura, not intractable, without status migrainosus: Secondary | ICD-10-CM

## 2017-02-05 DIAGNOSIS — T402X5A Adverse effect of other opioids, initial encounter: Secondary | ICD-10-CM

## 2017-02-05 DIAGNOSIS — K5903 Drug induced constipation: Secondary | ICD-10-CM

## 2017-02-05 DIAGNOSIS — G4733 Obstructive sleep apnea (adult) (pediatric): Secondary | ICD-10-CM

## 2017-02-05 DIAGNOSIS — E039 Hypothyroidism, unspecified: Secondary | ICD-10-CM | POA: Diagnosis not present

## 2017-02-05 DIAGNOSIS — E663 Overweight: Secondary | ICD-10-CM | POA: Insufficient documentation

## 2017-02-05 DIAGNOSIS — Z6828 Body mass index (BMI) 28.0-28.9, adult: Secondary | ICD-10-CM | POA: Insufficient documentation

## 2017-02-05 MED ORDER — NALDEMEDINE TOSYLATE 0.2 MG PO TABS: 1.0000 | ORAL_TABLET | Freq: Every day | ORAL | 3 refills | Status: DC

## 2017-02-05 NOTE — Progress Notes (Signed)
Location:  [facility] clinic Provider:  Davon Abdelaziz L. Mariea Clonts, D.O., C.M.D.  Code Status: full code Goals of Care:  Advanced Directives 09/25/2016  Does Patient Have a Medical Advance Directive? Yes  Type of Advance Directive [place] in Chart? Yes  Would patient like information on creating a medical advance directive? -  Pre-existing out of facility DNR order (yellow form or pink MOST form) -  Some encounter information is confidential and restricted. Go to Review Flowsheets activity to see all data.     Chief Complaint  Patient presents with  . Medical Management of Chronic Issues    58mths follow-up, testing for Sjogrens    HPI: Patient is a 76 y.o. female seen today for medical management of chronic diseases.    CPAP machine died when in [facility]  It was a very difficult time and her neck got out of alignment.  Head, neck and back were painful and she saw Dr. Oneida Alar.  He put her on prednisone and she is to only walk until it's under wraps.  Finished her last prednisone yesterday.  Now only lower back is hurting.  She has come off celebrex b/c she thought it was affecting her kidneys--she was only able to pass a little urine at a time.    She also saw her dentist.  Her mouth is broken out in sores.  She has had blotches on her face.  She wonders if she has sjogren's.  Eyes also stay dry.    Feels much better emotionally and physically.  Has been able to spend time with her husband due to weather.    Weight is down to 187 lbs from 194 lbs, even 202 lbs at one point.    Says even with three scoops per day of miralax, she's constipated due to tramadol which she needs for migraines from neck DDD.  Hydrates, eats prunes.    Past Medical History:  Diagnosis Date  . 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 Constipation   . Depression   . Fibromyalgia 8/98   Truslow  . 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 Impaired hearing     left ear, wears hearing aids  . Insomnia    circadian rhythm component  . Insomnia   . Migraine 10/88   Spillman  . Nuclear sclerosis   . OSA on CPAP 2/07   uses cpap setting of 10  . Osteoarthritis 2007   Deveschwar  . Pain last week   left under breast pain   . Plantar fasciitis   . Scarlet fever as child  . Thyroid disorder   . Vitreous degeneration of right eye     Past Surgical History:  Procedure Laterality Date  . BREAST BIOPSY Left 6/00   Hardcastle  . BREAST EXCISIONAL BIOPSY Left 1998  . DE QUERVAIN'S RELEASE Right 10/97   Sypher  . left breast biopsy    . ROOT CANAL  02/11/2012  . TONSILLECTOMY  age 64  . TONSILLECTOMY AND ADENOIDECTOMY  1952  . 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 TOTAL KNEE ARTHROPLASTY Right 11/08/2012   Procedure: RIGHT TOTAL KNEE ARTHROPLASTY;  Surgeon: Gearlean Alf, MD;  Location: [facility];  Service: Orthopedics;  Laterality: Right;  . TUBAL LIGATION      Allergies  Allergen Reactions  . Penicillins Anaphylaxis    Tongue swelling  . Codeine Nausea Only  . Provigil [Modafinil]   .  Seroquel [Quetiapine Fumarate]   . Xyrem [Sodium Oxybate]     hallucination  . Ciprofloxacin Rash  . Monistat [Miconazole] Rash  . Terazol [Terconazole] Rash    Outpatient Encounter Medications as of 02/05/2017  Medication Sig  . aspirin EC 81 MG tablet Take 81 mg by mouth daily.  . Aspirin-Acetaminophen-Caffeine (EXCEDRIN MIGRAINE PO) Take by mouth daily.   Marland Kitchen doxepin (SINEQUAN) 100 MG capsule Take 1 capsule (100 mg total) by mouth at bedtime.  . Eszopiclone 3 MG TABS Take 1 tablet (3 mg total) by mouth at bedtime. Take immediately before bedtime  . levothyroxine (SYNTHROID, LEVOTHROID) 200 MCG tablet Take 200 mcg by mouth daily before breakfast.  . LORazepam (ATIVAN) 1 MG tablet Take 1 mg by mouth. TAKES TWO TABLET PO QHS  . Melatonin 3 MG TABS Take 10 mg by mouth daily.   . montelukast (SINGULAIR) 10 MG tablet Take 10 mg by mouth every evening.   . polyethylene glycol (MIRALAX / GLYCOLAX) packet Take 17 g by mouth daily.  . traMADol (ULTRAM) 50 MG tablet Take 1 tablet (50 mg total) by mouth 3 (three) times daily as needed.  . valACYclovir (VALTREX) 1000 MG tablet 1/2 a tablet BID x 3 days prn  . [DISCONTINUED] azelastine (ASTELIN) 0.1 % nasal spray USE 1 SPRAY IN EACH NOSTRIL ONCE DAILY IN THE EVENING  . [DISCONTINUED] betamethasone valerate ointment (VALISONE) 0.1 % Apply a pea sized amount topically BID for 1-2 weeks as needed  . [DISCONTINUED] Calcium-Magnesium-Vitamin D (CALCIUM MAGNESIUM PO) Take by mouth daily.  . [DISCONTINUED] fluticasone (FLONASE) 50 MCG/ACT nasal spray USE 1 SPRAY IN EACH NOSTRIL ONCE DAILY IN THE MORNING  . [DISCONTINUED] Omega-3 Fatty Acids (FISH OIL) 1000 MG CAPS Take 1,000 mg by mouth daily.  . [DISCONTINUED] predniSONE (DELTASONE) 20 MG tablet Take 1 tablet (20 mg total) by mouth 2 (two) times daily.   No facility-administered encounter medications on file as of 02/05/2017.     Review of Systems:  Review of Systems  Constitutional: Positive for malaise/fatigue. Negative for chills and fever.  HENT: Positive for congestion.        Dry mouth with ulcerations  Eyes:       Watery dry eyes  Respiratory: Negative for cough and shortness of breath.   Cardiovascular: Negative for chest pain, palpitations and leg swelling.  Gastrointestinal: Positive for constipation. Negative for abdominal pain, blood in stool and melena.  Genitourinary: Negative for dysuria.  Musculoskeletal: Positive for myalgias and neck pain.       But better lately  Skin: Positive for rash.  Neurological: Negative for dizziness, loss of consciousness and weakness.  Psychiatric/Behavioral: Positive for depression and memory loss. The patient is nervous/anxious.        Reports memory loss--likely med side effects and sleep apnea    Health Maintenance  Topic Date Due  . MAMMOGRAM  04/11/2017  . COLONOSCOPY  09/11/2018  .  TETANUS/TDAP  10/09/2020  . DEXA SCAN  Completed  . PNA vac Low Risk Adult  Completed    Physical Exam: Vitals:   02/05/17 1151  BP: 130/70  Pulse: 79  Temp: 97.9 F (36.6 C)  TempSrc: Oral  SpO2: 96%  Weight: 187 lb (84.8 kg)   Body mass index is 29.73 kg/m. Physical Exam  Constitutional: She is oriented to person, place, and time. She appears well-developed and well-nourished. No distress.  HENT:  Head: Normocephalic and atraumatic.  Eyes:  Watery dry eyes  Cardiovascular: Normal rate, regular rhythm,  normal heart sounds and intact distal pulses.  Pulmonary/Chest: Effort normal and breath sounds normal. No respiratory distress.  Abdominal: Bowel sounds are normal.  Musculoskeletal: Normal range of motion. She exhibits tenderness.  Of neck muscles and trapezius, but better than prior visits  Neurological: She is alert and oriented to person, place, and time.  Skin: Skin is warm and dry.  Patchy erythema of face (since steroids), ulcerations in mouth, dry appearing, eyes watery  Psychiatric: She has a normal mood and affect.    Labs reviewed: Basic Metabolic Panel: Recent Labs    05/07/16 05/15/16 1510  NA  --  141  K  --  4.2  CL  --  105  CO2  --  27  GLUCOSE  --  103*  BUN  --  25  CREATININE  --  0.74  CALCIUM  --  9.3  TSH 0.01*  --    Liver Function Tests: Recent Labs    05/07/16 05/15/16 1510  AST 22 19  ALT 25 18  ALKPHOS 72 57  BILITOT  --  0.5  PROT  --  6.6  ALBUMIN  --  4.3   No results for input(s): LIPASE, AMYLASE in the last 8760 hours. No results for input(s): AMMONIA in the last 8760 hours. CBC: Recent Labs    05/15/16 1510  WBC 6.9  NEUTROABS 4,347  HGB 14.9  HCT 44.8  MCV 87.3  PLT 232   Lipid Panel: Recent Labs    05/07/16  CHOL 187  HDL 48  LDLCALC 119  TRIG 101   Lab Results  Component Value Date   HGBA1C 5.5 08/27/2015    Assessment/Plan 1. Dry mouth - c/o this more and requests testing for sjogren's--lab  orders placed, cont biotene, lozenges, hydration -explained huge role of several of her meds in this symptom plus winter weather - COMPLETE METABOLIC PANEL WITH GFR; Future - CBC with Differential/Platelet; Future - Sedimentation rate; Future - C-reactive protein; Future - Sjogren's syndrome antibods(ssa + ssb); Future  2. Chronic nonintractable headache, unspecified headache type -due to neck OA, cont mgt per Dr. Oneida Alar which has helped this tremendously  3. OSA on CPAP -needs new CPAP machine after old one broke, keep f/u for eval for this  4. Migraines -bad when cpap broken during travels--"borrowed" one is helping and this is better  5. Hypothyroidism, unspecified type -cont current levothyroxine - TSH; Future  6. Therapeutic opioid-induced constipation (OIC) - bothersome to her lately with tramadol use, lorazepam, doxepin, and maybe exzopiclone too - Naldemedine Tosylate (SYMPROIC) 0.2 MG TABS; Take 1 tablet by mouth daily.  Dispense: 30 tablet; Refill: 3  7. Overweight (BMI 25.0-29.9) - has been trying to be consistent with exercise, discussed getting back in the water for exercise - Lipid panel; Future  Labs/tests ordered:   Orders Placed This Encounter  Procedures  . COMPLETE METABOLIC PANEL WITH GFR    Standing Status:   Future    Standing Expiration Date:   05/06/2017  . CBC with Differential/Platelet    Standing Status:   Future    Standing Expiration Date:   05/06/2017  . Sedimentation rate    Standing Status:   Future    Standing Expiration Date:   05/06/2017  . C-reactive protein    Standing Status:   Future    Standing Expiration Date:   05/06/2017  . Sjogren's syndrome antibods(ssa + ssb)    Standing Status:   Future    Standing Expiration Date:  05/06/2017  . TSH    Standing Status:   Future    Standing Expiration Date:   05/06/2017  . Lipid panel    Standing Status:   Future    Standing Expiration Date:   05/06/2017   Next appt:  6 mos for med mgt,  come for labs when convenient next week   Anis Degidio L. Keagon Glascoe, D.O. [facility] [address], [place] [address] Cell Phone (Mon-Fri 8am-5pm):  [phone] On Call:  [phone] & follow prompts after 5pm & weekends Office Phone:  [phone] Office Fax:  [phone]

## 2017-02-09 ENCOUNTER — Telehealth: Payer: Self-pay | Admitting: *Deleted

## 2017-02-09 DIAGNOSIS — R5383 Other fatigue: Secondary | ICD-10-CM

## 2017-02-09 NOTE — Telephone Encounter (Signed)
Ok to add 25-hydroxy Vitamin D, B12 and B6 to her labs (if we can even check B6--never did before)--all for fatigue.  Hopefully her insurance will cover.  She did not mention this at her appt.

## 2017-02-09 NOTE — Telephone Encounter (Signed)
Patient called and wanted to know if you will add Vitamin B6, Vitamin B12 and Vitamin D to her lab order she is having done on Wednesday. Stated that she has no energy and would like her levels checked. Please Advise.

## 2017-02-10 NOTE — Telephone Encounter (Signed)
Labs added.

## 2017-02-11 ENCOUNTER — Other Ambulatory Visit: Payer: Medicare Other

## 2017-02-11 DIAGNOSIS — R5383 Other fatigue: Secondary | ICD-10-CM

## 2017-02-11 DIAGNOSIS — R682 Dry mouth, unspecified: Secondary | ICD-10-CM

## 2017-02-11 DIAGNOSIS — E663 Overweight: Secondary | ICD-10-CM | POA: Diagnosis not present

## 2017-02-11 DIAGNOSIS — E039 Hypothyroidism, unspecified: Secondary | ICD-10-CM

## 2017-02-12 ENCOUNTER — Telehealth: Payer: Self-pay | Admitting: Neurology

## 2017-02-12 ENCOUNTER — Encounter: Payer: Self-pay | Admitting: Sports Medicine

## 2017-02-12 ENCOUNTER — Ambulatory Visit (INDEPENDENT_AMBULATORY_CARE_PROVIDER_SITE_OTHER): Payer: Medicare Other | Admitting: Sports Medicine

## 2017-02-12 DIAGNOSIS — M859 Disorder of bone density and structure, unspecified: Secondary | ICD-10-CM | POA: Diagnosis not present

## 2017-02-12 DIAGNOSIS — M19041 Primary osteoarthritis, right hand: Secondary | ICD-10-CM

## 2017-02-12 DIAGNOSIS — M47812 Spondylosis without myelopathy or radiculopathy, cervical region: Secondary | ICD-10-CM | POA: Diagnosis not present

## 2017-02-12 DIAGNOSIS — M19042 Primary osteoarthritis, left hand: Secondary | ICD-10-CM

## 2017-02-12 LAB — HM DEXA SCAN

## 2017-02-12 NOTE — Telephone Encounter (Signed)
Will be reading it right now. CD

## 2017-02-12 NOTE — Assessment & Plan Note (Signed)
We will continue present meds This seems to keep her functional No change in dose Add some topical meds Add some heat in between ice

## 2017-02-12 NOTE — Telephone Encounter (Signed)
Pt calling she is wanting to know if home sleep study results are in and if a new machine has been ordered. Please call to advise

## 2017-02-12 NOTE — Patient Instructions (Signed)
Arthritis  You are having some worse symptoms since coming off celebrex Try to get by on the tramadol and doxepin If the pain is still present try: Aleve 2 or 3 per day for 3 days You can add a tylenol with this if it helps  Keep using some ice massage when needed  Heat often works best for low back  For the muscle spasm in your neck - try using arnica 3 times per day for a couple weeks to see if this might help  Keep seeing me on a 3 month cycle

## 2017-02-12 NOTE — Progress Notes (Signed)
CC: neck pain and migraine  Patient stable on tramadol and Doxepin These have blocked most of her migraine However, with activity - driving or riding car Too much time standing She gets trap spasm and this triggers headaches  Stopped Celebrex last month This has led to more aching in hand joints Some aching in knees (has TKRs)  ROS No weakness in hands or arms Mild swelling in MTP area  PE Pleasant older F in NAD BP 122/78   Ht 5' 6.5" (1.689 m)   Wt 186 lb (84.4 kg)   LMP 02/25/1995 (Approximate)   BMI 29.57 kg/m  Spasm of RT Trapezius Neck ROM is limited but at baseline Movement causes some pain at extremes No Weakness in C5 to T1  No effusions in knees Hands show puffiness MTP joints on RT only

## 2017-02-12 NOTE — Assessment & Plan Note (Signed)
May need to use periodic aleve/ tylenol to boost effect of tramadol

## 2017-02-12 NOTE — Telephone Encounter (Signed)
Called the patient and made her aware that her sleep study had not been read yet but that once it is read we will get her orders sent over. The patient has had a lot of difficulty with [facility] and getting thru to them and trying to get assistance since her older machine was broken. She is asking if there is anyone else to change her care to? I have informed her that I would make Dr Dohmeier aware of this and that once her sleep study is read I will send it to whichever company Dr Dohmeier recommends. Pt verbalized understanding.

## 2017-02-13 ENCOUNTER — Other Ambulatory Visit: Payer: Self-pay | Admitting: Neurology

## 2017-02-13 ENCOUNTER — Encounter: Payer: Self-pay | Admitting: *Deleted

## 2017-02-13 DIAGNOSIS — G4733 Obstructive sleep apnea (adult) (pediatric): Secondary | ICD-10-CM

## 2017-02-13 DIAGNOSIS — Z9989 Dependence on other enabling machines and devices: Principal | ICD-10-CM

## 2017-02-13 DIAGNOSIS — G478 Other sleep disorders: Secondary | ICD-10-CM

## 2017-02-13 NOTE — Procedures (Signed)
NAME:  [name]                                                      DOB: [birth year] MEDICAL RECORD NUMBER [id number]                                 DOS: 01/31/2017 REFERRING PHYSICIAN: Hollace Kinnier, D.O. STUDY PERFORMED: HST  HISTORY: Patient needs new CPAP machine. 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  She sleeps a lot better when she uses CPAP in spite of the mild degree of apnea.  Today's compliance shows 100% compliance with an average use at time of 8 hours and 10 minutes, a set pressure of 10 cmH2O pressure without expiratory pressure relief, and a residual AHI of only 2.3.  There is some air leaks noted but these have not led to an artificial inflation of apnea count.   The patient is currently using a nasal pillow, but is open to change it for a dream wear mask. She continues to struggle with insomnia. She takes lorazepam, Lunesta 3 mg, Tramadol, doxepin, melatonin all at night.   BMI: 30.6  STUDY RESULTS: Total Recording Time: 10 hours, 51 minutes,  Total Apnea/Hypopnea Index (AHI):   8.2/ hr.  Average Oxygen Saturation:    92 % Lowest Oxygen Desaturation:  82 %; Total Time Oxygen Saturation below 89%:  19 minutes Average Heart Rate:   64 bpm (40-161 bpm)  IMPRESSION: Mild sleep apnea with desaturation and heart rate variability. AHI 8.2 Continued CPAP therapy is indicated, please provide Auto CPAP 5-12 cm water , 3 cm EPR, heated humidity and fit for dream wear interface.   I certify that I have reviewed the raw data recording prior to the issuance of this report in accordance with the standards of the American Academy of Sleep Medicine (AASM). Larey Seat, MD    02-12-2017  Medical Director of [facility] at [facility], [place] of the ABPN and ABSM and accredited by AASM

## 2017-02-13 NOTE — Telephone Encounter (Signed)
Called the pt and made her aware that the sleep study was read and she still needs cpap therapy, she would like to change companies to [facility] and I will send those orders over now. I went over the CPAP compliance and I also gave her the number to [facility] so that she could personally reach out. The patient is scheduled for a CPAP f/u in march on the 19th at 2:15 pm with Cecille Rubin, NP. Pt verbalized understanding. Pt had no questions at this time but was encouraged to call back if questions arise.

## 2017-02-15 LAB — COMPLETE METABOLIC PANEL WITH GFR
AG Ratio: 1.8 (calc) (ref 1.0–2.5)
ALT: 15 U/L (ref 6–29)
AST: 15 U/L (ref 10–35)
Albumin: 3.7 g/dL (ref 3.6–5.1)
Alkaline phosphatase (APISO): 72 U/L (ref 33–130)
BUN/Creatinine Ratio: 18 (calc) (ref 6–22)
BUN: 10 mg/dL (ref 7–25)
CO2: 29 mmol/L (ref 20–32)
Calcium: 9.2 mg/dL (ref 8.6–10.4)
Chloride: 106 mmol/L (ref 98–110)
Creat: 0.56 mg/dL — ABNORMAL LOW (ref 0.60–0.93)
GFR, Est African American: 105 mL/min/{1.73_m2} (ref >=60)
GFR, Est Non African American: 91 mL/min/{1.73_m2} (ref >=60)
Globulin: 2.1 g/dL (calc) (ref 1.9–3.7)
Glucose, Bld: 104 mg/dL — ABNORMAL HIGH (ref 65–99)
Potassium: 4.3 mmol/L (ref 3.5–5.3)
Sodium: 142 mmol/L (ref 135–146)
Total Bilirubin: 0.5 mg/dL (ref 0.2–1.2)
Total Protein: 5.8 g/dL — ABNORMAL LOW (ref 6.1–8.1)

## 2017-02-15 LAB — VITAMIN D 25 HYDROXY (VIT D DEFICIENCY, FRACTURES): Vit D, 25-Hydroxy: 28 ng/mL — ABNORMAL LOW (ref 30–100)

## 2017-02-15 LAB — CBC WITH DIFFERENTIAL/PLATELET
Basophils Absolute: 32 cells/uL (ref 0–200)
Basophils Relative: 0.5 %
Eosinophils Absolute: 243 cells/uL (ref 15–500)
Eosinophils Relative: 3.8 %
HCT: 41 % (ref 35.0–45.0)
Hemoglobin: 13.5 g/dL (ref 11.7–15.5)
Lymphs Abs: 1440 cells/uL (ref 850–3900)
MCH: 28.5 pg (ref 27.0–33.0)
MCHC: 32.9 g/dL (ref 32.0–36.0)
MCV: 86.5 fL (ref 80.0–100.0)
MPV: 12 fL (ref 7.5–12.5)
Monocytes Relative: 11.8 %
Neutro Abs: 3930 cells/uL (ref 1500–7800)
Neutrophils Relative %: 61.4 %
Platelets: 222 10*3/uL (ref 140–400)
RBC: 4.74 10*6/uL (ref 3.80–5.10)
RDW: 12.3 % (ref 11.0–15.0)
Total Lymphocyte: 22.5 %
WBC mixed population: 755 cells/uL (ref 200–950)
WBC: 6.4 10*3/uL (ref 3.8–10.8)

## 2017-02-15 LAB — VITAMIN B12: Vitamin B-12: 628 pg/mL (ref 200–1100)

## 2017-02-15 LAB — SJOGREN'S SYNDROME ANTIBODS(SSA + SSB)
SSA (Ro) (ENA) Antibody, IgG: <1 AI
SSB (La) (ENA) Antibody, IgG: <1 AI

## 2017-02-15 LAB — TSH: TSH: 0.01 mIU/L — ABNORMAL LOW (ref 0.40–4.50)

## 2017-02-15 LAB — LIPID PANEL
Cholesterol: 160 mg/dL (ref <200)
HDL: 41 mg/dL — ABNORMAL LOW (ref >50)
LDL Cholesterol (Calc): 94 mg/dL (calc)
Non-HDL Cholesterol (Calc): 119 mg/dL (calc) (ref <130)
Total CHOL/HDL Ratio: 3.9 (calc) (ref <5.0)
Triglycerides: 149 mg/dL (ref <150)

## 2017-02-15 LAB — C-REACTIVE PROTEIN: CRP: 10.1 mg/L — ABNORMAL HIGH (ref <8.0)

## 2017-02-15 LAB — VITAMIN B6: Vitamin B6: 18.9 ng/mL (ref 2.1–21.7)

## 2017-02-15 LAB — SEDIMENTATION RATE: Sed Rate: 11 mm/h (ref 0–30)

## 2017-02-20 NOTE — Telephone Encounter (Signed)
Called the patient and she would like to proceed forward with going back to [facility] as they have contacted her to make her aware that her cpap is ready for pick up. [facility] was still processing everything and she was afraid she wouldn't get it before the end of the year.

## 2017-02-20 NOTE — Telephone Encounter (Signed)
Pt is asking if RN Myriam Jacobson can call her back to let her know if it is okay to get her CPAP from [facility].  Pt asked for a call before 10:00am but she has been made aware that RN Myriam Jacobson is with patients throughout the morning and that is not a guarantee.

## 2017-02-20 NOTE — Telephone Encounter (Signed)
Pt is wanting to know if she can get her sleep aponia machine from [facility] since they have it for her ready, or if she needs to wait for [facility]. Please call at [phone] or [phone]

## 2017-02-26 ENCOUNTER — Other Ambulatory Visit: Payer: Self-pay

## 2017-02-26 ENCOUNTER — Encounter: Payer: Self-pay | Admitting: Obstetrics and Gynecology

## 2017-02-26 ENCOUNTER — Ambulatory Visit (INDEPENDENT_AMBULATORY_CARE_PROVIDER_SITE_OTHER): Payer: Medicare Other | Admitting: Obstetrics and Gynecology

## 2017-02-26 VITALS — BP 122/60 | HR 84 | Resp 16 | Ht 66.0 in | Wt 192.0 lb

## 2017-02-26 DIAGNOSIS — R351 Nocturia: Secondary | ICD-10-CM | POA: Diagnosis not present

## 2017-02-26 DIAGNOSIS — Z01419 Encounter for gynecological examination (general) (routine) without abnormal findings: Secondary | ICD-10-CM

## 2017-02-26 DIAGNOSIS — N952 Postmenopausal atrophic vaginitis: Secondary | ICD-10-CM

## 2017-02-26 DIAGNOSIS — Z124 Encounter for screening for malignant neoplasm of cervix: Secondary | ICD-10-CM | POA: Diagnosis not present

## 2017-02-26 LAB — POCT URINALYSIS DIPSTICK
Appearance: NORMAL
Bilirubin, UA: NEGATIVE
Blood, UA: NEGATIVE
Glucose, UA: NEGATIVE
Ketones, UA: NEGATIVE
Nitrite, UA: NEGATIVE
Protein, UA: NEGATIVE
Urobilinogen, UA: NEGATIVE E.U./dL — AB
pH, UA: 5.5 (ref 5.0–8.0)

## 2017-02-26 MED ORDER — ESTRADIOL 10 MCG VA TABS: 1.0000 | ORAL_TABLET | VAGINAL | 3 refills | Status: DC

## 2017-02-26 NOTE — Progress Notes (Signed)
77 y.o. G0P0 MarriedCaucasianF here for annual exam.  She c/o vaginal dryness baseline. Not sexually active, husband would like to be.  She c/o nocturia x 1 at night.  She has issues with sleep, is on lunesta.  She has neck pain, was treated with steroids, on tramadol 3 x a day. Constipated, takes miralax and another OTC product.     Patient's last menstrual period was 02/25/1995 (approximate).          Sexually active: No.  The current method of family planning is post menopausal status.    Exercising: Yes.    water aerobics/ walking/ trainer / weights  Smoker:  no  Health Maintenance: Pap:  02-14-16 WNL NEG HR HPV  History of abnormal Pap:  Yes- years ago  MMG:  11-28-16 Recommendation is for surgical consultation for further evaluation of the asymmetric retraction and skin changes in the right nipple. No underlying imaging abnormalities are noted. She f/u with the surgeon, negative evaluation. Recommended she continue with her yearly mammograms.  Colonoscopy:  09-11-15 polyps, due next July  BMD:   01/2017 [facility] (waiting for results)  TDaP:  10-10-10 Gardasil: N/A   reports that she is a non-smoker but has been exposed to tobacco smoke. She has been exposed to tobacco smoke for the past 30.00 years. she has never used smokeless tobacco. She reports that she drinks alcohol. She reports that she does not use drugs. Drinks occasional ETOH.   Past Medical History:  Diagnosis Date  . 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 Constipation   . Depression   . Fibromyalgia 8/98   Truslow  . 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 Impaired hearing    left ear, wears hearing aids  . Insomnia    circadian rhythm component  . Insomnia   . Migraine 10/88   Spillman  . Nuclear sclerosis   . OSA on CPAP 2/07   uses cpap setting of 10  . Osteoarthritis 2007   Deveschwar  . Pain last week   left under breast pain   . Plantar fasciitis   . Scarlet fever as child  . Thyroid  disorder   . Vitreous degeneration of right eye     Past Surgical History:  Procedure Laterality Date  . BREAST BIOPSY Left 6/00   Hardcastle  . BREAST EXCISIONAL BIOPSY Left 1998  . DE QUERVAIN'S RELEASE Right 10/97   Sypher  . left breast biopsy    . ROOT CANAL  02/11/2012  . TONSILLECTOMY  age 28  . TONSILLECTOMY AND ADENOIDECTOMY  1952  . 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 TOTAL KNEE ARTHROPLASTY Right 11/08/2012   Procedure: RIGHT TOTAL KNEE ARTHROPLASTY;  Surgeon: Gearlean Alf, MD;  Location: [facility];  Service: Orthopedics;  Laterality: Right;  . TUBAL LIGATION      Current Outpatient Medications  Medication Sig Dispense Refill  . aspirin EC 81 MG tablet Take 81 mg by mouth daily.    . Aspirin-Acetaminophen-Caffeine (EXCEDRIN MIGRAINE PO) Take by mouth daily.     Marland Kitchen doxepin (SINEQUAN) 100 MG capsule Take 1 capsule (100 mg total) by mouth at bedtime. 90 capsule 12  . Eszopiclone 3 MG TABS Take 1 tablet (3 mg total) by mouth at bedtime. Take immediately before bedtime 30 tablet 3  . levothyroxine (SYNTHROID, LEVOTHROID) 200 MCG tablet Take 200 mcg by mouth daily before breakfast.    . LORazepam (ATIVAN) 1 MG tablet Take 1 mg by mouth. TAKES  TWO TABLET PO QHS    . Melatonin 3 MG TABS Take 10 mg by mouth daily.     . montelukast (SINGULAIR) 10 MG tablet Take 10 mg by mouth every evening.  3  . Naldemedine Tosylate (SYMPROIC) 0.2 MG TABS Take 1 tablet by mouth daily. (Patient not taking: Reported on 02/12/2017) 30 tablet 3  . polyethylene glycol (MIRALAX / GLYCOLAX) packet Take 17 g by mouth daily.    . traMADol (ULTRAM) 50 MG tablet Take 1 tablet (50 mg total) by mouth 3 (three) times daily as needed. 90 tablet 3  . valACYclovir (VALTREX) 1000 MG tablet 1/2 a tablet BID x 3 days prn 30 tablet 1   No current facility-administered medications for this visit.     Family History  Problem Relation Age of Onset  . Breast cancer Mother   . Aneurysm Father   . Migraines Father    . Breast cancer Maternal Aunt   . Breast cancer Maternal Aunt   . Breast cancer Maternal Aunt   . Breast cancer Maternal Aunt   . High blood pressure Brother   . Melanoma Brother   . Kidney cancer Brother        Lesion on kidney  . Heart disease Unknown        Grandmother    Review of Systems  Constitutional: Negative.   HENT: Positive for sinus pain.   Eyes: Negative.   Respiratory: Negative.   Cardiovascular: Negative.   Gastrointestinal: Negative.   Endocrine: Negative.   Genitourinary:       Night urination  Loss of urine with sneeze   Musculoskeletal: Positive for myalgias.  Skin: Negative.   Allergic/Immunologic: Negative.   Neurological: Positive for headaches.  Psychiatric/Behavioral: Negative.     Exam:   BP 122/60 (BP Location: Right Arm, Patient Position: Sitting, Cuff Size: Normal)   Pulse 84   Resp 16   Ht 5\' 6"  (1.676 m)   Wt 192 lb (87.1 kg)   LMP 02/25/1995 (Approximate)   BMI 30.99 kg/m   Weight change: @WEIGHTCHANGE @ Height:   Height: 5\' 6"  (167.6 cm)  Ht Readings from Last 3 Encounters:  02/26/17 5\' 6"  (1.676 m)  02/12/17 5' 6.5" (1.689 m)  01/27/17 5' 6.5" (1.689 m)    General appearance: alert, cooperative and appears stated age Head: Normocephalic, without obvious abnormality, atraumatic Neck: no adenopathy, supple, symmetrical, trachea midline and thyroid normal to inspection and palpation Lungs: clear to auscultation bilaterally Cardiovascular: regular rate and rhythm Breasts: normal appearance, no masses or tenderness, minimal inversion of the right nipple, no change Abdomen: soft, non-tender; non distended,  no masses,  no organomegaly Extremities: extremities normal, atraumatic, no cyanosis or edema Skin: Skin color, texture, turgor normal. No rashes or lesions Lymph nodes: Cervical, supraclavicular, and axillary nodes normal. No abnormal inguinal nodes palpated Neurologic: Grossly normal   Pelvic: External genitalia:  no  lesions              Urethra:  normal appearing urethra with no masses, tenderness or lesions              Bartholins and Skenes: normal                 Vagina: normal appearing atrophic vagina with normal color and discharge, no lesions              Cervix: no lesions               Bimanual Exam:  Uterus:  normal size, contour, position, consistency, mobility, non-tender              Adnexa: no mass, fullness, tenderness               Rectovaginal: Confirms               Anus:  normal sphincter tone, no lesions  Chaperone was present for exam.  Urine dip: trace leuk  A:  Well Woman with normal exam  Vaginal atrophy, discussed option of vaginal moisturizers or vaginal estrogen, desires estrogen.   P:   No pap this year  Mammogram UTD  Colonoscopy next summer  DEXA with endocrinology  Discussed breast self exam  Discussed calcium and vit D intake  Discussed very small potential risk of breast cancer with vaginal estrogen  Vagifem called in, call with any concerns  Did the Tyrer-Cuzick breast cancer risk assesment with her. Her risk of developing breast cancer to the age of 89 is 14.1%  In addition to her annual exam, approximately 10 minutes was spent face to face doing the breast cancer risk assesment.

## 2017-02-26 NOTE — Patient Instructions (Signed)

## 2017-02-27 DIAGNOSIS — H5310 Unspecified subjective visual disturbances: Secondary | ICD-10-CM | POA: Diagnosis not present

## 2017-02-27 IMAGING — CR DG CERVICAL SPINE COMPLETE 4+V
8 series · 8 of 8 positions shown · non-contrast
Comparison: None.

CLINICAL DATA: Neck pain increasing over the last 2 months, no
trauma

EXAM:
CERVICAL SPINE - COMPLETE 4+ VIEW

[w cervical spine lat]
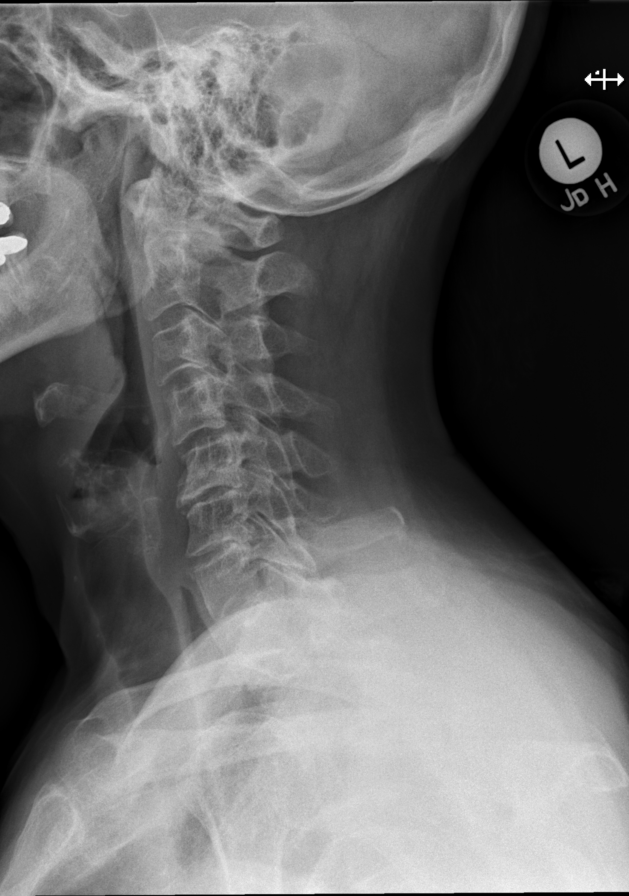

[w cervical spine ap_obl (1 of 3)]
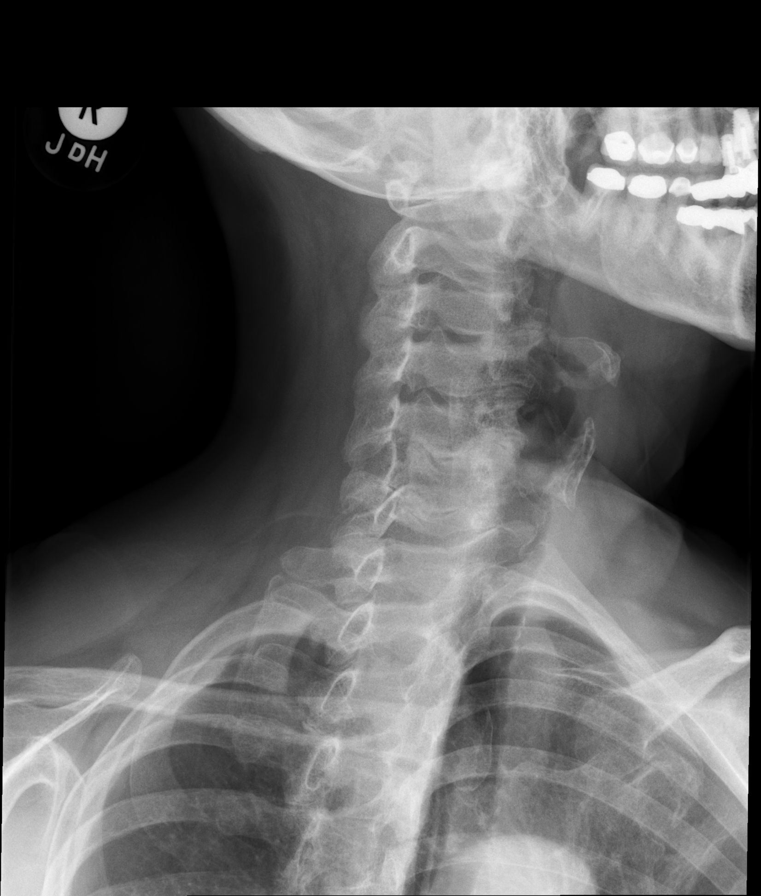

[w cervical spine ap_obl (2 of 3)]
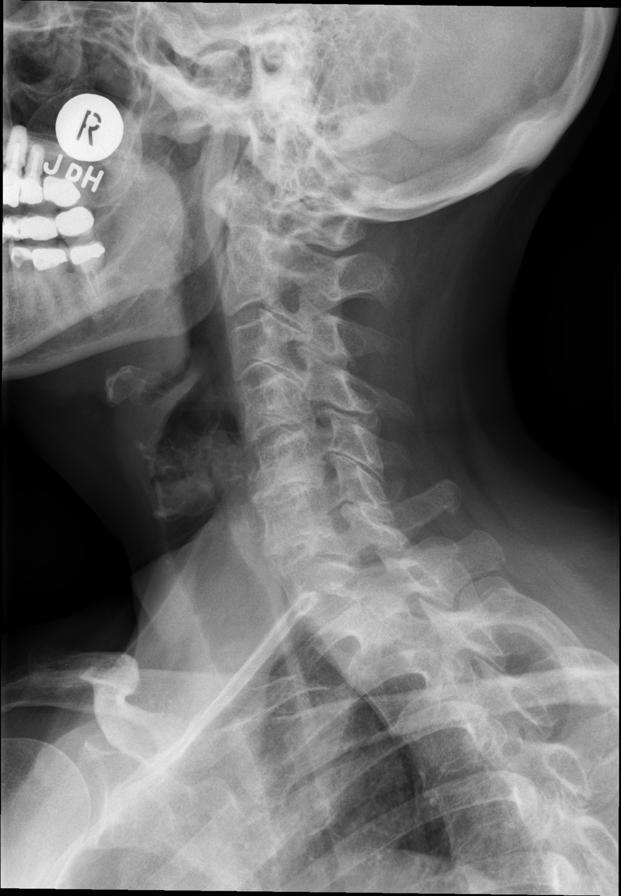

[w cervical spine ap_obl (3 of 3)]
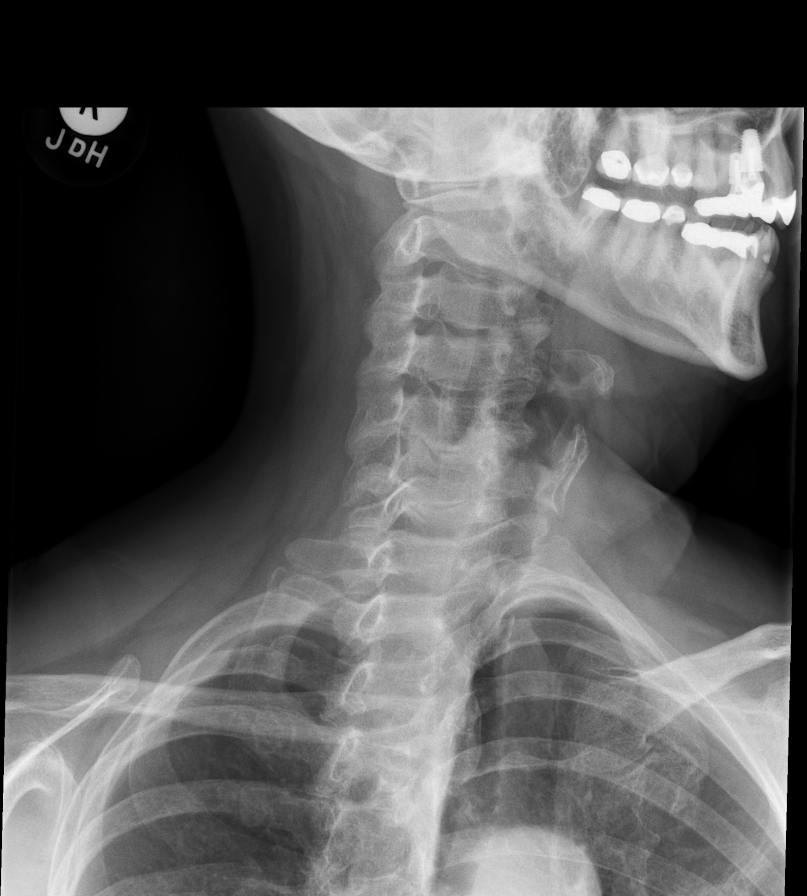

[w cervical spine ap]
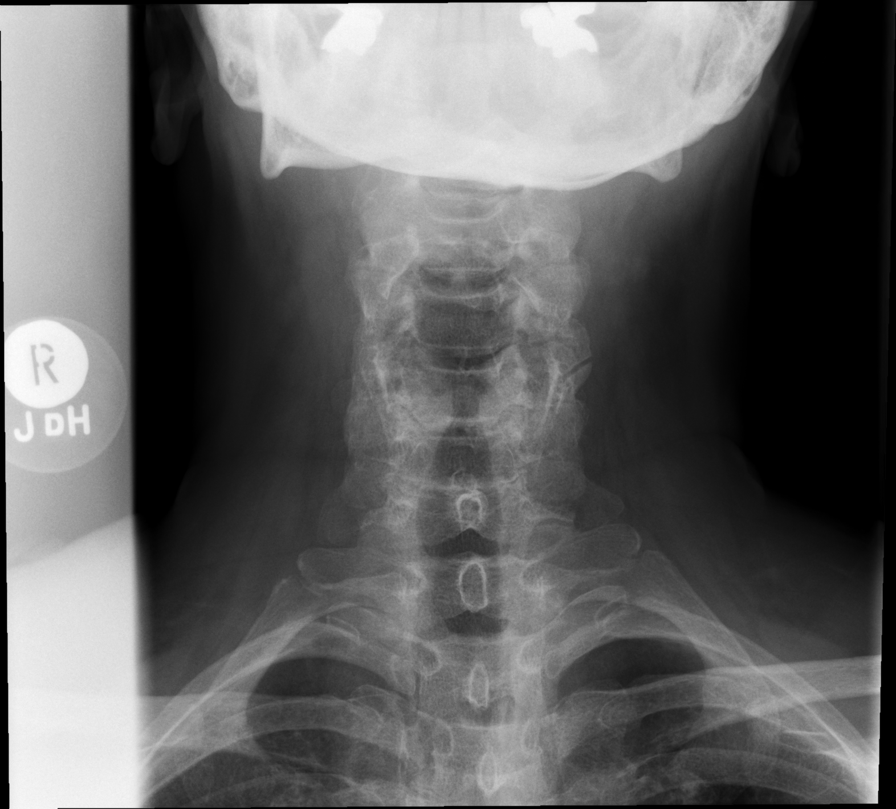

[w cervical swimmers]
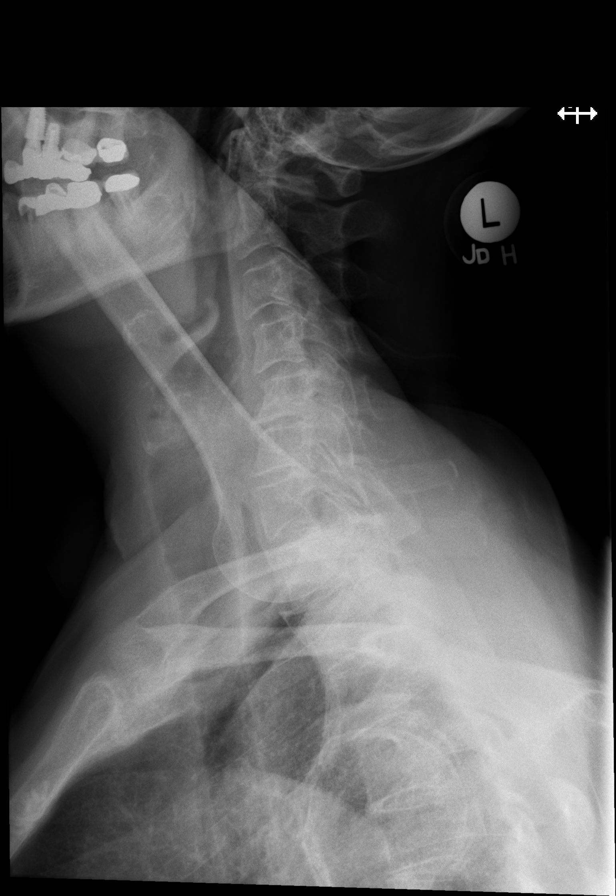

[t cervical spine odontoid (1 of 2)]
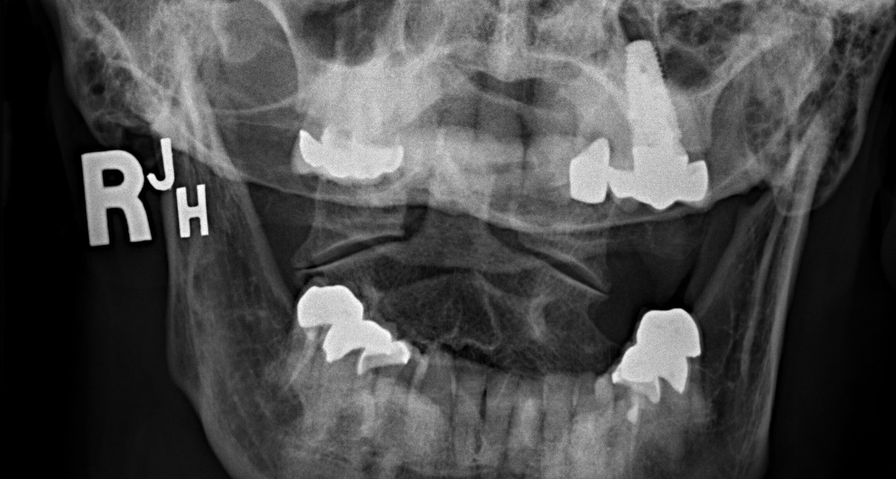

[t cervical spine odontoid (2 of 2)]
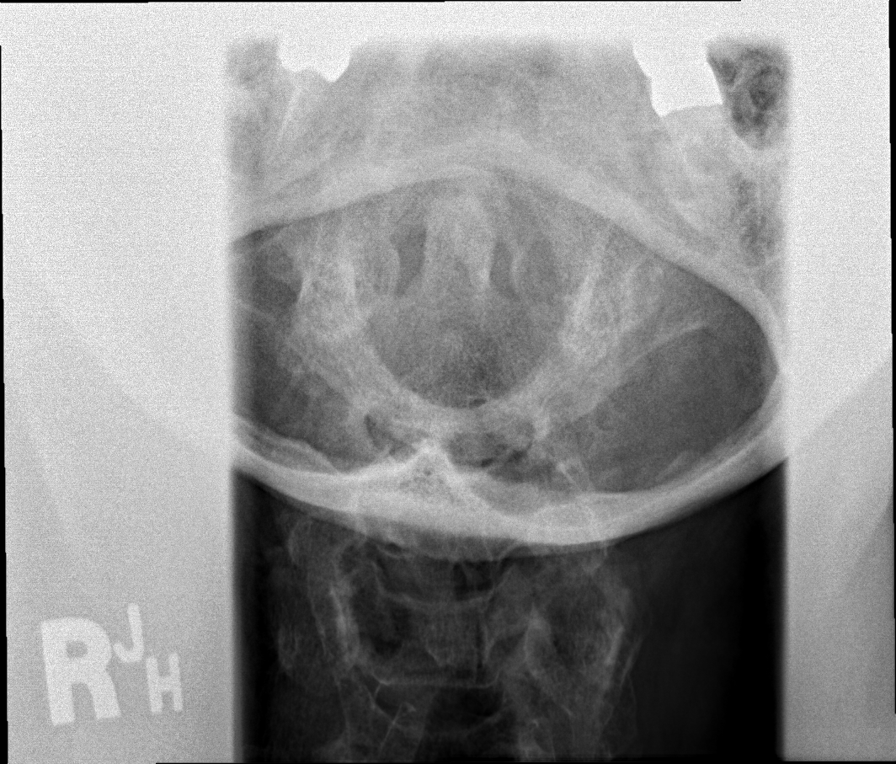

[8 of 8 positions shown; findings below may reference images not displayed]

FINDINGS: The cervical vertebrae are straightened in alignment. There is
degenerative disc disease at C5-6 and C6-7 with loss of disc space
and sclerosis with spurring. The remainder of intervertebral disc
spaces appear normal. No prevertebral soft tissue swelling is seen.
On oblique views, there is some foraminal narrowing at C5-6 and to a
lesser degree C6-7. The odontoid process is intact. The lung apices
appear clear.
IMPRESSION: Straightened alignment with degenerative disc disease at C5-6 and
C6-7, with some foraminal narrowing at these levels.

## 2017-03-11 ENCOUNTER — Telehealth: Payer: Self-pay

## 2017-03-11 ENCOUNTER — Telehealth: Payer: Self-pay | Admitting: Cardiovascular Disease

## 2017-03-11 NOTE — Telephone Encounter (Signed)
Patient called to request an appointment to be seen for chest discomfort. Patient stated that she was nauseous while out shopping yesterday and had a sudden sharp, chest pain in the center of her chest. Patient stated that she has not had any pain or discomfort today but wanted to be checked out by provider. Patient was informed that there were no appointments available today. Pt scheduled an appointment for tomorrow morning. Patient was advised to seek emergency care if chest pain occurred again. Pt verbalized understanding.

## 2017-03-11 NOTE — Telephone Encounter (Signed)
New message   Pt c/o of Chest Pain: STAT if CP now or developed within 24 hours  1. Are you having CP right now? No  2. Are you experiencing any other symptoms (ex. SOB, nausea, vomiting, sweating)? Nausea  3. How long have you been experiencing CP? Yesterday  4. Is your CP continuous or coming and going? Coming and going  5. Have you taken Nitroglycerin? No  Feeling pain between breast ?

## 2017-03-11 NOTE — Telephone Encounter (Signed)
Returned the call to the patient. She stated that while she was at the grocery store yesterday that she felt nauseous and had to sit and rest for a while. While at home resting she felt a sharp pain mid chest. She stated that she does not know how long the pain lasted but she has not had the pain since. She has been advised that if the pain comes back then she should go to the ED for further assessment.   The patient has an appointment on 1/21 with Jory Sims, DNP

## 2017-03-12 ENCOUNTER — Ambulatory Visit (INDEPENDENT_AMBULATORY_CARE_PROVIDER_SITE_OTHER): Payer: Medicare Other | Admitting: Nurse Practitioner

## 2017-03-12 ENCOUNTER — Encounter: Payer: Self-pay | Admitting: Nurse Practitioner

## 2017-03-12 VITALS — BP 118/68 | HR 73 | Temp 98.9°F | Ht 66.0 in | Wt 188.0 lb

## 2017-03-12 DIAGNOSIS — R079 Chest pain, unspecified: Secondary | ICD-10-CM | POA: Diagnosis not present

## 2017-03-12 NOTE — Progress Notes (Signed)
Careteam: [name], [name] Megan Salon, MD as Consulting Physician (Gynecology) Tiajuana Amass, MD as Referring Physician (Allergy and Immunology) Jari Pigg, MD as Consulting Physician (Dermatology) Luberta Mutter, MD as Consulting Physician (Ophthalmology) Plonk, Vivien Presto, MD as Referring Physician (Otolaryngology) Dohmeier, Asencion Partridge, MD as Consulting Physician (Neurology) Reynold Bowen, MD as Consulting Physician (Endocrinology)  Advanced Directive information    Allergies  Allergen Reactions  . Penicillins Anaphylaxis    Tongue swelling  . Codeine Nausea Only  . Provigil [Modafinil]   . Seroquel [Quetiapine Fumarate]   . Xyrem [Sodium Oxybate]     hallucination  . Ciprofloxacin Rash  . Monistat [Miconazole] Rash  . Terazol [Terconazole] Rash    Chief Complaint  Patient presents with  . Acute Visit    Nausea and chest discomfort. Patient went to [employer] 2 days ago, tried beef jerky, and later felt nauseated. Later in the day patient went home and experienced sharp pain in the center of chest that lasted x 1 min or less, symptoms resolved completely since then.  Patient also c/o of hot flashes (discussed with endo and other doctors in the past)   . Medication Management    Discuss supplements- question about fish oil   . Medication Refill    No refills needed      HPI: Patient is a 77 y.o. female seen in the office today due to nausea after eating beef jerky at [employer]..  Pt with hx of thyroid disease, OA, OSA, HSV, depression, insomnia, fibromyalgia.  30 minutes after eating a small sample of beef jerkyr she felt very nauseated. Sat in the the bathroom for 10 mins. No diarrhea or vomiting. symptoms resolved then went home and then went home and had a sharp stabbing pain that last < 1 mins. Has a hx of these pains in the past and has seen a cardiologist in the past without abnormal findings.  No  cardiac hx noted.  No hx of GERD Very fearful of dying. Reports she thinks about this frequently.  Does water aerobic, does this weekly and no chest pains or shortness of breath with that. No pains   Review of Systems:  Review of Systems  Constitutional: Negative for chills, fever and malaise/fatigue.  Eyes: Negative for blurred vision.  Respiratory: Negative for hemoptysis.   Cardiovascular: Positive for chest pain. Negative for palpitations and leg swelling.  Gastrointestinal: Positive for abdominal pain. Negative for constipation, diarrhea, heartburn and nausea.  Musculoskeletal:       Chronic pain due to fibromyalgia   Neurological: Negative for dizziness and headaches.    Past Medical History:  Diagnosis Date  . 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 Constipation   . Depression   . Fibromyalgia 8/98   Truslow  . 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 Impaired hearing    left ear, wears hearing aids  . Insomnia    circadian rhythm component  . Insomnia   . Migraine 10/88   Spillman  . Nuclear sclerosis   . OSA on CPAP 2/07   uses cpap setting of 10  . Osteoarthritis 2007   Deveschwar  . Pain last week   left under breast pain   . Plantar fasciitis   . Scarlet fever as child  . Thyroid disorder   . Vitreous degeneration of right eye    Past Surgical History:  Procedure Laterality Date  . BREAST BIOPSY Left  6/00   [place]  . BREAST EXCISIONAL BIOPSY Left 1998  . DE QUERVAIN'S RELEASE Right 10/97   Sypher  . left breast biopsy    . ROOT CANAL  02/11/2012  . TONSILLECTOMY  age 12  . TONSILLECTOMY AND ADENOIDECTOMY  1952  . 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 TOTAL KNEE ARTHROPLASTY Right 11/08/2012   Procedure: RIGHT TOTAL KNEE ARTHROPLASTY;  Surgeon: Gearlean Alf, MD;  Location: [facility];  Service: Orthopedics;  Laterality: Right;  . TUBAL LIGATION     Social History:   reports that she is a non-smoker but has been exposed to tobacco smoke.  She has been exposed to tobacco smoke for the past 30.00 years. she has never used smokeless tobacco. She reports that she drinks alcohol. She reports that she does not use drugs.  Family History  Problem Relation Age of Onset  . Breast cancer Mother   . Aneurysm Father   . Migraines Father   . Breast cancer Maternal Aunt   . Breast cancer Maternal Aunt   . Breast cancer Maternal Aunt   . Breast cancer Maternal Aunt   . High blood pressure Brother   . Melanoma Brother   . Kidney cancer Brother        Lesion on kidney  . Heart disease Unknown        Grandmother    Medications: Patient's Medications  New Prescriptions   No medications on file  Previous Medications   ASPIRIN EC 81 MG TABLET    Take 81 mg by mouth daily.   ASPIRIN-ACETAMINOPHEN-CAFFEINE (EXCEDRIN MIGRAINE PO)    Take by mouth daily.    DOXEPIN (SINEQUAN) 100 MG CAPSULE    Take 1 capsule (100 mg total) by mouth at bedtime.   ESTRADIOL 10 MCG TABS VAGINAL TABLET    Place 1 tablet (10 mcg total) vaginally 2 (two) times a week.   ESZOPICLONE 3 MG TABS    Take 1 tablet (3 mg total) by mouth at bedtime. Take immediately before bedtime   LEVOTHYROXINE (SYNTHROID, LEVOTHROID) 200 MCG TABLET    Take 200 mcg by mouth daily before breakfast.   LORAZEPAM (ATIVAN) 1 MG TABLET    Take 1 mg by mouth. TAKES TWO TABLET PO QHS   MELATONIN 3 MG TABS    Take 10 mg by mouth daily.    MONTELUKAST (SINGULAIR) 10 MG TABLET    Take 10 mg by mouth every evening.   POLYETHYLENE GLYCOL (MIRALAX / GLYCOLAX) PACKET    Take 17 g by mouth daily.   TRAMADOL (ULTRAM) 50 MG TABLET    Take 1 tablet (50 mg total) by mouth 3 (three) times daily as needed.   VALACYCLOVIR (VALTREX) 1000 MG TABLET    1/2 a tablet BID x 3 days prn  Modified Medications   No medications on file  Discontinued Medications   NALDEMEDINE TOSYLATE (SYMPROIC) 0.2 MG TABS    Take 1 tablet by mouth daily.     Physical Exam:  Vitals:   03/12/17 1026  BP: 118/68  Pulse: 73    Temp: 98.9 F (37.2 C)  TempSrc: Oral  SpO2: 93%  Weight: 188 lb (85.3 kg)  Height: 5\' 6"  (1.676 m)   Body mass index is 30.34 kg/m.  Physical Exam  Constitutional: She is oriented to person, place, and time. She appears well-developed and well-nourished. No distress.  HENT:  Head: Normocephalic and atraumatic.  Eyes:  Watery dry eyes  Cardiovascular: Normal rate, regular rhythm  and normal heart sounds.  Pulmonary/Chest: Effort normal and breath sounds normal. No respiratory distress.  Abdominal: Bowel sounds are normal.  Musculoskeletal: Normal range of motion.  Neurological: She is alert and oriented to person, place, and time.  Skin: Skin is warm and dry.  Patchy erythema of face (since steroids), ulcerations in mouth, dry appearing, eyes watery  Psychiatric: She has a normal mood and affect.    Labs reviewed: Basic Metabolic Panel: Recent Labs    05/07/16 05/15/16 1510 02/11/17 0846  NA  --  141 142  K  --  4.2 4.3  CL  --  105 106  CO2  --  27 29  GLUCOSE  --  103* 104*  BUN  --  25 10  CREATININE  --  0.74 0.56*  CALCIUM  --  9.3 9.2  TSH 0.01*  --  0.01*   Liver Function Tests: Recent Labs    05/07/16 05/15/16 1510 02/11/17 0846  AST 22 19 15   ALT 25 18 15   ALKPHOS 72 57  --   BILITOT  --  0.5 0.5  PROT  --  6.6 5.8*  ALBUMIN  --  4.3  --    No results for input(s): LIPASE, AMYLASE in the last 8760 hours. No results for input(s): AMMONIA in the last 8760 hours. CBC: Recent Labs    05/15/16 1510 02/11/17 0846  WBC 6.9 6.4  NEUTROABS 4,347 3,930  HGB 14.9 13.5  HCT 44.8 41.0  MCV 87.3 86.5  PLT 232 222   Lipid Panel: Recent Labs    05/07/16 02/11/17 0846  CHOL 187 160  HDL 48 41*  LDLCALC 119  --   TRIG 101 149  CHOLHDL  --  3.9   TSH: Recent Labs    05/07/16 02/11/17 0846  TSH 0.01* 0.01*   A1C: Lab Results  Component Value Date   HGBA1C 5.5 08/27/2015     Assessment/Plan 1. Chest pain, unspecified type - EKG  12-Lead- normal sinus rhythm noted rate 71, similar to old EKG. No acute changes notified. She request EKG to be faxed to cardiologist office for review as well so this has been done.  -pt with no known cardiac hx, no htn or hyperlipidemia.  -return precautions discussed and when to go to the ED   Giavonna Pflum K. Harle Battiest  [facility] [phone] 8 am - 5 pm) [phone] (after hours)

## 2017-03-12 NOTE — Patient Instructions (Signed)
EKG looks good. No changes from previous

## 2017-03-16 ENCOUNTER — Ambulatory Visit: Payer: Medicare Other | Admitting: Adult Health

## 2017-03-16 DIAGNOSIS — M542 Cervicalgia: Secondary | ICD-10-CM | POA: Diagnosis not present

## 2017-03-16 DIAGNOSIS — M47812 Spondylosis without myelopathy or radiculopathy, cervical region: Secondary | ICD-10-CM | POA: Diagnosis not present

## 2017-03-16 DIAGNOSIS — R51 Headache: Secondary | ICD-10-CM | POA: Diagnosis not present

## 2017-03-19 ENCOUNTER — Other Ambulatory Visit: Payer: Self-pay | Admitting: Sports Medicine

## 2017-03-19 ENCOUNTER — Other Ambulatory Visit: Payer: Self-pay | Admitting: *Deleted

## 2017-03-19 DIAGNOSIS — R51 Headache: Secondary | ICD-10-CM | POA: Diagnosis not present

## 2017-03-19 DIAGNOSIS — M47812 Spondylosis without myelopathy or radiculopathy, cervical region: Secondary | ICD-10-CM | POA: Diagnosis not present

## 2017-03-19 DIAGNOSIS — M542 Cervicalgia: Secondary | ICD-10-CM | POA: Diagnosis not present

## 2017-03-19 MED ORDER — TRAMADOL HCL 50 MG PO TABS: 50.0000 mg | ORAL_TABLET | Freq: Three times a day (TID) | ORAL | 1 refills | Status: DC | PRN

## 2017-03-24 DIAGNOSIS — R51 Headache: Secondary | ICD-10-CM | POA: Diagnosis not present

## 2017-03-24 DIAGNOSIS — M542 Cervicalgia: Secondary | ICD-10-CM | POA: Diagnosis not present

## 2017-03-24 DIAGNOSIS — M47812 Spondylosis without myelopathy or radiculopathy, cervical region: Secondary | ICD-10-CM | POA: Diagnosis not present

## 2017-03-26 DIAGNOSIS — M542 Cervicalgia: Secondary | ICD-10-CM | POA: Diagnosis not present

## 2017-03-26 DIAGNOSIS — R51 Headache: Secondary | ICD-10-CM | POA: Diagnosis not present

## 2017-03-26 DIAGNOSIS — M47812 Spondylosis without myelopathy or radiculopathy, cervical region: Secondary | ICD-10-CM | POA: Diagnosis not present

## 2017-03-30 ENCOUNTER — Other Ambulatory Visit: Payer: Self-pay | Admitting: Obstetrics and Gynecology

## 2017-03-30 DIAGNOSIS — Z1231 Encounter for screening mammogram for malignant neoplasm of breast: Secondary | ICD-10-CM

## 2017-03-31 DIAGNOSIS — M542 Cervicalgia: Secondary | ICD-10-CM | POA: Diagnosis not present

## 2017-03-31 DIAGNOSIS — M47812 Spondylosis without myelopathy or radiculopathy, cervical region: Secondary | ICD-10-CM | POA: Diagnosis not present

## 2017-03-31 DIAGNOSIS — R51 Headache: Secondary | ICD-10-CM | POA: Diagnosis not present

## 2017-04-01 ENCOUNTER — Telehealth: Payer: Self-pay | Admitting: Obstetrics and Gynecology

## 2017-04-01 NOTE — Telephone Encounter (Signed)
Patient came by the office requesting assistance with getting her prescription for Diflucan approved with her pharmacy. Taking documentation she provided back to the nurses to assist patient.

## 2017-04-02 DIAGNOSIS — M47812 Spondylosis without myelopathy or radiculopathy, cervical region: Secondary | ICD-10-CM | POA: Diagnosis not present

## 2017-04-02 DIAGNOSIS — R51 Headache: Secondary | ICD-10-CM | POA: Diagnosis not present

## 2017-04-02 DIAGNOSIS — M542 Cervicalgia: Secondary | ICD-10-CM | POA: Diagnosis not present

## 2017-04-02 NOTE — Telephone Encounter (Signed)
I think it is okay to call in diflucan for her to take with her on her trip [place]. I would let her know that every vaginitis panel she has had in the office in the last 2 years has been negative for yeast and she has been treated with a steroid ointment for local irritation. She should use vaseline as needed if she is feeling just slight irritation (or to prevent it). Please call in diflucan 150 mg, brand only, 1 tablet po x 1 as needed, may repeat x 1 in 72 hours. #2 no refills.

## 2017-04-02 NOTE — Telephone Encounter (Signed)
Patient requests [facility] be contacted about this. She says they have a current prescription on file for Diflucan but it is not brand name which is what she needs. Patient declined an appointment stating, "Dr. Sabra Heck or Debbi will know what I'm talking about."

## 2017-04-02 NOTE — Telephone Encounter (Signed)
Spoke with patient. Patient requesting Rx for Diflucan, Brand only RX. Patient has been notified by [facility] that this will require PA. Patient has had a PA completed in the past, expired 02/2017 per patient.  Patient states she is traveling to [place] next Friday and requesting RX for Diflucan "in case it is needed". Patient denies any current GYN symptoms.   Advised patient PA can not be submitted unless RX is submitted. Advised PA can take 72 business hours for response once submitted.   Patient expressed her frustration with the process and request this be reviewed with Dr. Talbert Nan and Melvia Heaps, CNM. Advised will review with providers and return call. Patient is agreeable.   Dr. Talbert Nan -please advise?  Cc: Melvia Heaps, CNM

## 2017-04-02 NOTE — Telephone Encounter (Signed)
Patient does not have a current RX for Diflucan. Left patient and message to call back regarding RX. Patient may need appointment for evaluation. -eh

## 2017-04-02 NOTE — Telephone Encounter (Signed)
Patient calling to speak with nurse about a medication.

## 2017-04-02 NOTE — Telephone Encounter (Signed)
She can just take Monistat OTC or could do Rx Terazol 7 due to history of yeast.

## 2017-04-02 NOTE — Telephone Encounter (Signed)
Patient is returning a call to Jill. °

## 2017-04-02 NOTE — Telephone Encounter (Signed)
Left message to call Davon Folta at [phone].  

## 2017-04-03 MED ORDER — FLUCONAZOLE 150 MG PO TABS: ORAL_TABLET | ORAL | 0 refills | Status: DC

## 2017-04-03 NOTE — Telephone Encounter (Signed)
Spoke with patient, advised as seen below per Dr. Talbert Nan. [facility] Advised patient will complete Exception Request provided to [facility] to [facility]. Patient verbalizes understanding and is agreeable.   Exception Request to Dr. Talbert Nan for signature.

## 2017-04-03 NOTE — Telephone Encounter (Signed)
Exception Request form faxed to [facility], will close encounter.

## 2017-04-06 ENCOUNTER — Telehealth: Payer: Self-pay | Admitting: *Deleted

## 2017-04-06 NOTE — Telephone Encounter (Signed)
PA for Diflucan approved through [facility] 04/03/17 - 04/03/2020.   Spoke with patient, advised as seen above. Patient verbalizes understanding and is agreeable.   Routing to provider for final review. Patient is agreeable to disposition. Will close encounter.

## 2017-04-08 DIAGNOSIS — L821 Other seborrheic keratosis: Secondary | ICD-10-CM | POA: Diagnosis not present

## 2017-04-08 DIAGNOSIS — L814 Other melanin hyperpigmentation: Secondary | ICD-10-CM | POA: Diagnosis not present

## 2017-04-08 DIAGNOSIS — D2271 Melanocytic nevi of right lower limb, including hip: Secondary | ICD-10-CM | POA: Diagnosis not present

## 2017-04-08 DIAGNOSIS — L719 Rosacea, unspecified: Secondary | ICD-10-CM | POA: Diagnosis not present

## 2017-04-08 DIAGNOSIS — D225 Melanocytic nevi of trunk: Secondary | ICD-10-CM | POA: Diagnosis not present

## 2017-04-08 DIAGNOSIS — Z808 Family history of malignant neoplasm of other organs or systems: Secondary | ICD-10-CM | POA: Diagnosis not present

## 2017-04-08 DIAGNOSIS — M542 Cervicalgia: Secondary | ICD-10-CM | POA: Diagnosis not present

## 2017-04-08 DIAGNOSIS — M47812 Spondylosis without myelopathy or radiculopathy, cervical region: Secondary | ICD-10-CM | POA: Diagnosis not present

## 2017-04-08 DIAGNOSIS — D1801 Hemangioma of skin and subcutaneous tissue: Secondary | ICD-10-CM | POA: Diagnosis not present

## 2017-04-08 DIAGNOSIS — R51 Headache: Secondary | ICD-10-CM | POA: Diagnosis not present

## 2017-04-08 DIAGNOSIS — D2261 Melanocytic nevi of right upper limb, including shoulder: Secondary | ICD-10-CM | POA: Diagnosis not present

## 2017-04-08 DIAGNOSIS — D1722 Benign lipomatous neoplasm of skin and subcutaneous tissue of left arm: Secondary | ICD-10-CM | POA: Diagnosis not present

## 2017-04-09 DIAGNOSIS — M542 Cervicalgia: Secondary | ICD-10-CM | POA: Diagnosis not present

## 2017-04-09 DIAGNOSIS — M47812 Spondylosis without myelopathy or radiculopathy, cervical region: Secondary | ICD-10-CM | POA: Diagnosis not present

## 2017-04-09 DIAGNOSIS — R51 Headache: Secondary | ICD-10-CM | POA: Diagnosis not present

## 2017-04-15 ENCOUNTER — Encounter: Payer: Self-pay | Admitting: Neurology

## 2017-04-15 ENCOUNTER — Ambulatory Visit (INDEPENDENT_AMBULATORY_CARE_PROVIDER_SITE_OTHER): Payer: Medicare Other | Admitting: Neurology

## 2017-04-15 VITALS — BP 111/73 | HR 82 | Ht 66.0 in | Wt 187.0 lb

## 2017-04-15 DIAGNOSIS — G478 Other sleep disorders: Secondary | ICD-10-CM

## 2017-04-15 DIAGNOSIS — Z9989 Dependence on other enabling machines and devices: Secondary | ICD-10-CM

## 2017-04-15 DIAGNOSIS — G4733 Obstructive sleep apnea (adult) (pediatric): Secondary | ICD-10-CM | POA: Diagnosis not present

## 2017-04-15 NOTE — Progress Notes (Signed)
SLEEP MEDICINE CLINIC   Provider:  Larey Seat, M D  Primary Care Physician:  Gayland Curry, DO   Referring Provider: Gayland Curry, DO   Chief Complaint  Patient presents with  . Follow-up    pt has gotten new machine     HPI:  [name] is a 77 y.o. female , seen here  For CPAP compliance   HISTORY:  04-15-2017, I have the pleasure of meeting with [name] today, for a regular CPAP CPAP compliance visit.  The patient now uses an AutoSet between 4 and 10 cmH2O pressure was 1 cm EPR, she has achieved a compliance of 100% with an average use at time of 9 hours and 10 minutes each night.  Her residual AHI is 1.5/h, which is a very good resolution.  95th percentile pressure is 9.7 cm water air leaks are medium. Epworth 2, FSS 41.  She loves the new CPAP auto- machine.  Here is the result of her sleep study:   NAME:  [name]                                                      DOB: 1940/07/02 MEDICAL RECORD NUMBER [id number]                                 DOS: 01/31/2017 REFERRING PHYSICIAN: Hollace Kinnier, D.O. BMI: 30.6  STUDY RESULTS: Total Recording Time: 10 hours, 51 minutes,  Total Apnea/Hypopnea Index (AHI):   8.2/ hr. RDI 10.5 /hr.  Average Oxygen Saturation:    92 % Lowest Oxygen Desaturation:  82 %; Total Time Oxygen Saturation below 89%:  19 minutes Average Heart Rate:   64 bpm (40-161 bpm).  IMPRESSION: Mild sleep apnea with snoring, desaturation and heart rate variability = AHI 8.2/hr .  Continued CPAP therapy is indicated.  I ordered  Auto CPAP 4-10 cm water, 3 cm EPR, heated humidity and fit for dream wear interface.    I certify that I have reviewed the raw data recording prior to the issuance of this report in accordance with the standards of the American Academy of Sleep Medicine (AASM).  Larey Seat, MD     Medical Director of [facility], and accredited by [employer] of the ABPN and ABSM   Epworth score  2 , Fatigue severity  score 41    Social History   Socioeconomic History  . Marital status: Married    Spouse name: [name]  . Number of children: 0  . Years of education: 98  . Highest education level: Not on file  Social Needs  . Financial resource strain: Not on file  . Food insecurity - worry: Not on file  . Food insecurity - inability: Not on file  . Transportation needs - medical: Not on file  . Transportation needs - non-medical: Not on file  Occupational History  . Not on file  Tobacco Use  . Smoking status: Passive Smoke Exposure - Never Smoker  . Smokeless tobacco: Never Used  . 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 Alcohol use: Yes    Alcohol/week: 0.0 oz    Comment: 1 glass - occas.  . Drug use:  No  . Sexual activity: Not Currently    Partners: Male    Birth control/protection: Post-menopausal  Other Topics Concern  . Not on file  Social History Narrative   Patient is married [name]).   Patient drinks 2-4 cups of coffee daily.   Patient is retired.   Patient has two years of college.   Patient is right-handed.                Family History  Problem Relation Age of Onset  . Breast cancer Mother   . Aneurysm Father   . Migraines Father   . Breast cancer Maternal Aunt   . Breast cancer Maternal Aunt   . Breast cancer Maternal Aunt   . Breast cancer Maternal Aunt   . High blood pressure Brother   . Melanoma Brother   . Kidney cancer Brother        Lesion on kidney  . Heart disease Unknown        Grandmother    Past Medical History:  Diagnosis Date  . 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 Constipation   . Depression   . Fibromyalgia 8/98   Truslow  . 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 Impaired hearing    left ear, wears hearing aids  . Insomnia    circadian rhythm component  . Insomnia   . Migraine 10/88   Spillman  . Nuclear sclerosis   . OSA on CPAP 2/07   uses cpap setting of 10  .  Osteoarthritis 2007   Deveschwar  . Pain last week   left under breast pain   . Plantar fasciitis   . Scarlet fever as child  . Thyroid disorder   . Vitreous degeneration of right eye     Past Surgical History:  Procedure Laterality Date  . BREAST BIOPSY Left 6/00   Hardcastle  . BREAST EXCISIONAL BIOPSY Left 1998  . DE QUERVAIN'S RELEASE Right 10/97   Sypher  . left breast biopsy    . ROOT CANAL  02/11/2012  . TONSILLECTOMY  age 11  . TONSILLECTOMY AND ADENOIDECTOMY  1952  . 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 TOTAL KNEE ARTHROPLASTY Right 11/08/2012   Procedure: RIGHT TOTAL KNEE ARTHROPLASTY;  Surgeon: Gearlean Alf, MD;  Location: [facility];  Service: Orthopedics;  Laterality: Right;  . TUBAL LIGATION      Current Outpatient Medications  Medication Sig Dispense Refill  . aspirin EC 81 MG tablet Take 81 mg by mouth daily.    . Aspirin-Acetaminophen-Caffeine (EXCEDRIN MIGRAINE PO) Take by mouth daily.     Marland Kitchen doxepin (SINEQUAN) 100 MG capsule Take 1 capsule (100 mg total) by mouth at bedtime. 90 capsule 12  . Eszopiclone 3 MG TABS Take 1 tablet (3 mg total) by mouth at bedtime. Take immediately before bedtime 30 tablet 3  . fluconazole (DIFLUCAN) 150 MG tablet Take one tablet po, may repeat in 72 hours if symptoms are still present. 2 tablet 0  . levothyroxine (SYNTHROID, LEVOTHROID) 200 MCG tablet Take 200 mcg by mouth daily before breakfast.    . LORazepam (ATIVAN) 1 MG tablet Take 1 mg by mouth. TAKES TWO TABLET PO QHS    . Melatonin 3 MG TABS Take 10 mg by mouth daily.     . montelukast (SINGULAIR) 10 MG tablet Take 10 mg by mouth every evening.  3  . polyethylene glycol (MIRALAX / GLYCOLAX) packet Take 17 g by mouth daily.    Marland Kitchen  traMADol (ULTRAM) 50 MG tablet Take 1 tablet (50 mg total) by mouth 3 (three) times daily as needed. 90 tablet 1  . valACYclovir (VALTREX) 1000 MG tablet 1/2 a tablet BID x 3 days prn 30 tablet 1   No current facility-administered medications for  this visit.     Allergies as of 04/15/2017 - Review Complete 04/15/2017  Allergen Reaction Noted  . Penicillins Anaphylaxis 04/29/2011  . Codeine Nausea Only 04/29/2011  . Provigil [modafinil]  03/24/2013  . Seroquel [quetiapine fumarate]  07/20/2014  . Xyrem [sodium oxybate]  06/18/2011  . Ciprofloxacin Rash 07/01/2012  . Monistat [miconazole] Rash 07/01/2012  . Terazol [terconazole] Rash 07/01/2012    Vitals: BP 111/73   Pulse 82   Ht 5\' 6"  (1.676 m)   Wt 187 lb (84.8 kg)   LMP 02/25/1995 (Approximate)   BMI 30.18 kg/m  Last Weight:  Wt Readings from Last 1 Encounters:  04/15/17 187 lb (84.8 kg)   ELF:YBOF mass index is 30.18 kg/m.     Last Height:   Ht Readings from Last 1 Encounters:  04/15/17 5\' 6"  (1.676 m)    Physical exam:  General: The patient is awake, alert and appears not in acute distress. The patient is well groomed. Head: Normocephalic, atraumatic. Neck is supple. Mallampati 4  neck circumference:15.5 . Nasal airflow patent.  Cardiovascular:  Regular rate and rhythm , without  murmurs or carotid bruit, and without distended neck veins. Respiratory: Lungs are clear to auscultation. Skin:  Without evidence of edema, or rash Trunk: BMI is  30.18- .   Neurologic exam : The patient is awake and alert, oriented to place and time.   Memory subjective described as intact.  Memory testing revealed   MOCA:No flowsheet data found. MMSE: MMSE - Mini Mental State Exam 09/23/2016 08/31/2015  Orientation to time 5 4  Orientation to Place 5 5  Registration 3 3  Attention/ Calculation 5 4  Recall 2 3  Language- name 2 objects 2 2  Language- repeat 1 1  Language- follow 3 step command 3 1  Language- read & follow direction 1 1  Write a sentence 1 1  Copy design 1 1  Total score 29 26       Attention span & concentration ability appears normal.  Speech is fluent, Mood and affect are appropriate.  Cranial nerves: Pupils are equal and briskly reactive to  light. Funduscopic exam without evidence of pallor or edema. Extraocular movements  in vertical and horizontal planes intact and without nystagmus. Visual fields by finger perimetry are intact. Hearing to finger rub intact.  Facial sensation intact to fine touch. Facial motor strength is symmetric and tongue and uvula move midline. Shoulder shrug was symmetrical. Motor exam:  Normal tone, muscle bulk and symmetric strength in all extremities. Sensory:  Fine touch, pinprick and vibration were tested in all extremities. Proprioception tested in the upper extremities was normal. Coordination: Rapid alternating movements in the fingers/hands was normal. Finger-to-nose maneuver  normal without evidence of ataxia, dysmetria or tremor. Gait and station: Patient walks without assistive device . Deep tendon reflexes: in the  upper and lower extremities are symmetric and intact. Babinski maneuver response is downgoing.    Assessment:  After physical and neurologic examination, review of laboratory studies,  Personal review of imaging studies, reports of other /same  Imaging studies, results of polysomnography and / or neurophysiology testing and pre-existing records as far as provided in visit., my assessment is   1) MILD  OSA- upper airway resistance:   The patient was advised of the nature of the diagnosed disorder , the treatment options and the  risks for general health and wellness arising from not treating the condition.  I spent more than 20 minutes of face to face time with the patient. Greater than 50% of time was spent in counseling and coordination of care. We have discussed the diagnosis and differential and I answered the patient's questions.    Plan:  Treatment plan and additional workup : continue using CPAP.     Larey Seat, MD 0/45/4098, 1:19 PM  Certified in Neurology by ABPN Certified in [place] by [facility] [address], [place], [place] [address]

## 2017-04-21 ENCOUNTER — Ambulatory Visit
Admission: RE | Admit: 2017-04-21 | Discharge: 2017-04-21 | Disposition: A | Discharge status: Home / Self Care | Payer: Medicare Other | Source: Ambulatory Visit | Attending: Obstetrics and Gynecology | Admitting: Obstetrics and Gynecology

## 2017-04-21 DIAGNOSIS — Z1231 Encounter for screening mammogram for malignant neoplasm of breast: Secondary | ICD-10-CM | POA: Diagnosis not present

## 2017-04-22 NOTE — Telephone Encounter (Signed)
Error

## 2017-05-12 ENCOUNTER — Ambulatory Visit: Payer: Self-pay | Admitting: Nurse Practitioner

## 2017-05-14 ENCOUNTER — Encounter: Payer: Self-pay | Admitting: Sports Medicine

## 2017-05-14 ENCOUNTER — Ambulatory Visit (INDEPENDENT_AMBULATORY_CARE_PROVIDER_SITE_OTHER): Payer: Medicare Other | Admitting: Sports Medicine

## 2017-05-14 DIAGNOSIS — M47812 Spondylosis without myelopathy or radiculopathy, cervical region: Secondary | ICD-10-CM

## 2017-05-14 DIAGNOSIS — M1811 Unilateral primary osteoarthritis of first carpometacarpal joint, right hand: Secondary | ICD-10-CM | POA: Diagnosis not present

## 2017-05-14 MED ORDER — TRAMADOL HCL 50 MG PO TABS: 50.0000 mg | ORAL_TABLET | Freq: Three times a day (TID) | ORAL | 1 refills | Status: DC | PRN

## 2017-05-14 MED ORDER — DICLOFENAC SODIUM 1 % TD GEL: 4.0000 g | Freq: Three times a day (TID) | TRANSDERMAL | 3 refills | Status: DC

## 2017-05-14 MED ORDER — DOXEPIN HCL 100 MG PO CAPS: 100.0000 mg | ORAL_CAPSULE | Freq: Every day | ORAL | 12 refills | Status: DC

## 2017-05-14 NOTE — Progress Notes (Signed)
HPI  CC: right wrist pain Patient is here with complaints of right-sided wrist pain.  She states that she first noticed this over the past few months.  Seems to be gradually worsening in time.  Pain is worse along the radial aspect of her wrist and is worse with all thumb movements and activities. No trauma, injury, or event which seemed to have started this pain.  She denies any significant swelling.  No erythema or ecchymosis.  No reduction in range of motion.  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  She is concerned that this may have something to do with her current pain.  Weakness, numbness, paresthesias, or radiation of pain up the arm.  Medications/Interventions Tried: None outside of her regular meds  See HPI and/or previous note for associated ROS. Neck sxs much improved Migraines now infrequent  Objective: BP 128/69   Ht 5\' 6"  (1.676 m)   Wt 188 lb (85.3 kg)   LMP 02/25/1995 (Approximate)   BMI 30.34 kg/m  Gen: NAD, well groomed, a/o x3, normal affect.  CV: Well-perfused. Warm.  Resp: Non-labored.  Neuro: Sensation intact throughout. No gross coordination deficits.  Gait: Nonpathologic posture, unremarkable stride without signs of limp or balance issues. Wrist, Right: TTP noted at the 1st [facility] joint. Inspection yielded no erythema, ecchymosis, bony deformity, or swelling. ROM full with good flexion and extension and ulnar/radial deviation that is symmetrical with opposite wrist. Palpation is normal over metacarpals, scaphoid, lunate, and TFCC; tendons without tenderness/swelling. Strength 5/5 in all directions without pain. Negative Finkelstein, tinel's and phalens.  ULTRASOUND: Right 1st CMC Joint  Quick, limited diagnostic ultrasound obtained of patient's Right 1st [facility] Joint.  -Ultrasound of the first [facility] joint yielded evidence of significant osteophyte development and joint space narrowing.  Increase fluid  accumulation also present. 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   Assessment and plan:  Arthritis of carpometacarpal (CMC) joint of right thumb Patient is here with signs and symptoms consistent with first [place] osteoarthritis.  Ultrasound was able to confirm this.  No injury that elicited this pain.  No red flag symptoms. -Prescription for Voltaren gel provided today. -Thumb spica brace provided today.  Patient encouraged to use this over the next few weeks for comfort and gradually wean out as her pain improves. -Follow-up 4 weeks.  Next: If pain persists consideration for ultrasound-guided injection would be warranted.  DJD (degenerative joint disease), cervical Patient states she has been doing well overall.  No setbacks. -Refilled medications.   Meds ordered this encounter  Medications  . traMADol (ULTRAM) 50 MG tablet    Sig: Take 1 tablet (50 mg total) by mouth 3 (three) times daily as needed.    Dispense:  90 tablet    Refill:  1  . doxepin (SINEQUAN) 100 MG capsule    Sig: Take 1 capsule (100 mg total) by mouth at bedtime.    Dispense:  90 capsule    Refill:  12  . diclofenac sodium (VOLTAREN) 1 % GEL    Sig: Apply 4 g topically 3 (three) times daily.    Dispense:  2 Tube    Refill:  [place], MD,MS [facility] Fellow 05/14/2017 5:49 PM   I observed and examined the patient with the [facility] Fellow and agree with assessment and plan.  Note reviewed and modified by me.  [name], MD

## 2017-05-14 NOTE — Assessment & Plan Note (Addendum)
Patient is here with signs and symptoms consistent with first [place] osteoarthritis.  Ultrasound was able to confirm this.  No injury that elicited this pain.  No red flag symptoms. -Prescription for Voltaren gel provided today. -Thumb spica brace provided today.  Patient encouraged to use this over the next few weeks for comfort and gradually wean out as her pain improves. -Follow-up 4 weeks.  Next: If pain persists consideration for ultrasound-guided injection would be warranted.

## 2017-05-14 NOTE — Assessment & Plan Note (Signed)
Patient states she has been doing well overall.  No setbacks. -Refilled medications.

## 2017-05-20 DIAGNOSIS — E559 Vitamin D deficiency, unspecified: Secondary | ICD-10-CM | POA: Diagnosis not present

## 2017-05-20 DIAGNOSIS — E7849 Other hyperlipidemia: Secondary | ICD-10-CM | POA: Diagnosis not present

## 2017-05-20 DIAGNOSIS — Z1389 Encounter for screening for other disorder: Secondary | ICD-10-CM | POA: Diagnosis not present

## 2017-05-20 DIAGNOSIS — M859 Disorder of bone density and structure, unspecified: Secondary | ICD-10-CM | POA: Diagnosis not present

## 2017-05-20 DIAGNOSIS — Z6830 Body mass index (BMI) 30.0-30.9, adult: Secondary | ICD-10-CM | POA: Diagnosis not present

## 2017-05-20 DIAGNOSIS — G4733 Obstructive sleep apnea (adult) (pediatric): Secondary | ICD-10-CM | POA: Diagnosis not present

## 2017-05-20 DIAGNOSIS — G43909 Migraine, unspecified, not intractable, without status migrainosus: Secondary | ICD-10-CM | POA: Diagnosis not present

## 2017-05-20 DIAGNOSIS — E038 Other specified hypothyroidism: Secondary | ICD-10-CM | POA: Diagnosis not present

## 2017-05-21 ENCOUNTER — Encounter: Payer: Self-pay | Admitting: *Deleted

## 2017-05-30 DIAGNOSIS — Z23 Encounter for immunization: Secondary | ICD-10-CM | POA: Diagnosis not present

## 2017-05-30 DIAGNOSIS — S65312A Laceration of deep palmar arch of left hand, initial encounter: Secondary | ICD-10-CM | POA: Diagnosis not present

## 2017-06-01 DIAGNOSIS — M1811 Unilateral primary osteoarthritis of first carpometacarpal joint, right hand: Secondary | ICD-10-CM | POA: Diagnosis not present

## 2017-06-01 DIAGNOSIS — S61412A Laceration without foreign body of left hand, initial encounter: Secondary | ICD-10-CM | POA: Diagnosis not present

## 2017-06-02 DIAGNOSIS — J300 Vasomotor rhinitis: Secondary | ICD-10-CM | POA: Diagnosis not present

## 2017-06-09 DIAGNOSIS — F331 Major depressive disorder, recurrent, moderate: Secondary | ICD-10-CM | POA: Diagnosis not present

## 2017-06-15 DIAGNOSIS — S61412A Laceration without foreign body of left hand, initial encounter: Secondary | ICD-10-CM | POA: Diagnosis not present

## 2017-06-15 DIAGNOSIS — M1811 Unilateral primary osteoarthritis of first carpometacarpal joint, right hand: Secondary | ICD-10-CM | POA: Diagnosis not present

## 2017-07-14 LAB — HM COLONOSCOPY

## 2017-07-24 ENCOUNTER — Telehealth: Payer: Self-pay

## 2017-07-24 MED ORDER — TRAMADOL HCL 50 MG PO TABS: 50.0000 mg | ORAL_TABLET | Freq: Three times a day (TID) | ORAL | 1 refills | Status: DC | PRN

## 2017-07-24 NOTE — Telephone Encounter (Signed)
Order placed

## 2017-08-06 ENCOUNTER — Telehealth: Payer: Self-pay | Admitting: Neurology

## 2017-08-06 NOTE — Telephone Encounter (Signed)
Pt fell last Thursday landing on her hip and hit the back of her head on a brick sidewalk in [place].  She said the HA's are worse than before.  Phone rep advised her to call PCP but she is said she is wanting to know what Dr D advises her to do. Please call to advise.

## 2017-08-06 NOTE — Telephone Encounter (Signed)
Pt called again after discussing with Dr Brett Fairy over text about what is going on. Dr Dohmeier has requested to have her seen. I offered a opening on Monday 6/17 at 2:30 pm. Pt verbalized understanding. Informed the patient to make her PCP aware and if she experiences any fainting spells or nausea to go to ED. Advised the pt of 2 pm check in for 2:30 pm apt.

## 2017-08-09 ENCOUNTER — Encounter: Payer: Self-pay | Admitting: Neurology

## 2017-08-10 ENCOUNTER — Ambulatory Visit (INDEPENDENT_AMBULATORY_CARE_PROVIDER_SITE_OTHER): Payer: Medicare Other | Admitting: Neurology

## 2017-08-10 ENCOUNTER — Telehealth: Payer: Self-pay | Admitting: Neurology

## 2017-08-10 ENCOUNTER — Encounter: Payer: Self-pay | Admitting: Neurology

## 2017-08-10 VITALS — BP 135/81 | HR 75 | Ht 66.5 in | Wt 190.0 lb

## 2017-08-10 DIAGNOSIS — G43019 Migraine without aura, intractable, without status migrainosus: Secondary | ICD-10-CM | POA: Diagnosis not present

## 2017-08-10 DIAGNOSIS — Z7189 Other specified counseling: Secondary | ICD-10-CM

## 2017-08-10 DIAGNOSIS — H43393 Other vitreous opacities, bilateral: Secondary | ICD-10-CM | POA: Diagnosis not present

## 2017-08-10 DIAGNOSIS — G43101 Migraine with aura, not intractable, with status migrainosus: Secondary | ICD-10-CM | POA: Diagnosis not present

## 2017-08-10 MED ORDER — VALPROATE SODIUM 500 MG/5ML IV SOLN: 500.0000 mg | Freq: Once | INTRAVENOUS | 0 refills | Status: AC | PRN

## 2017-08-10 NOTE — Progress Notes (Signed)
SLEEP MEDICINE CLINIC   Provider:  Larey Seat, M D  Primary Care Physician:  Gayland Curry, DO   Referring Provider: Gayland Curry, DO   Chief Complaint  Patient presents with  . Follow-up    pt alone, rm 10. pt here today she had a fall recently 07/30/17 and fell backwards hit left side of head as well as hip and both elbows. pt has been having headaches worsening. she has tried excedrin migraine and her normal medications.     HPI:  [name] is a 77 y.o. female , seen here for a new concern about a recent fall- retropusive, hit her head on a paved brick pavement, all this happened on June 6th at her husband's high school reunion in [place], [place].  Patient reports that she initially felt not as much pain, she has spit up by herself went to her bedroom and rested, but after 2 or 3 days she developed a headache associated with some sickness to her stomach and some photophobia but must be migrainous in origin.  She did not break any bones, her pupils are of similar size, and she does not have a focal weakness or sensory abnormality. She hasn't had a migraine for almost one year.  She is not dizzy- but walks with sunglasses.  She needs some intervention  for her migraine and a CT of the had to rule out any bleed.       In addition I would like to say that she has also used her CPAP compliantly, she has a residual AHI of 2.0 on an AutoSet between 4 and 10 cmH2O was 1 cm EPR, average use of time 7 hours 5 minutes nightly.  80% compliance on primary machine, has another ravel CPAP and the rest of the nights make up 205- together 100% compliance.     HISTORY: 04-15-2017, I have the pleasure of meeting with [name] today, for a regular CPAP CPAP compliance visit.  The patient now uses an AutoSet between 4 and 10 cmH2O pressure was 1 cm EPR, she has achieved a compliance of 100% with an average use at time of 9 hours and 10 minutes each night.  Her residual AHI is 1.5/h,  which is a very good resolution.  95th percentile pressure is 9.7 cm water air leaks are medium. Epworth 2, FSS 41.  She loves the new CPAP auto- machine.  Here is the result of her sleep study:   NAME:  [name]                                                      DOB: 01-Jul-1940 MEDICAL RECORD NUMBER [id number]                                 DOS: 01/31/2017 REFERRING PHYSICIAN: Hollace Kinnier, D.O. BMI: 30.6  STUDY RESULTS: Total Recording Time: 10 hours, 51 minutes,  Total Apnea/Hypopnea Index (AHI):   8.2/ hr. RDI 10.5 /hr.  Average Oxygen Saturation:    92 % Lowest Oxygen Desaturation:  82 %; Total Time Oxygen Saturation below 89%:  19 minutes Average Heart Rate:   64 bpm (40-161 bpm).  IMPRESSION: Mild sleep apnea with snoring, desaturation and heart rate variability = AHI  8.2/hr .  Continued CPAP therapy is indicated.  I ordered  Auto CPAP 4-10 cm water, 3 cm EPR, heated humidity and fit for dream wear interface.    I certify that I have reviewed the raw data recording prior to the issuance of this report in accordance with the standards of the American Academy of Sleep Medicine (AASM).  Larey Seat, MD     Medical Director of [facility], and accredited by [employer] of the ABPN and ABSM   Epworth score  2 , Fatigue severity score 41    Social History   Socioeconomic History  . Marital status: Married    Spouse name: [name]  . Number of children: 0  . Years of education: 49  . Highest education level: Not on file  Occupational History  . Not on file  Social Needs  . Financial resource strain: Not on file  . Food insecurity:    Worry: Not on file    Inability: Not on file  . Transportation needs:    Medical: Not on file    Non-medical: Not on file  Tobacco Use  . Smoking status: Passive Smoke Exposure - Never Smoker  . Smokeless tobacco: Never Used  . 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 Alcohol use:  Yes    Alcohol/week: 0.0 oz    Comment: 1 glass - occas.  . Drug use: No  . Sexual activity: Not Currently    Partners: Male    Birth control/protection: Post-menopausal  Lifestyle  . Physical activity:    Days per week: Not on file    Minutes per session: Not on file  . Stress: Not on file  Relationships  . Social connections:    Talks on phone: Not on file    Gets together: Not on file    Attends religious service: Not on file    Active member of club or organization: Not on file    Attends meetings of clubs or organizations: Not on file    Relationship status: Not on file  . Intimate partner violence:    Fear of current or ex partner: Not on file    Emotionally abused: Not on file    Physically abused: Not on file    Forced sexual activity: Not on file  Other Topics Concern  . Not on file  Social History Narrative   Patient is married [name]).   Patient drinks 2-4 cups of coffee daily.   Patient is retired.   Patient has two years of college.   Patient is right-handed.                Family History  Problem Relation Age of Onset  . Breast cancer Mother   . Aneurysm Father   . Migraines Father   . Breast cancer Maternal Aunt   . Breast cancer Maternal Aunt   . Breast cancer Maternal Aunt   . Breast cancer Maternal Aunt   . High blood pressure Brother   . Melanoma Brother   . Kidney cancer Brother        Lesion on kidney  . Heart disease Unknown        Grandmother    Past Medical History:  Diagnosis Date  . 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 Constipation   . Depression   . Fibromyalgia 8/98   [place]  . 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 Impaired  hearing    left ear, wears hearing aids  . Insomnia    circadian rhythm component  . Insomnia   . Migraine 10/88   Spillman  . Nuclear sclerosis   . OSA on CPAP 2/07   uses cpap setting of 10  . Osteoarthritis 2007   [place]war  . Pain last week   left under breast pain   .  Plantar fasciitis   . Scarlet fever as child  . Thyroid disorder   . Vitreous degeneration of right eye     Past Surgical History:  Procedure Laterality Date  . BREAST BIOPSY Left 6/00   [place]  . BREAST EXCISIONAL BIOPSY Left 1998  . DE QUERVAIN'S RELEASE Right 10/97   [place]  . left breast biopsy    . ROOT CANAL  02/11/2012  . TONSILLECTOMY  age 64  . TONSILLECTOMY AND ADENOIDECTOMY  1952  . 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 TOTAL KNEE ARTHROPLASTY Right 11/08/2012   Procedure: RIGHT TOTAL KNEE ARTHROPLASTY;  Surgeon: Gearlean Alf, MD;  Location: [facility];  Service: Orthopedics;  Laterality: Right;  . TUBAL LIGATION      Current Outpatient Medications  Medication Sig Dispense Refill  . aspirin EC 81 MG tablet Take 81 mg by mouth daily.    . Aspirin-Acetaminophen-Caffeine (EXCEDRIN MIGRAINE PO) Take by mouth daily.     Marland Kitchen azelastine (ASTELIN) 0.1 % nasal spray USE 1 SPRAY IN EACH NOSTRIL EVERY DAY  3  . diclofenac sodium (VOLTAREN) 1 % GEL Apply 4 g topically 3 (three) times daily. 2 Tube 3  . doxepin (SINEQUAN) 100 MG capsule Take 1 capsule (100 mg total) by mouth at bedtime. 90 capsule 12  . Eszopiclone 3 MG TABS Take 1 tablet (3 mg total) by mouth at bedtime. Take immediately before bedtime 30 tablet 3  . fluconazole (DIFLUCAN) 150 MG tablet Take one tablet po, may repeat in 72 hours if symptoms are still present. 2 tablet 0  . levothyroxine (SYNTHROID, LEVOTHROID) 200 MCG tablet Take 200 mcg by mouth daily before breakfast.    . LORazepam (ATIVAN) 1 MG tablet Take 1 mg by mouth. TAKES TWO TABLET PO QHS    . Melatonin 3 MG TABS Take 10 mg by mouth daily.     . montelukast (SINGULAIR) 10 MG tablet Take 10 mg by mouth every evening.  3  . polyethylene glycol (MIRALAX / GLYCOLAX) packet Take 17 g by mouth daily.    . traMADol (ULTRAM) 50 MG tablet Take 1 tablet (50 mg total) by mouth 3 (three) times daily as needed. 90 tablet 1  . valACYclovir (VALTREX) 1000 MG tablet 1/2  a tablet BID x 3 days prn 30 tablet 1  . CELEBREX 200 MG capsule Take by mouth daily.  0   No current facility-administered medications for this visit.     Allergies as of 08/10/2017 - Review Complete 08/10/2017  Allergen Reaction Noted  . Penicillins Anaphylaxis 04/29/2011  . Codeine Nausea Only 04/29/2011  . Provigil [modafinil]  03/24/2013  . Seroquel [quetiapine fumarate]  07/20/2014  . Xyrem [sodium oxybate]  06/18/2011  . Ciprofloxacin Rash 07/01/2012  . Monistat [miconazole] Rash 07/01/2012  . Terazol [terconazole] Rash 07/01/2012    Vitals: BP 135/81   Pulse 75   Ht 5' 6.5" (1.689 m)   Wt 190 lb (86.2 kg)   LMP 02/25/1995 (Approximate)   BMI 30.21 kg/m  Last Weight:  Wt Readings from Last 1 Encounters:  08/10/17 190 lb (86.2  kg)   DEY:CXKG mass index is 30.21 kg/m.     Last Height:   Ht Readings from Last 1 Encounters:  08/10/17 5' 6.5" (1.689 m)    Physical exam:  General: The patient is awake, alert and appears not in acute distress. The patient is well groomed. Head: Normocephalic, atraumatic. Neck is supple. Mallampati 4  neck circumference:15.5 . Nasal airflow patent.  Cardiovascular:  Regular rate and rhythm , without  murmurs or carotid bruit, and without distended neck veins. Respiratory: Lungs are clear to auscultation. Skin:  Without evidence of edema, or rash Trunk: BMI is  30.18- .   Neurologic exam : The patient is awake and alert, oriented to place and time.   Memory subjective described as intact.  Memory testing revealed   MOCA:No flowsheet data found. MMSE: MMSE - Mini Mental State Exam 09/23/2016 08/31/2015  Orientation to time 5 4  Orientation to Place 5 5  Registration 3 3  Attention/ Calculation 5 4  Recall 2 3  Language- name 2 objects 2 2  Language- repeat 1 1  Language- follow 3 step command 3 1  Language- read & follow direction 1 1  Write a sentence 1 1  Copy design 1 1  Total score 29 26       Attention span &  concentration ability appears normal.  Speech is fluent, Mood and affect are appropriate.  Cranial nerves: Pupils are equal and briskly reactive to light. Funduscopic exam without evidence of pallor or edema. Extraocular movements  in vertical and horizontal planes intact and without nystagmus. Visual fields by finger perimetry are intact. Hearing to finger rub intact.  Facial sensation intact to fine touch. Facial motor strength is symmetric and tongue and uvula move midline. Shoulder shrug was symmetrical. Motor exam:  Normal tone, muscle bulk and symmetric strength in all extremities. Sensory:  Fine touch, pinprick and vibration were tested in all extremities. Proprioception tested in the upper extremities was normal. Coordination: Rapid alternating movements in the fingers/hands was normal. Finger-to-nose maneuver  normal without evidence of ataxia, dysmetria or tremor. Gait and station: Patient walks without assistive device . Deep tendon reflexes: in the  upper and lower extremities are symmetric and intact. Babinski maneuver response is downgoing.    Assessment:  After physical and neurologic examination, review of laboratory studies,  Personal review of imaging studies, reports of other /same  Imaging studies, results of polysomnography and / or neurophysiology testing and pre-existing records as far as provided in visit., my assessment is   1) posttraumatic headaches, with visual aura, photophobia and nausea for 3 days.    2) MILD OSA- upper airway resistance: 100% CPAP compliance .   Plan: CT head , non contrast,           Depakote 1000 mg iv today.     Treatment plan and additional workup : continue using CPAP.     Larey Seat, MD 10/11/5629, 4:97 PM  Certified in Neurology by ABPN Certified in [place] by [facility] [address] [place],  [address]

## 2017-08-10 NOTE — Patient Instructions (Signed)
Concussion, Adult  A concussion is a brain injury from a direct hit (blow) to the head or body. This injury causes the brain to shake quickly back and forth inside the skull. It is caused by:   A hit to the head.   A quick and sudden movement (jolt) of the head or neck.    How fast you will get better from a concussion depends on many things like how bad your concussion was, what part of your brain was hurt, how old you are, and how healthy you were before the concussion. Recovery can take time. It is important to wait to return to activity until a doctor says it is safe and your symptoms are all gone.  Follow these instructions at home:  Activity   Limit activities that need a lot of thought or concentration. These include:  ? Homework or work for your job.  ? Watching TV.  ? Computer work.  ? Playing memory games and puzzles.   Rest. Rest helps the brain to heal. Make sure you:  ? Get plenty of sleep at night. Do not stay up late.  ? Go to bed at the same time every day.  ? Rest during the day. Take naps or rest breaks when you feel tired.   It can be dangerous if you get another concussion before the first one has healed Do not do activities that could cause a second concussion, such as riding a bike or playing sports.   Ask your doctor when you can return to your normal activities, like driving, riding a bike, or using machinery. Your ability to react may be slower. Do not do these activities if you are dizzy. Your doctor will likely give you a plan for slowly going back to activities.  General instructions   Take over-the-counter and prescription medicines only as told by your doctor.   Do not drink alcohol until your doctor says you can.   If it is harder than usual to remember things, write them down.   If you are easily distracted, try to do one thing at a time. For example, do not try to watch TV while making dinner.   Talk with family members or close friends when you need to make important  decisions.   Watch your symptoms and tell other people to do the same. Other problems (complications) can happen after a concussion. Older adults with a brain injury may have a higher risk of serious problems, such as a blood clot in the brain.   Tell your teachers, school nurse, school counselor, coach, athletic trainer, or work manager about your injury and symptoms. Tell them about what you can or cannot do. They should watch for:  ? More problems with attention or concentration.  ? More trouble remembering or learning new information.  ? More time needed to do tasks or assignments.  ? Being more annoyed (irritable) or having a harder time dealing with stress.  ? Any other symptoms that get worse.   Keep all follow-up visits as told by your health care provider. This is important.  Prevention   It is very important that you donot get another brain injury, especially before you have healed. In rare cases, another injury can cause permanent brain damage, brain swelling, or death. You have the most risk if you get another head injury in the first 7-10 days after you were hurt before. To avoid injuries:  ? Wear a seat belt when you ride in   a car.  ? Do not drink too much alcohol.  ? Avoid activities that could make you get a second concussion, like contact sports.  ? Wear a helmet when you do activities like:   Biking.   Skiing.   Skateboarding.   Skating.  ? Make your home safe by:   Removing things from the floor or stairs that could make you trip.   Using grab bars in bathrooms and handrails by stairs.   Placing non-slip mats on floors and in bathtubs.   Putting more light in dark areas.  Contact a doctor if:   Your symptoms get worse.   You have new symptoms.   You keep having symptoms for more than 2 weeks.  Get help right away if:   You have bad headaches, or your headaches get worse.   You have weakness in any part of your body.   You have loss of feeling (numbness).   You feel off  balance.   You keep throwing up (vomiting).   You feel more sleepy.   The black center of one eye (pupil) is bigger than the other one.   You twitch or shake violently (convulse) or have a seizure.   Your speech is not clear (is slurred).   You feel more tired, more confused, or more annoyed.   You do not recognize people or places.   You have neck pain.   It is hard to wake you up.   You have strange behavior changes.   You pass out (lose consciousness).  Summary   A concussion is a brain injury from a direct hit (blow) to the head or body.   This condition is treated with rest and careful watching of symptoms.   If you keep having symptoms for more than 2 weeks, call your doctor.  This information is not intended to replace advice given to you by your health care provider. Make sure you discuss any questions you have with your health care provider.  Document Released: 01/29/2009 Document Revised: 01/26/2016 Document Reviewed: 01/26/2016  Elsevier Interactive Patient Education  2017 [employer]

## 2017-08-10 NOTE — Telephone Encounter (Signed)
Medicare/aetna order sent to [facility]. They will obtain the auth for aetna and contact the pt to schedule.

## 2017-08-11 DIAGNOSIS — R635 Abnormal weight gain: Secondary | ICD-10-CM | POA: Diagnosis not present

## 2017-08-11 DIAGNOSIS — N951 Menopausal and female climacteric states: Secondary | ICD-10-CM | POA: Diagnosis not present

## 2017-08-12 DIAGNOSIS — R7309 Other abnormal glucose: Secondary | ICD-10-CM | POA: Diagnosis not present

## 2017-08-12 DIAGNOSIS — Z1339 Encounter for screening examination for other mental health and behavioral disorders: Secondary | ICD-10-CM | POA: Diagnosis not present

## 2017-08-12 DIAGNOSIS — Z6830 Body mass index (BMI) 30.0-30.9, adult: Secondary | ICD-10-CM | POA: Diagnosis not present

## 2017-08-12 DIAGNOSIS — N951 Menopausal and female climacteric states: Secondary | ICD-10-CM | POA: Diagnosis not present

## 2017-08-12 DIAGNOSIS — E039 Hypothyroidism, unspecified: Secondary | ICD-10-CM | POA: Diagnosis not present

## 2017-08-12 DIAGNOSIS — Z1331 Encounter for screening for depression: Secondary | ICD-10-CM | POA: Diagnosis not present

## 2017-08-13 DIAGNOSIS — Z713 Dietary counseling and surveillance: Secondary | ICD-10-CM | POA: Diagnosis not present

## 2017-08-13 DIAGNOSIS — R7309 Other abnormal glucose: Secondary | ICD-10-CM | POA: Diagnosis not present

## 2017-08-13 DIAGNOSIS — Z6830 Body mass index (BMI) 30.0-30.9, adult: Secondary | ICD-10-CM | POA: Diagnosis not present

## 2017-08-17 ENCOUNTER — Ambulatory Visit (INDEPENDENT_AMBULATORY_CARE_PROVIDER_SITE_OTHER): Payer: Medicare Other | Admitting: Internal Medicine

## 2017-08-17 ENCOUNTER — Ambulatory Visit
Admission: RE | Admit: 2017-08-17 | Discharge: 2017-08-17 | Disposition: A | Discharge status: Home / Self Care | Payer: Medicare Other | Source: Ambulatory Visit | Attending: Neurology | Admitting: Neurology

## 2017-08-17 ENCOUNTER — Encounter: Payer: Self-pay | Admitting: Internal Medicine

## 2017-08-17 VITALS — BP 128/70 | HR 60 | Temp 98.5°F | Ht 67.0 in | Wt 186.0 lb

## 2017-08-17 DIAGNOSIS — Z7189 Other specified counseling: Secondary | ICD-10-CM | POA: Diagnosis not present

## 2017-08-17 DIAGNOSIS — E663 Overweight: Secondary | ICD-10-CM

## 2017-08-17 DIAGNOSIS — E039 Hypothyroidism, unspecified: Secondary | ICD-10-CM | POA: Diagnosis not present

## 2017-08-17 DIAGNOSIS — M150 Primary generalized (osteo)arthritis: Secondary | ICD-10-CM

## 2017-08-17 DIAGNOSIS — Z9989 Dependence on other enabling machines and devices: Secondary | ICD-10-CM | POA: Diagnosis not present

## 2017-08-17 DIAGNOSIS — G43101 Migraine with aura, not intractable, with status migrainosus: Secondary | ICD-10-CM

## 2017-08-17 DIAGNOSIS — G4733 Obstructive sleep apnea (adult) (pediatric): Secondary | ICD-10-CM

## 2017-08-17 DIAGNOSIS — G43009 Migraine without aura, not intractable, without status migrainosus: Secondary | ICD-10-CM | POA: Diagnosis not present

## 2017-08-17 DIAGNOSIS — M8949 Other hypertrophic osteoarthropathy, multiple sites: Secondary | ICD-10-CM

## 2017-08-17 DIAGNOSIS — Z9181 History of falling: Secondary | ICD-10-CM

## 2017-08-17 DIAGNOSIS — Z6829 Body mass index (BMI) 29.0-29.9, adult: Secondary | ICD-10-CM

## 2017-08-17 DIAGNOSIS — M159 Polyosteoarthritis, unspecified: Secondary | ICD-10-CM

## 2017-08-17 DIAGNOSIS — H43393 Other vitreous opacities, bilateral: Secondary | ICD-10-CM

## 2017-08-17 MED ORDER — DICLOFENAC SODIUM 1 % TD GEL: 4.0000 g | Freq: Three times a day (TID) | TRANSDERMAL | 3 refills | Status: DC

## 2017-08-17 NOTE — Progress Notes (Signed)
Location:  [facility] clinic Provider:  Sreenidhi Ganson L. Mariea Clonts, D.O., C.M.D.  Goals of Care:  Advanced Directives 08/17/2017  Does Patient Have a Medical Advance Directive? Yes  Type of Advance Directive [place]  Does patient want to make changes to medical advance directive? No - Patient declined  Copy of [employer] [place] in Chart? Yes  Would patient like information on creating a medical advance directive? -  Pre-existing out of facility DNR order (yellow form or pink MOST form) -  Some encounter information is confidential and restricted. Go to Review Flowsheets activity to see all data.     Chief Complaint  Patient presents with  . Medical Management of Chronic Issues    31mth follow-up    HPI: Patient is a 77 y.o. female seen today for medical management of chronic diseases.    She's fallen twice she saw me.  4/6, she cut her hand open going up steps to her step-daughter's house in the rain in [place].  When she got to the top step, her left foot when right.  She had a bag with a glass in it and it cut her hand open--[facility] se[facility] it up.  6/6, they were at [place], they went up into the hotel door.  She could not push the button on the lock.  She fell backwards down 3 steps down to brick pavement. Left hip, scraped arms and hit the back of her head Goes today for testing due to headaches each morning upon awakening.  She saw Dr. Brett Fairy about this.  In the migraine clinic, she had something put in her arm and pushed saline through.  Within 10 mins her headache was almost gone.  I looked it up and she was given valproate.    She went to Dr. Oneida Alar and he said he'd give her a shot of cortisone if she was not better in a month.  She had blocked that out of her mind.    She went to the hand surgeon, Dr. Fredna Dow, and prescribed a brace.  When she takes it off, the pain comes back.    She is desperately trying to lose weight (8/2, they go to  [place] with the whole family).  She's trying to eat veggies, lean meat, avoiding starchy veggies and avoiding alcohol.  Skipping desserts.  She walked 2.6 miles yesterday.  Has not done water aerobics since that fall in [place] and the headaches.  Goes to Dr. Forde Dandy who orders her labs.  I don't have a copy of the March visit.  OSA on CPAP:   She had a repeat test and she got her new machine after her home study.  One apnea per hour over the 7 hrs she slept on new machine by her report.  It's much quieter.  It also sends reports directly to Dr. Brett Fairy.    Right thumb and knees ache.  Requests Rx for voltaren gel to take around to pharmacies and see how much it is to pay cash.    Past Medical History:  Diagnosis Date  . 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 Constipation   . Depression   . Fibromyalgia 8/98   [place]  . 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 Impaired hearing    left ear, wears hearing aids  . Insomnia    circadian rhythm component  . Insomnia   . Migraine 10/88   [place]  . Nuclear sclerosis   .  OSA on CPAP 2/07   uses cpap setting of 10  . Osteoarthritis 2007   [place]  . Pain last week   left under breast pain   . Plantar fasciitis   . Scarlet fever as child  . Thyroid disorder   . Vitreous degeneration of right eye     Past Surgical History:  Procedure Laterality Date  . BREAST BIOPSY Left 6/00   [place]  . BREAST EXCISIONAL BIOPSY Left 1998  . DE QUERVAIN'S RELEASE Right 10/97   [place]  . left breast biopsy    . ROOT CANAL  02/11/2012  . TONSILLECTOMY  age 41  . TONSILLECTOMY AND ADENOIDECTOMY  1952  . 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 TOTAL KNEE ARTHROPLASTY Right 11/08/2012   Procedure: RIGHT TOTAL KNEE ARTHROPLASTY;  Surgeon: Gearlean Alf, MD;  Location: [facility];  Service: Orthopedics;  Laterality: Right;  . TUBAL LIGATION      Allergies  Allergen Reactions  . Penicillins Anaphylaxis    Tongue swelling  . Codeine Nausea  Only  . Provigil [Modafinil]   . Seroquel [Quetiapine Fumarate]   . Xyrem [Sodium Oxybate]     hallucination  . Ciprofloxacin Rash  . Monistat [Miconazole] Rash  . Terazol [Terconazole] Rash    Outpatient Encounter Medications as of 08/17/2017  Medication Sig  . aspirin EC 81 MG tablet Take 81 mg by mouth daily.  . Aspirin-Acetaminophen-Caffeine (EXCEDRIN MIGRAINE PO) Take by mouth daily.   Marland Kitchen azelastine (ASTELIN) 0.1 % nasal spray USE 1 SPRAY IN EACH NOSTRIL EVERY DAY  . CELEBREX 200 MG capsule Take by mouth daily.  . Cholecalciferol (VITAMIN D3) 5000 units CAPS Take by mouth.  . diclofenac sodium (VOLTAREN) 1 % GEL Apply 4 g topically 3 (three) times daily.  Marland Kitchen doxepin (SINEQUAN) 100 MG capsule Take 1 capsule (100 mg total) by mouth at bedtime.  . Eszopiclone 3 MG TABS Take 1 tablet (3 mg total) by mouth at bedtime. Take immediately before bedtime  . fluconazole (DIFLUCAN) 150 MG tablet Take one tablet po, may repeat in 72 hours if symptoms are still present.  Marland Kitchen levothyroxine (SYNTHROID, LEVOTHROID) 200 MCG tablet Take 200 mcg by mouth daily before breakfast.  . LORazepam (ATIVAN) 1 MG tablet Take 1 mg by mouth 2 (two) times daily.   . Melatonin 3 MG TABS Take 10 mg by mouth daily.   . montelukast (SINGULAIR) 10 MG tablet Take 10 mg by mouth every evening.  . Multiple Vitamin (MULTIVITAMIN) capsule Take 1 capsule by mouth daily.  . Omega-3 Fatty Acids (FISH OIL) 1000 MG CAPS Take by mouth daily.  . polyethylene glycol (MIRALAX / GLYCOLAX) packet Take 17 g by mouth daily.  . traMADol (ULTRAM) 50 MG tablet Take 1 tablet (50 mg total) by mouth 3 (three) times daily as needed.  . valACYclovir (VALTREX) 1000 MG tablet Take 1,000 mg by mouth as needed.  . vitamin C (ASCORBIC ACID) 500 MG tablet Take 500 mg by mouth daily.  . [DISCONTINUED] valACYclovir (VALTREX) 1000 MG tablet 1/2 a tablet BID x 3 days prn   No facility-administered encounter medications on file as of 08/17/2017.      Review of Systems:  Review of Systems  Constitutional: Positive for malaise/fatigue. Negative for chills and fever.  HENT: Negative for congestion.   Eyes: Negative for blurred vision.  Respiratory: Negative for cough and shortness of breath.   Cardiovascular: Negative for chest pain.  Gastrointestinal: Negative for abdominal pain, blood in stool, constipation  and melena.  Genitourinary: Negative for dysuria.  Musculoskeletal: Positive for joint pain, myalgias and neck pain. Negative for falls.  Skin: Negative for itching and rash.  Neurological: Positive for headaches.  Psychiatric/Behavioral: Negative for depression and memory loss. The patient is nervous/anxious and has insomnia.     Health Maintenance  Topic Date Due  . MAMMOGRAM  04/21/2018  . COLONOSCOPY  09/11/2018  . TETANUS/TDAP  10/09/2020  . DEXA SCAN  Completed  . PNA vac Low Risk Adult  Completed    Physical Exam: Vitals:   08/17/17 0842  BP: 128/70  Pulse: 60  Temp: 98.5 F (36.9 C)  TempSrc: Oral  Weight: 186 lb (84.4 kg)  Height: 5\' 7"  (1.702 m)   Body mass index is 29.13 kg/m. Physical Exam  Constitutional: She is oriented to person, place, and time. She appears well-developed and well-nourished. No distress.  HENT:  Head: Normocephalic and atraumatic.  Cardiovascular: Normal rate, regular rhythm, normal heart sounds and intact distal pulses.  Pulmonary/Chest: Effort normal and breath sounds normal. No respiratory distress.  Abdominal: Bowel sounds are normal.  Musculoskeletal: Normal range of motion. She exhibits tenderness.  Right base of thumb, neck and trapezius muscle  Neurological: She is alert and oriented to person, place, and time.  Skin: Skin is warm and dry.  Psychiatric: She has a normal mood and affect.    Labs reviewed: Basic Metabolic Panel: Recent Labs    02/11/17 0846  NA 142  K 4.3  CL 106  CO2 29  GLUCOSE 104*  BUN 10  CREATININE 0.56*  CALCIUM 9.2  TSH 0.01*    Liver Function Tests: Recent Labs    02/11/17 0846  AST 15  ALT 15  BILITOT 0.5  PROT 5.8*   No results for input(s): LIPASE, AMYLASE in the last 8760 hours. No results for input(s): AMMONIA in the last 8760 hours. CBC: Recent Labs    02/11/17 0846  WBC 6.4  NEUTROABS 3,930  HGB 13.5  HCT 41.0  MCV 86.5  PLT 222   Lipid Panel: Recent Labs    02/11/17 0846  CHOL 160  HDL 41*  LDLCALC 94  TRIG 149  CHOLHDL 3.9   Lab Results  Component Value Date   HGBA1C 5.5 08/27/2015    Assessment/Plan 1. Body mass index 29.0-29.9, adult -ongoing, cont to work on weight loss with dietary changes and walking  2. Overweight (BMI 25.0-29.9) -as above, diet information provided  3. Migraines -cont to follow with Dr. Brett Fairy  4. OSA on CPAP -cont cpap and neurology f/u  5. Hypothyroidism, unspecified type -cont current levothyroxine as managed by Dr. Forde Dandy  6. History of fall within past 90 days -fell twice, likely related to meds she takes for her chronic pain, headaches  7. Primary osteoarthritis involving multiple joints - given hard script to take to pharmacy of choice (was not covered by insurance) - diclofenac sodium (VOLTAREN) 1 % GEL; Apply 4 g topically 3 (three) times daily.  Dispense: 2 Tube; Refill: 3  Labs/tests ordered: No orders of the defined types were placed in this encounter. need labs from Dr. Forde Dandy  Next appt:  10/05/2017; f/u with me in 6 mos   Jemuel Laursen L. Ravindra Baranek, D.O. [facility] [address], [place] [address] Cell Phone (Mon-Fri 8am-5pm):  [phone] On Call:  [phone] & follow prompts after 5pm & weekends Office Phone:  [phone] Office Fax:  [phone]

## 2017-08-17 NOTE — Patient Instructions (Signed)
Fat and Cholesterol Restricted Diet Getting too much fat and cholesterol in your diet may cause health problems. Following this diet helps keep your fat and cholesterol at normal levels. This can keep you from getting sick. What types of fat should I choose?  Choose monosaturated and polyunsaturated fats. These are found in foods such as olive oil, canola oil, flaxseeds, walnuts, almonds, and seeds.  Eat more omega-3 fats. Good choices include salmon, mackerel, sardines, tuna, flaxseed oil, and ground flaxseeds.  Limit saturated fats. These are in animal products such as meats, butter, and cream. They can also be in plant products such as palm oil, palm kernel oil, and coconut oil.  Avoid foods with partially hydrogenated oils in them. These contain trans fats. Examples of foods that have trans fats are stick margarine, some tub margarines, cookies, crackers, and other baked goods. What general guidelines do I need to follow?  Check food labels. Look for the words "trans fat" and "saturated fat."  When preparing a meal: ? Fill half of your plate with vegetables and green salads. ? Fill one fourth of your plate with whole grains. Look for the word "whole" as the first word in the ingredient list. ? Fill one fourth of your plate with lean protein foods.  Eat more foods that have fiber, like apples, carrots, beans, peas, and barley.  Eat more home-cooked foods. Eat less at restaurants and buffets.  Limit or avoid alcohol.  Limit foods high in starch and sugar.  Limit fried foods.  Cook foods without frying them. Baking, boiling, grilling, and broiling are all great options.  Lose weight if you are overweight. Losing even a small amount of weight can help your overall health. It can also help prevent diseases such as diabetes and heart disease. What foods can I eat? Grains Whole grains, such as whole wheat or whole grain breads, crackers, cereals, and pasta. Unsweetened oatmeal,  bulgur, barley, quinoa, or brown rice. Corn or whole wheat flour tortillas. Vegetables Fresh or frozen vegetables (raw, steamed, roasted, or grilled). Green salads. Fruits All fresh, canned (in natural juice), or frozen fruits. Meat and Other Protein Products Ground beef (85% or leaner), grass-fed beef, or beef trimmed of fat. Skinless chicken or turkey. Ground chicken or turkey. Pork trimmed of fat. All fish and seafood. Eggs. Dried beans, peas, or lentils. Unsalted nuts or seeds. Unsalted canned or dry beans. Dairy Low-fat dairy products, such as skim or 1% milk, 2% or reduced-fat cheeses, low-fat ricotta or cottage cheese, or plain low-fat yogurt. Fats and Oils Tub margarines without trans fats. Light or reduced-fat mayonnaise and salad dressings. Avocado. Olive, canola, sesame, or safflower oils. Natural peanut or almond butter (choose ones without added sugar and oil). The items listed above may not be a complete list of recommended foods or beverages. Contact your dietitian for more options. What foods are not recommended? Grains White bread. White pasta. White rice. Cornbread. Bagels, pastries, and croissants. Crackers that contain trans fat. Vegetables White potatoes. Corn. Creamed or fried vegetables. Vegetables in a cheese sauce. Fruits Dried fruits. Canned fruit in light or heavy syrup. Fruit juice. Meat and Other Protein Products Fatty cuts of meat. Ribs, chicken wings, bacon, sausage, bologna, salami, chitterlings, fatback, hot dogs, bratwurst, and packaged luncheon meats. Liver and organ meats. Dairy Whole or 2% milk, cream, half-and-half, and cream cheese. Whole milk cheeses. Whole-fat or sweetened yogurt. Full-fat cheeses. Nondairy creamers and whipped toppings. Processed cheese, cheese spreads, or cheese curds. Sweets and Desserts Corn   syrup, sugars, honey, and molasses. Candy. Jam and jelly. Syrup. Sweetened cereals. Cookies, pies, cakes, donuts, muffins, and ice  cream. Fats and Oils Butter, stick margarine, lard, shortening, ghee, or bacon fat. Coconut, palm kernel, or palm oils. Beverages Alcohol. Sweetened drinks (such as sodas, lemonade, and fruit drinks or punches). The items listed above may not be a complete list of foods and beverages to avoid. Contact your dietitian for more information. This information is not intended to replace advice given to you by your health care provider. Make sure you discuss any questions you have with your health care provider. Document Released: 08/12/2011 Document Revised: 10/18/2015 Document Reviewed: 05/12/2013 Elsevier Interactive Patient Education  2018 Elsevier Inc.  

## 2017-08-18 ENCOUNTER — Ambulatory Visit (INDEPENDENT_AMBULATORY_CARE_PROVIDER_SITE_OTHER): Payer: Medicare Other | Admitting: Sports Medicine

## 2017-08-18 DIAGNOSIS — M1811 Unilateral primary osteoarthritis of first carpometacarpal joint, right hand: Secondary | ICD-10-CM | POA: Diagnosis not present

## 2017-08-18 MED ORDER — METHYLPREDNISOLONE ACETATE 40 MG/ML IJ SUSP
20.0000 mg | Freq: Once | INTRAMUSCULAR | Status: AC
  Administered 2017-08-18: 20 mg via INTRA_ARTICULAR

## 2017-08-18 NOTE — Progress Notes (Deleted)
Subjective:    [name] is a 77 y.o. old female here ***  HPI  PMH/Problem List: has Right shoulder pain; Foot pain, left; Vitreous degeneration of right eye; Nuclear sclerosis; History of giardia infection; Atrophic vaginitis; OA (osteoarthritis) of knee; Postoperative anemia due to acute blood loss; OSA on CPAP; Hypomania (mild) single episode or unspecified; Exertional dyspnea; History of rheumatic fever; UARS (upper airway resistance syndrome); Insomnia secondary to depression with anxiety; DJD (degenerative joint disease), cervical; Subacute ethmoidal sinusitis; Preop cardiovascular exam; Melena; Acute recurrent maxillary sinusitis; Slow transit constipation; Vitamin D deficiency; Thyroid activity decreased; Cephalalgia; Chronic infection of sinus; Deflected nasal septum; Hypertrophy of nasal turbinates; Nasal septal ulcer; Other fatigue; Primary osteoarthritis of both hands; Osteopenia of multiple sites; Metatarsalgia of both feet; Migraines; At risk for injury related to fall; Cough; History of total knee arthroplasty, bilateral; MCI (mild cognitive impairment) with memory loss; Intolerance of continuous positive airway pressure (CPAP) ventilation; Therapeutic opioid-induced constipation (OIC); Overweight (BMI 25.0-29.9); Dry mouth; Arthritis of carpometac[facility]al [facility]) joint of right thumb; Migraine with aura and with status migrainosus, not intractable; Floaters in visual field, bilateral; and Head injury consultation on their problem list.   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  FH:  Family History  Problem Relation Age of Onset  . Breast cancer  Mother   . Aneurysm Father   . Migraines Father   . Breast cancer Maternal Aunt   . Breast cancer Maternal Aunt   . Breast cancer Maternal Aunt   . Breast cancer Maternal Aunt   . High blood pressure Brother   . Melanoma Brother   . Kidney cancer Brother        Lesion on kidney  . Heart disease Unknown        Grandmother    SH Social History   Tobacco Use  . Smoking status: Passive Smoke Exposure - Never Smoker  . Smokeless tobacco: Never Used  . 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 Alcohol use: Yes    Alcohol/week: 0.0 oz    Comment: 1 glass - occas.  . Drug use: No    Review of Systems Review of systems negative except for pertinent positives and negatives in history of present illness above.     Objective:     Vitals:   08/18/17 1109  BP: 124/68  Weight: 186 lb (84.4 kg)  Height: 5' 6.5" (1.689 m)   Body mass index is 29.57 kg/m.  Physical Exam  GEN: appears well & comfortable. No apparent distress. Head: normocephalic and atraumatic  Eyes: conjunctiva without injection. Sclera anicteric. Ears: external ear, ear canal and TM normal Nares: no rhinorrhea. *** swollen turbinates. ***erythema of nasal mucosa Oropharynx: MMM. No erythema. ***exudation or petechiae.  Uvula midline HEM: negative for cervical or periauricular lymphadenopathies CVS: RRR, nl s1 & s2, no murmurs, no edema,  2+ *** pulses bilaterally, cap refills brisk RESP: no IWOB, good air movement bilaterally, CTAB GI: BS present & normal, soft, NTND, no guarding, no rebound, no palpable mass GU: no suprapubic or CVA tenderness*** MSK: no focal tenderness or notable swelling SKIN: no apparent skin lesion *** ENDO: negative thyromegally *** NEURO: alert and oiented appropriately, no gross deficits  PSYCH: euthymic mood with congruent affect ***     Assessment and Plan:     No follow-ups on file.  Mercy Riding, MD 08/18/17 Pager:  [phone]

## 2017-08-18 NOTE — Progress Notes (Signed)
Patient with right-sided [facility] joint arthritis  Patient has chronic pain along her right thumb and into the right proximal wrist This hurts worse with using her hands She is doing water aerobics but it hurts to hold a dumbbell Sometimes the area appears a little bit swollen Prior ultrasound in the office confirmed the arthritis primarily located to the [facility] joint  Review of systems  2 recent falls This caused a recurrence of her migraine headaches that has been almost daily  CT scan by neurology Was normal Is migraines have been well controlled with doxepin and tramadol   physical examination Pleasant female in no acute distress BP 124/68   Ht 5' 6.5" (1.689 m)   Wt 186 lb (84.4 kg)   LMP 02/25/1995 (Approximate)   BMI 29.57 kg/m  Tenderness over the right CMC joint Squaring of the CMC joint Decreased pinch and grip Mild swelling directly over the joint  Ultrasound  this confirms calcification and degenerative changes in the [facility] joint  there is a small joint effusion present  Procedure:  Injection of RT CMC joint Consent obtained and verified. Time-out conducted. Noted no overlying erythema, induration, or other signs of local infection. Skin prepped in a sterile fashion. Topical analgesic spray: Ethyl chloride. Completed without difficulty using US guidance Meds: 1/2 cc solumedrol 20 and 1/2 cc lidocaine 1% Pain immediately improved suggesting accurate placement of the medication. Advised to call if fevers/chills, erythema, induration, drainage, or persistent bleeding.

## 2017-08-18 NOTE — Assessment & Plan Note (Signed)
For CSI today We may repeat q 3 mos if this offers her good relief  Use splints from hand surgeon tor rest  We will continue her Celebrex as she has some generalized OS in several areas

## 2017-08-19 ENCOUNTER — Telehealth: Payer: Self-pay

## 2017-08-19 NOTE — Telephone Encounter (Signed)
-----   Message from Lester Nahunta, RN sent at 08/19/2017  5:06 PM EDT -----   ----- Message ----- From: Melvenia Beam, MD Sent: 08/19/2017   4:27 PM To: Gildardo Griffes, RN  CT of the head normal for age

## 2017-08-19 NOTE — Telephone Encounter (Signed)
I called pt, advised her that Dr. Jaynee Eagles reviewed pt's CT head in Dr. Edwena Felty absence and found that it was norma for her age. Pt verbalized understanding of results. Pt had no questions at this time but was encouraged to call back if questions arise.

## 2017-08-20 DIAGNOSIS — Z713 Dietary counseling and surveillance: Secondary | ICD-10-CM | POA: Diagnosis not present

## 2017-08-20 DIAGNOSIS — R7309 Other abnormal glucose: Secondary | ICD-10-CM | POA: Diagnosis not present

## 2017-08-20 DIAGNOSIS — Z6829 Body mass index (BMI) 29.0-29.9, adult: Secondary | ICD-10-CM | POA: Diagnosis not present

## 2017-08-25 ENCOUNTER — Ambulatory Visit: Payer: Self-pay | Admitting: Nurse Practitioner

## 2017-08-26 DIAGNOSIS — L308 Other specified dermatitis: Secondary | ICD-10-CM | POA: Diagnosis not present

## 2017-08-28 DIAGNOSIS — R7309 Other abnormal glucose: Secondary | ICD-10-CM | POA: Diagnosis not present

## 2017-08-28 DIAGNOSIS — Z713 Dietary counseling and surveillance: Secondary | ICD-10-CM | POA: Diagnosis not present

## 2017-08-28 DIAGNOSIS — Z6828 Body mass index (BMI) 28.0-28.9, adult: Secondary | ICD-10-CM | POA: Diagnosis not present

## 2017-09-04 DIAGNOSIS — Z6828 Body mass index (BMI) 28.0-28.9, adult: Secondary | ICD-10-CM | POA: Diagnosis not present

## 2017-09-04 DIAGNOSIS — R7309 Other abnormal glucose: Secondary | ICD-10-CM | POA: Diagnosis not present

## 2017-09-04 DIAGNOSIS — K5900 Constipation, unspecified: Secondary | ICD-10-CM | POA: Diagnosis not present

## 2017-09-04 DIAGNOSIS — E039 Hypothyroidism, unspecified: Secondary | ICD-10-CM | POA: Diagnosis not present

## 2017-09-04 DIAGNOSIS — Z713 Dietary counseling and surveillance: Secondary | ICD-10-CM | POA: Diagnosis not present

## 2017-09-07 ENCOUNTER — Other Ambulatory Visit: Payer: Self-pay | Admitting: *Deleted

## 2017-09-07 MED ORDER — CELEBREX 200 MG PO CAPS
200.0000 mg | ORAL_CAPSULE | Freq: Every day | ORAL | 0 refills | Status: DC
Start: 2017-09-07 — End: 2017-12-01

## 2017-09-11 DIAGNOSIS — Z713 Dietary counseling and surveillance: Secondary | ICD-10-CM | POA: Diagnosis not present

## 2017-09-11 DIAGNOSIS — R7309 Other abnormal glucose: Secondary | ICD-10-CM | POA: Diagnosis not present

## 2017-09-11 DIAGNOSIS — Z6828 Body mass index (BMI) 28.0-28.9, adult: Secondary | ICD-10-CM | POA: Diagnosis not present

## 2017-09-17 ENCOUNTER — Other Ambulatory Visit: Payer: Self-pay | Admitting: Rheumatology

## 2017-09-18 DIAGNOSIS — Z713 Dietary counseling and surveillance: Secondary | ICD-10-CM | POA: Diagnosis not present

## 2017-09-18 DIAGNOSIS — R7309 Other abnormal glucose: Secondary | ICD-10-CM | POA: Diagnosis not present

## 2017-09-18 DIAGNOSIS — Z6827 Body mass index (BMI) 27.0-27.9, adult: Secondary | ICD-10-CM | POA: Diagnosis not present

## 2017-09-22 ENCOUNTER — Other Ambulatory Visit: Payer: Self-pay | Admitting: *Deleted

## 2017-09-22 MED ORDER — TRAMADOL HCL 50 MG PO TABS: 50.0000 mg | ORAL_TABLET | Freq: Three times a day (TID) | ORAL | 3 refills | Status: DC | PRN

## 2017-09-24 DIAGNOSIS — R7309 Other abnormal glucose: Secondary | ICD-10-CM | POA: Diagnosis not present

## 2017-09-24 DIAGNOSIS — K5900 Constipation, unspecified: Secondary | ICD-10-CM | POA: Diagnosis not present

## 2017-09-24 DIAGNOSIS — Z713 Dietary counseling and surveillance: Secondary | ICD-10-CM | POA: Diagnosis not present

## 2017-09-24 DIAGNOSIS — Z6827 Body mass index (BMI) 27.0-27.9, adult: Secondary | ICD-10-CM | POA: Diagnosis not present

## 2017-10-02 ENCOUNTER — Telehealth: Payer: Self-pay | Admitting: Internal Medicine

## 2017-10-02 NOTE — Telephone Encounter (Signed)
I left a message on both home and mobile numbers asking the patient if she can come in earlier on Monday morning. VDM (DD)

## 2017-10-05 ENCOUNTER — Ambulatory Visit: Payer: Medicare Other | Admitting: Internal Medicine

## 2017-10-05 ENCOUNTER — Ambulatory Visit: Payer: Medicare Other

## 2017-10-06 ENCOUNTER — Telehealth: Payer: Self-pay

## 2017-10-06 NOTE — Telephone Encounter (Signed)
Called to reschedule AWV-S after missed appointment yesterday. There was no answer, I left voicemail and callback number

## 2017-10-13 ENCOUNTER — Ambulatory Visit: Payer: Medicare Other | Admitting: Neurology

## 2017-10-21 ENCOUNTER — Telehealth: Payer: Self-pay | Admitting: Neurology

## 2017-10-21 NOTE — Telephone Encounter (Signed)
Pt said [employer] is faxing a form that will need to signed by Dr D for her to get CPAP supplies. Pt said she in great need for the supplies.

## 2017-10-21 NOTE — Telephone Encounter (Signed)
Orders received. Will order and fax back to [employer] as soon as Dr Dohmeier signs.

## 2017-10-22 DIAGNOSIS — R7309 Other abnormal glucose: Secondary | ICD-10-CM | POA: Diagnosis not present

## 2017-10-22 DIAGNOSIS — Z713 Dietary counseling and surveillance: Secondary | ICD-10-CM | POA: Diagnosis not present

## 2017-10-22 DIAGNOSIS — Z6829 Body mass index (BMI) 29.0-29.9, adult: Secondary | ICD-10-CM | POA: Diagnosis not present

## 2017-10-22 NOTE — Telephone Encounter (Signed)
Pt called stating she is really needing the CPAP supplies as soon as possible, requesting a call once orders are sent.

## 2017-10-22 NOTE — Telephone Encounter (Signed)
Called the patient and informed her that the order was signed and sent over yesterday with a receipt confirmation at 5:12pm. Informed her that I can resend the order but gave her this information. Patient states [facility] informed her they didn't have the order and was going to place a hold on her account where it could be 48 hours. Pt states she is in desperate need for supplies and asked for the number to the local office. I gave her the number I had and she states when she calls that she doesn't get anyone locally. I informed her that I can reach out through email to [facility] and also resend the signed order. Pt was appreciative for my help.

## 2017-10-27 DIAGNOSIS — M542 Cervicalgia: Secondary | ICD-10-CM | POA: Diagnosis not present

## 2017-10-27 DIAGNOSIS — R51 Headache: Secondary | ICD-10-CM | POA: Diagnosis not present

## 2017-10-27 DIAGNOSIS — M47812 Spondylosis without myelopathy or radiculopathy, cervical region: Secondary | ICD-10-CM | POA: Diagnosis not present

## 2017-10-28 ENCOUNTER — Ambulatory Visit (INDEPENDENT_AMBULATORY_CARE_PROVIDER_SITE_OTHER): Payer: Medicare Other

## 2017-10-28 VITALS — BP 130/66 | HR 84 | Temp 97.6°F | Ht 67.0 in | Wt 183.0 lb

## 2017-10-28 DIAGNOSIS — Z0000 Encounter for general adult medical examination without abnormal findings: Secondary | ICD-10-CM | POA: Diagnosis not present

## 2017-10-28 DIAGNOSIS — Z23 Encounter for immunization: Secondary | ICD-10-CM | POA: Diagnosis not present

## 2017-10-28 NOTE — Patient Instructions (Signed)
[name] , Thank you for taking time to come for your Medicare Wellness Visit. I appreciate your ongoing commitment to your health goals. Please review the following plan we discussed and let me know if I can assist you in the future.   Screening recommendations/referrals: Colonoscopy excluded, over age 77 Mammogram excluded, over age 34 Bone Density up to date Recommended yearly ophthalmology/optometry visit for glaucoma screening and checkup Recommended yearly dental visit for hygiene and checkup  Vaccinations: Influenza vaccine given today Pneumococcal vaccine up to date, completed Tdap vaccine up to date, due 05/31/2027 Shingles vaccine up to date, completed    Advanced directives: in chart  Conditions/risks identified: none  Next appointment: Dr. Mariea Clonts 12/14/2017 @ 1:30pm            Tyson Dense, RN 11/03/2018 @ 2:00pm   Preventive Care 58 Years and Older, Female Preventive care refers to lifestyle choices and visits with your health care provider that can promote health and wellness. What does preventive care include?  A yearly physical exam. This is also called an annual well check.  Dental exams once or twice a year.  Routine eye exams. Ask your health care provider how often you should have your eyes checked.  Personal lifestyle choices, including:  Daily care of your teeth and gums.  Regular physical activity.  Eating a healthy diet.  Avoiding tobacco and drug use.  Limiting alcohol use.  Practicing safe sex.  Taking low-dose aspirin every day.  Taking vitamin and mineral supplements as recommended by your health care provider. What happens during an annual well check? The services and screenings done by your health care provider during your annual well check will depend on your age, overall health, lifestyle risk factors, and family history of disease. Counseling  Your health care provider may ask you questions about your:  Alcohol use.  Tobacco  use.  Drug use.  Emotional well-being.  Home and relationship well-being.  Sexual activity.  Eating habits.  History of falls.  Memory and ability to understand (cognition).  Work and work Statistician.  Reproductive health. Screening  You may have the following tests or measurements:  Height, weight, and BMI.  Blood pressure.  Lipid and cholesterol levels. These may be checked every 5 years, or more frequently if you are over 71 years old.  Skin check.  Lung cancer screening. You may have this screening every year starting at age 19 if you have a 30-pack-year history of smoking and currently smoke or have quit within the past 15 years.  Fecal occult blood test (FOBT) of the stool. You may have this test every year starting at age 16.  Flexible sigmoidoscopy or colonoscopy. You may have a sigmoidoscopy every 5 years or a colonoscopy every 10 years starting at age 41.  Hepatitis C blood test.  Hepatitis B blood test.  Sexually transmitted disease (STD) testing.  Diabetes screening. This is done by checking your blood sugar (glucose) after you have not eaten for a while (fasting). You may have this done every 1-3 years.  Bone density scan. This is done to screen for osteoporosis. You may have this done starting at age 86.  Mammogram. This may be done every 1-2 years. Talk to your health care provider about how often you should have regular mammograms. Talk with your health care provider about your test results, treatment options, and if necessary, the need for more tests. Vaccines  Your health care provider may recommend certain vaccines, such as:  Influenza  vaccine. This is recommended every year.  Tetanus, diphtheria, and acellular pertussis (Tdap, Td) vaccine. You may need a Td booster every 10 years.  Zoster vaccine. You may need this after age 39.  Pneumococcal 13-valent conjugate (PCV13) vaccine. One dose is recommended after age 70.  Pneumococcal  polysaccharide (PPSV23) vaccine. One dose is recommended after age 65. Talk to your health care provider about which screenings and vaccines you need and how often you need them. This information is not intended to replace advice given to you by your health care provider. Make sure you discuss any questions you have with your health care provider. Document Released: 03/09/2015 Document Revised: 10/31/2015 Document Reviewed: 12/12/2014 Elsevier Interactive Patient Education  2017 [place] Prevention in the Home Falls can cause injuries. They can happen to people of all ages. There are many things you can do to make your home safe and to help prevent falls. What can I do on the outside of my home?  Regularly fix the edges of walkways and driveways and fix any cracks.  Remove anything that might make you trip as you walk through a door, such as a raised step or threshold.  Trim any bushes or trees on the path to your home.  Use bright outdoor lighting.  Clear any walking paths of anything that might make someone trip, such as rocks or tools.  Regularly check to see if handrails are loose or broken. Make sure that both sides of any steps have handrails.  Any raised decks and porches should have guardrails on the edges.  Have any leaves, snow, or ice cleared regularly.  Use sand or salt on walking paths during winter.  Clean up any spills in your garage right away. This includes oil or grease spills. What can I do in the bathroom?  Use night lights.  Install grab bars by the toilet and in the tub and shower. Do not use towel bars as grab bars.  Use non-skid mats or decals in the tub or shower.  If you need to sit down in the shower, use a plastic, non-slip stool.  Keep the floor dry. Clean up any water that spills on the floor as soon as it happens.  Remove soap buildup in the tub or shower regularly.  Attach bath mats securely with double-sided non-slip rug  tape.  Do not have throw rugs and other things on the floor that can make you trip. What can I do in the bedroom?  Use night lights.  Make sure that you have a light by your bed that is easy to reach.  Do not use any sheets or blankets that are too big for your bed. They should not hang down onto the floor.  Have a firm chair that has side arms. You can use this for support while you get dressed.  Do not have throw rugs and other things on the floor that can make you trip. What can I do in the kitchen?  Clean up any spills right away.  Avoid walking on wet floors.  Keep items that you use a lot in easy-to-reach places.  If you need to reach something above you, use a strong step stool that has a grab bar.  Keep electrical cords out of the way.  Do not use floor polish or wax that makes floors slippery. If you must use wax, use non-skid floor wax.  Do not have throw rugs and other things on the floor that can  make you trip. What can I do with my stairs?  Do not leave any items on the stairs.  Make sure that there are handrails on both sides of the stairs and use them. Fix handrails that are broken or loose. Make sure that handrails are as long as the stairways.  Check any carpeting to make sure that it is firmly attached to the stairs. Fix any carpet that is loose or worn.  Avoid having throw rugs at the top or bottom of the stairs. If you do have throw rugs, attach them to the floor with carpet tape.  Make sure that you have a light switch at the top of the stairs and the bottom of the stairs. If you do not have them, ask someone to add them for you. What else can I do to help prevent falls?  Wear shoes that:  Do not have high heels.  Have rubber bottoms.  Are comfortable and fit you well.  Are closed at the toe. Do not wear sandals.  If you use a stepladder:  Make sure that it is fully opened. Do not climb a closed stepladder.  Make sure that both sides of the  stepladder are locked into place.  Ask someone to hold it for you, if possible.  Clearly mark and make sure that you can see:  Any grab bars or handrails.  First and last steps.  Where the edge of each step is.  Use tools that help you move around (mobility aids) if they are needed. These include:  Canes.  Walkers.  Scooters.  Crutches.  Turn on the lights when you go into a dark area. Replace any light bulbs as soon as they burn out.  Set up your furniture so you have a clear path. Avoid moving your furniture around.  If any of your floors are uneven, fix them.  If there are any pets around you, be aware of where they are.  Review your medicines with your doctor. Some medicines can make you feel dizzy. This can increase your chance of falling. Ask your doctor what other things that you can do to help prevent falls. This information is not intended to replace advice given to you by your health care provider. Make sure you discuss any questions you have with your health care provider. Document Released: 12/07/2008 Document Revised: 07/19/2015 Document Reviewed: 03/17/2014 Elsevier Interactive Patient Education  2017 [employer].

## 2017-10-28 NOTE — Progress Notes (Signed)
Subjective:   [name] is a 77 y.o. female who presents for Medicare Annual (Subsequent) preventive examination.  Last AWV-09/23/2016    Objective:     Vitals: BP 130/66 (BP Location: Left Arm, Patient Position: Sitting)   Pulse 84   Temp 97.6 F (36.4 C) (Oral)   Ht 5\' 7"  (1.702 m)   Wt 183 lb (83 kg)   LMP 02/25/1995 (Approximate)   SpO2 95%   BMI 28.66 kg/m   Body mass index is 28.66 kg/m.  Advanced Directives 10/28/2017 08/17/2017 09/25/2016 01/22/2016 08/31/2015 08/20/2015 07/31/2015  Does Patient Have a Medical Advance Directive? Yes Yes Yes No No Yes Yes  Type of Advance Directive Healthcare Power of [place];Living will  Does patient want to make changes to medical advance directive? No - Patient declined No - Patient declined - - - - -  Copy of [place] in Chart? Yes Yes Yes - - No - copy requested -  Would patient like information on creating a medical advance directive? - - - - - - -  Pre-existing out of facility DNR order (yellow form or pink MOST form) - - - - - - -  Some encounter information is confidential and restricted. Go to Review Flowsheets activity to see all data.    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      Counseling given: Not Answered Comment: flight attendant on smoking plane    Clinical Intake:  Pre-visit preparation completed: No  Pain : 0-10 Pain Score: 5  Pain Type: Chronic pain Pain Descriptors / Indicators: Aching Pain Onset: More than a month ago Pain Frequency: Constant     Nutritional Risks: None Diabetes: No  How often do you need to have someone help you when you read instructions, pamphlets, or other written materials from your doctor or pharmacy?: 1 - Never What is the last  grade level you completed in school?: [employer]  Interpreter Needed?: No  Information entered by :: Tyson Dense, RN  Past Medical History:  Diagnosis Date  . 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 Constipation   . Depression   . Fibromyalgia 8/98   [place]  . 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 Impaired hearing    left ear, wears hearing aids  . Insomnia    circadian rhythm component  . Insomnia   . Migraine 10/88   [place]  . Nuclear sclerosis   . OSA on CPAP 2/07   uses cpap setting of 10  . Osteoarthritis 2007   [place]  . Pain last week   left under breast pain   . Plantar fasciitis   . Scarlet fever as child  . Thyroid disorder   . Vitreous degeneration of right eye    Past Surgical History:  Procedure Laterality Date  . BREAST BIOPSY Left 6/00   [place]  . BREAST EXCISIONAL BIOPSY Left 1998  . DE QUERVAIN'S RELEASE Right 10/97   [place]  . left breast biopsy    . ROOT CANAL  02/11/2012  . TONSILLECTOMY  age 15  . TONSILLECTOMY AND ADENOIDECTOMY  1952  . 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 TOTAL KNEE ARTHROPLASTY Right 11/08/2012   Procedure: RIGHT TOTAL KNEE ARTHROPLASTY;  Surgeon: Pilar Plate  Zella Ball, MD;  Location: [facility];  Service: Orthopedics;  Laterality: Right;  . TUBAL LIGATION     Family History  Problem Relation Age of Onset  . Breast cancer Mother   . Aneurysm Father   . Migraines Father   . Breast cancer Maternal Aunt   . Breast cancer Maternal Aunt   . Breast cancer Maternal Aunt   . Breast cancer Maternal Aunt   . High blood pressure Brother   . Melanoma Brother   . Kidney cancer Brother        Lesion on kidney  . Heart disease Unknown        Grandmother   Social History   Socioeconomic History  . Marital status: Married    Spouse name: [name]  . Number of children: 0  . Years of education: 17  . Highest education level: Not on file  Occupational History  . Not on file  Social Needs  . Financial resource  strain: Not hard at all  . Food insecurity:    Worry: Never true    Inability: Never true  . Transportation needs:    Medical: No    Non-medical: No  Tobacco Use  . Smoking status: Passive Smoke Exposure - Never Smoker  . Smokeless tobacco: Never Used  . 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 Alcohol use: Yes    Alcohol/week: 0.0 standard drinks    Comment: 1 glass - occas.  . Drug use: No  . Sexual activity: Not Currently    Partners: Male    Birth control/protection: Post-menopausal  Lifestyle  . Physical activity:    Days per week: 4 days    Minutes per session: 60 min  . Stress: Only a little  Relationships  . Social connections:    Talks on phone: More than three times a week    Gets together: More than three times a week    Attends religious service: More than 4 times per year    Active member of club or organization: No    Attends meetings of clubs or organizations: Never    Relationship status: Married  Other Topics Concern  . Not on file  Social History Narrative   Patient is married [name]).   Patient drinks 2-4 cups of coffee daily.   Patient is retired.   Patient has two years of college.   Patient is right-handed.                Outpatient Encounter Medications as of 10/28/2017  Medication Sig  . aspirin EC 81 MG tablet Take 81 mg by mouth daily.  . Aspirin-Acetaminophen-Caffeine (EXCEDRIN MIGRAINE PO) Take by mouth daily.   Marland Kitchen azelastine (ASTELIN) 0.1 % nasal spray USE 1 SPRAY IN EACH NOSTRIL EVERY DAY  . CELEBREX 200 MG capsule Take 1 capsule (200 mg total) by mouth daily.  . Cholecalciferol (VITAMIN D3) 5000 units CAPS Take by mouth.  . diclofenac sodium (VOLTAREN) 1 % GEL Apply 4 g topically 3 (three) times daily.  Marland Kitchen doxepin (SINEQUAN) 100 MG capsule Take 1 capsule (100 mg total) by mouth at bedtime.  . Eszopiclone 3 MG TABS Take 1 tablet (3 mg total) by mouth at bedtime. Take immediately before  bedtime  . fluconazole (DIFLUCAN) 150 MG tablet Take one tablet po, may repeat in 72 hours if symptoms are still present.  Marland Kitchen levothyroxine (SYNTHROID, LEVOTHROID) 200 MCG tablet Take 200 mcg by mouth daily before  breakfast.  . LORazepam (ATIVAN) 1 MG tablet Take 1 mg by mouth 2 (two) times daily.   . Melatonin 3 MG TABS Take 10 mg by mouth daily.   . montelukast (SINGULAIR) 10 MG tablet Take 10 mg by mouth every evening.  . Multiple Vitamin (MULTIVITAMIN) capsule Take 1 capsule by mouth daily.  . Omega-3 Fatty Acids (FISH OIL) 1000 MG CAPS Take by mouth daily.  . polyethylene glycol (MIRALAX / GLYCOLAX) packet Take 17 g by mouth daily.  . traMADol (ULTRAM) 50 MG tablet Take 1 tablet (50 mg total) by mouth 3 (three) times daily as needed.  . valACYclovir (VALTREX) 1000 MG tablet Take 1,000 mg by mouth as needed.  . vitamin C (ASCORBIC ACID) 500 MG tablet Take 500 mg by mouth daily.   No facility-administered encounter medications on file as of 10/28/2017.     Activities of Daily Living In your present state of health, do you have any difficulty performing the following activities: 10/28/2017  Hearing? Y  Vision? N  Difficulty concentrating or making decisions? Y  Walking or climbing stairs? N  Dressing or bathing? N  Doing errands, shopping? N  Preparing Food and eating ? N  Using the Toilet? N  In the past six months, have you accidently leaked urine? N  Do you have problems with loss of bowel control? N  Managing your Medications? N  Managing your Finances? N  Housekeeping or managing your Housekeeping? N  Some recent data might be hidden    [name], [name] Megan Salon, MD as Consulting Physician (Gynecology) Tiajuana Amass, MD as Referring Physician (Allergy and Immunology) Jari Pigg, MD as Consulting Physician (Dermatology) Luberta Mutter, MD as Consulting Physician (Ophthalmology) Plonk, Vivien Presto, MD as  Referring Physician (Otolaryngology) Dohmeier, Asencion Partridge, MD as Consulting Physician (Neurology) Reynold Bowen, MD as Consulting Physician (Endocrinology) Lendon Colonel, NP as Nurse Practitioner (Nurse Practitioner)    Assessment:   This is a routine wellness examination for Maite.  Exercise Activities and Dietary recommendations Current Exercise Habits: Home exercise routine, Type of exercise: Other - see comments(water aerobics), Time (Minutes): 60, Frequency (Times/Week): 4, Weekly Exercise (Minutes/Week): 240, Intensity: Mild, Exercise limited by: None identified  Goals   None     Fall Risk Fall Risk  10/28/2017 08/17/2017 03/12/2017 02/05/2017 09/23/2016  Falls in the past year? Yes Yes No No Yes  Number falls in past yr: 2 or more 2 or more - - 2 or more  Comment - - - - -  Injury with Fall? Yes Yes - - No  Risk for fall due to : - - - - -   Is the patient's home free of loose throw rugs in walkways, pet beds, electrical cords, etc?   yes      Grab bars in the bathroom? no      Handrails on the stairs?   yes      Adequate lighting?   yes  Timed Get Up and Go performed:10 seconds  Depression Screen PHQ 2/9 Scores 10/28/2017 08/17/2017 02/05/2017 09/23/2016  PHQ - 2 Score 0 0 0 6  PHQ- 9 Score - - - 15  Exception Documentation - - - -     Cognitive Function MMSE - Mini Mental State Exam 10/28/2017 09/23/2016 08/31/2015  Orientation to time 5 5 4   Orientation to Place 5 5 5   Registration 3 3 3   Attention/ Calculation 5 5 4  Recall 2 2 3   Language- name 2 objects 2 2 2   Language- repeat 1 1 1   Language- follow 3 step command 3 3 1   Language- read & follow direction 1 1 1   Write a sentence 1 1 1   Copy design 1 1 1   Total score 29 29 26         Immunization History  Administered Date(s) Administered  . Influenza, High Dose Seasonal PF 12/08/2016, 10/28/2017  . Pneumococcal Conjugate-13 10/18/2013  . Pneumococcal Polysaccharide-23 11/01/2012  . Tdap 10/10/2010,  05/30/2017  . Zoster 02/24/2002  . Zoster Recombinat (Shingrix) 06/24/2016    Qualifies for Shingles Vaccine? Up to date, completed  Screening Tests Health Maintenance  Topic Date Due  . MAMMOGRAM  04/21/2018  . COLONOSCOPY  09/11/2018  . TETANUS/TDAP  05/31/2027  . DEXA SCAN  Completed  . PNA vac Low Risk Adult  Completed    Cancer Screenings: Lung: Low Dose CT Chest recommended if Age 58-80 years, 30 pack-year currently smoking OR have quit w/in 15years. Patient does not qualify. Breast:  Up to date on Mammogram? Yes   Up to date of Bone Density/Dexa? Yes Colorectal: up to date  Additional Screenings:  Hepatitis C Screening: declined High Dose Flu vaccine due: given today    Plan:    I have personally reviewed and addressed the Medicare Annual Wellness questionnaire and have noted the following in the patient's chart:  A. Medical and social history B. Use of alcohol, tobacco or illicit drugs  C. Current medications and supplements D. Functional ability and status E.  Nutritional status F.  Physical activity G. Advance directives H. List of other physicians I.  Hospitalizations, surgeries, and ER visits in previous 12 months J.  [place] to include hearing, vision, cognitive, depression L. Referrals and appointments - none  In addition, I have reviewed and discussed with patient certain preventive protocols, quality metrics, and best practice recommendations. A written personalized care plan for preventive services as well as general preventive health recommendations were provided to patient.  See attached scanned questionnaire for additional information.   Signed,   Tyson Dense, RN Nurse Health Advisor  Patient Concerns: None

## 2017-10-29 DIAGNOSIS — R7309 Other abnormal glucose: Secondary | ICD-10-CM | POA: Diagnosis not present

## 2017-10-29 DIAGNOSIS — Z6828 Body mass index (BMI) 28.0-28.9, adult: Secondary | ICD-10-CM | POA: Diagnosis not present

## 2017-10-29 DIAGNOSIS — E039 Hypothyroidism, unspecified: Secondary | ICD-10-CM | POA: Diagnosis not present

## 2017-10-29 DIAGNOSIS — Z713 Dietary counseling and surveillance: Secondary | ICD-10-CM | POA: Diagnosis not present

## 2017-10-29 DIAGNOSIS — K5900 Constipation, unspecified: Secondary | ICD-10-CM | POA: Diagnosis not present

## 2017-11-03 DIAGNOSIS — F331 Major depressive disorder, recurrent, moderate: Secondary | ICD-10-CM | POA: Diagnosis not present

## 2017-11-05 DIAGNOSIS — R7309 Other abnormal glucose: Secondary | ICD-10-CM | POA: Diagnosis not present

## 2017-11-05 DIAGNOSIS — Z6828 Body mass index (BMI) 28.0-28.9, adult: Secondary | ICD-10-CM | POA: Diagnosis not present

## 2017-11-05 DIAGNOSIS — Z713 Dietary counseling and surveillance: Secondary | ICD-10-CM | POA: Diagnosis not present

## 2017-11-10 DIAGNOSIS — M8580 Other specified disorders of bone density and structure, unspecified site: Secondary | ICD-10-CM | POA: Diagnosis not present

## 2017-11-10 DIAGNOSIS — E559 Vitamin D deficiency, unspecified: Secondary | ICD-10-CM | POA: Diagnosis not present

## 2017-11-10 DIAGNOSIS — E038 Other specified hypothyroidism: Secondary | ICD-10-CM | POA: Diagnosis not present

## 2017-11-10 DIAGNOSIS — G4739 Other sleep apnea: Secondary | ICD-10-CM | POA: Diagnosis not present

## 2017-11-10 DIAGNOSIS — E785 Hyperlipidemia, unspecified: Secondary | ICD-10-CM | POA: Diagnosis not present

## 2017-11-10 DIAGNOSIS — G4733 Obstructive sleep apnea (adult) (pediatric): Secondary | ICD-10-CM | POA: Diagnosis not present

## 2017-11-10 DIAGNOSIS — G43909 Migraine, unspecified, not intractable, without status migrainosus: Secondary | ICD-10-CM | POA: Diagnosis not present

## 2017-11-10 DIAGNOSIS — M797 Fibromyalgia: Secondary | ICD-10-CM | POA: Diagnosis not present

## 2017-11-12 DIAGNOSIS — R7309 Other abnormal glucose: Secondary | ICD-10-CM | POA: Diagnosis not present

## 2017-11-12 DIAGNOSIS — Z6828 Body mass index (BMI) 28.0-28.9, adult: Secondary | ICD-10-CM | POA: Diagnosis not present

## 2017-11-12 DIAGNOSIS — Z713 Dietary counseling and surveillance: Secondary | ICD-10-CM | POA: Diagnosis not present

## 2017-11-23 ENCOUNTER — Encounter: Payer: Self-pay | Admitting: Nurse Practitioner

## 2017-11-23 ENCOUNTER — Ambulatory Visit (INDEPENDENT_AMBULATORY_CARE_PROVIDER_SITE_OTHER): Payer: Medicare Other | Admitting: Nurse Practitioner

## 2017-11-23 VITALS — BP 126/88 | HR 96 | Temp 98.4°F | Ht 66.0 in | Wt 177.0 lb

## 2017-11-23 DIAGNOSIS — J029 Acute pharyngitis, unspecified: Secondary | ICD-10-CM | POA: Diagnosis not present

## 2017-11-23 NOTE — Patient Instructions (Signed)
To use tylenol (acetaminophen) 1000 mg by mouth every 8 hours as needed for sore throat To continue salt water gargles Drink warm tea with lemon and honey If symptoms persist, worsen or  fever occurs to notify   Pharyngitis Pharyngitis is redness, pain, and swelling (inflammation) of the throat (pharynx). It is a very common cause of sore throat. Pharyngitis can be caused by a bacteria, but it is usually caused by a virus. Most cases of pharyngitis get better on their own without treatment. What are the causes? This condition may be caused by:  Infection by viruses (viral). Viral pharyngitis spreads from person to person (is contagious) through coughing, sneezing, and sharing of personal items or utensils such as cups, forks, spoons, and toothbrushes.  Infection by bacteria (bacterial). Bacterial pharyngitis may be spread by touching the nose or face after coming in contact with the bacteria, or through more intimate contact, such as kissing.  Allergies. Allergies can cause buildup of mucus in the throat (post-nasal drip), leading to inflammation and irritation. Allergies can also cause blocked nasal passages, forcing breathing through the mouth, which dries and irritates the throat.  What increases the risk? You are more likely to develop this condition if:  You are 74-77 years old.  You are exposed to crowded environments such as daycare, school, or dormitory living.  You live in a cold climate.  You have a weakened disease-fighting (immune) system.  What are the signs or symptoms? Symptoms of this condition vary by the cause (viral, bacterial, or allergies) and can include:  Sore throat.  Fatigue.  Low-grade fever.  Headache.  Joint pain and muscle aches.  Skin rashes.  Swollen glands in the throat (lymph nodes).  Plaque-like film on the throat or tonsils. This is often a symptom of bacterial pharyngitis.  Vomiting.  Stuffy nose (nasal congestion).  Cough.  Red,  itchy eyes (conjunctivitis).  Loss of appetite.  How is this diagnosed? This condition is often diagnosed based on your medical history and a physical exam. Your health care provider will ask you questions about your illness and your symptoms. A swab of your throat may be done to check for bacteria (rapid strep test). Other lab tests may also be done, depending on the suspected cause, but these are rare. How is this treated? This condition usually gets better in 3-4 days without medicine. Bacterial pharyngitis may be treated with antibiotic medicines. Follow these instructions at home:  Take over-the-counter and prescription medicines only as told by your health care provider. ? If you were prescribed an antibiotic medicine, take it as told by your health care provider. Do not stop taking the antibiotic even if you start to feel better. ? Do not give children aspirin because of the association with Reye syndrome.  Drink enough water and fluids to keep your urine clear or pale yellow.  Get a lot of rest.  Gargle with a salt-water mixture 3-4 times a day or as needed. To make a salt-water mixture, completely dissolve -1 tsp of salt in 1 cup of warm water.  If your health care provider approves, you may use throat lozenges or sprays to soothe your throat. Contact a health care provider if:  You have large, tender lumps in your neck.  You have a rash.  You cough up green, yellow-brown, or bloody spit. Get help right away if:  Your neck becomes stiff.  You drool or are unable to swallow liquids.  You cannot drink or take medicines without  vomiting.  You have severe pain that does not go away, even after you take medicine.  You have trouble breathing, and it is not caused by a stuffy nose.  You have new pain and swelling in your joints such as the knees, ankles, wrists, or elbows. Summary  Pharyngitis is redness, pain, and swelling (inflammation) of the throat  (pharynx).  While pharyngitis can be caused by a bacteria, the most common causes are viral.  Most cases of pharyngitis get better on their own without treatment.  Bacterial pharyngitis is treated with antibiotic medicines. This information is not intended to replace advice given to you by your health care provider. Make sure you discuss any questions you have with your health care provider. Document Released: 02/10/2005 Document Revised: 03/18/2016 Document Reviewed: 03/18/2016 Elsevier Interactive Patient Education  [employer].

## 2017-11-23 NOTE — Progress Notes (Signed)
Careteam: [name], [name] Megan Salon, MD as Consulting Physician (Gynecology) Tiajuana Amass, MD as Referring Physician (Allergy and Immunology) Jari Pigg, MD as Consulting Physician (Dermatology) Luberta Mutter, MD as Consulting Physician (Ophthalmology) Plonk, Vivien Presto, MD as Referring Physician (Otolaryngology) Dohmeier, Asencion Partridge, MD as Consulting Physician (Neurology) Reynold Bowen, MD as Consulting Physician (Endocrinology) Lendon Colonel, NP as Nurse Practitioner (Nurse Practitioner)  Advanced Directive information    Allergies  Allergen Reactions  . Penicillins Anaphylaxis    Tongue swelling  . Codeine Nausea Only  . Provigil [Modafinil]   . Seroquel [Quetiapine Fumarate]   . Xyrem [Sodium Oxybate]     hallucination  . Ciprofloxacin Rash  . Monistat [Miconazole] Rash  . Terazol [Terconazole] Rash    Chief Complaint  Patient presents with  . Acute Visit    pt is being seen due to sore throat for 3 days and pt states that it is getting worse daily.      HPI: Patient is a 77 y.o. female seen in the office today due to a very sore throat. Started 3 days ago. Normally happens when she has sinus infection.  Had some sinus issues prior to to throat hurting but no current sinus issues.  Has seasonal allergies and sees allergist.  Taking Singulair daily and 2 nasal sprays which are prescribed by allergist.  Denies itchy eyes or runny nose.  No fevers.  No cough.  Has had several sick contacts.    Review of Systems:  Review of Systems  Constitutional: Positive for malaise/fatigue. Negative for fever.  HENT: Positive for sore throat. Negative for congestion, ear pain, hearing loss and sinus pain.     Past Medical History:  Diagnosis Date  . 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 Constipation   . Depression   . Fibromyalgia 8/98   [place]  . 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 Impaired hearing    left ear, wears hearing aids  . Insomnia    circadian rhythm component  . Insomnia   . Migraine 10/88   Spillman  . Nuclear sclerosis   . OSA on CPAP 2/07   uses cpap setting of 10  . Osteoarthritis 2007   [place]war  . Pain last week   left under breast pain   . Plantar fasciitis   . Scarlet fever as child  . Thyroid disorder   . Vitreous degeneration of right eye    Past Surgical History:  Procedure Laterality Date  . BREAST BIOPSY Left 6/00   [place]  . BREAST EXCISIONAL BIOPSY Left 1998  . DE QUERVAIN'S RELEASE Right 10/97   [place]her  . left breast biopsy    . ROOT CANAL  02/11/2012  . TONSILLECTOMY  age 61  . TONSILLECTOMY AND ADENOIDECTOMY  1952  . 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 TOTAL KNEE ARTHROPLASTY Right 11/08/2012   Procedure: RIGHT TOTAL KNEE ARTHROPLASTY;  Surgeon: Gearlean Alf, MD;  Location: [facility];  Service: Orthopedics;  Laterality: Right;  . TUBAL LIGATION     Social History:   reports that she is a non-smoker but has been exposed to tobacco smoke. She has been exposed to tobacco smoke for the past 30.00 years. She has never used smokeless tobacco. She reports that she drinks alcohol. She reports that she does not use drugs.  Family History  Problem Relation Age of Onset  . Breast cancer Mother   .  Aneurysm Father   . Migraines Father   . Breast cancer Maternal Aunt   . Breast cancer Maternal Aunt   . Breast cancer Maternal Aunt   . Breast cancer Maternal Aunt   . High blood pressure Brother   . Melanoma Brother   . Kidney cancer Brother        Lesion on kidney  . Heart disease Unknown        Grandmother    Medications: Patient's Medications  New Prescriptions   No medications on file  Previous Medications   ASPIRIN EC 81 MG TABLET    Take 81 mg by mouth daily.   ASPIRIN-ACETAMINOPHEN-CAFFEINE (EXCEDRIN MIGRAINE PO)    Take by mouth daily.    AZELASTINE (ASTELIN) 0.1 % NASAL SPRAY    USE 1 SPRAY IN  EACH NOSTRIL EVERY DAY   CELEBREX 200 MG CAPSULE    Take 1 capsule (200 mg total) by mouth daily.   CHOLECALCIFEROL (VITAMIN D3) 5000 UNITS CAPS    Take by mouth.   DOXEPIN (SINEQUAN) 100 MG CAPSULE    Take 1 capsule (100 mg total) by mouth at bedtime.   ESZOPICLONE 3 MG TABS    Take 1 tablet (3 mg total) by mouth at bedtime. Take immediately before bedtime   FLUCONAZOLE (DIFLUCAN) 150 MG TABLET    Take one tablet po, may repeat in 72 hours if symptoms are still present.   LEVOTHYROXINE (SYNTHROID, LEVOTHROID) 200 MCG TABLET    Take 200 mcg by mouth daily before breakfast.   LORAZEPAM (ATIVAN) 1 MG TABLET    Take 1 mg by mouth 2 (two) times daily.    MELATONIN 3 MG TABS    Take 15 mg by mouth daily.    MONTELUKAST (SINGULAIR) 10 MG TABLET    Take 10 mg by mouth every evening.   MULTIPLE VITAMIN (MULTIVITAMIN) CAPSULE    Take 1 capsule by mouth daily.   OMEGA-3 FATTY ACIDS (FISH OIL) 1000 MG CAPS    Take by mouth daily.   POLYETHYLENE GLYCOL (MIRALAX / GLYCOLAX) PACKET    Take 17 g by mouth daily.   TRAMADOL (ULTRAM) 50 MG TABLET    Take 1 tablet (50 mg total) by mouth 3 (three) times daily as needed.   VALACYCLOVIR (VALTREX) 1000 MG TABLET    Take 1,000 mg by mouth as needed.   VITAMIN C (ASCORBIC ACID) 500 MG TABLET    Take 500 mg by mouth daily.  Modified Medications   No medications on file  Discontinued Medications   DICLOFENAC SODIUM (VOLTAREN) 1 % GEL    Apply 4 g topically 3 (three) times daily.     Physical Exam:  Vitals:   11/23/17 1527  BP: 126/88  Pulse: 96  Temp: 98.4 F (36.9 C)  TempSrc: Oral  SpO2: 97%  Weight: 177 lb (80.3 kg)  Height: 5\' 6"  (1.676 m)   Body mass index is 28.57 kg/m.  Physical Exam  Constitutional: She is oriented to person, place, and time. She appears well-developed and well-nourished.  HENT:  Head: Normocephalic and atraumatic.  Right Ear: External ear normal.  Left Ear: External ear normal.  Nose: Nose normal.  Mouth/Throat:  Oropharynx is clear and moist. No oropharyngeal exudate.  Eyes: Pupils are equal, round, and reactive to light.  Neck: Normal range of motion. Neck supple. No tracheal deviation present. No thyromegaly present.  Cardiovascular: Normal rate, regular rhythm and normal heart sounds.  Pulmonary/Chest: Effort normal and breath sounds normal.  Lymphadenopathy:    She has no cervical adenopathy.  Neurological: She is alert and oriented to person, place, and time.    Labs reviewed: Basic Metabolic Panel: Recent Labs    02/11/17 0846  NA 142  K 4.3  CL 106  CO2 29  GLUCOSE 104*  BUN 10  CREATININE 0.56*  CALCIUM 9.2  TSH 0.01*   Liver Function Tests: Recent Labs    02/11/17 0846  AST 15  ALT 15  BILITOT 0.5  PROT 5.8*   No results for input(s): LIPASE, AMYLASE in the last 8760 hours. No results for input(s): AMMONIA in the last 8760 hours. CBC: Recent Labs    02/11/17 0846  WBC 6.4  NEUTROABS 3,930  HGB 13.5  HCT 41.0  MCV 86.5  PLT 222   Lipid Panel: Recent Labs    02/11/17 0846  CHOL 160  HDL 41*  LDLCALC 94  TRIG 149  CHOLHDL 3.9   TSH: Recent Labs    02/11/17 0846  TSH 0.01*   A1C: Lab Results  Component Value Date   HGBA1C 5.5 08/27/2015     Assessment/Plan 1. Pharyngitis, unspecified etiology -supportive care at this time, appears viral. To use tylenol (acetaminophen) 1000 mg by mouth every 8 hours as needed for sore throat To use salt water gargles Drink warm tea with lemon and honey If symptoms persist, worsen or  fever occurs to notify   Next appt: 12/14/2017 Janett Billow K. [place], [facility] [phone]

## 2017-11-24 ENCOUNTER — Encounter: Payer: Self-pay | Admitting: Sports Medicine

## 2017-11-24 ENCOUNTER — Telehealth: Payer: Self-pay | Admitting: *Deleted

## 2017-11-24 MED ORDER — PREDNISONE 10 MG (21) PO TBPK: ORAL_TABLET | ORAL | 0 refills | Status: DC

## 2017-11-24 NOTE — Telephone Encounter (Signed)
Patient notified and agreed.  

## 2017-11-24 NOTE — Telephone Encounter (Signed)
Will send in prednisone dose pack which should help with symptoms. If these progress or fever occurs to notify

## 2017-11-24 NOTE — Telephone Encounter (Signed)
Patient called and stated that she was seen yesterday for a sore throat. Patient stated that it is worse today and difficult to swallow and moving into her sinuses. Having nasal congestion. Patient is scared she is going to have a long road if it goes on. Please Advise.

## 2017-11-25 DIAGNOSIS — J343 Hypertrophy of nasal turbinates: Secondary | ICD-10-CM | POA: Diagnosis not present

## 2017-11-25 DIAGNOSIS — J320 Chronic maxillary sinusitis: Secondary | ICD-10-CM | POA: Diagnosis not present

## 2017-11-25 DIAGNOSIS — M542 Cervicalgia: Secondary | ICD-10-CM | POA: Diagnosis not present

## 2017-11-25 DIAGNOSIS — J029 Acute pharyngitis, unspecified: Secondary | ICD-10-CM | POA: Diagnosis not present

## 2017-11-25 DIAGNOSIS — J300 Vasomotor rhinitis: Secondary | ICD-10-CM | POA: Diagnosis not present

## 2017-11-26 ENCOUNTER — Ambulatory Visit (INDEPENDENT_AMBULATORY_CARE_PROVIDER_SITE_OTHER): Payer: Medicare Other | Admitting: Sports Medicine

## 2017-11-26 ENCOUNTER — Encounter: Payer: Self-pay | Admitting: Sports Medicine

## 2017-11-26 VITALS — BP 100/60 | Ht 65.5 in | Wt 186.0 lb

## 2017-11-26 DIAGNOSIS — M4722 Other spondylosis with radiculopathy, cervical region: Secondary | ICD-10-CM

## 2017-11-26 MED ORDER — PREDNISONE 20 MG PO TABS: 20.0000 mg | ORAL_TABLET | Freq: Two times a day (BID) | ORAL | 0 refills | Status: DC

## 2017-11-26 NOTE — Progress Notes (Signed)
     Subjective: Patient with CC of: Sore Throat and Ear pain  Patient states about one week ago she developed a sore throat that first felt like "a sinus infection or a cold" but then became worse and moved over the the right side with pain radiating to her right ear. She tried lozenges, herbal supplements, nasal irrigation, decongestants, gargling with warm salt water and nothing has helped. She has not had any jaw pain, no dizziness, no tinnitus, no difficulty swallowing solids or liquids, no fever, or chills. She does endorse some congestion and some decreased hearing.  Complete ENT evaluation yesterday found no sign of infection, congestion or inflammation  Past Hx:  Significant degenerative disc dz of neck Forminal spurs are present in higher cervical levels This has been controlled with tramadol and doxepin    ROS noted in HPI. No fever or chills/ no rhinitis  Past Medical, Surgical, Social, and Family History Reviewed & Updated per EMR.   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    Objective: BP 100/60   Ht 5' 5.5" (1.664 m)   Wt 186 lb (84.4 kg)   LMP 02/25/1995 (Approximate)   BMI 30.48 kg/m  Vitals and nursing notes reviewed  Physical Exam Gen: Alert and Oriented x 3, NAD HEENT: Normocephalic, atraumatic, PERRLA, EOMI,non-erythematous pharyngeal mucosa, no exudates Neck: trachea midline, no thyroidmegaly, no LAD, right carotid no tender to palpation MSK: Moves all four extremities Ext: no clubbing, cyanosis, or edema Neuro: No gross deficits Skin: warm, dry, intact, no rashes  Limited ROM of neck Motion triggers some pain No results found for this or any previous visit (from the past 72 hour(s)).  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  - Start Prednisone 20mg  BID for 7 days and follow up in one week. - Continue Tramadol and Doxepin. - Apply heat as needed - Cervical collar at night   PATIENT EDUCATION PROVIDED: See AVS    Diagnosis and plan along with any newly prescribed medication(s) were discussed in detail with this patient today. The patient verbalized understanding and agreed with the plan. Patient advised if symptoms worsen return to clinic or ER.   Health Maintainance:   No orders of the defined types were placed in this encounter.   No orders of the defined types were placed in this encounter.    Harolyn Rutherford, DO 11/26/2017, 3:15 PM PGY-2 [facility]  I observed and examined the patient with the resident and agree with assessment and plan.  Note reviewed and modified by me. [name], MD

## 2017-11-26 NOTE — Assessment & Plan Note (Addendum)
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  - Start Prednisone 20mg  BID for 7 days and follow up in one week. - Continue Tramadol and Doxepin. - Apply heat as needed - Cervical collar at night

## 2017-11-26 NOTE — Patient Instructions (Signed)
It was great to meet you today! Thank you for letting me participate in your care!  Today, we discussed your throat and neck pain. You have significant narrowing of the space where your nerve roots exit your cervical spine and the spurs on the cervical spine could have caused irritation of one of your nerve roots (C3) that innervates your throat and ear causing your sore throat and ear pain. Continue taking your regular medications that control your migraines and and your neck pain.   We have sent a prescription for Prednisone 20mg  to take twice a day for 7 days. Please follow up with [place] in one week to ensure you are improving. If you develop symptoms such as severe aches, fever, chills, nausea, or vomiting please return to the clinic immediately or seek care that day at an urgent care.  Be well, Harolyn Rutherford, DO PGY-2, [facility]

## 2017-12-01 ENCOUNTER — Ambulatory Visit (INDEPENDENT_AMBULATORY_CARE_PROVIDER_SITE_OTHER): Payer: Medicare Other | Admitting: Sports Medicine

## 2017-12-01 VITALS — BP 112/60 | Ht 66.5 in | Wt 173.0 lb

## 2017-12-01 DIAGNOSIS — M5030 Other cervical disc degeneration, unspecified cervical region: Secondary | ICD-10-CM

## 2017-12-01 MED ORDER — CELEBREX 200 MG PO CAPS: 200.0000 mg | ORAL_CAPSULE | Freq: Every day | ORAL | 0 refills | Status: DC

## 2017-12-01 NOTE — Progress Notes (Addendum)
   HPI  CC: Neck pain [name] 77 year old female who presents for follow-up of neck pain.  She was seen last week in this clinic.  At that time she was found to have ear and throat pain.  This is thought to be radicular cervical spine pain.  She was given a prednisone 20 mg twice a day treatment for 7 days.  She states is completely resolved her symptoms.  She states her pain is no longer present, and she no longer has pain in her ear.  She denies any numbness and tingling on her hands.  She denies any weakness of her hands.  She denies any fine motor issues.  He has 3 days left of the steroid Dosepak.  See HPI and/or previous note for associated ROS.  Objective: BP 112/60   Ht 5' 6.5" (1.689 m)   Wt 173 lb (78.5 kg)   LMP 02/25/1995 (Approximate)   BMI 27.50 kg/m  Gen:NAD, well groomed, a/o x3, normal affect.  CV: Well-perfused. Warm.  Resp: Non-labored.  Neuro: Sensation intact throughout. No gross coordination deficits.  Gait: Kyphotic posture with forward positioning of the neck, unremarkable stride without signs of limp or balance issues.  Neck exam: No erythema, warmth, swelling noted.  No tenderness palpation along cervical spine or surrounding paraspinal muscles.  No tenderness palpation of trapezius muscle.  Full range of motion forward flexion, full range of motion extension, limited range of motion to around 15 degrees and side motion to the left into the right.  Strength 5 out of 5 throughout motion of the neck.  Strength 5 out of 5 throughout upper extremity testing.  Negative Spurling's test bilaterally.  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  [name] pain has since resolved on the course of steroids we gave her last week.  The ear pain she is experiencing was likely radicular from C3.  Since her symptoms are not resolved, she could likely benefit from physical therapy.  We will get her started  physical therapy to work on neck positioning and strengthening of the surrounding muscles.  We will see her back for follow-up in 6 weeks.  If in the interim, she develops any radicular numbness and tingling in her arms or weakness of her hands, we could consider getting an MRI for further evaluation of the nerve root.  [name], MD [facility] Fellow 12/01/2017 9:28 AM  I observed and examined the patient with the [facility] Fellow and agree with assessment and plan.  Note reviewed and modified by me.  Ila Mcgill, MD

## 2017-12-01 NOTE — Patient Instructions (Addendum)
You for coming to see [place] today in clinic.  You were previously diagnosed with degenerative changes in your cervical spine, which we believe may be causing some irritation of the nerves coming out of your spine.  You were given a course of steroids last week, which has resolved the symptoms.  I believe he could benefit from physical therapy and working on positioning of the neck.  We will send you this referral today.  We will see back for follow-up in 6 weeks.

## 2017-12-08 ENCOUNTER — Other Ambulatory Visit: Payer: Self-pay | Admitting: *Deleted

## 2017-12-08 DIAGNOSIS — M25511 Pain in right shoulder: Secondary | ICD-10-CM

## 2017-12-09 ENCOUNTER — Ambulatory Visit
Admission: RE | Admit: 2017-12-09 | Discharge: 2017-12-09 | Disposition: A | Discharge status: Home / Self Care | Payer: Medicare Other | Source: Ambulatory Visit | Attending: Sports Medicine | Admitting: Sports Medicine

## 2017-12-09 ENCOUNTER — Ambulatory Visit (INDEPENDENT_AMBULATORY_CARE_PROVIDER_SITE_OTHER): Payer: Medicare Other | Admitting: Sports Medicine

## 2017-12-09 ENCOUNTER — Other Ambulatory Visit: Payer: Self-pay | Admitting: Sports Medicine

## 2017-12-09 VITALS — BP 110/60

## 2017-12-09 DIAGNOSIS — S40011A Contusion of right shoulder, initial encounter: Secondary | ICD-10-CM

## 2017-12-09 DIAGNOSIS — S4991XA Unspecified injury of right shoulder and upper arm, initial encounter: Secondary | ICD-10-CM | POA: Diagnosis not present

## 2017-12-09 DIAGNOSIS — R0781 Pleurodynia: Secondary | ICD-10-CM

## 2017-12-09 DIAGNOSIS — M25511 Pain in right shoulder: Secondary | ICD-10-CM

## 2017-12-09 DIAGNOSIS — M898X1 Other specified disorders of bone, shoulder: Secondary | ICD-10-CM

## 2017-12-09 DIAGNOSIS — S2241XA Multiple fractures of ribs, right side, initial encounter for closed fracture: Secondary | ICD-10-CM | POA: Diagnosis not present

## 2017-12-09 NOTE — Patient Instructions (Signed)
Thank you for come to see [place] today in clinic.  You were evaluated for your scapula following a fall sustained last Saturday.  We will obtain x-rays at this time to run any acute fractures.  She can continue take tramadol and Celebrex for your pain at this time.  We will see back in 1 to 2 weeks to reassess once you are out of the acute pain phase.

## 2017-12-09 NOTE — Progress Notes (Signed)
   HPI  CC: Right scapular pain  [name] is a 77 year old female who presents acutely for right scapular pain.  She states that on Saturday of last week she had a fall while she was in [place] at [employer].  She states that she was turning better codon and tripped and fell onto her buttocks.  She states that she fell her right shoulder blade made contact with a low hanging cabinet.  She states she had acute pain following this and was unable to really move her shoulder.  Her husband noticed she had a big bruise of her shoulder blade.  They put ice over the area with some relief.  She has been taking tramadol as well as Celebrex for pain relief.  She states this is helped somewhat.  She states now she cannot get out of bed and her she is lying flat.  She states she needs assistance to do so.  She states she cannot stand from a sit very easily due to pain in her scapula.  She has difficulty with overhead activity, due to pain in her scapula when she raises her shoulder.  She states the pain is not bothering her at night, because she sleeps on her back.  She denies any numbness and tingling shooting down her arm.  She denies any weakness in the arm.  She does have a history of degenerative disc disease.  She states she has tried tramadol for pain relief.  She is also taking Celebrex starting this morning.  She states the tramadol helps somewhat with pain.  See HPI and/or previous note for associated ROS.  Objective: BP 110/60   LMP 02/25/1995 (Approximate)  Gen: Right-Hand Dominant. NAD, well groomed, a/o x3, normal affect.  CV: Well-perfused. Warm.  Resp: Non-labored.  Neuro: Sensation intact throughout. No gross coordination deficits.  Gait: Nonpathologic posture, unremarkable stride without signs of limp or balance issues.  Right shoulder exam: No erythema, warmth, swelling noted.  No tenderness to palpation of the shoulder joint.  Tenderness palpation over the lateral aspect of the scapula  extending down to the rib cage.  There is noticeable ecchymosis over the area of tenderness.  Range of motion limited to around 90 degrees and active range of motion in abduction.  Full range of motion in forward flexion.  Internal rotation limited to back pocket.  Full range of motion external rotation.  Strength 5 out of 5 throughout testing.  Significant pain with crossover test.  Patient unable to tolerate any other testing due to pain with overhead.  Assessment and plan:  Contusion of right scapula.  There is some concern for rib fractures on the right side versus injury of the scapula.  We discussed treatment options today.  We will obtain x-rays of her scapula as well as her ribs to rule out acute fractures.  She can continue take tramadol as needed for pain.  She did start Celebrex yesterday, which advised her to take on a scheduled basis for the next 10 days.  We will see her back for follow-up in 2 weeks.  We will reassess her pain when she is out of the acute phase.  Depending on her pain level, we can consider getting an MRI, versus starting physical therapy.  [name], MD [facility] Fellow 12/09/2017 11:05 AM

## 2017-12-10 ENCOUNTER — Other Ambulatory Visit: Payer: Self-pay | Admitting: *Deleted

## 2017-12-10 ENCOUNTER — Telehealth: Payer: Self-pay

## 2017-12-10 ENCOUNTER — Other Ambulatory Visit: Payer: Self-pay | Admitting: Sports Medicine

## 2017-12-10 DIAGNOSIS — R7309 Other abnormal glucose: Secondary | ICD-10-CM | POA: Diagnosis not present

## 2017-12-10 DIAGNOSIS — Z6828 Body mass index (BMI) 28.0-28.9, adult: Secondary | ICD-10-CM | POA: Diagnosis not present

## 2017-12-10 DIAGNOSIS — E039 Hypothyroidism, unspecified: Secondary | ICD-10-CM | POA: Diagnosis not present

## 2017-12-10 DIAGNOSIS — Z713 Dietary counseling and surveillance: Secondary | ICD-10-CM | POA: Diagnosis not present

## 2017-12-10 MED ORDER — HYDROCODONE-ACETAMINOPHEN 5-325MG PREPACK (~~LOC~~: ORAL_TABLET | ORAL | 0 refills | Status: DC

## 2017-12-10 NOTE — Telephone Encounter (Signed)
Patient aware of response and decided to keep appointment

## 2017-12-10 NOTE — Telephone Encounter (Signed)
Patient with 3 broken ribs and questions if she should reschedule physical appointment for Monday 12/14/17. If yes patient would like an idea on how long she should wait to reschedule.   Please advise

## 2017-12-10 NOTE — Telephone Encounter (Signed)
If she's able, I'd recommend she keep that appt.

## 2017-12-14 ENCOUNTER — Encounter: Payer: Self-pay | Admitting: Internal Medicine

## 2017-12-14 ENCOUNTER — Ambulatory Visit (INDEPENDENT_AMBULATORY_CARE_PROVIDER_SITE_OTHER): Payer: Medicare Other | Admitting: Internal Medicine

## 2017-12-14 VITALS — BP 110/70 | HR 64 | Temp 98.1°F | Ht 67.0 in | Wt 175.0 lb

## 2017-12-14 DIAGNOSIS — F418 Other specified anxiety disorders: Secondary | ICD-10-CM

## 2017-12-14 DIAGNOSIS — K5909 Other constipation: Secondary | ICD-10-CM

## 2017-12-14 DIAGNOSIS — R296 Repeated falls: Secondary | ICD-10-CM | POA: Insufficient documentation

## 2017-12-14 DIAGNOSIS — F5105 Insomnia due to other mental disorder: Secondary | ICD-10-CM | POA: Diagnosis not present

## 2017-12-14 DIAGNOSIS — G43009 Migraine without aura, not intractable, without status migrainosus: Secondary | ICD-10-CM

## 2017-12-14 DIAGNOSIS — M5030 Other cervical disc degeneration, unspecified cervical region: Secondary | ICD-10-CM

## 2017-12-14 MED ORDER — LINACLOTIDE 72 MCG PO CAPS: 72.0000 ug | ORAL_CAPSULE | Freq: Every day | ORAL | 3 refills | Status: DC

## 2017-12-14 NOTE — Patient Instructions (Signed)
Start taking linzess 72 mcg daily one hour before breakfast.  If this is effective after 3 days, continue this dose.  If you are still not moving your bowels, begin taking 2 of them one hour before breakfast.  Let me know what's happening  As soon as you are released for exercise, please get back to water aerobics and walking BUT ESPECIALLY going to physical therapy for your balance.    Avoid drinking alcohol with your medications.  These already increase your risk of falls.

## 2017-12-14 NOTE — Progress Notes (Signed)
Location:  [facility] clinic Provider:  Lamona Eimer L. Mariea Clonts, D.O., C.M.D.  Code Status: full code Goals of Care:  Advanced Directives 10/28/2017  Does Patient Have a Medical Advance Directive? Yes  Type of Advance Directive [place]  Does patient want to make changes to medical advance directive? No - Patient declined  Copy of [place] in Chart? Yes  Would patient like information on creating a medical advance directive? -  Pre-existing out of facility DNR order (yellow form or pink MOST form) -  Some encounter information is confidential and restricted. Go to Review Flowsheets activity to see all data.   Chief Complaint  Patient presents with  . Medical Management of Chronic Issues    extended visit    HPI: Patient is a 77 y.o. female seen today for medical management of chronic diseases.    They were in [place] with friends last weekend.  They were standing in a small restaurant foyer.  Waiters were coming in through the two doors with food.  [name] went to pick up a card.  She was putting her jacket on.  Next she knew, she was falling backwards.  She hit the back of her right shoulder on a heavy piece of wooden furniture (display case) on the corner. People came running, she got up and sat in a chair.  Another couple drove them back to the hotel.  Her pain was getting worse and worse.  Sunday, she stayed in bed most of the day, used ice, she did sit up with tv.  Monday, they left, flew back to [place] and then [place] drove from there.  She saw Dr. Leslye Peer.  xrays done and showed 5/6/7 right ribs fractured.  She has not taken any of the pain medicine. Reports one drink that night.  5 scoops of miralax nightly and not having bms even with that.  I had suggested linzess and her insurance would not cover.  If she goes too long without going, stools become hard and that's painful.   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  Used Kswiss in the past, then miralax one scoop and has gotten up to 5 now.    Had sore throat and Janett Billow had advised tylenol.  She then went to see Dr. Vicente Masson ENT who suggested that the nerve in her neck could be causing the pain.  Dr. Oneida Alar did note a bone spur and put her on prednisone so the pain sent away.  It felt like it was going into her right ear.  She also was referred for PT, but has not done this yet.    Has had 4 to 5 falls in the past year.  Again discussed the pain medicines, anxiety medicines affecting both falls and constipation.  Also had headaches.  Attempts to change meds have not been successful.  Head starts to hurt going down on tramadol.     She normally does water aerobics.  Her husband reports she needs to do more balance and strength exercises.  She also needs to stop carrying things in two hands.    She's been sleeping well with her current regimen also.    Past Medical History:  Diagnosis Date  . 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 Constipation   . Depression   . Fibromyalgia 8/98   [place]  . 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 Impaired hearing    left ear, wears hearing aids  . Insomnia    circadian rhythm component  . Insomnia   . Migraine 10/88   Spillman  . Nuclear sclerosis   . OSA on CPAP 2/07   uses cpap setting of 10  . Osteoarthritis 2007   [place]war  . Pain last week   left under breast pain   . Plantar fasciitis   . Scarlet fever as child  . Thyroid disorder   . Vitreous degeneration of right eye     Past Surgical History:  Procedure Laterality Date  . BREAST BIOPSY Left 6/00   [place]  . BREAST EXCISIONAL BIOPSY Left 1998  . DE QUERVAIN'S RELEASE Right 10/97   Sypher  . left breast biopsy    . ROOT CANAL  02/11/2012  . TONSILLECTOMY  age 66  . TONSILLECTOMY AND ADENOIDECTOMY  1952  . 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 TOTAL KNEE ARTHROPLASTY Right 11/08/2012    Procedure: RIGHT TOTAL KNEE ARTHROPLASTY;  Surgeon: Gearlean Alf, MD;  Location: [facility];  Service: Orthopedics;  Laterality: Right;  . TUBAL LIGATION      Allergies  Allergen Reactions  . Penicillins Anaphylaxis    Tongue swelling  . Codeine Nausea Only  . Provigil [Modafinil]   . Seroquel [Quetiapine Fumarate]   . Xyrem [Sodium Oxybate]     hallucination  . Ciprofloxacin Rash  . Monistat [Miconazole] Rash  . Terazol [Terconazole] Rash    Outpatient Encounter Medications as of 12/14/2017  Medication Sig  . aspirin EC 81 MG tablet Take 81 mg by mouth daily.  . Aspirin-Acetaminophen-Caffeine (EXCEDRIN MIGRAINE PO) Take by mouth daily.   Marland Kitchen azelastine (ASTELIN) 0.1 % nasal spray USE 1 SPRAY IN EACH NOSTRIL EVERY DAY  . CELEBREX 200 MG capsule Take 1 capsule (200 mg total) by mouth daily.  . Cholecalciferol (VITAMIN D3) 5000 units CAPS Take by mouth.  . doxepin (SINEQUAN) 100 MG capsule Take 1 capsule (100 mg total) by mouth at bedtime.  . Eszopiclone 3 MG TABS Take 1 tablet (3 mg total) by mouth at bedtime. Take immediately before bedtime  . levothyroxine (SYNTHROID, LEVOTHROID) 200 MCG tablet Take 200 mcg by mouth daily before breakfast.  . LORazepam (ATIVAN) 1 MG tablet Take 1 mg by mouth 2 (two) times daily.   . Melatonin 5 MG TABS Take 15 mg by mouth at bedtime.  . montelukast (SINGULAIR) 10 MG tablet Take 10 mg by mouth every evening.  . Multiple Vitamin (MULTIVITAMIN) capsule Take 1 capsule by mouth daily.  . Omega-3 Fatty Acids (FISH OIL) 1000 MG CAPS Take by mouth daily.  . polyethylene glycol (MIRALAX / GLYCOLAX) packet Take 17 g by mouth daily.  . traMADol (ULTRAM) 50 MG tablet Take 1 tablet (50 mg total) by mouth 3 (three) times daily as needed.  . valACYclovir (VALTREX) 1000 MG tablet Take 1,000 mg by mouth as needed.  . vitamin C (ASCORBIC ACID) 500 MG tablet Take 500 mg by mouth daily.  . [DISCONTINUED] HYDROcodone-acetaminophen (VICODIN) 5-325 mg TABS tablet Take  one tab two to three times a day prn pain  . [DISCONTINUED] Melatonin 3 MG TABS Take 15 mg by mouth daily.   . [DISCONTINUED] predniSONE (DELTASONE) 20 MG tablet Take 1 tablet (20 mg total) by mouth 2 (two) times daily.  . [DISCONTINUED] predniSONE (STERAPRED UNI-PAK 21 TAB) 10 MG (21) TBPK tablet Use as directed (Patient not taking: Reported on 12/01/2017)  No facility-administered encounter medications on file as of 12/14/2017.     Review of Systems:  Review of Systems  Constitutional: Negative for chills, fever and malaise/fatigue.  HENT: Negative for congestion.   Eyes: Negative for blurred vision.  Respiratory: Negative for shortness of breath.   Cardiovascular: Negative for chest pain, palpitations and leg swelling.  Gastrointestinal: Positive for abdominal pain and constipation. Negative for blood in stool, diarrhea and melena.  Genitourinary: Negative for dysuria.  Musculoskeletal: Positive for falls, myalgias and neck pain. Negative for joint pain.  Skin: Negative for itching and rash.  Neurological: Positive for headaches. Negative for dizziness and weakness.       Balance problem, no recent headaches when consistent with meds   Psychiatric/Behavioral: Negative for depression and memory loss. The patient is not nervous/anxious and does not have insomnia.        Spirits and sleep better lately    Health Maintenance  Topic Date Due  . MAMMOGRAM  04/21/2018  . COLONOSCOPY  09/11/2018  . TETANUS/TDAP  05/31/2027  . DEXA SCAN  Completed  . PNA vac Low Risk Adult  Completed    Physical Exam: Vitals:   12/14/17 1334  BP: 110/70  Pulse: 64  Temp: 98.1 F (36.7 C)  TempSrc: Oral  Weight: 175 lb (79.4 kg)  Height: 5\' 7"  (1.702 m)   Body mass index is 27.41 kg/m. Physical Exam  Constitutional: She is oriented to person, place, and time. She appears well-developed and well-nourished. No distress.  HENT:  Head: Normocephalic and atraumatic.  Cardiovascular: Normal  rate, regular rhythm, normal heart sounds and intact distal pulses.  Pulmonary/Chest: Effort normal and breath sounds normal. No respiratory distress.  Abdominal: Bowel sounds are normal.  Musculoskeletal: Normal range of motion. She exhibits tenderness.  Over right posterior upper ribs   Neurological: She is alert and oriented to person, place, and time.  Skin: Skin is warm and dry.  Ecchymoses of right posterior upper ribs and right hip  Psychiatric: She has a normal mood and affect.    Labs reviewed: Basic Metabolic Panel: Recent Labs    02/11/17 0846  NA 142  K 4.3  CL 106  CO2 29  GLUCOSE 104*  BUN 10  CREATININE 0.56*  CALCIUM 9.2  TSH 0.01*   Liver Function Tests: Recent Labs    02/11/17 0846  AST 15  ALT 15  BILITOT 0.5  PROT 5.8*   No results for input(s): LIPASE, AMYLASE in the last 8760 hours. No results for input(s): AMMONIA in the last 8760 hours. CBC: Recent Labs    02/11/17 0846  WBC 6.4  NEUTROABS 3,930  HGB 13.5  HCT 41.0  MCV 86.5  PLT 222   Lipid Panel: Recent Labs    02/11/17 0846  CHOL 160  HDL 41*  LDLCALC 94  TRIG 149  CHOLHDL 3.9   Lab Results  Component Value Date   HGBA1C 5.5 08/27/2015    Procedures since last visit: Dg Ribs Unilateral W/chest Right  Result Date: 12/09/2017 CLINICAL DATA:  77 year old female status post fall 5 days ago with continued right scapular and rib pain, difficulty abducting the right arm. EXAM: RIGHT RIBS AND CHEST - 3+ VIEW COMPARISON:  Right shoulder series today reported separately. FINDINGS: Minimally to mildly displaced fractures of the right posterolateral 5th through 7th ribs redemonstrated. A right rib marker is placed at the lateral 7th rib level. No pneumothorax, although there is possibly a trace right pleural effusion. No other right  rib fracture identified. Stable cardiac size and mediastinal contours. Stable left lung. Negative visible bowel gas pattern. IMPRESSION: 1. Minimally to  mildly displaced fractures of the right posterolateral 5th through 7th ribs. No pneumothorax but possible trace right pleural effusion. 2. No other acute cardiopulmonary abnormality or traumatic injury identified. Electronically Signed   By: Genevie Ann M.D.   On: 12/09/2017 14:50   Dg Scapula Right  Result Date: 12/09/2017 CLINICAL DATA:  77 year old female status post fall 5 days ago with continued right scapular region pain, difficulty abducting the right arm. EXAM: RIGHT SCAPULA - 2+ VIEWS COMPARISON:  Chest radiograph 09/20/2014. FINDINGS: AP and scapular Y-views. No glenohumeral joint dislocation. Proximal right humerus intact. Mild for age degenerative spurring at the right [facility] joint. Right clavicle and scapula appear intact. There are minimally to mildly displaced fractures of the right lateral 5th through 7th ribs (arrows). Negative visible right lung parenchyma. IMPRESSION: 1. Minimally to mildly displaced fractures of the right lateral 5th, 6th, and 7th ribs. Negative visible right lung parenchyma. 2. No superimposed acute fracture or dislocation at the right shoulder. Electronically Signed   By: Genevie Ann M.D.   On: 12/09/2017 14:47    Assessment/Plan 1. Chronic constipation - had even before tramadol, TCA, and benzo use - try linzess low dose to start, but increase if not working after three days to two capsules, if still not working, go to 279mcg - linaclotide (LINZESS) 72 MCG capsule; Take 1 capsule (72 mcg total) by mouth daily before breakfast.  Dispense: 30 capsule; Refill: 3 -keep miralax available, but not to take unless instructed to  2. Migraines -better with control of neck pain  3. Degenerative disc disease, cervical -cont regimen as per Dr. Oneida Alar -has not responded to many typical meds and was getting chronic migraines from the neck pain  4. Recurrent falls -4 in one year -encouraged water aerobics, walking and balance training with PT when "released" to exercise by Dr.  Oneida Alar -appears to be breathing well despite rib fxs and is using incentive spirometer  5. Insomnia secondary to depression with anxiety -continues on chronic doxepin and ativan for sleep and anxiety  Labs/tests ordered:  No orders of the defined types were placed in this encounter.   Next appt:  02/22/2018; f/u in feb   Yatzari Jonsson L. Lacrystal Barbe, D.O. [facility] [address], [place] [address] Cell Phone (Mon-Fri 8am-5pm):  [phone] On Call:  [phone] & follow prompts after 5pm & weekends Office Phone:  [phone] Office Fax:  [phone]

## 2017-12-15 ENCOUNTER — Ambulatory Visit (INDEPENDENT_AMBULATORY_CARE_PROVIDER_SITE_OTHER): Payer: Medicare Other | Admitting: Sports Medicine

## 2017-12-15 VITALS — BP 114/63 | Ht 66.5 in | Wt 173.0 lb

## 2017-12-15 DIAGNOSIS — S2241XA Multiple fractures of ribs, right side, initial encounter for closed fracture: Secondary | ICD-10-CM | POA: Diagnosis not present

## 2017-12-15 NOTE — Progress Notes (Addendum)
   HPI  CC: Right-sided rib pain [name] is a 77 year old female who presents for follow-up of her rib fractures.  She was seen last week following a fall where she hit her ribs on the corner of a table.  X-rays that time showed she had posterior lateral fifth through seventh rib fractures.  She has been using incentive spirometry since that time.  She states she is able to take a deep breath without significant pain.  She states her pain has improved since that time.  She only really notices the pain now when she is trying to get in the bed at nighttime.  She states she is able to sleep throughout the night without any disruptions.  She has been off the Percocet pain medication for the last 2 to 3 days.  She denies any breathing difficulties.  She denies any fevers chills.  She denies any cough.  She denies any chest pain.  See HPI and/or previous note for associated ROS.  Objective: BP 114/63   Ht 5' 6.5" (1.689 m)   Wt 173 lb (78.5 kg)   LMP 02/25/1995 (Approximate)   BMI 27.50 kg/m  Gen: Right2-Hand Dominant. NAD, well groomed, a/o x3, normal affect.  CV: Well-perfused. Warm.  Resp: Non-labored.  Neuro: Sensation intact throughout. No gross coordination deficits.  Gait: Nonpathologic posture, unremarkable stride without signs of limp or balance issues.  Shoulder exam: No erythema, swelling, warmth noted.  No tenderness palpation of the shoulder.  Full range of motion in forward flexion, abduction, internal and external rotation.  Strength 5 out of 5 throughout testing.  Negative Hawkins test, negative speeds test, negative empty can test.  Some pain with crossover test.  Back exam: No erythema, warmth, swelling noted.  Ecchymosis noted over the right posterior lateral rib cage.  Tenderness palpation over the posterior lateral rib cage around level of fifth through seventh rib.  Pain with rotation to the right side.  Full range of motion forward flexion, and extension but with  pain.  Assessment and plan: Right sided rib fractures, mildly displaced posterior lateral fifth through seventh ribs.  [name] continues to improve daily from her rib fractures.  We have advised at this time that she supplement her diet with 500 mg of vitamin C, 800 IU of vitamin D, and 1500 mg of calcium.  She was found to be osteopenic, and possibly osteoporotic by her PCP at previous visits.  We advised that she continue to not drive until 2 weeks following the injury.  She should continue with spirometry to keep her lungs open.  If she has worsening rib pain, I will consider starting a rib belt on her as well.  Otherwise, we will see her for follow-up in 4 weeks.  Brenda Rife, MD [facility] Fellow 12/15/2017 5:37 PM  I observed and examined the patient with the [facility] Fellow and agree with assessment and plan.  Note reviewed and modified by me. Brenda Mcgill, MD

## 2017-12-15 NOTE — Patient Instructions (Signed)
Take 1500 mg of calcium Take 800 IU of Vitamin D Take [phone] mg of Vitamin C  Walking is fine but keep pain level at ribs to 3/10 or below. No upper body exercise.

## 2017-12-16 ENCOUNTER — Ambulatory Visit: Payer: Medicare Other | Admitting: Internal Medicine

## 2017-12-17 DIAGNOSIS — K5904 Chronic idiopathic constipation: Secondary | ICD-10-CM | POA: Diagnosis not present

## 2017-12-17 DIAGNOSIS — K6389 Other specified diseases of intestine: Secondary | ICD-10-CM | POA: Diagnosis not present

## 2017-12-22 DIAGNOSIS — H2513 Age-related nuclear cataract, bilateral: Secondary | ICD-10-CM | POA: Diagnosis not present

## 2017-12-22 DIAGNOSIS — H524 Presbyopia: Secondary | ICD-10-CM | POA: Diagnosis not present

## 2017-12-23 DIAGNOSIS — K6389 Other specified diseases of intestine: Secondary | ICD-10-CM | POA: Diagnosis not present

## 2017-12-23 DIAGNOSIS — K5904 Chronic idiopathic constipation: Secondary | ICD-10-CM | POA: Diagnosis not present

## 2017-12-23 DIAGNOSIS — Z86010 Personal history of colonic polyps: Secondary | ICD-10-CM | POA: Insufficient documentation

## 2017-12-23 DIAGNOSIS — K5790 Diverticulosis of intestine, part unspecified, without perforation or abscess without bleeding: Secondary | ICD-10-CM | POA: Insufficient documentation

## 2017-12-24 ENCOUNTER — Ambulatory Visit: Payer: Medicare Other | Admitting: Sports Medicine

## 2017-12-24 DIAGNOSIS — Z6827 Body mass index (BMI) 27.0-27.9, adult: Secondary | ICD-10-CM | POA: Diagnosis not present

## 2017-12-24 DIAGNOSIS — E039 Hypothyroidism, unspecified: Secondary | ICD-10-CM | POA: Diagnosis not present

## 2017-12-24 DIAGNOSIS — Z713 Dietary counseling and surveillance: Secondary | ICD-10-CM | POA: Diagnosis not present

## 2017-12-30 ENCOUNTER — Telehealth: Payer: Self-pay | Admitting: Neurology

## 2017-12-30 NOTE — Telephone Encounter (Signed)
Thanks for making aware. This apt is ok since the patient was last seen in June 2019 and just needs to be seen yearly for cpap follow up.

## 2017-12-30 NOTE — Telephone Encounter (Signed)
FYI-Patient is scheduled for a CPAP follow up on 04-21-18 with Dr. Brett Fairy. She just wanted Dr. Brett Fairy to know this is the soonest appointment she could get to see her.

## 2018-01-12 ENCOUNTER — Ambulatory Visit (INDEPENDENT_AMBULATORY_CARE_PROVIDER_SITE_OTHER): Payer: Medicare Other | Admitting: Sports Medicine

## 2018-01-12 VITALS — BP 112/68 | Ht 66.5 in | Wt 175.0 lb

## 2018-01-12 DIAGNOSIS — S2241XD Multiple fractures of ribs, right side, subsequent encounter for fracture with routine healing: Secondary | ICD-10-CM

## 2018-01-12 NOTE — Progress Notes (Addendum)
   HPI  CC: Rib pain  [name] is a 77 year old female who presents for follow-up of rib fractures.  Sustained an injury on October 12, 19 when she tripped at [employer] and hit her ribs on a low hanging table.  She is found to have rib fractures on x-rays obtained on 12/09/2017.  She states she is doing much better since last time she was seen, which was on 12/15/2017.  She has not required any medications outside of the tramadol she takes for chronic neck pain.  She denies any numbness tingling shooting on her arms.  She denies any weakness of the lower extremities.  She states the pain is mostly present when she tries to get in bed at night.  She states it does not wake her up from sleep.  She states it is minimally affected her activities of daily living.  She states she has been walking in the pool without any difficulty.  See HPI and/or previous note for associated ROS.  Objective: BP 112/68   Ht 5' 6.5" (1.689 m)   Wt 175 lb (79.4 kg)   LMP 02/25/1995 (Approximate)   BMI 27.82 kg/m  Gen: Right-Hand Dominant. NAD, well groomed, a/o x3, normal affect.  CV: Well-perfused. Warm.  Resp: Non-labored.  Neuro: Sensation intact throughout. No gross coordination deficits.  Gait: Nonpathologic posture, unremarkable stride without signs of limp or balance issues.  Right upper extremity exam: No erythema, swelling, warmth noted.  No ecchymosis noted.  Mild tenderness palpation over the fifth through seventh posterior lateral ribs.  Full range of motion in flexion extension of the back.  Full range of motion of the right shoulder.  Strength 5 out of 5 throughout all upper extremity testing.  Assessment and plan: Right-sided rib fractures, mildly displaced posterior lateral fifth through seventh rib.  She is now 5 weeks out.  This is now resolving.  She continues to improve with the pain she is having her ribs.  She has not taken any medications outside of the tramadol which she takes daily  for neck pain.  She has continued with 800 IU of vitamin D, and 1500 mg of calcium.  She has known osteopenia, as noted on recent T-scores from last week.  I advised her that at 6 weeks, she can start slowly resuming back to her normal exercise.  That is a week from today.  She can now drive if her pain allows.  We will see her for follow-up in 1 month.  [name], MD [facility] Fellow 01/12/2018 1:15 PM  I observed and examined the patient with the [facility] Fellow and agree with assessment and plan.  Note reviewed and modified by me. [name], MD

## 2018-01-14 DIAGNOSIS — Z713 Dietary counseling and surveillance: Secondary | ICD-10-CM | POA: Diagnosis not present

## 2018-01-14 DIAGNOSIS — Z6827 Body mass index (BMI) 27.0-27.9, adult: Secondary | ICD-10-CM | POA: Diagnosis not present

## 2018-01-14 DIAGNOSIS — R7309 Other abnormal glucose: Secondary | ICD-10-CM | POA: Diagnosis not present

## 2018-01-14 DIAGNOSIS — K5900 Constipation, unspecified: Secondary | ICD-10-CM | POA: Diagnosis not present

## 2018-01-28 DIAGNOSIS — H25812 Combined forms of age-related cataract, left eye: Secondary | ICD-10-CM | POA: Diagnosis not present

## 2018-01-28 DIAGNOSIS — H2512 Age-related nuclear cataract, left eye: Secondary | ICD-10-CM | POA: Diagnosis not present

## 2018-01-29 ENCOUNTER — Telehealth: Payer: Self-pay

## 2018-01-29 MED ORDER — CELECOXIB 200 MG PO CAPS: 200.0000 mg | ORAL_CAPSULE | Freq: Every day | ORAL | 0 refills | Status: DC

## 2018-01-29 NOTE — Telephone Encounter (Signed)
Generic Celebrex sent to pharmacy.

## 2018-02-04 ENCOUNTER — Other Ambulatory Visit: Payer: Self-pay

## 2018-02-04 MED ORDER — TRAMADOL HCL 50 MG PO TABS: 50.0000 mg | ORAL_TABLET | Freq: Three times a day (TID) | ORAL | 3 refills | Status: DC | PRN

## 2018-02-11 DIAGNOSIS — H25811 Combined forms of age-related cataract, right eye: Secondary | ICD-10-CM | POA: Diagnosis not present

## 2018-02-11 DIAGNOSIS — H2511 Age-related nuclear cataract, right eye: Secondary | ICD-10-CM | POA: Diagnosis not present

## 2018-02-12 DIAGNOSIS — Z713 Dietary counseling and surveillance: Secondary | ICD-10-CM | POA: Diagnosis not present

## 2018-02-12 DIAGNOSIS — R7309 Other abnormal glucose: Secondary | ICD-10-CM | POA: Diagnosis not present

## 2018-02-12 DIAGNOSIS — Z6827 Body mass index (BMI) 27.0-27.9, adult: Secondary | ICD-10-CM | POA: Diagnosis not present

## 2018-02-22 ENCOUNTER — Ambulatory Visit (INDEPENDENT_AMBULATORY_CARE_PROVIDER_SITE_OTHER): Payer: Medicare Other | Admitting: Internal Medicine

## 2018-02-22 ENCOUNTER — Encounter: Payer: Self-pay | Admitting: Internal Medicine

## 2018-02-22 VITALS — BP 110/70 | HR 71 | Ht 67.0 in | Wt 176.0 lb

## 2018-02-22 DIAGNOSIS — R296 Repeated falls: Secondary | ICD-10-CM

## 2018-02-22 DIAGNOSIS — K5904 Chronic idiopathic constipation: Secondary | ICD-10-CM | POA: Diagnosis not present

## 2018-02-22 DIAGNOSIS — K5909 Other constipation: Secondary | ICD-10-CM | POA: Diagnosis not present

## 2018-02-22 DIAGNOSIS — R2689 Other abnormalities of gait and mobility: Secondary | ICD-10-CM

## 2018-02-22 DIAGNOSIS — M5030 Other cervical disc degeneration, unspecified cervical region: Secondary | ICD-10-CM

## 2018-02-22 DIAGNOSIS — Z6827 Body mass index (BMI) 27.0-27.9, adult: Secondary | ICD-10-CM

## 2018-02-22 NOTE — Progress Notes (Signed)
Location:  [facility] clinic Provider:  Garrett Mitchum L. Mariea Clonts, D.O., C.M.D.  Goals of Care:  Advanced Directives 10/28/2017  Does Patient Have a Medical Advance Directive? Yes  Type of Advance Directive [place]  Does patient want to make changes to medical advance directive? No - Patient declined  Copy of [place] in Chart? Yes  Would patient like information on creating a medical advance directive? -  Pre-existing out of facility DNR order (yellow form or pink MOST form) -  Some encounter information is confidential and restricted. Go to Review Flowsheets activity to see all data.     Chief Complaint  Patient presents with  . Medical Management of Chronic Issues    79mth follow-up    HPI: Patient is a 77 y.o. female seen today for medical management of chronic diseases.    She had another fall backwards when having dinner with her family and got caught by her family member.  Has not discussed with PT yet.  Happened again when she went to putting her jacket sleeve on.    Had her cataract surgery and seeing really well.  Thinks her vision will be 20/20.  Has 3 week f/u.  Can see to read and drive w/o glasses.  She went to Dr. Earlean Shawl when she could not get the constipation med I ordered.  She vomited from linzess.  He gave her another one--amitiza.  She's still having to take three scoops of miralax. She sees him again later today.  May need cscope earlier than July.    Weight is stable.  Had been down to 173 before thanksgiving, but did eat a lot then and has continued to eat and now trying to be mindful.  Has only been walking and plans to go back to water aerobics and was cleared to do this with Dr. Ellie Lunch.    Does sleep with current regimen.    Past Medical History:  Diagnosis Date  . 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 Constipation   . Depression   . Fibromyalgia 8/98   Truslow  . 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 Impaired  hearing    left ear, wears hearing aids  . Insomnia    circadian rhythm component  . Insomnia   . Migraine 10/88   Spillman  . Nuclear sclerosis   . OSA on CPAP 2/07   uses cpap setting of 10  . Osteoarthritis 2007   Deveschwar  . Pain last week   left under breast pain   . Plantar fasciitis   . Scarlet fever as child  . Thyroid disorder   . Vitreous degeneration of right eye     Past Surgical History:  Procedure Laterality Date  . BREAST BIOPSY Left 6/00   Hardcastle  . BREAST EXCISIONAL BIOPSY Left 1998  . DE QUERVAIN'S RELEASE Right 10/97   Sypher  . left breast biopsy    . ROOT CANAL  02/11/2012  . TONSILLECTOMY  age 77  . TONSILLECTOMY AND ADENOIDECTOMY  1952  . 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 TOTAL KNEE ARTHROPLASTY Right 11/08/2012   Procedure: RIGHT TOTAL KNEE ARTHROPLASTY;  Surgeon: Gearlean Alf, MD;  Location: [facility];  Service: Orthopedics;  Laterality: Right;  . TUBAL LIGATION      Allergies  Allergen Reactions  . Penicillins Anaphylaxis    Tongue swelling  . Codeine Nausea Only  . Provigil [Modafinil]   . Seroquel [Quetiapine Fumarate]   .  Xyrem [Sodium Oxybate]     hallucination  . Ciprofloxacin Rash  . Monistat [Miconazole] Rash  . Terazol [Terconazole] Rash    Outpatient Encounter Medications as of 02/22/2018  Medication Sig  . AMITIZA 24 MCG capsule TAKE 1 CAPSULE TWICE A DAY WITH MEALS  . aspirin EC 81 MG tablet Take 81 mg by mouth daily.  . Aspirin-Acetaminophen-Caffeine (EXCEDRIN MIGRAINE PO) Take by mouth daily.   Marland Kitchen azelastine (ASTELIN) 0.1 % nasal spray USE 1 SPRAY IN EACH NOSTRIL EVERY DAY  . celecoxib (CELEBREX) 200 MG capsule Take 1 capsule (200 mg total) by mouth daily.  . Cholecalciferol (VITAMIN D3) 5000 units CAPS Take by mouth.  . doxepin (SINEQUAN) 100 MG capsule Take 1 capsule (100 mg total) by mouth at bedtime.  . Eszopiclone 3 MG TABS Take 1 tablet (3 mg total) by mouth at bedtime. Take immediately before bedtime  .  levothyroxine (SYNTHROID, LEVOTHROID) 200 MCG tablet Take 200 mcg by mouth daily before breakfast.  . LORazepam (ATIVAN) 1 MG tablet Take 1 mg by mouth 2 (two) times daily.   . Melatonin 5 MG TABS Take 15 mg by mouth at bedtime.  . montelukast (SINGULAIR) 10 MG tablet Take 10 mg by mouth every evening.  . Multiple Vitamin (MULTIVITAMIN) capsule Take 1 capsule by mouth daily.  . Omega-3 Fatty Acids (FISH OIL) 1000 MG CAPS Take by mouth daily.  . polyethylene glycol (MIRALAX / GLYCOLAX) packet Take 17 g by mouth daily.  . traMADol (ULTRAM) 50 MG tablet Take 1 tablet (50 mg total) by mouth 3 (three) times daily as needed.  . valACYclovir (VALTREX) 1000 MG tablet Take 1,000 mg by mouth as needed.  . vitamin C (ASCORBIC ACID) 500 MG tablet Take 500 mg by mouth daily.  . [DISCONTINUED] linaclotide (LINZESS) 72 MCG capsule Take 1 capsule (72 mcg total) by mouth daily before breakfast.   No facility-administered encounter medications on file as of 02/22/2018.     Review of Systems:  Review of Systems  Constitutional: Positive for malaise/fatigue and weight loss. Negative for chills, diaphoresis and fever.  HENT: Negative for congestion.   Eyes: Negative for blurred vision.       S/p cataract surgery, doing well  Respiratory: Negative for cough and shortness of breath.   Cardiovascular: Negative for chest pain, palpitations and leg swelling.  Gastrointestinal: Positive for constipation. Negative for abdominal pain, blood in stool, diarrhea and melena.  Genitourinary: Negative for dysuria.  Musculoskeletal: Positive for falls, myalgias and neck pain. Negative for joint pain.  Skin: Negative for itching and rash.  Neurological: Positive for headaches. Negative for dizziness, tingling, sensory change, focal weakness, loss of consciousness and weakness.  Psychiatric/Behavioral: Positive for depression. Negative for memory loss. The patient is nervous/anxious and has insomnia.     Health Maintenance   Topic Date Due  . MAMMOGRAM  04/21/2018  . COLONOSCOPY  09/11/2018  . TETANUS/TDAP  05/31/2027  . DEXA SCAN  Completed  . PNA vac Low Risk Adult  Completed    Physical Exam: Vitals:   02/22/18 1011  BP: 110/70  Pulse: 71  SpO2: 94%  Weight: 176 lb (79.8 kg)  Height: 5\' 7"  (1.702 m)   Body mass index is 27.57 kg/m. Physical Exam Constitutional:      General: She is not in acute distress.    Appearance: Normal appearance. She is not toxic-appearing.     Comments: overweight  HENT:     Head: Normocephalic and atraumatic.  Neck:  Musculoskeletal: Muscular tenderness present.  Cardiovascular:     Rate and Rhythm: Normal rate and regular rhythm.     Pulses: Normal pulses.     Heart sounds: Normal heart sounds.  Pulmonary:     Effort: Pulmonary effort is normal.     Breath sounds: Normal breath sounds. No rhonchi.  Abdominal:     General: Bowel sounds are normal. There is no distension.     Palpations: Abdomen is soft.  Musculoskeletal: Normal range of motion.  Skin:    General: Skin is warm and dry.     Capillary Refill: Capillary refill takes less than 2 seconds.     Comments: flushed  Neurological:     General: No focal deficit present.     Mental Status: She is alert and oriented to person, place, and time.     Cranial Nerves: No cranial nerve deficit.     Motor: No weakness.     Coordination: Coordination normal.     Gait: Gait normal.     Deep Tendon Reflexes: Reflexes normal.  Psychiatric:        Mood and Affect: Mood normal.        Behavior: Behavior normal.        Thought Content: Thought content normal.        Judgment: Judgment normal.     Labs reviewed: had labs with endocrine, Dr. Forde Dandy, not all abstracted apparently Basic Metabolic Panel: No results for input(s): NA, K, CL, CO2, GLUCOSE, BUN, CREATININE, CALCIUM, MG, PHOS, TSH in the last 8760 hours. Liver Function Tests: No results for input(s): AST, ALT, ALKPHOS, BILITOT, PROT, ALBUMIN in  the last 8760 hours. No results for input(s): LIPASE, AMYLASE in the last 8760 hours. No results for input(s): AMMONIA in the last 8760 hours. CBC: No results for input(s): WBC, NEUTROABS, HGB, HCT, MCV, PLT in the last 8760 hours. Lipid Panel: No results for input(s): CHOL, HDL, LDLCALC, TRIG, CHOLHDL, LDLDIRECT in the last 8760 hours. Lab Results  Component Value Date   HGBA1C 5.5 08/27/2015    Assessment/Plan 1. Recurrent falls -continues to struggle with falls and near falls backward when she goes to put her jacket/coat on and puts her arm behind her--one fall was quite serious when her posterior ribs struck a corner of a piece of furniture and were fractured -meds definitely contribute but this has been a struggle b/c she has chronic pain and headaches without her current regimen - Ambulatory referral to Physical Therapy for her balance--previously was said to be referred by Dr. Oneida Alar, but I don't see the referral in epic  2. Balance problem -as above, needs therapy - Ambulatory referral to Physical Therapy  3. Degenerative disc disease, cervical -ongoing, cont current pain regimen per Dr. Oneida Alar, was cause of headaches  4. Chronic constipation -now on amitiza and still using three scoops of miralax a day -hydrate, exercise, eat more fruits and veggies with fiber   5. Body mass index 27.0-27.9, adult -bmi has improved some, continue to exercise more and improve diet, decrease wine, cheeses, sweets and starchy carbs/fried items that cause weight gain  Labs/tests ordered:   Orders Placed This Encounter  Procedures  . Ambulatory referral to Physical Therapy    Referral Priority:   Routine    Referral Type:   Physical Medicine    Referral Reason:   Specialty Services Required    Requested Specialty:   Physical Therapy    Number of Visits Requested:   1  Next appt:  04/19/2018   Mikya Don L. Reco Shonk, D.O. [facility] [address]  [place],  [address] Cell Phone (Mon-Fri 8am-5pm):  [phone] On Call:  [phone] & follow prompts after 5pm & weekends Office Phone:  [phone] Office Fax:  [phone]

## 2018-03-01 DIAGNOSIS — R69 Illness, unspecified: Secondary | ICD-10-CM | POA: Diagnosis not present

## 2018-03-04 ENCOUNTER — Ambulatory Visit (INDEPENDENT_AMBULATORY_CARE_PROVIDER_SITE_OTHER): Payer: Medicare HMO | Admitting: Sports Medicine

## 2018-03-04 ENCOUNTER — Encounter: Payer: Self-pay | Admitting: Sports Medicine

## 2018-03-04 VITALS — BP 112/80 | Ht 66.5 in | Wt 175.0 lb

## 2018-03-04 DIAGNOSIS — G43009 Migraine without aura, not intractable, without status migrainosus: Secondary | ICD-10-CM

## 2018-03-04 DIAGNOSIS — M47812 Spondylosis without myelopathy or radiculopathy, cervical region: Secondary | ICD-10-CM | POA: Diagnosis not present

## 2018-03-04 NOTE — Progress Notes (Signed)
    Subjective:  [name] is a 78 y.o. female who presents to the [facility] today with a chief complaint of worsening neck pain and HA over the past month.    HPI:  Patient complaining of worsening neck pain and HA over the past month. Pt has catareact surgery in Dec.  She feels that she had poor neck positioning during her surgery.  This is led to more neck pain.  Which is led to more headaches.  She has continued "neck discomfort, achiness, and increased awareness ".  Denies any radiating pain.  The pain in the neck is bilateral, posterior from the base of the skull to upper back.  She describes the headaches that have a frontotemporal distribution.  Describes it as banding compression.  Patient has to take Excedrin Migraine for the headaches.  She is also been taking Celebrex as needed for neck pain.  Patient regularly takes tramadol 50 mg 3 times daily and doxepin 100 mg nightly for pain.  Patient denies any arm weakness or other neurologic abnormalities.  Objective:  Physical Exam: BP 112/80   Ht 5' 6.5" (1.689 m)   Wt 175 lb (79.4 kg)   LMP 02/25/1995 (Approximate)   BMI 27.82 kg/m   Gen: NAD, well-appearing Inspection: No bruising or gross abnormalities Palpation: Trapezius spasm on patient right ROM: Limited rotation left, flexion, extension.  Right side also limited but to a lesser degree Pain with neck motion and neck flexion is worst Strength: Normal Stability: Stable Special tests: None Vascular studies: None  No results found for this or any previous visit (from the past 72 hour(s)).   Assessment/Plan:  DJD (degenerative joint disease), cervical Patient presenting with worsening chronic cervicalgia also leading to recurrent migraines.  Likely due to poor positioning from patient's recent cataract surgery which led to more aggravated neck pain leading to more frequent headaches.  Patient has had this type of pattern in the past.  Recommend 10-day course of Celebrex in  addition to continuing doxepin and tramadol.  If no improvement after 10 days, recommend increasing doxepin to 50 mg in a.m. and continued 100 mg in the p.m.  Warned patient of sedative side effects from doxepin.  If no improvement in 3 to 4 weeks, patient follow-up.    Lab Orders  No laboratory test(s) ordered today    No orders of the defined types were placed in this encounter.     Marny Lowenstein, MD, MS FAMILY MEDICINE RESIDENT - PGY2 03/04/2018 2:32 PM   I observed and examined the patient with the resident and agree with assessment and plan.  Note reviewed and modified by me. [name], MD

## 2018-03-04 NOTE — Patient Instructions (Addendum)
Take celebrex daily for the next 10 days. Continue to take the doxapin and tramadol as well. Use heat or ice as well for pain. If you continue to have pain after celebrex course, add doxepin 50 mg in AM.

## 2018-03-04 NOTE — Assessment & Plan Note (Signed)
Patient presenting with worsening chronic cervicalgia also leading to recurrent migraines.  Likely due to poor positioning from patient's recent cataract surgery which led to more aggravated neck pain leading to more frequent headaches.  Patient has had this type of pattern in the past.  Recommend 10-day course of Celebrex in addition to continuing doxepin and tramadol.  If no improvement after 10 days, recommend increasing doxepin to 50 mg in a.m. and continued 100 mg in the p.m.  Warned patient of sedative side effects from doxepin.  If no improvement in 3 to 4 weeks, patient follow-up.

## 2018-03-12 DIAGNOSIS — Z961 Presence of intraocular lens: Secondary | ICD-10-CM | POA: Diagnosis not present

## 2018-03-15 ENCOUNTER — Telehealth: Payer: Self-pay

## 2018-03-15 ENCOUNTER — Ambulatory Visit: Payer: Medicare HMO | Admitting: Obstetrics and Gynecology

## 2018-03-15 NOTE — Telephone Encounter (Signed)
Left message to call [place] at [phone].  Patient has an appointment on 03/15/2018 with Dr.Jertson. Need to reschedule as Dr.Jertson will not be in the office today.

## 2018-03-23 DIAGNOSIS — H00015 Hordeolum externum left lower eyelid: Secondary | ICD-10-CM | POA: Diagnosis not present

## 2018-04-01 ENCOUNTER — Ambulatory Visit (INDEPENDENT_AMBULATORY_CARE_PROVIDER_SITE_OTHER): Payer: Medicare HMO | Admitting: Sports Medicine

## 2018-04-01 ENCOUNTER — Encounter: Payer: Self-pay | Admitting: Sports Medicine

## 2018-04-01 DIAGNOSIS — M47812 Spondylosis without myelopathy or radiculopathy, cervical region: Secondary | ICD-10-CM

## 2018-04-01 NOTE — Progress Notes (Signed)
CC: neck stiffness  Patient is having daily headaches and neck stiffness This happens pretty much every morning None have gotten bad enough to trigger a migraine headache She states that the stiffness persists about 1 hour after waking up and goes away within 30 minutes of taking the tramadol She does not get any numbness or tingling into her arms Overall she says by staying on the tramadol 3 times a day and the doxepin 100 mg at bedtime she is the most functional she has been in years She has cut back to using the Celebrex only when needed  Review of systems No cough or sneeze pain No numbness or weakness numbness in her hands  Physical exam Pleasant older lady in no acute distress BP 108/80   Ht 5' 6.5" (1.689 m)   Wt 175 lb (79.4 kg)   LMP 02/25/1995 (Approximate)   BMI 27.82 kg/m   Neurologic evaluation C5-T1 reveals good strength to all testing No numbness noted in either arm Reflexes are 1+ biceps and triceps  Neck range of motion is limited to about 20 degrees of extension Limited to 30 degrees of flexion Lateral bending and rotation are better on the left Lateral bending and rotation are significantly limited on the right and moderately limited on the left

## 2018-04-01 NOTE — Telephone Encounter (Signed)
Patient scheduled for 04/12/2018 for aex with Dr.Jertson. Encounter closed.

## 2018-04-01 NOTE — Assessment & Plan Note (Signed)
Stabel but I thinnk we should try to cut down on her headaches and stiffness  Start home ROM exercises for neck Isometric exercises in 4 planes for neck  No change in medications  Ck 3 mos

## 2018-04-09 DIAGNOSIS — L821 Other seborrheic keratosis: Secondary | ICD-10-CM | POA: Diagnosis not present

## 2018-04-09 DIAGNOSIS — C44319 Basal cell carcinoma of skin of other parts of face: Secondary | ICD-10-CM | POA: Diagnosis not present

## 2018-04-09 DIAGNOSIS — D1801 Hemangioma of skin and subcutaneous tissue: Secondary | ICD-10-CM | POA: Diagnosis not present

## 2018-04-09 DIAGNOSIS — D224 Melanocytic nevi of scalp and neck: Secondary | ICD-10-CM | POA: Diagnosis not present

## 2018-04-09 DIAGNOSIS — D485 Neoplasm of uncertain behavior of skin: Secondary | ICD-10-CM | POA: Diagnosis not present

## 2018-04-09 DIAGNOSIS — L814 Other melanin hyperpigmentation: Secondary | ICD-10-CM | POA: Diagnosis not present

## 2018-04-09 DIAGNOSIS — L718 Other rosacea: Secondary | ICD-10-CM | POA: Diagnosis not present

## 2018-04-12 ENCOUNTER — Ambulatory Visit: Payer: Medicare HMO | Admitting: Obstetrics and Gynecology

## 2018-04-12 DIAGNOSIS — H00015 Hordeolum externum left lower eyelid: Secondary | ICD-10-CM | POA: Diagnosis not present

## 2018-04-12 NOTE — Progress Notes (Deleted)
78 y.o. G0P0 Married White or Caucasian Not Hispanic or Latino female here for annual exam.      Patient's last menstrual period was 02/25/1995 (approximate).          Sexually active: {yes no:314532}  The current method of family planning is {contraception:315051}.    Exercising: {yes no:314532}  {types:19826} Smoker:  {YES NO:22349}  Health Maintenance: Pap:  02-14-16 WNL NEG HR HPV  History of abnormal Pap:  Yes- years ago  MMG:  04/21/2017 Birads 1 negative Colonoscopy:  09-11-15 polyps, due next July  BMD:   01/2017 Osteopenia TDaP:  10-10-10 Gardasil: N/A   reports that she is a non-smoker but has been exposed to tobacco smoke. She has been exposed to tobacco smoke for the past 30.00 years. She has never used smokeless tobacco. She reports current alcohol use. She reports that she does not use drugs.  Past Medical History:  Diagnosis Date  . 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 Constipation   . Depression   . Fibromyalgia 8/98   Truslow  . 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 Impaired hearing    left ear, wears hearing aids  . Insomnia    circadian rhythm component  . Insomnia   . Migraine 10/88   Spillman  . Nuclear sclerosis   . OSA on CPAP 2/07   uses cpap setting of 10  . Osteoarthritis 2007   Deveschwar  . Pain last week   left under breast pain   . Plantar fasciitis   . Scarlet fever as child  . Thyroid disorder   . Vitreous degeneration of right eye     Past Surgical History:  Procedure Laterality Date  . BREAST BIOPSY Left 6/00   Hardcastle  . BREAST EXCISIONAL BIOPSY Left 1998  . DE QUERVAIN'S RELEASE Right 10/97   Sypher  . left breast biopsy    . ROOT CANAL  02/11/2012  . TONSILLECTOMY  age 71  . TONSILLECTOMY AND ADENOIDECTOMY  1952  . 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 TOTAL KNEE ARTHROPLASTY Right 11/08/2012   Procedure: RIGHT TOTAL KNEE ARTHROPLASTY;  Surgeon: Gearlean Alf, MD;  Location: [facility];  Service: Orthopedics;   Laterality: Right;  . TUBAL LIGATION      Current Outpatient Medications  Medication Sig Dispense Refill  . AMITIZA 24 MCG capsule TAKE 1 CAPSULE TWICE A DAY WITH MEALS  3  . aspirin EC 81 MG tablet Take 81 mg by mouth daily.    . Aspirin-Acetaminophen-Caffeine (EXCEDRIN MIGRAINE PO) Take by mouth daily.     Marland Kitchen azelastine (ASTELIN) 0.1 % nasal spray USE 1 SPRAY IN EACH NOSTRIL EVERY DAY  3  . celecoxib (CELEBREX) 200 MG capsule Take 1 capsule (200 mg total) by mouth daily. 90 capsule 0  . Cholecalciferol (VITAMIN D3) 5000 units CAPS Take by mouth.    . doxepin (SINEQUAN) 100 MG capsule Take 1 capsule (100 mg total) by mouth at bedtime. 90 capsule 12  . Eszopiclone 3 MG TABS Take 1 tablet (3 mg total) by mouth at bedtime. Take immediately before bedtime 30 tablet 3  . levothyroxine (SYNTHROID, LEVOTHROID) 200 MCG tablet Take 200 mcg by mouth daily before breakfast.    . LORazepam (ATIVAN) 1 MG tablet Take 1 mg by mouth 2 (two) times daily.     . Melatonin 5 MG TABS Take 15 mg by mouth at bedtime.    . montelukast (SINGULAIR) 10 MG tablet Take 10 mg by  mouth every evening.  3  . Multiple Vitamin (MULTIVITAMIN) capsule Take 1 capsule by mouth daily.    . Omega-3 Fatty Acids (FISH OIL) 1000 MG CAPS Take by mouth daily.    . polyethylene glycol (MIRALAX / GLYCOLAX) packet Take 17 g by mouth daily.    . traMADol (ULTRAM) 50 MG tablet Take 1 tablet (50 mg total) by mouth 3 (three) times daily as needed. 90 tablet 3  . valACYclovir (VALTREX) 1000 MG tablet Take 1,000 mg by mouth as needed.    . vitamin C (ASCORBIC ACID) 500 MG tablet Take 500 mg by mouth daily.     No current facility-administered medications for this visit.     Family History  Problem Relation Age of Onset  . Breast cancer Mother   . Aneurysm Father   . Migraines Father   . Breast cancer Maternal Aunt   . Breast cancer Maternal Aunt   . Breast cancer Maternal Aunt   . Breast cancer Maternal Aunt   . High blood pressure  Brother   . Melanoma Brother   . Kidney cancer Brother        Lesion on kidney  . Heart disease Unknown        Grandmother    Review of Systems  Exam:   LMP 02/25/1995 (Approximate)   Weight change: @WEIGHTCHANGE @ Height:      Ht Readings from Last 3 Encounters:  04/01/18 5' 6.5" (1.689 m)  03/04/18 5' 6.5" (1.689 m)  02/22/18 5\' 7"  (1.702 m)    General appearance: alert, cooperative and appears stated age Head: Normocephalic, without obvious abnormality, atraumatic Neck: no adenopathy, supple, symmetrical, trachea midline and thyroid {CHL AMB PHY EX THYROID NORM DEFAULT:[phone]::"normal to inspection and palpation"} Lungs: clear to auscultation bilaterally Cardiovascular: regular rate and rhythm Breasts: {Exam; breast:13139::"normal appearance, no masses or tenderness"} Abdomen: soft, non-tender; non distended,  no masses,  no organomegaly Extremities: extremities normal, atraumatic, no cyanosis or edema Skin: Skin color, texture, turgor normal. No rashes or lesions Lymph nodes: Cervical, supraclavicular, and axillary nodes normal. No abnormal inguinal nodes palpated Neurologic: Grossly normal   Pelvic: External genitalia:  no lesions              Urethra:  normal appearing urethra with no masses, tenderness or lesions              Bartholins and Skenes: normal                 Vagina: normal appearing vagina with normal color and discharge, no lesions              Cervix: {CHL AMB PHY EX CERVIX NORM [phone]::"no lesions"}               Bimanual Exam:  Uterus:  {CHL AMB PHY EX UTERUS NORM DEFAULT:[phone]::"normal size, contour, position, consistency, mobility, non-tender"}              Adnexa: {CHL AMB PHY EX ADNEXA NO MASS [phone]::"no mass, fullness, tenderness"}               Rectovaginal: Confirms               Anus:  normal sphincter tone, no lesions  Chaperone was present for exam.  A:  Well Woman with normal exam  P:

## 2018-04-14 NOTE — Progress Notes (Signed)
78 y.o. G0P0 Married White or Caucasian Not Hispanic or Latino female here for annual exam.  No vaginal bleeding.  She takes miralax and amitiza for constipation. BM every 1-2 days, still straining.  Not sexually active, too painful.   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 She is on antibiotics, worried about a yeast infection.  She is prone to yeast infections. Only brand name diflucan works for her.  Has allergies to all of the vaginal creams.   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 Has lost 3 close friends recently.   She has occasional GSI, tolerable. Started in the last 6 months. Occurs 1-2 week.     Patient's last menstrual period was 02/25/1995 (approximate).          Sexually active: No.  The current method of family planning is post menopausal status.    Exercising: Yes.    Gym/ health club routine includes swimming. Smoker:  no  Health Maintenance: Pap:  02-14-16 WNL NEG HR HPV  History of abnormal Pap:  Yes- years ago, no surgery on her cervix.   MMG:  04/21/2017 Birads 1 negative Colonoscopy:  09-11-15 polyps, due in July BMD:   01/2017 Osteopenia, followed by endocrinologist. She was on fosamax in the past, she reports damage to her jaw bone. She has TMJ TDaP:  10-10-10 Gardasil: N/A   reports that she is a non-smoker but has been exposed to tobacco smoke. She has been exposed to tobacco smoke for the past 30.00 years. She has never used smokeless tobacco. She reports current alcohol use. She reports that she does not use drugs. One drink a week.   Past Medical History:  Diagnosis Date  . 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 Constipation   . Depression   . Fibromyalgia 8/98   Truslow  . 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 Impaired hearing    left ear, wears hearing aids  . Insomnia    circadian rhythm component  . Insomnia   . Migraine 10/88   Spillman  . Nuclear sclerosis   . OSA on CPAP 2/07   uses cpap setting  of 10  . Osteoarthritis 2007   Deveschwar  . Pain last week   left under breast pain   . Plantar fasciitis   . Scarlet fever as child  . Thyroid disorder   . Vitreous degeneration of right eye     Past Surgical History:  Procedure Laterality Date  . BREAST BIOPSY Left 6/00   Hardcastle  . BREAST EXCISIONAL BIOPSY Left 1998  . DE QUERVAIN'S RELEASE Right 10/97   Sypher  . left breast biopsy    . ROOT CANAL  02/11/2012  . TONSILLECTOMY  age 70  . TONSILLECTOMY AND ADENOIDECTOMY  1952  . 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 TOTAL KNEE ARTHROPLASTY Right 11/08/2012   Procedure: RIGHT TOTAL KNEE ARTHROPLASTY;  Surgeon: Gearlean Alf, MD;  Location: [facility];  Service: Orthopedics;  Laterality: Right;  . TUBAL LIGATION      Current Outpatient Medications  Medication Sig Dispense Refill  . AMITIZA 24 MCG capsule TAKE 1 CAPSULE TWICE A DAY WITH MEALS  3  . aspirin EC 81 MG tablet Take 81 mg by mouth daily.    . Aspirin-Acetaminophen-Caffeine (EXCEDRIN MIGRAINE PO) Take by mouth daily.     Marland Kitchen azelastine (ASTELIN) 0.1 % nasal spray USE 1 SPRAY IN EACH NOSTRIL EVERY DAY  3  . celecoxib (CELEBREX) 200 MG capsule Take 1 capsule (200 mg total) by mouth daily. 90 capsule 0  . Cholecalciferol (VITAMIN D3) 5000 units CAPS Take by mouth.    . doxepin (SINEQUAN) 100 MG capsule Take 1 capsule (100 mg total) by mouth at bedtime. 90 capsule 12  . Eszopiclone 3 MG TABS Take 1 tablet (3 mg total) by mouth at bedtime. Take immediately before bedtime 30 tablet 3  . levothyroxine (SYNTHROID, LEVOTHROID) 200 MCG tablet Take 200 mcg by mouth daily before breakfast.    . LORazepam (ATIVAN) 1 MG tablet Take 1 mg by mouth 2 (two) times daily.     . Melatonin 5 MG TABS Take 15 mg by mouth at bedtime.    . montelukast (SINGULAIR) 10 MG tablet Take 10 mg by mouth every evening.  3  . Multiple Vitamin (MULTIVITAMIN) capsule Take 1 capsule by mouth daily.    . Omega-3 Fatty Acids (FISH OIL) 1000 MG CAPS Take by  mouth daily.    . polyethylene glycol (MIRALAX / GLYCOLAX) packet Take 17 g by mouth daily.    . traMADol (ULTRAM) 50 MG tablet Take 1 tablet (50 mg total) by mouth 3 (three) times daily as needed. 90 tablet 3  . valACYclovir (VALTREX) 1000 MG tablet Take 1,000 mg by mouth as needed.    . vitamin C (ASCORBIC ACID) 500 MG tablet Take 500 mg by mouth daily.     No current facility-administered medications for this visit.     Family History  Problem Relation Age of Onset  . Breast cancer Mother   . Aneurysm Father   . Migraines Father   . Breast cancer Maternal Aunt   . Breast cancer Maternal Aunt   . Breast cancer Maternal Aunt   . Breast cancer Maternal Aunt   . High blood pressure Brother   . Melanoma Brother   . Kidney cancer Brother        Lesion on kidney  . Heart disease Unknown        Grandmother  7 aunts, 4 had breast cancer, mom with breast cancer  Review of Systems  HENT: Positive for sinus pain.   Cardiovascular: Positive for leg swelling.  Endocrine: Positive for cold intolerance and heat intolerance.  Genitourinary:       Loss of urin  Loss of sexual interest   Psychiatric/Behavioral: Positive for dysphoric mood.    Exam:   LMP 02/25/1995 (Approximate)   Weight change: '@WEIGHTCHANGE' @ Height:      Ht Readings from Last 3 Encounters:  04/01/18 5' 6.5" (1.689 m)  03/04/18 5' 6.5" (1.689 m)  02/22/18 '5\' 7"'  (1.702 m)    General appearance: alert, cooperative and appears stated age Head: Normocephalic, without obvious abnormality, atraumatic Neck: no adenopathy, supple, symmetrical, trachea midline and thyroid normal to inspection and palpation Lungs: clear to auscultation bilaterally Cardiovascular: regular rate and rhythm Breasts: normal appearance, no masses or tenderness Abdomen: soft, non-tender; non distended,  no masses,  no organomegaly Extremities: extremities normal, atraumatic, no cyanosis or edema Skin: Skin color, texture, turgor normal. No rashes  or lesions Lymph nodes: Cervical, supraclavicular, and axillary nodes normal. No abnormal inguinal nodes palpated Neurologic: Grossly normal   Pelvic: External genitalia:  no lesions              Urethra:  normal appearing urethra with no masses, tenderness or lesions              Bartholins and Skenes: normal  Vagina: atrophic appearing vagina with normal color and discharge, no lesions              Cervix: no lesions               Bimanual Exam:  Uterus:  normal size, contour, position, consistency, mobility, non-tender              Adnexa: no mass, fullness, tenderness               Rectovaginal: Confirms               Anus:  normal sphincter tone, no lesions  Chaperone was present for exam.  A:  Well Woman with normal exam  Strong FH of breast   New onset GSI  H/O HSV, recent increase in outbreaks  P:   Send urine for ua, c&s  Valtrex, written for daily use, she isn't sure if she will do this  No pap this year, discussed pap guidelines, she will decide if she wants a pap next year  Mammogram scheduled  Referral to genetics, she doesn't want BRCA testing, but is interested to find out her risks. We discussed yearly breast MRI's  DEXA with her Endocrinologist  Colonoscopy being scheduled  (she requested refill for nystatin ointment that she uses prn for yeast in the corner of her mouth)

## 2018-04-15 ENCOUNTER — Other Ambulatory Visit: Payer: Self-pay | Admitting: Obstetrics and Gynecology

## 2018-04-15 DIAGNOSIS — Z1231 Encounter for screening mammogram for malignant neoplasm of breast: Secondary | ICD-10-CM

## 2018-04-19 ENCOUNTER — Encounter: Payer: Self-pay | Admitting: Neurology

## 2018-04-19 ENCOUNTER — Ambulatory Visit: Payer: Medicare Other | Admitting: Internal Medicine

## 2018-04-20 ENCOUNTER — Telehealth: Payer: Self-pay | Admitting: *Deleted

## 2018-04-20 ENCOUNTER — Ambulatory Visit (INDEPENDENT_AMBULATORY_CARE_PROVIDER_SITE_OTHER): Payer: Medicare HMO | Admitting: Obstetrics and Gynecology

## 2018-04-20 ENCOUNTER — Encounter: Payer: Self-pay | Admitting: Obstetrics and Gynecology

## 2018-04-20 VITALS — BP 130/70 | HR 74 | Resp 14 | Ht 66.0 in | Wt 176.8 lb

## 2018-04-20 DIAGNOSIS — N393 Stress incontinence (female) (male): Secondary | ICD-10-CM

## 2018-04-20 DIAGNOSIS — Z792 Long term (current) use of antibiotics: Secondary | ICD-10-CM | POA: Diagnosis not present

## 2018-04-20 DIAGNOSIS — Z803 Family history of malignant neoplasm of breast: Secondary | ICD-10-CM

## 2018-04-20 DIAGNOSIS — Z01419 Encounter for gynecological examination (general) (routine) without abnormal findings: Secondary | ICD-10-CM | POA: Diagnosis not present

## 2018-04-20 MED ORDER — FLUCONAZOLE 150 MG PO TABS: 150.0000 mg | ORAL_TABLET | Freq: Every day | ORAL | 0 refills | Status: DC

## 2018-04-20 MED ORDER — VALACYCLOVIR HCL 500 MG PO TABS: ORAL_TABLET | ORAL | 3 refills | Status: DC

## 2018-04-20 MED ORDER — NYSTATIN 100000 UNIT/GM EX OINT: 1.0000 "application " | TOPICAL_OINTMENT | Freq: Two times a day (BID) | CUTANEOUS | 0 refills | Status: DC

## 2018-04-20 NOTE — Patient Instructions (Addendum)
EXERCISE AND DIET:  We recommended that you start or continue a regular exercise  Kegel Exercises Kegel exercises help strengthen the muscles that support the rectum, vagina, small intestine, bladder, and uterus. Doing Kegel exercises can help:  Improve bladder and bowel control.  Improve sexual response.  Reduce problems and discomfort during pregnancy. Kegel exercises involve squeezing your pelvic floor muscles, which are the same muscles you squeeze when you try to stop the flow of urine. The exercises can be done while sitting, standing, or lying down, but it is best to vary your position. Exercises 1. Squeeze your pelvic floor muscles tight. You should feel a tight lift in your rectal area. If you are a female, you should also feel a tightness in your vaginal area. Keep your stomach, buttocks, and legs relaxed. 2. Hold the muscles tight for up to 10 seconds. 3. Relax your muscles. Repeat this exercise 50 times a day or as many times as told by your health care provider. Continue to do this exercise for at least 4-6 weeks or for as long as told by your health care provider. This information is not intended to replace advice given to you by your health care provider. Make sure you discuss any questions you have with your health care provider. Document Released: 01/28/2012 Document Revised: 06/23/2016 Document Reviewed: 12/31/2014 Elsevier Interactive Patient Education  2019 [employer].

## 2018-04-20 NOTE — Telephone Encounter (Signed)
Call to patient, left detailed message, ok per dpr. Advised I confirmed MMG scheduled for 3/18 is a 3D MMG. Return call to office if any additional questions/concerns.  Routing to provider for final review. Patient is agreeable to disposition. Will close encounter.

## 2018-04-20 NOTE — Telephone Encounter (Signed)
Spoke with Cina at [facility]. Confirmed patient is scheduled for MMG 3D SCREEN BREAST BILATERAL on 05/12/18 at 2:20pm.

## 2018-04-20 NOTE — Telephone Encounter (Signed)
-----   Message from Salvadore Dom, MD sent at 04/20/2018 11:16 AM EST ----- Can you please confirm that this patient's mammogram (scheduled on 3/18) is a 3D mammogram. She understands she may need to pay for the 3d part. It wasn't clear when speaking with the scheduler that she is having a 3D She wants a 3D, please confirm and call the patient.  Thanks, Sharee Pimple

## 2018-04-21 ENCOUNTER — Other Ambulatory Visit: Payer: Self-pay | Admitting: Sports Medicine

## 2018-04-21 ENCOUNTER — Encounter: Payer: Self-pay | Admitting: Neurology

## 2018-04-21 ENCOUNTER — Ambulatory Visit (INDEPENDENT_AMBULATORY_CARE_PROVIDER_SITE_OTHER): Payer: Medicare HMO | Admitting: Neurology

## 2018-04-21 ENCOUNTER — Telehealth: Payer: Self-pay | Admitting: Obstetrics and Gynecology

## 2018-04-21 VITALS — BP 111/74 | HR 75 | Ht 66.0 in | Wt 178.0 lb

## 2018-04-21 DIAGNOSIS — W19XXXD Unspecified fall, subsequent encounter: Secondary | ICD-10-CM

## 2018-04-21 DIAGNOSIS — Z9989 Dependence on other enabling machines and devices: Secondary | ICD-10-CM | POA: Diagnosis not present

## 2018-04-21 DIAGNOSIS — F5103 Paradoxical insomnia: Secondary | ICD-10-CM | POA: Diagnosis not present

## 2018-04-21 DIAGNOSIS — G4733 Obstructive sleep apnea (adult) (pediatric): Secondary | ICD-10-CM

## 2018-04-21 DIAGNOSIS — R69 Illness, unspecified: Secondary | ICD-10-CM | POA: Diagnosis not present

## 2018-04-21 DIAGNOSIS — W19XXXA Unspecified fall, initial encounter: Secondary | ICD-10-CM | POA: Insufficient documentation

## 2018-04-21 LAB — URINE CULTURE

## 2018-04-21 MED ORDER — TRAZODONE HCL 50 MG PO TABS: 25.0000 mg | ORAL_TABLET | Freq: Every day | ORAL | 5 refills | Status: DC

## 2018-04-21 MED ORDER — DOXEPIN HCL 100 MG PO CAPS: 100.0000 mg | ORAL_CAPSULE | Freq: Every day | ORAL | 12 refills | Status: DC

## 2018-04-21 NOTE — Progress Notes (Signed)
SLEEP MEDICINE CLINIC   Provider:  Larey Seat, M D  Primary Care Physician:  Gayland Curry, DO   Referring Provider: Gayland Curry, DO   Chief Complaint  Patient presents with  . Follow-up    pt alone, rm . pt states that she has had some difficulty with her sleep for couple weeks. she has brought her mask in to make sure it is the right fit. [employer] helped her with some adjustments to the machine.     HPI:  [name] is a 78 y.o. female patient , who again has fallen- this time in October 2019 in a restaurant, fell into a piece of furniture, broke several ribs. She had several falls since her right knee replacement. She had cataract surgery and feels her vision is much improved.  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  CT head was negative, lorazepam and lunesta for sleep have been used.  She did harder for her to sleep in spite of the sleep aids she has used for many years, and Dr. Casimiro Needle had asked her to reduce her lorazepam.  She had a specific question about trazodone and I think it is safe to initiate trazodone at a 50 mg tablet and start this taking 1/2 tablet at night.  I also do not think there is a benefit of exceeding 5 mg of melatonin but it may be a placebo effect.  In terms of her CPAP compliance she has been 93% compliant is usually very highly compliant with an average user time of 7 hours and 33 minutes at night this is an AutoSet machine with a pressure window between 4 and 10 cmH2O 2 cm EPR of the 95th percentile pressure is 9.3 cm, she has a residual AHI of 2.3 which speaks for a good resolution there have been some air leaks and we have just try to refit her headgear and mask.  I think that the mask is a good fit especially with the impending Mohs surgery it does not touch the jawline.  Mask if he found the synchrony are a few [facility] background Hours what    Last seen  here for a new concern about a recent fall- retropusive, hit her head on a paved brick pavement, all this happened on June 6th at her husband's high school reunion in [place], [place].  Patient reports that she initially felt not as much pain, she has spit up by herself went to her bedroom and rested, but after 2 or 3 days she developed a headache associated with some sickness to her stomach and some photophobia but must be migrainous in origin.  She did not break any bones, her pupils are of similar size, and she does not have a focal weakness or sensory abnormality. She hasn't had a migraine for almost one year.  She is not dizzy- but walks with sunglasses.  She needs some intervention for her migraine and a CT of the had to rule out any bleed.     In addition I would like to say that she has also used her CPAP compliantly, she has a residual AHI of 2.0 on an AutoSet between 4 and 10 cmH2O was 1 cm EPR, average use of time 7 hours 5 minutes nightly.  80% compliance on primary machine, has another ravel CPAP and the rest of the nights make up 205- together 100%  compliance.     HISTORY: 04-15-2017, I have the pleasure of meeting with [name] today, for a regular CPAP CPAP compliance visit.  The patient now uses an AutoSet between 4 and 10 cmH2O pressure was 1 cm EPR, she has achieved a compliance of 100% with an average use at time of 9 hours and 10 minutes each night.  Her residual AHI is 1.5/h, which is a very good resolution.  95th percentile pressure is 9.7 cm water air leaks are medium. Epworth 2, FSS 41.  She loves the new CPAP auto- machine.  Here is the result of her sleep study:   NAME:  [name]                                                      DOB: [birth year] MEDICAL RECORD NUMBER [id number]                                 DOS: 01/31/2017 REFERRING PHYSICIAN: Hollace Kinnier, D.O. BMI: 30.6  STUDY RESULTS: Total Recording Time: 10 hours, 51 minutes,  Total Apnea/Hypopnea Index  (AHI):   8.2/ hr. RDI 10.5 /hr.  Average Oxygen Saturation:    92 % Lowest Oxygen Desaturation:  82 %; Total Time Oxygen Saturation below 89%:  19 minutes Average Heart Rate:   64 bpm (40-161 bpm).  IMPRESSION: Mild sleep apnea with snoring, desaturation and heart rate variability = AHI 8.2/hr . Continued CPAP therapy is indicated.  I ordered  Auto CPAP 4-10 cm water, 3 cm EPR, heated humidity and fit for dream wear interface.    Larey Seat, MD     Epworth score  2 , Fatigue severity score 41    Social History   Socioeconomic History  . Marital status: Married    Spouse name: [name]  . Number of children: 0  . Years of education: 84  . Highest education level: Not on file  Occupational History  . Not on file  Social Needs  . Financial resource strain: Not hard at all  . Food insecurity:    Worry: Never true    Inability: Never true  . Transportation needs:    Medical: No    Non-medical: No  Tobacco Use  . Smoking status: Passive Smoke Exposure - Never Smoker  . Smokeless tobacco: Never Used  . 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 Alcohol use: Yes    Alcohol/week: 0.0 standard drinks    Comment: 1 glass - occas.  . Drug use: No  . Sexual activity: Not Currently    Partners: Male    Birth control/protection: Post-menopausal  Lifestyle  . Physical activity:    Days per week: 4 days    Minutes per session: 60 min  . Stress: Only a little  Relationships  . Social connections:    Talks on phone: More than three times a week    Gets together: More than three times a week    Attends religious service: More than 4 times per year    Active member of club or organization: No    Attends meetings of clubs or organizations: Never    Relationship status: Married  . Intimate partner violence:    Fear  of current or ex partner: No    Emotionally abused: No    Physically abused: No    Forced sexual activity: No  Other Topics  Concern  . Not on file  Social History Narrative   Patient is married [name]).   Patient drinks 2-4 cups of coffee daily.   Patient is retired.   Patient has two years of college.   Patient is right-handed.                Family History  Problem Relation Age of Onset  . Breast cancer Mother   . Aneurysm Father   . Migraines Father   . Breast cancer Maternal Aunt   . Breast cancer Maternal Aunt   . Breast cancer Maternal Aunt   . Breast cancer Maternal Aunt   . High blood pressure Brother   . Melanoma Brother   . Kidney cancer Brother        Lesion on kidney  . Heart disease Other        Grandmother    Past Medical History:  Diagnosis Date  . Cancer ([place])    basal cell skin biopsies X2  . 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 Constipation   . Depression   . Fibromyalgia 8/98   Truslow  . 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 Impaired hearing    left ear, wears hearing aids  . Insomnia    circadian rhythm component  . Insomnia   . Migraine 10/88   Spillman  . Nuclear sclerosis   . OSA on CPAP 2/07   uses cpap setting of 10  . Osteoarthritis 2007   Deveschwar  . Pain last week   left under breast pain   . Plantar fasciitis   . Scarlet fever as child  . Thyroid disorder   . Vitreous degeneration of right eye     Past Surgical History:  Procedure Laterality Date  . BREAST BIOPSY Left 6/00   Hardcastle  . BREAST EXCISIONAL BIOPSY Left 1998  . DE QUERVAIN'S RELEASE Right 10/97   Sypher  . left breast biopsy    . ROOT CANAL  02/11/2012  . TONSILLECTOMY  age 56  . TONSILLECTOMY AND ADENOIDECTOMY  1952  . 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 TOTAL KNEE ARTHROPLASTY Right 11/08/2012   Procedure: RIGHT TOTAL KNEE ARTHROPLASTY;  Surgeon: Gearlean Alf, MD;  Location: [facility];  Service: Orthopedics;  Laterality: Right;  . TUBAL LIGATION      Current Outpatient Medications  Medication Sig Dispense Refill  . AMITIZA 24 MCG capsule TAKE 1  CAPSULE TWICE A DAY WITH MEALS  3  . aspirin EC 81 MG tablet Take 81 mg by mouth daily.    . Aspirin-Acetaminophen-Caffeine (EXCEDRIN MIGRAINE PO) Take by mouth daily.     Marland Kitchen azelastine (ASTELIN) 0.1 % nasal spray USE 1 SPRAY IN EACH NOSTRIL EVERY DAY  3  . celecoxib (CELEBREX) 200 MG capsule Take 1 capsule (200 mg total) by mouth daily. 90 capsule 0  . Cholecalciferol (VITAMIN D3) 5000 units CAPS Take by mouth.    . doxepin (SINEQUAN) 100 MG capsule Take 1 capsule (100 mg total) by mouth at bedtime. 90 capsule 12  . doxycycline (VIBRAMYCIN) 100 MG capsule Take 100 mg by mouth 2 (two) times daily.    . DUREZOL 0.05 % EMUL PLACE 1 DROP INTO RIGHT EYE 4 TIMES A DAY AS DIRECTED AFTER SURGERY    . Eszopiclone 3 MG  TABS Take 1 tablet (3 mg total) by mouth at bedtime. Take immediately before bedtime 30 tablet 3  . fluconazole (DIFLUCAN) 150 MG tablet Take 1 tablet (150 mg total) by mouth daily. Take one tablet, repeat x 1 if needed in 72 hours. 2 tablet 0  . levothyroxine (SYNTHROID, LEVOTHROID) 200 MCG tablet Take 200 mcg by mouth daily before breakfast.    . LORazepam (ATIVAN) 1 MG tablet Take 1 mg by mouth 2 (two) times daily.     . Melatonin 5 MG TABS Take 20 mg by mouth at bedtime.     . montelukast (SINGULAIR) 10 MG tablet Take 10 mg by mouth every evening.  3  . Multiple Vitamin (MULTIVITAMIN) capsule Take 1 capsule by mouth daily.    Marland Kitchen nystatin ointment (MYCOSTATIN) Apply 1 application topically 2 (two) times daily. Apply to affected area for up to 7 days. 30 g 0  . Omega-3 Fatty Acids (FISH OIL) 1000 MG CAPS Take by mouth daily.    . polyethylene glycol (MIRALAX / GLYCOLAX) packet Take 17 g by mouth daily.    . traMADol (ULTRAM) 50 MG tablet Take 1 tablet (50 mg total) by mouth 3 (three) times daily as needed. 90 tablet 3  . valACYclovir (VALTREX) 1000 MG tablet Take 1,000 mg by mouth as needed.    . valACYclovir (VALTREX) 500 MG tablet One tablet po qd, increase to one tablet BID x 3 days as  needed. 90 tablet 3  . vitamin C (ASCORBIC ACID) 500 MG tablet Take 500 mg by mouth daily.     No current facility-administered medications for this visit.     Allergies as of 04/21/2018 - Review Complete 04/21/2018  Allergen Reaction Noted  . Penicillins Anaphylaxis 04/29/2011  . Codeine Nausea Only 04/29/2011  . Provigil [modafinil]  03/24/2013  . Seroquel [quetiapine fumarate]  07/20/2014  . Xyrem [sodium oxybate]  06/18/2011  . Ciprofloxacin Rash 07/01/2012  . Monistat [miconazole] Rash 07/01/2012  . Terazol [terconazole] Rash 07/01/2012    Vitals: BP 111/74   Pulse 75   Ht 5\' 6"  (1.676 m)   Wt 178 lb (80.7 kg)   LMP 02/25/1995 (Approximate)   BMI 28.73 kg/m  Last Weight:  Wt Readings from Last 1 Encounters:  04/21/18 178 lb (80.7 kg)   ZDG:UYQI mass index is 28.73 kg/m.     Last Height:   Ht Readings from Last 1 Encounters:  04/21/18 5\' 6"  (1.676 m)    Physical exam:  General: The patient is awake, alert and appears not in acute distress. The patient is well groomed. Head: Normocephalic, atraumatic. Neck is supple. Mallampati 4  neck circumference:15.5 . Nasal airflow patent.  Cardiovascular:  Regular rate and rhythm , without  murmurs or carotid bruit, and without distended neck veins. Respiratory: Lungs are clear to auscultation. Skin:  Without evidence of edema, or rash Trunk: BMI is  30.18- .   Neurologic exam : The patient is awake and alert, oriented to place and time.   Memory subjective described as intact.  Memory testing revealed   MOCA:No flowsheet data found. MMSE: MMSE - Mini Mental State Exam 10/28/2017 09/23/2016 08/31/2015  Orientation to time 5 5 4   Orientation to Place 5 5 5   Registration 3 3 3   Attention/ Calculation 5 5 4   Recall 2 2 3   Language- name 2 objects 2 2 2   Language- repeat 1 1 1   Language- follow 3 step command 3 3 1   Language- read & follow direction  1 1 1   Write a sentence 1 1 1   Copy design 1 1 1   Total score 29 29 26          Attention span & concentration ability appears normal.  Speech is fluent, Mood and affect are appropriate.  Cranial nerves: Pupils are equal, movements  in vertical and horizontal planes intact and without nystagmus.  Visual fields by finger perimetry are intact. Hearing to finger rub intact.  Facial sensation intact to fine touch. Facial motor strength is symmetric and tongue and uvula move midline. Shoulder shrug was symmetrical. Motor exam:  Normal tone, muscle bulk and symmetric strength in all extremities. Sensory:  Fine touch, pinprick and vibration were tested in all extremities.  Coordination: Finger-to-nose maneuver  normal without evidence of ataxia, dysmetria or tremor. Gait and station: Patient walks without assistive device . Deep tendon reflexes: in the  upper and lower extremities are symmetric and intact. Babinski maneuver response is downgoing.  Assessment:  After physical and neurologic examination, review of laboratory studies,  Personal review of imaging studies, reports of other /same  Imaging studies, results of polysomnography and / or neurophysiology testing and pre-existing records as far as provided in visit., my assessment is:   1) OSA treated with CPAP, compliant.   2) insomnia, more related to anxiety and depression. Will start trazodone, and asked to patient to not to use lunesta.   3) posttraumatic headaches have improved.    Plan:  Treatment plan and additional workup : continue using CPAP.   Trazodone 50 mg. Start with 25 mg first.     Larey Seat, MD 1/61/0960, 4:54 PM  Certified in Neurology by ABPN Certified in [place] by [facility] [address], [place], [place] [address]

## 2018-04-21 NOTE — Telephone Encounter (Signed)
Call to patient.  She states she was only given one [facility] from [facility].  Advised the order was sent for two and that there should be another pill available for [facility] to [facility] at [facility].  She is paying cash for branded tablets. [facility] will call [facility] and call back if any further concerns.

## 2018-04-21 NOTE — Patient Instructions (Signed)
Trazodone tablets What is this medicine? TRAZODONE (TRAZ oh done) is used to treat depression. This medicine may be used for other purposes; ask your health care provider or pharmacist if you have questions. COMMON BRAND NAME(S): Desyrel What should I tell my health care provider before I take this medicine? They need to know if you have any of these conditions: -attempted suicide or thinking about it -bipolar disorder -bleeding problems -glaucoma -heart disease, or previous heart attack -irregular heart beat -kidney or liver disease -low levels of sodium in the blood -an unusual or allergic reaction to trazodone, other medicines, foods, dyes or preservatives -pregnant or trying to get pregnant -breast-feeding How should I use this medicine? Take this medicine by mouth with a glass of water. Follow the directions on the prescription label. Take this medicine shortly after a meal or a light snack. Take your medicine at regular intervals. Do not take your medicine more often than directed. Do not stop taking this medicine suddenly except upon the advice of your doctor. Stopping this medicine too quickly may cause serious side effects or your condition may worsen. A special MedGuide will be given to you by the pharmacist with each prescription and refill. Be sure to read this information carefully each time. Talk to your pediatrician regarding the use of this medicine in children. Special care may be needed. Overdosage: If you think you have taken too much of this medicine contact a poison control center or emergency room at once. NOTE: This medicine is only for you. Do not share this medicine with others. What if I miss a dose? If you miss a dose, take it as soon as you can. If it is almost time for your next dose, take only that dose. Do not take double or extra doses. What may interact with this medicine? Do not take this medicine with any of the following medications: -certain medicines  for fungal infections like fluconazole, itraconazole, ketoconazole, posaconazole, voriconazole -cisapride -dofetilide -dronedarone -linezolid -MAOIs like Carbex, Eldepryl, Marplan, Nardil, and Parnate -mesoridazine -methylene blue (injected into a vein) -pimozide -saquinavir -thioridazine This medicine may also interact with the following medications: -alcohol -antiviral medicines for HIV or AIDS -aspirin and aspirin-like medicines -barbiturates like phenobarbital -certain medicines for blood pressure, heart disease, irregular heart beat -certain medicines for depression, anxiety, or psychotic disturbances -certain medicines for migraine headache like almotriptan, eletriptan, frovatriptan, naratriptan, rizatriptan, sumatriptan, zolmitriptan -certain medicines for seizures like carbamazepine and phenytoin -certain medicines for sleep -certain medicines that treat or prevent blood clots like dalteparin, enoxaparin, warfarin -digoxin -fentanyl -lithium -NSAIDS, medicines for pain and inflammation, like ibuprofen or naproxen -other medicines that prolong the QT interval (cause an abnormal heart rhythm) -rasagiline -supplements like St. John's wort, kava kava, valerian -tramadol -tryptophan This list may not describe all possible interactions. Give your health care provider a list of all the medicines, herbs, non-prescription drugs, or dietary supplements you use. Also tell them if you smoke, drink alcohol, or use illegal drugs. Some items may interact with your medicine. What should I watch for while using this medicine? Tell your doctor if your symptoms do not get better or if they get worse. Visit your doctor or health care professional for regular checks on your progress. Because it may take several weeks to see the full effects of this medicine, it is important to continue your treatment as prescribed by your doctor. Patients and their families should watch out for new or worsening  thoughts of suicide or depression. Also watch   out for sudden changes in feelings such as feeling anxious, agitated, panicky, irritable, hostile, aggressive, impulsive, severely restless, overly excited and hyperactive, or not being able to sleep. If this happens, especially at the beginning of treatment or after a change in dose, call your health care professional. You may get drowsy or dizzy. Do not drive, use machinery, or do anything that needs mental alertness until you know how this medicine affects you. Do not stand or sit up quickly, especially if you are an older patient. This reduces the risk of dizzy or fainting spells. Alcohol may interfere with the effect of this medicine. Avoid alcoholic drinks. This medicine may cause dry eyes and blurred vision. If you wear contact lenses you may feel some discomfort. Lubricating drops may help. See your eye doctor if the problem does not go away or is severe. Your mouth may get dry. Chewing sugarless gum, sucking hard candy and drinking plenty of water may help. Contact your doctor if the problem does not go away or is severe. What side effects may I notice from receiving this medicine? Side effects that you should report to your doctor or health care professional as soon as possible: -allergic reactions like skin rash, itching or hives, swelling of the face, lips, or tongue -elevated mood, decreased need for sleep, racing thoughts, impulsive behavior -confusion -fast, irregular heartbeat -feeling faint or lightheaded, falls -feeling agitated, angry, or irritable -loss of balance or coordination -painful or prolonged erections -restlessness, pacing, inability to keep still -suicidal thoughts or other mood changes -tremors -trouble sleeping -seizures -unusual bleeding or bruising Side effects that usually do not require medical attention (report to your doctor or health care professional if they continue or are bothersome): -change in sex drive or  performance -change in appetite or weight -constipation -headache -muscle aches or pains -nausea This list may not describe all possible side effects. Call your doctor for medical advice about side effects. You may report side effects to FDA at 1-800-FDA-1088. Where should I keep my medicine? Keep out of the reach of children. Store at room temperature between 15 and 30 degrees C (59 to 86 degrees F). Protect from light. Keep container tightly closed. Throw away any unused medicine after the expiration date. NOTE: This sheet is a summary. It may not cover all possible information. If you have questions about this medicine, talk to your doctor, pharmacist, or health care provider.  2019 Elsevier/Gold Standard (2017-04-21 17:51:24)  

## 2018-04-21 NOTE — Telephone Encounter (Signed)
Patient took diflucan she filled prescription for and woke up with a yeast infection. Would like another diflucan called to [facility] on [address] at [phone]. Name brand only.

## 2018-04-22 LAB — URINALYSIS, MICROSCOPIC ONLY: Casts: NONE SEEN /lpf

## 2018-04-27 NOTE — Progress Notes (Signed)
GYNECOLOGY  VISIT   HPI: 78 y.o.   Married White or Caucasian Not Hispanic or Latino  female   G0P0 with Patient's last menstrual period was 02/25/1995 (approximate).   here for   Yeast infection  The patient was seen a couple of weeks ago for an annual exam. She was on antibiotics at the time and was worried about yeast. She requested diflucan (brand name) which was sent in for her. She started getting symptoms shortly after her visit her. She has had severe itching, yellow vaginal discharge. She took both diflucan, the last one was a week ago. She is feeling much better, still with mild itching, no current discharge. She just got put on another course of antibiotics (will start it next week) and is worried she is going to get another infection.   GYNECOLOGIC HISTORY: Patient's last menstrual period was 02/25/1995 (approximate). Contraception: Post menopausal  Menopausal hormone therapy: none        OB History    Gravida  0   Para      Term      Preterm      AB      Living        SAB      TAB      Ectopic      Multiple      Live Births                 Patient Active Problem List   Diagnosis Date Noted  . Fall 04/21/2018  . 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 Diverticulosis 12/23/2017  . History of adenomatous polyp of colon 12/23/2017  . Recurrent falls 12/14/2017  . Degenerative disc disease, cervical 12/14/2017  . Chronic constipation 12/14/2017  . Migraine with aura and with status migrainosus, not intractable 08/10/2017  . Floaters in visual field, bilateral 08/10/2017  . Head injury consultation 08/10/2017  . Arthritis of carpometacarpal [facility]) joint of right thumb 05/14/2017  . Therapeutic opioid-induced constipation (OIC) 02/05/2017  . Overweight (BMI 25.0-29.9) 02/05/2017  . Dry mouth 02/05/2017  . 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 Intolerance of continuous positive airway pressure (CPAP) ventilation 01/08/2017  .  History of total knee arthroplasty, 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  . Cough 03/06/2016  . At risk for injury related to fall 02/14/2016  . Primary osteoarthritis of both hands 02/04/2016  . Osteopenia of multiple sites 02/04/2016  . Metatarsalgia of both feet 02/04/2016  . Migraines 02/04/2016  . Other fatigue 02/01/2016  . Melena 08/20/2015  . Acute recurrent maxillary sinusitis 08/20/2015  . Slow transit constipation 08/20/2015  . Vitamin D deficiency 08/20/2015  . Thyroid activity decreased 08/20/2015  . Cephalalgia 08/20/2015  . Preop cardiovascular exam 07/20/2015  . Subacute ethmoidal sinusitis 12/11/2014  . Chronic infection of sinus 07/12/2014  . Nasal septal ulcer 05/25/2014  . 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 Deflected nasal septum 01/30/2014  . Hypertrophy of nasal turbinates 01/30/2014  . 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 Insomnia secondary to depression with anxiety 10/04/2013  . Exertional dyspnea 08/20/2013  . History of rheumatic fever 08/20/2013  . Hypomania (mild) single episode or unspecified 08/09/2013  . OSA on CPAP 04/06/2013  . Postoperative anemia due to acute blood loss 11/09/2012  . OA (osteoarthritis) of knee 11/08/2012  . History of giardia infection 07/15/2012  . Atrophic vaginitis 07/15/2012  . Vitreous degeneration of right eye   . Nuclear sclerosis   . Right shoulder pain 06/18/2011  .  Foot pain, left 06/18/2011    Past Medical History:  Diagnosis Date  . Cancer ([place])    basal cell skin biopsies X2  . 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 Constipation   . Depression   . Fibromyalgia 8/98   [place]  . 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 Impaired hearing    left ear, wears hearing aids  . Insomnia    circadian rhythm component  . Insomnia   . Migraine 10/88   [place]  . Nuclear sclerosis   . OSA on CPAP 2/07   uses cpap setting of 10  . Osteoarthritis 2007   [place]  . Pain last  week   left under breast pain   . Plantar fasciitis   . Scarlet fever as child  . Thyroid disorder   . Vitreous degeneration of right eye     Past Surgical History:  Procedure Laterality Date  . BREAST BIOPSY Left 6/00   [place]  . BREAST EXCISIONAL BIOPSY Left 1998  . DE QUERVAIN'S RELEASE Right 10/97   [place]  . left breast biopsy    . ROOT CANAL  02/11/2012  . TONSILLECTOMY  age 62  . TONSILLECTOMY AND ADENOIDECTOMY  1952  . 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 TOTAL KNEE ARTHROPLASTY Right 11/08/2012   Procedure: RIGHT TOTAL KNEE ARTHROPLASTY;  Surgeon: Gearlean Alf, MD;  Location: [facility];  Service: Orthopedics;  Laterality: Right;  . TUBAL LIGATION      Current Outpatient Medications  Medication Sig Dispense Refill  . AMITIZA 24 MCG capsule TAKE 1 CAPSULE TWICE A DAY WITH MEALS  3  . aspirin EC 81 MG tablet Take 81 mg by mouth daily.    . Aspirin-Acetaminophen-Caffeine (EXCEDRIN MIGRAINE PO) Take by mouth daily.     Marland Kitchen azelastine (ASTELIN) 0.1 % nasal spray USE 1 SPRAY IN EACH NOSTRIL EVERY DAY  3  . celecoxib (CELEBREX) 200 MG capsule Take 1 capsule (200 mg total) by mouth daily. 90 capsule 0  . Cholecalciferol (VITAMIN D3) 5000 units CAPS Take by mouth.    . doxepin (SINEQUAN) 100 MG capsule Take 1 capsule (100 mg total) by mouth at bedtime. 90 capsule 12  . DUREZOL 0.05 % EMUL PLACE 1 DROP INTO RIGHT EYE 4 TIMES A DAY AS DIRECTED AFTER SURGERY    . Eszopiclone 3 MG TABS Take 1 tablet (3 mg total) by mouth at bedtime. Take immediately before bedtime 30 tablet 3  . levothyroxine (SYNTHROID, LEVOTHROID) 200 MCG tablet Take 200 mcg by mouth daily before breakfast.    . LORazepam (ATIVAN) 1 MG tablet Take 1 mg by mouth 2 (two) times daily.     . Melatonin 5 MG TABS Take 20 mg by mouth at bedtime.     . montelukast (SINGULAIR) 10 MG tablet Take 10 mg by mouth every evening.  3  . Multiple Vitamin (MULTIVITAMIN) capsule Take 1 capsule by mouth daily.    Marland Kitchen nystatin  ointment (MYCOSTATIN) Apply 1 application topically 2 (two) times daily. Apply to affected area for up to 7 days. 30 g 0  . Omega-3 Fatty Acids (FISH OIL) 1000 MG CAPS Take by mouth daily.    . polyethylene glycol (MIRALAX / GLYCOLAX) packet Take 17 g by mouth daily.    . traMADol (ULTRAM) 50 MG tablet Take 1 tablet (50 mg total) by mouth 3 (three) times daily as needed. 90 tablet 3  . traZODone (DESYREL) 50 MG tablet Take 0.5-1 tablets (  25-50 mg total) by mouth at bedtime. 30 tablet 5  . valACYclovir (VALTREX) 1000 MG tablet Take 1,000 mg by mouth as needed.    . valACYclovir (VALTREX) 500 MG tablet One tablet po qd, increase to one tablet BID x 3 days as needed. 90 tablet 3  . vitamin C (ASCORBIC ACID) 500 MG tablet Take 500 mg by mouth daily.     No current facility-administered medications for this visit.      ALLERGIES: Penicillins; Codeine; Provigil [modafinil]; Seroquel [quetiapine fumarate]; Xyrem [sodium oxybate]; Ciprofloxacin; Monistat [miconazole]; and Terazol [terconazole]  Family History  Problem Relation Age of Onset  . Breast cancer Mother   . Aneurysm Father   . Migraines Father   . Breast cancer Maternal Aunt   . Breast cancer Maternal Aunt   . Breast cancer Maternal Aunt   . Breast cancer Maternal Aunt   . High blood pressure Brother   . Melanoma Brother   . Kidney cancer Brother        Lesion on kidney  . Heart disease Other        Grandmother    Social History   Socioeconomic History  . Marital status: Married    Spouse name: [name]  . Number of children: 0  . Years of education: 55  . Highest education level: Not on file  Occupational History  . Not on file  Social Needs  . Financial resource strain: Not hard at all  . Food insecurity:    Worry: Never true    Inability: Never true  . Transportation needs:    Medical: No    Non-medical: No  Tobacco Use  . Smoking status: Passive Smoke Exposure - Never Smoker  . Smokeless tobacco: Never Used  .  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 Alcohol use: Yes    Alcohol/week: 0.0 standard drinks    Comment: 1 glass - occas.  . Drug use: No  . Sexual activity: Not Currently    Partners: Male    Birth control/protection: Post-menopausal  Lifestyle  . Physical activity:    Days per week: 4 days    Minutes per session: 60 min  . Stress: Only a little  Relationships  . Social connections:    Talks on phone: More than three times a week    Gets together: More than three times a week    Attends religious service: More than 4 times per year    Active member of club or organization: No    Attends meetings of clubs or organizations: Never    Relationship status: Married  . Intimate partner violence:    Fear of current or ex partner: No    Emotionally abused: No    Physically abused: No    Forced sexual activity: No  Other Topics Concern  . Not on file  Social History Narrative   Patient is married [name]).   Patient drinks 2-4 cups of coffee daily.   Patient is retired.   Patient has two years of college.   Patient is right-handed.                Review of Systems  Constitutional: Negative.   HENT: Negative.   Eyes: Negative.   Respiratory: Negative.   Cardiovascular: Negative.   Gastrointestinal: Negative.   Genitourinary:       Vaginal and Vulvar itching  Musculoskeletal: Negative.   Skin: Negative.   Neurological: Negative.  Endo/Heme/Allergies: Negative.   Psychiatric/Behavioral: Negative.     PHYSICAL EXAMINATION:    BP 118/64 (BP Location: Right Arm, Patient Position: Sitting, Cuff Size: Large)   Pulse 76   Resp 14   Ht 5\' 6"  (1.676 m)   Wt 176 lb (79.8 kg)   LMP 02/25/1995 (Approximate)   BMI 28.41 kg/m     General appearance: alert, cooperative and appears stated age  Pelvic: External genitalia:  no lesions, erythema, some excoriation on the right labia majora.               Urethra:  normal appearing  urethra with no masses, tenderness or lesions              Bartholins and Skenes: normal                 Vagina: atrophic appearing vagina, no discharge, no lesions              Cervix: no lesions               Chaperone was present for exam.  ASSESSMENT Acute vulvitis, self treated for yeast after course of antibiotics. Her vulva is irritated, improving (per patient). No vaginal d/c noted She has a h/o recurrent yeast, is going to restart antibiotics next week and requests diflucan in case she needs it.     PLAN Nuswab for candida Valisone ointment for vulvitis Script of diflucan given for use if needed    An After Visit Summary was printed and given to the patient.

## 2018-04-29 DIAGNOSIS — G4733 Obstructive sleep apnea (adult) (pediatric): Secondary | ICD-10-CM | POA: Diagnosis not present

## 2018-04-30 ENCOUNTER — Ambulatory Visit (INDEPENDENT_AMBULATORY_CARE_PROVIDER_SITE_OTHER): Payer: Medicare HMO | Admitting: Obstetrics and Gynecology

## 2018-04-30 ENCOUNTER — Telehealth: Payer: Self-pay | Admitting: Obstetrics and Gynecology

## 2018-04-30 ENCOUNTER — Encounter: Payer: Self-pay | Admitting: Obstetrics and Gynecology

## 2018-04-30 VITALS — BP 118/64 | HR 76 | Resp 14 | Ht 66.0 in | Wt 176.0 lb

## 2018-04-30 DIAGNOSIS — N762 Acute vulvitis: Secondary | ICD-10-CM

## 2018-04-30 DIAGNOSIS — Z8742 Personal history of other diseases of the female genital tract: Secondary | ICD-10-CM | POA: Diagnosis not present

## 2018-04-30 MED ORDER — FLUCONAZOLE 150 MG PO TABS: 150.0000 mg | ORAL_TABLET | Freq: Every day | ORAL | 0 refills | Status: DC

## 2018-04-30 MED ORDER — BETAMETHASONE VALERATE 0.1 % EX OINT: 1.0000 "application " | TOPICAL_OINTMENT | Freq: Two times a day (BID) | CUTANEOUS | 0 refills | Status: DC

## 2018-04-30 NOTE — Telephone Encounter (Signed)
Patient was seen earlier today and then called requesting to know whether she can swim in the pool with her current condition. She said to please leave the answer on her voice mail for her.

## 2018-04-30 NOTE — Telephone Encounter (Signed)
Spoke with patient, advised can swim in pool would not recommend hot tub. Recommended removing bathing suit or wet clothing as soon as possible, keep clean and dry. Advised Dr. Talbert Nan will review, our office will return call if any additional recommendations.  Routing to provider for final review. Patient is agreeable to disposition. Will close encounter.

## 2018-05-04 DIAGNOSIS — Z85828 Personal history of other malignant neoplasm of skin: Secondary | ICD-10-CM | POA: Diagnosis not present

## 2018-05-04 DIAGNOSIS — C44319 Basal cell carcinoma of skin of other parts of face: Secondary | ICD-10-CM | POA: Diagnosis not present

## 2018-05-04 LAB — C ALBICANS + C GLABRATA, NAA
Candida albicans, NAA: NEGATIVE
Candida glabrata, NAA: NEGATIVE

## 2018-05-12 ENCOUNTER — Other Ambulatory Visit: Payer: Self-pay

## 2018-05-12 ENCOUNTER — Ambulatory Visit
Admission: RE | Admit: 2018-05-12 | Discharge: 2018-05-12 | Disposition: A | Discharge status: Home / Self Care | Payer: Medicare HMO | Source: Ambulatory Visit | Attending: Obstetrics and Gynecology | Admitting: Obstetrics and Gynecology

## 2018-05-12 DIAGNOSIS — Z1231 Encounter for screening mammogram for malignant neoplasm of breast: Secondary | ICD-10-CM

## 2018-05-13 ENCOUNTER — Other Ambulatory Visit: Payer: Self-pay | Admitting: Neurology

## 2018-06-01 DIAGNOSIS — E038 Other specified hypothyroidism: Secondary | ICD-10-CM | POA: Diagnosis not present

## 2018-06-01 DIAGNOSIS — E785 Hyperlipidemia, unspecified: Secondary | ICD-10-CM | POA: Diagnosis not present

## 2018-06-01 DIAGNOSIS — Z1331 Encounter for screening for depression: Secondary | ICD-10-CM | POA: Diagnosis not present

## 2018-06-01 DIAGNOSIS — D126 Benign neoplasm of colon, unspecified: Secondary | ICD-10-CM | POA: Diagnosis not present

## 2018-06-01 DIAGNOSIS — M8580 Other specified disorders of bone density and structure, unspecified site: Secondary | ICD-10-CM | POA: Diagnosis not present

## 2018-06-01 DIAGNOSIS — K5904 Chronic idiopathic constipation: Secondary | ICD-10-CM | POA: Diagnosis not present

## 2018-06-01 DIAGNOSIS — E559 Vitamin D deficiency, unspecified: Secondary | ICD-10-CM | POA: Diagnosis not present

## 2018-06-01 DIAGNOSIS — R296 Repeated falls: Secondary | ICD-10-CM | POA: Diagnosis not present

## 2018-06-01 DIAGNOSIS — G4733 Obstructive sleep apnea (adult) (pediatric): Secondary | ICD-10-CM | POA: Diagnosis not present

## 2018-06-17 ENCOUNTER — Other Ambulatory Visit: Payer: Self-pay | Admitting: Sports Medicine

## 2018-06-17 MED ORDER — TRAMADOL HCL 50 MG PO TABS: 50.0000 mg | ORAL_TABLET | Freq: Three times a day (TID) | ORAL | 3 refills | Status: DC | PRN

## 2018-06-25 DIAGNOSIS — F419 Anxiety disorder, unspecified: Secondary | ICD-10-CM | POA: Diagnosis not present

## 2018-06-25 DIAGNOSIS — J309 Allergic rhinitis, unspecified: Secondary | ICD-10-CM | POA: Diagnosis not present

## 2018-06-25 DIAGNOSIS — G4700 Insomnia, unspecified: Secondary | ICD-10-CM | POA: Diagnosis not present

## 2018-06-25 DIAGNOSIS — K5900 Constipation, unspecified: Secondary | ICD-10-CM | POA: Diagnosis not present

## 2018-06-25 DIAGNOSIS — G4730 Sleep apnea, unspecified: Secondary | ICD-10-CM | POA: Diagnosis not present

## 2018-06-25 DIAGNOSIS — R69 Illness, unspecified: Secondary | ICD-10-CM | POA: Diagnosis not present

## 2018-06-25 DIAGNOSIS — G43909 Migraine, unspecified, not intractable, without status migrainosus: Secondary | ICD-10-CM | POA: Diagnosis not present

## 2018-06-25 DIAGNOSIS — E039 Hypothyroidism, unspecified: Secondary | ICD-10-CM | POA: Diagnosis not present

## 2018-06-25 DIAGNOSIS — G8929 Other chronic pain: Secondary | ICD-10-CM | POA: Diagnosis not present

## 2018-06-25 DIAGNOSIS — M1990 Unspecified osteoarthritis, unspecified site: Secondary | ICD-10-CM | POA: Diagnosis not present

## 2018-07-14 ENCOUNTER — Telehealth: Payer: Self-pay | Admitting: *Deleted

## 2018-07-14 ENCOUNTER — Other Ambulatory Visit: Payer: Self-pay | Admitting: *Deleted

## 2018-07-14 MED ORDER — CELECOXIB 200 MG PO CAPS: 200.0000 mg | ORAL_CAPSULE | Freq: Every day | ORAL | 0 refills | Status: DC

## 2018-07-14 NOTE — Telephone Encounter (Signed)
Medication refilled

## 2018-07-15 ENCOUNTER — Encounter: Payer: Self-pay | Admitting: Internal Medicine

## 2018-07-15 DIAGNOSIS — K5730 Diverticulosis of large intestine without perforation or abscess without bleeding: Secondary | ICD-10-CM | POA: Diagnosis not present

## 2018-07-15 DIAGNOSIS — K6389 Other specified diseases of intestine: Secondary | ICD-10-CM | POA: Diagnosis not present

## 2018-07-15 DIAGNOSIS — Z86010 Personal history of colonic polyps: Secondary | ICD-10-CM | POA: Diagnosis not present

## 2018-07-15 DIAGNOSIS — Z1211 Encounter for screening for malignant neoplasm of colon: Secondary | ICD-10-CM | POA: Diagnosis not present

## 2018-07-16 ENCOUNTER — Encounter: Payer: Self-pay | Admitting: Internal Medicine

## 2018-08-02 DIAGNOSIS — G4733 Obstructive sleep apnea (adult) (pediatric): Secondary | ICD-10-CM | POA: Diagnosis not present

## 2018-08-02 DIAGNOSIS — R69 Illness, unspecified: Secondary | ICD-10-CM | POA: Diagnosis not present

## 2018-08-19 ENCOUNTER — Ambulatory Visit: Payer: Medicare HMO | Admitting: Neurology

## 2018-08-21 DIAGNOSIS — H5310 Unspecified subjective visual disturbances: Secondary | ICD-10-CM | POA: Diagnosis not present

## 2018-08-23 ENCOUNTER — Ambulatory Visit: Payer: Medicare Other | Admitting: Internal Medicine

## 2018-08-31 ENCOUNTER — Ambulatory Visit: Payer: Medicare HMO | Admitting: Neurology

## 2018-09-02 ENCOUNTER — Encounter: Payer: Medicare HMO | Admitting: Internal Medicine

## 2018-09-13 ENCOUNTER — Ambulatory Visit: Payer: Medicare HMO | Admitting: Neurology

## 2018-09-20 ENCOUNTER — Encounter: Payer: Self-pay | Admitting: Internal Medicine

## 2018-09-20 ENCOUNTER — Ambulatory Visit (INDEPENDENT_AMBULATORY_CARE_PROVIDER_SITE_OTHER): Payer: Medicare HMO | Admitting: Internal Medicine

## 2018-09-20 ENCOUNTER — Other Ambulatory Visit: Payer: Self-pay

## 2018-09-20 VITALS — BP 120/78 | HR 71 | Temp 98.6°F | Ht 67.0 in | Wt 184.0 lb

## 2018-09-20 DIAGNOSIS — Z9989 Dependence on other enabling machines and devices: Secondary | ICD-10-CM

## 2018-09-20 DIAGNOSIS — Z6828 Body mass index (BMI) 28.0-28.9, adult: Secondary | ICD-10-CM | POA: Diagnosis not present

## 2018-09-20 DIAGNOSIS — G4733 Obstructive sleep apnea (adult) (pediatric): Secondary | ICD-10-CM

## 2018-09-20 DIAGNOSIS — G43009 Migraine without aura, not intractable, without status migrainosus: Secondary | ICD-10-CM

## 2018-09-20 DIAGNOSIS — K5903 Drug induced constipation: Secondary | ICD-10-CM

## 2018-09-20 DIAGNOSIS — R69 Illness, unspecified: Secondary | ICD-10-CM | POA: Diagnosis not present

## 2018-09-20 DIAGNOSIS — F3342 Major depressive disorder, recurrent, in full remission: Secondary | ICD-10-CM

## 2018-09-20 DIAGNOSIS — Z0000 Encounter for general adult medical examination without abnormal findings: Secondary | ICD-10-CM

## 2018-09-20 DIAGNOSIS — F5103 Paradoxical insomnia: Secondary | ICD-10-CM

## 2018-09-20 DIAGNOSIS — M5030 Other cervical disc degeneration, unspecified cervical region: Secondary | ICD-10-CM

## 2018-09-20 DIAGNOSIS — E663 Overweight: Secondary | ICD-10-CM | POA: Diagnosis not present

## 2018-09-20 DIAGNOSIS — T402X5A Adverse effect of other opioids, initial encounter: Secondary | ICD-10-CM

## 2018-09-20 NOTE — Progress Notes (Signed)
Provider:  Rexene Edison. Mariea Clonts, D.O., C.M.D. Location:    [facility]  Place of Service:   clinic  Previous PCP: Gayland Curry, DO [name], [name] Megan Salon, MD as Consulting Physician (Gynecology) Tiajuana Amass, MD as Referring Physician (Allergy and Immunology) Jari Pigg, MD as Consulting Physician (Dermatology) Luberta Mutter, MD as Consulting Physician (Ophthalmology) Plonk, Vivien Presto, MD as Referring Physician (Otolaryngology) Dohmeier, Asencion Partridge, MD as Consulting Physician (Neurology) Reynold Bowen, MD as Consulting Physician (Endocrinology) Lendon Colonel, NP as Nurse Practitioner (Nurse Practitioner)  Extended Emergency Contact Information Primary Emergency Contact: Garciamartinez,Larry Address: [address]          [place], [place] [address] Johnnette Litter of [place] Phone: [phone] Mobile Phone: [phone] Relation: Spouse  Goals of Care: Advanced Directive information Advanced Directives 10/28/2017  Does Patient Have a Medical Advance Directive? Yes  Type of Advance Directive [place]  Does patient want to make changes to medical advance directive? No - Patient declined  Copy of [place] in Chart? Yes  Would patient like information on creating a medical advance directive? -  Pre-existing out of facility DNR order (yellow form or pink MOST form) -  Some encounter information is confidential and restricted. Go to Review Flowsheets activity to see all data.   Chief Complaint  Patient presents with  . Annual Exam    CPE    HPI: Patient is a 78 y.o. female seen today for an annual physical exam.  She's gained weight.  She is now walking, but cannot do the water aerobics b/c it's closed.  She has more pains from walking.    Emotionally, her stress level has gone down with Ollie being in memory care.    She had her cscope:  Taking 2 amitiza by day and 2 scoops of  miralax.  Able to have bms with that routine.  Neck pain:  If takes tramadol and spaces it out, she does pretty well.  She goes to sleep nightly on an ice pack.  When she gets up to use the bathroom, she puts another ice pack.  She will get a headache if she misses her pills.    Sleep apnea:  Has to get mask adjusted.  She's having 4-8 episodes an hour per her machine.  Does have an appt to see Dr. Brett Fairy in October.    No falls lately.  Lunesta was switched to trazodone by Dr. Brett Fairy and she's less unsteady.   She does not sleep quite as well.  Dr. Casimiro Needle was also ok with that change.    She did a virtual visit with Dr. Forde Dandy.  She sees him in September.  Levels have been good so he's not worried about testing sooner.    She had cataract surgery with Dr. Ellie Lunch in December which was a blessing.  Vision is much improved.    She has no new concerns.  Still says her short-term memory is getting worse.  She'll take out half and half and forget she has for her coffee.    She will be due for bone density in 2021 per Dr. Baldwin Crown notes.  She has osteopenia.  Had a basal cell removed from her chin.    Past Medical History:  Diagnosis Date  . Cancer ([place])    basal cell skin biopsies X2  . 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 Constipation   . Depression   .  Fibromyalgia 8/98   [place]  . 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 Impaired hearing    left ear, wears hearing aids  . Insomnia    circadian rhythm component  . Insomnia   . Migraine 10/88   [place]  . Nuclear sclerosis   . OSA on CPAP 2/07   uses cpap setting of 10  . Osteoarthritis 2007   [place]  . Pain last week   left under breast pain   . Plantar fasciitis   . Scarlet fever as child  . Thyroid disorder   . Vitreous degeneration of right eye    Past Surgical History:  Procedure Laterality Date  . BREAST BIOPSY Left 6/00   [place]  . BREAST EXCISIONAL BIOPSY Left 1998  . DE QUERVAIN'S RELEASE  Right 10/97   [place]  . left breast biopsy    . ROOT CANAL  02/11/2012  . TONSILLECTOMY  age 65  . TONSILLECTOMY AND ADENOIDECTOMY  1952  . 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 TOTAL KNEE ARTHROPLASTY Right 11/08/2012   Procedure: RIGHT TOTAL KNEE ARTHROPLASTY;  Surgeon: Gearlean Alf, MD;  Location: [facility];  Service: Orthopedics;  Laterality: Right;  . TUBAL LIGATION      reports that she is a non-smoker but has been exposed to tobacco smoke. She has been exposed to tobacco smoke for the past 30.00 years. She has never used smokeless tobacco. She reports current alcohol use. She reports that she does not use drugs.  Functional Status Survey:    Family History  Problem Relation Age of Onset  . Breast cancer Mother   . Aneurysm Father   . Migraines Father   . Breast cancer Maternal Aunt   . Breast cancer Maternal Aunt   . Breast cancer Maternal Aunt   . Breast cancer Maternal Aunt   . High blood pressure Brother   . Melanoma Brother   . Kidney cancer Brother        Lesion on kidney  . Heart disease Other        Grandmother    Health Maintenance  Topic Date Due  . MAMMOGRAM  05/12/2019  . COLONOSCOPY  07/14/2021  . TETANUS/TDAP  05/31/2027  . DEXA SCAN  Completed  . PNA vac Low Risk Adult  Completed    Allergies  Allergen Reactions  . Penicillins Anaphylaxis    Tongue swelling  . Codeine Nausea Only  . Provigil [Modafinil]   . Seroquel [Quetiapine Fumarate]   . Xyrem [Sodium Oxybate]     hallucination  . Ciprofloxacin Rash  . Monistat [Miconazole] Rash  . Terazol [Terconazole] Rash    Outpatient Encounter Medications as of 09/20/2018  Medication Sig  . AMITIZA 24 MCG capsule TAKE 1 CAPSULE TWICE A DAY WITH MEALS  . aspirin EC 81 MG tablet Take 81 mg by mouth daily.  . Aspirin-Acetaminophen-Caffeine (EXCEDRIN MIGRAINE PO) Take by mouth daily.   Marland Kitchen azelastine (ASTELIN) 0.1 % nasal spray USE 1 SPRAY IN EACH NOSTRIL EVERY DAY  . celecoxib (CELEBREX) 200 MG  capsule Take 1 capsule (200 mg total) by mouth daily.  . Cholecalciferol (VITAMIN D3) 5000 units CAPS Take by mouth.  . doxepin (SINEQUAN) 100 MG capsule Take 1 capsule (100 mg total) by mouth at bedtime.  Marland Kitchen levothyroxine (SYNTHROID, LEVOTHROID) 200 MCG tablet Take 200 mcg by mouth daily before breakfast.  . LORazepam (ATIVAN) 1 MG tablet Take 1 mg by mouth 2 (two) times daily.   . Melatonin 5  MG TABS Take 15 mg by mouth at bedtime.   . montelukast (SINGULAIR) 10 MG tablet Take 10 mg by mouth every evening.  . Multiple Vitamin (MULTIVITAMIN) capsule Take 1 capsule by mouth daily.  . polyethylene glycol (MIRALAX / GLYCOLAX) packet Take 17 g by mouth daily.  . traMADol (ULTRAM) 50 MG tablet Take 1 tablet (50 mg total) by mouth 3 (three) times daily as needed.  . traZODone (DESYREL) 50 MG tablet Take 50 mg by mouth at bedtime.  . valACYclovir (VALTREX) 1000 MG tablet Take 1,000 mg by mouth as needed.  . vitamin C (ASCORBIC ACID) 500 MG tablet Take 500 mg by mouth daily.  . [DISCONTINUED] betamethasone valerate ointment (VALISONE) 0.1 % Apply 1 application topically 2 (two) times daily.  . [DISCONTINUED] DUREZOL 0.05 % EMUL PLACE 1 DROP INTO RIGHT EYE 4 TIMES A DAY AS DIRECTED AFTER SURGERY  . [DISCONTINUED] Eszopiclone 3 MG TABS Take 1 tablet (3 mg total) by mouth at bedtime. Take immediately before bedtime  . [DISCONTINUED] fluconazole (DIFLUCAN) 150 MG tablet Take 1 tablet (150 mg total) by mouth daily. Take one tablet, repeat x 1 if needed in 72 hours.  . [DISCONTINUED] nystatin ointment (MYCOSTATIN) Apply 1 application topically 2 (two) times daily. Apply to affected area for up to 7 days.  . [DISCONTINUED] Omega-3 Fatty Acids (FISH OIL) 1000 MG CAPS Take by mouth daily.  . [DISCONTINUED] traZODone (DESYREL) 50 MG tablet TAKE 1/2 TO 1 TABLET BY MOUTH EVERY DAY AT BEDTIME.  . [DISCONTINUED] valACYclovir (VALTREX) 500 MG tablet One tablet po qd, increase to one tablet BID x 3 days as needed.    No facility-administered encounter medications on file as of 09/20/2018.     Review of Systems  Constitutional: Negative for chills, fever and malaise/fatigue.  HENT: Positive for hearing loss. Negative for congestion and sore throat.        Reports hearing is getting worse  Eyes: Negative for blurred vision.  Respiratory: Negative for cough and shortness of breath.   Cardiovascular: Negative for chest pain, palpitations and leg swelling.  Gastrointestinal: Positive for constipation. Negative for abdominal pain, blood in stool, diarrhea and melena.       Constipation controlled with amitiza and miralax  Genitourinary: Negative for dysuria.  Musculoskeletal: Positive for neck pain. Negative for falls and joint pain.  Skin: Negative for itching and rash.  Neurological: Negative for dizziness, tingling, sensory change, weakness and headaches.  Endo/Heme/Allergies: Does not bruise/bleed easily.  Psychiatric/Behavioral: Positive for memory loss. Negative for depression. The patient has insomnia. The patient is not nervous/anxious.     Vitals:   09/20/18 1332  BP: 120/78  Pulse: 71  Temp: 98.6 F (37 C)  TempSrc: Oral  SpO2: 96%  Weight: 184 lb (83.5 kg)  Height: 5\' 7"  (1.702 m)   Body mass index is 28.82 kg/m. Physical Exam Vitals signs reviewed.  Constitutional:      General: She is not in acute distress.    Appearance: Normal appearance. She is not ill-appearing or toxic-appearing.  HENT:     Head: Normocephalic and atraumatic.     Right Ear: Tympanic membrane, ear canal and external ear normal. There is no impacted cerumen.     Left Ear: Tympanic membrane, ear canal and external ear normal. There is no impacted cerumen.     Nose:     Comments: Nose/mouth deferred due to covid masking and no related concerns Eyes:     Extraocular Movements: Extraocular movements intact.  Conjunctiva/sclera: Conjunctivae normal.     Pupils: Pupils are equal, round, and reactive to  light.  Neck:     Musculoskeletal: Neck supple. No muscular tenderness.  Cardiovascular:     Rate and Rhythm: Normal rate and regular rhythm.     Pulses: Normal pulses.     Heart sounds: Normal heart sounds.  Pulmonary:     Effort: Pulmonary effort is normal.     Breath sounds: Normal breath sounds.  Abdominal:     General: Bowel sounds are normal. There is no distension.     Palpations: Abdomen is soft. There is no mass.     Tenderness: There is no abdominal tenderness. There is no guarding or rebound.  Musculoskeletal: Normal range of motion.     Right lower leg: No edema.     Left lower leg: No edema.  Lymphadenopathy:     Cervical: No cervical adenopathy.  Skin:    General: Skin is warm and dry.     Capillary Refill: Capillary refill takes less than 2 seconds.  Neurological:     General: No focal deficit present.     Mental Status: She is alert and oriented to person, place, and time. Mental status is at baseline.     Cranial Nerves: No cranial nerve deficit.     Motor: No weakness.     Gait: Gait normal.  Psychiatric:        Mood and Affect: Mood normal.        Behavior: Behavior normal.        Thought Content: Thought content normal.        Judgment: Judgment normal.     Labs reviewed: will have labs in September with Dr. Forde Dandy and share them with me Basic Metabolic Panel: No results for input(s): NA, K, CL, CO2, GLUCOSE, BUN, CREATININE, CALCIUM, MG, PHOS in the last 8760 hours. Liver Function Tests: No results for input(s): AST, ALT, ALKPHOS, BILITOT, PROT, ALBUMIN in the last 8760 hours. No results for input(s): LIPASE, AMYLASE in the last 8760 hours. No results for input(s): AMMONIA in the last 8760 hours. CBC: No results for input(s): WBC, NEUTROABS, HGB, HCT, MCV, PLT in the last 8760 hours. Cardiac Enzymes: No results for input(s): CKTOTAL, CKMB, CKMBINDEX, TROPONINI in the last 8760 hours. BNP: Invalid input(s): POCBNP Lab Results  Component Value Date    HGBA1C 5.5 08/27/2015   Lab Results  Component Value Date   TSH 0.01 (L) 02/11/2017   Lab Results  Component Value Date   VITAMINB12 628 02/11/2017   No results found for: FOLATE  Assessment/Plan 1. Annual physical exam -performed today -also sees gyn for female exam  2. Overweight with body mass index (BMI) 25.0-29.9 -persists, she's limited currently in her exercise routine b/c she cannot go to [facility] for her water aerobics  3. Body mass index 28.0-28.9, adult -continue the walking she has been doing which is keeping her from gaining too much weight  4. 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 OSA on CPAP -cont CPAP--needs to have mask adjusted and she will call Dr. Edwena Felty office to set that up b/c she doesn't see her until fall  6. Degenerative disc disease, cervical -neck pain is stable lately with regular tramadol use  7. Migraines -continues her routine  of celebrex, tramadol, excedrin, doxepin for neck and headache pain  8. Therapeutic opioid-induced constipation (OIC) -cont amitiza and miralax which has been effective  9. Paradoxical insomnia -doing ok with trazodone therapy, also takes melatonin nightly  Labs/tests ordered:  No new--gets done at endocrine and brings me results  Jasleen Riepe L. Montzerrat Brunell, D.O. [facility] [address], [place] [address] Cell Phone (Mon-Fri 8am-5pm):  [phone] On Call:  [phone] & follow prompts after 5pm & weekends Office Phone:  [phone] Office Fax:  [phone]

## 2018-09-20 NOTE — Patient Instructions (Addendum)
Please bring your labs with you from Dr. Forde Dandy when we meet next time.  Fat and Cholesterol Restricted Eating Plan Getting too much fat and cholesterol in your diet may cause health problems. Choosing the right foods helps keep your fat and cholesterol at normal levels. This can keep you from getting certain diseases. What are tips for following this plan? Meal planning  At meals, divide your plate into four equal parts: ? Fill one-half of your plate with vegetables and green salads. ? Fill one-fourth of your plate with whole grains. ? Fill one-fourth of your plate with low-fat (lean) protein foods.  Eat fish that is high in omega-3 fats at least two times a week. This includes mackerel, tuna, sardines, and salmon.  Eat foods that are high in fiber, such as whole grains, beans, apples, broccoli, carrots, peas, and barley. General tips   Work with your doctor to lose weight if you need to.  Avoid: ? Foods with added sugar. ? Fried foods. ? Foods with partially hydrogenated oils.  Limit alcohol intake to no more than 1 drink a day for nonpregnant women and 2 drinks a day for men. One drink equals 12 oz of beer, 5 oz of wine, or 1 oz of hard liquor. Reading food labels  Check food labels for: ? Trans fats. ? Partially hydrogenated oils. ? Saturated fat (g) in each serving. ? Cholesterol (mg) in each serving. ? Fiber (g) in each serving.  Choose foods with healthy fats, such as: ? Monounsaturated fats. ? Polyunsaturated fats. ? Omega-3 fats.  Choose grain products that have whole grains. Look for the word "whole" as the first word in the ingredient list. Cooking  Cook foods using low-fat methods. These include baking, boiling, grilling, and broiling.  Eat more home-cooked foods. Eat at restaurants and buffets less often.  Avoid cooking using saturated fats, such as butter, cream, palm oil, palm kernel oil, and coconut oil. Recommended foods  Fruits  All fresh, canned  (in natural juice), or frozen fruits. Vegetables  Fresh or frozen vegetables (raw, steamed, roasted, or grilled). Green salads. Grains  Whole grains, such as whole wheat or whole grain breads, crackers, cereals, and pasta. Unsweetened oatmeal, bulgur, barley, quinoa, or brown rice. Corn or whole wheat flour tortillas. Meats and other protein foods  Ground beef (85% or leaner), grass-fed beef, or beef trimmed of fat. Skinless chicken or [place]. Ground chicken or [place]. Pork trimmed of fat. All fish and seafood. Egg whites. Dried beans, peas, or lentils. Unsalted nuts or seeds. Unsalted canned beans. Nut butters without added sugar or oil. Dairy  Low-fat or nonfat dairy products, such as skim or 1% milk, 2% or reduced-fat cheeses, low-fat and fat-free ricotta or cottage cheese, or plain low-fat and nonfat yogurt. Fats and oils  Tub margarine without trans fats. Light or reduced-fat mayonnaise and salad dressings. Avocado. Olive, canola, sesame, or safflower oils. The items listed above may not be a complete list of foods and beverages you can eat. Contact a dietitian for more information. Foods to avoid Fruits  Canned fruit in heavy syrup. Fruit in cream or butter sauce. Fried fruit. Vegetables  Vegetables cooked in cheese, cream, or butter sauce. Fried vegetables. Grains  White bread. White pasta. White rice. Cornbread. Bagels, pastries, and croissants. Crackers and snack foods that contain trans fat and hydrogenated oils. Meats and other protein foods  Fatty cuts of meat. Ribs, chicken wings, bacon, sausage, bologna, salami, chitterlings, fatback, hot dogs, bratwurst, and packaged lunch  meats. Liver and organ meats. Whole eggs and egg yolks. Chicken and [place] with skin. Fried meat. Dairy  Whole or 2% milk, cream, half-and-half, and cream cheese. Whole milk cheeses. Whole-fat or sweetened yogurt. Full-fat cheeses. Nondairy creamers and whipped toppings. Processed cheese, cheese  spreads, and cheese curds. Beverages  Alcohol. Sugar-sweetened drinks such as sodas, lemonade, and fruit drinks. Fats and oils  Butter, stick margarine, lard, shortening, ghee, or bacon fat. Coconut, palm kernel, and palm oils. Sweets and desserts  Corn syrup, sugars, honey, and molasses. Candy. Jam and jelly. Syrup. Sweetened cereals. Cookies, pies, cakes, donuts, muffins, and ice cream. The items listed above may not be a complete list of foods and beverages you should avoid. Contact a dietitian for more information. Summary  Choosing the right foods helps keep your fat and cholesterol at normal levels. This can keep you from getting certain diseases.  At meals, fill one-half of your plate with vegetables and green salads.  Eat high-fiber foods, like whole grains, beans, apples, carrots, peas, and barley.  Limit added sugar, saturated fats, alcohol, and fried foods. This information is not intended to replace advice given to you by your health care provider. Make sure you discuss any questions you have with your health care provider. Document Released: 08/12/2011 Document Revised: 10/14/2017 Document Reviewed: 10/28/2016 Elsevier Patient Education  2020 [employer].

## 2018-09-28 DIAGNOSIS — L814 Other melanin hyperpigmentation: Secondary | ICD-10-CM | POA: Diagnosis not present

## 2018-09-28 DIAGNOSIS — D2239 Melanocytic nevi of other parts of face: Secondary | ICD-10-CM | POA: Diagnosis not present

## 2018-09-28 DIAGNOSIS — D225 Melanocytic nevi of trunk: Secondary | ICD-10-CM | POA: Diagnosis not present

## 2018-09-28 DIAGNOSIS — L918 Other hypertrophic disorders of the skin: Secondary | ICD-10-CM | POA: Diagnosis not present

## 2018-09-28 DIAGNOSIS — D1801 Hemangioma of skin and subcutaneous tissue: Secondary | ICD-10-CM | POA: Diagnosis not present

## 2018-09-28 DIAGNOSIS — Z85828 Personal history of other malignant neoplasm of skin: Secondary | ICD-10-CM | POA: Diagnosis not present

## 2018-09-28 DIAGNOSIS — L821 Other seborrheic keratosis: Secondary | ICD-10-CM | POA: Diagnosis not present

## 2018-10-07 ENCOUNTER — Other Ambulatory Visit: Payer: Self-pay | Admitting: Sports Medicine

## 2018-10-14 ENCOUNTER — Ambulatory Visit (INDEPENDENT_AMBULATORY_CARE_PROVIDER_SITE_OTHER): Payer: Medicare HMO | Admitting: Sports Medicine

## 2018-10-14 ENCOUNTER — Other Ambulatory Visit: Payer: Self-pay

## 2018-10-14 VITALS — BP 104/70 | Ht 66.0 in | Wt 183.0 lb

## 2018-10-14 DIAGNOSIS — M47812 Spondylosis without myelopathy or radiculopathy, cervical region: Secondary | ICD-10-CM | POA: Diagnosis not present

## 2018-10-14 DIAGNOSIS — H903 Sensorineural hearing loss, bilateral: Secondary | ICD-10-CM | POA: Diagnosis not present

## 2018-10-14 MED ORDER — PREDNISONE 10 MG PO TABS: ORAL_TABLET | ORAL | 0 refills | Status: DC

## 2018-10-14 NOTE — Progress Notes (Signed)
   Subjective:    Patient ID: [name], female    DOB: December 10, 1940, 78 y.o.   MRN: [id number]  HPI chief complaint: Neck pain  [name] presents today complaining of neck pain.  This is a chronic problem for her.  She has been treated by Dr. Oneida Alar in the past.  She was last seen in February.  She was given a home exercise program but she admits that she has not been compliant.  She has been under a lot of stress due to the [place] pandemic.  She believes that is playing a role in her persistent neck pain.  It is diffuse but nonradiating.  She does endorse some numbness in her hands upon awakening in the morning but that is chronic for her.  X-rays of her cervical spine done in 2017 showed degenerative disc disease at C5-C6 and C6-C7.  She also has multilevel facet arthropathy.  She has been treated in the past with prednisone with excellent pain relief.  She also takes tramadol 3 times a day which is helpful.  Physical therapy with Barbaraann Barthel in the past worked well but she is concerned about COVID-19 and returning to physical therapy during the pandemic.  She denies any recent trauma.  She does have a history of migraine headaches which are worse with her neck pain.  Interim medical history reviewed Medications reviewed Allergies reviewed   Review of Systems As above    Objective:   Physical Exam  Well-developed, well-nourished.  No acute distress.  Awake alert and oriented x3.  Vital signs reviewed  Cervical spine: Patient has limited cervical rotation by about 50% to the left and right.  Severely limited extension.  Good flexion.  There is no tenderness to palpation along the cervical midline.  There is some tenderness to palpation along the facets on the left and right, worse on the left at the C5-C6 area.  No spasm.  Neurological exam shows no gross deficits of either upper extremity.      Assessment & Plan:   Chronic neck pain secondary to cervical degenerative disc disease  and facet arthropathy  Given the patient's great improvement with prednisone in the past, I have elected to put her on a 6-day Sterapred Dosepak to take as directed.  She will continue on her tramadol 3 times a day.  She is taking this responsibly and it helps tremendously.  I have asked her to be more compliant with her home exercise program which was given to her by Dr. Oneida Alar in February (isometric exercises in 4 planes).  Updated imaging and referral to Barbaraann Barthel could be options in the future if she continues to have significant discomfort despite today's treatment.  Follow-up as needed.

## 2018-10-21 ENCOUNTER — Ambulatory Visit: Payer: Self-pay

## 2018-10-25 ENCOUNTER — Other Ambulatory Visit: Payer: Self-pay

## 2018-10-25 MED ORDER — TRAMADOL HCL 50 MG PO TABS: 50.0000 mg | ORAL_TABLET | Freq: Three times a day (TID) | ORAL | 3 refills | Status: DC | PRN

## 2018-10-26 ENCOUNTER — Ambulatory Visit: Payer: Self-pay

## 2018-11-03 ENCOUNTER — Encounter: Payer: Medicare Other | Admitting: Family

## 2018-11-03 ENCOUNTER — Ambulatory Visit: Payer: Self-pay

## 2018-11-03 DIAGNOSIS — G4733 Obstructive sleep apnea (adult) (pediatric): Secondary | ICD-10-CM | POA: Diagnosis not present

## 2018-11-04 ENCOUNTER — Ambulatory Visit (INDEPENDENT_AMBULATORY_CARE_PROVIDER_SITE_OTHER): Payer: Medicare HMO

## 2018-11-04 ENCOUNTER — Telehealth: Payer: Self-pay

## 2018-11-04 ENCOUNTER — Other Ambulatory Visit: Payer: Self-pay

## 2018-11-04 DIAGNOSIS — Z23 Encounter for immunization: Secondary | ICD-10-CM | POA: Diagnosis not present

## 2018-11-04 NOTE — Telephone Encounter (Signed)
Per Dr. Oneida Alar she will start taking an extra Doxepin at midday along with her usual dose at bedtime. She will try this for the next couple of days and let us know how she's doing. Pt understands and agrees with the plan.

## 2018-11-09 ENCOUNTER — Other Ambulatory Visit: Payer: Self-pay

## 2018-11-09 ENCOUNTER — Encounter

## 2018-11-09 ENCOUNTER — Ambulatory Visit (INDEPENDENT_AMBULATORY_CARE_PROVIDER_SITE_OTHER): Payer: Medicare HMO | Admitting: Sports Medicine

## 2018-11-09 DIAGNOSIS — M47812 Spondylosis without myelopathy or radiculopathy, cervical region: Secondary | ICD-10-CM

## 2018-11-09 MED ORDER — DOXEPIN HCL 25 MG PO CAPS: ORAL_CAPSULE | ORAL | 3 refills | Status: DC

## 2018-11-09 NOTE — Assessment & Plan Note (Signed)
Reviewed XRs again and lots of DDD and OA in lower cervical spine  Will add 25 mg daily of doxepin at 5 PM and keep 100 mg at HS Cont. Tramadol at tid Now rarely uses celebrex - can use this qod if needed for pain break thru  Isometric exercises and easy ROM neck  Prn ice or heat  Reck 1 month

## 2018-11-09 NOTE — Progress Notes (Signed)
CC: DDD cervical spine  Hx of severe migraine triggered by Cx DDD and OA For past month has struggled with HA on most days Had been in control with tramadol and doxepin  Last week called and we gave additional 50 mg dose of doxepin This made her drowsy After sleep had much less HA  HA improves but during day if too much doxepin can't funtion  ROS No radicular sxs to either UE No weakness in hands or arms  PE [name]/ looks in some discomfort but NAD BP 120/75   Ht 5\' 6"  (1.676 m)   Wt 183 lb (83 kg)   LMP 02/25/1995 (Approximate)   BMI 29.54 kg/m    Neck Rotation limited to 30 deg RT and 35 left Lateral bend 15 deg RT and 20 Lt Flexion is full Ext. Limited to 20 deg  Trapezius spasm noted bilat  Strength testing C5 to T1 is normal Sensation is normal  DTRs are 1+

## 2018-11-10 ENCOUNTER — Encounter: Payer: Medicare HMO | Admitting: Family

## 2018-11-11 ENCOUNTER — Ambulatory Visit: Payer: Medicare HMO | Admitting: Sports Medicine

## 2018-11-14 ENCOUNTER — Other Ambulatory Visit: Payer: Self-pay | Admitting: Obstetrics and Gynecology

## 2018-11-15 NOTE — Telephone Encounter (Signed)
Medication refill request: Valtrex Last AEX:  04/20/2018 JJ Next AEX: 05/04/2019 Last MMG (if hormonal medication request): 05/12/2018 BIRADS 1 Negative Density C Refill authorized: Pending #[id number] with 1 refill if appropriate. Please advise.

## 2018-11-17 ENCOUNTER — Ambulatory Visit (INDEPENDENT_AMBULATORY_CARE_PROVIDER_SITE_OTHER): Payer: Medicare HMO | Admitting: Family

## 2018-11-17 ENCOUNTER — Encounter: Payer: Self-pay | Admitting: Family

## 2018-11-17 ENCOUNTER — Other Ambulatory Visit: Payer: Self-pay

## 2018-11-17 DIAGNOSIS — Z0000 Encounter for general adult medical examination without abnormal findings: Secondary | ICD-10-CM

## 2018-11-17 NOTE — Progress Notes (Signed)
Subjective:   [name] is a 78 y.o. female who presents for Medicare Annual (Subsequent) preventive examination.Spoke with patient on telephone at home.  Review of Systems:   Cardiac Risk Factors include: advanced age (>69men, >35 women);smoking/ tobacco exposure     Objective:     Vitals: LMP 02/25/1995 (Approximate)   There is no height or weight on file to calculate BMI.  Advanced Directives 10/28/2017 08/17/2017 09/25/2016 01/22/2016 08/31/2015 08/20/2015 07/31/2015  Does Patient Have a Medical Advance Directive? Yes Yes Yes No No Yes Yes  Type of Advance Directive Healthcare Power of [place];Living will  Does patient want to make changes to medical advance directive? No - Patient declined No - Patient declined - - - - -  Copy of [place] in Chart? Yes Yes Yes - - No - copy requested -  Would patient like information on creating a medical advance directive? - - - - - - -  Pre-existing out of facility DNR order (yellow form or pink MOST form) - - - - - - -  Some encounter information is confidential and restricted. Go to Review Flowsheets activity to see all data.    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      Counseling given: Not Answered Comment: flight attendant on smoking plane    Clinical Intake:  Pre-visit preparation completed: No  Pain : 0-10 Pain Score: 8  Pain Type: Chronic pain Pain Location: Generalized Pain Orientation: Other (Comment)(Multiple sites) Pain Descriptors / Indicators: Constant, Aching Pain Onset: Other (comment)(several years) Pain Frequency: Constant Pain Relieving Factors: ice,heating pad,rest,swimming Effect of Pain on Daily Activities: No  Pain Relieving Factors: ice,heating  pad,rest,swimming  BMI - recorded: 28.82 Nutritional Status: BMI 25 -29 Overweight Nutritional Risks: None Diabetes: No  How often do you need to have someone help you when you read instructions, pamphlets, or other written materials from your doctor or pharmacy?: 1 - Never What is the last grade level you completed in school?: 2 years of college  Interpreter Needed?: No  Information entered by :: Kayani Rapaport FNP-C  Past Medical History:  Diagnosis Date  . Cancer ([place])    basal cell skin biopsies X2  . 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 Constipation   . Depression   . Fibromyalgia 8/98   [place]  . 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 Impaired hearing    left ear, wears hearing aids  . Insomnia    circadian rhythm component  . Insomnia   . Migraine 10/88   [place]  . Nuclear sclerosis   . OSA on CPAP 2/07   uses cpap setting of 10  . Osteoarthritis 2007   [place]  . Pain last week   left under breast pain   . Plantar fasciitis   . Scarlet fever as child  . Thyroid disorder   . Vitreous degeneration of right eye    Past Surgical History:  Procedure Laterality Date  . BREAST BIOPSY Left 6/00   [place]  . BREAST EXCISIONAL BIOPSY Left 1998  . DE QUERVAIN'S RELEASE Right 10/97   [place]  . left breast biopsy    . ROOT CANAL  02/11/2012  . TONSILLECTOMY  age 55  . TONSILLECTOMY  AND ADENOIDECTOMY  1952  . 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 TOTAL KNEE ARTHROPLASTY Right 11/08/2012   Procedure: RIGHT TOTAL KNEE ARTHROPLASTY;  Surgeon: Gearlean Alf, MD;  Location: [facility];  Service: Orthopedics;  Laterality: Right;  . TUBAL LIGATION     Family History  Problem Relation Age of Onset  . Breast cancer Mother   . Aneurysm Father   . Migraines Father   . Breast cancer Maternal Aunt   . Breast cancer Maternal Aunt   . Breast cancer Maternal Aunt   . Breast cancer Maternal Aunt   . High blood pressure Brother   . Melanoma Brother   .  Kidney cancer Brother        Lesion on kidney  . Heart disease Other        Grandmother   Social History   Socioeconomic History  . Marital status: Married    Spouse name: [name]  . Number of children: 0  . Years of education: 36  . Highest education level: Not on file  Occupational History  . Not on file  Social Needs  . Financial resource strain: Not hard at all  . Food insecurity    Worry: Never true    Inability: Never true  . Transportation needs    Medical: No    Non-medical: No  Tobacco Use  . Smoking status: Passive Smoke Exposure - Never Smoker  . Smokeless tobacco: Never Used  . 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 Alcohol use: Yes    Alcohol/week: 0.0 standard drinks    Comment: 1 glass - occas.  . Drug use: No  . Sexual activity: Not Currently    Partners: Male    Birth control/protection: Post-menopausal  Lifestyle  . Physical activity    Days per week: 4 days    Minutes per session: 60 min  . Stress: Only a little  Relationships  . Social connections    Talks on phone: More than three times a week    Gets together: More than three times a week    Attends religious service: More than 4 times per year    Active member of club or organization: No    Attends meetings of clubs or organizations: Never    Relationship status: Married  Other Topics Concern  . Not on file  Social History Narrative   Patient is married [name]).   Patient drinks 2-4 cups of coffee daily.   Patient is retired.   Patient has two years of college.   Patient is right-handed.                Outpatient Encounter Medications as of 11/17/2018  Medication Sig  . aspirin EC 81 MG tablet Take 81 mg by mouth daily.  . Aspirin-Acetaminophen-Caffeine (EXCEDRIN MIGRAINE PO) Take 2 capsules by mouth as needed.   . celecoxib (CELEBREX) 200 MG capsule TAKE 1 CAPSULE BY MOUTH EVERY DAY  . Cholecalciferol (VITAMIN D3) 5000 units CAPS  Take by mouth daily.   Marland Kitchen doxepin (SINEQUAN) 100 MG capsule Take 1 capsule (100 mg total) by mouth at bedtime.  Marland Kitchen doxepin (SINEQUAN) 25 MG capsule Take one capsule at 4-5 pm daily. (Patient taking differently: Take one capsule at 4-5 pm daily. Patient take an additional 100 mg  4 tablets at 9 pm)  . Eszopiclone (ESZOPICLONE) 3 MG TABS Take 3 mg by mouth at bedtime. Take immediately before bedtime  .  levothyroxine (SYNTHROID, LEVOTHROID) 200 MCG tablet Take 200 mcg by mouth daily before breakfast.  . LORazepam (ATIVAN) 1 MG tablet Take 1 mg by mouth 2 (two) times daily.   . Melatonin 5 MG TABS Take 15 mg by mouth at bedtime.   . montelukast (SINGULAIR) 10 MG tablet Take 10 mg by mouth every evening.  . polyethylene glycol (MIRALAX / GLYCOLAX) packet Take 17 g by mouth daily.  . traMADol (ULTRAM) 50 MG tablet Take 1 tablet (50 mg total) by mouth 3 (three) times daily as needed.  . valACYclovir (VALTREX) 500 MG tablet TAKE 1 TABLET EVERY DAY INCREASE TO 1 TABLET TWICE DAILY FOR 3 DAYS AS NEEDED  . [DISCONTINUED] predniSONE (DELTASONE) 10 MG tablet Take as directed per MD instructions  . [DISCONTINUED] vitamin C (ASCORBIC ACID) 500 MG tablet Take 500 mg by mouth daily.   No facility-administered encounter medications on file as of 11/17/2018.     Activities of Daily Living In your present state of health, do you have any difficulty performing the following activities: 11/17/2018  Hearing? Y  Comment wearing hearing Aids  Vision? N  Difficulty concentrating or making decisions? Y  Comment Remebering  Walking or climbing stairs? N  Dressing or bathing? N  Doing errands, shopping? N  Preparing Food and eating ? N  Using the Toilet? N  In the past six months, have you accidently leaked urine? Y  Do you have problems with loss of bowel control? N  Managing your Medications? N  Managing your Finances? N  Housekeeping or managing your Housekeeping? Y  Comment has an assistance  Some recent data  might be hidden    [name], [name] Megan Salon, MD as Consulting Physician (Gynecology) Tiajuana Amass, MD as Referring Physician (Allergy and Immunology) Jari Pigg, MD as Consulting Physician (Dermatology) Luberta Mutter, MD as Consulting Physician (Ophthalmology) Plonk, Vivien Presto, MD as Referring Physician (Otolaryngology) Dohmeier, Asencion Partridge, MD as Consulting Physician (Neurology) Reynold Bowen, MD as Consulting Physician (Endocrinology) Lendon Colonel, NP as Nurse Practitioner (Nurse Practitioner)    Assessment:   This is a routine wellness examination for [place].  Exercise Activities and Dietary recommendations Current Exercise Habits: Home exercise routine, Type of exercise: walking, Time (Minutes): 60, Frequency (Times/Week): 5, Weekly Exercise (Minutes/Week): 300, Intensity: Moderate, Exercise limited by: None identified  Goals   None     Fall Risk Fall Risk  11/17/2018 09/20/2018 02/22/2018 12/14/2017 11/23/2017  Falls in the past year? 0 0 1 Yes Yes  Number falls in past yr: 0 0 0 1 2 or more  Comment - - - - -  Injury with Fall? 0 0 1 Yes Yes  Risk for fall due to : - - History of fall(s);Impaired balance/gait;Medication side effect - -  Follow up - - Falls evaluation completed;Education provided;Falls prevention discussed - -  Comment - - decrease alcohol intake and several meds from other providers has been suggested on numerous occasions, but then pt sees them and they restart them - -   Is the patient's home free of loose throw rugs in walkways, pet beds, electrical cords, etc?   no      Grab bars in the bathroom? no      Handrails on the stairs?   yes      Adequate lighting?   yes  Depression Screen PHQ 2/9 Scores 11/17/2018 09/20/2018 02/22/2018 12/14/2017  PHQ - 2 Score 0 0 0  0  PHQ- 9 Score - - - -  Exception Documentation - - - -     Cognitive Function MMSE - Mini Mental State Exam  10/28/2017 09/23/2016 08/31/2015  Orientation to time 5 5 4   Orientation to Place 5 5 5   Registration 3 3 3   Attention/ Calculation 5 5 4   Recall 2 2 3   Language- name 2 objects 2 2 2   Language- repeat 1 1 1   Language- follow 3 step command 3 3 1   Language- read & follow direction 1 1 1   Write a sentence 1 1 1   Copy design 1 1 1   Total score 29 29 26      6CIT Screen 11/17/2018  What Year? 0 points  What month? 0 points  What time? 0 points  Count back from 20 0 points  Months in reverse 0 points  Repeat phrase 0 points  Total Score 0    Immunization History  Administered Date(s) Administered  . Fluad Quad(high Dose 65+) 11/04/2018  . Influenza, High Dose Seasonal PF 12/08/2016, 10/28/2017  . Pneumococcal Conjugate-13 10/18/2013  . Pneumococcal Polysaccharide-23 11/01/2012  . Tdap 10/10/2010, 05/30/2017  . Zoster 02/24/2002  . Zoster Recombinat (Shingrix) 06/24/2016    Qualifies for Shingles Vaccine? Up to date   Screening Tests Health Maintenance  Topic Date Due  . MAMMOGRAM  05/12/2019  . COLONOSCOPY  07/14/2021  . TETANUS/TDAP  05/31/2027  . DEXA SCAN  Completed  . PNA vac Low Risk Adult  Completed    Cancer Screenings: Lung: Low Dose CT Chest recommended if Age 17-80 years, 30 pack-year currently smoking OR have quit w/in 15years. Patient does not qualify. Breast:  Up to date on Mammogram? Yes   Up to date of Bone Density/Dexa? Yes Colorectal: Yes   Additional Screenings:  Hepatitis C Screening: Low risk    Plan:   I have personally reviewed and noted the following in the patient's chart:   . Medical and social history . Use of alcohol, tobacco or illicit drugs  . Current medications and supplements . Functional ability and status . Nutritional status . Physical activity . Advanced directives . List of other physicians . Hospitalizations, surgeries, and ER visits in previous 12 months . Vitals . Screenings to include cognitive, depression, and  falls . Referrals and appointments  In addition, I have reviewed and discussed with patient certain preventive protocols, quality metrics, and best practice recommendations. A written personalized care plan for preventive services as well as general preventive health recommendations were provided to patient.   Sandrea Hughs, NP  11/17/2018

## 2018-11-17 NOTE — Progress Notes (Signed)
This service is provided via telemedicine  No vital signs collected/recorded due to the encounter was a telemedicine visit.   Location of patient (ex: home, work):  Home   Patient consents to a telephone visit:  Yes  Location of the provider (ex: office, home):  Office   Name of any referring provider: Hollace Kinnier, [place] of all persons participating in the telemedicine service and their role in the encounter:  Dinah Ngetich NP, Ruthell Rummage CMA, and Gigi Gin   Time spent on call: Ruthell Rummage CMA, spent 10 minutes on phone with patient

## 2018-11-17 NOTE — Patient Instructions (Signed)
[name] , Thank you for taking time to come for your Medicare Wellness Visit. I appreciate your ongoing commitment to your health goals. Please review the following plan we discussed and let me know if I can assist you in the future.   Screening recommendations/referrals: Colonoscopy: Due 07/14/2021 Mammogram : due 05/12/2019  Bone Density: completed  Recommended yearly ophthalmology/optometry visit for glaucoma screening and checkup Recommended yearly dental visit for hygiene and checkup  Vaccinations: Influenza vaccine: Up to date  Pneumococcal vaccine : Up to date  Tdap vaccine : 05/31/2027  Shingles vaccine: Up to date    Advanced directives: yes   Conditions/risks identified: Advance age female > 46 yrs,Hx smoking exposure   Next appointment: 1 year    Preventive Care 78 Years and Older, Female Preventive care refers to lifestyle choices and visits with your health care provider that can promote health and wellness. What does preventive care include?  A yearly physical exam. This is also called an annual well check.  Dental exams once or twice a year.  Routine eye exams. Ask your health care provider how often you should have your eyes checked.  Personal lifestyle choices, including:  Daily care of your teeth and gums.  Regular physical activity.  Eating a healthy diet.  Avoiding tobacco and drug use.  Limiting alcohol use.  Practicing safe sex.  Taking low-dose aspirin every day.  Taking vitamin and mineral supplements as recommended by your health care provider. What happens during an annual well check? The services and screenings done by your health care provider during your annual well check will depend on your age, overall health, lifestyle risk factors, and family history of disease. Counseling  Your health care provider may ask you questions about your:  Alcohol use.  Tobacco use.  Drug use.  Emotional well-being.  Home and relationship  well-being.  Sexual activity.  Eating habits.  History of falls.  Memory and ability to understand (cognition).  Work and work Statistician.  Reproductive health. Screening  You may have the following tests or measurements:  Height, weight, and BMI.  Blood pressure.  Lipid and cholesterol levels. These may be checked every 5 years, or more frequently if you are over 35 years old.  Skin check.  Lung cancer screening. You may have this screening every year starting at age 49 if you have a 30-pack-year history of smoking and currently smoke or have quit within the past 15 years.  Fecal occult blood test (FOBT) of the stool. You may have this test every year starting at age 76.  Flexible sigmoidoscopy or colonoscopy. You may have a sigmoidoscopy every 5 years or a colonoscopy every 10 years starting at age 67.  Hepatitis C blood test.  Hepatitis B blood test.  Sexually transmitted disease (STD) testing.  Diabetes screening. This is done by checking your blood sugar (glucose) after you have not eaten for a while (fasting). You may have this done every 1-3 years.  Bone density scan. This is done to screen for osteoporosis. You may have this done starting at age 29.  Mammogram. This may be done every 1-2 years. Talk to your health care provider about how often you should have regular mammograms. Talk with your health care provider about your test results, treatment options, and if necessary, the need for more tests. Vaccines  Your health care provider may recommend certain vaccines, such as:  Influenza vaccine. This is recommended every year.  Tetanus, diphtheria, and acellular pertussis (Tdap, Td)  vaccine. You may need a Td booster every 10 years.  Zoster vaccine. You may need this after age 31.  Pneumococcal 13-valent conjugate (PCV13) vaccine. One dose is recommended after age 59.  Pneumococcal polysaccharide (PPSV23) vaccine. One dose is recommended after age 49.  Talk to your health care provider about which screenings and vaccines you need and how often you need them. This information is not intended to replace advice given to you by your health care provider. Make sure you discuss any questions you have with your health care provider. Document Released: 03/09/2015 Document Revised: 10/31/2015 Document Reviewed: 12/12/2014 Elsevier Interactive Patient Education  2017 [employer] Prevention in the Home Falls can cause injuries. They can happen to people of all ages. There are many things you can do to make your home safe and to help prevent falls. What can I do on the outside of my home?  Regularly fix the edges of walkways and driveways and fix any cracks.  Remove anything that might make you trip as you walk through a door, such as a raised step or threshold.  Trim any bushes or trees on the path to your home.  Use bright outdoor lighting.  Clear any walking paths of anything that might make someone trip, such as rocks or tools.  Regularly check to see if handrails are loose or broken. Make sure that both sides of any steps have handrails.  Any raised decks and porches should have guardrails on the edges.  Have any leaves, snow, or ice cleared regularly.  Use sand or salt on walking paths during winter.  Clean up any spills in your garage right away. This includes oil or grease spills. What can I do in the bathroom?  Use night lights.  Install grab bars by the toilet and in the tub and shower. Do not use towel bars as grab bars.  Use non-skid mats or decals in the tub or shower.  If you need to sit down in the shower, use a plastic, non-slip stool.  Keep the floor dry. Clean up any water that spills on the floor as soon as it happens.  Remove soap buildup in the tub or shower regularly.  Attach bath mats securely with double-sided non-slip rug tape.  Do not have throw rugs and other things on the floor that can make you  trip. What can I do in the bedroom?  Use night lights.  Make sure that you have a light by your bed that is easy to reach.  Do not use any sheets or blankets that are too big for your bed. They should not hang down onto the floor.  Have a firm chair that has side arms. You can use this for support while you get dressed.  Do not have throw rugs and other things on the floor that can make you trip. What can I do in the kitchen?  Clean up any spills right away.  Avoid walking on wet floors.  Keep items that you use a lot in easy-to-reach places.  If you need to reach something above you, use a strong step stool that has a grab bar.  Keep electrical cords out of the way.  Do not use floor polish or wax that makes floors slippery. If you must use wax, use non-skid floor wax.  Do not have throw rugs and other things on the floor that can make you trip. What can I do with my stairs?  Do not leave  any items on the stairs.  Make sure that there are handrails on both sides of the stairs and use them. Fix handrails that are broken or loose. Make sure that handrails are as long as the stairways.  Check any carpeting to make sure that it is firmly attached to the stairs. Fix any carpet that is loose or worn.  Avoid having throw rugs at the top or bottom of the stairs. If you do have throw rugs, attach them to the floor with carpet tape.  Make sure that you have a light switch at the top of the stairs and the bottom of the stairs. If you do not have them, ask someone to add them for you. What else can I do to help prevent falls?  Wear shoes that:  Do not have high heels.  Have rubber bottoms.  Are comfortable and fit you well.  Are closed at the toe. Do not wear sandals.  If you use a stepladder:  Make sure that it is fully opened. Do not climb a closed stepladder.  Make sure that both sides of the stepladder are locked into place.  Ask someone to hold it for you, if  possible.  Clearly mark and make sure that you can see:  Any grab bars or handrails.  First and last steps.  Where the edge of each step is.  Use tools that help you move around (mobility aids) if they are needed. These include:  Canes.  Walkers.  Scooters.  Crutches.  Turn on the lights when you go into a dark area. Replace any light bulbs as soon as they burn out.  Set up your furniture so you have a clear path. Avoid moving your furniture around.  If any of your floors are uneven, fix them.  If there are any pets around you, be aware of where they are.  Review your medicines with your doctor. Some medicines can make you feel dizzy. This can increase your chance of falling. Ask your doctor what other things that you can do to help prevent falls. This information is not intended to replace advice given to you by your health care provider. Make sure you discuss any questions you have with your health care provider. Document Released: 12/07/2008 Document Revised: 07/19/2015 Document Reviewed: 03/17/2014 Elsevier Interactive Patient Education  2017 [employer].

## 2018-11-18 DIAGNOSIS — H43812 Vitreous degeneration, left eye: Secondary | ICD-10-CM | POA: Diagnosis not present

## 2018-12-01 ENCOUNTER — Other Ambulatory Visit: Payer: Self-pay | Admitting: Sports Medicine

## 2018-12-07 ENCOUNTER — Other Ambulatory Visit: Payer: Self-pay

## 2018-12-07 ENCOUNTER — Ambulatory Visit (INDEPENDENT_AMBULATORY_CARE_PROVIDER_SITE_OTHER): Payer: Medicare HMO | Admitting: Sports Medicine

## 2018-12-07 DIAGNOSIS — M47812 Spondylosis without myelopathy or radiculopathy, cervical region: Secondary | ICD-10-CM | POA: Diagnosis not present

## 2018-12-07 NOTE — Progress Notes (Signed)
Headache  Now has gone 10 mornings with no neck pain or headache Was having 24 hours of headache For first 20 days this month had morning HA  ROS Drowsy for 3 to 4 days Now no side-effects No weakness in hands or arms   PE [name] in NAD BP 108/68   Ht 5\' 6"  (1.676 m)   Wt 183 lb (83 kg)   LMP 02/25/1995 (Approximate)   BMI 29.54 kg/m   ROM limited in extension to ~ 20 deg Flexion is almost full Better rotation to RT - ~ 30 deg. Rotation to left 20 deg Lateral bending only mild limits  Trapezius tightness is much improved  C5 - T1 strength normal DTRs normal

## 2018-12-07 NOTE — Assessment & Plan Note (Signed)
We will continue on increased dose of Doxepin as this has helped Consider going to 25 bid and 100 hs if headaches return  Continue work on isometric exercises Walking and moderate exercise  Reck 3 mos

## 2018-12-15 ENCOUNTER — Encounter: Payer: Self-pay | Admitting: Neurology

## 2018-12-15 ENCOUNTER — Ambulatory Visit (INDEPENDENT_AMBULATORY_CARE_PROVIDER_SITE_OTHER): Payer: Medicare HMO | Admitting: Neurology

## 2018-12-15 ENCOUNTER — Other Ambulatory Visit: Payer: Self-pay

## 2018-12-15 VITALS — BP 122/77 | HR 91 | Ht 66.0 in | Wt 185.0 lb

## 2018-12-15 DIAGNOSIS — W19XXXD Unspecified fall, subsequent encounter: Secondary | ICD-10-CM

## 2018-12-15 DIAGNOSIS — G43101 Migraine with aura, not intractable, with status migrainosus: Secondary | ICD-10-CM | POA: Diagnosis not present

## 2018-12-15 DIAGNOSIS — Z7189 Other specified counseling: Secondary | ICD-10-CM | POA: Diagnosis not present

## 2018-12-15 DIAGNOSIS — Z9989 Dependence on other enabling machines and devices: Secondary | ICD-10-CM

## 2018-12-15 DIAGNOSIS — G4733 Obstructive sleep apnea (adult) (pediatric): Secondary | ICD-10-CM | POA: Diagnosis not present

## 2018-12-15 NOTE — Progress Notes (Signed)
SLEEP MED[facility]INE [facility]IC   Provider:  Larey Seat, M D  Primary Care Physician:  Gayland Curry, DO   Referring Provider: Gayland Curry, DO   Chief Complaint  Patient presents with  . Follow-up    pt alone, rm 10. pt states machine working well. DME [facility]    HPI:  [name] is a 78 y.o. female , seen here  For CPAP compliance   HISTORY:  [name] reports improvement in headaches after Dr. Oneida Alar has started her on doxepin. She sleeps well, her headaches are gone. CPAP : She went to [place] and used machine every single night.  EPWORTH 1.  [name] has used her machine daily for the last 30 days including 08 December 2018, average use at time 8 hours 58 minutes, her minimum pressure setting is for maximum pressure setting 10 cmH2O this 2 cm expiratory pressure relief and a resulting AHI of 2.5 there is no air leak.  The resulting AHI is mostly related to obstructive apneas.  The 95th percentile pressure is at 9.2 cmH2O, which makes me think that we need to increase her maximum pressure setting x1 cm the new window will be for up to 11 cmH2O is 2 cm EPR.     08-11-2018:[name] is a 78 y.o. female , seen here for a new concern about a recent fall- retropusive, hit her head on a paved brick pavement, all this happened on June 6th at her husband's high school reunion in [place], [place].  Patient reports that she initially felt not as much pain, she has spit up by herself went to her bedroom and rested, but after 2 or 3 days she developed a headache associated with some sickness to her stomach and some photophobia but must be migrainous in origin.  She did not break any bones, her pupils are of similar size, and she does not have a focal weakness or sensory abnormality. She hasn't had a migraine for almost one year.  She is not dizzy- but walks with sunglasses.  She needs some intervention  for her migraine and a CT of the had to rule out any bleed.   04-15-2017, I have  the pleasure of meeting with [name] today, for a regular CPAP CPAP compliance visit.  The patient now uses an AutoSet between 4 and 10 cmH2O pressure was 1 cm EPR, she has achieved a compliance of 100% with an average use at time of 9 hours and 10 minutes each night.  Her residual AHI is 1.5/h, which is a very good resolution.  95th percentile pressure is 9.7 cm water air leaks are medium. Epworth 2, FSS 41.  She loves the new CPAP auto- machine.  Here is the result of her sleep study:   NAME:  [name]                                                      DOB: February 02, 1941 MEDICAL RECORD NUMBER [id number]                                 DOS: 01/31/2017 REFERRING PHYSICIAN: Hollace Kinnier, D.O. BMI: 30.6  STUDY RESULTS: Total Recording Time: 10 hours, 51 minutes,  Total Apnea/Hypopnea Index (AHI):  8.2/ hr. RDI 10.5 /hr.  Average Oxygen Saturation:    92 % Lowest Oxygen Desaturation:  82 %; Total Time Oxygen Saturation below 89%:  19 minutes Average Heart Rate:   64 bpm (40-161 bpm).  IMPRESSION: Mild sleep apnea with snoring, desaturation and heart rate variability = AHI 8.2/hr .  Continued CPAP therapy is indicated.  I ordered  Auto CPAP 4-10 cm water, 3 cm EPR, heated humidity and fit for dream wear interface.    I certify that I have reviewed the raw data recording prior to the issuance of this report in accordance with the standards of the American Academy of Sleep Medicine (AASM).  Larey Seat, MD     Medical Director of [facility], and accredited by [employer] of the ABPN and ABSM   Epworth score  2 , Fatigue severity score 41    Social History   Socioeconomic History  . Marital status: Married    Spouse name: [name]  . Number of children: 0  . Years of education: 72  . Highest education level: Not on file  Occupational History  . Not on file  Social Needs  . Financial resource strain: Not hard at all  . Food insecurity    Worry: Never true     Inability: Never true  . Transportation needs    Medical: No    Non-medical: No  Tobacco Use  . Smoking status: Passive Smoke Exposure - Never Smoker  . Smokeless tobacco: Never Used  . 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 Alcohol use: Yes    Alcohol/week: 0.0 standard drinks    Comment: 1 glass - occas.  . Drug use: No  . Sexual activity: Not Currently    Partners: Male    Birth control/protection: Post-menopausal  Lifestyle  . Physical activity    Days per week: 4 days    Minutes per session: 60 min  . Stress: Only a little  Relationships  . Social connections    Talks on phone: More than three times a week    Gets together: More than three times a week    Attends religious service: More than 4 times per year    Active member of club or organization: No    Attends meetings of clubs or organizations: Never    Relationship status: Married  . Intimate partner violence    Fear of current or ex partner: No    Emotionally abused: No    Physically abused: No    Forced sexual activity: No  Other Topics Concern  . Not on file  Social History Narrative   Patient is married [name]).   Patient drinks 2-4 cups of coffee daily.   Patient is retired.   Patient has two years of college.   Patient is right-handed.                Family History  Problem Relation Age of Onset  . Breast cancer Mother   . Aneurysm Father   . Migraines Father   . Breast cancer Maternal Aunt   . Breast cancer Maternal Aunt   . Breast cancer Maternal Aunt   . Breast cancer Maternal Aunt   . High blood pressure Brother   . Melanoma Brother   . Kidney cancer Brother        Lesion on kidney  . Heart disease Other        Grandmother  Past Medical History:  Diagnosis Date  . Cancer ([place])    basal cell skin biopsies X2  . 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 Constipation   . Depression   . Fibromyalgia 8/98   [place]  . 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 Impaired hearing    left ear, wears hearing aids  . Insomnia    circadian rhythm component  . Insomnia   . Migraine 10/88   Spillman  . Nuclear sclerosis   . OSA on CPAP 2/07   uses cpap setting of 10  . Osteoarthritis 2007   [place]war  . Pain last week   left under breast pain   . Plantar fasciitis   . Scarlet fever as child  . Thyroid disorder   . Vitreous degeneration of right eye     Past Surgical History:  Procedure Laterality Date  . BREAST BIOPSY Left 6/00   [place]  . BREAST EXCISIONAL BIOPSY Left 1998  . DE QUERVAIN'S RELEASE Right 10/97   [place]  . left breast biopsy    . ROOT CANAL  02/11/2012  . TONSILLECTOMY  age 71  . TONSILLECTOMY AND ADENOIDECTOMY  1952  . 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 TOTAL KNEE ARTHROPLASTY Right 11/08/2012   Procedure: RIGHT TOTAL KNEE ARTHROPLASTY;  Surgeon: Gearlean Alf, MD;  Location: [facility];  Service: Orthopedics;  Laterality: Right;  . TUBAL LIGATION      Current Outpatient Medications  Medication Sig Dispense Refill  . aspirin EC 81 MG tablet Take 81 mg by mouth daily.    . Aspirin-Acetaminophen-Caffeine (EXCEDRIN MIGRAINE PO) Take 2 capsules by mouth as needed.     . celecoxib (CELEBREX) 200 MG capsule TAKE 1 CAPSULE BY MOUTH EVERY DAY 90 capsule 3  . Cholecalciferol (VITAMIN D3) 5000 units CAPS Take by mouth daily.     Marland Kitchen doxepin (SINEQUAN) 100 MG capsule Take 1 capsule (100 mg total) by mouth at bedtime. 90 capsule 12  . doxepin (SINEQUAN) 25 MG capsule TAKE 1 CAPULE BY MOUTH AT 4-5 PM DAILY 90 capsule 3  . Eszopiclone (ESZOPICLONE) 3 MG TABS Take 3 mg by mouth at bedtime. Take immediately before bedtime    . levothyroxine (SYNTHROID, LEVOTHROID) 200 MCG tablet Take 200 mcg by mouth daily before breakfast.    . LORazepam (ATIVAN) 1 MG tablet Take 1 mg by mouth 2 (two) times daily.     . Melatonin 5 MG TABS Take 15 mg by mouth at bedtime.     . montelukast (SINGULAIR) 10 MG  tablet Take 10 mg by mouth every evening.  3  . polyethylene glycol (MIRALAX / GLYCOLAX) packet Take 17 g by mouth daily.    . traMADol (ULTRAM) 50 MG tablet Take 1 tablet (50 mg total) by mouth 3 (three) times daily as needed. 90 tablet 3  . valACYclovir (VALTREX) 500 MG tablet TAKE 1 TABLET EVERY DAY INCREASE TO 1 TABLET TWICE DAILY FOR 3 DAYS AS NEEDED 100 tablet 1   No current facility-administered medications for this visit.     Allergies as of 12/15/2018 - Review Complete 12/15/2018  Allergen Reaction Noted  . Penicillins Anaphylaxis 04/29/2011  . Codeine Nausea Only 04/29/2011  . Provigil [modafinil]  03/24/2013  . Seroquel [quetiapine fumarate]  07/20/2014  . Xyrem [oxybate]  06/18/2011  . Ciprofloxacin Rash 07/01/2012  . Monistat [miconazole] Rash 07/01/2012  . Terazol [terconazole] Rash 07/01/2012    Vitals: BP 122/77   Pulse 91  Ht 5\' 6"  (1.676 m)   Wt 185 lb (83.9 kg)   LMP 02/25/1995 (Approximate)   BMI 29.86 kg/m  Last Weight:  Wt Readings from Last 1 Encounters:  12/15/18 185 lb (83.9 kg)   [id number] mass index is 29.86 kg/m.     Last Height:   Ht Readings from Last 1 Encounters:  12/15/18 5\' 6"  (1.676 m)    Physical exam:  General: The patient is awake, alert and appears not in acute distress. The patient is well groomed. Head: Normocephalic, atraumatic. Neck is supple. Mallampati 4  neck circumference:15.5 . Nasal airflow patent.  Cardiovascular:  Regular rate and rhythm , without  murmurs or carotid bruit, and without distended neck veins. Respiratory: Lungs are clear to auscultation. Skin:  Without evidence of edema, or rash Trunk: BMI is  30.18- .   Neurologic exam : The patient is awake and alert, oriented to place and time.   Memory subjective described as intact.  Memory testing revealed   MOCA:No flowsheet data found. MMSE: MMSE - Mini Mental State Exam 10/28/2017 09/23/2016 08/31/2015  Orientation to time 5 5 4   Orientation to Place 5 5 5    Registration 3 3 3   Attention/ Calculation 5 5 4   Recall 2 2 3   Language- name 2 objects 2 2 2   Language- repeat 1 1 1   Language- follow 3 step command 3 3 1   Language- read & follow direction 1 1 1   Write a sentence 1 1 1   Copy design 1 1 1   Total score 29 29 26        Attention span & concentration ability appears normal.  Speech is fluent, Mood and affect are appropriate.  Cranial nerves: Pupils are equal and briskly reactive to light. Funduscopic exam without evidence of pallor or edema. Extraocular movements  in vertical and horizontal planes intact and without nystagmus. Visual fields by finger perimetry are intact. Hearing to finger rub intact.  Facial sensation intact to fine touch. Facial motor strength is symmetric and tongue and uvula move midline. Shoulder shrug was symmetrical. Motor exam:  Normal tone, muscle bulk and symmetric strength in all extremities. Sensory:  Fine touch, pinprick and vibration were tested in all extremities. Proprioception tested in the upper extremities was normal. Coordination: Rapid alternating movements in the fingers/hands was normal. Finger-to-nose maneuver  normal without evidence of ataxia, dysmetria or tremor. Gait and station: Patient walks without assistive device . Deep tendon reflexes: in the  upper and lower extremities are symmetric and intact. Babinski maneuver response is downgoing.    Assessment:  After physical and neurologic examination, review of laboratory studies,  Personal review of imaging studies, reports of other /same  Imaging studies, results of polysomnography and / or neurophysiology testing and pre-existing records as far as provided in visit., my assessment is   1) Mrs. Comi has used her machine daily for the last 30 days including 08 December 2018, average use at time 8 hours 58 minutes, her minimum pressure setting is for maximum pressure setting 10 cmH2O this 2 cm expiratory pressure relief and a resulting AHI of  2.5 there is no air leak.  The resulting AHI is mostly related to obstructive apneas.  The 95th percentile pressure is at 9.2 cmH2O, which makes me think that we need to increase her maximum pressure setting x1 cm the new window will be for up to 11 cmH2O is 2 cm EPR.    The patient was advised of the nature of the diagnosed  disorder , the treatment options and the  risks for general health and wellness arising from not treating the condition.  I spent more than 20 minutes of face to face time with the patient. Greater than 50% of time was spent in counseling and coordination of care. We have discussed the diagnosis and differential and I answered the patient's questions.    Plan:  Treatment plan and additional workup : continue using CPAP with the adjusted settings. Marland Kitchen     Larey Seat, MD   [id number], [phone] PM  Certified in Neurology by ABPN Certified in [place] by [facility] [address] [place], [place] [address]

## 2018-12-15 NOTE — Patient Instructions (Signed)
Doxepin capsules What is this medicine? DOXEPIN (DOX e pin) is used to treat depression and anxiety. This medicine may be used for other purposes; ask your health care provider or pharmacist if you have questions. COMMON BRAND NAME(S): Sinequan What should I tell my health care provider before I take this medicine? They need to know if you have any of these conditions:  bipolar disorder  difficulty passing urine  glaucoma  heart disease  if you frequently drink alcohol containing drinks  liver disease  lung or breathing disease, like asthma or sleep apnea  prostate trouble  schizophrenia  seizures  suicidal thoughts, plans, or attempt; a previous suicide attempt by you or a family member  an unusual or allergic reaction to doxepin, other medicines, foods, dyes, or preservatives  pregnant or trying to get pregnant  breast-feeding How should I use this medicine? Take this medicine by mouth with a glass of water. Follow the directions on the prescription label. Take your doses at regular intervals. Do not take your medicine more often than directed. Do not stop taking this medicine suddenly except upon the advice of your doctor. Stopping this medicine too quickly may cause serious side effects or your condition may worsen. A special MedGuide will be given to you by the pharmacist with each prescription and refill. Be sure to read this information carefully each time. Talk to your pediatrician regarding the use of this medicine in children. While this drug may be prescribed for children as young as 12 years for selected conditions, precautions do apply. Overdosage: If you think you have taken too much of this medicine contact a poison control center or emergency room at once. NOTE: This medicine is only for you. Do not share this medicine with others. What if I miss a dose? If you miss a dose, take it as soon as you can. If it is almost time for your next dose, take only that  dose. Do not take double or extra doses. What may interact with this medicine? Do not take this medicine with any of the following medications:  arsenic trioxide  certain medicines used to regulate abnormal heartbeat or to treat other heart conditions  cisapride  halofantrine  levomethadyl  linezolid  MAOIs like Carbex, Eldepryl, Marplan, Nardil, and Parnate  methylene blue  other medicines for mental depression  phenothiazines like perphenazine, thioridazine and chlorpromazine  pimozide  procarbazine  sparfloxacin  St. John's Wort This medicine may also interact with the following medications:  cimetidine  tolazamide  ziprasidone This list may not describe all possible interactions. Give your health care provider a list of all the medicines, herbs, non-prescription drugs, or dietary supplements you use. Also tell them if you smoke, drink alcohol, or use illegal drugs. Some items may interact with your medicine. What should I watch for while using this medicine? Visit your doctor or health care professional for regular checks on your progress. It can take several days before you feel the full effect of this medicine. If you have been taking this medicine regularly for some time, do not suddenly stop taking it. You must gradually reduce the dose or you may get severe side effects. Ask your doctor or health care professional for advice. Even after you stop taking this medicine it can still affect your body for several days. Patients and their families should watch out for new or worsening thoughts of suicide or depression. Also watch out for sudden changes in feelings such as feeling anxious, agitated, panicky,   irritable, hostile, aggressive, impulsive, severely restless, overly excited and hyperactive, or not being able to sleep. If this happens, especially at the beginning of treatment or after a change in dose, call your health care professional. You may get drowsy or  dizzy. Do not drive, use machinery, or do anything that needs mental alertness until you know how this medicine affects you. Do not stand or sit up quickly, especially if you are an older patient. This reduces the risk of dizzy or fainting spells. Alcohol may increase dizziness and drowsiness. Avoid alcoholic drinks. Do not treat yourself for coughs, colds, or allergies without asking your doctor or health care professional for advice. Some ingredients can increase possible side effects. Your mouth may get dry. Chewing sugarless gum or sucking hard candy, and drinking plenty of water may help. Contact your doctor if the problem does not go away or is severe. This medicine may cause dry eyes and blurred vision. If you wear contact lenses you may feel some discomfort. Lubricating drops may help. See your eye doctor if the problem does not go away or is severe. This medicine can make you more sensitive to the sun. Keep out of the sun. If you cannot avoid being in the sun, wear protective clothing and use sunscreen. Do not use sun lamps or tanning beds/booths. What side effects may I notice from receiving this medicine? Side effects that you should report to your doctor or health care professional as soon as possible:  allergic reactions like skin rash, itching or hives, swelling of the face, lips, or tongue  anxious  breathing problems  changes in vision  confusion  elevated mood, decreased need for sleep, racing thoughts, impulsive behavior  eye pain  fast, irregular heartbeat  feeling faint or lightheaded, falls  feeling agitated, angry, or irritable  fever with increased sweating  hallucination, loss of contact with reality  seizures  stiff muscles  suicidal thoughts or other mood changes  tingling, pain, or numbness in the feet or hands  trouble passing urine or change in the amount of urine  trouble sleeping  unusually weak or tired  vomiting  yellowing of the eyes  or skin Side effects that usually do not require medical attention (report to your doctor or health care professional if they continue or are bothersome):  change in sex drive or performance  change in appetite or weight  constipation  dizziness  dry mouth  nausea  tired  tremors  upset stomach This list may not describe all possible side effects. Call your doctor for medical advice about side effects. You may report side effects to FDA at 1-800-FDA-1088. Where should I keep my medicine? Keep out of the reach of children. Store at room temperature between 15 and 30 degrees C (59 and 86 degrees F). Throw away any unused medicine after the expiration date. NOTE: This sheet is a summary. It may not cover all possible information. If you have questions about this medicine, talk to your doctor, pharmacist, or health care provider.  2020 Elsevier/Gold Standard (2018-02-02 13:13:29)  

## 2018-12-16 NOTE — Progress Notes (Signed)
Community message from G [place]: I have pulled this order.

## 2018-12-21 DIAGNOSIS — M859 Disorder of bone density and structure, unspecified: Secondary | ICD-10-CM | POA: Diagnosis not present

## 2018-12-21 DIAGNOSIS — E038 Other specified hypothyroidism: Secondary | ICD-10-CM | POA: Diagnosis not present

## 2018-12-21 DIAGNOSIS — E7849 Other hyperlipidemia: Secondary | ICD-10-CM | POA: Diagnosis not present

## 2018-12-22 ENCOUNTER — Other Ambulatory Visit: Payer: Self-pay | Admitting: Orthopedic Surgery

## 2018-12-22 ENCOUNTER — Other Ambulatory Visit (HOSPITAL_COMMUNITY): Payer: Self-pay | Admitting: Orthopedic Surgery

## 2018-12-22 DIAGNOSIS — M25562 Pain in left knee: Secondary | ICD-10-CM | POA: Diagnosis not present

## 2018-12-22 DIAGNOSIS — H43812 Vitreous degeneration, left eye: Secondary | ICD-10-CM | POA: Diagnosis not present

## 2018-12-22 DIAGNOSIS — Z96653 Presence of artificial knee joint, bilateral: Secondary | ICD-10-CM | POA: Diagnosis not present

## 2018-12-24 DIAGNOSIS — G4733 Obstructive sleep apnea (adult) (pediatric): Secondary | ICD-10-CM | POA: Diagnosis not present

## 2018-12-24 DIAGNOSIS — E785 Hyperlipidemia, unspecified: Secondary | ICD-10-CM | POA: Diagnosis not present

## 2018-12-24 DIAGNOSIS — E559 Vitamin D deficiency, unspecified: Secondary | ICD-10-CM | POA: Diagnosis not present

## 2018-12-24 DIAGNOSIS — G43909 Migraine, unspecified, not intractable, without status migrainosus: Secondary | ICD-10-CM | POA: Diagnosis not present

## 2018-12-24 DIAGNOSIS — M5090 Cervical disc disorder, unspecified, unspecified cervical region: Secondary | ICD-10-CM | POA: Diagnosis not present

## 2018-12-24 DIAGNOSIS — E038 Other specified hypothyroidism: Secondary | ICD-10-CM | POA: Diagnosis not present

## 2018-12-31 ENCOUNTER — Ambulatory Visit (HOSPITAL_COMMUNITY)
Admission: RE | Admit: 2018-12-31 | Discharge: 2018-12-31 | Disposition: A | Discharge status: Home / Self Care | Payer: Medicare HMO | Source: Ambulatory Visit | Attending: Orthopedic Surgery | Admitting: Orthopedic Surgery

## 2018-12-31 ENCOUNTER — Encounter (HOSPITAL_COMMUNITY)
Admission: RE | Admit: 2018-12-31 | Discharge: 2018-12-31 | Disposition: A | Discharge status: Home / Self Care | Payer: Medicare HMO | Source: Ambulatory Visit | Attending: Orthopedic Surgery | Admitting: Orthopedic Surgery

## 2018-12-31 ENCOUNTER — Other Ambulatory Visit: Payer: Self-pay

## 2018-12-31 DIAGNOSIS — M25562 Pain in left knee: Secondary | ICD-10-CM | POA: Diagnosis present

## 2018-12-31 DIAGNOSIS — M7989 Other specified soft tissue disorders: Secondary | ICD-10-CM | POA: Diagnosis not present

## 2018-12-31 MED ORDER — TECHNETIUM TC 99M MEDRONATE IV KIT
19.1000 | PACK | Freq: Once | INTRAVENOUS | Status: AC
  Administered 2018-12-31: 19.1 via INTRAVENOUS

## 2019-01-07 DIAGNOSIS — R69 Illness, unspecified: Secondary | ICD-10-CM | POA: Diagnosis not present

## 2019-02-04 DIAGNOSIS — G4733 Obstructive sleep apnea (adult) (pediatric): Secondary | ICD-10-CM | POA: Diagnosis not present

## 2019-02-11 DIAGNOSIS — M79672 Pain in left foot: Secondary | ICD-10-CM | POA: Diagnosis not present

## 2019-02-14 DIAGNOSIS — Z03818 Encounter for observation for suspected exposure to other biological agents ruled out: Secondary | ICD-10-CM | POA: Diagnosis not present

## 2019-02-19 ENCOUNTER — Other Ambulatory Visit: Payer: Self-pay | Admitting: Sports Medicine

## 2019-02-21 ENCOUNTER — Other Ambulatory Visit: Payer: Self-pay | Admitting: *Deleted

## 2019-02-21 MED ORDER — TRAMADOL HCL 50 MG PO TABS: 50.0000 mg | ORAL_TABLET | Freq: Three times a day (TID) | ORAL | 3 refills | Status: DC | PRN

## 2019-03-08 ENCOUNTER — Ambulatory Visit: Payer: Medicare HMO | Admitting: Sports Medicine

## 2019-03-09 ENCOUNTER — Encounter: Payer: Self-pay | Admitting: Internal Medicine

## 2019-03-09 ENCOUNTER — Telehealth: Payer: Self-pay

## 2019-03-09 DIAGNOSIS — Z03818 Encounter for observation for suspected exposure to other biological agents ruled out: Secondary | ICD-10-CM | POA: Diagnosis not present

## 2019-03-09 NOTE — Telephone Encounter (Signed)
Patient called to ask for how she could get Covid test as Dr. Mariea Clonts had said she should get and I gave her the number Dr. Mariea Clonts had included in an e-mail yesterday and told her the note Dr. Mariea Clonts had in the system also had that she wanted her to do a tele-visit with one of the nurse practitioners here at the office she said she would call back after she had scheduled her Covid test as she didn't want the to visits to interfere with each other

## 2019-03-09 NOTE — Telephone Encounter (Signed)
Pt called wanting to do a telephone call with a nurse, and I explained to pt that it is with a NP , pt stated she doesn't have time to wait because she gets a covid test at 3:30. Per pt she wanted to let Dr. Mariea Clonts know.

## 2019-03-10 ENCOUNTER — Other Ambulatory Visit: Payer: Self-pay

## 2019-03-10 ENCOUNTER — Telehealth (INDEPENDENT_AMBULATORY_CARE_PROVIDER_SITE_OTHER): Payer: Medicare HMO | Admitting: Internal Medicine

## 2019-03-10 ENCOUNTER — Encounter: Payer: Self-pay | Admitting: Internal Medicine

## 2019-03-10 ENCOUNTER — Ambulatory Visit: Payer: Medicare HMO | Admitting: Internal Medicine

## 2019-03-10 ENCOUNTER — Other Ambulatory Visit: Payer: Self-pay | Admitting: Internal Medicine

## 2019-03-10 ENCOUNTER — Ambulatory Visit: Payer: Medicare HMO | Admitting: Sports Medicine

## 2019-03-10 DIAGNOSIS — U071 COVID-19: Secondary | ICD-10-CM

## 2019-03-10 MED ORDER — ALBUTEROL SULFATE HFA 108 (90 BASE) MCG/ACT IN AERS: 2.0000 | INHALATION_SPRAY | Freq: Four times a day (QID) | RESPIRATORY_TRACT | 1 refills | Status: DC | PRN

## 2019-03-10 NOTE — Telephone Encounter (Signed)
Message routed to [employer], DO  Patient had a video visit with Dr.Reed today and this appears to be ongoing conversation

## 2019-03-10 NOTE — Telephone Encounter (Signed)
Please see note from the pharmacy below:  Changes Requested   levalbuterol (XOPENEX HFA) 45 MCG/ACT inhaler       Changed from: albuterol (VENTOLIN HFA) 108 (90 Base) MCG/ACT inhaler   Sig: Please specify directions, refills and quantity   Disp:  1 Inhaler  Refills:  0   Start: 03/10/2019   Class: Normal   Non-formulary For: COVID-19 virus infection   Last ordered: Today by Gayland Curry, DO Last refill: 03/10/2019   Rx #: [id number]   Pharmacy comment: Alternative Requested:INS REQUIRES LEVALBUTEROL.

## 2019-03-10 NOTE — Patient Instructions (Addendum)
Quarantine for 14 days from first symptoms. Mucinex for congestion.   Try to get a pulse oximeter to check your oxygen  Use the incentive spirometer 10x per day Recommended walking as exercise Discussed good hygiene practices with cleaning with wipes.   Call back if you get worse Proceed to the ED if you get chest tightness, difficulty breathing, blue fingers, toes or lips or you test your oxygen and it's less than 90% of room air.   Vitamin D3 4000 units po daily x 14 days Vitamin C 500mg  po daily x 14 days Zinc 100mg  po daily x 14 days Quercetin 250mg  po bid x 14 days Albuterol HFA with spacer 2 puffs q6h prn sob/wheezing  Follow Up Instructions: See above. Call back if getting worse ER if breathing declines or sats less than 90%     Person Under Monitoring Name: [name]  Location: [place] [address]   Infection Prevention Recommendations for Individuals Confirmed to have, or Being Evaluated for, 2019 Novel Coronavirus (COVID-19) Infection Who Receive Care at Home  Individuals who are confirmed to have, or are being evaluated for, COVID-19 should follow the prevention steps below until a healthcare provider or local or state health department says they can return to normal activities.  Stay home except to get medical care You should restrict activities outside your home, except for getting medical care. Do not go to work, school, or public areas, and do not use public transportation or taxis.  Call ahead before visiting your doctor Before your medical appointment, call the healthcare provider and tell them that you have, or are being evaluated for, COVID-19 infection. This will help the healthcare provider's office take steps to keep other people from getting infected. Ask your healthcare provider to call the local or state health department.  Monitor your symptoms Seek prompt medical attention if your illness is worsening (e.g., difficulty  breathing). Before going to your medical appointment, call the healthcare provider and tell them that you have, or are being evaluated for, COVID-19 infection. Ask your healthcare provider to call the local or state health department.  Wear a facemask You should wear a facemask that covers your nose and mouth when you are in the same room with other people and when you visit a healthcare provider. People who live with or visit you should also wear a facemask while they are in the same room with you.  Separate yourself from other people in your home As much as possible, you should stay in a different room from other people in your home. Also, you should use a separate bathroom, if available.  Avoid sharing household items You should not share dishes, drinking glasses, cups, eating utensils, towels, bedding, or other items with other people in your home. After using these items, you should wash them thoroughly with soap and water.  Cover your coughs and sneezes Cover your mouth and nose with a tissue when you cough or sneeze, or you can cough or sneeze into your sleeve. Throw used tissues in a lined trash can, and immediately wash your hands with soap and water for at least 20 seconds or use an alcohol-based hand rub.  Wash your [employer] [employer] your hands often and thoroughly with soap and water for at least 20 seconds. You can use an alcohol-based hand sanitizer if soap and water are not available and if your hands are not visibly dirty. Avoid touching your eyes, nose, and mouth with unwashed hands.  Prevention Steps for Caregivers and Household Members of Individuals Confirmed to have, or Being Evaluated for, COVID-19 Infection Being Cared for in the Home  If you live with, or provide care at home for, a person confirmed to have, or being evaluated for, COVID-19 infection please follow these guidelines to prevent infection:  Follow healthcare provider's instructions Make sure that you  understand and can help the patient follow any healthcare provider instructions for all care.  Provide for the patient's basic needs You should help the patient with basic needs in the home and provide support for getting groceries, prescriptions, and other personal needs.  Monitor the patient's symptoms If they are getting sicker, call his or her medical provider and tell them that the patient has, or is being evaluated for, COVID-19 infection. This will help the healthcare provider's office take steps to keep other people from getting infected. Ask the healthcare provider to call the local or state health department.  Limit the number of people who have contact with the patient  If possible, have only one caregiver for the patient.  Other household members should stay in another home or place of residence. If this is not possible, they should stay  in another room, or be separated from the patient as much as possible. Use a separate bathroom, if available.  Restrict visitors who do not have an essential need to be in the home.  Keep older adults, very young children, and other sick people away from the patient Keep older adults, very young children, and those who have compromised immune systems or chronic health conditions away from the patient. This includes people with chronic heart, lung, or kidney conditions, diabetes, and cancer.  Ensure good ventilation Make sure that shared spaces in the home have good air flow, such as from an air conditioner or an opened window, weather permitting.  Wash your hands often  Wash your hands often and thoroughly with soap and water for at least 20 seconds. You can use an alcohol based hand sanitizer if soap and water are not available and if your hands are not visibly dirty.  Avoid touching your eyes, nose, and mouth with unwashed hands.  Use disposable paper towels to dry your hands. If not available, use dedicated cloth towels and replace  them when they become wet.  Wear a facemask and gloves  Wear a disposable facemask at all times in the room and gloves when you touch or have contact with the patient's blood, body fluids, and/or secretions or excretions, such as sweat, saliva, sputum, nasal mucus, vomit, urine, or feces.  Ensure the mask fits over your nose and mouth tightly, and do not touch it during use.  Throw out disposable facemasks and gloves after using them. Do not reuse.  Wash your hands immediately after removing your facemask and gloves.  If your personal clothing becomes contaminated, carefully remove clothing and launder. Wash your hands after handling contaminated clothing.  Place all used disposable facemasks, gloves, and other waste in a lined container before disposing them with other household waste.  Remove gloves and wash your hands immediately after handling these items.  Do not share dishes, glasses, or other household items with the patient  Avoid sharing household items. You should not share dishes, drinking glasses, cups, eating utensils, towels, bedding, or other items with a patient who is confirmed to have, or being evaluated for, COVID-19 infection.  After the person uses these items, you should wash them thoroughly with soap and  water.  Wash laundry thoroughly  Immediately remove and wash clothes or bedding that have blood, body fluids, and/or secretions or excretions, such as sweat, saliva, sputum, nasal mucus, vomit, urine, or feces, on them.  Wear gloves when handling laundry from the patient.  Read and follow directions on labels of laundry or clothing items and detergent. In general, wash and dry with the warmest temperatures recommended on the label.  Clean all areas the individual has used often  Clean all touchable surfaces, such as counters, tabletops, doorknobs, bathroom fixtures, toilets, phones, keyboards, tablets, and bedside tables, every day. Also, clean any surfaces that  may have blood, body fluids, and/or secretions or excretions on them.  Wear gloves when cleaning surfaces the patient has come in contact with.  Use a diluted bleach solution (e.g., dilute bleach with 1 part bleach and 10 parts water) or a household disinfectant with a label that says EPA-registered for coronaviruses. To make a bleach solution at home, add 1 tablespoon of bleach to 1 quart (4 cups) of water. For a larger supply, add  cup of bleach to 1 gallon (16 cups) of water.  Read labels of cleaning products and follow recommendations provided on product labels. Labels contain instructions for safe and effective use of the cleaning product including precautions you should take when applying the product, such as wearing gloves or eye protection and making sure you have good ventilation during use of the product.  Remove gloves and wash hands immediately after cleaning.  Monitor yourself for signs and symptoms of illness Caregivers and household members are considered close contacts, should monitor their health, and will be asked to limit movement outside of the home to the extent possible. Follow the monitoring steps for close contacts listed on the symptom monitoring form.   ? If you have additional questions, contact your local health department or call the epidemiologist on call at [phone] (available 24/7). ? This guidance is subject to change. For the most up-to-date guidance from [facility], please refer to their website: [email or web address]

## 2019-03-10 NOTE — Progress Notes (Signed)
Virtual Visit via Video Note  I connected with [name] on 03/10/19 at  8:00 AM EST by a video enabled telemedicine application (mychart video visit) and verified that I am speaking with the correct person using two identifiers.  Location: Patient: Home Provider: [facility] office    I discussed the limitations of evaluation and management by telemedicine and the availability of in person appointments. The patient expressed understanding and agreed to proceed.  History of Present Illness: 79 yo female with h/o migraines, OSA on CPAP, opioid induced constipation, hypothyroidism, cervical DDD with radiculopathy, OA, recurrent falls, insomnia seen through video visit this morning due to covid-19.  She was tested yesterday afternoon.  She had a migraine that lasted a day and a half and nothing she did helped (Monday)  The next day it felt like her whole body was beaten.  Then she could not breathe when lying down.  She used her irrigation that unblocks her sinuses.  She then took a sudafed at [employer].  She's fearful of getting worse and not knowing what to do.  She thinks she's had it since Monday.  It's been harder to use her cpap but she has been.  She's not coughing.  She did have coughing the night of the headache.  She does feel fatigued moreso than normal.  Not dyspneic on exertion. No fever, chills, chest tightness.  No loss of taste or smell noted at this time.  Observations/Objective: Appears flushed, but no acute distress, AAOx3, irritated with her husband Not coughing Normal respiratory rate  Assessment and Plan: Quarantine for 14 days from first symptoms. Mucinex for congestion.   Try to get a pulse oximeter to check your oxygen  Use the incentive spirometer 10x per day Recommended walking as exercise Discussed good hygiene practices with cleaning with wipes.   Call back if you get worse Proceed to the ED if you get chest tightness, difficulty breathing, blue fingers, toes or  lips or you test your oxygen and it's less than 90% of room air.   Vitamin D3 4000 units po daily x 14 days Vitamin C 500mg  po daily x 14 days Zinc 100mg  po daily x 14 days Quercetin 250mg  po bid x 14 days Albuterol HFA with spacer 2 puffs q6h prn sob/wheezing  Follow Up Instructions: See above. Call back if getting worse ER if breathing declines or sats less than 90%  I discussed the assessment and treatment plan with the patient. The patient was provided an opportunity to ask questions and all were answered. The patient agreed with the plan and demonstrated an understanding of the instructions.   The patient was advised to call back or seek an in-person evaluation if the symptoms worsen or if the condition fails to improve as anticipated.  I provided 25 minutes of non-face-to-face time during this encounter.  Ruthetta Koopmann L. Huberta Tompkins, D.O. [facility] [address], [place] [address] Cell Phone (Mon-Fri 8am-5pm):  [phone] On Call:  [phone] & follow prompts after 5pm & weekends Office Phone:  [phone] Office Fax:  [phone]

## 2019-03-11 ENCOUNTER — Ambulatory Visit: Payer: Medicare HMO

## 2019-03-11 ENCOUNTER — Encounter: Payer: Self-pay | Admitting: Internal Medicine

## 2019-03-23 ENCOUNTER — Telehealth: Payer: Self-pay | Admitting: *Deleted

## 2019-03-23 NOTE — Telephone Encounter (Signed)
Pt calling asking for a letter that she had covid, I was going to print out her lab results, but it's not in her chart. Please advise

## 2019-03-23 NOTE — Telephone Encounter (Signed)
We need a little more information about why she needs it so I can write it properly.  Having a copy of her result in the chart would also be nice.

## 2019-03-23 NOTE — Telephone Encounter (Signed)
Pt needs this letter so she may go to her opthalmology appointment, pt states it must be 7 weeks old. Pt states she can not get [place] a copy of her results.

## 2019-03-23 NOTE — Telephone Encounter (Signed)
letter mailed to patients home address with results. Spoke with patient and advised results

## 2019-03-23 NOTE — Telephone Encounter (Signed)
Letter completed and printed out at [facility].

## 2019-03-28 ENCOUNTER — Encounter: Payer: Self-pay | Admitting: Internal Medicine

## 2019-04-03 ENCOUNTER — Encounter: Payer: Self-pay | Admitting: Neurology

## 2019-04-03 ENCOUNTER — Encounter: Payer: Self-pay | Admitting: Internal Medicine

## 2019-04-18 ENCOUNTER — Other Ambulatory Visit: Payer: Self-pay | Admitting: Obstetrics and Gynecology

## 2019-05-02 NOTE — Progress Notes (Signed)
79 y.o. G0P0 Married White or Caucasian Not Hispanic or Latino female here for annual exam. Patient states that she needs to have a mammogram she wants to know if Dr. Talbert Nan can authorize her to know her results before she leaves that day.   She is also suffering from vaginal dryness.  Some dysuria, frequency and urgency to urinate. Symptoms started about 1 week ago.   She has been having intermittent chest pain, she will contact her primary.    She had Covid in 1/21. Since then her energy level is down and she is having constant low level headache. She has been very stressed ever since Covid.   She request a refill for diflucan. She needs to have antibiotics for dental procedures this year and always gets a yeast infection (needs brand name).   Only one HSV outbreak.   Patient's last menstrual period was 02/25/1995 (approximate).          Sexually active: No.  The current method of family planning is vaginal spermicide and post menopausal status.    Exercising: Yes.    Pool exercise, walking 2 miles 3 times a week.  Smoker:  no  Health Maintenance: Pap:  02-14-16 WNL NEG HR HPV History of abnormal Pap:  Yes- years ago, no surgery on her cervix.  MMG:  05/13/18 density C Bi-rads 1 neg  BMD:  01/2017 Osteopenia, followed by endocrinologist. She was on fosamax in the past, she reports damage to her jaw bone. She has TMJ  Colonoscopy: 07/15/18 normal repeat in 5 years  TDaP:  10/10/2010 Gardasil: NA   reports that she is a non-smoker but has been exposed to tobacco smoke. She has been exposed to tobacco smoke for the past 30.00 years. She has never used smokeless tobacco. She reports current alcohol use. She reports that she does not use drugs. No ETOH.  Past Medical History:  Diagnosis Date  . Cancer ([place])    basal cell skin biopsies X2  . 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 Constipation   . Depression   . Fibromyalgia 8/98   [place]  . 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 Impaired hearing    left ear, wears hearing aids  . Insomnia    circadian rhythm component  . Insomnia   . Migraine 10/88   [place]  . Nuclear sclerosis   . OSA on CPAP 2/07   uses cpap setting of 10  . Osteoarthritis 2007   [place]  . Pain last week   left under breast pain   . Plantar fasciitis   . Scarlet fever as child  . Thyroid disorder   . Vitreous degeneration of right eye     Past Surgical History:  Procedure Laterality Date  . BREAST BIOPSY Left 6/00   [place]  . BREAST EXCISIONAL BIOPSY Left 1998  . DE QUERVAIN'S RELEASE Right 10/97   [place]  . left breast biopsy    . ROOT CANAL  02/11/2012  . TONSILLECTOMY  age 50  . TONSILLECTOMY AND ADENOIDECTOMY  1952  . 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 TOTAL KNEE ARTHROPLASTY Right 11/08/2012   Procedure: RIGHT TOTAL KNEE ARTHROPLASTY;  Surgeon: Gearlean Alf, MD;  Location: [facility];  Service: Orthopedics;  Laterality: Right;  . TUBAL LIGATION      Current Outpatient Medications  Medication Sig Dispense Refill  . aspirin EC 81 MG tablet Take 81 mg by mouth daily.    . Aspirin-Acetaminophen-Caffeine (EXCEDRIN MIGRAINE  PO) Take 2 capsules by mouth as needed.     . celecoxib (CELEBREX) 200 MG capsule TAKE 1 CAPSULE BY MOUTH EVERY DAY 90 capsule 3  . Cholecalciferol (VITAMIN D3) 5000 units CAPS Take by mouth daily.     Marland Kitchen doxepin (SINEQUAN) 100 MG capsule Take 1 capsule (100 mg total) by mouth at bedtime. 90 capsule 12  . doxepin (SINEQUAN) 25 MG capsule TAKE 1 CAPULE BY MOUTH AT 4-5 PM DAILY 90 capsule 3  . Eszopiclone (ESZOPICLONE) 3 MG TABS Take 3 mg by mouth at bedtime. Take immediately before bedtime    . levalbuterol (XOPENEX HFA) 45 MCG/ACT inhaler Inhale 2 puffs into the lungs every 6 (six) hours as needed for wheezing or shortness of breath. 1 Inhaler 1  . levothyroxine (SYNTHROID, LEVOTHROID) 200 MCG tablet Take 200 mcg by mouth daily before breakfast.    . LORazepam (ATIVAN) 1 MG tablet Take 1  mg by mouth 2 (two) times daily.     . Melatonin 5 MG TABS Take 15 mg by mouth at bedtime.     . montelukast (SINGULAIR) 10 MG tablet Take 10 mg by mouth every evening.  3  . polyethylene glycol (MIRALAX / GLYCOLAX) packet Take 17 g by mouth daily.    . traMADol (ULTRAM) 50 MG tablet Take 1 tablet (50 mg total) by mouth 3 (three) times daily as needed. 90 tablet 3  . valACYclovir (VALTREX) 500 MG tablet TAKE 1 TABLET EVERY DAY INCREASE TO 1 TABLET TWICE DAILY FOR 3 DAYS AS NEEDED 100 tablet 1   No current facility-administered medications for this visit.    Family History  Problem Relation Age of Onset  . Breast cancer Mother   . Aneurysm Father   . Migraines Father   . Breast cancer Maternal Aunt   . Breast cancer Maternal Aunt   . Breast cancer Maternal Aunt   . Breast cancer Maternal Aunt   . High blood pressure Brother   . Melanoma Brother   . Kidney cancer Brother        Lesion on kidney  . Heart disease Other        Grandmother    Review of Systems  Respiratory: Negative for stridor.   Genitourinary: Negative for dysuria.  Neurological: Positive for headaches.  All other systems reviewed and are negative. Rare urge incontinence, rare GSI, tolerable.   Exam:   LMP 02/25/1995 (Approximate)   Weight change: @WEIGHTCHANGE @ Height:      Ht Readings from Last 3 Encounters:  12/15/18 5\' 6"  (1.676 m)  12/07/18 5\' 6"  (1.676 m)  11/09/18 5\' 6"  (1.676 m)    General appearance: alert, cooperative and appears stated age Head: Normocephalic, without obvious abnormality, atraumatic Neck: no adenopathy, supple, symmetrical, trachea midline and thyroid normal to inspection and palpation Lungs: clear to auscultation bilaterally Cardiovascular: regular rate and rhythm Breasts: normal appearance, no masses or tenderness Abdomen: soft, non-tender; non distended,  no masses,  no organomegaly Extremities: extremities normal, atraumatic, no cyanosis or edema Skin: Skin color, texture,  turgor normal. No rashes or lesions Lymph nodes: Cervical, supraclavicular, and axillary nodes normal. No abnormal inguinal nodes palpated Neurologic: Grossly normal   Pelvic: External genitalia:  no lesions              Urethra:  normal appearing urethra with no masses, tenderness or lesions              Bartholins and Skenes: normal  Vagina: atrophic appearing vagina with normal color and discharge, no lesions              Cervix: no lesions               Bimanual Exam:  Uterus:  normal size, contour, position, consistency, mobility, non-tender              Adnexa: no mass, fullness, tenderness               Rectovaginal: Confirms               Anus:  normal sphincter tone, no lesions  Karmen Bongo chaperoned for the exam.  A:  Well Woman with normal exam  Needs antibiotics for dental procedure, requests refill of diflucan (needs brand name)  FH of breast cancer, declines genetics referral   Mild bladder symptoms for the last week, negative urine dip  Vaginal dryness, atrophy on exam  P:   No pap this year  Discussed breast self exam (she doesn't want to do them)  Discussed calcium and vit D intake  Mammogram due we will schedule  Colonoscopy UTD  DEXA due, she will schedule with Dr Forde Dandy  Send urine for ua, c&s  Can try replens vaginal moisturizer, coconut oil. Can use Vaseline externally.

## 2019-05-04 ENCOUNTER — Encounter: Payer: Self-pay | Admitting: Obstetrics and Gynecology

## 2019-05-04 ENCOUNTER — Other Ambulatory Visit: Payer: Self-pay | Admitting: Obstetrics and Gynecology

## 2019-05-04 ENCOUNTER — Other Ambulatory Visit: Payer: Self-pay

## 2019-05-04 ENCOUNTER — Telehealth: Payer: Self-pay

## 2019-05-04 ENCOUNTER — Ambulatory Visit (INDEPENDENT_AMBULATORY_CARE_PROVIDER_SITE_OTHER): Payer: Medicare HMO | Admitting: Obstetrics and Gynecology

## 2019-05-04 VITALS — BP 128/60 | HR 84 | Temp 98.2°F | Ht 66.0 in | Wt 185.0 lb

## 2019-05-04 DIAGNOSIS — N952 Postmenopausal atrophic vaginitis: Secondary | ICD-10-CM

## 2019-05-04 DIAGNOSIS — Z803 Family history of malignant neoplasm of breast: Secondary | ICD-10-CM

## 2019-05-04 DIAGNOSIS — R300 Dysuria: Secondary | ICD-10-CM | POA: Diagnosis not present

## 2019-05-04 DIAGNOSIS — Z1231 Encounter for screening mammogram for malignant neoplasm of breast: Secondary | ICD-10-CM

## 2019-05-04 DIAGNOSIS — Z01419 Encounter for gynecological examination (general) (routine) without abnormal findings: Secondary | ICD-10-CM

## 2019-05-04 LAB — POCT URINALYSIS DIPSTICK
Bilirubin, UA: NEGATIVE
Blood, UA: NEGATIVE
Glucose, UA: NEGATIVE
Ketones, UA: NEGATIVE
Leukocytes, UA: NEGATIVE
Nitrite, UA: NEGATIVE
Protein, UA: NEGATIVE
Urobilinogen, UA: NEGATIVE E.U./dL — AB
pH, UA: 7 (ref 5.0–8.0)

## 2019-05-04 MED ORDER — DIFLUCAN 150 MG PO TABS: ORAL_TABLET | ORAL | 2 refills | Status: DC

## 2019-05-04 NOTE — Telephone Encounter (Signed)
-----   Message from Salvadore Dom, MD sent at 05/04/2019 12:59 PM EST ----- Please schedule her screening mammogram at the breast center and ask them to have the mammogram read by Radiology and inform her of the results before she leaves. She has a strong FH of breast cancer and is anxious about results.  Thanks, Sharee Pimple

## 2019-05-04 NOTE — Telephone Encounter (Signed)
Spoke to [place] at [facility]. Pt scheduled for screening MMG for 05/30/2019 at 1200pm . Pt to get Covid vaccine after 06/07/2019 since having Covid on 03/09/2019.   Call placed to pt. Left detailed message to pt per DPR. Pt given MMG appt and update from Dr Talbert Nan re: vaginal dryness and to use Replens OTC or coconut oil. Pt to return call to office with any questions or concerns.   Routing to Dr Talbert Nan and will close encounter.

## 2019-05-04 NOTE — Patient Instructions (Signed)
EXERCISE AND DIET:  We recommended that you start or continue a regular exercise program for good health. Regular exercise means any activity that makes your heart beat faster and makes you sweat.  We recommend exercising at least 30 minutes per day at least 3 days a week, preferably 4 or 5.  We also recommend a diet low in fat and sugar.  Inactivity, poor dietary choices and obesity can cause diabetes, heart attack, stroke, and kidney damage, among others.    ALCOHOL AND SMOKING:  Women should limit their alcohol intake to no more than 7 drinks/beers/glasses of wine (combined, not each!) per week. Moderation of alcohol intake to this level decreases your risk of breast cancer and liver damage. And of course, no recreational drugs are part of a healthy lifestyle.  And absolutely no smoking or even second hand smoke. Most people know smoking can cause heart and lung diseases, but did you know it also contributes to weakening of your bones? Aging of your skin?  Yellowing of your teeth and nails?  CALCIUM AND VITAMIN D:  Adequate intake of calcium and Vitamin D are recommended.  The recommendations for exact amounts of these supplements seem to change often, but generally speaking 1,200 mg of calcium (between diet and supplement) and 800 units of Vitamin D per day seems prudent. Certain women may benefit from higher intake of Vitamin D.  If you are among these women, your doctor will have told you during your visit.    PAP SMEARS:  Pap smears, to check for cervical cancer or precancers,  have traditionally been done yearly, although recent scientific advances have shown that most women can have pap smears less often.  However, every woman still should have a physical exam from her gynecologist every year. It will include a breast check, inspection of the vulva and vagina to check for abnormal growths or skin changes, a visual exam of the cervix, and then an exam to evaluate the size and shape of the uterus and  ovaries.  And after 79 years of age, a rectal exam is indicated to check for rectal cancers. We will also provide age appropriate advice regarding health maintenance, like when you should have certain vaccines, screening for sexually transmitted diseases, bone density testing, colonoscopy, mammograms, etc.   MAMMOGRAMS:  All women over 40 years old should have a yearly mammogram. Many facilities now offer a "3D" mammogram, which may cost around $50 extra out of pocket. If possible,  we recommend you accept the option to have the 3D mammogram performed.  It both reduces the number of women who will be called back for extra views which then turn out to be normal, and it is better than the routine mammogram at detecting truly abnormal areas.    COLON CANCER SCREENING: Now recommend starting at age 45. At this time colonoscopy is not covered for routine screening until 50. There are take home tests that can be done between 45-49.   COLONOSCOPY:  Colonoscopy to screen for colon cancer is recommended for all women at age 50.  We know, you hate the idea of the prep.  We agree, BUT, having colon cancer and not knowing it is worse!!  Colon cancer so often starts as a polyp that can be seen and removed at colonscopy, which can quite literally save your life!  And if your first colonoscopy is normal and you have no family history of colon cancer, most women don't have to have it again for   10 years.  Once every ten years, you can do something that may end up saving your life, right?  We will be happy to help you get it scheduled when you are ready.  Be sure to check your insurance coverage so you understand how much it will cost.  It may be covered as a preventative service at no cost, but you should check your particular policy.      Breast Self-Awareness Breast self-awareness means being familiar with how your breasts look and feel. It involves checking your breasts regularly and reporting any changes to your  health care provider. Practicing breast self-awareness is important. A change in your breasts can be a sign of a serious medical problem. Being familiar with how your breasts look and feel allows you to find any problems early, when treatment is more likely to be successful. All women should practice breast self-awareness, including women who have had breast implants. How to do a breast self-exam One way to learn what is normal for your breasts and whether your breasts are changing is to do a breast self-exam. To do a breast self-exam: Look for Changes  1. Remove all the clothing above your waist. 2. Stand in front of a mirror in a room with good lighting. 3. Put your hands on your hips. 4. Push your hands firmly downward. 5. Compare your breasts in the mirror. Look for differences between them (asymmetry), such as: ? Differences in shape. ? Differences in size. ? Puckers, dips, and bumps in one breast and not the other. 6. Look at each breast for changes in your skin, such as: ? Redness. ? Scaly areas. 7. Look for changes in your nipples, such as: ? Discharge. ? Bleeding. ? Dimpling. ? Redness. ? A change in position. Feel for Changes Carefully feel your breasts for lumps and changes. It is best to do this while lying on your back on the floor and again while sitting or standing in the shower or tub with soapy water on your skin. Feel each breast in the following way:  Place the arm on the side of the breast you are examining above your head.  Feel your breast with the other hand.  Start in the nipple area and make  inch (2 cm) overlapping circles to feel your breast. Use the pads of your three middle fingers to do this. Apply light pressure, then medium pressure, then firm pressure. The light pressure will allow you to feel the tissue closest to the skin. The medium pressure will allow you to feel the tissue that is a little deeper. The firm pressure will allow you to feel the tissue  close to the ribs.  Continue the overlapping circles, moving downward over the breast until you feel your ribs below your breast.  Move one finger-width toward the center of the body. Continue to use the  inch (2 cm) overlapping circles to feel your breast as you move slowly up toward your collarbone.  Continue the up and down exam using all three pressures until you reach your armpit.  Write Down What You Find  Write down what is normal for each breast and any changes that you find. Keep a written record with breast changes or normal findings for each breast. By writing this information down, you do not need to depend only on memory for size, tenderness, or location. Write down where you are in your menstrual cycle, if you are still menstruating. If you are having trouble noticing differences   in your breasts, do not get discouraged. With time you will become more familiar with the variations in your breasts and more comfortable with the exam. How often should I examine my breasts? Examine your breasts every month. If you are breastfeeding, the best time to examine your breasts is after a feeding or after using a breast pump. If you menstruate, the best time to examine your breasts is 5-7 days after your period is over. During your period, your breasts are lumpier, and it may be more difficult to notice changes. When should I see my health care provider? See your health care provider if you notice:  A change in shape or size of your breasts or nipples.  A change in the skin of your breast or nipples, such as a reddened or scaly area.  Unusual discharge from your nipples.  A lump or thick area that was not there before.  Pain in your breasts.  Anything that concerns you.  

## 2019-05-05 LAB — URINALYSIS, MICROSCOPIC ONLY
Bacteria, UA: NONE SEEN
Casts: NONE SEEN /lpf
Epithelial Cells (non renal): NONE SEEN /hpf (ref 0–10)
WBC, UA: NONE SEEN /hpf (ref 0–5)

## 2019-05-06 LAB — URINE CULTURE

## 2019-05-30 ENCOUNTER — Other Ambulatory Visit: Payer: Self-pay

## 2019-05-30 ENCOUNTER — Ambulatory Visit
Admission: RE | Admit: 2019-05-30 | Discharge: 2019-05-30 | Disposition: A | Discharge status: Home / Self Care | Payer: Medicare HMO | Source: Ambulatory Visit | Attending: Obstetrics and Gynecology | Admitting: Obstetrics and Gynecology

## 2019-05-30 DIAGNOSIS — Z1231 Encounter for screening mammogram for malignant neoplasm of breast: Secondary | ICD-10-CM

## 2019-05-31 ENCOUNTER — Encounter: Payer: Self-pay | Admitting: Neurology

## 2019-06-02 ENCOUNTER — Ambulatory Visit (INDEPENDENT_AMBULATORY_CARE_PROVIDER_SITE_OTHER): Payer: Medicare HMO | Admitting: Sports Medicine

## 2019-06-02 ENCOUNTER — Encounter: Payer: Self-pay | Admitting: Sports Medicine

## 2019-06-02 ENCOUNTER — Other Ambulatory Visit: Payer: Self-pay

## 2019-06-02 VITALS — BP 129/65 | Ht 66.0 in | Wt 185.0 lb

## 2019-06-02 DIAGNOSIS — M5030 Other cervical disc degeneration, unspecified cervical region: Secondary | ICD-10-CM

## 2019-06-02 NOTE — Progress Notes (Signed)
   [facility] [address] [place], [place] [address] Phone: [phone] Fax: [phone]   Patient Name: [name] Date of Birth: [birth year] Medical Record Number: [id number] Gender: female Date of Encounter: 06/02/2019  SUBJECTIVE:      Chief Complaint:  Follow-up headaches   HPI:  Fore is following up for chronic headaches.  Unfortunately earlier this year she suffered from Covid and since that time her headaches have worsened.  She has a known history of severe cervical DJD.  She was using doxepin and tramadol and has had to increase that recently.  She denies any issues with her vision, but does complain of worsening symptoms with loud noises, bright lights, and overstimulation.  She is using an ice pack on her neck every night that helps.  Note that her headaches had resolved for nearly 2 years until this episode of COVID     ROS:     See HPI. No sense of taste yet Tired more than usual   PERTINENT  PMH / PSH / FH / SH:  Past Medical, Surgical, Social, and Family History Reviewed & Updated in the EMR.    OBJECTIVE:  BP 129/65   Ht 5\' 6"  (1.676 m)   Wt 185 lb (83.9 kg)   LMP 02/25/1995 (Approximate)   BMI 29.86 kg/m  Physical Exam:  Vital signs are reviewed.   GEN: Alert and oriented, NAD Pulm: Breathing unlabored PSY: normal mood, congruent affect  MSK: Neck No gross deformity, swelling, bruising. No midline/bony TTP. Bilateral upper cervical spasms Decreased neck range of motion in all directions  Sensation intact to light touch.   Negative spurlings. NV intact distal BUEs.   ASSESSMENT & PLAN:   1. Chronic headaches in the setting of cervical degenerative disease  We instructed patient to stop taking her doxepin 25 mg.  Instead she will take 50 mg in the morning and 50 mg around 2 PM.  She will continue to take 100 mg at bedtime.  We also instructed her to use tramadol 4 times a day for the next 30 days.  She will  let us know in a few weeks how symptoms are doing.  10 you to use the ice and perform gentle ROM exercises.   Lanier Clam, DO, [facility] Sports Medicine Fellow  I observed and examined the patient with Dr. Kathrynn Speed and agree with assessment and plan.  Note reviewed and modified by me. Ila Mcgill, MD

## 2019-06-02 NOTE — Patient Instructions (Signed)
Stop taking the 25mg  of Doxepin Instead take 50mg  of Doxepin when you wake up Take another 50mg  of Doxepin around 2pm Take 100mg  of Doxepin at bedtime  Increase your tramadol to 4 times a day for the next 30 days  Please call us when you need a refill

## 2019-06-13 ENCOUNTER — Encounter: Payer: Self-pay | Admitting: Neurology

## 2019-06-14 ENCOUNTER — Other Ambulatory Visit: Payer: Self-pay

## 2019-06-14 MED ORDER — DOXEPIN HCL 100 MG PO CAPS: 100.0000 mg | ORAL_CAPSULE | Freq: Every day | ORAL | 3 refills | Status: DC

## 2019-06-14 MED ORDER — DOXEPIN HCL 50 MG PO CAPS: ORAL_CAPSULE | ORAL | 3 refills | Status: DC

## 2019-06-15 ENCOUNTER — Encounter: Payer: Self-pay | Admitting: Neurology

## 2019-06-15 ENCOUNTER — Ambulatory Visit (INDEPENDENT_AMBULATORY_CARE_PROVIDER_SITE_OTHER): Payer: Medicare HMO | Admitting: Neurology

## 2019-06-15 ENCOUNTER — Other Ambulatory Visit: Payer: Self-pay

## 2019-06-15 VITALS — BP 126/75 | HR 83 | Temp 97.5°F | Ht 66.0 in

## 2019-06-15 DIAGNOSIS — G933 Postviral fatigue syndrome: Secondary | ICD-10-CM | POA: Diagnosis not present

## 2019-06-15 DIAGNOSIS — M542 Cervicalgia: Secondary | ICD-10-CM

## 2019-06-15 DIAGNOSIS — G9331 Postviral fatigue syndrome: Secondary | ICD-10-CM | POA: Insufficient documentation

## 2019-06-15 DIAGNOSIS — G44221 Chronic tension-type headache, intractable: Secondary | ICD-10-CM | POA: Diagnosis not present

## 2019-06-15 MED ORDER — LORAZEPAM 1 MG PO TABS: 1.0000 mg | ORAL_TABLET | Freq: Two times a day (BID) | ORAL | 1 refills | Status: DC

## 2019-06-15 MED ORDER — CYCLOBENZAPRINE HCL 5 MG PO TABS: 5.0000 mg | ORAL_TABLET | Freq: Three times a day (TID) | ORAL | 1 refills | Status: DC | PRN

## 2019-06-15 NOTE — Progress Notes (Signed)
[facility]EP [facility]INE [facility]   Provider:  Larey Seat, M D  Primary Care Physician:  Gayland Curry, DO   Referring Provider: Gayland Curry, DO   Chief Complaint  Patient presents with  . Follow-up    pt alone, rm 11. pt states that machine is doing well. she uses it nightly. DME [employer]. she had covid in Jan and since then has struggled with headaches that have caused light sensitivity. she is working with another MD trying to get them under control. med changes were updated.    HPI:  [name] is a 79 y.o. female , seen here for headaches, migraines,  that she relates to post-Covid. She had a COVID infection in January and has had headaches since.  The headaches are associated with photophobia and nausea. She wears sunglasses.  She was told by Andreas Blower, MD, to treat these aggressively - Doxepin at 100 mg at night- but she still has headaches.  When I encountered [name] last in October 2020 she had actually been headache free for a while after starting on doxepin at a lower dose.  She feels well exhausted, feels a complete lack of energy.  Outdoor activities just walking outdoors also are aggravating her allergies.  She has been a compliant CPAP user as usual with over 90% compliance and average use at time of 8 hours and 40 minutes with a minimum pressure of 5, a maximum pressure setting of 11 cmH2O and 2 cm EPR and a residual AHI of only 1.2.  Her air leaks are low and her pressure at the 91st percentile is 9.5.  There needs to be no adjustment done for the CPAP part.  Her fatigue has exacerbated she endorsed a fatigue score of 41, but she is not excessively daytime sleepy.  She reports loss of taste and smell.  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  She endorsed depression of 4 out of 15 - so not a clinical level of concern.      Interval 11/2018 [name] reports improvement in headaches after Dr. Oneida Alar has started her on doxepin. She sleeps well, her headaches are gone.  CPAP : She went to [place] and used machine every single night.  EPWORTH 1. [name] has used her machine daily for the last 30 days including 08 December 2018, average use at time 8 hours 58 minutes, her minimum pressure setting is for maximum pressure setting 10 cmH2O this 2 cm expiratory pressure relief and a resulting AHI of 2.5 there is no air leak.  The resulting AHI is mostly related to obstructive apneas.  The 95th percentile pressure is at 9.2 cmH2O, which makes me think that we need to increase her maximum pressure setting x1 cm the new window will be for up to 11 cmH2O is 2 cm EPR.    08-11-2018:[name] is a 79 y.o. female , seen here for a new concern about a recent fall- retropusive, hit her head on a paved brick pavement, all this happened on June 6th at her husband's high school reunion in [place], [place].  Patient reports that she initially felt not as much pain, she has spit up by herself went to her bedroom and rested, but after 2 or 3 days she developed a headache associated with some sickness to her stomach and some photophobia but must be migrainous in origin.  She did not break any bones, her pupils are of similar size, and she does not  have a focal weakness or sensory abnormality. She hasn't had a migraine for almost one year.  She is not dizzy- but walks with sunglasses.  She needs some intervention  for her migraine and a CT of the had to rule out any bleed.   04-15-2017, I have the pleasure of meeting with [name] today, for a regular CPAP CPAP compliance visit.  The patient now uses an AutoSet between 4 and 10 cmH2O pressure was 1 cm EPR, she has achieved a compliance of 100% with an average use at time of 9 hours and 10 minutes each night.  Her residual AHI is 1.5/h, which is a very good resolution.  95th percentile pressure is 9.7 cm water air leaks are medium. Epworth 2, FSS 41.  She loves the new CPAP auto- machine.  Here is the result of her sleep study:    NAME:  [name]                                                      DOB: [birth year] MEDICAL RECORD NUMBER [id number]                                 DOS: 01/31/2017 REFERRING PHYSICIAN: Hollace Kinnier, D.O. BMI: 30.6  STUDY RESULTS: Total Recording Time: 10 hours, 51 minutes,  Total Apnea/Hypopnea Index (AHI):   8.2/ hr. RDI 10.5 /hr.  Average Oxygen Saturation:    92 % Lowest Oxygen Desaturation:  82 %; Total Time Oxygen Saturation below 89%:  19 minutes Average Heart Rate:   64 bpm (40-161 bpm).  IMPRESSION: Mild sleep apnea with snoring, desaturation and heart rate variability = AHI 8.2/hr .  Continued CPAP therapy is indicated.  I ordered  Auto CPAP 4-10 cm water, 3 cm EPR, heated humidity and fit for dream wear interface.    I certify that I have reviewed the raw data recording prior to the issuance of this report in accordance with the standards of the American Academy of Sleep Medicine (AASM).  Larey Seat, MD     Medical Director of [facility], and accredited by [employer] of the ABPN and ABSM       Epworth score  2 , Fatigue severity score 41   She is having neck stiffness.   Social History   Socioeconomic History  . Marital status: Married    Spouse name: [name]  . Number of children: 0  . Years of education: 43  . Highest education level: Not on file  Occupational History  . Not on file  Tobacco Use  . Smoking status: Passive Smoke Exposure - Never Smoker  . Smokeless tobacco: Never Used  . 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 Alcohol use: Yes    Alcohol/week: 0.0 standard drinks    Comment: 1 glass - occas.  . Drug use: No  . Sexual activity: Not Currently    Partners: Male    Birth control/protection: Post-menopausal  Other Topics Concern  . Not on file  Social History Narrative   Patient is married [name]).   Patient drinks 2-4 cups of coffee daily.   Patient is retired.   Patient has  two years of college.  Patient is right-handed.               Social Determinants of Health   Financial Resource Strain:   . Difficulty of Paying Living Expenses:   Food Insecurity:   . Worried About Charity fundraiser in the Last Year:   . Arboriculturist in the Last Year:   Transportation Needs:   . Film/video editor (Medical):   Marland Kitchen Lack of Transportation (Non-Medical):   Physical Activity:   . Days of Exercise per Week:   . Minutes of Exercise per Session:   Stress:   . Feeling of Stress :   Social Connections:   . Frequency of Communication with Friends and Family:   . Frequency of Social Gatherings with Friends and Family:   . Attends Religious Services:   . Active Member of Clubs or Organizations:   . Attends Archivist Meetings:   Marland Kitchen Marital Status:   Intimate Partner Violence:   . Fear of Current or Ex-Partner:   . Emotionally Abused:   Marland Kitchen Physically Abused:   . Sexually Abused:     Family History  Problem Relation Age of Onset  . Breast cancer Mother   . Aneurysm Father   . Migraines Father   . Breast cancer Maternal Aunt   . Breast cancer Maternal Aunt   . Breast cancer Maternal Aunt   . Breast cancer Maternal Aunt   . High blood pressure Brother   . Melanoma Brother   . Kidney cancer Brother        Lesion on kidney  . Heart disease Other        Grandmother    Past Medical History:  Diagnosis Date  . Cancer ([place])    basal cell skin biopsies X2  . 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 Constipation   . Depression   . Fibromyalgia 8/98   [place]  . 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 Impaired hearing    left ear, wears hearing aids  . Insomnia    circadian rhythm component  . Insomnia   . Migraine 10/88   [place]illman  . Nuclear sclerosis   . OSA on CPAP 2/07   uses cpap setting of 10  . Osteoarthritis 2007   [place]  . Pain last week   left under breast pain   . Plantar fasciitis   . Scarlet fever as child    . Thyroid disorder   . Vitreous degeneration of right eye     Past Surgical History:  Procedure Laterality Date  . BREAST BIOPSY Left 6/00   [place]  . BREAST EXCISIONAL BIOPSY Left 1998  . DE QUERVAIN'S RELEASE Right 10/97   [place]  . left breast biopsy    . ROOT CANAL  02/11/2012  . TONSILLECTOMY  age 68  . TONSILLECTOMY AND ADENOIDECTOMY  1952  . 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 TOTAL KNEE ARTHROPLASTY Right 11/08/2012   Procedure: RIGHT TOTAL KNEE ARTHROPLASTY;  Surgeon: Gearlean Alf, MD;  Location: [facility];  Service: Orthopedics;  Laterality: Right;  . TUBAL LIGATION      Current Outpatient Medications  Medication Sig Dispense Refill  . aspirin EC 81 MG tablet Take 81 mg by mouth daily.    . Aspirin-Acetaminophen-Caffeine (EXCEDRIN MIGRAINE PO) Take 2 capsules by mouth as needed.     . Cholecalciferol (VITAMIN D3) 5000 units CAPS Take by mouth daily.     Marland Kitchen DIFLUCAN 150  MG tablet Brand only. 1 tablet po prn, may repeat x 1 after 72 hours if needed. 2 tablet 2  . doxepin (SINEQUAN) 50 MG capsule Take 1 capsule (50 mg) by mouth when you wake up daily. Take 1 capsule (50 mg) by mouth around 2 pm daily. 60 capsule 3  . Eszopiclone (ESZOPICLONE) 3 MG TABS Take 3 mg by mouth at bedtime. Take immediately before bedtime    . levothyroxine (SYNTHROID, LEVOTHROID) 200 MCG tablet Take 200 mcg by mouth daily before breakfast.    . LORazepam (ATIVAN) 1 MG tablet Take 1 mg by mouth 2 (two) times daily.     Marland Kitchen MELATONIN PO Take 20 mg by mouth at bedtime.     . montelukast (SINGULAIR) 10 MG tablet Take 10 mg by mouth every evening.  3  . polyethylene glycol (MIRALAX / GLYCOLAX) packet Take 17 g by mouth daily.    . traMADol (ULTRAM) 50 MG tablet Take 1 tablet (50 mg total) by mouth 3 (three) times daily as needed. (Patient taking differently: Take 50 mg by mouth 4 (four) times daily as needed. ) 90 tablet 3  . valACYclovir (VALTREX) 500 MG tablet TAKE 1 TABLET EVERY DAY INCREASE TO 1  TABLET TWICE DAILY FOR 3 DAYS AS NEEDED 100 tablet 1  . celecoxib (CELEBREX) 200 MG capsule TAKE 1 CAPSULE BY MOUTH EVERY DAY (Patient not taking: Reported on 06/15/2019) 90 capsule 3  . doxepin (SINEQUAN) 100 MG capsule Take 1 capsule (100 mg total) by mouth at bedtime. (Patient not taking: Reported on 06/15/2019) 30 capsule 3   No current facility-administered medications for this visit.    Allergies as of 06/15/2019 - Review Complete 06/15/2019  Allergen Reaction Noted  . Penicillins Anaphylaxis 04/29/2011  . Codeine Nausea Only 04/29/2011  . Oxycodone-acetaminophen  03/10/2019  . Penicillamine  03/10/2019  . Provigil [modafinil]  03/24/2013  . Seroquel [quetiapine fumarate]  07/20/2014  . Xyrem [oxybate]  06/18/2011  . Ciprofloxacin Rash 07/01/2012  . Monistat [miconazole] Rash 07/01/2012  . Terazol [terconazole] Rash 07/01/2012    Vitals: BP 126/75   Pulse 83   Temp (!) 97.5 F (36.4 C)   Ht 5\' 6"  (1.676 m)   LMP 02/25/1995 (Approximate)   BMI 29.86 kg/m  Last Weight:  Wt Readings from Last 1 Encounters:  06/02/19 185 lb (83.9 kg)   [id number] mass index is 29.86 kg/m.     Last Height:   Ht Readings from Last 1 Encounters:  06/15/19 5\' 6"  (1.676 m)    Physical exam: The patient feels hot to the touch and is flushed. There is a tremor in both hands.   General: The patient is awake, alert and appears not in acute distress. The patient is well groomed. Head: Normocephalic, atraumatic. Neck is supple. Mallampati 4  neck circumference:15.5 . Nasal airflow patent.  Cardiovascular:  Regular rate and rhythm , without  murmurs or carotid bruit, and without distended neck veins. Respiratory: Lungs are clear to auscultation. Skin:  Without evidence of edema, or rash Trunk: BMI is  30.18- .   Neurologic exam : The patient is awake and alert, oriented to place and time.   MOCA:No flowsheet data found. MMSE: MMSE - Mini Mental State Exam 10/28/2017 09/23/2016 08/31/2015    Orientation to time 5 5 4   Orientation to Place 5 5 5   Registration 3 3 3   Attention/ Calculation 5 5 4   Recall 2 2 3   Language- name 2 objects 2 2 2   Language-  repeat 1 1 1   Language- follow 3 step command 3 3 1   Language- read & follow direction 1 1 1   Write a sentence 1 1 1   Copy design 1 1 1   Total score 29 29 26       Attention span & concentration ability appears normal.  Speech is fluent, Mood and affect are appropriate.  Cranial nerves: Pupils are equal and briskly reactive to light. Funduscopic exam without evidence of pallor or edema. Extraocular movements  in vertical and horizontal planes intact and without nystagmus. Visual fields by finger perimetry are intact. Hearing to finger rub intact. Facial sensation intact to fine touch. Facial motor strength is symmetric and tongue and uvula move midline. Shoulder shrug was asymmetrical.  Neck and shoulders are stiff.- extremely tense.   Motor exam:  Normal tone, muscle bulk and symmetric strength in all extremities. Sensory:  Fine touch, pinprick and vibration were tested in all extremities. Proprioception tested in the upper extremities was normal. Coordination: Rapid alternating movements in the fingers/hands was normal. Finger-to-nose maneuver  normal without evidence of ataxia, dysmetria or tremor. Gait and station: Patient walks without assistive device . Deep tendon reflexes: in the  upper and lower extremities are symmetric and intact. I avoided the patella.  Babinski maneuver response is downgoing.   Assessment:  After physical and neurologic examination, review of laboratory studies,  Personal review of imaging studies, reports of other /same  Imaging studies, results of polysomnography and / or neurophysiology testing and pre-existing records as far as provided in visit., my assessment is   1) compliant CPAP use.  2) neck stiffness and cervicalgia exacerbated by COVID infection and relentlessly present ever since.   3) post Covid - 3 month still having anosmia and ageusia.  She got the vaccine in the meantime/.   The patient was advised of the nature of the diagnosed disorder , the treatment options and the  risks for general health and wellness arising from not treating the condition.  I spent more than 20 minutes of face to face time with the patient. Greater than 50% of time was spent in counseling and coordination of care. We have discussed the diagnosis and differential and I answered the patient's questions.    Plan:  Treatment plan and additional workup : continue using CPAP with the adjusted settings. .   We will refer for trigger point injection for neck spasms. I will also prescribe a muscle relaxant.  I will set an appointment for the next 14 days.   Awaiting Dr Plovsky's input about the medication for sleep/ headaches .   Larey Seat, MD   0000000, [id number] PM  Certified in Neurology by ABPN Certified in [place] by [facility] [address], [place], [place] [address]

## 2019-06-15 NOTE — Patient Instructions (Signed)
Place neck pain patient instructions here.  We will set an appointment for the next 14 days for triggerpoint injections.

## 2019-06-16 ENCOUNTER — Ambulatory Visit: Payer: Medicare HMO | Admitting: Internal Medicine

## 2019-06-22 DIAGNOSIS — M109 Gout, unspecified: Secondary | ICD-10-CM | POA: Insufficient documentation

## 2019-06-29 ENCOUNTER — Ambulatory Visit: Payer: Medicare HMO | Admitting: Neurology

## 2019-06-30 ENCOUNTER — Other Ambulatory Visit: Payer: Self-pay | Admitting: *Deleted

## 2019-06-30 MED ORDER — TRAMADOL HCL 50 MG PO TABS: ORAL_TABLET | ORAL | 3 refills | Status: DC

## 2019-07-03 NOTE — Progress Notes (Signed)
Cardiology Office Note:   Date:  07/04/2019  NAME:  [name]    MRN: [id number] DOB:  [birth year]   PCP:  Gayland Curry, DO  Cardiologist:  No primary care provider on file.  Electrophysiologist:  None   Referring MD: Gayland Curry, DO   Chief Complaint  Patient presents with  . Chest Pain   History of Present Illness:   [name] is a 79 y.o. female with a hx of OSA, fibromyalgia who is being seen today for the evaluation of chest pain at the request of Reed, Tiffany L, DO. Was evaluated in 2015 for CP and stress recommended. Did not completed as psychiatry altered medications and she felt better.   She presents for evaluation of chest pain she has had for years.  She reports that 1-2 times per month she can get sharp left-sided chest pain.  It will last seconds and resolve without intervention.  She reports she can get it anytime.  It can occur with exertion or with sitting.  No identifiable trigger.  No alleviating factors reported.  She reports she was seen in 2015 and decided to not undergo a stress test as she was evaluated by her psychiatrist and medications were changed.  Her echocardiogram was normal at that time.  She reports she exercises routinely walking 2 times a week.  She can walk up to 2 miles without any major issues.  She does have some back pain which can limit her.  She also does water aerobics without any major limitations.  She has sleep apnea which is treated.  Of note, she also has hypothyroidism which is replaced with Synthroid.  Her most recent TSH was 0.00.  Her free T4 and T3 were normal.  Apparently Dr. Claiborne Billings had concerns there was too much thyroid medicine on board but her endocrinologist believes things are okay.  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  She was exposed to secondhand smoke extensively.  Her most recent cholesterol profile shows a total cholesterol 163, HDL 44, LDL 99,  triglycerides 98.  She reports that several grandparents had heart attacks in the past.  She also reports that her brother was diagnosed with atrial fibrillation.  Overall she is quite concerned about her heart and wants to know everything is okay.  Problem List 1. OSA 2. Fibromyalgia  Past Medical History: Past Medical History:  Diagnosis Date  . Cancer ([place])    basal cell skin biopsies X2  . 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 Constipation   . Depression   . Fibromyalgia 8/98   [place]  . 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 Impaired hearing    left ear, wears hearing aids  . Insomnia    circadian rhythm component  . Insomnia   . Migraine 10/88   Spillman  . Nuclear sclerosis   . OSA on CPAP 2/07   uses cpap setting of 10  . Osteoarthritis 2007   [place]  . Pain last week   left under breast pain   . Plantar fasciitis   . Scarlet fever as child  . Thyroid disorder   . Vitreous degeneration of right eye     Past Surgical History: Past Surgical History:  Procedure Laterality Date  . BREAST BIOPSY Left 6/00   [place]  . BREAST EXCISIONAL BIOPSY Left 1998  . DE QUERVAIN'S RELEASE Right 10/97  [place]  . left breast biopsy    . ROOT CANAL  02/11/2012  . TONSILLECTOMY  age 72  . TONSILLECTOMY AND ADENOIDECTOMY  1952  . 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 TOTAL KNEE ARTHROPLASTY Right 11/08/2012   Procedure: RIGHT TOTAL KNEE ARTHROPLASTY;  Surgeon: Gearlean Alf, MD;  Location: [facility];  Service: Orthopedics;  Laterality: Right;  . TUBAL LIGATION      Current Medications: Current Meds  Medication Sig  . aspirin EC 81 MG tablet Take 81 mg by mouth daily.  . Aspirin-Acetaminophen-Caffeine (EXCEDRIN MIGRAINE PO) Take 2 capsules by mouth as needed.   . Cholecalciferol (VITAMIN D3) 5000 units CAPS Take by mouth daily.   . cyclobenzaprine (FLEXERIL) 5 MG tablet Take 1 tablet (5 mg total) by mouth every 8 (eight) hours as needed for muscle spasms.    Marland Kitchen DIFLUCAN 150 MG tablet Brand only. 1 tablet po prn, may repeat x 1 after 72 hours if needed.  . doxepin (SINEQUAN) 50 MG capsule Take 1 capsule (50 mg) by mouth when you wake up daily. Take 1 capsule (50 mg) by mouth around 2 pm daily. (Patient taking differently: Take 1 capsule (50 mg) by mouth when you wake up daily. Take 1 capsule (50 mg) by mouth around 2 pm daily. Take (100 mg) at bedtime)  . Eszopiclone (ESZOPICLONE) 3 MG TABS Take 3 mg by mouth at bedtime. Take immediately before bedtime  . levothyroxine (SYNTHROID, LEVOTHROID) 200 MCG tablet Take 200 mcg by mouth daily before breakfast.  . LORazepam (ATIVAN) 1 MG tablet Take 1 tablet (1 mg total) by mouth 2 (two) times daily.  Marland Kitchen MELATONIN PO Take 20 mg by mouth at bedtime.   . montelukast (SINGULAIR) 10 MG tablet Take 10 mg by mouth every evening.  . polyethylene glycol (MIRALAX / GLYCOLAX) packet Take 17 g by mouth daily.  . traMADol (ULTRAM) 50 MG tablet Take one tab by mouth four times a day as needed  . valACYclovir (VALTREX) 500 MG tablet TAKE 1 TABLET EVERY DAY INCREASE TO 1 TABLET TWICE DAILY FOR 3 DAYS AS NEEDED     Allergies:    Penicillins, Codeine, Oxycodone-acetaminophen, Penicillamine, Provigil [modafinil], Seroquel [quetiapine fumarate], Xyrem [oxybate], Ciprofloxacin, Monistat [miconazole], and Terazol [terconazole]   Social History: Social History   Socioeconomic History  . Marital status: Married    Spouse name: [name]  . Number of children: 0  . Years of education: 20  . Highest education level: Not on file  Occupational History  . Not on file  Tobacco Use  . Smoking status: Passive Smoke Exposure - Never Smoker  . Smokeless tobacco: Never Used  . 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 Alcohol use: Yes    Alcohol/week: 0.0 standard drinks    Comment: 1 glass - occas.  . Drug use: No  . Sexual activity: Not Currently    Partners: Male    Birth  control/protection: Post-menopausal  Other Topics Concern  . Not on file  Social History Narrative   Patient is married [name]).   Patient drinks 2-4 cups of coffee daily.   Patient is retired.   Patient has two years of college.   Patient is right-handed.               Social Determinants of Health   Financial Resource Strain:   . Difficulty of Paying Living Expenses:   Food Insecurity:   . Worried About Running  Out of Food in the Last Year:   . [place] in the Last Year:   Transportation Needs:   . Lack of Transportation (Medical):   Marland Kitchen Lack of Transportation (Non-Medical):   Physical Activity:   . Days of Exercise per Week:   . Minutes of Exercise per Session:   Stress:   . Feeling of Stress :   Social Connections:   . Frequency of Communication with Friends and Family:   . Frequency of Social Gatherings with Friends and Family:   . Attends Religious Services:   . Active Member of Clubs or Organizations:   . Attends Archivist Meetings:   Marland Kitchen Marital Status:      Family History: The patient's family history includes Aneurysm in her father; Arrhythmia in her brother; Breast cancer in her maternal aunt, maternal aunt, maternal aunt, maternal aunt, and mother; Heart attack in her paternal grandfather and paternal grandmother; Heart disease in an other family member; High blood pressure in her brother; Kidney cancer in her brother; Melanoma in her brother; Migraines in her father.  ROS:   All other ROS reviewed and negative. Pertinent positives noted in the HPI.     EKGs/Labs/Other Studies Reviewed:   The following studies were personally reviewed by me today:  EKG:  EKG is  ordered today.  The ekg ordered today demonstrates normal sinus rhythm, heart rate 77, no acute ST-T changes, no evidence for infarction, and was personally reviewed by me.  TTE 08/23/2013 - Left ventricle: The cavity size was normal. Systolic function was  normal. The estimated  ejection fraction was in the range of 55%  to 60%. Wall motion was normal; there were no regional wall  motion abnormalities. There was an increased relative  contribution of atrial contraction to ventricular filling.  Doppler parameters are consistent with abnormal left ventricular  relaxation (grade 1 diastolic dysfunction).  - Aortic valve: There was trivial regurgitation.  - Mitral valve: There was trivial regurgitation.  - Left atrium: The atrium was mildly dilated.  - Tricuspid valve: There was trivial regurgitation.     Recent Labs: No results found for requested labs within last 8760 hours.   Recent Lipid Panel    Component Value Date/Time   CHOL 160 02/11/2017 0846   CHOL 158 08/27/2015 0938   TRIG 149 02/11/2017 0846   HDL 41 (L) 02/11/2017 0846   HDL 48 08/27/2015 0938   CHOLHDL 3.9 02/11/2017 0846   LDLCALC 94 02/11/2017 0846    Physical Exam:   VS:  BP 130/68   Pulse 77   Ht 5\' 6"  (1.676 m)   Wt 190 lb (86.2 kg)   LMP 02/25/1995 (Approximate)   BMI 30.67 kg/m    Wt Readings from Last 3 Encounters:  07/04/19 190 lb (86.2 kg)  06/02/19 185 lb (83.9 kg)  05/04/19 185 lb (83.9 kg)    General: Well nourished, well developed, in no acute distress Heart: Atraumatic, normal size  Eyes: PEERLA, EOMI  Neck: Supple, no JVD Endocrine: No thryomegaly Cardiac: Normal S1, S2; RRR; no murmurs, rubs, or gallops Lungs: Clear to auscultation bilaterally, no wheezing, rhonchi or rales  Abd: Soft, nontender, no hepatomegaly  Ext: No edema, pulses 2+ Musculoskeletal: No deformities, BUE and BLE strength normal and equal Skin: Warm and dry, no rashes   Neuro: Alert and oriented to person, place, time, and situation, CNII-XII grossly intact, no focal deficits  Psych: Normal mood and affect   ASSESSMENT:  [name] is a 79 y.o. female who presents for the following: 1. Chest pain, unspecified type     PLAN:   1. Chest pain, unspecified type -Atypical.   CVD risk factors include significant smoking exposure.  She has a strong family history of myocardial infarction.  Has occurred for years.  EKG today shows normal sinus rhythm without acute ischemic changes.  She was recommended to have a stress test in 2015.  Her echo was normal.  She decided against a stress test at that time.  She continues to have symptoms.  I think a coronary CTA is reasonable.  She will take 100 mg metoprolol titrate to before the scan.  Her echocardiogram in 2015 was normal.  We will plan to see her back on an as-needed basis.  I will follow the results with her by phone.  Should she have any abnormality we will plan to see her back.  For now no need to limit her activity level.  We will see how things look on CTA.  Disposition: Return if symptoms worsen or fail to improve.  Medication Adjustments/Labs and Tests Ordered: Current medicines are reviewed at length with the patient today.  Concerns regarding medicines are outlined above.  Orders Placed This Encounter  Procedures  . CT CORONARY MORPH W/CTA COR W/SCORE W/CA W/CM &/OR WO/CM  . CT CORONARY FRACTIONAL FLOW RESERVE DATA PREP  . CT CORONARY FRACTIONAL FLOW RESERVE FLUID ANALYSIS  . Basic metabolic panel  . EKG 12-Lead   Meds ordered this encounter  Medications  . metoprolol tartrate (LOPRESSOR) 100 MG tablet    Sig: Take 1 tablet by mouth once for procedure.    Dispense:  1 tablet    Refill:  0    Patient Instructions  Medication Instructions:  Take Metoprolol 100 mg two hours before CT when scheduled.   *If you need a refill on your cardiac medications before your next appointment, please call your pharmacy*   Lab Work: BMET today   If you have labs (blood work) drawn today and your tests are completely normal, you will receive your results only by: Marland Kitchen MyChart Message (if you have MyChart) OR . A paper copy in the mail If you have any lab test that is abnormal or we need to change your treatment, we  will call you to review the results.   Testing/Procedures: Your physician has requested that you have cardiac CT. Cardiac computed tomography (CT) is a painless test that uses an x-ray machine to take clear, detailed pictures of your heart. For further information please visit HugeFiesta.[email or web address]. Please follow instruction sheet as given.    Follow-Up: At [facility], you and your health needs are our priority.  As part of our continuing mission to provide you with exceptional heart care, we have created designated Provider Care Teams.  These Care Teams include your primary Cardiologist (physician) and Advanced Practice Providers (APPs -  Physician Assistants and Nurse Practitioners) who all work together to provide you with the care you need, when you need it.  We recommend signing up for the patient portal called "MyChart".  Sign up information is provided on this After Visit Summary.  MyChart is used to connect with patients for Virtual Visits (Telemedicine).  Patients are able to view lab/test results, encounter notes, upcoming appointments, etc.  Non-urgent messages can be sent to your provider as well.   To learn more about what you can do with MyChart, go to [email or web address].  Your next appointment:   As needed  The format for your next appointment:   In Person  Provider:   Eleonore Chiquito, MD   Other Instructions Your cardiac CT will be scheduled at one of the below locations:   [facility] [address] [place], [place] [address] [phone]  scheduled at [facility], please arrive at the [facility] main entrance of [facility] 30 minutes prior to test start time. Proceed to the [facility] Radiology Department (first floor) to check-in and test prep.  Please follow these instructions carefully (unless otherwise directed):  Hold all erectile dysfunction medications at least 3 days (72 hrs) prior to test.  On the Night Before  the Test: . Be sure to Drink plenty of water. . Do not consume any caffeinated/decaffeinated beverages or chocolate 12 hours prior to your test. . Do not take any antihistamines 12 hours prior to your test. . If you take Metformin do not take 24 hours prior to test.  On the Day of the Test: . Drink plenty of water. Do not drink any water within one hour of the test. . Do not eat any food 4 hours prior to the test. . You may take your regular medications prior to the test.  . Take metoprolol (Lopressor) two hours prior to test. . HOLD Furosemide/Hydrochlorothiazide morning of the test. . FEMALES- please wear underwire-free bra if available       After the Test: . Drink plenty of water. . After receiving IV contrast, you may experience a mild flushed feeling. This is normal. . On occasion, you may experience a mild rash up to 24 hours after the test. This is not dangerous. If this occurs, you can take Benadryl 25 mg and increase your fluid intake. . If you experience trouble breathing, this can be serious. If it is severe call 911 IMMEDIATELY. If it is mild, please call our office. . If you take any of these medications: Glipizide/Metformin, Avandament, Glucavance, please do not take 48 hours after completing test unless otherwise instructed.   Once we have confirmed authorization from your insurance company, we will call you to set up a date and time for your test.   For non-scheduling related questions, please contact the cardiac imaging nurse navigator should you have any questions/concerns: Marchia Bond, RN Navigator Cardiac Imaging [facility] [phone] office  For scheduling needs, including cancellations and rescheduling, please call [phone].         Signed, Addison Naegeli. Audie Box, [place]  [address] [place], [place] [address] [phone]  07/04/2019 10:29 AM

## 2019-07-04 ENCOUNTER — Encounter: Payer: Self-pay | Admitting: Cardiovascular Disease

## 2019-07-04 ENCOUNTER — Other Ambulatory Visit: Payer: Self-pay

## 2019-07-04 ENCOUNTER — Ambulatory Visit (INDEPENDENT_AMBULATORY_CARE_PROVIDER_SITE_OTHER): Payer: Medicare HMO | Admitting: Cardiovascular Disease

## 2019-07-04 VITALS — BP 130/68 | HR 77 | Ht 66.0 in | Wt 190.0 lb

## 2019-07-04 DIAGNOSIS — R079 Chest pain, unspecified: Secondary | ICD-10-CM

## 2019-07-04 MED ORDER — METOPROLOL TARTRATE 100 MG PO TABS: ORAL_TABLET | ORAL | 0 refills | Status: DC

## 2019-07-04 NOTE — Patient Instructions (Signed)
Medication Instructions:  Take Metoprolol 100 mg two hours before CT when scheduled.   *If you need a refill on your cardiac medications before your next appointment, please call your pharmacy*   Lab Work: BMET today   If you have labs (blood work) drawn today and your tests are completely normal, you will receive your results only by: Marland Kitchen MyChart Message (if you have MyChart) OR . A paper copy in the mail If you have any lab test that is abnormal or we need to change your treatment, we will call you to review the results.   Testing/Procedures: Your physician has requested that you have cardiac CT. Cardiac computed tomography (CT) is a painless test that uses an x-ray machine to take clear, detailed pictures of your heart. For further information please visit HugeFiesta.tn. Please follow instruction sheet as given.    Follow-Up: At [facility], you and your health needs are our priority.  As part of our continuing mission to provide you with exceptional heart care, we have created designated Provider Care Teams.  These Care Teams include your primary Cardiologist (physician) and Advanced Practice Providers (APPs -  Physician Assistants and Nurse Practitioners) who all work together to provide you with the care you need, when you need it.  We recommend signing up for the patient portal called "MyChart".  Sign up information is provided on this After Visit Summary.  MyChart is used to connect with patients for Virtual Visits (Telemedicine).  Patients are able to view lab/test results, encounter notes, upcoming appointments, etc.  Non-urgent messages can be sent to your provider as well.   To learn more about what you can do with MyChart, go to [email or web address].    Your next appointment:   As needed  The format for your next appointment:   In Person  Provider:   Eleonore Chiquito, MD   Other Instructions Your cardiac CT will be scheduled at one of the below locations:    [facility] [address] [place], [place] [address] [phone]  scheduled at [facility], please arrive at the [facility] main entrance of [facility] 30 minutes prior to test start time. Proceed to the [facility] Radiology Department (first floor) to check-in and test prep.  Please follow these instructions carefully (unless otherwise directed):  Hold all erectile dysfunction medications at least 3 days (72 hrs) prior to test.  On the Night Before the Test: . Be sure to Drink plenty of water. . Do not consume any caffeinated/decaffeinated beverages or chocolate 12 hours prior to your test. . Do not take any antihistamines 12 hours prior to your test. . If you take Metformin do not take 24 hours prior to test.  On the Day of the Test: . Drink plenty of water. Do not drink any water within one hour of the test. . Do not eat any food 4 hours prior to the test. . You may take your regular medications prior to the test.  . Take metoprolol (Lopressor) two hours prior to test. . HOLD Furosemide/Hydrochlorothiazide morning of the test. . FEMALES- please wear underwire-free bra if available       After the Test: . Drink plenty of water. . After receiving IV contrast, you may experience a mild flushed feeling. This is normal. . On occasion, you may experience a mild rash up to 24 hours after the test. This is not dangerous. If this occurs, you can take Benadryl 25 mg and increase your  fluid intake. . If you experience trouble breathing, this can be serious. If it is severe call 911 IMMEDIATELY. If it is mild, please call our office. . If you take any of these medications: Glipizide/Metformin, Avandament, Glucavance, please do not take 48 hours after completing test unless otherwise instructed.   Once we have confirmed authorization from your insurance company, we will call you to set up a date and time for your test.   For non-scheduling related  questions, please contact the cardiac imaging nurse navigator should you have any questions/concerns: Marchia Bond, RN Navigator Cardiac Imaging [facility] [phone] office  For scheduling needs, including cancellations and rescheduling, please call [phone].

## 2019-07-05 LAB — BASIC METABOLIC PANEL
BUN/Creatinine Ratio: 35 — ABNORMAL HIGH (ref 12–28)
BUN: 22 mg/dL (ref 8–27)
CO2: 23 mmol/L (ref 20–29)
Calcium: 9.5 mg/dL (ref 8.7–10.3)
Chloride: 106 mmol/L (ref 96–106)
Creatinine, Ser: 0.62 mg/dL (ref 0.57–1.00)
GFR calc Af Amer: 99 mL/min/{1.73_m2} (ref >59)
GFR calc non Af Amer: 86 mL/min/{1.73_m2} (ref >59)
Glucose: 97 mg/dL (ref 65–99)
Potassium: 4.9 mmol/L (ref 3.5–5.2)
Sodium: 143 mmol/L (ref 134–144)

## 2019-07-06 ENCOUNTER — Other Ambulatory Visit: Payer: Self-pay | Admitting: Sports Medicine

## 2019-07-14 ENCOUNTER — Other Ambulatory Visit: Payer: Self-pay

## 2019-07-14 ENCOUNTER — Ambulatory Visit (INDEPENDENT_AMBULATORY_CARE_PROVIDER_SITE_OTHER): Payer: Medicare HMO | Admitting: Sports Medicine

## 2019-07-14 DIAGNOSIS — M47812 Spondylosis without myelopathy or radiculopathy, cervical region: Secondary | ICD-10-CM | POA: Diagnosis not present

## 2019-07-14 DIAGNOSIS — G43101 Migraine with aura, not intractable, with status migrainosus: Secondary | ICD-10-CM

## 2019-07-14 MED ORDER — DOXEPIN HCL 50 MG PO CAPS: ORAL_CAPSULE | ORAL | 2 refills | Status: DC

## 2019-07-14 NOTE — Assessment & Plan Note (Signed)
Note she now has Ubrelvy for acute HA  I will continue to work on her neck and MSK issues to keep from triggering migraines

## 2019-07-14 NOTE — Patient Instructions (Signed)
Plan  Start celebrex in the morning Cut the tramadol to 3 times a day Keep the doxepin at morning/ 2 pm 50 mg !00 mg of doxepin at bedtime  Work just easy motion for your neck Once you go 3 days without a headache then add a little isometric exercise for your neck  Gradually ease back into your walking regimen  Water exercise when possible  Try to practice getting up from chair as an exercise

## 2019-07-14 NOTE — Progress Notes (Signed)
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 Forde Dandy gave her; Roselyn Meier for acute migraine attacks Works well and rapidly and knocked out her HA  Wants to get back to walking  ROS Concerned about overall weakness (getting out of low chair) Muscle aches worse since I stopped her Celebrex  PE NAD today and notes not in pain BP 121/71   Ht 5\' 6"  (1.676 m)   Wt 188 lb (85.3 kg)   LMP 02/25/1995 (Approximate)   BMI 30.34 kg/m   Neck motion is improved with chronic limits Better than on last visit Trapezius bilaterally is still tight but no spasm  Gait normal Able to get from chair to stand without hands

## 2019-07-14 NOTE — Assessment & Plan Note (Signed)
Note we have been able to stabilize this Worse after COVID  Keep higher dose Doxepin for next 1 to 2 months Switch tramadol to tid Restart Celebrex QAM  Reck 2 mos

## 2019-07-26 ENCOUNTER — Other Ambulatory Visit: Payer: Self-pay | Admitting: Sports Medicine

## 2019-07-26 ENCOUNTER — Ambulatory Visit: Payer: Medicare HMO | Admitting: Internal Medicine

## 2019-07-26 MED ORDER — TRAMADOL HCL 50 MG PO TABS: ORAL_TABLET | ORAL | 3 refills | Status: DC

## 2019-08-05 ENCOUNTER — Other Ambulatory Visit: Payer: Self-pay

## 2019-08-05 DIAGNOSIS — Z01812 Encounter for preprocedural laboratory examination: Secondary | ICD-10-CM

## 2019-08-06 LAB — BASIC METABOLIC PANEL
BUN/Creatinine Ratio: 27 (ref 12–28)
BUN: 15 mg/dL (ref 8–27)
CO2: 23 mmol/L (ref 20–29)
Calcium: 9.4 mg/dL (ref 8.7–10.3)
Chloride: 103 mmol/L (ref 96–106)
Creatinine, Ser: 0.56 mg/dL — ABNORMAL LOW (ref 0.57–1.00)
GFR calc Af Amer: 103 mL/min/{1.73_m2} (ref >59)
GFR calc non Af Amer: 89 mL/min/{1.73_m2} (ref >59)
Glucose: 94 mg/dL (ref 65–99)
Potassium: 4.7 mmol/L (ref 3.5–5.2)
Sodium: 140 mmol/L (ref 134–144)

## 2019-08-10 ENCOUNTER — Telehealth (HOSPITAL_COMMUNITY): Payer: Self-pay | Admitting: *Deleted

## 2019-08-10 NOTE — Telephone Encounter (Signed)
Pt returning call regarding upcoming cardiac imaging study; pt verbalizes understanding of appt date/time, parking situation and where to check in, pre-test NPO status and medications ordered, and verified current allergies; name and call back number provided for further questions should they arise  Sheena Simonis Tai RN Navigator Cardiac Imaging [facility] [phone] office [phone] cell  

## 2019-08-10 NOTE — Telephone Encounter (Signed)
Attempted to call patient regarding upcoming cardiac CT appointment. Left message on voicemail with name and callback number  Marcelene Weidemann Tai RN Navigator Cardiac Imaging [facility] [phone] Office [phone] Cell 

## 2019-08-12 ENCOUNTER — Ambulatory Visit (HOSPITAL_COMMUNITY)
Admission: RE | Admit: 2019-08-12 | Discharge: 2019-08-12 | Disposition: A | Discharge status: Home / Self Care | Payer: Medicare HMO | Source: Ambulatory Visit | Attending: Cardiovascular Disease | Admitting: Cardiovascular Disease

## 2019-08-12 ENCOUNTER — Other Ambulatory Visit: Payer: Self-pay

## 2019-08-12 DIAGNOSIS — I2510 Atherosclerotic heart disease of native coronary artery without angina pectoris: Secondary | ICD-10-CM | POA: Diagnosis not present

## 2019-08-12 DIAGNOSIS — R079 Chest pain, unspecified: Secondary | ICD-10-CM

## 2019-08-12 MED ORDER — NITROGLYCERIN 0.4 MG SL SUBL
0.8000 mg | SUBLINGUAL_TABLET | Freq: Once | SUBLINGUAL | Status: AC
  Administered 2019-08-12: 0.8 mg via SUBLINGUAL

## 2019-08-12 MED ORDER — IOHEXOL 350 MG/ML SOLN
80.0000 mL | Freq: Once | INTRAVENOUS | Status: AC | PRN
  Administered 2019-08-12: 80 mL via INTRAVENOUS

## 2019-08-12 MED ORDER — NITROGLYCERIN 0.4 MG SL SUBL
SUBLINGUAL_TABLET | SUBLINGUAL | Status: AC
  Filled 2019-08-12: qty 2

## 2019-08-16 ENCOUNTER — Telehealth: Payer: Self-pay | Admitting: Cardiovascular Disease

## 2019-08-16 NOTE — Telephone Encounter (Signed)
LMTCB
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
## 2019-08-16 NOTE — Telephone Encounter (Signed)
Follow Up:     Pt is returning [name]'s call from today 

## 2019-08-19 NOTE — Telephone Encounter (Signed)
See patient advise request.

## 2019-09-13 ENCOUNTER — Ambulatory Visit (INDEPENDENT_AMBULATORY_CARE_PROVIDER_SITE_OTHER): Payer: Medicare HMO | Admitting: Sports Medicine

## 2019-09-13 ENCOUNTER — Other Ambulatory Visit: Payer: Self-pay

## 2019-09-13 DIAGNOSIS — M5030 Other cervical disc degeneration, unspecified cervical region: Secondary | ICD-10-CM | POA: Diagnosis not present

## 2019-09-13 MED ORDER — TRAMADOL HCL 50 MG PO TABS: ORAL_TABLET | ORAL | 3 refills | Status: DC

## 2019-09-13 NOTE — Assessment & Plan Note (Signed)
Unfortunately her cervical spine disease is significant enough that when she travels on airplanes or sleeps in unusual hotels she often triggers neck pain and headaches Try to find travel pillows and/or use her collar to see if she can lessen this For the short-term we will increase her tramadol to 4 times daily Keep her Sinequan at 50 morning 50 at 2 PM and 100 at night Continue the Celebrex 200 in the morning 2-3 times daily do ice packs to the back of the neck Follow this with isometric exercises in 4 positions for the neck keeping the head neutral Use her cervical collar for rest at night while watching TV or when using a computer  Recheck after 1 month

## 2019-09-13 NOTE — Progress Notes (Signed)
Chief complaint neck pain triggering chronic headache  On patient's last visit in May she had substantially improved We cut her tramadol back to 3 times a day We did restart her Celebrex at 200 She found not taking that caused other joints to have more pain We kept her doxepin at 50 in the morning 50 at 2 PM and 100 at night  With this regimen she went on additional month without headaches She and her husband did some substantial travel to [place] to [place] and one trip to [place] She slept in different beds with different pillows This started triggering her headaches again She has not had a headache daily for the last 3 weeks that is moderately severe and associated with neck and shoulder tightness  Ice packs really helped give her relief Doxepin has given her the most relief in the past and when combined with tramadol she had very few migraines  Review of systems No weakness in either hand No radicular symptoms down her arms Pain with too much flexion of the neck  Physical exam Pleasant older female who sits in a head forward position BP 114/89   Ht 5\' 6"  (1.676 m)   Wt 188 lb (85.3 kg)   LMP 02/25/1995 (Approximate)   BMI 30.34 kg/m   Cervical range of motion reveals she has somewhat limited extension but it is not very painful Flexion is full but brings up pain Left lateral bend is limited and painful Right lateral bend is more normal and less painful  Both trapezius muscles are tight and seems spasmed This is much different from her last exam  Neurologic testing of C5-T1 reveals good strength normal reflexes and no sensory change

## 2019-09-13 NOTE — Patient Instructions (Signed)
Tramadol Go to 4 times per day:   Morning, 2 PM, 6 pm, bedtime  Continue Doxepin 50 morning/ 50 at 2 and 100 at 9 PM  Ice massage morning and late afternoon Follow this with isometrics in 4 directions Hold for a count of 10 Repeat 3 times  Make sure pillows are OK Bad day experiment with putting on collar for a few hours  See me in 1 month

## 2019-10-04 ENCOUNTER — Encounter: Payer: Self-pay | Admitting: Neurology

## 2019-10-15 ENCOUNTER — Other Ambulatory Visit: Payer: Self-pay | Admitting: Sports Medicine

## 2019-10-20 ENCOUNTER — Other Ambulatory Visit: Payer: Self-pay

## 2019-10-20 ENCOUNTER — Ambulatory Visit (INDEPENDENT_AMBULATORY_CARE_PROVIDER_SITE_OTHER): Payer: Medicare HMO | Admitting: Sports Medicine

## 2019-10-20 DIAGNOSIS — M47812 Spondylosis without myelopathy or radiculopathy, cervical region: Secondary | ICD-10-CM

## 2019-10-20 NOTE — Patient Instructions (Signed)
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

## 2019-10-21 NOTE — Assessment & Plan Note (Signed)
We will decrease tramadol to tid Keep up soxepin Restart her celebrex  Continue on isometirc and gentle motion exercises for neck  Reck 2 months  Much improved

## 2019-10-21 NOTE — Progress Notes (Signed)
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 We increased her tramadol to 4 times a day We increase the total dose of doxepin We used isometric exercises  She states that she is back able to walk and do normal activities No migraine type headaches in the last week Generally feeling much better  She did stop her Celebrex and now has some aching in other joints  Review of systems No numbness or tingling in her hands No weakness in either arm  Physical exam Patient looks more comfortable in a seated with the head in a normal position BP 113/71   Ht 5\' 6"  (1.676 m)   Wt 188 lb (85.3 kg)   LMP 02/25/1995 (Approximate)   BMI 30.34 kg/m   Somewhat limited ROM cervical spine Good flexion and extension today without pain Decreased rotation and lateral bending but not painful No trapeziaua spasm  neruo testing  C5 to T1 is normal

## 2019-10-27 ENCOUNTER — Telehealth: Payer: Self-pay

## 2019-10-27 NOTE — Telephone Encounter (Signed)
Per Dr. Oneida Alar - we will increase her tramadol to 4 times a day and continue the celebrex and doxepin as she has been. If the celebrex doesn't seem to be helping we may need to consider stopping it. She will try this for the next few days and call back next week with an update. Pt understands and agrees with the plan.

## 2019-11-18 ENCOUNTER — Encounter: Payer: Medicare HMO | Admitting: Family

## 2019-11-21 ENCOUNTER — Other Ambulatory Visit: Payer: Self-pay

## 2019-11-21 ENCOUNTER — Ambulatory Visit (INDEPENDENT_AMBULATORY_CARE_PROVIDER_SITE_OTHER): Payer: Medicare HMO | Admitting: *Deleted

## 2019-11-21 DIAGNOSIS — Z23 Encounter for immunization: Secondary | ICD-10-CM | POA: Diagnosis not present

## 2019-11-22 ENCOUNTER — Encounter: Payer: Self-pay | Admitting: Family

## 2019-11-22 ENCOUNTER — Ambulatory Visit (INDEPENDENT_AMBULATORY_CARE_PROVIDER_SITE_OTHER): Payer: Medicare HMO | Admitting: Family

## 2019-11-22 ENCOUNTER — Telehealth: Payer: Self-pay

## 2019-11-22 DIAGNOSIS — Z0000 Encounter for general adult medical examination without abnormal findings: Secondary | ICD-10-CM

## 2019-11-22 DIAGNOSIS — E6609 Other obesity due to excess calories: Secondary | ICD-10-CM

## 2019-11-22 DIAGNOSIS — Z6830 Body mass index (BMI) 30.0-30.9, adult: Secondary | ICD-10-CM | POA: Diagnosis not present

## 2019-11-22 NOTE — Progress Notes (Signed)
   This service is provided via telemedicine  No vital signs collected/recorded due to the encounter was a telemedicine visit.   Location of patient (ex: home, work):  Home  Patient consents to a telephone visit:  Yes, see telephone encounter dated 11/22/2019 with annual consent   Location of the provider (ex: office, home):  [facility], Office   Name of any referring provider:  Gayland Curry, DO  Names of all persons participating in the telemedicine service and their role in the encounter:  Marlowe Sax, NP, [place] and Patient  Time spent on call:  16 min with medical assistant

## 2019-11-22 NOTE — Patient Instructions (Signed)
[name] , Thank you for taking time to come for your Medicare Wellness Visit. I appreciate your ongoing commitment to your health goals. Please review the following plan we discussed and let me know if I can assist you in the future.   Screening recommendations/referrals: Colonoscopy: N/A  Mammogram: Up to date  Bone Density: Up to date  Recommended yearly ophthalmology/optometry visit for glaucoma screening and checkup Recommended yearly dental visit for hygiene and checkup  Vaccinations: Influenza vaccine:  Pneumococcal vaccine  Tdap vaccine: yearly  Shingles vaccine    Advanced directives: Yes   Conditions/risks identified: Advance age female > 8 yrs,Hypertension,Obesity,Hx of smoking exposure   Next appointment: 1 year    Preventive Care 26 Years and Older, Female Preventive care refers to lifestyle choices and visits with your health care provider that can promote health and wellness. What does preventive care include?  A yearly physical exam. This is also called an annual well check.  Dental exams once or twice a year.  Routine eye exams. Ask your health care provider how often you should have your eyes checked.  Personal lifestyle choices, including:  Daily care of your teeth and gums.  Regular physical activity.  Eating a healthy diet.  Avoiding tobacco and drug use.  Limiting alcohol use.  Practicing safe sex.  Taking low-dose aspirin every day.  Taking vitamin and mineral supplements as recommended by your health care provider. What happens during an annual well check? The services and screenings done by your health care provider during your annual well check will depend on your age, overall health, lifestyle risk factors, and family history of disease. Counseling  Your health care provider may ask you questions about your:  Alcohol use.  Tobacco use.  Drug use.  Emotional well-being.  Home and relationship well-being.  Sexual  activity.  Eating habits.  History of falls.  Memory and ability to understand (cognition).  Work and work Statistician.  Reproductive health. Screening  You may have the following tests or measurements:  Height, weight, and BMI.  Blood pressure.  Lipid and cholesterol levels. These may be checked every 5 years, or more frequently if you are over 87 years old.  Skin check.  Lung cancer screening. You may have this screening every year starting at age 94 if you have a 30-pack-year history of smoking and currently smoke or have quit within the past 15 years.  Fecal occult blood test (FOBT) of the stool. You may have this test every year starting at age 26.  Flexible sigmoidoscopy or colonoscopy. You may have a sigmoidoscopy every 5 years or a colonoscopy every 10 years starting at age 55.  Hepatitis C blood test.  Hepatitis B blood test.  Sexually transmitted disease (STD) testing.  Diabetes screening. This is done by checking your blood sugar (glucose) after you have not eaten for a while (fasting). You may have this done every 1-3 years.  Bone density scan. This is done to screen for osteoporosis. You may have this done starting at age 44.  Mammogram. This may be done every 1-2 years. Talk to your health care provider about how often you should have regular mammograms. Talk with your health care provider about your test results, treatment options, and if necessary, the need for more tests. Vaccines  Your health care provider may recommend certain vaccines, such as:  Influenza vaccine. This is recommended every year.  Tetanus, diphtheria, and acellular pertussis (Tdap, Td) vaccine. You may need a Td booster every  10 years.  Zoster vaccine. You may need this after age 8.  Pneumococcal 13-valent conjugate (PCV13) vaccine. One dose is recommended after age 63.  Pneumococcal polysaccharide (PPSV23) vaccine. One dose is recommended after age 68. Talk to your health care  provider about which screenings and vaccines you need and how often you need them. This information is not intended to replace advice given to you by your health care provider. Make sure you discuss any questions you have with your health care provider. Document Released: 03/09/2015 Document Revised: 10/31/2015 Document Reviewed: 12/12/2014 Elsevier Interactive Patient Education  2017 [place] Prevention in the Home Falls can cause injuries. They can happen to people of all ages. There are many things you can do to make your home safe and to help prevent falls. What can I do on the outside of my home?  Regularly fix the edges of walkways and driveways and fix any cracks.  Remove anything that might make you trip as you walk through a door, such as a raised step or threshold.  Trim any bushes or trees on the path to your home.  Use bright outdoor lighting.  Clear any walking paths of anything that might make someone trip, such as rocks or tools.  Regularly check to see if handrails are loose or broken. Make sure that both sides of any steps have handrails.  Any raised decks and porches should have guardrails on the edges.  Have any leaves, snow, or ice cleared regularly.  Use sand or salt on walking paths during winter.  Clean up any spills in your garage right away. This includes oil or grease spills. What can I do in the bathroom?  Use night lights.  Install grab bars by the toilet and in the tub and shower. Do not use towel bars as grab bars.  Use non-skid mats or decals in the tub or shower.  If you need to sit down in the shower, use a plastic, non-slip stool.  Keep the floor dry. Clean up any water that spills on the floor as soon as it happens.  Remove soap buildup in the tub or shower regularly.  Attach bath mats securely with double-sided non-slip rug tape.  Do not have throw rugs and other things on the floor that can make you trip. What can I do in  the bedroom?  Use night lights.  Make sure that you have a light by your bed that is easy to reach.  Do not use any sheets or blankets that are too big for your bed. They should not hang down onto the floor.  Have a firm chair that has side arms. You can use this for support while you get dressed.  Do not have throw rugs and other things on the floor that can make you trip. What can I do in the kitchen?  Clean up any spills right away.  Avoid walking on wet floors.  Keep items that you use a lot in easy-to-reach places.  If you need to reach something above you, use a strong step stool that has a grab bar.  Keep electrical cords out of the way.  Do not use floor polish or wax that makes floors slippery. If you must use wax, use non-skid floor wax.  Do not have throw rugs and other things on the floor that can make you trip. What can I do with my stairs?  Do not leave any items on the stairs.  Make sure  that there are handrails on both sides of the stairs and use them. Fix handrails that are broken or loose. Make sure that handrails are as long as the stairways.  Check any carpeting to make sure that it is firmly attached to the stairs. Fix any carpet that is loose or worn.  Avoid having throw rugs at the top or bottom of the stairs. If you do have throw rugs, attach them to the floor with carpet tape.  Make sure that you have a light switch at the top of the stairs and the bottom of the stairs. If you do not have them, ask someone to add them for you. What else can I do to help prevent falls?  Wear shoes that:  Do not have high heels.  Have rubber bottoms.  Are comfortable and fit you well.  Are closed at the toe. Do not wear sandals.  If you use a stepladder:  Make sure that it is fully opened. Do not climb a closed stepladder.  Make sure that both sides of the stepladder are locked into place.  Ask someone to hold it for you, if possible.  Clearly mark and  make sure that you can see:  Any grab bars or handrails.  First and last steps.  Where the edge of each step is.  Use tools that help you move around (mobility aids) if they are needed. These include:  Canes.  Walkers.  Scooters.  Crutches.  Turn on the lights when you go into a dark area. Replace any light bulbs as soon as they burn out.  Set up your furniture so you have a clear path. Avoid moving your furniture around.  If any of your floors are uneven, fix them.  If there are any pets around you, be aware of where they are.  Review your medicines with your doctor. Some medicines can make you feel dizzy. This can increase your chance of falling. Ask your doctor what other things that you can do to help prevent falls. This information is not intended to replace advice given to you by your health care provider. Make sure you discuss any questions you have with your health care provider. Document Released: 12/07/2008 Document Revised: 07/19/2015 Document Reviewed: 03/17/2014 Elsevier Interactive Patient Education  2017 [employer].

## 2019-11-22 NOTE — Progress Notes (Signed)
Subjective:   [name] is a 79 y.o. female who presents for Medicare Annual (Subsequent) preventive examination.  Review of Systems     Cardiac Risk Factors include: advanced age (>46men, >66 women);hypertension;obesity (BMI >30kg/m2);smoking/ tobacco exposure     Objective:    Today's Vitals   11/22/19 1104  PainSc: 7    There is no height or weight on file to calculate BMI.  Advanced Directives 11/22/2019 03/10/2019 03/10/2019 10/28/2017 08/17/2017 09/25/2016 01/22/2016  Does Patient Have a Medical Advance Directive? Yes Yes Yes Yes Yes Yes No  Type of Paramedic of [address];Out of facility DNR (pink MOST or yellow form) Healthcare Power of [place] of [place] of [place] -  Does patient want to make changes to medical advance directive? No - Patient declined No - Patient declined No - Patient declined No - Patient declined No - Patient declined - -  Copy of Day in Chart? Yes - validated most recent copy scanned in chart (See row information) Yes - validated most recent copy scanned in chart (See row information) Yes - validated most recent copy scanned in chart (See row information) Yes Yes Yes -  Would patient like information on creating a medical advance directive? - - - - - - -  Pre-existing out of facility DNR order (yellow form or pink MOST form) - - - - - - -  Some encounter information is confidential and restricted. Go to Review Flowsheets activity to see all data.    Current Medications (verified) Outpatient Encounter Medications as of 11/22/2019  Medication Sig   aspirin EC 81 MG tablet Take 81 mg by mouth daily.   Aspirin-Acetaminophen-Caffeine (EXCEDRIN MIGRAINE PO) Take 2 capsules by mouth as needed.    Cholecalciferol (VITAMIN D3) 5000 units CAPS Take by mouth once a week.    DIFLUCAN 150 MG tablet Brand only. 1 tablet po  prn, may repeat x 1 after 72 hours if needed.   doxepin (SINEQUAN) 100 MG capsule TAKE 1 CAPSULE BY MOUTH AT BEDTIME   doxepin (SINEQUAN) 50 MG capsule Take 1 capsule (50 mg) by mouth when you wake up daily. Take 1 capsule (50 mg) by mouth around 2 pm daily. Take 2 capsules (100 mg) at bedtime daily.   Eszopiclone (ESZOPICLONE) 3 MG TABS Take 3 mg by mouth at bedtime. Take immediately before bedtime   levothyroxine (SYNTHROID, LEVOTHROID) 200 MCG tablet Take 200 mcg by mouth daily before breakfast.   LORazepam (ATIVAN) 1 MG tablet Take 1 tablet (1 mg total) by mouth 2 (two) times daily.   MELATONIN PO Take 20 mg by mouth at bedtime.    montelukast (SINGULAIR) 10 MG tablet Take 10 mg by mouth every evening.   polyethylene glycol (MIRALAX / GLYCOLAX) packet Take 17 g by mouth daily.   traMADol (ULTRAM) 50 MG tablet Take one tab by mouth four times a day as needed   valACYclovir (VALTREX) 500 MG tablet TAKE 1 TABLET EVERY DAY INCREASE TO 1 TABLET TWICE DAILY FOR 3 DAYS AS NEEDED   [DISCONTINUED] cyclobenzaprine (FLEXERIL) 5 MG tablet Take 1 tablet (5 mg total) by mouth every 8 (eight) hours as needed for muscle spasms.   [DISCONTINUED] metoprolol tartrate (LOPRESSOR) 100 MG tablet Take 1 tablet by mouth once for procedure.   No facility-administered encounter medications on file as of 11/22/2019.    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   Impaired hearing    left ear, wears hearing aids   Insomnia    circadian rhythm component   Insomnia    Migraine  10/88   [place]   Nuclear sclerosis    OSA on CPAP 2/07   uses cpap setting of 10   Osteoarthritis 2007   [place]   Pain last week   left under breast pain    Plantar fasciitis    Scarlet fever as child   Thyroid disorder    Vitreous degeneration of right eye    Past Surgical History:  Procedure Laterality Date   BREAST BIOPSY Left 6/00   [place]   BREAST EXCISIONAL BIOPSY Left 1998   [place] Right 10/97   [place]   left breast biopsy     ROOT CANAL  02/11/2012   TONSILLECTOMY  age 59   TONSILLECTOMY AND ADENOIDECTOMY  1952   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   TOTAL KNEE ARTHROPLASTY Right 11/08/2012   Procedure: RIGHT TOTAL KNEE ARTHROPLASTY;  Surgeon: Gearlean Alf, MD;  Location: [facility];  Service: Orthopedics;  Laterality: Right;   TUBAL LIGATION     Family History  Problem Relation Age of Onset   Breast cancer Mother    Aneurysm Father    Migraines Father    Breast cancer Maternal Aunt    Breast cancer Maternal Aunt    Breast cancer Maternal Aunt    Breast cancer Maternal Aunt    High blood pressure Brother    Melanoma Brother    Kidney cancer Brother        Lesion on kidney   Arrhythmia Brother    Heart disease Other        Grandmother   Heart attack Paternal Grandmother    Heart attack Paternal Grandfather    Social History   Socioeconomic History   Marital status: Married    Spouse name: [name]   Number of children: 0   Years of education: 14   Highest education level: Not on file  Occupational History   Not on file  Tobacco Use   Smoking status: Passive Smoke Exposure - Never Smoker   Smokeless tobacco: Never Used   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   Patient drinks 2-4 cups of coffee daily.   Patient is retired.   Patient has two years of college.   Patient is right-handed.               Social Determinants of Health   Financial Resource Strain:    Difficulty of Paying Living Expenses: Not on file  Food Insecurity:    Worried About Running Out  of Food in the Last Year: Not on file   Ran Out of Food in the Last Year: Not on file  Transportation Needs:    Lack of Transportation (Medical): Not on file   Lack of Transportation (Non-Medical): Not on file  Physical Activity:    Days of Exercise per Week: Not on file   Minutes of Exercise per Session: Not on file  Stress:    Feeling of Stress : Not on file  Social Connections:    Frequency of Communication with Friends and Family: Not on file   Frequency of Social Gatherings with Friends and Family: Not on file   Attends Religious Services: Not on file   Active Member of Clubs or Organizations: Not on file   Attends Archivist Meetings: Not on file   Marital Status: Not on file    Tobacco Counseling Counseling given: Not Answered Comment: flight attendant on smoking plane    Clinical Intake:  Pre-visit preparation completed: No  Pain : 0-10 Pain Score: 7  Pain Type: Chronic pain Pain Location: Generalized Pain Orientation:  (Neck,knee,feet,fingers) Pain Radiating Towards: no Pain Descriptors / Indicators: Aching Pain Onset:  (since 1980's) Pain Relieving Factors: tramdol,doxepin Effect of Pain on Daily Activities: " slows me down "  Pain Relieving Factors: tramdol,doxepin  BMI - recorded: 30.36 Nutritional Risks: None  How often do you need to have someone help you when you read instructions, pamphlets, or other written materials from your doctor or pharmacy?: 1 - Never What is  the last grade level you completed in school?: 2 yrs college  Diabetic?No   Interpreter Needed?: No  Information entered by :: Ariana Juul C Rochella Benner FNP-c   Activities of Daily Living In your present state of health, do you have any difficulty performing the following activities: 11/22/2019  Hearing? Y  Vision? Y  Comment does not see well at night follows Opthalmology  Difficulty concentrating or making decisions? Y  Comment forgetful  Walking or climbing stairs? N  Comment Hc of knee replacement  Dressing or bathing? N  Doing errands, shopping? N  Preparing Food and eating ? N  Using the Toilet? Y  Comment chronic constipation.  In the past six months, have you accidently leaked urine? Y  Do you have problems with loss of bowel control? N  Managing your Medications? N  Managing your Finances? N  Housekeeping or managing your Housekeeping? Y  Comment Chartered certified accountant  Some recent data might be hidden    [name], [name] Megan Salon, MD as Consulting Physician (Gynecology) Tiajuana Amass, MD as Referring Physician (Allergy and Immunology) Jari Pigg, MD as Consulting Physician (Dermatology) Luberta Mutter, MD as Consulting Physician (Ophthalmology) Plonk, Vivien Presto, MD as Referring Physician (Otolaryngology) Dohmeier, Asencion Partridge, MD as Consulting Physician (Neurology) Reynold Bowen, MD as Consulting Physician (Endocrinology) Lendon Colonel, NP as Nurse Practitioner (Nurse Practitioner)  Indicate any recent Medical Services you may have received from other than [facility] providers in the past year (date may be approximate).     Assessment:   This is a routine wellness examination for Laquasia.  Hearing/Vision screen  Hearing Screening   125Hz  250Hz  500Hz  1000Hz  2000Hz  3000Hz  4000Hz  6000Hz  8000Hz   Right ear:           Left ear:           Comments: No Hearing Concerns.   Vision Screening Comments: No Vision  Concerns. Patient wears reading  glasses.   Dietary issues and exercise activities discussed: Current Exercise Habits: Home exercise routine, Type of exercise: Other - see comments (water aerobic), Time (Minutes): 60, Frequency (Times/Week): 5, Weekly Exercise (Minutes/Week): 300, Intensity: Moderate  Goals     Weight (lb) < 200 lb (90.7 kg)     I would like to loss weight down to 170's lbs       Depression Screen PHQ 2/9 Scores 11/22/2019 11/17/2018 09/20/2018 02/22/2018 12/14/2017 10/28/2017 08/17/2017  PHQ - 2 Score 0 0 0 0 0 0 0  PHQ- 9 Score - - - - - - -  Exception Documentation - - - - - - -    Fall Risk Fall Risk  11/22/2019 11/17/2018 09/20/2018 02/22/2018 12/14/2017  Falls in the past year? 1 0 0 1 Yes  Number falls in past yr: 1 0 0 0 1  Comment - - - - -  Injury with Fall? 0 0 0 1 Yes  Risk for fall due to : - - - History of fall(s);Impaired balance/gait;Medication side effect -  Follow up - - - Falls evaluation completed;Education provided;Falls prevention discussed -  Comment - - - decrease alcohol intake and several meds from other providers has been suggested on numerous occasions, but then pt sees them and they restart them -    Any stairs in or around the home? Yes  If so, are there any without handrails? Yes  Home free of loose throw rugs in walkways, pet beds, electrical cords, etc? No  Adequate lighting in your home to reduce risk of falls? Yes   ASSISTIVE DEVICES UTILIZED TO PREVENT FALLS:  Life alert? No  Use of a cane, walker or w/c? No  Grab bars in the bathroom? No  Shower chair or bench in shower? Yes  Elevated toilet seat or a handicapped toilet? Yes   TIMED UP AND GO:  Was the test performed? No .  Length of time to ambulate 10 feet: N/A  sec.   Gait slow and steady without use of assistive device  Cognitive Function: MMSE - Mini Mental State Exam 10/28/2017 09/23/2016 08/31/2015  Orientation to time 5 5 4   Orientation to Place 5 5 5   Registration 3  3 3   Attention/ Calculation 5 5 4   Recall 2 2 3   Language- name 2 objects 2 2 2   Language- repeat 1 1 1   Language- follow 3 step command 3 3 1   Language- read & follow direction 1 1 1   Write a sentence 1 1 1   Copy design 1 1 1   Total score 29 29 26      6CIT Screen 11/22/2019 11/17/2018  What Year? 0 points 0 points  What month? 0 points 0 points  What time? 0 points 0 points  Count back from 20 0 points 0 points  Months in reverse 0 points 0 points  Repeat phrase 2 points 0 points  Total Score 2 0    Immunizations Immunization History  Administered Date(s) Administered   Fluad Quad(high Dose 65+) 11/04/2018, 11/21/2019   Influenza, High Dose Seasonal PF 12/08/2016, 10/28/2017   Influenza,inj,quad, With Preservative 10/26/2018   Pneumococcal Conjugate-13 10/18/2013   Pneumococcal Polysaccharide-23 11/01/2012   Tdap 10/10/2010, 05/30/2017   Zoster 02/24/2002   Zoster Recombinat (Shingrix) 06/24/2016    TDAP status: Up to date Flu Vaccine status: Up to date Pneumococcal vaccine status: Up to date Covid-19 vaccine status: Completed vaccines  Qualifies for Shingles Vaccine? Yes   Zostavax completed Yes  Shingrix Completed?: Yes  Screening Tests Health Maintenance  Topic Date Due   Hepatitis C Screening  Never done   COVID-19 Vaccine (1) Never done   MAMMOGRAM  05/29/2020   COLONOSCOPY  07/14/2021   TETANUS/TDAP  05/31/2027   DEXA SCAN  Completed   PNA vac Low Risk Adult  Completed    Health Maintenance  Health Maintenance Due  Topic Date Due   Hepatitis C Screening  Never done   COVID-19 Vaccine (1) Never done    Colorectal cancer screening: No longer required.  Mammogram status: Completed 05/30/2019. Repeat every year Bone Density status: Completed 02/12/2017. Results reflect: Bone density results: OSTEOPENIA. Repeat every 2 years.  Lung Cancer Screening: (Low Dose CT Chest recommended if Age 79-80 years, 30 pack-year currently smoking OR  have quit w/in 15years.) does not qualify.   Lung Cancer Screening Referral: No   Additional Screening:  Hepatitis C Screening: does qualify; Completed to be done with lab work   Vision Screening: Recommended annual ophthalmology exams for early detection of glaucoma and other disorders of the eye. Is the patient up to date with their annual eye exam?  Yes  Who is the provider or what is the name of the office in which the patient attends annual eye exams? Dr.MCQuen  If pt is not established with a provider, would they like to be referred to a provider to establish care? No .   Dental Screening: Recommended annual dental exams for proper oral hygiene  Community Resource Referral / Chronic Care Management: CRR required this visit?  No   CCM required this visit?  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 - Nutritionist for Obesity would like  Education from Nutritionist to help with her diet    I have personally reviewed and noted the following in the patients chart:    Medical and social history  Use of alcohol, tobacco or illicit drugs   Current medications and supplements  Functional ability and status  Nutritional status  Physical activity  Advanced directives  List of other physicians  Hospitalizations, surgeries, and ER visits in previous 12 months  Vitals  Screenings to include cognitive, depression, and falls  Referrals and appointments  In addition, I have reviewed and discussed with patient certain preventive protocols, quality metrics, and best practice recommendations. A written personalized care plan for preventive services as well as general preventive health recommendations were provided to patient.     Sandrea Hughs, NP   11/22/2019   Nurse Notes: Referral ordered to Nutritionist

## 2019-11-22 NOTE — Telephone Encounter (Signed)
Ms. malaysha, arlen are scheduled for a virtual visit with your provider today.    Just as we do with appointments in the office, we must obtain your consent to participate.  Your consent will be active for this visit and any virtual visit you may have with one of our providers in the next 365 days.    If you have a MyChart account, I can also send a copy of this consent to you electronically.  All virtual visits are billed to your insurance company just like a traditional visit in the office.  As this is a virtual visit, video technology does not allow for your provider to perform a traditional examination.  This may limit your provider's ability to fully assess your condition.  If your provider identifies any concerns that need to be evaluated in person or the need to arrange testing such as labs, EKG, etc, we will make arrangements to do so.    Although advances in technology are sophisticated, we cannot ensure that it will always work on either your end or our end.  If the connection with a video visit is poor, we may have to switch to a telephone visit.  With either a video or telephone visit, we are not always able to ensure that we have a secure connection.   I need to obtain your verbal consent now.   Are you willing to proceed with your visit today?   Sarin [place]un[place]ham has provided verbal consent on 11/22/2019 for a virtual visit (video or telephone).   [place], [place] 11/22/2019  10:00 AM

## 2019-11-24 ENCOUNTER — Encounter: Payer: Self-pay | Admitting: Internal Medicine

## 2019-11-28 ENCOUNTER — Ambulatory Visit: Payer: Self-pay | Admitting: Internal Medicine

## 2019-12-05 ENCOUNTER — Ambulatory Visit: Payer: Self-pay | Admitting: Internal Medicine

## 2019-12-12 ENCOUNTER — Other Ambulatory Visit: Payer: Self-pay

## 2019-12-12 ENCOUNTER — Ambulatory Visit (INDEPENDENT_AMBULATORY_CARE_PROVIDER_SITE_OTHER): Payer: Medicare HMO | Admitting: Internal Medicine

## 2019-12-12 ENCOUNTER — Encounter: Payer: Self-pay | Admitting: Internal Medicine

## 2019-12-12 VITALS — BP 118/68 | HR 79 | Temp 97.8°F | Ht 66.0 in | Wt 186.6 lb

## 2019-12-12 DIAGNOSIS — K5909 Other constipation: Secondary | ICD-10-CM

## 2019-12-12 DIAGNOSIS — G4733 Obstructive sleep apnea (adult) (pediatric): Secondary | ICD-10-CM

## 2019-12-12 DIAGNOSIS — Z9989 Dependence on other enabling machines and devices: Secondary | ICD-10-CM

## 2019-12-12 DIAGNOSIS — M542 Cervicalgia: Secondary | ICD-10-CM

## 2019-12-12 DIAGNOSIS — E6609 Other obesity due to excess calories: Secondary | ICD-10-CM

## 2019-12-12 DIAGNOSIS — G43009 Migraine without aura, not intractable, without status migrainosus: Secondary | ICD-10-CM | POA: Diagnosis not present

## 2019-12-12 DIAGNOSIS — Z6830 Body mass index (BMI) 30.0-30.9, adult: Secondary | ICD-10-CM

## 2019-12-12 NOTE — Progress Notes (Signed)
Location:  [facility] clinic Provider:  Kevia Zaucha L. Mariea Clonts, D.O., C.M.D.  Goals of Care:  Advanced Directives 12/12/2019  Does Patient Have a Medical Advance Directive? Yes  Type of Advance Directive Out of facility DNR (pink MOST or yellow form);Healthcare Power of Attorney  Does patient want to make changes to medical advance directive? No - Patient declined  Copy of [place] in Chart? Yes - validated most recent copy scanned in chart (See row information)  Would patient like information on creating a medical advance directive? -  Pre-existing out of facility DNR order (yellow form or pink MOST form) Pink MOST/Yellow Form most recent copy in chart - Physician notified to receive inpatient order  Some encounter information is confidential and restricted. Go to Review Flowsheets activity to see all data.     Chief Complaint  Patient presents with  . Health Maintenance    hepatitis C Screening   . Medical Management of Chronic Issues    Follow up    HPI: Patient is a 79 y.o. female seen today for medical management of chronic diseases.  Needs her CPE but was when called was scheduled for routine visit.  Hearing has gotten worse--can't even hear her husband.  Is going to f/u at the [facility] audiologist.  He is realizing what a disadvantage it is.    She is trying to keep up with her husband.  He's like a spinning top on it all day--very active.  It takes her longer to do things and has to concentrate to do things.  Feels 79.    One of her friends has AD now and had a fall recently--has a listeria infection in her bloodstream.    She's on her regimen with Dr. Earlean Shawl for her constipation--amitiza two per day and 4 scoops of miralax and sometimes still has to eat digestive cookies.    Breathing is good.  Uses her CPAP at hs.  Takes singulair.  Only gets up once at night to urinate and drinks about a gallon of water daily.  Doing well with sleep with her 20mg  melatonin and  tramadol for pain and lorazepam and doxepin and generic lunesta at 9pm.  Goes to sleep around 11-1130pm.  If she misses any of those, she's awake all night.  She has a pillbox.  Was forgetting if she took meds or not.    Headaches:  Doing really well with Dr. Oneida Alar' regimen for this.  Booster last Thursday gave her a headache.    Dr. Forde Dandy will see her next week.  She's going to ask him to send me his notes.  Dorita Fray is a new option. She took the samples he gave her and in 35 mins headache, lights flashing and all were over and he wrote her a Rx.  It was $1100 for 10 pills.  Dr. Forde Dandy talked with them and they approved it for her with her extensive long-term issues with migraines.    She is walking and getting in the pool a few days per week.  She's trying to do 1200 calories.  Will do well, but when she's out she does not have discipline.  She is down just a few lbs.    Had a fall 6 wks ago at [facility].  Pt her hand on a rod that was holding together the chains separating people.  [place] over and hurt her pride and a scrape on her elbow.    Past Medical History:  Diagnosis Date  .  Cancer ([place])    basal cell skin biopsies X2  . 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 Constipation   . Depression   . Fibromyalgia 8/98   [place]  . 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 Impaired hearing    left ear, wears hearing aids  . Insomnia    circadian rhythm component  . Insomnia   . Migraine 10/88   [place]  . Nuclear sclerosis   . OSA on CPAP 2/07   uses cpap setting of 10  . Osteoarthritis 2007   [place]  . Pain last week   left under breast pain   . Plantar fasciitis   . Scarlet fever as child  . Thyroid disorder   . Vitreous degeneration of right eye     Past Surgical History:  Procedure Laterality Date  . BREAST BIOPSY Left 6/00   [place]  . BREAST EXCISIONAL BIOPSY Left 1998  . DE QUERVAIN'S RELEASE Right 10/97   [place]  . left breast biopsy    . ROOT CANAL   02/11/2012  . TONSILLECTOMY  age 55  . TONSILLECTOMY AND ADENOIDECTOMY  1952  . 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 TOTAL KNEE ARTHROPLASTY Right 11/08/2012   Procedure: RIGHT TOTAL KNEE ARTHROPLASTY;  Surgeon: Gearlean Alf, MD;  Location: [facility] ORS;  Service: Orthopedics;  Laterality: Right;  . TUBAL LIGATION      Allergies  Allergen Reactions  . Penicillins Anaphylaxis    Tongue swelling  . Codeine Nausea Only  . Oxycodone-Acetaminophen   . Penicillamine   . Provigil [Modafinil]   . Seroquel [Quetiapine Fumarate]   . Xyrem [Oxybate]     hallucination  . Ciprofloxacin Rash  . Monistat [Miconazole] Rash  . Terazol [Terconazole] Rash    Outpatient Encounter Medications as of 12/12/2019  Medication Sig  . aspirin EC 81 MG tablet Take 81 mg by mouth daily.  . Aspirin-Acetaminophen-Caffeine (EXCEDRIN MIGRAINE PO) Take 2 capsules by mouth as needed.   . Cholecalciferol (VITAMIN D3) 5000 units CAPS Take by mouth once a week.   Marland Kitchen DIFLUCAN 150 MG tablet Brand only. 1 tablet po prn, may repeat x 1 after 72 hours if needed.  . doxepin (SINEQUAN) 100 MG capsule TAKE 1 CAPSULE BY MOUTH AT BEDTIME  . doxepin (SINEQUAN) 50 MG capsule Take 1 capsule (50 mg) by mouth when you wake up daily. Take 1 capsule (50 mg) by mouth around 2 pm daily. Take 2 capsules (100 mg) at bedtime daily.  . Eszopiclone (ESZOPICLONE) 3 MG TABS Take 3 mg by mouth at bedtime. Take immediately before bedtime  . levothyroxine (SYNTHROID, LEVOTHROID) 200 MCG tablet Take 200 mcg by mouth daily before breakfast.  . LORazepam (ATIVAN) 1 MG tablet Take 1 tablet (1 mg total) by mouth 2 (two) times daily.  Marland Kitchen MELATONIN PO Take 20 mg by mouth at bedtime.   . montelukast (SINGULAIR) 10 MG tablet Take 10 mg by mouth every evening.  . polyethylene glycol (MIRALAX / GLYCOLAX) packet Take 17 g by mouth daily.  . traMADol (ULTRAM) 50 MG tablet Take by mouth in the morning, at noon, and at bedtime.  . valACYclovir (VALTREX) 500 MG  tablet TAKE 1 TABLET EVERY DAY INCREASE TO 1 TABLET TWICE DAILY FOR 3 DAYS AS NEEDED  . [DISCONTINUED] traMADol (ULTRAM) 50 MG tablet Take one tab by mouth four times a day as needed (Patient taking differently: Take one tab by mouth  3 times a day as needed)  No facility-administered encounter medications on file as of 12/12/2019.    Review of Systems:  Review of Systems  Constitutional: Negative for chills, fever and malaise/fatigue.  HENT: Positive for hearing loss. Negative for congestion and sore throat.   Eyes: Negative for blurred vision.  Respiratory: Negative for cough and shortness of breath.   Cardiovascular: Negative for chest pain, palpitations and leg swelling.  Gastrointestinal: Positive for constipation. Negative for abdominal pain, blood in stool, diarrhea and melena.  Genitourinary: Negative for dysuria.  Musculoskeletal: Positive for falls, joint pain, myalgias and neck pain.  Skin: Negative for itching and rash.  Neurological: Positive for headaches. Negative for dizziness, loss of consciousness and weakness.  Psychiatric/Behavioral: Negative for depression and memory loss. The patient is nervous/anxious and has insomnia.     Health Maintenance  Topic Date Due  . Hepatitis C Screening  Never done  . MAMMOGRAM  05/29/2020  . COLONOSCOPY  07/14/2021  . TETANUS/TDAP  05/31/2027  . DEXA SCAN  Completed  . COVID-19 Vaccine  Completed  . PNA vac Low Risk Adult  Completed    Physical Exam: Vitals:   12/12/19 1451  BP: 118/68  Pulse: 79  Temp: 97.8 F (36.6 C)  SpO2: 96%  Weight: 186 lb 9.6 oz (84.6 kg)  Height: 5\' 6"  (1.676 m)   Body mass index is 30.12 kg/m. Physical Exam Vitals reviewed.  Constitutional:      Appearance: Normal appearance. She is obese.  HENT:     Head: Normocephalic and atraumatic.  Eyes:     Extraocular Movements: Extraocular movements intact.     Conjunctiva/sclera: Conjunctivae normal.     Pupils: Pupils are equal, round, and  reactive to light.  Cardiovascular:     Rate and Rhythm: Normal rate and regular rhythm.     Pulses: Normal pulses.     Heart sounds: Normal heart sounds.  Pulmonary:     Effort: Pulmonary effort is normal.     Breath sounds: Normal breath sounds. No wheezing, rhonchi or rales.  Abdominal:     General: Bowel sounds are normal.     Palpations: Abdomen is soft.     Tenderness: There is no abdominal tenderness.  Musculoskeletal:        General: Normal range of motion.     Cervical back: Neck supple.     Right lower leg: No edema.     Left lower leg: No edema.  Lymphadenopathy:     Cervical: No cervical adenopathy.  Skin:    General: Skin is warm and dry.  Neurological:     General: No focal deficit present.     Mental Status: She is alert and oriented to person, place, and time. Mental status is at baseline.     Cranial Nerves: No cranial nerve deficit.     Motor: No weakness.     Gait: Gait normal.  Psychiatric:        Mood and Affect: Mood normal.        Behavior: Behavior normal.     Labs reviewed: Basic Metabolic Panel: Recent Labs    07/04/19 1051 08/05/19 1536  NA 143 140  K 4.9 4.7  CL 106 103  CO2 23 23  GLUCOSE 97 94  BUN 22 15  CREATININE 0.62 0.56*  CALCIUM 9.5 9.4   Liver Function Tests: No results for input(s): AST, ALT, ALKPHOS, BILITOT, PROT, ALBUMIN in the last 8760 hours. No results for input(s): LIPASE, AMYLASE in the last 8760 hours. No results  for input(s): AMMONIA in the last 8760 hours. CBC: No results for input(s): WBC, NEUTROABS, HGB, HCT, MCV, PLT in the last 8760 hours. Lipid Panel: No results for input(s): CHOL, HDL, LDLCALC, TRIG, CHOLHDL, LDLDIRECT in the last 8760 hours. Lab Results  Component Value Date   HGBA1C 5.5 08/27/2015    Procedures since last visit: No results found.  Assessment/Plan 1. Migraines -better with new medication from Dr. Forde Dandy  2. OSA on CPAP -continue, doing fine  3. Cervicalgia -chronic, on  extensive regimen of beer's list meds that help this and attempts to wean them have resulted in increased pain and headaches  4. Class 1 obesity due to excess calories without serious comorbidity with body mass index (BMI) of 30.0 to 30.9 in adult -is doing a 1200 calorie diet except when she goes out and can't stick to it -walking and doing water exercise  5. BMI 30.0-30.9,adult - ongoing, still trying to lose weight, is at upper limit recommended in her age group  70. Chronic constipation -cont amitiza and miralax and occasional fiber wafers, hydration and exercise   Labs/tests ordered:  None here b/c gets with Dr. Forde Dandy and will have them sent here  Next appt:  1 year for CPE  Geneviene Tesch L. Robert Sunga, D.O. [facility] [address], [place] [address] Cell Phone (Mon-Fri 8am-5pm):  [phone] On Call:  [phone] & follow prompts after 5pm & weekends Office Phone:  [phone] Office Fax:  [phone]

## 2019-12-13 ENCOUNTER — Ambulatory Visit: Payer: Medicare HMO | Admitting: Sports Medicine

## 2020-01-10 ENCOUNTER — Ambulatory Visit: Payer: Medicare HMO | Admitting: Sports Medicine

## 2020-01-18 ENCOUNTER — Ambulatory Visit: Payer: Medicare HMO | Admitting: Neurology

## 2020-01-24 ENCOUNTER — Other Ambulatory Visit: Payer: Self-pay | Admitting: Sports Medicine

## 2020-01-24 ENCOUNTER — Encounter: Payer: Self-pay | Admitting: Neurology

## 2020-01-24 ENCOUNTER — Ambulatory Visit (INDEPENDENT_AMBULATORY_CARE_PROVIDER_SITE_OTHER): Payer: Medicare HMO | Admitting: Neurology

## 2020-01-24 VITALS — BP 119/76 | HR 82 | Ht 66.0 in | Wt 189.5 lb

## 2020-01-24 DIAGNOSIS — M5030 Other cervical disc degeneration, unspecified cervical region: Secondary | ICD-10-CM

## 2020-01-24 DIAGNOSIS — G478 Other sleep disorders: Secondary | ICD-10-CM

## 2020-01-24 DIAGNOSIS — G44221 Chronic tension-type headache, intractable: Secondary | ICD-10-CM

## 2020-01-24 MED ORDER — TRAMADOL HCL 50 MG PO TABS: 50.0000 mg | ORAL_TABLET | Freq: Three times a day (TID) | ORAL | 4 refills | Status: DC

## 2020-01-24 NOTE — Patient Instructions (Signed)
Tension Headache, Adult A tension headache is a feeling of pain, pressure, or aching in the head that is often felt over the front and sides of the head. The pain can be dull, or it can feel tight (constricting). There are two types of tension headache:  Episodic tension headache. This is when the headaches happen fewer than 15 days a month.  Chronic tension headache. This is when the headaches happen more than 15 days a month during a 110-month period. A tension headache can last from 30 minutes to several days. It is the most common kind of headache. Tension headaches are not normally associated with nausea or vomiting, and they do not get worse with physical activity. What are the causes? The exact cause of this condition is not known. Tension headaches are often triggered by stress, anxiety, or depression. Other triggers include:  Alcohol.  Too much caffeine or caffeine withdrawal.  Respiratory infections, such as colds, flu, or sinus infections.  Dental problems or teeth clenching.  Tiredness (fatigue).  Holding your head and neck in the same position for a long period of time, such as while using a computer.  Smoking.  Arthritis of the neck. What are the signs or symptoms? Symptoms of this condition include:  A feeling of pressure or tightness around the head.  Dull, aching head pain.  Pain over the front and sides of the head.  Tenderness in the muscles of the head, neck, and shoulders. How is this diagnosed? This condition may be diagnosed based on your symptoms, your medical history, and a physical exam. If your symptoms are severe or unusual, you may have imaging tests, such as a CT scan or an MRI of your head. Your vision may also be checked. How is this treated? This condition may be treated with lifestyle changes and with medicines that help relieve symptoms. Follow these instructions at home: Managing pain  Take over-the-counter and prescription medicines only as  told by your health care provider.  When you have a headache, lie down in a dark, quiet room.  If directed, apply ice to the head and neck: ? Put ice in a plastic bag. ? Place a towel between your skin and the bag. ? Leave the ice on for 20 minutes, 2-3 times a day.  If directed, apply heat to the back of your neck as often as told by your health care provider. Use the heat source that your health care provider recommends, such as a moist heat pack or a heating pad. ? Place a towel between your skin and the heat source. ? Leave the heat on for 20-30 minutes. ? Remove the heat if your skin turns bright red. This is especially important if you are unable to feel pain, heat, or cold. You may have a greater risk of getting burned. Eating and drinking  Eat meals on a regular schedule.  Limit alcohol intake to no more than 1 drink a day for nonpregnant women and 2 drinks a day for men. One drink equals 12 oz of beer, 5 oz of wine, or 1 oz of hard liquor.  Drink enough fluid to keep your urine pale yellow.  Decrease your caffeine intake, or stop using caffeine. Lifestyle  Get 7-9 hours of sleep each night, or get the amount of sleep recommended by your health care provider.  At bedtime, remove all electronic devices from your room. Electronic devices include computers, phones, and tablets.  Find ways to manage your stress. Some things  that can help relieve stress include: ? Exercise. ? Deep breathing exercises. ? Yoga. ? Listening to music. ? Positive mental imagery.  Try to sit up straight and avoid tensing your muscles.  Do not use any products that contain nicotine or tobacco, such as cigarettes and e-cigarettes. If you need help quitting, ask your health care provider. General instructions   Keep all follow-up visits as told by your health care provider. This is important.  Avoid any headache triggers. Keep a headache journal to help find out what may trigger your headaches.  For example, write down: ? What you eat and drink. ? How much sleep you get. ? Any change to your diet or medicines. Contact a health care provider if:  Your headache does not get better.  Your headache comes back.  You are sensitive to sounds, light, or smells because of a headache.  You have nausea or you vomit.  Your stomach hurts. Get help right away if:  You suddenly develop a very severe headache along with any of the following: ? A stiff neck. ? Nausea and vomiting. ? Confusion. ? Weakness. ? Double vision or loss of vision. ? Shortness of breath. ? Rash. ? Unusual sleepiness. ? Fever. ? Trouble speaking. ? Pain in your eyes or ears. ? Trouble walking or balancing. ? Feeling faint or passing out. Summary  A tension headache is a feeling of pain, pressure, or aching in the head that is often felt over the front and sides of the head.  A tension headache can last from 30 minutes to several days. It is the most common kind of headache.  This condition may be diagnosed based on your symptoms, your medical history, and a physical exam.  This condition may be treated with lifestyle changes and with medicines that help relieve symptoms. This information is not intended to replace advice given to you by your health care provider. Make sure you discuss any questions you have with your health care provider. Document Revised: 12/08/2018 Document Reviewed: 05/23/2016 Elsevier Patient Education  [employer].

## 2020-01-24 NOTE — Progress Notes (Signed)
Trigger point injections per Dr Brett Fairy. Bupivacaine 0.5 %, lot # [id number], exp 04/2021. Methylprednisolone 80 mg/ml, lot#[id number] x 2 , exp 01/2020.

## 2020-01-24 NOTE — Progress Notes (Addendum)
SLEEP MEDICINE CLINIC   Provider:  Larey Seat, M D  Primary Care Physician:  Gayland Curry, DO   Referring Provider: Gayland Curry, DO   Chief Complaint  Patient presents with  . Follow-up    pt alone, rm 11. pt states that machine is doing well. she uses it nightly. DME [facility]. she had covid in Jan and since then has struggled with headaches that have caused light sensitivity. she is working with another MD trying to get them under control. med changes were updated.    HPI:  [name] is a 79 y.o. female , seen here for headaches, migraines,  that she relates to post-Covid. She had a COVID infection in January and has had headaches since.  The headaches are associated with photophobia and nausea.  She has been told her cervical disc degeneration is at cause.  She is willing to undergo steroid and lidocaine trigger points today.       06-15-2019:  She was told by Brenda Blower, MD, to treat these aggressively - Doxepin at 100 mg at night- but she still has headaches.  When I encountered [name] last in October 2020 she had actually been headache free for a while after starting on doxepin at a lower dose.  She feels well exhausted, feels a complete lack of energy.  Outdoor activities just walking outdoors also are aggravating her allergies.  She has been a compliant CPAP user as usual with over 90% compliance and average use at time of 8 hours and 40 minutes with a minimum pressure of 5, a maximum pressure setting of 11 cmH2O and 2 cm EPR and a residual AHI of only 1.2.  Her air leaks are low and her pressure at the 91st percentile is 9.5.  There needs to be no adjustment done for the CPAP part.  Her fatigue has exacerbated she endorsed a fatigue score of 41, but she is not excessively daytime sleepy.  She reports loss of taste and smell.  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  She endorsed depression of 4 out of 15 - so not a clinical level of concern.    Interval  11/2018 [name] reports improvement in headaches after Dr. Oneida Alar has started her on doxepin. She sleeps well, her headaches are gone. CPAP : She went to [place] and used machine every single night.  EPWORTH 1. [name] has used her machine daily for the last 30 days including 08 December 2018, average use at time 8 hours 58 minutes, her minimum pressure setting is for maximum pressure setting 10 cmH2O this 2 cm expiratory pressure relief and a resulting AHI of 2.5 there is no air leak.  The resulting AHI is mostly related to obstructive apneas.  The 95th percentile pressure is at 9.2 cmH2O, which makes me think that we need to increase her maximum pressure setting x1 cm the new window will be for up to 11 cmH2O is 2 cm EPR.    08-11-2018:[name] is a 79 y.o. female , seen here for a new concern about a recent fall- retropusive, hit her head on a paved brick pavement, all this happened on June 6th at her husband's high school reunion in [place], [place].  Patient reports that she initially felt not as much pain, she has spit up by herself went to her bedroom and rested, but after 2 or 3 days she developed a headache associated with some sickness to her stomach  and some photophobia but must be migrainous in origin.  She did not break any bones, her pupils are of similar size, and she does not have a focal weakness or sensory abnormality. She hasn't had a migraine for almost one year.  She is not dizzy- but walks with sunglasses.  She needs some intervention  for her migraine and a CT of the had to rule out any bleed.   04-15-2017, I have the pleasure of meeting with [name] today, for a regular CPAP CPAP compliance visit.  The patient now uses an AutoSet between 4 and 10 cmH2O pressure was 1 cm EPR, she has achieved a compliance of 100% with an average use at time of 9 hours and 10 minutes each night.  Her residual AHI is 1.5/h, which is a very good resolution.  95th percentile pressure is  9.7 cm water air leaks are medium. Epworth 2, FSS 41.  She loves the new CPAP auto- machine.  Here is the result of her sleep study:   NAME:  [name]                                                      DOB: 09-23-1940 MEDICAL RECORD NUMBER [id number]                                 DOS: 01/31/2017 REFERRING PHYSICIAN: Hollace Kinnier, D.O. BMI: 30.6  STUDY RESULTS: Total Recording Time: 10 hours, 51 minutes,  Total Apnea/Hypopnea Index (AHI):   8.2/ hr. RDI 10.5 /hr.  Average Oxygen Saturation:    92 % Lowest Oxygen Desaturation:  82 %; Total Time Oxygen Saturation below 89%:  19 minutes Average Heart Rate:   64 bpm (40-161 bpm).     Social History   Socioeconomic History  . Marital status: Married    Spouse name: [name]  . Number of children: 0  . Years of education: 80  . Highest education level: Not on file  Occupational History  . Not on file  Tobacco Use  . Smoking status: Passive Smoke Exposure - Never Smoker  . Smokeless tobacco: Never Used  . 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 Vaping Use: Never used  Substance and Sexual Activity  . Alcohol use: Yes    Alcohol/week: 0.0 standard drinks    Comment: 1 glass - occas of vodka  . Drug use: No  . Sexual activity: Not Currently    Partners: Male    Birth control/protection: Post-menopausal  Other Topics Concern  . Not on file  Social History Narrative   Patient is married [name]).   Patient drinks 2-4 cups of coffee daily.   Patient is retired.   Patient has two years of college.   Patient is right-handed.               Social Determinants of Health   Financial Resource Strain:   . Difficulty of Paying Living Expenses: Not on file  Food Insecurity:   . Worried About Charity fundraiser in the Last Year: Not on file  . Ran Out of Food in the Last Year: Not on file  Transportation Needs:   . Lack of Transportation (Medical): Not on file  .  Lack of Transportation (Non-Medical):  Not on file  Physical Activity:   . Days of Exercise per Week: Not on file  . Minutes of Exercise per Session: Not on file  Stress:   . Feeling of Stress : Not on file  Social Connections:   . Frequency of Communication with Friends and Family: Not on file  . Frequency of Social Gatherings with Friends and Family: Not on file  . Attends Religious Services: Not on file  . Active Member of Clubs or Organizations: Not on file  . Attends Archivist Meetings: Not on file  . Marital Status: Not on file  Intimate Partner Violence:   . Fear of Current or Ex-Partner: Not on file  . Emotionally Abused: Not on file  . Physically Abused: Not on file  . Sexually Abused: Not on file     Vitals: BP 119/76   Pulse 82   Ht 5\' 6"  (1.676 m)   Wt 189 lb 8 oz (86 kg)   LMP 02/25/1995 (Approximate)   BMI 30.59 kg/m  Last Weight:  Wt Readings from Last 1 Encounters:  01/24/20 189 lb 8 oz (86 kg)   IZT:IWPY mass index is 30.59 kg/m.     Last Height:   Ht Readings from Last 1 Encounters:  01/24/20 5\' 6"  (1.676 m)    Physical exam: The patient feels hot to the touch and is flushed. There is a tremor in both hands.   General: The patient is awake, alert and appears not in acute distress. The patient is well groomed. Head: Normocephalic, atraumatic. Neck is supple. Mallampati 4  neck circumference:15.5 . Nasal airflow patent.  Cardiovascular:  Regular rate and rhythm , without  murmurs or carotid bruit, and without distended neck veins. Respiratory: Lungs are clear to auscultation. Skin:  Without evidence of edema, or rash Trunk: BMI is  30.18- .   Neurologic exam : The patient is awake and alert, oriented to place and time.   MOCA:No flowsheet data found. MMSE: MMSE - Mini Mental State Exam 10/28/2017 09/23/2016 08/31/2015  Orientation to time 5 5 4   Orientation to Place 5 5 5   Registration 3 3 3   Attention/ Calculation 5 5 4   Recall 2 2 3   Language- name 2 objects 2 2 2     Language- repeat 1 1 1   Language- follow 3 step command 3 3 1   Language- read & follow direction 1 1 1   Write a sentence 1 1 1   Copy design 1 1 1   Total score 29 29 26       Attention span & concentration ability appears normal.  Speech is fluent, Mood and affect are appropriate.  Cranial nerves: Pupils are equal and briskly reactive to light. Funduscopic exam without evidence of pallor or edema. Extraocular movements  in vertical and horizontal planes intact and without nystagmus. Visual fields by finger perimetry are intact. Hearing to finger rub intact. Facial sensation intact to fine touch. Facial motor strength is symmetric and tongue and uvula move midline. Shoulder shrug was asymmetrical.  Neck and shoulders are stiff.- extremely tense.   Motor exam:  Normal tone, muscle bulk and symmetric strength in all extremities. Sensory:  Fine touch, pinprick and vibration were tested in all extremities. Proprioception tested in the upper extremities was normal. Coordination: Rapid alternating movements in the fingers/hands was normal. Finger-to-nose maneuver  normal without evidence of ataxia, dysmetria or tremor. Gait and station: Patient walks without assistive device . Deep tendon reflexes: in the  upper and lower extremities are symmetric and intact. I avoided the patella.  Babinski maneuver response is downgoing.   Assessment:  After physical and neurologic examination, review of laboratory studies,  Personal review of imaging studies, reports of other /same  Imaging studies, results of polysomnography and / or neurophysiology testing and pre-existing records as far as provided in visit;   The patient had a comprehensive metabolic panel with her primary care physician which showed an elevated cholesterol she would like to discuss the need for a cholesterol-lowering medication. I reviewed her current medications which include doxepin for sleep induction levothyroxine abdomen twice daily  vitamin D3 and a sleep aid eszopiclone. Tramadol as needed, Valtrex. Roselyn Meier for headaches at onset of migraine. Her fatigue severity scale was endorsed at 21 and her Epworth Sleepiness Scale at 1 point her geriatric depression score was endorsed at 4 out of 15 points,  The compliance report from his CPAP shows 100% compliance for days and time with an average use at time of 8 hours and 53 minutes, minimum pressure set at 5 maximum pressure at 11 cmH2O was 2 cm EPR her residual AHI is 1.8/h of very good resolution of apnea and the 95th percentile pressure that she uses is 9.7 cmH2O. So at this rate I do not need to make any adjustments the mask seems to fit well as well.   IMPRESSION:   1) Severe neck tension and known DDD, restricted ROM for retroflexion/ flexion at the neck and rotation.  Within 15 minutes after trigger point injections at the occipital region and nape of th neck the increase in ROM was evident.  2) Mild sleep apnea with snoring, desaturation and heart rate variability = AHI 8.2/hr . Continued CPAP therapy is indicated.  I ordered  Auto CPAP 4-10 cm water, 3 cm EPR, heated humidity and fit for dream wear interface.    3) post Covid - 3 month still having anosmia and ageusia.  She got the vaccine in the meantime/.   I spent more than 28 minutes of face to face time with the patient. Greater than 50% of time was spent in counseling and coordination of care. We have discussed the diagnosis and differential and I answered the patient's questions.    Plan:  Treatment plan and additional workup : continue using CPAP with the adjusted settings. .   We can repeat  trigger point injection for neck spasms. In 8- 16 weeks if needed.  Awaiting Dr Plovsky's input about the medication for sleep/ headaches .   Larey Seat, MD   31/54/0086, 76:19 AM  Certified in Neurology by ABPN Certified in [place] by [facility] [address], [place], [place] [address]

## 2020-01-31 ENCOUNTER — Ambulatory Visit: Payer: Medicare HMO | Admitting: Neurology

## 2020-01-31 ENCOUNTER — Other Ambulatory Visit: Payer: Self-pay

## 2020-01-31 MED ORDER — DOXEPIN HCL 100 MG PO CAPS: 100.0000 mg | ORAL_CAPSULE | Freq: Every day | ORAL | 1 refills | Status: DC

## 2020-01-31 MED ORDER — DOXEPIN HCL 50 MG PO CAPS: ORAL_CAPSULE | ORAL | 1 refills | Status: DC

## 2020-02-07 ENCOUNTER — Ambulatory Visit (INDEPENDENT_AMBULATORY_CARE_PROVIDER_SITE_OTHER): Payer: Medicare HMO | Admitting: Sports Medicine

## 2020-02-07 ENCOUNTER — Other Ambulatory Visit: Payer: Self-pay

## 2020-02-07 DIAGNOSIS — M47812 Spondylosis without myelopathy or radiculopathy, cervical region: Secondary | ICD-10-CM | POA: Diagnosis not present

## 2020-02-07 NOTE — Progress Notes (Signed)
CC: chronic neck pain and migraines  Patient continues follow up with Dr. Brett Fairy who has helped her sleep apnea and headache issues  On 11/30 she had trigger point injections for her chronic trapezius spasm and headaches.  This improved her neck ROM and tightness for 48 hours. However headaches continued daily.  She started back her tramadol tid and added excedrin migraine in evening if HA presisted.  She continues doxepin and that really suppresses the frequency of headaches.  With this medication schedule she feels the HAches are back in control. The neck tightness if more tolerable.  ROS Pool activity helps lessen neck spasm. Walking helps unless she does too much.  PE Pleasant older F in NAD BP 119/88   Ht 5\' 6"  (1.676 m)   Wt 189 lb (85.7 kg)   LMP 02/25/1995 (Approximate)   BMI 30.51 kg/m   Neck - ROM is limited but at her baseline/ Extension is limited and causes some pain Trapezius spasm noted bilaterally C5 to T1 strength testing is normal No pain with testing today

## 2020-02-07 NOTE — Assessment & Plan Note (Signed)
I think she has a stable medication regimen with tramadol tid Doxepin 50/50 and 200  Excedrin migraine for rescue  Try to resume pool exercises Walking on days not going to pool but limit to 30 mins or so to limit neck fatigue  Reck 2 months

## 2020-04-10 ENCOUNTER — Ambulatory Visit (INDEPENDENT_AMBULATORY_CARE_PROVIDER_SITE_OTHER): Payer: Medicare HMO | Admitting: Sports Medicine

## 2020-04-10 ENCOUNTER — Other Ambulatory Visit: Payer: Self-pay

## 2020-04-10 DIAGNOSIS — M5030 Other cervical disc degeneration, unspecified cervical region: Secondary | ICD-10-CM | POA: Diagnosis not present

## 2020-04-10 NOTE — Assessment & Plan Note (Signed)
We need to restart some of the neck and posture exercises Cont. Tramadol and Doxepin as before  Try the collar at times Try neck motion in the pool  Reck 2 mos

## 2020-04-10 NOTE — Patient Instructions (Signed)
To help speed recovery let's work neck exercises daily  1. Posture - head and shoulders back/ easy stretch 2. Motion - easy rotation and flexion and extension - keep shoulders back 3. Neck bending - once warmed up then with shoulders back - lean ear toward shoulder/ drop the shoulder blade when you do that one 4. Traction lift - hands under jaw - lift up to take pressure off neck  5 times for a count of ten  Medication change Increase the Excedrin Migraine formula to morning / 2pm/ 6 pm  Keep tramadol and Doxepin as before  See me in 2 months

## 2020-04-10 NOTE — Progress Notes (Signed)
Follow up Neck and Head ache  Has daily migraine sxs last 2 months Photophobia and light sensitivity No radicular sxs   Exercise Pool 5 days per week Walks 1 mile 4 days per week  This is a definite change in that she had gotten free of migraine prodromes and the chronic headache by treating the DJD of her neck  Often the flare for the migraine sxs is a change of pattern or travel  ROS No radicular sxs into arms No weakness in hands or arms  PE Pleasant Older F who appears in mild discomfort BP 117/63   Ht 5\' 6"  (1.676 m)   LMP 02/25/1995 (Approximate)   BMI 30.51 kg/m   Trapezius - moderate tightens bilat  Neck Extension limited with no pain\ Flexion feel tight Rotation very limited RT > LT Lateral bend limited bilat.  Bending and rotation are painful  C5 to T1 normal strength   Probs. Cerv. DDD - see problem list  2. Migraine sxs - these used to be severe We will add tid ecedrin migraind formula until she stops having assoc sxs

## 2020-04-16 ENCOUNTER — Encounter: Payer: Self-pay | Admitting: Internal Medicine

## 2020-05-03 NOTE — Progress Notes (Signed)
80 y.o. G0P0 Married White or Caucasian Not Hispanic or Latino female here for annual exam. Patient states that she is had extreme pain in her vagina on Saturday. She states that she laid down on a heating pain for 2 hours and it was much better and it has not come back since then. UA is pending.   The pain in her vagina was sharp and severe. Started to get better after ~45 minutes. Worse than the severe cramps she used to get.  Not sexually active.  She is on tramadol 3 x a day, long term issues with constipation. On daily miralax and other bowel medication. Has a BM every other day. No urinary frequency, urgency or pain. Drinking lots of water.  No vaginal bleeding.   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 Uses valtrex prn.  Her best fried died in the last few months. So sad about the loss.     Patient's last menstrual period was 02/25/1995 (approximate).          Sexually active: No.  The current method of family planning is post menopausal status.    Exercising: Yes.    pool 2x a week  Smoker:  no  Health Maintenance: Pap:   02-14-16 WNL NEG HR HPV History of abnormal Pap:  Yes years ago, no surgery on her cervix  MMG:   05/30/19 Density C Bi-rads 1 neg BMD:   12/2018Osteopenia, followed by endocrinologist. She was on fosamax in the past, she reports damage to her jaw bone. She has TMJ  Colonoscopy: 07/15/18 F/U 3 years  TDaP:  05/30/17 Gardasil: NA   reports that she is a non-smoker but has been exposed to tobacco smoke. She has been exposed to tobacco smoke for the past 30.00 years. She has never used smokeless tobacco. She reports current alcohol use. She reports that she does not use drugs. Rare ETOH   Past Medical History:  Diagnosis Date  . Cancer ([place] )    basal cell skin biopsies X2  . 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 Constipation   . Depression   . Fibromyalgia 8/98   [place]  . 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 Impaired hearing     left ear, wears hearing aids  . Insomnia    circadian rhythm component  . Insomnia   . Migraine 10/88   [place]illman  . Nuclear sclerosis   . OSA on CPAP 2/07   uses cpap setting of 10  . Osteoarthritis 2007   [place]  . Pain last week   left under breast pain   . Plantar fasciitis   . Scarlet fever as child  . Thyroid disorder   . Vitreous degeneration of right eye     Past Surgical History:  Procedure Laterality Date  . BREAST BIOPSY Left 6/00   [place]  . BREAST EXCISIONAL BIOPSY Left 1998  . DE QUERVAIN'S RELEASE Right 10/97   Sypher  . left breast biopsy    . ROOT CANAL  02/11/2012  . TONSILLECTOMY  age 30  . TONSILLECTOMY AND ADENOIDECTOMY  1952  . 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 TOTAL KNEE ARTHROPLASTY Right 11/08/2012   Procedure: RIGHT TOTAL KNEE ARTHROPLASTY;  Surgeon: Gearlean Alf, MD;  Location: [facility];  Service: Orthopedics;  Laterality: Right;  . TUBAL LIGATION      Current Outpatient Medications  Medication Sig Dispense Refill  . aspirin EC 81 MG tablet Take  81 mg by mouth daily.    . Aspirin-Acetaminophen-Caffeine (EXCEDRIN MIGRAINE PO) Take 2 capsules by mouth as needed.     . Cholecalciferol (VITAMIN D3) 5000 units CAPS Take by mouth once a week.     . doxepin (SINEQUAN) 100 MG capsule Take 1 capsule (100 mg total) by mouth at bedtime. 90 capsule 1  . doxepin (SINEQUAN) 50 MG capsule Take 1 capsule (50 mg) by mouth when you wake up daily. Take 1 capsule (50 mg) by mouth around 2 pm daily. Take 2 capsules (100 mg) at bedtime daily. 360 capsule 1  . Eszopiclone 3 MG TABS Take 3 mg by mouth at bedtime. Take immediately before bedtime    . levothyroxine (SYNTHROID, LEVOTHROID) 200 MCG tablet Take 200 mcg by mouth daily before breakfast.    . LORazepam (ATIVAN) 1 MG tablet Take 1 tablet (1 mg total) by mouth 2 (two) times daily. 30 tablet 1  . MELATONIN PO Take 20 mg by mouth at bedtime.     . montelukast (SINGULAIR) 10 MG tablet Take 10 mg by  mouth every evening.  3  . polyethylene glycol (MIRALAX / GLYCOLAX) packet Take 17 g by mouth daily.    . traMADol (ULTRAM) 50 MG tablet Take 1 tablet (50 mg total) by mouth in the morning, at noon, and at bedtime. 90 tablet 4  . UBRELVY 50 MG TABS Take by mouth as needed.    . valACYclovir (VALTREX) 500 MG tablet TAKE 1 TABLET EVERY DAY INCREASE TO 1 TABLET TWICE DAILY FOR 3 DAYS AS NEEDED 100 tablet 1   No current facility-administered medications for this visit.    Family History  Problem Relation Age of Onset  . Breast cancer Mother   . Aneurysm Father   . Migraines Father   . Breast cancer Maternal Aunt   . Breast cancer Maternal Aunt   . Breast cancer Maternal Aunt   . Breast cancer Maternal Aunt   . High blood pressure Brother   . Melanoma Brother   . Kidney cancer Brother        Lesion on kidney  . Arrhythmia Brother   . Heart disease Other        Grandmother  . Heart attack Paternal Grandmother   . Heart attack Paternal Grandfather     Review of Systems  Genitourinary: Positive for vaginal pain.  Psychiatric/Behavioral: Positive for dysphoric mood. The patient is nervous/anxious.   All other systems reviewed and are negative.   Exam:   Pulse 89   Ht 5\' 6"  (1.676 m)   Wt 190 lb 3.2 oz (86.3 kg)   LMP 02/25/1995 (Approximate)   SpO2 96%   BMI 30.70 kg/m   Weight change: @WEIGHTCHANGE @ Height:   Height: 5\' 6"  (167.6 cm) BP 110/62 Ht Readings from Last 3 Encounters:  05/07/20 5\' 6"  (1.676 m)  04/10/20 5\' 6"  (1.676 m)  02/07/20 5\' 6"  (1.676 m)    General appearance: alert, cooperative and appears stated age Head: Normocephalic, without obvious abnormality, atraumatic Neck: no adenopathy, supple, symmetrical, trachea midline and thyroid normal to inspection and palpation Breasts: normal appearance, no masses or tenderness, right nipple is mildly inverted, she thinks it has been like that for a while. Abdomen: soft, non-tender; non distended,  no masses,  no  organomegaly Extremities: extremities normal, atraumatic, no cyanosis or edema Skin: Skin color, texture, turgor normal. No rashes or lesions Lymph nodes: Cervical, supraclavicular, and axillary nodes normal. No abnormal inguinal nodes palpated Neurologic: Grossly  normal   Pelvic: External genitalia:  no lesions              Urethra:  normal appearing urethra with no masses, tenderness or lesions              Bartholins and Skenes: normal                 Vagina: atrophic appearing vagina with normal color and discharge, no lesions              Cervix: no lesions               Bimanual Exam:  Uterus:  no masses or tenderness              Adnexa: no masses, mildly tender on the left               Rectovaginal: Confirms               Anus:  normal sphincter tone, no lesions  Pelvic floor: tender on the left.   1. Encounter for gynecological examination without abnormal finding Mammogram next month Colonoscopy next year Desires pap today  2. Vaginal pain Suspect pelvic floor spasm - Urinalysis,Complete w/RFL Culture. UA with trace leuk  3. History of herpes genitalis Occasional outbreaks. - valACYclovir (VALTREX) 500 MG tablet; TAKE 1 TABLET EVERY DAY INCREASE TO 1 TABLET TWICE DAILY FOR 3 DAYS AS NEEDED  Dispense: 90 tablet; Refill: 1  4. Family history of breast cancer Declined referral to genetics  5. Screening for cervical cancer - Cytology - PAP with reflex hpv  6. Pelvic floor dysfunction Suspect the cause of her vaginal pain If she has recurrent pain will refer to PT   In addition to the breast and pelvic exam over 20 minutes was spent in total patient care.

## 2020-05-04 ENCOUNTER — Other Ambulatory Visit: Payer: Self-pay | Admitting: Obstetrics and Gynecology

## 2020-05-04 DIAGNOSIS — Z1231 Encounter for screening mammogram for malignant neoplasm of breast: Secondary | ICD-10-CM

## 2020-05-07 ENCOUNTER — Other Ambulatory Visit (HOSPITAL_COMMUNITY)
Admission: RE | Admit: 2020-05-07 | Discharge: 2020-05-07 | Disposition: A | Discharge status: Home / Self Care | Payer: Medicare HMO | Source: Ambulatory Visit | Attending: Obstetrics and Gynecology | Admitting: Obstetrics and Gynecology

## 2020-05-07 ENCOUNTER — Other Ambulatory Visit: Payer: Self-pay

## 2020-05-07 ENCOUNTER — Encounter: Payer: Self-pay | Admitting: Obstetrics and Gynecology

## 2020-05-07 ENCOUNTER — Ambulatory Visit (INDEPENDENT_AMBULATORY_CARE_PROVIDER_SITE_OTHER): Payer: Medicare HMO | Admitting: Obstetrics and Gynecology

## 2020-05-07 VITALS — BP 110/62 | HR 89 | Ht 66.0 in | Wt 190.2 lb

## 2020-05-07 DIAGNOSIS — M6289 Other specified disorders of muscle: Secondary | ICD-10-CM

## 2020-05-07 DIAGNOSIS — Z803 Family history of malignant neoplasm of breast: Secondary | ICD-10-CM

## 2020-05-07 DIAGNOSIS — Z124 Encounter for screening for malignant neoplasm of cervix: Secondary | ICD-10-CM | POA: Diagnosis present

## 2020-05-07 DIAGNOSIS — Z8619 Personal history of other infectious and parasitic diseases: Secondary | ICD-10-CM

## 2020-05-07 DIAGNOSIS — Z01419 Encounter for gynecological examination (general) (routine) without abnormal findings: Secondary | ICD-10-CM

## 2020-05-07 DIAGNOSIS — R102 Pelvic and perineal pain: Secondary | ICD-10-CM | POA: Diagnosis not present

## 2020-05-07 DIAGNOSIS — R142 Eructation: Secondary | ICD-10-CM | POA: Insufficient documentation

## 2020-05-07 DIAGNOSIS — R194 Change in bowel habit: Secondary | ICD-10-CM | POA: Insufficient documentation

## 2020-05-07 DIAGNOSIS — R143 Flatulence: Secondary | ICD-10-CM | POA: Insufficient documentation

## 2020-05-07 DIAGNOSIS — R141 Gas pain: Secondary | ICD-10-CM | POA: Insufficient documentation

## 2020-05-07 MED ORDER — VALACYCLOVIR HCL 500 MG PO TABS: ORAL_TABLET | ORAL | 1 refills | Status: DC

## 2020-05-07 NOTE — Patient Instructions (Signed)

## 2020-05-08 ENCOUNTER — Encounter: Payer: Self-pay | Admitting: Obstetrics and Gynecology

## 2020-05-08 LAB — CYTOLOGY - PAP: Diagnosis: NEGATIVE

## 2020-05-09 ENCOUNTER — Encounter: Payer: Self-pay | Admitting: Obstetrics and Gynecology

## 2020-05-09 LAB — URINALYSIS, COMPLETE W/RFL CULTURE
Bacteria, UA: NONE SEEN /HPF
Bilirubin Urine: NEGATIVE
Glucose, UA: NEGATIVE
Hgb urine dipstick: NEGATIVE
Hyaline Cast: NONE SEEN /LPF
Ketones, ur: NEGATIVE
Nitrites, Initial: NEGATIVE
Protein, ur: NEGATIVE
RBC / HPF: NONE SEEN /HPF (ref 0–2)
Specific Gravity, Urine: 1.02 (ref 1.001–1.03)
pH: 7 (ref 5.0–8.0)

## 2020-05-09 LAB — URINE CULTURE
MICRO NUMBER:: 11643497
Result:: NO GROWTH
SPECIMEN QUALITY:: ADEQUATE

## 2020-05-09 LAB — CULTURE INDICATED

## 2020-05-18 ENCOUNTER — Other Ambulatory Visit: Payer: Self-pay | Admitting: Sports Medicine

## 2020-05-24 ENCOUNTER — Other Ambulatory Visit: Payer: Self-pay | Admitting: Sports Medicine

## 2020-05-24 MED ORDER — TRAMADOL HCL 50 MG PO TABS: 50.0000 mg | ORAL_TABLET | Freq: Three times a day (TID) | ORAL | 4 refills | Status: DC

## 2020-06-07 ENCOUNTER — Ambulatory Visit: Payer: Medicare HMO | Admitting: Sports Medicine

## 2020-06-19 ENCOUNTER — Ambulatory Visit (INDEPENDENT_AMBULATORY_CARE_PROVIDER_SITE_OTHER): Payer: Medicare HMO | Admitting: Sports Medicine

## 2020-06-19 ENCOUNTER — Other Ambulatory Visit: Payer: Self-pay

## 2020-06-19 VITALS — BP 111/65 | Ht 66.0 in | Wt 191.0 lb

## 2020-06-19 DIAGNOSIS — M542 Cervicalgia: Secondary | ICD-10-CM | POA: Diagnosis not present

## 2020-06-19 DIAGNOSIS — M5030 Other cervical disc degeneration, unspecified cervical region: Secondary | ICD-10-CM

## 2020-06-19 MED ORDER — TRAMADOL HCL 50 MG PO TABS: 50.0000 mg | ORAL_TABLET | Freq: Three times a day (TID) | ORAL | 4 refills | Status: DC

## 2020-06-19 NOTE — Patient Instructions (Signed)
It was great to see you today.  Here is a quick review of the things we talked about:  Neck pain: I am so glad that your headaches have been improving with exercises.  I recommend you continue doing her exercises daily to help with the degenerative disc disease in your neck.  You can continue taking tramadol and doxepin for this issue.  Back pain: Looks like you are having little bit of back irritation from lying flat in bed.  He was a brief description of the Williams flexion exercises that we discussed: 1) hold your knee as close to your chest as you can for 5 seconds.  Do this for 5 repetitions and rest.  Complete 5 sets of this exercise. 2) hold your knee across your body for 5 seconds.  Do this for 5 repetitions and rest.  Complete 5 sets of this exercise. 3) relaxing as much as possible, leaning forward over your knees to relax and stretch her low back.  Hold this for 30 seconds. 4) while seated, keep your back flat against the chair and try to bring your stomach inward is much as possible, bringing her bellybutton close to your spine.  Hold this for 30 seconds.  Return to clinic in 2 months to see how you are doing.  Return sooner if new problems arise.

## 2020-06-19 NOTE — Progress Notes (Signed)
PCP: Gayland Curry, DO  Subjective:   HPI: Patient is a 79 y.o. female here for neck pain/headaches and low back pain.  Cervical degenerative disc disease She was last seen in clinic about 2 months ago at which time she was encouraged to work through some home exercises for her neck and to begin taking doxepin.  Since her last visit, she has been routinely performing the home exercises, twice daily and been taking tramadol 3 times daily.  She reports dramatic improvement in her headaches.  She has not had any headaches in the past 2 weeks.  She is incredibly grateful for this improvement to her quality of life.  She has not had any arm pain or neck pain.  Low back pain She has begun to notice low back aching which has been coming and going for years but seems to have worsened in the past 2 weeks.  She notices this most when she is lying in bed at night.  She is forced to lie on her back because she must use a CPAP for her OSA.  This is becoming increasingly uncomfortable in her low back.  She also has some discomfort when gardening and performing other manual tasks.  She does not have any shooting pain.  She denies any sharp or moving pain down her legs.  She has no trouble with bowel movements or urinary incontinence.  She has not had any specific trauma that she can recall.  Review of Systems:  Per HPI.   Hand, medications and smoking status reviewed.      Objective:  Physical Exam:  No flowsheet data found.   Gen: awake, alert, NAD, comfortable in exam room Pulm: breathing unlabored  Neck: - Inspection: no gross deformity or asymmetry, swelling or ecchymosis - Palpation: No tenderness to palpation over spinous process, notable tension over the trapezius muscles bilaterally.  No paraspinal muscle tenderness. - ROM: Limited range of motion with axial rotation to the left and to the right.  Full range of motion with flexion.  Limited range of motion with  extension.  Back: -Inspection: No gross deformity or asymmetry.  No skin changes, swelling, bruising. -Palpation: No tenderness with percussion of the vertebrae.  No paraspinal muscle tenderness.  Mild tenderness of the SI joints bilaterally, left greater than right. -ROM: Full ROM with axial rotation to the left into the right.  Able to touch her ankles with forward flexion of the spine without significant discomfort.  Able to demonstrate lumbar extension without discomfort. -Neuro: Negative slump test bilaterally -Strength: 5/5 strength for hip flexion, knee flexion, knee extension    Assessment & Plan:  1.  Cervical DDD Remarkable improvement in her headaches since her last visit.  She is incredibly grateful.  No additional intervention needed at this time. -Continue home exercises -Continue tramadol  2.  Low back pain Likely related to increased lordosis of the lumbar spine at night as she has to lie on her back.  Low suspicion for discogenic pain or facet arthropathy.  Low suspicion for traumatic injury or bony abnormality.  For now, we will move forward with Williams flexion series for the next 2 months to see if this is helpful for her low back pain.  If this does not improve her symptoms, it may be helpful to have her invest in wedges for her bed to augment her sleeping position.   Matilde Haymaker, MD PGY-3 06/19/2020 10:14 AM  I observed and examined the patient with the resident  and agree with assessment and plan.  Note reviewed and modified by me. Ila Mcgill, MD

## 2020-06-26 ENCOUNTER — Other Ambulatory Visit: Payer: Self-pay

## 2020-06-26 ENCOUNTER — Ambulatory Visit
Admission: RE | Admit: 2020-06-26 | Discharge: 2020-06-26 | Disposition: A | Discharge status: Home / Self Care | Payer: Medicare HMO | Source: Ambulatory Visit | Attending: Obstetrics and Gynecology | Admitting: Obstetrics and Gynecology

## 2020-06-26 DIAGNOSIS — Z1231 Encounter for screening mammogram for malignant neoplasm of breast: Secondary | ICD-10-CM

## 2020-06-27 ENCOUNTER — Encounter: Payer: Self-pay | Admitting: Obstetrics and Gynecology

## 2020-07-17 ENCOUNTER — Ambulatory Visit: Payer: Medicare HMO | Admitting: Neurology

## 2020-08-03 ENCOUNTER — Other Ambulatory Visit: Payer: Self-pay | Admitting: Obstetrics and Gynecology

## 2020-08-03 DIAGNOSIS — Z8619 Personal history of other infectious and parasitic diseases: Secondary | ICD-10-CM

## 2020-08-03 NOTE — Telephone Encounter (Signed)
Annual exam was on 05/07/20

## 2020-08-21 ENCOUNTER — Ambulatory Visit: Payer: Medicare HMO | Admitting: Sports Medicine

## 2020-08-22 ENCOUNTER — Ambulatory Visit (INDEPENDENT_AMBULATORY_CARE_PROVIDER_SITE_OTHER): Payer: Medicare HMO | Admitting: Family Medicine

## 2020-08-22 ENCOUNTER — Other Ambulatory Visit: Payer: Self-pay

## 2020-08-22 ENCOUNTER — Encounter (INDEPENDENT_AMBULATORY_CARE_PROVIDER_SITE_OTHER): Payer: Self-pay | Admitting: Family Medicine

## 2020-08-22 VITALS — BP 128/73 | HR 66 | Temp 97.9°F | Ht 66.0 in | Wt 188.0 lb

## 2020-08-22 DIAGNOSIS — G4733 Obstructive sleep apnea (adult) (pediatric): Secondary | ICD-10-CM

## 2020-08-22 DIAGNOSIS — E038 Other specified hypothyroidism: Secondary | ICD-10-CM

## 2020-08-22 DIAGNOSIS — R5383 Other fatigue: Secondary | ICD-10-CM | POA: Diagnosis not present

## 2020-08-22 DIAGNOSIS — E559 Vitamin D deficiency, unspecified: Secondary | ICD-10-CM

## 2020-08-22 DIAGNOSIS — E669 Obesity, unspecified: Secondary | ICD-10-CM | POA: Diagnosis not present

## 2020-08-22 DIAGNOSIS — R739 Hyperglycemia, unspecified: Secondary | ICD-10-CM

## 2020-08-22 DIAGNOSIS — Z0289 Encounter for other administrative examinations: Secondary | ICD-10-CM

## 2020-08-22 DIAGNOSIS — Z1331 Encounter for screening for depression: Secondary | ICD-10-CM

## 2020-08-22 DIAGNOSIS — Z9989 Dependence on other enabling machines and devices: Secondary | ICD-10-CM | POA: Diagnosis not present

## 2020-08-22 DIAGNOSIS — Z6830 Body mass index (BMI) 30.0-30.9, adult: Secondary | ICD-10-CM | POA: Diagnosis not present

## 2020-08-23 LAB — VITAMIN D 25 HYDROXY (VIT D DEFICIENCY, FRACTURES): Vit D, 25-Hydroxy: 30.4 ng/mL (ref 30.0–100.0)

## 2020-08-23 LAB — HEMOGLOBIN A1C
Est. average glucose Bld gHb Est-mCnc: 120 mg/dL
Hgb A1c MFr Bld: 5.8 % — ABNORMAL HIGH (ref 4.8–5.6)

## 2020-08-23 LAB — COMPREHENSIVE METABOLIC PANEL
ALT: 21 IU/L (ref 0–32)
AST: 19 IU/L (ref 0–40)
Albumin/Globulin Ratio: 1.8 (ref 1.2–2.2)
Albumin: 4.8 g/dL — ABNORMAL HIGH (ref 3.7–4.7)
Alkaline Phosphatase: 91 IU/L (ref 44–121)
BUN/Creatinine Ratio: 20 (ref 12–28)
BUN: 11 mg/dL (ref 8–27)
Bilirubin Total: 0.4 mg/dL (ref 0.0–1.2)
CO2: 24 mmol/L (ref 20–29)
Calcium: 9.9 mg/dL (ref 8.7–10.3)
Chloride: 101 mmol/L (ref 96–106)
Creatinine, Ser: 0.55 mg/dL — ABNORMAL LOW (ref 0.57–1.00)
Globulin, Total: 2.6 g/dL (ref 1.5–4.5)
Glucose: 84 mg/dL (ref 65–99)
Potassium: 4.7 mmol/L (ref 3.5–5.2)
Sodium: 142 mmol/L (ref 134–144)
Total Protein: 7.4 g/dL (ref 6.0–8.5)
eGFR: 93 mL/min/{1.73_m2} (ref >59)

## 2020-08-23 LAB — CBC WITH DIFFERENTIAL
Basophils Absolute: 0 10*3/uL (ref 0.0–0.2)
Basos: 1 %
EOS (ABSOLUTE): 0.2 10*3/uL (ref 0.0–0.4)
Eos: 3 %
Hematocrit: 45.4 % (ref 34.0–46.6)
Hemoglobin: 14.9 g/dL (ref 11.1–15.9)
Immature Grans (Abs): 0 10*3/uL (ref 0.0–0.1)
Immature Granulocytes: 0 %
Lymphocytes Absolute: 1.2 10*3/uL (ref 0.7–3.1)
Lymphs: 18 %
MCH: 28.7 pg (ref 26.6–33.0)
MCHC: 32.8 g/dL (ref 31.5–35.7)
MCV: 87 fL (ref 79–97)
Monocytes Absolute: 0.3 10*3/uL (ref 0.1–0.9)
Monocytes: 5 %
Neutrophils Absolute: 4.7 10*3/uL (ref 1.4–7.0)
Neutrophils: 73 %
RBC: 5.2 x10E6/uL (ref 3.77–5.28)
RDW: 13 % (ref 11.7–15.4)
WBC: 6.5 10*3/uL (ref 3.4–10.8)

## 2020-08-23 LAB — INSULIN, RANDOM: INSULIN: 6.9 u[IU]/mL (ref 2.6–24.9)

## 2020-08-23 LAB — TSH: TSH: <0.005 u[IU]/mL — ABNORMAL LOW (ref 0.450–4.500)

## 2020-08-23 LAB — T3: T3, Total: 121 ng/dL (ref 71–180)

## 2020-08-23 LAB — T4, FREE: Free T4: 1.94 ng/dL — ABNORMAL HIGH (ref 0.82–1.77)

## 2020-08-30 ENCOUNTER — Other Ambulatory Visit: Payer: Self-pay

## 2020-08-30 ENCOUNTER — Ambulatory Visit (INDEPENDENT_AMBULATORY_CARE_PROVIDER_SITE_OTHER): Payer: Medicare HMO | Admitting: Sports Medicine

## 2020-08-30 VITALS — BP 109/63

## 2020-08-30 DIAGNOSIS — M79641 Pain in right hand: Secondary | ICD-10-CM

## 2020-08-30 DIAGNOSIS — M5030 Other cervical disc degeneration, unspecified cervical region: Secondary | ICD-10-CM | POA: Diagnosis not present

## 2020-08-30 DIAGNOSIS — M542 Cervicalgia: Secondary | ICD-10-CM

## 2020-08-30 DIAGNOSIS — M545 Low back pain, unspecified: Secondary | ICD-10-CM | POA: Insufficient documentation

## 2020-08-30 MED ORDER — DOXEPIN HCL 50 MG PO CAPS: ORAL_CAPSULE | ORAL | 4 refills | Status: DC

## 2020-08-30 MED ORDER — TRAMADOL HCL 50 MG PO TABS: 50.0000 mg | ORAL_TABLET | Freq: Three times a day (TID) | ORAL | 4 refills | Status: DC

## 2020-08-30 MED ORDER — DOXEPIN HCL 100 MG PO CAPS: 100.0000 mg | ORAL_CAPSULE | Freq: Every day | ORAL | 1 refills | Status: DC

## 2020-08-30 NOTE — Assessment & Plan Note (Signed)
Improving with Williams flexion exercises, advised to continue this.  Also continue to take tramadol per above.  Follow-up in 3 months or sooner as needed.

## 2020-08-30 NOTE — Assessment & Plan Note (Signed)
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  We will have her use Voltaren gel topically as needed for her pain 3 times daily to 4 times daily.

## 2020-08-30 NOTE — Assessment & Plan Note (Signed)
Overall she is doing very well and happy with her current quality of life.  Continue tramadol 50 mg 3 times daily, refill provided.  Also continue doxepin 100 mg nightly.  Follow-up in 3 months or sooner if worsening.

## 2020-08-30 NOTE — Progress Notes (Signed)
[name] is a 80 y.o. female who presents to [facility] today for the following:  Follow-up neck pain and headaches History of cervical degenerative disc disease Patient reports that she has had significant improvement and performing her exercises and taking tramadol TID and doxepin She even had a long cross-country flight recently and did not have any headaches or neck pain with this Overall, she is doing very well and very happy with her quality of life currently  Low back pain This was discussed at her last visit on 4/26, at that time it was thought to be related to her lying flat while using her CPAP for OSA She was given Williams flexion exercises to perform which she has been doing without any problem She reports that her pain has significantly improved  Right hand pain She reports that the base of her first metacarpal she has significant pain She is right-handed She states that she had prior de Quervain's surgery about 15 years ago She states it only hurts if she uses her hand for extended period of time She reports that today she is having a significant amount of pain She states that the tramadol that she takes for her other pain does not help this pain She describes the pain as an ache She denies any particular movements causing the pain  PMH reviewed.  Note she has entered a wellness and weight loss program at [facility]  ROS as above. Medications reviewed.  Exam:  BP 109/63   LMP 02/25/1995 (Approximate)  Gen: Well NAD MSK:  Neck: - Inspection: no gross deformity or asymmetry, swelling or ecchymosis - Palpation: no TTP spinous process - ROM: full active ROM of the cervical spine with neck extension, rotation, flexion without pain - Strength: 5/5 strength BUE OK sign, interosseus strength intact b/l - Neuro: sensation intact in the C5-C8 nerve root distribution b/l, 2+ C5-C7 reflexes - Special testing: Negative Spurling's  Lumbar spine: - Inspection: no gross  deformity or asymmetry, swelling or ecchymosis - Palpation: No TTP over the spinous processes, paraspinal muscles, or SI joints b/l - ROM: full active ROM of the lumbar spine in flexion and extension without pain - Strength: 5/5 strength of lower extremity in L4-S1 nerve root distributions b/l; normal gait - Neuro: sensation intact in the L4-S1 nerve root distribution b/l, 2+ L4 and S1 reflexes - Special testing: Negative straight leg raise  Right Hand: Inspection: Bony prominence at region of first [facility] on right, more prominent than left.  No swelling, erythema or bruising Palpation: no TTP b/l ROM: Full ROM of the digits and wrist. Fully able to extend and flex finger. Strength: 5/5 strength in the forearm, wrist and interosseus muscles Neurovascular: NV intact b/l Special tests: Negative finkelstein's, pain with grinding of first [facility] on right      No results found.   Assessment and Plan: 1) Degenerative disc disease, cervical Overall she is doing very well and happy with her current quality of life.  Continue tramadol 50 mg 3 times daily, refill provided.  Also continue doxepin 100 mg nightly.  Follow-up in 3 months or sooner if worsening.  Lumbar spine pain Improving with Williams flexion exercises, advised to continue this.  Also continue to take tramadol per above.  Follow-up in 3 months or sooner as needed.  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  We will have her use Voltaren gel topically as needed for her pain 3 times daily to 4 times daily.  [place] Constable, D.O.  PGY-4 [facility]  08/30/2020 5:38 PM  I observed and examined the patient with the [facility] resident and agree with assessment and plan.  Note reviewed and modified by me. Ila Mcgill, MD

## 2020-08-30 NOTE — Progress Notes (Signed)
Chief Complaint:   [place] (MR# [id number]) is a 80 y.o. female who presents for evaluation and treatment of obesity and related comorbidities. Current BMI is Body mass index is 30.34 kg/m. [name] has been struggling with her weight for many years and has been unsuccessful in either losing weight, maintaining weight loss, or reaching her healthy weight goal.  [name] is currently in the action stage of change and ready to dedicate time achieving and maintaining a healthier weight. [name] is interested in becoming our patient and working on intensive lifestyle modifications including (but not limited to) diet and exercise for weight loss.  [name]'s habits were reviewed today and are as follows: Her family eats meals together, her desired weight loss is 28 lbs, she started gaining weight 195, her heaviest weight ever was 195 pounds, she has significant food cravings issues, she snacks frequently in the evenings, she skips meals frequently, she is frequently drinking liquids with calories, she frequently makes poor food choices, she frequently eats larger portions than normal, and she struggles with emotional eating.  Depression Screen [name]'s Food and Mood (modified PHQ-9) score was 15.  Depression screen [facility] 2/9 08/22/2020  Decreased Interest 3  Down, Depressed, Hopeless 2  PHQ - 2 Score 5  Altered sleeping 0  Tired, decreased energy 3  Change in appetite 2  Feeling bad or failure about yourself  2  Trouble concentrating 3  Moving slowly or fidgety/restless 0  Suicidal thoughts 0  PHQ-9 Score 15  Difficult doing work/chores Not difficult at all  Some recent data might be hidden   Subjective:   1. Other fatigue [name] admits to daytime somnolence and admits to waking up still tired. [place] has a history of symptoms of daytime fatigue and morning headache. [name] generally gets 8 hours of sleep per night, and states that she has generally restful sleep. Snoring is present. Apneic  episodes are present. Epworth Sleepiness Score is 7.  2. OSA on CPAP [name] has a history of obstructive sleep apnea. She is working on weight loss to improve.  3. Vitamin D deficiency [name] has a history of Vit D deficiency, and she notes fatigue.  4. Other specified hypothyroidism [name] is on high dose of levothyroxine. She still notes some chronic fatigue.  5. Hyperglycemia [name]'s blood sugars are mostly at goal. She has no recent A1c or fasting insulin. She shows no signs of insulin resistance.  Assessment/Plan:   1. Other fatigue [place] does feel that her weight is causing her energy to be lower than it should be. Fatigue may be related to obesity, depression or many other causes. Labs will be ordered, and in the meanwhile, [name] will focus on self care including making healthy food choices, increasing physical activity and focusing on stress reduction.  - CBC With Differential - EKG 12-Lead  2. OSA on CPAP Intensive lifestyle modifications are the first line treatment for this issue. We discussed several lifestyle modifications today. Koh[place] will start her Category 2 plan, and will continue to work on diet, exercise and weight loss efforts. We will continue to monitor. Orders and follow up as documented in patient record.    3. Vitamin D deficiency Low Vitamin D level contributes to fatigue and are associated with obesity, breast, and colon cancer. We will check labs today. [name] will follow-up for routine testing of Vitamin D, at least 2-3 times per year to avoid over-replacement.  - VITAMIN D 25 Hydroxy (Vit-D Deficiency, Fractures)  4. Other specified hypothyroidism We will check labs today. [name] will continue to follow up as directed. Orders and follow up as documented in patient record.  - TSH - T3 - T4, free  5. Hyperglycemia Fasting labs will be obtained and results with be discussed with [name] in 2 weeks at her follow up visit. In the meanwhile [name] will start her Category 2  plan and will work on weight loss efforts.  - Insulin, random - Hemoglobin A1c - Comprehensive metabolic panel  6. Screening for depression [name] had a positive depression screening. Depression is commonly associated with obesity and often results in emotional eating behaviors. We will monitor this closely and work on CBT to help improve the non-hunger eating patterns. Referral to Psychology may be required if no improvement is seen as she continues in our clinic.  7. Obesity with current BMI 30.5 [name] is currently in the action stage of change and her goal is to continue with weight loss efforts. I recommend [name] begin the structured treatment plan as follows:  She has agreed to the Category 2 Plan.  Exercise goals: No exercise has been prescribed for now, while we concentrate on nutritional changes.  Behavioral modification strategies: increasing lean protein intake, increasing water intake, and no skipping meals.  She was informed of the importance of frequent follow-up visits to maximize her success with intensive lifestyle modifications for her multiple health conditions. She was informed we would discuss her lab results at her next visit unless there is a critical issue that needs to be addressed sooner. [name] agreed to keep her next visit at the agreed upon time to discuss these results.  Objective:   Blood pressure 128/73, pulse 66, temperature 97.9 F (36.6 C), height 5\' 6"  (1.676 m), weight 188 lb (85.3 kg), last menstrual period 02/25/1995, SpO2 97 %. Body mass index is 30.34 kg/m.  EKG: Normal sinus rhythm, rate 67 BPM.  Indirect Calorimeter completed today shows a VO2 of 234 and a REE of 1626.  Her calculated basal metabolic rate is 6387 thus her basal metabolic rate is better than expected.  General: Cooperative, alert, well developed, in no acute distress. HEENT: Conjunctivae and lids unremarkable. Cardiovascular: Regular rhythm.  Lungs: Normal work of  breathing. Neurologic: No focal deficits.   Lab Results  Component Value Date   CREATININE 0.55 (L) 08/22/2020   BUN 11 08/22/2020   NA 142 08/22/2020   K 4.7 08/22/2020   CL 101 08/22/2020   CO2 24 08/22/2020   Lab Results  Component Value Date   ALT 21 08/22/2020   AST 19 08/22/2020   ALKPHOS 91 08/22/2020   BILITOT 0.4 08/22/2020   Lab Results  Component Value Date   HGBA1C 5.8 (H) 08/22/2020   HGBA1C 5.5 08/27/2015   Lab Results  Component Value Date   INSULIN 6.9 08/22/2020   Lab Results  Component Value Date   TSH <0.005 (L) 08/22/2020   Lab Results  Component Value Date   CHOL 160 02/11/2017   HDL 41 (L) 02/11/2017   LDLCALC 94 02/11/2017   TRIG 149 02/11/2017   CHOLHDL 3.9 02/11/2017   Lab Results  Component Value Date   WBC 6.5 08/22/2020   HGB 14.9 08/22/2020   HCT 45.4 08/22/2020   MCV 87 08/22/2020   PLT 222 02/11/2017   No results found for: IRON, TIBC, FERRITIN Obesity Behavioral Intervention:   Approximately 15 minutes were spent on the discussion below.  ASK: We discussed the diagnosis of  obesity with [name] today and [name] agreed to give [place] permission to discuss obesity behavioral modification therapy today.  ASSESS: [name] has the diagnosis of obesity and her BMI today is 30.36. [name] is in the action stage of change.   ADVISE: [name] was educated on the multiple health risks of obesity as well as the benefit of weight loss to improve her health. She was advised of the need for long term treatment and the importance of lifestyle modifications to improve her current health and to decrease her risk of future health problems.  AGREE: Multiple dietary modification options and treatment options were discussed and [name] agreed to follow the recommendations documented in the above note.  ARRANGE: [name] was educated on the importance of frequent visits to treat obesity as outlined per CMS and USPSTF guidelines and agreed to schedule her next follow up  appointment today.  Attestation Statements:   Reviewed by clinician on day of visit: allergies, medications, problem list, medical history, surgical history, family history, social history, and previous encounter notes.   I, Trixie Dredge, am acting as transcriptionist for Dennard Nip, MD.  I have reviewed the above documentation for accuracy and completeness, and I agree with the above. - Dennard Nip, MD

## 2020-09-05 ENCOUNTER — Other Ambulatory Visit: Payer: Self-pay

## 2020-09-05 ENCOUNTER — Ambulatory Visit (INDEPENDENT_AMBULATORY_CARE_PROVIDER_SITE_OTHER): Payer: Medicare HMO | Admitting: Family Medicine

## 2020-09-05 ENCOUNTER — Encounter (INDEPENDENT_AMBULATORY_CARE_PROVIDER_SITE_OTHER): Payer: Self-pay | Admitting: Family Medicine

## 2020-09-05 VITALS — BP 114/73 | HR 83 | Temp 97.9°F | Ht 66.0 in | Wt 183.0 lb

## 2020-09-05 DIAGNOSIS — Z6830 Body mass index (BMI) 30.0-30.9, adult: Secondary | ICD-10-CM

## 2020-09-05 DIAGNOSIS — E038 Other specified hypothyroidism: Secondary | ICD-10-CM

## 2020-09-05 DIAGNOSIS — E669 Obesity, unspecified: Secondary | ICD-10-CM

## 2020-09-05 DIAGNOSIS — E559 Vitamin D deficiency, unspecified: Secondary | ICD-10-CM | POA: Diagnosis not present

## 2020-09-05 DIAGNOSIS — R7303 Prediabetes: Secondary | ICD-10-CM

## 2020-09-05 MED ORDER — VITAMIN D (ERGOCALCIFEROL) 1.25 MG (50000 UNIT) PO CAPS: 50000.0000 [IU] | ORAL_CAPSULE | ORAL | 0 refills | Status: DC

## 2020-09-06 ENCOUNTER — Other Ambulatory Visit: Payer: Self-pay

## 2020-09-06 MED ORDER — DOXEPIN HCL 50 MG PO CAPS: ORAL_CAPSULE | ORAL | 1 refills | Status: DC

## 2020-09-10 ENCOUNTER — Encounter (INDEPENDENT_AMBULATORY_CARE_PROVIDER_SITE_OTHER): Payer: Self-pay | Admitting: Family Medicine

## 2020-09-11 NOTE — Telephone Encounter (Signed)
Last OV with Dr. Beasley 

## 2020-09-12 NOTE — Progress Notes (Signed)
Chief Complaint:   OBESITY Sir[place] is here to discuss her progress with her obesity treatment plan along with follow-up of her obesity related diagnoses. Corynne is on the Category 2 Plan and states she is following her eating plan approximately 100% of the time. Poppi states she is doing water aerobic for 45 minutes 5 times per week.  Today's visit was #: 2 Starting weight: 188 lbs Starting date: 08/22/2020 Today's weight: 183 lbs Today's date: 09/05/2020 Total lbs lost to date: 5 Total lbs lost since last in-office visit: 5  Interim History: Sargun has done very well with weight loss on her plan. Her hunger was controlled and she moved some of her food around which helped her follow her plan. She would like to try to add more fruit on her plan.  Subjective:   1. Vitamin D deficiency Latiya is on OTC Vit D 5,000 IU daily, but her level is still not yet at goal. She notes fatigue. I discussed labs with the patient today.  2. Other specified hypothyroidism [place]'s levels are worsening. Her T4 is elevated and her TSH is very low. She denies tremors or palpitations. She is followed by her Endocrinologist. I discussed labs with the patient today.  3. Pre-diabetes Lexys has a new diagnosis of pre-diabetes. Her A1c is elevated at 5.8. She has a family history of diabetes mellitus. She is working on decreasing her disk of diabetes mellitus. I discussed labs with the patient today.  Assessment/Plan:   1. Vitamin D deficiency Low Vitamin D level contributes to fatigue and are associated with obesity, breast, and colon cancer. Sharyl agreed to continue taking OTC Vit D, and she will start prescription Vitamin D 50,000 IU every week with no refills. She will follow-up for routine testing of Vitamin D, at least 2-3 times per year to avoid over-replacement.  - Vitamin D, Ergocalciferol, (DRISDOL) 1.25 MG (50000 UNIT) CAPS capsule; Take 1 capsule (50,000 Units total) by mouth every 7 (seven) days.  Dispense:  4 capsule; Refill: 0  2. Other specified hypothyroidism Riva is to contact her Endocrinologist with her recent labs to discuss if changes needs to be made. Orders and follow up as documented in patient record.  3. Pre-diabetes Destanie was given a pre-diabetes handout today. She will continue with diet, exercise, and weight loss to help decrease the risk of diabetes.   4. Obesity with current BMI 29.5 Eileen is currently in the action stage of change. As such, her goal is to continue with weight loss efforts. She has agreed to the Category 2 Plan.   Smart fruit options.  Exercise goals: As is.  Behavioral modification strategies: increasing lean protein intake and meal planning and cooking strategies.  Meda has agreed to follow-up with our clinic in 2 weeks. She was informed of the importance of frequent follow-up visits to maximize her success with intensive lifestyle modifications for her multiple health conditions.   Objective:   Blood pressure 114/73, pulse 83, temperature 97.9 F (36.6 C), height 5\' 6"  (1.676 m), weight 183 lb (83 kg), last menstrual period 02/25/1995, SpO2 96 %. Body mass index is 29.54 kg/m.  General: Cooperative, alert, well developed, in no acute distress. HEENT: Conjunctivae and lids unremarkable. Cardiovascular: Regular rhythm.  Lungs: Normal work of breathing. Neurologic: No focal deficits.   Lab Results  Component Value Date   CREATININE 0.55 (L) 08/22/2020   BUN 11 08/22/2020   NA 142 08/22/2020   K 4.7 08/22/2020   CL  101 08/22/2020   CO2 24 08/22/2020   Lab Results  Component Value Date   ALT 21 08/22/2020   AST 19 08/22/2020   ALKPHOS 91 08/22/2020   BILITOT 0.4 08/22/2020   Lab Results  Component Value Date   HGBA1C 5.8 (H) 08/22/2020   HGBA1C 5.5 08/27/2015   Lab Results  Component Value Date   INSULIN 6.9 08/22/2020   Lab Results  Component Value Date   TSH <0.005 (L) 08/22/2020   Lab Results  Component Value Date   CHOL  160 02/11/2017   HDL 41 (L) 02/11/2017   LDLCALC 94 02/11/2017   TRIG 149 02/11/2017   CHOLHDL 3.9 02/11/2017   Lab Results  Component Value Date   VD25OH 30.4 08/22/2020   VD25OH 28 (L) 02/11/2017   VD25OH 29.8 05/07/2016   Lab Results  Component Value Date   WBC 6.5 08/22/2020   HGB 14.9 08/22/2020   HCT 45.4 08/22/2020   MCV 87 08/22/2020   PLT 222 02/11/2017   No results found for: IRON, TIBC, FERRITIN  Attestation Statements:   Reviewed by clinician on day of visit: allergies, medications, problem list, medical history, surgical history, family history, social history, and previous encounter notes.  Time spent on visit including pre-visit chart review and post-visit care and charting was 40 minutes.    I, Trixie Dredge, am acting as transcriptionist for Dennard Nip, MD.  I have reviewed the above documentation for accuracy and completeness, and I agree with the above. -  Dennard Nip, MD

## 2020-09-18 ENCOUNTER — Encounter: Payer: Self-pay | Admitting: Neurology

## 2020-09-18 ENCOUNTER — Ambulatory Visit (INDEPENDENT_AMBULATORY_CARE_PROVIDER_SITE_OTHER): Payer: Medicare HMO | Admitting: Neurology

## 2020-09-18 VITALS — BP 111/68 | HR 82 | Ht 66.5 in | Wt 182.0 lb

## 2020-09-18 DIAGNOSIS — M47812 Spondylosis without myelopathy or radiculopathy, cervical region: Secondary | ICD-10-CM | POA: Diagnosis not present

## 2020-09-18 DIAGNOSIS — G4733 Obstructive sleep apnea (adult) (pediatric): Secondary | ICD-10-CM

## 2020-09-18 DIAGNOSIS — F5104 Psychophysiologic insomnia: Secondary | ICD-10-CM

## 2020-09-18 DIAGNOSIS — H43811 Vitreous degeneration, right eye: Secondary | ICD-10-CM | POA: Diagnosis not present

## 2020-09-18 DIAGNOSIS — Z9989 Dependence on other enabling machines and devices: Secondary | ICD-10-CM

## 2020-09-18 NOTE — Patient Instructions (Signed)
Eszopiclone tablets What is this medication? ESZOPICLONE (es ZOE pi clone) is used to treat insomnia. This medicine helpsyou to fall asleep and sleep through the night. This medicine may be used for other purposes; ask your health care provider orpharmacist if you have questions. COMMON BRAND NAME(S): Lunesta What should I tell my care team before I take this medication? They need to know if you have any of these conditions: depression history of a drug or alcohol abuse problem liver disease lung or breathing disease sleep-walking, driving, eating or other activity while not fully awake after taking a sleep medicine suicidal thoughts an unusual or allergic reaction to eszopiclone, other medicines, foods, dyes, or preservatives pregnant or trying to get pregnant breast-feeding How should I use this medication? Take this medicine by mouth with a glass of water. Follow the directions on the prescription label. It is better to take this medicine on an empty stomach and only when you are ready for bed. Do not take your medicine more often than directed. If you have been taking this medicine for several weeks and suddenly stop taking it, you may get unpleasant withdrawal symptoms. Your doctor or health care professional may want to gradually reduce the dose. Do not stop taking this medicine on your own. Always follow your doctor or health careprofessional's advice. Talk to your pediatrician regarding the use of this medicine in children.Special care may be needed. Overdosage: If you think you have taken too much of this medicine contact apoison control center or emergency room at once. NOTE: This medicine is only for you. Do not share this medicine with others. What if I miss a dose? This does not apply. This medicine should only be taken immediately beforegoing to sleep. Do not take double or extra doses. What may interact with this medication? herbal medicines like kava kava, melatonin, St.  John's wort and valerian lorazepam medicines for fungal infections like ketoconazole, fluconazole, or itraconazole olanzapine This list may not describe all possible interactions. Give your health care provider a list of all the medicines, herbs, non-prescription drugs, or dietary supplements you use. Also tell them if you smoke, drink alcohol, or use illegaldrugs. Some items may interact with your medicine. What should I watch for while using this medication? Visit your doctor or health care professional for regular checks on your progress. Keep a regular sleep schedule by going to bed at about the same time nightly. Avoid caffeine-containing drinks in the evening hours, as caffeine can cause trouble with falling asleep. Talk to your doctor if you still havetrouble sleeping. After taking this medicine, you may get up out of bed and do an activity that you do not know you are doing. The next morning, you may have no memory of this. Activities include driving a car ("sleep-driving"), making and eating food, talking on the phone, sexual activity, and sleep-walking. Serious injuries have occurred. Stop the medicine and call your doctor right away if you find out you have done any of these activities. Do not take this medicine if you have used alcohol that evening. Do not take it if you have taken anothermedicine for sleep. The risk of doing these sleep-related activities is higher. Do not take this medicine unless you are able to stay in bed for a full night (7 to 8 hours) before you must be active again. You may have a decrease in mental alertness the day after use, even if you feel that you are fully awake. Tell your doctor if you will  need to perform activities requiring full alertness, such as driving, the next day. Do not stand or sit up quickly after taking this medicine, especially if you are an older patient. This reduces therisk of dizzy or fainting spells. If you or your family notice any changes in  your behavior, such as new or worsening depression, thoughts of harming yourself, anxiety, other unusual ordisturbing thoughts, or memory loss, call your doctor right away. After you stop taking this medicine, you may have trouble falling asleep. This is called rebound insomnia. This problem usually goes away on its own after 1or 2 nights. What side effects may I notice from receiving this medication? Side effects that you should report to your doctor or health care professionalas soon as possible: allergic reactions like skin rash, itching or hives, swelling of the face, lips, or tongue changes in vision confusion depressed mood feeling faint or lightheaded, falls hallucinations problems with balance, speaking, walking restlessness, excitability, or feelings of agitation unusual activities while not fully awake like driving, eating, making phone calls Side effects that usually do not require medical attention (report to yourdoctor or health care professional if they continue or are bothersome): dizziness, or daytime drowsiness, sometimes called a hangover effect headache This list may not describe all possible side effects. Call your doctor for medical advice about side effects. You may report side effects to FDA at1-800-FDA-1088. Where should I keep my medication? Keep out of the reach of children. This medicine can be abused. Keep your medicine in a safe place to protect it from theft. Do not share this medicine with anyone. Selling or giving away this medicine is dangerous and against thelaw. This medicine may cause accidental overdose and death if taken by other adults, children, or pets. Mix any unused medicine with a substance like cat litter or coffee grounds. Then throw the medicine away in a sealed container like a sealed bag or a coffee can with a lid. Do not use the medicine after theexpiration date. Store at room temperature between 15 and 30 degrees C (59 and 86 degrees F). NOTE:  This sheet is a summary. It may not cover all possible information. If you have questions about this medicine, talk to your doctor, pharmacist, orhealth care provider.  2022 Elsevier/Gold Standard (2017-08-07 11:57:05)

## 2020-09-18 NOTE — Progress Notes (Signed)
SLEEP MEDICINE CLINIC   Provider:  Larey Seat, M D  Primary Care Physician:  Reynold Bowen, MD   Referring Provider: No ref. provider found   Chief Complaint  Patient presents with   Follow-up    pt alone, rm 11. pt states that machine is doing well. she uses it nightly. DME [facility]. she had covid in Jan and since then has struggled with headaches that have caused light sensitivity. she is working with another MD trying to get them under control. med changes were updated.    09-18-2020, RV for  [name] is a 80 y.o. female , seen here for headaches, migraines,  that she relates to post-Covid. She had a COVID infection in January 2021 and has had headaches since.   The headaches are associated with photophobia and nausea.  She has been told her cervical disc degeneration is at cause.  She did not respond to trigger point injection. She felt more sore- bee-sting- like Lost her sister in law to [place] , just this month.  She has been on CPAP for UARS, OSA . She is doing better on Doxepin and Toradol and her neck hurt less.  Uses Lunesta - per Dr Casimiro Needle. Dr.Beasley did blood work ay [employer] and wellness.  CPAP compliance: [name] has used her machine 93% of days 28 out of 30 days with an average use at time of 8 hours and 30 minutes.  She is using an AutoSet between 5 and 11 cmH2O was 2 cm expiratory pressure relief.  Residual AHI is 3.0, she has few central apneas emerging.  Her CPAP is now 87.80 years old she also has a travel CPAP which recently broke I will write today for a new travel CPAP prescription for the CPAP to be set to 10 cmH2O was 2 cm EPR the mask that she is using for her regular CPAP should be duplicated there.   The Epworth sleepiness score was endorsed at 2 points out of 24 which is excellent fatigue severity was not endorsed today and geriatric depression score was 6 out of 15 points and she is followed by Dr. Casimiro Needle for depression.   06-15-2019:  She was  told by Andreas Blower, MD, to treat these aggressively - Doxepin at 100 mg at night- but she still has headaches.  When I encountered [name] last in October 2020 she had actually been headache free for a while after starting on doxepin at a lower dose.  She feels well exhausted, feels a complete lack of energy.  Outdoor activities just walking outdoors also are aggravating her allergies.  She has been a compliant CPAP user as usual with over 90% compliance and average use at time of 8 hours and 40 minutes with a minimum pressure of 5, a maximum pressure setting of 11 cmH2O and 2 cm EPR and a residual AHI of only 1.2.  Her air leaks are low and her pressure at the 91st percentile is 9.5.  There needs to be no adjustment done for the CPAP part.  Her fatigue has exacerbated she endorsed a fatigue score of 41, but she is not excessively daytime sleepy.  She reports loss of taste and smell.  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  She endorsed depression of 4 out of 15 - so not a clinical level of concern.    Interval 11/2018 [name] reports improvement in headaches after Dr. Oneida Alar has started her on doxepin. She sleeps well, her  headaches are gone. CPAP : She went to [place] and used machine every single night.  EPWORTH 1. [name] has used her machine daily for the last 30 days including 08 December 2018, average use at time 8 hours 58 minutes, her minimum pressure setting is for maximum pressure setting 10 cmH2O this 2 cm expiratory pressure relief and a resulting AHI of 2.5 there is no air leak.  The resulting AHI is mostly related to obstructive apneas.  The 95th percentile pressure is at 9.2 cmH2O, which makes me think that we need to increase her maximum pressure setting x1 cm the new window will be for up to 11 cmH2O is 2 cm EPR.    08-11-2018:[name] is a 80 y.o. female , seen here for a new concern about a recent fall- retropusive, hit her head on a paved brick pavement, all this  happened on June 6th at her husband's high school reunion in [place], [place].  Patient reports that she initially felt not as much pain, she has spit up by herself went to her bedroom and rested, but after 2 or 3 days she developed a headache associated with some sickness to her stomach and some photophobia but must be migrainous in origin.  She did not break any bones, her pupils are of similar size, and she does not have a focal weakness or sensory abnormality. She hasn't had a migraine for almost one year.  She is not dizzy- but walks with sunglasses.  She needs some intervention  for her migraine and a CT of the had to rule out any bleed.   04-15-2017, I have the pleasure of meeting with [name] today, for a regular CPAP CPAP compliance visit.  The patient now uses an AutoSet between 4 and 10 cmH2O pressure was 1 cm EPR, she has achieved a compliance of 100% with an average use at time of 9 hours and 10 minutes each night.  Her residual AHI is 1.5/h, which is a very good resolution.  95th percentile pressure is 9.7 cm water air leaks are medium. Epworth 2, FSS 41.  She loves the new CPAP auto- machine.  Here is the result of her sleep study:   NAME:  [name]                                                      DOB: [birth year] MEDICAL RECORD NUMBER [id number]                                 DOS: 01/31/2017 REFERRING PHYSICIAN: Hollace Kinnier, D.O. BMI: 30.6  STUDY RESULTS: Total Recording Time: 10 hours, 51 minutes,  Total Apnea/Hypopnea Index (AHI):   8.2/ hr. RDI 10.5 /hr.  Average Oxygen Saturation:    92 % Lowest Oxygen Desaturation:  82 %; Total Time Oxygen Saturation below 89%:  19 minutes Average Heart Rate:   64 bpm (40-161 bpm).     Social History   Socioeconomic History   Marital status: Married    Spouse name: [name]   Number of children: 0   Years of education: 14   Highest education level: Not on file  Occupational History   Occupation: Retired Catering manager   Tobacco Use   Smoking status: Never  Passive exposure: Yes   Smokeless tobacco: Never   Tobacco comments:    flight attendant on smoking plane   Vaping Use   Vaping Use: Never used  Substance and Sexual Activity   Alcohol use: Yes    Alcohol/week: 0.0 standard drinks    Comment: 1 glass - occas of vodka   Drug use: No   Sexual activity: Not Currently    Partners: Male    Birth control/protection: Post-menopausal  Other Topics Concern   Not on file  Social History Narrative   Patient is married [name]).   Patient drinks 2-4 cups of coffee daily.   Patient is retired.   Patient has two years of college.   Patient is right-handed.               Social Determinants of Health   Financial Resource Strain: Not on file  Food Insecurity: Not on file  Transportation Needs: Not on file  Physical Activity: Not on file  Stress: Not on file  Social Connections: Not on file  Intimate Partner Violence: Not on file     Vitals: BP 111/68   Pulse 82   Ht 5' 6.5" (1.689 m)   Wt 182 lb (82.6 kg)   LMP 02/25/1995 (Approximate)   BMI 28.94 kg/m  Last Weight:  Wt Readings from Last 1 Encounters:  09/18/20 182 lb (82.6 kg)   [id number] mass index is 28.94 kg/m.     Last Height:   Ht Readings from Last 1 Encounters:  09/18/20 5' 6.5" (1.689 m)    Physical exam: The patient feels hot to the touch and is flushed. There is a tremor in both hands.   General: The patient is awake, alert and appears not in acute distress. The patient is well groomed. Head: Normocephalic, atraumatic. Neck is supple. Mallampati 4  neck circumference:15.5 . Nasal airflow patent.  Cardiovascular:  Regular rate and rhythm , without  murmurs or carotid bruit, and without distended neck veins. Respiratory: Lungs are clear to auscultation. Skin:  Without evidence of edema, or rash Trunk: BMI is  30.18- .   Neurologic exam : The patient is awake and alert, oriented to place and time.   MOCA:No  flowsheet data found. MMSE: MMSE - Mini Mental State Exam 10/28/2017 09/23/2016 08/31/2015  Orientation to time '5 5 4  '$ Orientation to Place '5 5 5  '$ Registration '3 3 3  '$ Attention/ Calculation '5 5 4  '$ Recall '2 2 3  '$ Language- name 2 objects '2 2 2  '$ Language- repeat '1 1 1  '$ Language- follow 3 step command '3 3 1  '$ Language- read & follow direction '1 1 1  '$ Write a sentence '1 1 1  '$ Copy design '1 1 1  '$ Total score '29 29 26      '$ Attention span & concentration ability appears normal.  Speech is fluent, Mood and affect are appropriate.  Cranial nerves: Pupils are equal and briskly reactive to light. Funduscopic exam without evidence of pallor or edema. Extraocular movements  in vertical and horizontal planes intact and without nystagmus. Visual fields by finger perimetry are intact. Hearing to finger rub intact. Facial sensation intact to fine touch. Facial motor strength is symmetric and tongue and uvula move midline. Shoulder shrug was asymmetrical.  Neck and shoulders are stiff.- extremely tense.   Motor exam:  Normal tone, muscle bulk and symmetric strength in all extremities. Sensory:  Fine touch, pinprick and vibration were tested in all extremities. Proprioception tested in the upper  extremities was normal. Coordination: Rapid alternating movements in the fingers/hands was normal. Finger-to-nose maneuver  normal without evidence of ataxia, dysmetria or tremor. Gait and station: Patient walks without assistive device . Deep tendon reflexes: in the  upper and lower extremities are symmetric and intact. I avoided the patella.  Babinski maneuver response is downgoing.   Assessment:  After physical and neurologic examination, review of laboratory studies,  Personal review of imaging studies, reports of other /same  Imaging studies, results of polysomnography and / or neurophysiology testing and pre-existing records as far as provided in visit;    IMPRESSION:   1) Severe neck tension and known DDD,  restricted ROM for retroflexion/ flexion at the neck .  2) Mild sleep apnea with snoring, desaturation and heart rate variability =baseline  AHI 8.2/hr .UARS was evident.  Continued CPAP therapy is indicated. She will need a new travel CPAP. Set to 10 cm water 2 cm EPR.  I ordered  Auto CPAP 4-10 cm water, 3 cm EPR, heated humidity and fit for dream wear interface.    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   I spent more than 28 minutes of face to face time with the patient. Greater than 50% of time was spent in counseling and coordination of care. We have discussed the diagnosis and differential and I answered the patient's questions.    Plan:  Treatment plan and additional workup : continue using CPAP with the current settings, RV in 6 month.  Marland Kitchen    Larey Seat, MD   [id number], [id number] AM  Certified in Neurology by ABPN Certified in [place] by [facility] [address] [place], [place] [address]

## 2020-09-19 ENCOUNTER — Encounter (INDEPENDENT_AMBULATORY_CARE_PROVIDER_SITE_OTHER): Payer: Self-pay | Admitting: Bariatrics

## 2020-09-19 ENCOUNTER — Other Ambulatory Visit: Payer: Self-pay

## 2020-09-19 ENCOUNTER — Ambulatory Visit (INDEPENDENT_AMBULATORY_CARE_PROVIDER_SITE_OTHER): Payer: Medicare HMO | Admitting: Bariatrics

## 2020-09-19 VITALS — BP 121/74 | HR 75 | Temp 97.7°F | Ht 66.0 in | Wt 178.0 lb

## 2020-09-19 DIAGNOSIS — R7303 Prediabetes: Secondary | ICD-10-CM | POA: Diagnosis not present

## 2020-09-19 DIAGNOSIS — E669 Obesity, unspecified: Secondary | ICD-10-CM

## 2020-09-19 DIAGNOSIS — G4733 Obstructive sleep apnea (adult) (pediatric): Secondary | ICD-10-CM

## 2020-09-19 DIAGNOSIS — Z6830 Body mass index (BMI) 30.0-30.9, adult: Secondary | ICD-10-CM | POA: Diagnosis not present

## 2020-09-19 DIAGNOSIS — Z9989 Dependence on other enabling machines and devices: Secondary | ICD-10-CM

## 2020-09-24 NOTE — Progress Notes (Signed)
Chief Complaint:   OBESITY [name] is here to discuss her progress with her obesity treatment plan along with follow-up of her obesity related diagnoses. [name] is on the Category 2 Plan and states she is following her eating plan approximately 100% of the time. [name] states she is doing water aerobics for 60 minutes 5 times per week.  Today's visit was #: 3 Starting weight: 188 lbs Starting date: 08/22/2020 Today's weight: 178 lbs Today's date: 09/19/2020 Total lbs lost to date: 10 lbs Total lbs lost since last in-office visit: 5 lbs  Interim History: [name] is down another 5 lbs. She is getting in more protein. She is doing well with her water.  Subjective:   1. Pre-diabetes [name] is not on medications.  2. OSA on CPAP [name] has a diagnosis of sleep apnea. She reports that she is using a CPAP regularly.    Assessment/Plan:   1. Pre-diabetes  [name] will decrease CHO and increase healthy fats and protein.  2. OSA on CPAP Intensive lifestyle modifications are the first line treatment for this issue. We discussed several lifestyle modifications today and she will continue to work on diet, exercise and weight loss efforts. We will continue to monitor. Orders and follow up as documented in patient record.    3. Obesity with current BMI 28.7 [name] is currently in the action stage of change. As such, her goal is to continue with weight loss efforts. She has agreed to the Category 2 Plan.   [name] will continue to meal plan. She will continue intentional eating. She will continue with Smart. Fruit Sheet, and Protein Sheet.  Exercise goals:  Water aerobic and will continue.  Behavioral modification strategies: increasing lean protein intake, decreasing simple carbohydrates, increasing vegetables, increasing water intake, decreasing eating out, no skipping meals, meal planning and cooking strategies, keeping healthy foods in the home, and planning for success.  [name] has agreed to follow-up  with our clinic in 2 weeks with Dr. Leafy Ro or Mina Marble, NP. She was informed of the importance of frequent follow-up visits to maximize her success with intensive lifestyle modifications for her multiple health conditions.   Objective:   Blood pressure 121/74, pulse 75, temperature 97.7 F (36.5 C), height '5\' 6"'$  (1.676 m), weight 178 lb (80.7 kg), last menstrual period 02/25/1995, SpO2 94 %. Body mass index is 28.73 kg/m.  General: Cooperative, alert, well developed, in no acute distress. HEENT: Conjunctivae and lids unremarkable. Cardiovascular: Regular rhythm.  Lungs: Normal work of breathing. Neurologic: No focal deficits.   Lab Results  Component Value Date   CREATININE 0.55 (L) 08/22/2020   BUN 11 08/22/2020   NA 142 08/22/2020   K 4.7 08/22/2020   CL 101 08/22/2020   CO2 24 08/22/2020   Lab Results  Component Value Date   ALT 21 08/22/2020   AST 19 08/22/2020   ALKPHOS 91 08/22/2020   BILITOT 0.4 08/22/2020   Lab Results  Component Value Date   HGBA1C 5.8 (H) 08/22/2020   HGBA1C 5.5 08/27/2015   Lab Results  Component Value Date   INSULIN 6.9 08/22/2020   Lab Results  Component Value Date   TSH <0.005 (L) 08/22/2020   Lab Results  Component Value Date   CHOL 160 02/11/2017   HDL 41 (L) 02/11/2017   LDLCALC 94 02/11/2017   TRIG 149 02/11/2017   CHOLHDL 3.9 02/11/2017   Lab Results  Component Value Date   VD25OH 30.4 08/22/2020   VD25OH 28 (L)  02/11/2017   VD25OH 29.8 05/07/2016   Lab Results  Component Value Date   WBC 6.5 08/22/2020   HGB 14.9 08/22/2020   HCT 45.4 08/22/2020   MCV 87 08/22/2020   PLT 222 02/11/2017   No results found for: IRON, TIBC, FERRITIN  Obesity Behavioral Intervention:   Approximately 15 minutes were spent on the discussion below.  ASK: We discussed the diagnosis of obesity with [name] today and [name] agreed to give [place] permission to discuss obesity behavioral modification therapy today.  ASSESS: [name] has the  diagnosis of obesity and her BMI today is 28.7. [name] is in the action stage of change.   ADVISE: [name] was educated on the multiple health risks of obesity as well as the benefit of weight loss to improve her health. She was advised of the need for long term treatment and the importance of lifestyle modifications to improve her current health and to decrease her risk of future health problems.  AGREE: Multiple dietary modification options and treatment options were discussed and [name] agreed to follow the recommendations documented in the above note.  ARRANGE: [name] was educated on the importance of frequent visits to treat obesity as outlined per CMS and USPSTF guidelines and agreed to schedule her next follow up appointment today.  Attestation Statements:   Reviewed by clinician on day of visit: allergies, medications, problem list, medical history, surgical history, family history, social history, and previous encounter notes.  I, Lizbeth Bark, RMA, am acting as Location manager for [employer], DO.   I have reviewed the above documentation for accuracy and completeness, and I agree with the above. Jearld Lesch, DO

## 2020-10-03 ENCOUNTER — Encounter (INDEPENDENT_AMBULATORY_CARE_PROVIDER_SITE_OTHER): Payer: Self-pay | Admitting: Adult Health

## 2020-10-03 ENCOUNTER — Other Ambulatory Visit: Payer: Self-pay

## 2020-10-03 ENCOUNTER — Ambulatory Visit (INDEPENDENT_AMBULATORY_CARE_PROVIDER_SITE_OTHER): Payer: Medicare HMO | Admitting: Adult Health

## 2020-10-03 VITALS — BP 127/77 | HR 76 | Temp 98.2°F | Ht 66.0 in | Wt 175.0 lb

## 2020-10-03 DIAGNOSIS — Z6830 Body mass index (BMI) 30.0-30.9, adult: Secondary | ICD-10-CM

## 2020-10-03 DIAGNOSIS — E669 Obesity, unspecified: Secondary | ICD-10-CM

## 2020-10-03 DIAGNOSIS — E559 Vitamin D deficiency, unspecified: Secondary | ICD-10-CM

## 2020-10-03 DIAGNOSIS — R7303 Prediabetes: Secondary | ICD-10-CM

## 2020-10-03 MED ORDER — VITAMIN D (ERGOCALCIFEROL) 1.25 MG (50000 UNIT) PO CAPS: 50000.0000 [IU] | ORAL_CAPSULE | ORAL | 0 refills | Status: DC

## 2020-10-05 NOTE — Progress Notes (Signed)
Chief Complaint:   OBESITY [name] is here to discuss her progress with her obesity treatment plan along with follow-up of her obesity related diagnoses. [name] is on the Category 2 Plan and states Brenda is following her eating plan approximately 95% of the time. [name] states Brenda is swimming 60 minutes 5 times per week.  Today's visit was #: 3 Starting weight: 188 lbs Starting date: 08/22/2020 Today's weight: 175 lbs Today's date: 10/03/2020 Total lbs lost to date: 13 lbs Total lbs lost since last in-office visit: 3 lbs  Interim History: [name] will be traveling to [place] for 25 days in September. Brenda is interested in an alternate eating plan during that time.  Subjective:   1. Vitamin D deficiency Brenda is currently taking prescription vitamin D 50,000 IU each week. Brenda denies nausea, vomiting or muscle weakness. Vitamin D level, as of 08/22/20, is 30.4 - below goal of 50.  Lab Results  Component Value Date   VD25OH 30.4 08/22/2020   VD25OH 28 (L) 02/11/2017   VD25OH 29.8 05/07/2016   2. Pre-diabetes [name] has a diagnosis of prediabetes based on her elevated HgA1c and was informed this puts her at greater risk of developing diabetes. Brenda continues to work on diet and exercise to decrease her risk of diabetes. Brenda denies nausea or hypoglycemia.  A1C level, as of 08/22/2020, is 5.8  Lab Results  Component Value Date   HGBA1C 5.8 (H) 08/22/2020   Lab Results  Component Value Date   INSULIN 6.9 08/22/2020   Assessment/Plan:   1. Vitamin D deficiency Low Vitamin D level contributes to fatigue and are associated with obesity, breast, and colon cancer. Brenda agrees to continue to take prescription Vitamin D '@50'$ ,000 IU every week and will follow-up for routine testing of Vitamin D, at least 2-3 times per year to avoid over-replacement.  - Vitamin D, Ergocalciferol, (DRISDOL) 1.25 MG (50000 UNIT) CAPS capsule; Take 1 capsule (50,000 Units total) by mouth every 7 (seven) days.  Dispense: 12  capsule; Refill: 0  2. Pre-diabetes [name] will continue to work on weight loss, exercise, and decreasing simple carbohydrates to help decrease the risk of diabetes. [name] will continue to swim and follow category 2 plan.  3. Obesity with current BMI 28.4 [name] is currently in the action stage of change. As such, her goal is to continue with weight loss efforts. Brenda has agreed to the Category 2 Plan. [name] will journal while in [place].  Exercise goals: As is  Behavioral modification strategies: increasing lean protein intake, decreasing simple carbohydrates, meal planning and cooking strategies, keeping healthy foods in the home, and planning for success.  [name] has agreed to follow-up with our clinic in 2 weeks. [name] for her multiple health conditions.   Objective:   Blood pressure 127/77, pulse 76, temperature 98.2 F (36.8 C), height '5\' 6"'$  (1.676 m), weight 175 lb (79.4 kg), last menstrual period 02/25/1995, SpO2 96 %. Body mass index is 28.25 kg/m.  General: Cooperative, alert, well developed, in no acute distress. HEENT: Conjunctivae and lids unremarkable. Cardiovascular: Regular rhythm.  Lungs: Normal work of breathing. Neurologic: No focal deficits.   Lab Results  Component Value Date   CREATININE 0.55 (L) 08/22/2020   BUN 11 08/22/2020   NA 142 08/22/2020   K 4.7 08/22/2020   CL 101 08/22/2020   CO2 24 08/22/2020   Lab Results  Component  Value Date   ALT 21 08/22/2020   AST 19 08/22/2020   ALKPHOS 91 08/22/2020   BILITOT 0.4 08/22/2020   Lab Results  Component Value Date   HGBA1C 5.8 (H) 08/22/2020   HGBA1C 5.5 08/27/2015   Lab Results  Component Value Date   INSULIN 6.9 08/22/2020   Lab Results  Component Value Date   TSH <0.005 (L) 08/22/2020   Lab Results  Component Value Date   CHOL 160 02/11/2017   HDL 41 (L) 02/11/2017   LDLCALC  94 02/11/2017   TRIG 149 02/11/2017   CHOLHDL 3.9 02/11/2017   Lab Results  Component Value Date   VD25OH 30.4 08/22/2020   VD25OH 28 (L) 02/11/2017   VD25OH 29.8 05/07/2016   Lab Results  Component Value Date   WBC 6.5 08/22/2020   HGB 14.9 08/22/2020   HCT 45.4 08/22/2020   MCV 87 08/22/2020   PLT 222 02/11/2017   No results found for: IRON, TIBC, FERRITIN  Obesity Behavioral Intervention:   Approximately 15 minutes were spent on the discussion below.  ASK: We discussed the diagnosis of obesity with [name] today and [name] agreed to give [place] permission to discuss obesity behavioral modification therapy today.  ASSESS: [name] has the diagnosis of obesity and her BMI today is 28.4. [name] is in the action stage of change.   ADVISE: [name] was educated on the multiple health risks of obesity as well as the benefit of weight loss to improve her health. Brenda was advised of the need for long term treatment and the importance of lifestyle Green to improve her current health and to decrease her risk of future health problems.  AGREE: Multiple dietary modification options and treatment options were discussed and [name] agreed to follow the recommendations documented in the above note.  ARRANGE: [name] was educated on the importance of frequent visits to treat obesity as outlined per CMS and USPSTF guidelines and agreed to schedule her next follow up appointment today.  Attestation Statements:   Reviewed by clinician on day of visit: allergies, medications, problem list, medical history, surgical history, family history, social history, and previous encounter notes.  ILennette Green, CMA, am acting as transcriptionist for Mina Marble, NP-C  I have reviewed the above documentation for accuracy and completeness, and I agree with the above. -  Jonavin Seder d. Brenda Grivas, NP-C

## 2020-10-15 ENCOUNTER — Other Ambulatory Visit: Payer: Self-pay

## 2020-10-15 ENCOUNTER — Ambulatory Visit (INDEPENDENT_AMBULATORY_CARE_PROVIDER_SITE_OTHER): Payer: Medicare HMO | Admitting: Family Medicine

## 2020-10-15 ENCOUNTER — Encounter (INDEPENDENT_AMBULATORY_CARE_PROVIDER_SITE_OTHER): Payer: Self-pay | Admitting: Family Medicine

## 2020-10-15 VITALS — BP 117/78 | HR 86 | Temp 98.0°F | Ht 66.0 in | Wt 171.0 lb

## 2020-10-15 DIAGNOSIS — Z6830 Body mass index (BMI) 30.0-30.9, adult: Secondary | ICD-10-CM | POA: Diagnosis not present

## 2020-10-15 DIAGNOSIS — E669 Obesity, unspecified: Secondary | ICD-10-CM | POA: Diagnosis not present

## 2020-10-15 DIAGNOSIS — M25561 Pain in right knee: Secondary | ICD-10-CM

## 2020-10-15 NOTE — Progress Notes (Signed)
Chief Complaint:   OBESITY [name] is here to discuss her progress with her obesity treatment plan along with follow-up of her obesity related diagnoses. [name] is on the Category 2 Plan and states she is following her eating plan approximately 95% of the time. [name] states she is swimming for 60 minutes 6 times per week.  Today's visit was #: 4 Starting weight: 188 lbs Starting date: 08/22/2020 Today's weight: 171 lbs Today's date: 10/15/2020 Total lbs lost to date: 17 Total lbs lost since last in-office visit: 4  Interim History: [name] continues to do well with weight loss. She is pleasantly surprised that she has done so well with eating so much food. She remains active with swimming most days.  Subjective:   1. Right knee pain, unspecified chronicity [name] has done well with weight loss and her anti-inflammatory eating plan. She notes significant decrease in pain in her knees and she is happy with her progress.  Assessment/Plan:   1. Right knee pain, unspecified chronicity [name] will continue to work on diet, exercise, and weight loss.  2. Obesity with current BMI 27.7 [name] is currently in the action stage of change. As such, her goal is to continue with weight loss efforts. She has agreed to the Category 2 Plan.   Exercise goals: As is.  Behavioral modification strategies: increasing lean protein intake.  [name] has agreed to follow-up with our clinic in 2 weeks. She was informed of the importance of frequent follow-up visits to maximize her success with intensive lifestyle modifications for her multiple health conditions.   Objective:   Blood pressure 117/78, pulse 86, temperature 98 F (36.7 C), height '5\' 6"'$  (1.676 m), weight 171 lb (77.6 kg), last menstrual period 02/25/1995, SpO2 96 %. Body mass index is 27.6 kg/m.  General: Cooperative, alert, well developed, in no acute distress. HEENT: Conjunctivae and lids unremarkable. Cardiovascular: Regular rhythm.  Lungs:  Normal work of breathing. Neurologic: No focal deficits.   Lab Results  Component Value Date   CREATININE 0.55 (L) 08/22/2020   BUN 11 08/22/2020   NA 142 08/22/2020   K 4.7 08/22/2020   CL 101 08/22/2020   CO2 24 08/22/2020   Lab Results  Component Value Date   ALT 21 08/22/2020   AST 19 08/22/2020   ALKPHOS 91 08/22/2020   BILITOT 0.4 08/22/2020   Lab Results  Component Value Date   HGBA1C 5.8 (H) 08/22/2020   HGBA1C 5.5 08/27/2015   Lab Results  Component Value Date   INSULIN 6.9 08/22/2020   Lab Results  Component Value Date   TSH <0.005 (L) 08/22/2020   Lab Results  Component Value Date   CHOL 160 02/11/2017   HDL 41 (L) 02/11/2017   LDLCALC 94 02/11/2017   TRIG 149 02/11/2017   CHOLHDL 3.9 02/11/2017   Lab Results  Component Value Date   VD25OH 30.4 08/22/2020   VD25OH 28 (L) 02/11/2017   VD25OH 29.8 05/07/2016   Lab Results  Component Value Date   WBC 6.5 08/22/2020   HGB 14.9 08/22/2020   HCT 45.4 08/22/2020   MCV 87 08/22/2020   PLT 222 02/11/2017   No results found for: IRON, TIBC, FERRITIN  Attestation Statements:   Reviewed by clinician on day of visit: allergies, medications, problem list, medical history, surgical history, family history, social history, and previous encounter notes.  Time spent on visit including pre-visit chart review and post-visit care and charting was 36 minutes.    Clide Dales  Hassell Done, am acting as Location manager for Dennard Nip, MD.  I have reviewed the above documentation for accuracy and completeness, and I agree with the above. -  Dennard Nip, MD

## 2020-10-23 ENCOUNTER — Ambulatory Visit (INDEPENDENT_AMBULATORY_CARE_PROVIDER_SITE_OTHER): Payer: Medicare HMO | Admitting: Adult Health

## 2020-10-30 ENCOUNTER — Other Ambulatory Visit: Payer: Self-pay

## 2020-10-30 ENCOUNTER — Ambulatory Visit (INDEPENDENT_AMBULATORY_CARE_PROVIDER_SITE_OTHER): Payer: Medicare HMO | Admitting: Family Medicine

## 2020-10-30 ENCOUNTER — Encounter (INDEPENDENT_AMBULATORY_CARE_PROVIDER_SITE_OTHER): Payer: Self-pay | Admitting: Family Medicine

## 2020-10-30 VITALS — BP 117/75 | HR 80 | Temp 98.0°F | Ht 66.0 in | Wt 169.0 lb

## 2020-10-30 DIAGNOSIS — E559 Vitamin D deficiency, unspecified: Secondary | ICD-10-CM

## 2020-10-30 DIAGNOSIS — M25562 Pain in left knee: Secondary | ICD-10-CM | POA: Diagnosis not present

## 2020-10-30 DIAGNOSIS — M25561 Pain in right knee: Secondary | ICD-10-CM | POA: Diagnosis not present

## 2020-10-30 DIAGNOSIS — E669 Obesity, unspecified: Secondary | ICD-10-CM | POA: Diagnosis not present

## 2020-10-30 DIAGNOSIS — Z6830 Body mass index (BMI) 30.0-30.9, adult: Secondary | ICD-10-CM

## 2020-10-30 MED ORDER — VITAMIN D (ERGOCALCIFEROL) 1.25 MG (50000 UNIT) PO CAPS: 50000.0000 [IU] | ORAL_CAPSULE | ORAL | 0 refills | Status: DC

## 2020-10-31 ENCOUNTER — Encounter: Payer: Self-pay | Admitting: Neurology

## 2020-10-31 ENCOUNTER — Other Ambulatory Visit: Payer: Self-pay

## 2020-10-31 MED ORDER — TRAMADOL HCL 50 MG PO TABS: 50.0000 mg | ORAL_TABLET | Freq: Three times a day (TID) | ORAL | 4 refills | Status: DC

## 2020-10-31 NOTE — Progress Notes (Signed)
Pt leaving for [place] on 11/15/20, asking for an early refill on Tramadol.  Early refill approved, pharmacy notified.

## 2020-10-31 NOTE — Progress Notes (Signed)
Chief Complaint:   OBESITY [name] is here to discuss her progress with her obesity treatment plan along with follow-up of her obesity related diagnoses. [name] is on the Category 2 Plan and states she is following her eating plan approximately 90% of the time. [name] states she is swimming and doing water aerobics for 60 minutes 5-6 times per week.  Today's visit was #: 5 Starting weight: 188 lbs Starting date: 08/22/2020 Today's weight: 169 lbs Today's date: 10/30/2020 Total lbs lost to date: 19 Total lbs lost since last in-office visit: 2  Interim History: [name] continues to do well with weight loss. She has eaten out a couple of times and she tried to make better decisions. She noted some unintentional sabotage.  Subjective:   1. Vitamin D deficiency [name]'s Vit D level is stable and she has no signs of over-replacement.  2. Pain in both knees, unspecified chronicity [name] continues to do well with weight loss so she can improve her knee pain.  Assessment/Plan:   1. Vitamin D deficiency Low Vitamin D level contributes to fatigue and are associated with obesity, breast, and colon cancer. We will refill prescription Vitamin D for 90 days with no refills. [name] will follow-up for routine testing of Vitamin D, at least 2-3 times per year to avoid over-replacement.  - Vitamin D, Ergocalciferol, (DRISDOL) 1.25 MG (50000 UNIT) CAPS capsule; Take 1 capsule (50,000 Units total) by mouth every 7 (seven) days.  Dispense: 12 capsule; Refill: 0  2. Pain in both knees, unspecified chronicity [name] will continue with diet and walking, and will continue to follow up as directed.  3. Obesity with current BMI 27.3 [name] is currently in the action stage of change. As such, her goal is to continue with weight loss efforts. She has agreed to the Category 2 Plan with breakfast and lunch options.   Exercise goals: As is.  Behavioral modification strategies: dealing with friend sabotage.  [name] has agreed  to follow-up with our clinic in 2 weeks. She was informed of the importance of frequent follow-up visits to maximize her success with intensive lifestyle modifications for her multiple health conditions.   Objective:   Blood pressure 117/75, pulse 80, temperature 98 F (36.7 C), height '5\' 6"'$  (1.676 m), weight 169 lb (76.7 kg), last menstrual period 02/25/1995, SpO2 93 %. Body mass index is 27.28 kg/m.  General: Cooperative, alert, well developed, in no acute distress. HEENT: Conjunctivae and lids unremarkable. Cardiovascular: Regular rhythm.  Lungs: Normal work of breathing. Neurologic: No focal deficits.   Lab Results  Component Value Date   CREATININE 0.55 (L) 08/22/2020   BUN 11 08/22/2020   NA 142 08/22/2020   K 4.7 08/22/2020   CL 101 08/22/2020   CO2 24 08/22/2020   Lab Results  Component Value Date   ALT 21 08/22/2020   AST 19 08/22/2020   ALKPHOS 91 08/22/2020   BILITOT 0.4 08/22/2020   Lab Results  Component Value Date   HGBA1C 5.8 (H) 08/22/2020   HGBA1C 5.5 08/27/2015   Lab Results  Component Value Date   INSULIN 6.9 08/22/2020   Lab Results  Component Value Date   TSH <0.005 (L) 08/22/2020   Lab Results  Component Value Date   CHOL 160 02/11/2017   HDL 41 (L) 02/11/2017   LDLCALC 94 02/11/2017   TRIG 149 02/11/2017   CHOLHDL 3.9 02/11/2017   Lab Results  Component Value Date   VD25OH 30.4 08/22/2020   VD25OH 28 (  L) 02/11/2017   VD25OH 29.8 05/07/2016   Lab Results  Component Value Date   WBC 6.5 08/22/2020   HGB 14.9 08/22/2020   HCT 45.4 08/22/2020   MCV 87 08/22/2020   PLT 222 02/11/2017   No results found for: IRON, TIBC, FERRITIN  Obesity Behavioral Intervention:   Approximately 15 minutes were spent on the discussion below.  ASK: We discussed the diagnosis of obesity with [name] today and [name] agreed to give [place] permission to discuss obesity behavioral modification therapy today.  ASSESS: [name] has the diagnosis of obesity and  her BMI today is 27.3. [name] is in the action stage of change.   ADVISE: [name] was educated on the multiple health risks of obesity as well as the benefit of weight loss to improve her health. She was advised of the need for long term treatment and the importance of lifestyle modifications to improve her current health and to decrease her risk of future health problems.  AGREE: Multiple dietary modification options and treatment options were discussed and [name] agreed to follow the recommendations documented in the above note.  ARRANGE: [name] was educated on the importance of frequent visits to treat obesity as outlined per CMS and USPSTF guidelines and agreed to schedule her next follow up appointment today.  Attestation Statements:   Reviewed by clinician on day of visit: allergies, medications, problem list, medical history, surgical history, family history, social history, and previous encounter notes.   I, Trixie Dredge, am acting as transcriptionist for Dennard Nip, MD.  I have reviewed the above documentation for accuracy and completeness, and I agree with the above. -  Dennard Nip, MD

## 2020-11-12 ENCOUNTER — Encounter (INDEPENDENT_AMBULATORY_CARE_PROVIDER_SITE_OTHER): Payer: Self-pay | Admitting: Family Medicine

## 2020-11-12 ENCOUNTER — Ambulatory Visit (INDEPENDENT_AMBULATORY_CARE_PROVIDER_SITE_OTHER): Payer: Medicare HMO | Admitting: Family Medicine

## 2020-11-12 ENCOUNTER — Other Ambulatory Visit: Payer: Self-pay

## 2020-11-12 VITALS — BP 120/81 | HR 82 | Temp 98.1°F | Ht 66.0 in | Wt 168.0 lb

## 2020-11-12 DIAGNOSIS — Z6830 Body mass index (BMI) 30.0-30.9, adult: Secondary | ICD-10-CM | POA: Diagnosis not present

## 2020-11-12 DIAGNOSIS — R7303 Prediabetes: Secondary | ICD-10-CM

## 2020-11-12 DIAGNOSIS — E669 Obesity, unspecified: Secondary | ICD-10-CM

## 2020-11-12 NOTE — Progress Notes (Signed)
Chief Complaint:   OBESITY [name] is here to discuss her progress with her obesity treatment plan along with follow-up of her obesity related diagnoses. [name] is on the Category 2 Plan with breakfast and lunch options and states she is following her eating plan approximately 75% of the time. [name] states she is doing water aerobics for 60 minutes 2 times per week.  Today's visit was #: 6 Starting weight: 188 lbs Starting date: 08/22/2020 Today's weight: 168 lbs Today's date: 11/12/2020 Total lbs lost to date: 20 Total lbs lost since last in-office visit: 1  Interim History: [name] continues to do well with weight loss. She is going on a 3 week vacation to [place] and she is open to discussing vacation damage control strategies to minimize vacation weight gain.  Subjective:   1. Pre-diabetes [name] continues to work on diet, exercise, and weight loss. Her hunger is mostly controlled and she is doing well decreasing her simple carbohydrates in her diet.  Assessment/Plan:   1. Pre-diabetes [name] will continue to work on diet, exercise, and decreasing simple carbohydrates to help decrease the risk of diabetes. We will recheck labs in 1 month.  2. Obesity with current BMI 27.2 [name] is currently in the action stage of change. As such, her goal is to continue with weight loss efforts. She has agreed to the Category 2 Plan.   Exercise goals: As is.  Behavioral modification strategies: travel eating strategies.  [name] has agreed to follow-up with our clinic in 5 weeks. She was informed of the importance of frequent follow-up visits to maximize her success with intensive lifestyle modifications for her multiple health conditions.   Objective:   Blood pressure 120/81, pulse 82, temperature 98.1 F (36.7 C), height '5\' 6"'$  (1.676 m), weight 168 lb (76.2 kg), last menstrual period 02/25/1995, SpO2 96 %. Body mass index is 27.12 kg/m.  General: Cooperative, alert, well developed, in no acute  distress. HEENT: Conjunctivae and lids unremarkable. Cardiovascular: Regular rhythm.  Lungs: Normal work of breathing. Neurologic: No focal deficits.   Lab Results  Component Value Date   CREATININE 0.55 (L) 08/22/2020   BUN 11 08/22/2020   NA 142 08/22/2020   K 4.7 08/22/2020   CL 101 08/22/2020   CO2 24 08/22/2020   Lab Results  Component Value Date   ALT 21 08/22/2020   AST 19 08/22/2020   ALKPHOS 91 08/22/2020   BILITOT 0.4 08/22/2020   Lab Results  Component Value Date   HGBA1C 5.8 (H) 08/22/2020   HGBA1C 5.5 08/27/2015   Lab Results  Component Value Date   INSULIN 6.9 08/22/2020   Lab Results  Component Value Date   TSH <0.005 (L) 08/22/2020   Lab Results  Component Value Date   CHOL 160 02/11/2017   HDL 41 (L) 02/11/2017   LDLCALC 94 02/11/2017   TRIG 149 02/11/2017   CHOLHDL 3.9 02/11/2017   Lab Results  Component Value Date   VD25OH 30.4 08/22/2020   VD25OH 28 (L) 02/11/2017   VD25OH 29.8 05/07/2016   Lab Results  Component Value Date   WBC 6.5 08/22/2020   HGB 14.9 08/22/2020   HCT 45.4 08/22/2020   MCV 87 08/22/2020   PLT 222 02/11/2017   No results found for: IRON, TIBC, FERRITIN  Attestation Statements:   Reviewed by clinician on day of visit: allergies, medications, problem list, medical history, surgical history, family history, social history, and previous encounter notes.  Time spent on visit including pre-visit  chart review and post-visit care and charting was 40 minutes.    I, Trixie Dredge, am acting as transcriptionist for Dennard Nip, MD.  I have reviewed the above documentation for accuracy and completeness, and I agree with the above. -  Dennard Nip, MD

## 2020-11-13 NOTE — Telephone Encounter (Signed)
Last OV with Dr. Beasley 

## 2020-11-17 ENCOUNTER — Encounter: Payer: Self-pay | Admitting: Neurology

## 2020-12-12 ENCOUNTER — Ambulatory Visit (INDEPENDENT_AMBULATORY_CARE_PROVIDER_SITE_OTHER): Payer: Medicare HMO | Admitting: Family Medicine

## 2020-12-13 ENCOUNTER — Encounter: Payer: Medicare HMO | Admitting: Internal Medicine

## 2020-12-19 ENCOUNTER — Other Ambulatory Visit: Payer: Self-pay | Admitting: Sports Medicine

## 2020-12-19 ENCOUNTER — Ambulatory Visit (INDEPENDENT_AMBULATORY_CARE_PROVIDER_SITE_OTHER): Payer: Medicare HMO | Admitting: Family Medicine

## 2020-12-19 ENCOUNTER — Other Ambulatory Visit: Payer: Self-pay

## 2020-12-19 ENCOUNTER — Encounter (INDEPENDENT_AMBULATORY_CARE_PROVIDER_SITE_OTHER): Payer: Self-pay | Admitting: Family Medicine

## 2020-12-19 VITALS — BP 120/78 | HR 79 | Temp 98.6°F | Ht 66.0 in | Wt 170.0 lb

## 2020-12-19 DIAGNOSIS — R7303 Prediabetes: Secondary | ICD-10-CM | POA: Diagnosis not present

## 2020-12-19 DIAGNOSIS — E559 Vitamin D deficiency, unspecified: Secondary | ICD-10-CM

## 2020-12-19 DIAGNOSIS — Z6830 Body mass index (BMI) 30.0-30.9, adult: Secondary | ICD-10-CM

## 2020-12-19 DIAGNOSIS — E038 Other specified hypothyroidism: Secondary | ICD-10-CM | POA: Diagnosis not present

## 2020-12-19 DIAGNOSIS — E7849 Other hyperlipidemia: Secondary | ICD-10-CM

## 2020-12-19 DIAGNOSIS — E669 Obesity, unspecified: Secondary | ICD-10-CM

## 2020-12-19 NOTE — Progress Notes (Signed)
Chief Complaint:   OBESITY [name] is here to discuss her progress with her obesity treatment plan along with follow-up of her obesity related diagnoses. [name] is on the Category 2 Plan and states she is following her eating plan approximately (unknown)% of the time. [name] states she is walking.   Today's visit was #: 7 Starting weight: 188 lbs Starting date: 08/22/2020 Today's weight: 170 lbs Today's date: 12/19/2020 Total lbs lost to date: 18 Total lbs lost since last in-office visit: 0  Interim History: Alveria was on vacation in [place] and she did well minimizing holiday weight gain. She is open to discussing holiday eating strategies.  Subjective:   1. Pre-diabetes [name] is doing well with diet and exercise, and she is due for labs.  2. Other specified hypothyroidism [name] is stable on Synthroid, and she is followed by Endocrinology. She will be getting her labs done there.  3. Vitamin D deficiency [name] is stable on Vit D, and she is due for labs.  4. Other hyperlipidemia [name] is working on diet and exercise, and she is due for labs.  Assessment/Plan:   1. Pre-diabetes [name] will continue to work on weight loss, exercise, and decreasing simple carbohydrates to help decrease the risk of diabetes. Labs will be rechecked at another time and she will continue to follow up as directed.  - Hemoglobin A1c; Future - Insulin, random  2. Other specified hypothyroidism [name] will have her labs checked at her Endocrinologist, and we will review results when they come in. Orders and follow up as documented in patient record.  - TSH  3. Vitamin D deficiency Low Vitamin D level contributes to fatigue and are associated with obesity, breast, and colon cancer. Labs will be checked at another time. [name] will follow-up for routine testing of Vitamin D, at least 2-3 times per year to avoid over-replacement.  - VITAMIN D 25 Hydroxy (Vit-D Deficiency, Fractures)  4. Other  hyperlipidemia Cardiovascular risk and specific lipid/LDL goals reviewed. We discussed several lifestyle modifications today. Labs will be rechecked at another time. [name] will continue to work on diet, exercise and weight loss efforts. Orders and follow up as documented in patient record.   - Comprehensive metabolic panel - Lipid panel  5. Obesity with current BMI 27.6 [name] is currently in the action stage of change. As such, her goal is to continue with weight loss efforts. She has agreed to the Category 2 Plan.   [name] is to get her labs done at another time.  Exercise goals: As is.  Behavioral modification strategies: increasing lean protein intake and holiday eating strategies .  [name] has agreed to follow-up with our clinic in 6 weeks. She was informed of the importance of frequent follow-up visits to maximize her success with intensive lifestyle modifications for her multiple health conditions.   Objective:   Blood pressure 120/78, pulse 79, temperature 98.6 F (37 C), height 5\' 6"  (1.676 m), weight 170 lb (77.1 kg), last menstrual period 02/25/1995, SpO2 96 %. Body mass index is 27.44 kg/m.  General: Cooperative, alert, well developed, in no acute distress. HEENT: Conjunctivae and lids unremarkable. Cardiovascular: Regular rhythm.  Lungs: Normal work of breathing. Neurologic: No focal deficits.   Lab Results  Component Value Date   CREATININE 0.55 (L) 08/22/2020   BUN 11 08/22/2020   NA 142 08/22/2020   K 4.7 08/22/2020   CL 101 08/22/2020   CO2 24 08/22/2020   Lab Results  Component Value Date  ALT 21 08/22/2020   AST 19 08/22/2020   ALKPHOS 91 08/22/2020   BILITOT 0.4 08/22/2020   Lab Results  Component Value Date   HGBA1C 5.8 (H) 08/22/2020   HGBA1C 5.5 08/27/2015   Lab Results  Component Value Date   INSULIN 6.9 08/22/2020   Lab Results  Component Value Date   TSH <0.005 (L) 08/22/2020   Lab Results  Component Value Date   CHOL 160 02/11/2017    HDL 41 (L) 02/11/2017   LDLCALC 94 02/11/2017   TRIG 149 02/11/2017   CHOLHDL 3.9 02/11/2017   Lab Results  Component Value Date   VD25OH 30.4 08/22/2020   VD25OH 28 (L) 02/11/2017   VD25OH 29.8 05/07/2016   Lab Results  Component Value Date   WBC 6.5 08/22/2020   HGB 14.9 08/22/2020   HCT 45.4 08/22/2020   MCV 87 08/22/2020   PLT 222 02/11/2017   No results found for: IRON, TIBC, FERRITIN  Attestation Statements:   Reviewed by clinician on day of visit: allergies, medications, problem list, medical history, surgical history, family history, social history, and previous encounter notes.  Time spent on visit including pre-visit chart review and post-visit care and charting was 36 minutes.    I, Trixie Dredge, am acting as transcriptionist for Dennard Nip, MD.  I have reviewed the above documentation for accuracy and completeness, and I agree with the above. -  Dennard Nip, MD

## 2020-12-21 ENCOUNTER — Ambulatory Visit: Payer: Self-pay | Admitting: Family

## 2021-01-08 ENCOUNTER — Encounter: Payer: Self-pay | Admitting: Neurology

## 2021-01-09 LAB — HEPATIC FUNCTION PANEL
ALT: 20 (ref 7–35)
AST: 19 (ref 13–35)
Bilirubin, Direct: 0.2 (ref 0.01–0.4)
Bilirubin, Total: 0.6

## 2021-01-09 LAB — LIPID PANEL
Cholesterol: 149 (ref 0–200)
HDL: 46 (ref 35–70)
LDL Cholesterol: 89
LDl/HDL Ratio: 1.9
Triglycerides: 69 (ref 40–160)

## 2021-01-09 LAB — VITAMIN D 25 HYDROXY (VIT D DEFICIENCY, FRACTURES): Vit D, 25-Hydroxy: 39.9

## 2021-01-09 LAB — TSH: TSH: 0.01 — AB (ref 0.41–5.90)

## 2021-01-09 LAB — HEMOGLOBIN A1C: Hemoglobin A1C: 5.3

## 2021-01-09 LAB — COMPREHENSIVE METABOLIC PANEL: Albumin: 3.9 (ref 3.5–5.0)

## 2021-01-09 NOTE — Telephone Encounter (Signed)
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  CPAP is always optional for mild cases, but it made a difference for this patient. I am happy to send Dr Toy Cookey my notes in case Lindie wants to use a dental device instead-   Dr Toy Cookey does her own HST, usually for follow up on therapy. I would be Ok to mail this patient a HST kit since she resides in [place].    Larey Seat, MD

## 2021-01-10 ENCOUNTER — Other Ambulatory Visit (INDEPENDENT_AMBULATORY_CARE_PROVIDER_SITE_OTHER): Payer: Self-pay | Admitting: Family Medicine

## 2021-01-10 DIAGNOSIS — E559 Vitamin D deficiency, unspecified: Secondary | ICD-10-CM

## 2021-01-10 NOTE — Telephone Encounter (Signed)
LAST APPOINTMENT DATE: 12/19/20 NEXT APPOINTMENT DATE: 01/23/21   [id number] - [place] [place] [address] [place] [address] Phone: [phone] Fax: [phone]  Patient is requesting a refill of the following medications: Requested Prescriptions   Pending Prescriptions Disp Refills   Vitamin D, Ergocalciferol, (DRISDOL) 1.25 MG (50000 UNIT) CAPS capsule [Pharmacy Med Name: VITAMIN D2 1.25MG (50,000 UNIT)] 12 capsule 0    Sig: TAKE 1 CAPSULE (50,000 UNITS TOTAL) BY MOUTH EVERY 7 (SEVEN) DAYS    Date last filled: 10/30/20 Previously prescribed by Dr. Leafy Ro  Lab Results  Component Value Date   HGBA1C 5.8 (H) 08/22/2020   HGBA1C 5.5 08/27/2015   Lab Results  Component Value Date   LDLCALC 94 02/11/2017   CREATININE 0.55 (L) 08/22/2020   Lab Results  Component Value Date   VD25OH 30.4 08/22/2020   VD25OH 28 (L) 02/11/2017   VD25OH 29.8 05/07/2016    BP Readings from Last 3 Encounters:  12/19/20 120/78  11/12/20 120/81  10/30/20 117/75

## 2021-01-17 ENCOUNTER — Encounter (INDEPENDENT_AMBULATORY_CARE_PROVIDER_SITE_OTHER): Payer: Self-pay | Admitting: Family Medicine

## 2021-01-21 NOTE — Telephone Encounter (Signed)
Dr.Beasley 

## 2021-01-21 NOTE — Telephone Encounter (Signed)
Please see message and advise.  Thank you. ° °

## 2021-01-22 ENCOUNTER — Ambulatory Visit (INDEPENDENT_AMBULATORY_CARE_PROVIDER_SITE_OTHER): Payer: Medicare HMO | Admitting: Sports Medicine

## 2021-01-22 VITALS — BP 105/60 | Ht 66.0 in | Wt 168.0 lb

## 2021-01-22 DIAGNOSIS — E663 Overweight: Secondary | ICD-10-CM

## 2021-01-22 DIAGNOSIS — M545 Low back pain, unspecified: Secondary | ICD-10-CM

## 2021-01-22 DIAGNOSIS — M542 Cervicalgia: Secondary | ICD-10-CM | POA: Diagnosis not present

## 2021-01-22 DIAGNOSIS — M5030 Other cervical disc degeneration, unspecified cervical region: Secondary | ICD-10-CM

## 2021-01-22 MED ORDER — DOXEPIN HCL 50 MG PO CAPS: ORAL_CAPSULE | ORAL | 1 refills | Status: DC

## 2021-01-22 MED ORDER — DOXEPIN HCL 100 MG PO CAPS
100.0000 mg | ORAL_CAPSULE | Freq: Every day | ORAL | 1 refills | Status: DC
Start: 2021-01-22 — End: 2021-05-10

## 2021-01-22 NOTE — Progress Notes (Signed)
PCP: Reynold Bowen, MD  Subjective:   HPI: Patient is a 80 y.o. female here for follow-up of neck pain and headaches, as well as low back pain.  Neck pain, cervical DDD -she is doing well with this.  Her pain is well managed on tramadol 50 mg 3 times a day as well as doxepin 50 mg twice daily, 100 mg nightly.  She continues with her home exercises for neck range of motion, shoulder protraction once daily.  Her headaches are also controlled on this regimen.  Lumbar spine pain -she continues with Williams flexion exercises, doing these once daily with good relief.  She also has gotten into the local pool and is doing aquatic therapy 5-6 times weekly.  She reports she has lost about 20 pounds through [facility] [employer] health weight loss program, which helps improve her back and her knee pain.  Well-controlled on medications as above.  Denies any new injuries.  Feels she is doing well and is happy with her current medical regimen.  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   Hyperlipidemia    Impaired hearing    left ear, wears hearing aids   Insomnia    circadian rhythm component   Insomnia    Migraine 11/1986   [place]   Nuclear sclerosis    OSA on CPAP 03/2005   uses cpap setting of 10   Osteoarthritis 2007   [place]   Pain last week   left under breast pain    Plantar fasciitis    Rheumatic fever    Scarlet fever as child   Sleep apnea    Swallowing difficulty    Thyroid disorder    Vitamin D deficiency    Vitreous degeneration of right eye     Current Outpatient Medications on File Prior to Visit  Medication Sig Dispense Refill   aspirin EC 81 MG tablet Take 81 mg by mouth daily.     doxepin (SINEQUAN) 100 MG capsule Take 1 capsule (100  mg total) by mouth at bedtime. 90 capsule 1   doxepin (SINEQUAN) 50 MG capsule Take 1 capsule (50 mg) by mouth each morning 90 capsule 1   Eszopiclone 3 MG TABS Take 3 mg by mouth at bedtime. Take immediately before bedtime     levothyroxine (SYNTHROID, LEVOTHROID) 200 MCG tablet Take 200 mcg by mouth daily before breakfast.     LORazepam (ATIVAN) 1 MG tablet Take 1 tablet (1 mg total) by mouth 2 (two) times daily. (Patient taking differently: Take 1 mg by mouth 3 (three) times daily.) 30 tablet 1   lubiprostone (AMITIZA) 24 MCG capsule Take 24 mcg by mouth 2 (two) times daily with a meal.     MELATONIN PO Take 20 mg by mouth at bedtime.      montelukast (SINGULAIR) 10 MG tablet Take 10 mg by mouth every evening.  3   polyethylene glycol (MIRALAX / GLYCOLAX) packet Take 17 g by mouth daily.     traMADol (ULTRAM) 50 MG tablet Take 1 tablet (50 mg total) by mouth in the morning, at noon, and at bedtime. 90 tablet 4   UBRELVY 50 MG TABS Take by mouth as needed.  valACYclovir (VALTREX) 500 MG tablet TAKE 1 TABLET EVERY DAY INCREASE TO 1 TABLET TWICE DAILY FOR 3 DAYS AS NEEDED 90 tablet 2   Vitamin D, Ergocalciferol, (DRISDOL) 1.25 MG (50000 UNIT) CAPS capsule TAKE 1 CAPSULE (50,000 UNITS TOTAL) BY MOUTH EVERY 7 (SEVEN) DAYS 12 capsule 0   No current facility-administered medications on file prior to visit.    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   TOTAL KNEE ARTHROPLASTY Right 11/08/2012   Procedure: RIGHT TOTAL KNEE ARTHROPLASTY;  Surgeon: Gearlean Alf, MD;  Location: [facility];  Service: Orthopedics;  Laterality: Right;   TUBAL LIGATION      Allergies  Allergen Reactions   Penicillins Anaphylaxis     Tongue swelling   Codeine Nausea Only   Oxycodone-Acetaminophen    Penicillamine    Provigil [Modafinil]    Seroquel [Quetiapine Fumarate]    Xyrem [Oxybate]     hallucination   Ciprofloxacin Rash   Monistat [Miconazole] Rash   Terazol [Terconazole] Rash    BP 105/60   Ht 5\' 6"  (1.676 m)   Wt 168 lb (76.2 kg)   LMP 02/25/1995 (Approximate)   BMI 27.12 kg/m   No flowsheet data found.  No flowsheet data found.      Objective:  Physical Exam:  Gen: Well-appearing, in no acute distress; non-toxic CV: Regular Rate. Well-perfused. Warm.  Resp: Breathing unlabored on room air; no wheezing. Psych: Fluid speech in conversation; appropriate affect; normal thought process Neuro: Sensation intact throughout. No gross coordination deficits.  MSK:   - Cervical spine: No midline spinous process TTP.  There is a mild forward mutation of the head.  Slight limited flexion as chin cannot quite touch the sternal bone.  Approximately 50 degrees of rotation to the left and right.  5/5 strength bilateral upper extremities.  Neurovascular intact distally  - Lumbar spine: No midline spinous process TTP.  Full range of motion of the lumbar spine in flexion and extension without pain.  5/5 strength bilateral lower extremities.  Nonantalgic gait.  Neurovascular intact distally.   Assessment & Plan:  1.  Degenerative disc disease, cervical 2.  Lumbar spine pain 3.  History of headaches, cervicalgia 4.  Obesity - has lost 20 lbs over last 6 months!  Patient doing well on above regimen.  We will refill her doxepin.  To continue 50 mg in the morning, 50 mg at lunch, 100 mg nightly.  Dr. Oneida Alar will refill her tramadol order to be taken 50 mg 3 times daily.  We did discuss risk/benefit/indication for these.  She is to continue her cervical neck home exercises as well as Williams flexion exercises for the low back.  Encourage continued aquatic therapy, and congratulated on her weight loss.  At this point  she will follow-up in 3 months unless any issues arise which she may call or present sooner.  Elba Barman, DO PGY-4, Sports Medicine Fellow [place]  I observed and examined the patient with the [facility] resident and agree with assessment and plan.  Note reviewed and modified by me. Ila Mcgill, MD

## 2021-01-23 ENCOUNTER — Ambulatory Visit (INDEPENDENT_AMBULATORY_CARE_PROVIDER_SITE_OTHER): Payer: Medicare HMO | Admitting: Family Medicine

## 2021-01-23 ENCOUNTER — Encounter (INDEPENDENT_AMBULATORY_CARE_PROVIDER_SITE_OTHER): Payer: Self-pay | Admitting: Family Medicine

## 2021-01-23 ENCOUNTER — Other Ambulatory Visit: Payer: Self-pay

## 2021-01-23 VITALS — BP 120/77 | HR 78 | Temp 98.2°F | Ht 66.0 in | Wt 166.0 lb

## 2021-01-23 DIAGNOSIS — R7303 Prediabetes: Secondary | ICD-10-CM

## 2021-01-23 DIAGNOSIS — E669 Obesity, unspecified: Secondary | ICD-10-CM

## 2021-01-23 DIAGNOSIS — Z6830 Body mass index (BMI) 30.0-30.9, adult: Secondary | ICD-10-CM

## 2021-01-23 DIAGNOSIS — E559 Vitamin D deficiency, unspecified: Secondary | ICD-10-CM

## 2021-01-23 NOTE — Progress Notes (Signed)
1130

## 2021-01-23 NOTE — Progress Notes (Signed)
Chief Complaint:   OBESITY [name] is here to discuss her progress with her obesity treatment plan along with follow-up of her obesity related diagnoses. [name] is on the Category 2 Plan and states she is following her eating plan approximately 80% of the time. [name] states she is doing water aerobics 5 times per week.   Today's visit was #: 8 Starting weight: 188 lbs Starting date: 08/22/2020 Today's weight: 166 lbs Today's date: 01/23/2021 Total lbs lost to date: 22 Total lbs lost since last in-office visit: 4  Interim History: [name] continues to do very well with weight loss. Her hunger is mostly controlled and she did well even with company over Thanksgiving. She is exercising in the water, but not doing targeted strengthening exercise yet.  Subjective:   1. Vitamin D deficiency [name]'s last Vit D level was lower than goal on prescription Vit D. She had labs done at her primary care physician's office recently but results are not in [place].  2. Pre-diabetes [name] continues to do well with diet and weight loss. She had labs done at her primary care physician's office recently but results are not in [place].  Assessment/Plan:   1. Vitamin D deficiency Low Vitamin D level contributes to fatigue and are associated with obesity, breast, and colon cancer. We will obtain [name]'s labs from her primary care physician, and will follow-up for routine testing of Vitamin D, at least 2-3 times per year to avoid over-replacement.  2. Pre-diabetes [name] will continue to work on weight loss, exercise, and decreasing simple carbohydrates to help decrease the risk of diabetes. We will obtain [name]'s labs from her primary care physician.  3. Obesity with current BMI of 26.9 [name] is currently in the action stage of change. As such, her goal is to continue with weight loss efforts. She has agreed to the Category 2 Plan.   Exercise goals: As is, add strengthening exercise and options were discussed  today.  Behavioral modification strategies: meal planning and cooking strategies.  [name] has agreed to follow-up with our clinic in 3 weeks. She was informed of the importance of frequent follow-up visits to maximize her success with intensive lifestyle modifications for her multiple health conditions.   Objective:   Blood pressure 120/77, pulse 78, temperature 98.2 F (36.8 C), height 5\' 6"  (1.676 m), weight 166 lb (75.3 kg), last menstrual period 02/25/1995, SpO2 96 %. Body mass index is 26.79 kg/m.  General: Cooperative, alert, well developed, in no acute distress. HEENT: Conjunctivae and lids unremarkable. Cardiovascular: Regular rhythm.  Lungs: Normal work of breathing. Neurologic: No focal deficits.   Lab Results  Component Value Date   CREATININE 0.55 (L) 08/22/2020   BUN 11 08/22/2020   NA 142 08/22/2020   K 4.7 08/22/2020   CL 101 08/22/2020   CO2 24 08/22/2020   Lab Results  Component Value Date   ALT 20 01/09/2021   AST 19 01/09/2021   ALKPHOS 91 08/22/2020   BILITOT 0.4 08/22/2020   Lab Results  Component Value Date   HGBA1C 5.3 01/09/2021   HGBA1C 5.8 (H) 08/22/2020   HGBA1C 5.5 08/27/2015   Lab Results  Component Value Date   INSULIN 6.9 08/22/2020   Lab Results  Component Value Date   TSH 0.01 (A) 01/09/2021   Lab Results  Component Value Date   CHOL 149 01/09/2021   HDL 46 01/09/2021   LDLCALC 89 01/09/2021   TRIG 69 01/09/2021   CHOLHDL 3.9 02/11/2017  Lab Results  Component Value Date   VD25OH 39.9 01/09/2021   VD25OH 30.4 08/22/2020   VD25OH 28 (L) 02/11/2017   Lab Results  Component Value Date   WBC 6.5 08/22/2020   HGB 14.9 08/22/2020   HCT 45.4 08/22/2020   MCV 87 08/22/2020   PLT 222 02/11/2017   No results found for: IRON, TIBC, FERRITIN  Attestation Statements:   Reviewed by clinician on day of visit: allergies, medications, problem list, medical history, surgical history, family history, social history, and previous  encounter notes.  Time spent on visit including pre-visit chart review and post-visit care and charting was 25 minutes.    I, Trixie Dredge, am acting as transcriptionist for Dennard Nip, MD.  I have reviewed the above documentation for accuracy and completeness, and I agree with the above. -  Dennard Nip, MD

## 2021-01-24 ENCOUNTER — Other Ambulatory Visit: Payer: Self-pay | Admitting: Sports Medicine

## 2021-01-24 MED ORDER — TRAMADOL HCL 50 MG PO TABS: 50.0000 mg | ORAL_TABLET | Freq: Three times a day (TID) | ORAL | 4 refills | Status: DC

## 2021-01-29 ENCOUNTER — Other Ambulatory Visit (INDEPENDENT_AMBULATORY_CARE_PROVIDER_SITE_OTHER): Payer: Self-pay | Admitting: Family Medicine

## 2021-01-29 DIAGNOSIS — E559 Vitamin D deficiency, unspecified: Secondary | ICD-10-CM

## 2021-02-01 ENCOUNTER — Other Ambulatory Visit: Payer: Self-pay | Admitting: Endocrinology

## 2021-02-04 ENCOUNTER — Other Ambulatory Visit: Payer: Self-pay | Admitting: Endocrinology

## 2021-02-04 DIAGNOSIS — Z0000 Encounter for general adult medical examination without abnormal findings: Secondary | ICD-10-CM

## 2021-02-04 DIAGNOSIS — E785 Hyperlipidemia, unspecified: Secondary | ICD-10-CM

## 2021-02-12 ENCOUNTER — Other Ambulatory Visit (INDEPENDENT_AMBULATORY_CARE_PROVIDER_SITE_OTHER): Payer: Self-pay | Admitting: Family Medicine

## 2021-02-12 ENCOUNTER — Ambulatory Visit (INDEPENDENT_AMBULATORY_CARE_PROVIDER_SITE_OTHER): Payer: Medicare HMO | Admitting: Family Medicine

## 2021-02-12 ENCOUNTER — Encounter (INDEPENDENT_AMBULATORY_CARE_PROVIDER_SITE_OTHER): Payer: Self-pay | Admitting: Family Medicine

## 2021-02-12 ENCOUNTER — Other Ambulatory Visit: Payer: Self-pay

## 2021-02-12 VITALS — BP 113/70 | HR 79 | Temp 98.2°F | Ht 66.0 in | Wt 166.0 lb

## 2021-02-12 DIAGNOSIS — E559 Vitamin D deficiency, unspecified: Secondary | ICD-10-CM

## 2021-02-12 DIAGNOSIS — E669 Obesity, unspecified: Secondary | ICD-10-CM | POA: Diagnosis not present

## 2021-02-12 DIAGNOSIS — Z6830 Body mass index (BMI) 30.0-30.9, adult: Secondary | ICD-10-CM | POA: Diagnosis not present

## 2021-02-12 MED ORDER — VITAMIN D (ERGOCALCIFEROL) 1.25 MG (50000 UNIT) PO CAPS: 50000.0000 [IU] | ORAL_CAPSULE | ORAL | 0 refills | Status: DC

## 2021-02-12 NOTE — Telephone Encounter (Signed)
Dr.Beasley 

## 2021-02-13 NOTE — Progress Notes (Signed)
Chief Complaint:   OBESITY [name] is here to discuss her progress with her obesity treatment plan along with follow-up of her obesity related diagnoses. [name] is on the Category 2 Plan and states she is following her eating plan approximately 75% of the time. [name] states she is doing water walking and aerobics 5 times per week.  Today's visit was #: 9 Starting weight: 188 lbs Starting date: 08/22/2020 Today's weight: 166 lbs Today's date: 02/12/2021 Total lbs lost to date: 22 Total lbs lost since last in-office visit: 0  Interim History: [name] has done well with maintaining her weight even with extra temptations. She has questions about her weight goals.  Subjective:   1. Vitamin D deficiency [name] is on Vit D, and she denies nausea, vomiting, or muscle weakness. She requests a 90 day refill.  Assessment/Plan:   1. Vitamin D deficiency We will refill prescription Vitamin D 50,000 IU every week for 90 days with no refills. [name] will follow-up for routine testing of Vitamin D, at least 2-3 times per year to avoid over-replacement.  - Vitamin D, Ergocalciferol, (DRISDOL) 1.25 MG (50000 UNIT) CAPS capsule; Take 1 capsule (50,000 Units total) by mouth every 7 (seven) days.  Dispense: 12 capsule; Refill: 0  2. Obesity BMI today is 50 [name] is currently in the action stage of change. As such, her goal is to continue with weight loss efforts. She has agreed to the Category 2 Plan.   [name] is very close to her goal, and instead of working on further weight loss she should instead concentrate on core strengthening.  Exercise goals: As is, and adding chair yoga videos were discussed.  Behavioral modification strategies: increasing lean protein intake, meal planning and cooking strategies, and holiday eating strategies .  [name] has agreed to follow-up with our clinic in 4 weeks. She was informed of the importance of frequent follow-up visits to maximize her success with intensive lifestyle  modifications for her multiple health conditions.   Objective:   Blood pressure 113/70, pulse 79, temperature 98.2 F (36.8 C), height 5\' 6"  (1.676 m), weight 166 lb (75.3 kg), last menstrual period 02/25/1995, SpO2 95 %. Body mass index is 26.79 kg/m.  General: Cooperative, alert, well developed, in no acute distress. HEENT: Conjunctivae and lids unremarkable. Cardiovascular: Regular rhythm.  Lungs: Normal work of breathing. Neurologic: No focal deficits.   Lab Results  Component Value Date   CREATININE 0.55 (L) 08/22/2020   BUN 11 08/22/2020   NA 142 08/22/2020   K 4.7 08/22/2020   CL 101 08/22/2020   CO2 24 08/22/2020   Lab Results  Component Value Date   ALT 20 01/09/2021   AST 19 01/09/2021   ALKPHOS 91 08/22/2020   BILITOT 0.4 08/22/2020   Lab Results  Component Value Date   HGBA1C 5.3 01/09/2021   HGBA1C 5.8 (H) 08/22/2020   HGBA1C 5.5 08/27/2015   Lab Results  Component Value Date   INSULIN 6.9 08/22/2020   Lab Results  Component Value Date   TSH 0.01 (A) 01/09/2021   Lab Results  Component Value Date   CHOL 149 01/09/2021   HDL 46 01/09/2021   LDLCALC 89 01/09/2021   TRIG 69 01/09/2021   CHOLHDL 3.9 02/11/2017   Lab Results  Component Value Date   VD25OH 39.9 01/09/2021   VD25OH 30.4 08/22/2020   VD25OH 28 (L) 02/11/2017   Lab Results  Component Value Date   WBC 6.5 08/22/2020   HGB 14.9 08/22/2020  HCT 45.4 08/22/2020   MCV 87 08/22/2020   PLT 222 02/11/2017   No results found for: IRON, TIBC, FERRITIN  Attestation Statements:   Reviewed by clinician on day of visit: allergies, medications, problem list, medical history, surgical history, family history, social history, and previous encounter notes.  Time spent on visit including pre-visit chart review and post-visit care and charting was 30 minutes.    I, Trixie Dredge, am acting as transcriptionist for Dennard Nip, MD.  I have reviewed the above documentation for accuracy  and completeness, and I agree with the above. -  Dennard Nip, MD

## 2021-03-04 ENCOUNTER — Ambulatory Visit
Admission: RE | Admit: 2021-03-04 | Discharge: 2021-03-04 | Disposition: A | Discharge status: Home / Self Care | Payer: No Typology Code available for payment source | Source: Ambulatory Visit | Attending: Endocrinology | Admitting: Endocrinology

## 2021-03-04 DIAGNOSIS — E785 Hyperlipidemia, unspecified: Secondary | ICD-10-CM

## 2021-03-04 DIAGNOSIS — Z0000 Encounter for general adult medical examination without abnormal findings: Secondary | ICD-10-CM

## 2021-03-18 ENCOUNTER — Encounter (INDEPENDENT_AMBULATORY_CARE_PROVIDER_SITE_OTHER): Payer: Self-pay | Admitting: Family Medicine

## 2021-03-18 ENCOUNTER — Ambulatory Visit (INDEPENDENT_AMBULATORY_CARE_PROVIDER_SITE_OTHER): Payer: Medicare HMO | Admitting: Family Medicine

## 2021-03-18 ENCOUNTER — Other Ambulatory Visit: Payer: Self-pay

## 2021-03-18 VITALS — BP 127/76 | HR 80 | Temp 98.0°F | Ht 66.0 in | Wt 166.0 lb

## 2021-03-18 DIAGNOSIS — F3289 Other specified depressive episodes: Secondary | ICD-10-CM

## 2021-03-18 DIAGNOSIS — E669 Obesity, unspecified: Secondary | ICD-10-CM

## 2021-03-18 DIAGNOSIS — Z6826 Body mass index (BMI) 26.0-26.9, adult: Secondary | ICD-10-CM

## 2021-03-18 DIAGNOSIS — F32A Depression, unspecified: Secondary | ICD-10-CM | POA: Insufficient documentation

## 2021-03-18 NOTE — Progress Notes (Signed)
Chief Complaint:   OBESITY Shatiqua is here to discuss her progress with her obesity treatment plan along with follow-up of her obesity related diagnoses. Sharlette is on the Category 2 Plan and states she is following her eating plan approximately 65-70% of the time. Cynde states she is doing water aerobics for 60 minutes 5 times per week.  Today's visit was #: 10 Starting weight: 188 lbs Starting date: 08/22/2020 Today's weight: 166 lbs Today's date: 03/18/2021 Total lbs lost to date: 22 Total lbs lost since last in-office visit: 0  Interim History: Tniyah has done well with maintaining her weight even over the holidays. She notes increased emotional eating behaviors recently, worse in the PM.  Subjective:   1. Other depression, with emotional eating [place]igh notes increased emotional eating behaviors, worse in the last few weeks and worse in the PM. She feels out of control at times.  Assessment/Plan:   1. Other depression, with emotional eating Shekina was educated on emotional eating behaviors, and mechanism of how likely this can happen as well as behavioral strategies to help her minimize this, and we will follow closely. Orders and follow up as documented in patient record.   2. Obesity, current BMI 26.9 Fallon is currently in the action stage of change. As such, her goal is to continue with weight loss efforts. She has agreed to the Category 2 Plan.   Exercise goals: As is.  Behavioral modification strategies: ways to avoid boredom eating and ways to avoid night time snacking.  Kelyse has agreed to follow-up with our clinic in 2 weeks. She was informed of the importance of frequent follow-up visits to maximize her success with intensive lifestyle modifications for her multiple health conditions.   Objective:   Blood pressure 127/76, pulse 80, temperature 98 F (36.7 C), height 5\' 6"  (1.676 m), weight 166 lb (75.3 kg), last menstrual period 02/25/1995, SpO2 97 %. Body mass index is 26.79  kg/m.  General: Cooperative, alert, well developed, in no acute distress. HEENT: Conjunctivae and lids unremarkable. Cardiovascular: Regular rhythm.  Lungs: Normal work of breathing. Neurologic: No focal deficits.   Lab Results  Component Value Date   CREATININE 0.55 (L) 08/22/2020   BUN 11 08/22/2020   NA 142 08/22/2020   K 4.7 08/22/2020   CL 101 08/22/2020   CO2 24 08/22/2020   Lab Results  Component Value Date   ALT 20 01/09/2021   AST 19 01/09/2021   ALKPHOS 91 08/22/2020   BILITOT 0.4 08/22/2020   Lab Results  Component Value Date   HGBA1C 5.3 01/09/2021   HGBA1C 5.8 (H) 08/22/2020   HGBA1C 5.5 08/27/2015   Lab Results  Component Value Date   INSULIN 6.9 08/22/2020   Lab Results  Component Value Date   TSH 0.01 (A) 01/09/2021   Lab Results  Component Value Date   CHOL 149 01/09/2021   HDL 46 01/09/2021   LDLCALC 89 01/09/2021   TRIG 69 01/09/2021   CHOLHDL 3.9 02/11/2017   Lab Results  Component Value Date   VD25OH 39.9 01/09/2021   VD25OH 30.4 08/22/2020   VD25OH 28 (L) 02/11/2017   Lab Results  Component Value Date   WBC 6.5 08/22/2020   HGB 14.9 08/22/2020   HCT 45.4 08/22/2020   MCV 87 08/22/2020   PLT 222 02/11/2017   No results found for: IRON, TIBC, FERRITIN  Attestation Statements:   Reviewed by clinician on day of visit: allergies, medications, problem list, medical history, surgical  history, family history, social history, and previous encounter notes.  Time spent on visit including pre-visit chart review and post-visit care and charting was 32 minutes.   I, Trixie Dredge, am acting as transcriptionist for Dennard Nip, MD.  I have reviewed the above documentation for accuracy and completeness, and I agree with the above. -  Dennard Nip, MD

## 2021-03-19 ENCOUNTER — Encounter: Payer: Self-pay | Admitting: Neurology

## 2021-03-19 ENCOUNTER — Ambulatory Visit (INDEPENDENT_AMBULATORY_CARE_PROVIDER_SITE_OTHER): Payer: Medicare HMO | Admitting: Neurology

## 2021-03-19 VITALS — BP 102/64 | HR 79 | Ht 66.0 in | Wt 172.0 lb

## 2021-03-19 DIAGNOSIS — G4733 Obstructive sleep apnea (adult) (pediatric): Secondary | ICD-10-CM

## 2021-03-19 DIAGNOSIS — G478 Other sleep disorders: Secondary | ICD-10-CM | POA: Diagnosis not present

## 2021-03-19 DIAGNOSIS — M542 Cervicalgia: Secondary | ICD-10-CM

## 2021-03-19 DIAGNOSIS — Z9989 Dependence on other enabling machines and devices: Secondary | ICD-10-CM

## 2021-03-19 MED ORDER — LORAZEPAM 1 MG PO TABS: 1.0000 mg | ORAL_TABLET | Freq: Two times a day (BID) | ORAL | 1 refills | Status: AC

## 2021-03-19 NOTE — Patient Instructions (Signed)

## 2021-03-19 NOTE — Progress Notes (Signed)
SLEEP MEDICINE CLINIC   Provider:  Larey Green, M D  Primary Care Physician:  Brenda Bowen, MD   Referring Provider: Reynold Bowen, MD   Chief Complaint  Patient presents with   Follow-up    pt alone, rm 11. pt states that machine is doing well. she uses it nightly.  DME [facility]. she had covid in Jan 2021 and her husband contracted COVId again while on [place] 9-23- through 10-15 -22      09-18-2020, RV for  [name] is a 81 y.o. 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 She had a COVID infection in January 2021 and has had headaches since. [name] was prescribed by Dr [name] and has helped greatly.  Some weeks are headache free and others have 2-3 migraines. She has not been on preventives-  she has thoughts about a dental device, but feels that CPAP has served her well.  She has worsening hearing loss.          2021   The headaches are associated with photophobia and nausea.  She has been told her cervical disc degeneration is at cause.  She did not respond to trigger point injection. She felt more sore- bee-sting- like Lost her sister in law to [place] , just this month.  She has been on CPAP for UARS, OSA . She is doing better on Doxepin and Toradol and her neck hurt less.  Uses Lunesta - per Dr [name]. [name] did blood work ay [employer] and wellness.  CPAP compliance: [name] has used her machine 93% of days 28 out of 30 days with an average use at time of 8 hours and 30 minutes.  She is using an AutoSet between 5 and 11 cmH2O was 2 cm expiratory pressure relief.  Residual AHI is 3.0, she has few central apneas emerging.  Her CPAP is now 7.81 years old she also has a travel CPAP which recently broke I will write today for a new travel CPAP prescription for the CPAP to be set to 10 cmH2O was 2 cm EPR the mask that she is using for her regular CPAP should be duplicated there.   The Epworth sleepiness score was  endorsed at 2 points out of 24 which is excellent fatigue severity was not endorsed today and geriatric depression score was 6 out of 15 points and she is followed by Dr. Casimiro Green for depression.   06-15-2019:  She was told by Brenda Blower, MD, to treat these aggressively - Doxepin at 100 mg at night- but she still has headaches.  When I encountered [name] last in October 2020 she had actually been headache free for a while after starting on doxepin at a lower dose.  She feels well exhausted, feels a complete lack of energy.  Outdoor activities just walking outdoors also are aggravating her allergies.  She has been a compliant CPAP user as usual with over 90% compliance and average use at time of 8 hours and 40 minutes with a minimum pressure of 5, a maximum pressure setting of 11 cmH2O and 2 cm EPR and a residual AHI of only 1.2.  Her air leaks are low and her pressure at the 91st percentile is 9.5.  There needs to be no adjustment done for the CPAP part.  Her fatigue has exacerbated she endorsed a fatigue score of 41, but she is not excessively daytime sleepy.  She reports loss of taste and smell.  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  She endorsed depression of 4 out of 15 - so not a clinical level of concern.    Interval 11/2018 [name] reports improvement in headaches after Dr. Oneida Alar has started her on doxepin. She sleeps well, her headaches are gone. CPAP : She went to [place] and used machine every single night.  EPWORTH 1. [name] has used her machine daily for the last 30 days including 08 December 2018, average use at time 8 hours 58 minutes, her minimum pressure setting is for maximum pressure setting 10 cmH2O this 2 cm expiratory pressure relief and a resulting AHI of 2.5 there is no air leak.  The resulting AHI is mostly related to obstructive apneas.  The 95th percentile pressure is at 9.2 cmH2O, which makes me think that we need to increase her maximum pressure setting x1 cm  the new window will be for up to 11 cmH2O is 2 cm EPR.    08-11-2018:[name] is a 81 y.o. female , seen here for a new concern about a recent fall- retropusive, hit her head on a paved brick pavement, all this happened on June 6th at her husband's high school reunion in [place], [place].  Patient reports that she initially felt not as much pain, she has spit up by herself went to her bedroom and rested, but after 2 or 3 days she developed a headache associated with some sickness to her stomach and some photophobia but must be migrainous in origin.  She did not break any bones, her pupils are of similar size, and she does not have a focal weakness or sensory abnormality. She hasn't had a migraine for almost one year.  She is not dizzy- but walks with sunglasses.  She needs some intervention  for her migraine and a CT of the had to rule out any bleed.   04-15-2017, I have the pleasure of meeting with [name] today, for a regular CPAP CPAP compliance visit.  The patient now uses an AutoSet between 4 and 10 cmH2O pressure was 1 cm EPR, she has achieved a compliance of 100% with an average use at time of 9 hours and 10 minutes each night.  Her residual AHI is 1.5/h, which is a very good resolution.  95th percentile pressure is 9.7 cm water air leaks are medium. Epworth 2, FSS 41.  She loves the new CPAP auto- machine.  Here is the result of her sleep study:   NAME:  [name]                                                      DOB: March 06, 1940 MEDICAL RECORD NUMBER [id number]                                 DOS: 01/31/2017 REFERRING PHYSICIAN: Hollace Green, D.O. BMI: 30.6  STUDY RESULTS: Total Recording Time: 10 hours, 51 minutes,  Total Apnea/Hypopnea Index (AHI):   8.2/ hr. RDI 10.5 /hr.  Average Oxygen Saturation:    92 % Lowest Oxygen Desaturation:  82 %; Total Time Oxygen Saturation below 89%:  19 minutes Average Heart Rate:   64 bpm (40-161 bpm).     Social History   Socioeconomic  History  Marital status: Married    Spouse name: [name]   Number of children: 0   Years of education: 14   Highest education level: Not on file  Occupational History   Occupation: Retired Catering manager  Tobacco Use   Smoking status: Never    Passive exposure: Yes   Smokeless tobacco: Never   Tobacco comments:    flight attendant on smoking plane   Vaping Use   Vaping Use: Never used  Substance and Sexual Activity   Alcohol use: Yes    Alcohol/week: 0.0 standard drinks    Comment: 1 glass - occas of vodka   Drug use: No   Sexual activity: Not Currently    Partners: Male    Birth control/protection: Post-menopausal  Other Topics Concern   Not on file  Social History Narrative   Patient is married [name]).   Patient drinks 2-4 cups of coffee daily.   Patient is retired.   Patient has two years of college.   Patient is right-handed.               Social Determinants of Health   Financial Resource Strain: Not on file  Food Insecurity: Not on file  Transportation Needs: Not on file  Physical Activity: Not on file  Stress: Not on file  Social Connections: Not on file  Intimate Partner Violence: Not on file     Vitals: BP 102/64    Pulse 79    Ht 5\' 6"  (1.676 m)    Wt 172 lb (78 kg)    LMP 02/25/1995 (Approximate)    SpO2 97%    BMI 27.76 kg/m  Last Weight:  Wt Readings from Last 1 Encounters:  03/19/21 172 lb (78 kg)   ZOX:WRUE mass index is 27.76 kg/m.     Last Height:   Ht Readings from Last 1 Encounters:  03/19/21 5\' 6"  (1.676 m)    Physical exam: The patient feels hot to the touch and is flushed. There is a tremor in both hands.   General: The patient is awake, alert and appears not in acute distress. The patient is well groomed. Head: Normocephalic, atraumatic. Neck is supple. Mallampati 4  neck circumference:15.5 . Nasal airflow patent.  Cardiovascular:  Regular rate and rhythm , without  murmurs or carotid bruit, and without distended neck  veins. Respiratory: Lungs are clear to auscultation. Skin:  Without evidence of edema, or rash Trunk: BMI is  30.18- .   Neurologic exam : The patient is awake and alert, oriented to place and time.   MOCA:No flowsheet data found. MMSE: MMSE - Mini Mental State Exam 10/28/2017 09/23/2016 08/31/2015  Orientation to time 5 5 4   Orientation to Place 5 5 5   Registration 3 3 3   Attention/ Calculation 5 5 4   Recall 2 2 3   Language- name 2 objects 2 2 2   Language- repeat 1 1 1   Language- follow 3 step command 3 3 1   Language- read & follow direction 1 1 1   Write a sentence 1 1 1   Copy design 1 1 1   Total score 29 29 26       Attention span & concentration ability appears normal.  Speech is fluent, Mood and affect are appropriate.  Cranial nerves: Pupils are equal and briskly reactive to light. Funduscopic exam without evidence of pallor or edema. Extraocular movements  in vertical and horizontal planes intact and without nystagmus. Visual fields by finger perimetry are intact. Hearing corrected with hearing  aids.  Facial sensation intact to fine touch. Facial motor strength is symmetric and tongue and uvula move midline. Shoulder shrug was asymmetrical.   Neck and shoulders are stiff.- extremely tense. Tension headaches, neck pain-   Motor exam:  Normal tone, muscle bulk and symmetric strength in all extremities. Sensory:  Fine touch, pinprick and vibration were tested in all extremities. Proprioception tested in the upper extremities was normal. Coordination: Rapid alternating movements in the fingers/hands was normal.  Finger-to-nose maneuver  normal without evidence of ataxia, dysmetria or tremor. Gait and station: Patient walks without assistive device . Deep tendon reflexes: in the  upper and lower extremities are symmetric and intact. I avoided the patella.  Babinski maneuver response is downgoing.   Assessment:  After physical and neurologic examination, review of laboratory  studies,  Personal review of imaging studies, reports of other /same  Imaging studies, results of polysomnography and / or neurophysiology testing and pre-existing records as far as provided in visit;   OSA / UARS: Compliance to CPAP recorded:  I reviewed the patient's 30-day compliance she has used her machine 100% of 30 out of 30 days and 29 of those days for over 4 hours consecutively.  Her AutoSet offers a range between 5 and 11 cmH2O pressure was 2 cm water EPR.  95th percentile pressure used is 9.7 cmH2O there are no major air leaks and there were no central apneas arising.  Residual AHI is 2.4/h.  Set up for the machine was 12-6 2018 which means that she will be eligible for new machine at the end of this year.  At the end at the end of this year 2023 she will return for her for a home sleep test and based on the results on the results we will offer her on the results we will offer her a new machine which likely will again be an autotitrator with similar settings similar settings.  She does like her current mask.     IMPRESSION:  0) OSA  At the end at the end of this year 2023 she will return for her for a home sleep test and based on the results on the results we will offer her on the results we will offer her a new machine which likely will again be an autotitrator with similar settings similar settings.  She does like her current mask.    1) Migraine: Dr [name] has prescribed UBRELVY to which she responded well. Has to keep a headache diary and we may add Ajovy.  Severe neck tension and known DDD, restricted ROM for retroflexion/ flexion at the neck .  2) Mild sleep apnea with snoring, desaturation and heart rate variability =baseline  AHI 8.2/hr .UARS was evident.  Continued CPAP therapy is indicated.  She will need a new travel CPAP. Set to 10 cm water 2 cm EPR.  I ordered  Auto CPAP 4-10 cm water, 3 cm EPR, heated humidity and fit for dream wear interface.    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   I spent more than 28 minutes of face to face time with the patient. Greater than 50% of time was spent in counseling and coordination of care. We have discussed the diagnosis and differential and I answered the patient's questions.    Plan:  Treatment plan and additional workup : continue using CPAP with the current settings, RV in 6 month.    Brenda Seat, MD   08/13/3557, 7:41 PM  Certified in  Neurology by ABPN Certified in [place] by [facility] [address], [place], [place] [address]

## 2021-03-28 ENCOUNTER — Other Ambulatory Visit (INDEPENDENT_AMBULATORY_CARE_PROVIDER_SITE_OTHER): Payer: Self-pay | Admitting: Family Medicine

## 2021-03-28 DIAGNOSIS — E559 Vitamin D deficiency, unspecified: Secondary | ICD-10-CM

## 2021-04-01 NOTE — Telephone Encounter (Signed)
LAST APPOINTMENT DATE: 03/18/21 NEXT APPOINTMENT DATE: 04/08/21   [id number] - [place] [place] [address] [place] [address] Phone: [phone] Fax: [phone]  Patient is requesting a refill of the following medications: Requested Prescriptions   Pending Prescriptions Disp Refills   Vitamin D, Ergocalciferol, (DRISDOL) 1.25 MG (50000 UNIT) CAPS capsule [Pharmacy Med Name: VITAMIN D2 1.25MG (50,000 UNIT)] 12 capsule 0    Sig: TAKE 1 CAPSULE (50,000 UNITS TOTAL) BY MOUTH EVERY 7 (SEVEN) DAYS    Date last filled: 02/12/21 Previously prescribed by Dr. Leafy Ro  Lab Results  Component Value Date   HGBA1C 5.3 01/09/2021   HGBA1C 5.8 (H) 08/22/2020   HGBA1C 5.5 08/27/2015   Lab Results  Component Value Date   LDLCALC 89 01/09/2021   CREATININE 0.55 (L) 08/22/2020   Lab Results  Component Value Date   VD25OH 39.9 01/09/2021   VD25OH 30.4 08/22/2020   VD25OH 28 (L) 02/11/2017    BP Readings from Last 3 Encounters:  03/19/21 102/64  03/18/21 127/76  02/12/21 113/70

## 2021-04-08 ENCOUNTER — Encounter (INDEPENDENT_AMBULATORY_CARE_PROVIDER_SITE_OTHER): Payer: Self-pay | Admitting: Family Medicine

## 2021-04-08 ENCOUNTER — Other Ambulatory Visit: Payer: Self-pay

## 2021-04-08 ENCOUNTER — Ambulatory Visit (INDEPENDENT_AMBULATORY_CARE_PROVIDER_SITE_OTHER): Payer: Medicare HMO | Admitting: Family Medicine

## 2021-04-08 VITALS — BP 123/82 | HR 78 | Temp 98.2°F | Ht 66.0 in | Wt 163.0 lb

## 2021-04-08 DIAGNOSIS — Z6830 Body mass index (BMI) 30.0-30.9, adult: Secondary | ICD-10-CM

## 2021-04-08 DIAGNOSIS — E669 Obesity, unspecified: Secondary | ICD-10-CM | POA: Diagnosis not present

## 2021-04-08 DIAGNOSIS — K5909 Other constipation: Secondary | ICD-10-CM

## 2021-04-08 DIAGNOSIS — Z6826 Body mass index (BMI) 26.0-26.9, adult: Secondary | ICD-10-CM | POA: Diagnosis not present

## 2021-04-08 DIAGNOSIS — G43809 Other migraine, not intractable, without status migrainosus: Secondary | ICD-10-CM

## 2021-04-09 ENCOUNTER — Other Ambulatory Visit (INDEPENDENT_AMBULATORY_CARE_PROVIDER_SITE_OTHER): Payer: Self-pay | Admitting: Family Medicine

## 2021-04-09 ENCOUNTER — Other Ambulatory Visit: Payer: Self-pay | Admitting: Sports Medicine

## 2021-04-09 DIAGNOSIS — E559 Vitamin D deficiency, unspecified: Secondary | ICD-10-CM

## 2021-04-09 NOTE — Telephone Encounter (Signed)
Dr.Beasley 

## 2021-04-09 NOTE — Progress Notes (Signed)
Chief Complaint:   OBESITY [name] is here to discuss her progress with her obesity treatment plan along with follow-up of her obesity related diagnoses. [name] is on the Category 2 Plan and states she is following her eating plan approximately 75% of the time. [name] states she is doing water exercise for 60 minutes 5 times per week.  Today's visit was #: 11 Starting weight: 188 lbs Starting date: 08/22/2020 Today's weight: 163 lbs Today's date: 04/08/2021 Total lbs lost to date: 25 Total lbs lost since last in-office visit: 3  Interim History: Kinze continues to do well with weight loss despite having extra challenges over the [employer]. She is getting good support from her husband. She notes her knee pain has improved with weight loss.  Subjective:   1. Other constipation [name] is on Tramadol and she notes constipation has not improved with miralax.  2. Other migraine without status migrainosus, not intractable [name] is still having chronic migraine headaches, and she has not tried Botox and may be interested.  Assessment/Plan:   1. Other constipation [name] will continue to increase her water intake and miralax. She will work on increasing fiber and follow up. If no improvement she may need to see GI. She was informed that a decrease in bowel movement frequency is normal while losing weight, but stools should not be hard or painful. Orders and follow up as documented in patient record.   2. Other migraine without status migrainosus, not intractable We will refill to Dr. Jaynee Eagles for evaluation and treatment.  3. Obesity, current BMI 26.4 [name] is currently in the action stage of change. As such, her goal is to continue with weight loss efforts. She has agreed to the Category 2 Plan.   Exercise goals: As is.  Behavioral modification strategies: increasing water intake and increasing high fiber foods.  [name] has agreed to follow-up with our clinic in 3 to 4 weeks. She was informed of  the importance of frequent follow-up visits to maximize her success with intensive lifestyle modifications for her multiple health conditions.   Objective:   Blood pressure 123/82, pulse 78, temperature 98.2 F (36.8 C), height 5\' 6"  (1.676 m), weight 163 lb (73.9 kg), last menstrual period 02/25/1995, SpO2 96 %. Body mass index is 26.31 kg/m.  General: Cooperative, alert, well developed, in no acute distress. HEENT: Conjunctivae and lids unremarkable. Cardiovascular: Regular rhythm.  Lungs: Normal work of breathing. Neurologic: No focal deficits.   Lab Results  Component Value Date   CREATININE 0.55 (L) 08/22/2020   BUN 11 08/22/2020   NA 142 08/22/2020   K 4.7 08/22/2020   CL 101 08/22/2020   CO2 24 08/22/2020   Lab Results  Component Value Date   ALT 20 01/09/2021   AST 19 01/09/2021   ALKPHOS 91 08/22/2020   BILITOT 0.4 08/22/2020   Lab Results  Component Value Date   HGBA1C 5.3 01/09/2021   HGBA1C 5.8 (H) 08/22/2020   HGBA1C 5.5 08/27/2015   Lab Results  Component Value Date   INSULIN 6.9 08/22/2020   Lab Results  Component Value Date   TSH 0.01 (A) 01/09/2021   Lab Results  Component Value Date   CHOL 149 01/09/2021   HDL 46 01/09/2021   LDLCALC 89 01/09/2021   TRIG 69 01/09/2021   CHOLHDL 3.9 02/11/2017   Lab Results  Component Value Date   VD25OH 39.9 01/09/2021   VD25OH 30.4 08/22/2020   VD25OH 28 (L) 02/11/2017   Lab  Results  Component Value Date   WBC 6.5 08/22/2020   HGB 14.9 08/22/2020   HCT 45.4 08/22/2020   MCV 87 08/22/2020   PLT 222 02/11/2017   No results found for: IRON, TIBC, FERRITIN  Obesity Behavioral Intervention:   Approximately 15 minutes were spent on the discussion below.  ASK: We discussed the diagnosis of obesity with [name] today and [name] agreed to give [place] permission to discuss obesity behavioral modification therapy today.  ASSESS: [name] has the diagnosis of obesity and her BMI today is 26.4. [name] is in the  action stage of change.   ADVISE: [name] was educated on the multiple health risks of obesity as well as the benefit of weight loss to improve her health. She was advised of the need for long term treatment and the importance of lifestyle modifications to improve her current health and to decrease her risk of future health problems.  AGREE: Multiple dietary modification options and treatment options were discussed and [name] agreed to follow the recommendations documented in the above note.  ARRANGE: [name] was educated on the importance of frequent visits to treat obesity as outlined per CMS and USPSTF guidelines and agreed to schedule her next follow up appointment today.  Attestation Statements:   Reviewed by clinician on day of visit: allergies, medications, problem list, medical history, surgical history, family history, social history, and previous encounter notes.   I, Trixie Dredge, am acting as transcriptionist for Maryjean Morn, MD.  I have reviewed the above documentation for accuracy and completeness, and I agree with the above. -  Dennard Nip, MD

## 2021-04-23 ENCOUNTER — Ambulatory Visit (INDEPENDENT_AMBULATORY_CARE_PROVIDER_SITE_OTHER): Payer: Medicare HMO | Admitting: Adult Health

## 2021-04-23 ENCOUNTER — Encounter (INDEPENDENT_AMBULATORY_CARE_PROVIDER_SITE_OTHER): Payer: Self-pay | Admitting: Adult Health

## 2021-04-23 ENCOUNTER — Other Ambulatory Visit: Payer: Self-pay

## 2021-04-23 VITALS — BP 108/66 | HR 79 | Temp 98.0°F | Ht 66.0 in | Wt 163.0 lb

## 2021-04-23 DIAGNOSIS — E669 Obesity, unspecified: Secondary | ICD-10-CM | POA: Insufficient documentation

## 2021-04-23 DIAGNOSIS — G43809 Other migraine, not intractable, without status migrainosus: Secondary | ICD-10-CM | POA: Diagnosis not present

## 2021-04-23 DIAGNOSIS — Z6830 Body mass index (BMI) 30.0-30.9, adult: Secondary | ICD-10-CM | POA: Insufficient documentation

## 2021-04-23 DIAGNOSIS — Z6826 Body mass index (BMI) 26.0-26.9, adult: Secondary | ICD-10-CM | POA: Diagnosis not present

## 2021-04-23 DIAGNOSIS — E559 Vitamin D deficiency, unspecified: Secondary | ICD-10-CM | POA: Diagnosis not present

## 2021-04-23 DIAGNOSIS — G43909 Migraine, unspecified, not intractable, without status migrainosus: Secondary | ICD-10-CM | POA: Insufficient documentation

## 2021-04-23 MED ORDER — VITAMIN D (ERGOCALCIFEROL) 1.25 MG (50000 UNIT) PO CAPS: 50000.0000 [IU] | ORAL_CAPSULE | ORAL | 0 refills | Status: DC

## 2021-04-23 NOTE — Progress Notes (Signed)
Chief Complaint:   OBESITY [name] is here to discuss her progress with her obesity treatment plan along with follow-up of her obesity related diagnoses. [name] is on the Category 2 Plan and states she is following her eating plan approximately 80% of the time. [name] states she is walking/water aerobics for 60-90 minutes 5 times per week.  Today's visit was #: 12 Starting weight: 188 lbs Starting date: 08/22/2020 Today's weight: 163 lbs Today's date: 04/23/2021 Total lbs lost to date: 25 lbs Total lbs lost since last in-office visit: 0  Interim History: [name] endorses increased polyphagia in late evening just prior to bedtime, especially for sweets - last 4 weeks. Breakfast - omelette, coffee with half/half, 2 slices of 45 calorie bread. Lunch - 4 oz [place], low fat cheese, 2 slices of 45 calorie bread. Dinner - 6-8 oz beef, 2 cups of green vegetables.  She will eat "Off Plan" when eating out 2-3 times per week for dinner.  Subjective:   1. Vitamin D deficiency On 01/09/2021, vitamin D level - 39.9. She is on ergocalciferol 50,000 IU once weekly, denies N/V/muscle weakness.  2. Other migraine without status migrainosus, not intractable Recently referred to Dr. Ahern/Neurology for Botox treatments for migraine HA, She declines referral at this time.  Assessment/Plan:   1. Vitamin D deficiency Refill ergocalciferol 50,000 IU once weekly.  2. Other migraine without status migrainosus, not intractable Increase water. OTC Migrelief- per manufacturer's instructions.  3. Obesity, current BMI 26.4 [name] is currently in the action stage of change. As such, her goal is to continue with weight loss efforts. She has agreed to the Category 2 Plan and keeping a food journal and adhering to recommended goals of 400-500 calories and 35 protein at supper.  Can add in 200 snack calories at dinner, if you have saved them throughout the day.  Exercise goals:  As is.  Behavioral modification  strategies: increasing lean protein intake, decreasing simple carbohydrates, meal planning and cooking strategies, keeping healthy foods in the home, and planning for success.  [name] has agreed to follow-up with our clinic in 2 weeks, fasting. She was informed of the importance of frequent follow-up visits to maximize her success with intensive lifestyle modifications for her multiple health conditions.   Objective:   Blood pressure 108/66, pulse 79, temperature 98 F (36.7 C), height 5\' 6"  (1.676 m), weight 163 lb (73.9 kg), last menstrual period 02/25/1995, SpO2 96 %. Body mass index is 26.31 kg/m.  General: Cooperative, alert, well developed, in no acute distress. HEENT: Conjunctivae and lids unremarkable. Cardiovascular: Regular rhythm.  Lungs: Normal work of breathing. Neurologic: No focal deficits.   Lab Results  Component Value Date   CREATININE 0.55 (L) 08/22/2020   BUN 11 08/22/2020   NA 142 08/22/2020   K 4.7 08/22/2020   CL 101 08/22/2020   CO2 24 08/22/2020   Lab Results  Component Value Date   ALT 20 01/09/2021   AST 19 01/09/2021   ALKPHOS 91 08/22/2020   BILITOT 0.4 08/22/2020   Lab Results  Component Value Date   HGBA1C 5.3 01/09/2021   HGBA1C 5.8 (H) 08/22/2020   HGBA1C 5.5 08/27/2015   Lab Results  Component Value Date   INSULIN 6.9 08/22/2020   Lab Results  Component Value Date   TSH 0.01 (A) 01/09/2021   Lab Results  Component Value Date   CHOL 149 01/09/2021   HDL 46 01/09/2021   LDLCALC 89 01/09/2021   TRIG 69 01/09/2021  CHOLHDL 3.9 02/11/2017   Lab Results  Component Value Date   VD25OH 39.9 01/09/2021   VD25OH 30.4 08/22/2020   VD25OH 28 (L) 02/11/2017   Lab Results  Component Value Date   WBC 6.5 08/22/2020   HGB 14.9 08/22/2020   HCT 45.4 08/22/2020   MCV 87 08/22/2020   PLT 222 02/11/2017   Attestation Statements:   Reviewed by clinician on day of visit: allergies, medications, problem list, medical history, surgical  history, family history, social history, and previous encounter notes.  Time spent on visit including pre-visit chart review and post-visit care and charting was 28 minutes.   I, Water quality scientist, CMA, am acting as Location manager for Mina Marble, NP.  I have reviewed the above documentation for accuracy and completeness, and I agree with the above. -  Cecile Gillispie d. Jartavious Mckimmy, NP-C

## 2021-05-02 ENCOUNTER — Ambulatory Visit: Payer: Medicare HMO | Admitting: Sports Medicine

## 2021-05-07 ENCOUNTER — Ambulatory Visit (INDEPENDENT_AMBULATORY_CARE_PROVIDER_SITE_OTHER): Payer: Medicare HMO | Admitting: Family Medicine

## 2021-05-09 ENCOUNTER — Ambulatory Visit (INDEPENDENT_AMBULATORY_CARE_PROVIDER_SITE_OTHER): Payer: Medicare HMO | Admitting: Sports Medicine

## 2021-05-09 DIAGNOSIS — M542 Cervicalgia: Secondary | ICD-10-CM

## 2021-05-09 DIAGNOSIS — M5030 Other cervical disc degeneration, unspecified cervical region: Secondary | ICD-10-CM | POA: Diagnosis not present

## 2021-05-09 DIAGNOSIS — M47812 Spondylosis without myelopathy or radiculopathy, cervical region: Secondary | ICD-10-CM | POA: Diagnosis not present

## 2021-05-09 MED ORDER — METHOCARBAMOL 500 MG PO TABS: 500.0000 mg | ORAL_TABLET | Freq: Four times a day (QID) | ORAL | 2 refills | Status: DC

## 2021-05-09 NOTE — Patient Instructions (Addendum)
Take Robaxin 2 to  times a day depending on the pain in your neck.... ? ?If you are having any pain, please reduce the frequency ? ?Treatment for 2-3 weeks ? ?F/u in one month ?

## 2021-05-09 NOTE — Assessment & Plan Note (Signed)
She is having an acute flare with more trap spasm ?Will add Robaxin 500 bid to qid ?Continue her tramadol and doxepin ?Reck in 1 month ? ?Cut exercises and go with easy motion of neck ?

## 2021-05-09 NOTE — Progress Notes (Signed)
CC: follow up of chronic neck pain and headaches ? ?Patient has been doing well ?Over past week though has neck tightness ?This is new and starting to trigger a headache ?She has been trying to exercise more ?She does isometric exercises but these are starting to be painful ?Neck motion is feeling tighter ? ?ROS ?No tingling into hands ?No radicular sxs ? ?[name] in some discomfort today ?BP 105/67   Ht '5\' 6"'$  (1.676 m)   Wt 165 lb (74.8 kg)   LMP 02/25/1995 (Approximate)   BMI 26.63 kg/m?  ? ?Neck motion - limited in lateral bending and in rotation ?Flexion is full ?Extension limited with pain ? ?Trapezius spasm is noted bilaterally ? ?C5 to T1 without deficits ? ?

## 2021-05-10 MED ORDER — DOXEPIN HCL 50 MG PO CAPS: ORAL_CAPSULE | ORAL | 1 refills | Status: DC

## 2021-05-10 MED ORDER — DOXEPIN HCL 100 MG PO CAPS: 100.0000 mg | ORAL_CAPSULE | Freq: Every day | ORAL | 1 refills | Status: DC

## 2021-05-10 MED ORDER — TRAMADOL HCL 50 MG PO TABS: 50.0000 mg | ORAL_TABLET | Freq: Three times a day (TID) | ORAL | 4 refills | Status: DC

## 2021-05-13 ENCOUNTER — Ambulatory Visit: Payer: Self-pay | Admitting: Obstetrics and Gynecology

## 2021-05-14 ENCOUNTER — Other Ambulatory Visit: Payer: Self-pay | Admitting: Obstetrics and Gynecology

## 2021-05-14 DIAGNOSIS — Z1231 Encounter for screening mammogram for malignant neoplasm of breast: Secondary | ICD-10-CM

## 2021-05-20 NOTE — Progress Notes (Signed)
81 y.o. G0P0 Married White or Caucasian Not Hispanic or Latino female here for breast and pelvic exam.  She is having some vaginal pain off and on. She states that it is a sharp stabbing pain. This pain has been occurring off an on for years. No rhyme or reason, just random. It can occur 2-3 x a month, then nothing for months. The pain can be 13 seconds or several minutes. The pain is severe and she can't move when she has the pain. Not getting better, it is more frequent in the last year.  ?The pain feels like it is right in her vagina. ?Not sexually active for years. She did have pain with sex, but it was different.  ?No vaginal d/c, no itching, burning or irritation, no odor.  ? ?She is also having urinary frequency. Voids 15-20 x a day, normal amounts. No urgency to void, no dysuria. She drinks ~24 oz of coffee a day.   ?01/09/21 HgbA1C was 5.3% ? ?Mild mixed incontinence.  ?  ?Long term issues with constipation. Takes miralax nightly, takes a pill. Still struggles. On long term tramadol. ? 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 Doesn't need a valtrex refill.  ? ?Patient's last menstrual period was 02/25/1995 (approximate).          ?Sexually active: No.  ?The current method of family planning is post menopausal status.    ?Exercising: Yes.    Gym/ health club routine includes low impact aerobics and swimming. Walking  ?Smoker:  no ? ?Health Maintenance: ?Pap:  05/07/20 [id number] WNL NEG HR HPV  ?History of abnormal Pap:  Yes years ago, no surgery on her cervix  ?MMG:  06/26/20 density C Bi-rads 1 neg  ?BMD:   01/2017 Osteopenia, followed by endocrinologist. She was on fosamax in the past, she reports damage to her jaw bone. She has TMJ  ?Colonoscopy: 07/15/18 F/U 3 years  ?TDaP:  05/30/17  ?Gardasil: n/a ? ? reports that she has never smoked. She has been exposed to tobacco smoke. She has never used smokeless tobacco. She reports current alcohol use. She reports that she does not use drugs. ? ?Past Medical  History:  ?Diagnosis Date  ? Anxiety   ? Cancer [facility])   ? basal cell skin biopsies X2  ? 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 [place]  ? Chronic fatigue   ? Constipation   ? Depression   ? Depression   ? Fibromyalgia 09/1996  ? [place]  ? 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  ? Hyperlipidemia   ? Impaired hearing   ? left ear, wears hearing aids  ? Insomnia   ? circadian rhythm component  ? Insomnia   ? Migraine 11/1986  ? [place]  ? Nuclear sclerosis   ? OSA on CPAP 03/2005  ? uses cpap setting of 10  ? Osteoarthritis 2007  ? [place]  ? Pain last week  ? left under breast pain   ? Plantar fasciitis   ? Rheumatic fever   ? Scarlet fever as child  ? Sleep apnea   ? Swallowing difficulty   ? Thyroid disorder   ? Vitamin D deficiency   ? Vitreous degeneration of right eye   ? ? ?Past Surgical History:  ?Procedure Laterality Date  ? BREAST BIOPSY Left 6/00  ? [place]  ? BREAST EXCISIONAL BIOPSY Left 1998  ? DE QUERVAIN'S RELEASE Right 10/97  ? [place]  ? left breast biopsy    ? ROOT CANAL  02/11/2012  ?  TONSILLECTOMY  age 5  ? [place]  ? 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  ? TOTAL KNEE ARTHROPLASTY Right 11/08/2012  ? Procedure: RIGHT TOTAL KNEE ARTHROPLASTY;  Surgeon: Gearlean Alf, MD;  Location: [facility];  Service: Orthopedics;  Laterality: Right;  ? TUBAL LIGATION    ? ? ?Current Outpatient Medications  ?Medication Sig Dispense Refill  ? aspirin EC 81 MG tablet Take 81 mg by mouth daily.    ? doxepin (SINEQUAN) 100 MG capsule Take 1 capsule (100 mg total) by mouth at bedtime. 90 capsule 1  ? doxepin (SINEQUAN) 50 MG capsule Take 1 capsule (50 mg) by mouth each morning 90 capsule 1  ? Eszopiclone 3 MG TABS Take 3 mg by mouth at bedtime. Take immediately before bedtime    ? levothyroxine (SYNTHROID, LEVOTHROID) 200 MCG tablet Take 200 mcg by mouth daily before breakfast.    ? LORazepam (ATIVAN) 1 MG tablet Take 1 tablet (1 mg total) by mouth 2 (two) times daily. 30 tablet 1  ?  lubiprostone (AMITIZA) 24 MCG capsule Take 24 mcg by mouth 2 (two) times daily with a meal.    ? MELATONIN PO Take 20 mg by mouth at bedtime.     ? methocarbamol (ROBAXIN) 500 MG tablet Take 1 tablet (500 mg total) by mouth 4 (four) times daily. 30 tablet 2  ? montelukast (SINGULAIR) 10 MG tablet Take 10 mg by mouth every evening.  3  ? polyethylene glycol (MIRALAX / GLYCOLAX) packet Take 17 g by mouth daily.    ? traMADol (ULTRAM) 50 MG tablet Take 1 tablet (50 mg total) by mouth in the morning, at noon, and at bedtime. 90 tablet 4  ? UBRELVY 50 MG TABS Take by mouth as needed.    ? Vitamin D, Ergocalciferol, (DRISDOL) 1.25 MG (50000 UNIT) CAPS capsule Take 1 capsule (50,000 Units total) by mouth every 7 (seven) days. 8 capsule 0  ? ?No current facility-administered medications for this visit.  ? ? ?Family History  ?Problem Relation Age of Onset  ? Cancer Mother   ? Thyroid disease Mother   ? Hypertension Mother   ? Breast cancer Mother   ? Aneurysm Father   ? Migraines Father   ? High blood pressure Brother   ? Melanoma Brother   ? Kidney cancer Brother   ?     Lesion on kidney  ? Arrhythmia Brother   ? Heart attack Paternal Grandmother   ? Heart attack Paternal Grandfather   ? Breast cancer Maternal Aunt   ? Breast cancer Maternal Aunt   ? Breast cancer Maternal Aunt   ? Breast cancer Maternal Aunt   ? Heart disease Other   ?     Grandmother  ? ? ?Review of Systems  ?Genitourinary:  Positive for frequency and vaginal pain.  ?All other systems reviewed and are negative. ? ?Exam:   ?BP 112/64   Pulse 77   Ht 5' 5.5" (1.664 m)   Wt 173 lb (78.5 kg)   LMP 02/25/1995 (Approximate)   SpO2 100%   BMI 28.35 kg/m?   Weight change: '@WEIGHTCHANGE'$ @ Height:   Height: 5' 5.5" (166.4 cm)  ?Ht Readings from Last 3 Encounters:  ?05/27/21 5' 5.5" (1.664 m)  ?05/23/21 '5\' 6"'$  (1.676 m)  ?05/09/21 '5\' 6"'$  (1.676 m)  ? ? ?General appearance: alert, cooperative and appears stated age ?Head: Normocephalic, without obvious  abnormality, atraumatic ?Neck: no adenopathy, supple, symmetrical, trachea midline and thyroid normal  to inspection and palpation ?Breasts: normal appearance, no masses or tenderness ?Abdomen: soft, non-tender; non distended,  no masses,  no organomegaly ?Extremities: extremities normal, atraumatic, no cyanosis or edema ?Skin: Skin color, texture, turgor normal. No rashes or lesions ?Lymph nodes: Cervical, supraclavicular, and axillary nodes normal. ?No abnormal inguinal nodes palpated ?Neurologic: Grossly normal ? ? ?Pelvic: External genitalia:  no lesions ?             Urethra:  normal appearing urethra with no masses, tenderness or lesions ?             Bartholins and Skenes: normal    ?             Vagina: normal appearing vagina with normal color and discharge, no lesions ?             Cervix: no lesions ?              ?Bimanual Exam:  Uterus:  normal size, contour, position, consistency, mobility, non-tender ?             Adnexa: no mass, fullness, tenderness ?              Rectovaginal: Confirms ?              Anus:  normal sphincter tone, no lesions ? Pelvic floor: very tender and tight on the left (replicates the patient's pain) ? ?Marisa Sprinkles, CMA chaperoned for the exam. ? ? ?1. Encounter for gynecological examination without abnormal finding ?No pap this year ?Colonoscopy due in 5/23 ?Mammogram due in May ?Labs with primary ? ?2. Vaginal pain ?Pelvic floor tender on exam on the left. ? ?3. Pelvic floor dysfunction ?Will refer for pelvic floor PT ? ?4. History of herpes genitalis ?Rare outbreaks ?She will call when she needs a refill ? ?5. Family history of breast cancer ? ?6. Urinary frequency ?Voids normal amounts, drinks large amounts of caffeine, normal HgbA1C ?- Urinalysis,Complete w/RFL Culture ? ?In addition to the breast and pelvic exam, over 20 minutes was spent in regards to her other issues (vaginal pain, pelvic floor tenderness and urinary c/o).  ?

## 2021-05-23 ENCOUNTER — Ambulatory Visit (INDEPENDENT_AMBULATORY_CARE_PROVIDER_SITE_OTHER): Payer: Medicare HMO | Admitting: Adult Health

## 2021-05-23 ENCOUNTER — Encounter (INDEPENDENT_AMBULATORY_CARE_PROVIDER_SITE_OTHER): Payer: Self-pay | Admitting: Adult Health

## 2021-05-23 VITALS — BP 133/72 | HR 76 | Temp 98.0°F | Ht 66.0 in | Wt 168.0 lb

## 2021-05-23 DIAGNOSIS — E669 Obesity, unspecified: Secondary | ICD-10-CM

## 2021-05-23 DIAGNOSIS — E559 Vitamin D deficiency, unspecified: Secondary | ICD-10-CM | POA: Diagnosis not present

## 2021-05-23 DIAGNOSIS — Z6827 Body mass index (BMI) 27.0-27.9, adult: Secondary | ICD-10-CM

## 2021-05-23 MED ORDER — VITAMIN D (ERGOCALCIFEROL) 1.25 MG (50000 UNIT) PO CAPS: 50000.0000 [IU] | ORAL_CAPSULE | ORAL | 0 refills | Status: DC

## 2021-05-23 NOTE — Telephone Encounter (Signed)
Please advise 

## 2021-05-27 ENCOUNTER — Ambulatory Visit (INDEPENDENT_AMBULATORY_CARE_PROVIDER_SITE_OTHER): Payer: Medicare HMO | Admitting: Obstetrics and Gynecology

## 2021-05-27 ENCOUNTER — Encounter (INDEPENDENT_AMBULATORY_CARE_PROVIDER_SITE_OTHER): Payer: Self-pay | Admitting: Family Medicine

## 2021-05-27 VITALS — BP 112/64 | HR 77 | Ht 65.5 in | Wt 173.0 lb

## 2021-05-27 DIAGNOSIS — R102 Pelvic and perineal pain: Secondary | ICD-10-CM | POA: Diagnosis not present

## 2021-05-27 DIAGNOSIS — M6289 Other specified disorders of muscle: Secondary | ICD-10-CM

## 2021-05-27 DIAGNOSIS — Z8619 Personal history of other infectious and parasitic diseases: Secondary | ICD-10-CM | POA: Diagnosis not present

## 2021-05-27 DIAGNOSIS — R350 Frequency of micturition: Secondary | ICD-10-CM

## 2021-05-27 DIAGNOSIS — Z803 Family history of malignant neoplasm of breast: Secondary | ICD-10-CM | POA: Diagnosis not present

## 2021-05-27 DIAGNOSIS — M542 Cervicalgia: Secondary | ICD-10-CM | POA: Insufficient documentation

## 2021-05-27 DIAGNOSIS — I5189 Other ill-defined heart diseases: Secondary | ICD-10-CM | POA: Insufficient documentation

## 2021-05-27 DIAGNOSIS — Z01419 Encounter for gynecological examination (general) (routine) without abnormal findings: Secondary | ICD-10-CM

## 2021-05-27 DIAGNOSIS — I2510 Atherosclerotic heart disease of native coronary artery without angina pectoris: Secondary | ICD-10-CM | POA: Insufficient documentation

## 2021-05-27 DIAGNOSIS — Z9189 Other specified personal risk factors, not elsewhere classified: Secondary | ICD-10-CM | POA: Diagnosis not present

## 2021-05-27 NOTE — Telephone Encounter (Signed)
FYI

## 2021-05-27 NOTE — Telephone Encounter (Signed)
Katy ?

## 2021-05-27 NOTE — Telephone Encounter (Signed)
Please send back once appointment needs have been addressed. ?

## 2021-05-28 ENCOUNTER — Telehealth: Payer: Self-pay

## 2021-05-28 ENCOUNTER — Encounter: Payer: Self-pay | Admitting: Obstetrics and Gynecology

## 2021-05-28 NOTE — Telephone Encounter (Signed)
Salvadore Dom, MD  [facility] Triage ?Referral made to PT at [facility].  ?

## 2021-05-28 NOTE — Telephone Encounter (Signed)
Referral request with records faxed to [facility]. ?

## 2021-05-28 NOTE — Telephone Encounter (Signed)
Please advise. Pt canceled 05/30/21 because she did not want our lab technician to draw her blood. Pt has been offered the following appointments and has declined all of them. 05/27/21 at 10:30 or 12:30, 05/28/21 with Katie, 05/30/21 with Colletta [place], or 06/03/21 with Colletta [place].

## 2021-05-29 ENCOUNTER — Ambulatory Visit (INDEPENDENT_AMBULATORY_CARE_PROVIDER_SITE_OTHER): Payer: Medicare HMO | Admitting: Adult Health

## 2021-05-29 ENCOUNTER — Encounter (INDEPENDENT_AMBULATORY_CARE_PROVIDER_SITE_OTHER): Payer: Self-pay | Admitting: Adult Health

## 2021-05-29 VITALS — BP 125/73 | HR 79 | Temp 98.0°F | Ht 66.0 in | Wt 169.0 lb

## 2021-05-29 DIAGNOSIS — E669 Obesity, unspecified: Secondary | ICD-10-CM | POA: Diagnosis not present

## 2021-05-29 DIAGNOSIS — R5383 Other fatigue: Secondary | ICD-10-CM | POA: Diagnosis not present

## 2021-05-29 DIAGNOSIS — Z6827 Body mass index (BMI) 27.0-27.9, adult: Secondary | ICD-10-CM

## 2021-05-29 LAB — URINALYSIS, COMPLETE W/RFL CULTURE
Bilirubin Urine: NEGATIVE
Glucose, UA: NEGATIVE
Hgb urine dipstick: NEGATIVE
Hyaline Cast: NONE SEEN /LPF
Ketones, ur: NEGATIVE
Nitrites, Initial: NEGATIVE
Protein, ur: NEGATIVE
RBC / HPF: NONE SEEN /HPF (ref 0–2)
Specific Gravity, Urine: 1.01 (ref 1.001–1.035)
pH: 5.5 (ref 5.0–8.0)

## 2021-05-29 LAB — URINE CULTURE
MICRO NUMBER:: 13214047
Result:: NO GROWTH
SPECIMEN QUALITY:: ADEQUATE

## 2021-05-29 LAB — CULTURE INDICATED

## 2021-05-29 NOTE — Progress Notes (Signed)
? ? ? ?Chief Complaint:  ? ?OBESITY ?[name] is here to discuss her progress with her obesity treatment plan along with follow-up of her obesity related diagnoses. [name] is on the Category 2 Plan and keeping a food journal and adhering to recommended goals of 400-500 calories and 35 grams of protein and states she is following her eating plan approximately 0% of the time. [name] states she is swimming for 60-90 minutes 3 times per week. ? ?Today's visit was #: 13 ?Starting weight: 188 lbs ?Starting date: 08/22/2020 ?Today's weight: 168 lbs ?Today's date: 05-23-2021 ?Total lbs lost to date: 20 lbs ?Total lbs lost since last in-office visit: 0 ? ?Interim History:  ?Bioimpedance reflects:  Muscle Mass +2 lbs ?Adipose Mass +2.8 lbs ?Water weight +2 lbs ? ?Upcoming [place] and [place], [place]. ?She will have out of town guests staying with her soon. ? ?She endorses increased cravings for potatoes/sweets.   ?She has been unable to finish all of breakfast eggs.   ?She denies any ETOH use. ? ?She is frustrated with Cat 2 meal plan and would like to discuss other eating plan options. ? ?Subjective:  ? ?1. Vitamin D deficiency ?On 01/09/21, vitamin D level - 39.9 - below goal of 50-70. ?She is on Ergocalciferol- denies N/V/muscle weakness. ? ?Assessment/Plan:  ? ?1. Vitamin D deficiency ?Check labs at next office visit. ?Refill ergocalciferol 50,000 IU once weekly. ? ?- Refill Vitamin D, Ergocalciferol, (DRISDOL) 1.25 MG (50000 UNIT) CAPS capsule; Take 1 capsule (50,000 Units total) by mouth every 7 (seven) days.  Dispense: 8 capsule; Refill: 0 ? ?2. Obesity, current BMI 27.2 ? ?[name] is currently in the action stage of change. As such, her goal is to continue with weight loss efforts. She has agreed to following a lower carbohydrate, vegetable and lean protein rich diet plan.  ? ?Exercise goals:  As is. ? ?Behavioral modification strategies: increasing lean protein intake, decreasing simple carbohydrates, meal planning and  cooking strategies, keeping healthy foods in the home, and planning for success. ? ?[name] has agreed to follow-up with our clinic in 2 weeks. She was informed of the importance of frequent follow-up visits to maximize her success with intensive lifestyle modifications for her multiple health conditions.  ? ?Objective:  ? ?Blood pressure 133/72, pulse 76, temperature 98 ?F (36.7 ?C), height '5\' 6"'$  (1.676 m), weight 168 lb (76.2 kg), last menstrual period 02/25/1995, SpO2 96 %. ?Body mass index is 27.12 kg/m?. ? ?General: Cooperative, alert, well developed, in no acute distress. ?HEENT: Conjunctivae and lids unremarkable. ?Cardiovascular: Regular rhythm.  ?Lungs: Normal work of breathing. ?Neurologic: No focal deficits.  ? ?Lab Results  ?Component Value Date  ? CREATININE 0.55 (L) 08/22/2020  ? BUN 11 08/22/2020  ? NA 142 08/22/2020  ? K 4.7 08/22/2020  ? CL 101 08/22/2020  ? CO2 24 08/22/2020  ? ?Lab Results  ?Component Value Date  ? ALT 20 01/09/2021  ? AST 19 01/09/2021  ? ALKPHOS 91 08/22/2020  ? BILITOT 0.4 08/22/2020  ? ?Lab Results  ?Component Value Date  ? HGBA1C 5.3 01/09/2021  ? HGBA1C 5.8 (H) 08/22/2020  ? HGBA1C 5.5 08/27/2015  ? ?Lab Results  ?Component Value Date  ? INSULIN 6.9 08/22/2020  ? ?Lab Results  ?Component Value Date  ? TSH 0.01 (A) 01/09/2021  ? ?Lab Results  ?Component Value Date  ? CHOL 149 01/09/2021  ? HDL 46 01/09/2021  ? [place] 89 01/09/2021  ? TRIG 69 01/09/2021  ? CHOLHDL 3.9  02/11/2017  ? ?Lab Results  ?Component Value Date  ? VD25OH 39.9 01/09/2021  ? VD25OH 30.4 08/22/2020  ? VD25OH 28 (L) 02/11/2017  ? ?Lab Results  ?Component Value Date  ? WBC 6.5 08/22/2020  ? HGB 14.9 08/22/2020  ? HCT 45.4 08/22/2020  ? MCV 87 08/22/2020  ? PLT 222 02/11/2017  ? ?Obesity Behavioral Intervention:  ? ?Approximately 15 minutes were spent on the discussion below. ? ?ASK: ?We discussed the diagnosis of obesity with [name] today and [name] agreed to give [place] permission to discuss obesity behavioral  modification therapy today. ? ?ASSESS: ?[name] has the diagnosis of obesity and her BMI today is 27.2. [name] is in the action stage of change.  ? ?ADVISE: ?Nikelle was educated on the multiple health risks of obesity as well as the benefit of weight loss to improve her health. She was advised of the need for long term treatment and the importance of lifestyle modifications to improve her current health and to decrease her risk of future health problems. ? ?AGREE: ?Multiple dietary modification options and treatment options were discussed and [name] agreed to follow the recommendations documented in the above note. ? ?ARRANGE: ?[name] was educated on the importance of frequent visits to treat obesity as outlined per CMS and USPSTF guidelines and agreed to schedule her next follow up appointment today. ? ?Attestation Statements:  ? ?Reviewed by clinician on day of visit: allergies, medications, problem list, medical history, surgical history, family history, social history, and previous encounter notes. ? ?I, Water quality scientist, CMA, am acting as Location manager for Mina Marble, NP. ? ?I have reviewed the above documentation for accuracy and completeness, and I agree with the above. -  Candy Leverett d. Vinson Tietze, NP-C ?

## 2021-05-30 ENCOUNTER — Encounter (INDEPENDENT_AMBULATORY_CARE_PROVIDER_SITE_OTHER): Payer: Self-pay

## 2021-05-30 ENCOUNTER — Ambulatory Visit (INDEPENDENT_AMBULATORY_CARE_PROVIDER_SITE_OTHER): Payer: Medicare HMO | Admitting: Adult Health

## 2021-06-06 NOTE — Progress Notes (Signed)
? ? ? ?Chief Complaint:  ? ?OBESITY ?[name] is here to discuss her progress with her obesity treatment plan along with follow-up of her obesity related diagnoses. [name] is on following a lower carbohydrate, vegetable and lean protein rich diet plan and states she is following her eating plan approximately 0% of the time. [name] states she is swimming for 60 minutes 4 times per week. ? ?Today's visit was #: 14 ?Starting weight: 188 lbs ?Starting date: 08/22/2020 ?Today's weight: 169 lbs ?Today's date: 05/29/2021 ?Total lbs lost to date: 19 lbs ?Total lbs lost since last in-office visit: 0 ? ?Interim History:  ?[name] says she was unable to follow the Low CHO plan.  Requests meal plan change. ? ?Subjective:  ? ?1. Other fatigue ?She endorses and increase in fatigue, despite regular exercise- swimming 26mn 4 x week. ?She is on Levothyroxine 2023m QD-managed by her PCP. ? ?Assessment/Plan:  ? ?1. Other fatigue ?At next lab draw- check Thyroid panel, Vit D, and B12 levels. ? ?2. Obesity, current BMI 27.4 ? ?[name] currently in the action stage of change. As such, her goal is to continue with weight loss efforts. She has agreed to the Category 2 Plan with snack calories with 400-500 calories and 35 grams of protein at lunch and 600-700 calories and 35+ grams of protein at supper.  ? ?Exercise goals:  As is. ? ?Behavioral modification strategies: increasing lean protein intake, decreasing simple carbohydrates, meal planning and cooking strategies, keeping healthy foods in the home, and planning for success. ? ?[name] agreed to follow-up with our clinic in 2 weeks. She was informed of the importance of frequent follow-up visits to maximize her success with intensive lifestyle modifications for her multiple health conditions.  ? ?Objective:  ? ?Blood pressure 125/73, pulse 79, temperature 98 ?F (36.7 ?C), height '5\' 6"'$  (1.676 m), weight 169 lb (76.7 kg), last menstrual period 02/25/1995, SpO2 96 %. ?Body mass index is 27.28  kg/m?. ? ?General: Cooperative, alert, well developed, in no acute distress. ?HEENT: Conjunctivae and lids unremarkable. ?Cardiovascular: Regular rhythm.  ?Lungs: Normal work of breathing. ?Neurologic: No focal deficits.  ? ?Lab Results  ?Component Value Date  ? CREATININE 0.55 (L) 08/22/2020  ? BUN 11 08/22/2020  ? NA 142 08/22/2020  ? K 4.7 08/22/2020  ? CL 101 08/22/2020  ? CO2 24 08/22/2020  ? ?Lab Results  ?Component Value Date  ? ALT 20 01/09/2021  ? AST 19 01/09/2021  ? ALKPHOS 91 08/22/2020  ? BILITOT 0.4 08/22/2020  ? ?Lab Results  ?Component Value Date  ? HGBA1C 5.3 01/09/2021  ? HGBA1C 5.8 (H) 08/22/2020  ? HGBA1C 5.5 08/27/2015  ? ?Lab Results  ?Component Value Date  ? INSULIN 6.9 08/22/2020  ? ?Lab Results  ?Component Value Date  ? TSH 0.01 (A) 01/09/2021  ? ?Lab Results  ?Component Value Date  ? CHOL 149 01/09/2021  ? HDL 46 01/09/2021  ? LDCovel9 01/09/2021  ? TRIG 69 01/09/2021  ? CHOLHDL 3.9 02/11/2017  ? ?Lab Results  ?Component Value Date  ? VD25OH 39.9 01/09/2021  ? VD25OH 30.4 08/22/2020  ? VD25OH 28 (L) 02/11/2017  ? ?Lab Results  ?Component Value Date  ? WBC 6.5 08/22/2020  ? HGB 14.9 08/22/2020  ? HCT 45.4 08/22/2020  ? MCV 87 08/22/2020  ? PLT 222 02/11/2017  ? ?Attestation Statements:  ? ?Reviewed by clinician on day of visit: allergies, medications, problem list, medical history, surgical history, family history, social history, and previous encounter  notes. ? ?Time spent on visit including pre-visit chart review and post-visit care and charting was 28 minutes.  ? ?I, Water quality scientist, CMA, am acting as Location manager for Mina Marble, NP. ? ?I have reviewed the above documentation for accuracy and completeness, and I agree with the above. -  Kerigan Narvaez d. Lawarence Meek, NP-C ?

## 2021-06-09 ENCOUNTER — Encounter (INDEPENDENT_AMBULATORY_CARE_PROVIDER_SITE_OTHER): Payer: Self-pay

## 2021-06-10 ENCOUNTER — Encounter (INDEPENDENT_AMBULATORY_CARE_PROVIDER_SITE_OTHER): Payer: Self-pay | Admitting: Family Medicine

## 2021-06-10 ENCOUNTER — Ambulatory Visit (INDEPENDENT_AMBULATORY_CARE_PROVIDER_SITE_OTHER): Payer: Medicare HMO | Admitting: Family Medicine

## 2021-06-10 VITALS — BP 117/79 | HR 77 | Temp 98.1°F | Ht 66.0 in | Wt 166.0 lb

## 2021-06-10 DIAGNOSIS — E669 Obesity, unspecified: Secondary | ICD-10-CM

## 2021-06-10 DIAGNOSIS — Z6826 Body mass index (BMI) 26.0-26.9, adult: Secondary | ICD-10-CM

## 2021-06-10 DIAGNOSIS — E559 Vitamin D deficiency, unspecified: Secondary | ICD-10-CM

## 2021-06-10 DIAGNOSIS — R7303 Prediabetes: Secondary | ICD-10-CM | POA: Diagnosis not present

## 2021-06-11 ENCOUNTER — Other Ambulatory Visit: Payer: Self-pay | Admitting: Sports Medicine

## 2021-06-20 NOTE — Progress Notes (Signed)
? ? ? ?Chief Complaint:  ? ?OBESITY ?[name] is here to discuss her progress with her obesity treatment plan along with follow-up of her obesity related diagnoses. [name] is on the Category 2 Plan and keeping a food journal and adhering to recommended goals of 400-500 calories and 35 grams of protein at lunch, and 600-700 calories and 35+ grams of protein daily and states she is following her eating plan approximately 70% of the time. [name] states she is water exercise for 60 minutes 5 times per week. ? ?Today's visit was #: 15 ?Starting weight: 188 lbs ?Starting date: 08/22/2020 ?Today's weight: 166 lbs ?Today's date: 06/10/2021 ?Total lbs lost to date: 58 ?Total lbs lost since last in-office visit: 3 ? ?Interim History: [name] has done well with weight loss even over her birthday. She is working on following her Category 2 very closely and she has not deviated much. Her hunger is controlled.  ? ?Subjective:  ? ?1. Vitamin D deficiency ?[name]'s Vitamin D level is not yet at goal. No side effects were noted.  ? ?2. Pre-diabetes ?[name] is doing very well with diet and weight loss. She is due for labs soon. She notes decreased polyphagia. ? ?Assessment/Plan:  ? ?1. Vitamin D deficiency ?We will refill prescription Vitamin D 50,000 IU weekly #4 for 1 month.  [name] is to get labs done at her PCP. She will follow-up for routine testing of Vitamin D, at least 2-3 times per year to avoid over-replacement. ? ?2. Pre-diabetes ?[name] will continue her diet, exercise, and she will have her labs rechecked at her PCP soon. ? ?3. Obesity, with current BMI of 26.8 ?[name] is currently in the action stage of change. As such, her goal is to continue with weight loss efforts. She has agreed to the Category 2 Plan.  ? ?Exercise goals: As is.  ? ?Behavioral modification strategies: increasing lean protein intake and meal planning and cooking strategies. ? ?[name] has agreed to follow-up with our clinic in 2 weeks. She was informed of the importance of  frequent follow-up visits to maximize her success with intensive lifestyle modifications for her multiple health conditions.  ? ?Objective:  ? ?Blood pressure 117/79, pulse 77, temperature 98.1 ?F (36.7 ?C), height '5\' 6"'$  (1.676 m), weight 166 lb (75.3 kg), last menstrual period 02/25/1995, SpO2 96 %. ?Body mass index is 26.79 kg/m?. ? ?General: Cooperative, alert, well developed, in no acute distress. ?HEENT: Conjunctivae and lids unremarkable. ?Cardiovascular: Regular rhythm.  ?Lungs: Normal work of breathing. ?Neurologic: No focal deficits.  ? ?Lab Results  ?Component Value Date  ? CREATININE 0.55 (L) 08/22/2020  ? BUN 11 08/22/2020  ? NA 142 08/22/2020  ? K 4.7 08/22/2020  ? CL 101 08/22/2020  ? CO2 24 08/22/2020  ? ?Lab Results  ?Component Value Date  ? ALT 20 01/09/2021  ? AST 19 01/09/2021  ? ALKPHOS 91 08/22/2020  ? BILITOT 0.4 08/22/2020  ? ?Lab Results  ?Component Value Date  ? HGBA1C 5.3 01/09/2021  ? HGBA1C 5.8 (H) 08/22/2020  ? HGBA1C 5.5 08/27/2015  ? ?Lab Results  ?Component Value Date  ? INSULIN 6.9 08/22/2020  ? ?Lab Results  ?Component Value Date  ? TSH 0.01 (A) 01/09/2021  ? ?Lab Results  ?Component Value Date  ? CHOL 149 01/09/2021  ? HDL 46 01/09/2021  ? [place] 89 01/09/2021  ? TRIG 69 01/09/2021  ? CHOLHDL 3.9 02/11/2017  ? ?Lab Results  ?Component Value Date  ? VD25OH 39.9 01/09/2021  ? [place]  30.4 08/22/2020  ? VD25OH 28 (L) 02/11/2017  ? ?Lab Results  ?Component Value Date  ? WBC 6.5 08/22/2020  ? HGB 14.9 08/22/2020  ? HCT 45.4 08/22/2020  ? MCV 87 08/22/2020  ? PLT 222 02/11/2017  ? ?No results found for: IRON, TIBC, FERRITIN ? ?Obesity Behavioral Intervention:  ? ?Approximately 15 minutes were spent on the discussion below. ? ?ASK: ?We discussed the diagnosis of obesity with [name] today and [name] agreed to give [place] permission to discuss obesity behavioral modification therapy today. ? ?ASSESS: ?[name] has the diagnosis of obesity and her BMI today is 26.8. [name] is in the action stage of  change.  ? ?ADVISE: ?[name] was educated on the multiple health risks of obesity as well as the benefit of weight loss to improve her health. She was advised of the need for long term treatment and the importance of lifestyle modifications to improve her current health and to decrease her risk of future health problems. ? ?AGREE: ?Multiple dietary modification options and treatment options were discussed and [name] agreed to follow the recommendations documented in the above note. ? ?ARRANGE: ?[name] was educated on the importance of frequent visits to treat obesity as outlined per CMS and USPSTF guidelines and agreed to schedule her next follow up appointment today. ? ?Attestation Statements:  ? ?Reviewed by clinician on day of visit: allergies, medications, problem list, medical history, surgical history, family history, social history, and previous encounter notes. ? ? ?I, Trixie Dredge, am acting as transcriptionist for Dennard Nip, MD. ? ?I have reviewed the above documentation for accuracy and completeness, and I agree with the above. -  Dennard Nip, MD ? ? ?

## 2021-06-21 MED ORDER — VITAMIN D (ERGOCALCIFEROL) 1.25 MG (50000 UNIT) PO CAPS: 50000.0000 [IU] | ORAL_CAPSULE | ORAL | 0 refills | Status: DC

## 2021-06-25 ENCOUNTER — Ambulatory Visit (INDEPENDENT_AMBULATORY_CARE_PROVIDER_SITE_OTHER): Payer: Medicare HMO | Admitting: Adult Health

## 2021-06-25 ENCOUNTER — Encounter (INDEPENDENT_AMBULATORY_CARE_PROVIDER_SITE_OTHER): Payer: Self-pay

## 2021-06-27 ENCOUNTER — Ambulatory Visit
Admission: RE | Admit: 2021-06-27 | Discharge: 2021-06-27 | Disposition: A | Discharge status: Home / Self Care | Payer: Medicare HMO | Source: Ambulatory Visit | Attending: Obstetrics and Gynecology | Admitting: Obstetrics and Gynecology

## 2021-06-27 ENCOUNTER — Ambulatory Visit: Payer: No Typology Code available for payment source

## 2021-06-27 DIAGNOSIS — Z1231 Encounter for screening mammogram for malignant neoplasm of breast: Secondary | ICD-10-CM

## 2021-07-01 ENCOUNTER — Telehealth: Payer: Self-pay

## 2021-07-01 NOTE — Telephone Encounter (Signed)
Patient left voice mail stating that at her visit in March she left a letter that she ask Dr. Talbert Nan to send a copy of to [facility] stating that she recommended brand Rx. ? ?I called patient back and left message that I am looking into this but would like her to call me back with the name of what medication she was needing the Brand. ?

## 2021-07-01 NOTE — Telephone Encounter (Signed)
Patient called back and she said the letter was about Brand Diflucan. ? ?Dr. Johnell Comings you recall? ?

## 2021-07-02 ENCOUNTER — Encounter (INDEPENDENT_AMBULATORY_CARE_PROVIDER_SITE_OTHER): Payer: Self-pay | Admitting: Family Medicine

## 2021-07-02 ENCOUNTER — Ambulatory Visit (INDEPENDENT_AMBULATORY_CARE_PROVIDER_SITE_OTHER): Payer: Medicare HMO | Admitting: Family Medicine

## 2021-07-02 VITALS — BP 100/65 | HR 55 | Temp 97.9°F | Ht 66.0 in | Wt 165.0 lb

## 2021-07-02 DIAGNOSIS — E559 Vitamin D deficiency, unspecified: Secondary | ICD-10-CM

## 2021-07-02 DIAGNOSIS — Z6826 Body mass index (BMI) 26.0-26.9, adult: Secondary | ICD-10-CM | POA: Diagnosis not present

## 2021-07-02 DIAGNOSIS — E669 Obesity, unspecified: Secondary | ICD-10-CM

## 2021-07-02 MED ORDER — VITAMIN D (ERGOCALCIFEROL) 1.25 MG (50000 UNIT) PO CAPS: 50000.0000 [IU] | ORAL_CAPSULE | ORAL | 0 refills | Status: DC

## 2021-07-03 NOTE — Telephone Encounter (Signed)
I spoke with patient again. This time she indicated she left the letter with Eustace Pen.  The letter was from Helene Shoe, [place] and stated that patient needed brand Diflucan due to trying multiple other things, etc.  She wanted [place] to type it and send it to [place] from our office. ? ?She has not had a yeast infection in >2 years but is "just wanting to get ahead of it" in case she does and needs Diflucan. Does not currently need Rx. ? ?I did explain to her most likely will require PA when prescribed and we can handle that for her. ?

## 2021-07-03 NOTE — Telephone Encounter (Signed)
I haven't prescribed diflucan for her for over 2 years. When I did I listed brand only. I didn't write anything about this in our visit in 4/23. In 3/21 I said that she needed antibiotics for dental procedures and would take diflucan to prevent yeast.  ?Can you please call and speak with her. Ask what happened with the generic diflucan? Is she asking for a script? ?

## 2021-07-11 ENCOUNTER — Ambulatory Visit (INDEPENDENT_AMBULATORY_CARE_PROVIDER_SITE_OTHER): Payer: Medicare HMO | Admitting: Sports Medicine

## 2021-07-11 DIAGNOSIS — M47812 Spondylosis without myelopathy or radiculopathy, cervical region: Secondary | ICD-10-CM | POA: Diagnosis not present

## 2021-07-11 NOTE — Progress Notes (Signed)
Chief complaint follow-up of cervical radiculopathy and chronic headaches  Patient states that she has not gone 2-1/2 months without a headache.  That is the longest period without a headache since she was 81 years old  On last visit she was having a few nights with more spasm around her neck and trapezius muscles We added Robaxin She continues on her doxepin With those 2 combinations all of this tightness and spasm seem to resolve  She has successfully lost weight She is now doing pool exercises 6 days/week Overall her attitude is very positive and she feels like she has made great progress  Physical examination Pleasant white female in no acute distress BP 124/62   Ht '5\' 6"'$  (1.676 m)   Wt 165 lb (74.8 kg)   LMP 02/25/1995 (Approximate)   BMI 26.63 kg/m  Neck extension, rotation and lateral bending is all limited Neck flexion is normal No radicular symptoms with any neck motion or testing Good strength at her rotator cuffs C5-T1 testing reveals both normal sensation and normal strength

## 2021-07-11 NOTE — Assessment & Plan Note (Signed)
She is doing extremely well on her current medication and exercise regimen I do not wish for her to make any changes at this time She will keep up the exercise programs We will recheck her in 3 months

## 2021-07-16 ENCOUNTER — Ambulatory Visit (INDEPENDENT_AMBULATORY_CARE_PROVIDER_SITE_OTHER): Payer: Medicare HMO | Admitting: Family Medicine

## 2021-07-16 ENCOUNTER — Encounter (INDEPENDENT_AMBULATORY_CARE_PROVIDER_SITE_OTHER): Payer: Self-pay | Admitting: Family Medicine

## 2021-07-16 VITALS — BP 110/63 | HR 79 | Temp 97.3°F | Ht 66.0 in | Wt 166.0 lb

## 2021-07-16 DIAGNOSIS — E669 Obesity, unspecified: Secondary | ICD-10-CM

## 2021-07-16 DIAGNOSIS — Z6826 Body mass index (BMI) 26.0-26.9, adult: Secondary | ICD-10-CM

## 2021-07-16 DIAGNOSIS — E559 Vitamin D deficiency, unspecified: Secondary | ICD-10-CM | POA: Diagnosis not present

## 2021-07-17 NOTE — Progress Notes (Signed)
Chief Complaint:   OBESITY [name] is here to discuss her progress with her obesity treatment plan along with follow-up of her obesity related diagnoses. [name] is on the Category 2 Plan and states she is following her eating plan approximately 80% of the time. [name] states she is doing water aerobics for 8 hours per week 5 times per week.    Today's visit was #: 16 Starting weight: 188 lbs Starting date: 08/22/2020 Today's weight: 165 lbs Today's date: 07/02/2021 Total lbs lost to date: 23 Total lbs lost since last in-office visit: 1  Interim History: [name] has struggled more in the last month. She has eaten out more, but she is trying to make better choices. She is sleeping well, and exercising almost daily.   Subjective:   1. Vitamin D deficiency [name] is stable on Vitamin D, with no side effects noted. She needs a 90 day refill.   Assessment/Plan:   1. Vitamin D deficiency We will refill prescription Vitamin D for 90 days with no refills. [name] will follow-up for routine testing of Vitamin D, at least 2-3 times per year to avoid over-replacement.  - Vitamin D, Ergocalciferol, (DRISDOL) 1.25 MG (50000 UNIT) CAPS capsule; Take 1 capsule (50,000 Units total) by mouth every 7 (seven) days.  Dispense: 12 capsule; Refill: 0  2. Obesity, Current BMI 26.6 [name] is currently in the action stage of change. As such, her goal is to continue with weight loss efforts. She has agreed to the Category 2 Plan.   Exercise goals: As is.  Behavioral modification strategies: increasing lean protein intake, no skipping meals, and celebration eating strategies.  [name] has agreed to follow-up with our clinic in 2 weeks. She was informed of the importance of frequent follow-up visits to maximize her success with intensive lifestyle modifications for her multiple health conditions.   Objective:   Blood pressure 100/65, pulse (!) 55, temperature 97.9 F (36.6 C), height '5\' 6"'$  (1.676 m), weight 165 lb (74.8  kg), last menstrual period 02/25/1995, SpO2 98 %. Body mass index is 26.63 kg/m.  General: Cooperative, alert, well developed, in no acute distress. HEENT: Conjunctivae and lids unremarkable. Cardiovascular: Regular rhythm.  Lungs: Normal work of breathing. Neurologic: No focal deficits.   Lab Results  Component Value Date   CREATININE 0.55 (L) 08/22/2020   BUN 11 08/22/2020   NA 142 08/22/2020   K 4.7 08/22/2020   CL 101 08/22/2020   CO2 24 08/22/2020   Lab Results  Component Value Date   ALT 20 01/09/2021   AST 19 01/09/2021   ALKPHOS 91 08/22/2020   BILITOT 0.4 08/22/2020   Lab Results  Component Value Date   HGBA1C 5.3 01/09/2021   HGBA1C 5.8 (H) 08/22/2020   HGBA1C 5.5 08/27/2015   Lab Results  Component Value Date   INSULIN 6.9 08/22/2020   Lab Results  Component Value Date   TSH 0.01 (A) 01/09/2021   Lab Results  Component Value Date   CHOL 149 01/09/2021   HDL 46 01/09/2021   LDLCALC 89 01/09/2021   TRIG 69 01/09/2021   CHOLHDL 3.9 02/11/2017   Lab Results  Component Value Date   VD25OH 39.9 01/09/2021   VD25OH 30.4 08/22/2020   VD25OH 28 (L) 02/11/2017   Lab Results  Component Value Date   WBC 6.5 08/22/2020   HGB 14.9 08/22/2020   HCT 45.4 08/22/2020   MCV 87 08/22/2020   PLT 222 02/11/2017   No results found for: IRON,  TIBC, FERRITIN  Obesity Behavioral Intervention:   Approximately 15 minutes were spent on the discussion below.  ASK: We discussed the diagnosis of obesity with [name] today and [name] agreed to give [place] permission to discuss obesity behavioral modification therapy today.  ASSESS: Khaylee has the diagnosis of obesity and her BMI today is 26.6. [name] is in the action stage of change.   ADVISE: [name] was educated on the multiple health risks of obesity as well as the benefit of weight loss to improve her health. She was advised of the need for long term treatment and the importance of lifestyle modifications to improve her  current health and to decrease her risk of future health problems.  AGREE: Multiple dietary modification options and treatment options were discussed and [name] agreed to follow the recommendations documented in the above note.  ARRANGE: [name] was educated on the importance of frequent visits to treat obesity as outlined per CMS and USPSTF guidelines and agreed to schedule her next follow up appointment today.  Attestation Statements:   Reviewed by clinician on day of visit: allergies, medications, problem list, medical history, surgical history, family history, social history, and previous encounter notes.   I, Trixie Dredge, am acting as transcriptionist for Dennard Nip, MD.  I have reviewed the above documentation for accuracy and completeness, and I agree with the above. -  Dennard Nip, MD

## 2021-07-17 NOTE — Telephone Encounter (Signed)
Please call and speak with the patient. Please find out what her issue with the generic diflucan was? Let her know that we can't send a PA until we prescribe the medication.

## 2021-07-29 ENCOUNTER — Ambulatory Visit (INDEPENDENT_AMBULATORY_CARE_PROVIDER_SITE_OTHER): Payer: Medicare HMO | Admitting: Family Medicine

## 2021-07-29 ENCOUNTER — Encounter (INDEPENDENT_AMBULATORY_CARE_PROVIDER_SITE_OTHER): Payer: Self-pay | Admitting: Family Medicine

## 2021-07-29 VITALS — BP 99/66 | HR 77 | Temp 98.1°F | Ht 66.0 in | Wt 167.0 lb

## 2021-07-29 DIAGNOSIS — Z6827 Body mass index (BMI) 27.0-27.9, adult: Secondary | ICD-10-CM

## 2021-07-29 DIAGNOSIS — E669 Obesity, unspecified: Secondary | ICD-10-CM

## 2021-07-29 DIAGNOSIS — M1711 Unilateral primary osteoarthritis, right knee: Secondary | ICD-10-CM

## 2021-07-29 NOTE — Progress Notes (Signed)
Chief Complaint:   OBESITY [name] is here to discuss her progress with her obesity treatment plan along with follow-up of her obesity related diagnoses. [name] is on the Category 2 Plan and states she is following her eating plan approximately 50% of the time. [name] states she is in the pool, walking, and doing water aerobics various times per week.   Today's visit was #: 78 Starting weight: 188 lbs Starting date: 08/22/2020 Today's weight: 166 lbs Today's date: 07/16/2021 Total lbs lost to date: 22 Total lbs lost since last in-office visit: 0  Interim History: [name] has been traveling and doing some celebrating eating. She notes increased PM polyphagia. She is exercising regularly most days of the week.   Subjective:   1. Vitamin D deficiency Memory is stable on Vitamin D, with no side effects noted.  Assessment/Plan:   1. Vitamin D deficiency [name] will continue her Vitamin D as is, and we will recheck labs in 1-2 months.   2. Obesity, Current BMI 26.8 [name] is currently in the action stage of change. As such, her goal is to continue with weight loss efforts. She has agreed to the Category 2 Plan.   Exercise goals: As is.   Behavioral modification strategies: increasing lean protein intake and meal planning and cooking strategies.  [name] has agreed to follow-up with our clinic in 2 weeks. She was informed of the importance of frequent follow-up visits to maximize her success with intensive lifestyle modifications for her multiple health conditions.   Objective:   Blood pressure 110/63, pulse 79, temperature (!) 97.3 F (36.3 C), height '5\' 6"'$  (1.676 m), weight 166 lb (75.3 kg), last menstrual period 02/25/1995, SpO2 93 %. Body mass index is 26.79 kg/m.  General: Cooperative, alert, well developed, in no acute distress. HEENT: Conjunctivae and lids unremarkable. Cardiovascular: Regular rhythm.  Lungs: Normal work of breathing. Neurologic: No focal deficits.   Lab Results   Component Value Date   CREATININE 0.55 (L) 08/22/2020   BUN 11 08/22/2020   NA 142 08/22/2020   K 4.7 08/22/2020   CL 101 08/22/2020   CO2 24 08/22/2020   Lab Results  Component Value Date   ALT 20 01/09/2021   AST 19 01/09/2021   ALKPHOS 91 08/22/2020   BILITOT 0.4 08/22/2020   Lab Results  Component Value Date   HGBA1C 5.3 01/09/2021   HGBA1C 5.8 (H) 08/22/2020   HGBA1C 5.5 08/27/2015   Lab Results  Component Value Date   INSULIN 6.9 08/22/2020   Lab Results  Component Value Date   TSH 0.01 (A) 01/09/2021   Lab Results  Component Value Date   CHOL 149 01/09/2021   HDL 46 01/09/2021   LDLCALC 89 01/09/2021   TRIG 69 01/09/2021   CHOLHDL 3.9 02/11/2017   Lab Results  Component Value Date   VD25OH 39.9 01/09/2021   VD25OH 30.4 08/22/2020   VD25OH 28 (L) 02/11/2017   Lab Results  Component Value Date   WBC 6.5 08/22/2020   HGB 14.9 08/22/2020   HCT 45.4 08/22/2020   MCV 87 08/22/2020   PLT 222 02/11/2017   No results found for: IRON, TIBC, FERRITIN  Attestation Statements:   Reviewed by clinician on day of visit: allergies, medications, problem list, medical history, surgical history, family history, social history, and previous encounter notes.  Time spent on visit including pre-visit chart review and post-visit care and charting was 26 minutes.   Wilhemena Durie, am acting as transcriptionist for  Dennard Nip, MD.  I have reviewed the above documentation for accuracy and completeness, and I agree with the above. -  Dennard Nip, MD

## 2021-07-31 NOTE — Progress Notes (Signed)
Chief Complaint:   OBESITY [name] is here to discuss her progress with her obesity treatment plan along with follow-up of her obesity related diagnoses. [name] is on the Category 2 Plan and states she is following her eating plan approximately 60% of the time. [name] states she is exercising for 60-120 minutes 7 times per week.  Today's visit was #: 18 Starting weight: 188 lbs Starting date: 08/22/2020 Today's weight: 167 lbs Today's date: 07/29/2021 Total lbs lost to date: 21 Total lbs lost since last in-office visit: 0  Interim History: [name] has increased strengthening exercise and she is gaining muscle mass, which is wonderful. She is working on following her eating plan. Her hunger is mostly controlled.   Subjective:   1. Osteoarthritis of right knee, unspecified osteoarthritis type [name] continues to exercise regularly, and she is working on staying active. She continues to work on weight loss to help improve knees pain and mobility.   Assessment/Plan:   1. Osteoarthritis of right knee, unspecified osteoarthritis type [name] will continue her diet, exercise, and weight loss. We will continue to follow.   2. Obesity, Current BMI 27.0 [name] is currently in the action stage of change. As such, her goal is to continue with weight loss efforts. She has agreed to the Category 2 Plan.   Exercise goals: As is.   Behavioral modification strategies: increasing lean protein intake.  [name] has agreed to follow-up with our clinic in 2 weeks. She was informed of the importance of frequent follow-up visits to maximize her success with intensive lifestyle modifications for her multiple health conditions.   Objective:   Blood pressure 99/66, pulse 77, temperature 98.1 F (36.7 C), height '5\' 6"'$  (1.676 m), weight 167 lb (75.8 kg), last menstrual period 02/25/1995, SpO2 95 %. Body mass index is 26.95 kg/m.  General: Cooperative, alert, well developed, in no acute distress. HEENT: Conjunctivae  and lids unremarkable. Cardiovascular: Regular rhythm.  Lungs: Normal work of breathing. Neurologic: No focal deficits.   Lab Results  Component Value Date   CREATININE 0.55 (L) 08/22/2020   BUN 11 08/22/2020   NA 142 08/22/2020   K 4.7 08/22/2020   CL 101 08/22/2020   CO2 24 08/22/2020   Lab Results  Component Value Date   ALT 20 01/09/2021   AST 19 01/09/2021   ALKPHOS 91 08/22/2020   BILITOT 0.4 08/22/2020   Lab Results  Component Value Date   HGBA1C 5.3 01/09/2021   HGBA1C 5.8 (H) 08/22/2020   HGBA1C 5.5 08/27/2015   Lab Results  Component Value Date   INSULIN 6.9 08/22/2020   Lab Results  Component Value Date   TSH 0.01 (A) 01/09/2021   Lab Results  Component Value Date   CHOL 149 01/09/2021   HDL 46 01/09/2021   LDLCALC 89 01/09/2021   TRIG 69 01/09/2021   CHOLHDL 3.9 02/11/2017   Lab Results  Component Value Date   VD25OH 39.9 01/09/2021   VD25OH 30.4 08/22/2020   VD25OH 28 (L) 02/11/2017   Lab Results  Component Value Date   WBC 6.5 08/22/2020   HGB 14.9 08/22/2020   HCT 45.4 08/22/2020   MCV 87 08/22/2020   PLT 222 02/11/2017   No results found for: IRON, TIBC, FERRITIN  Attestation Statements:   Reviewed by clinician on day of visit: allergies, medications, problem list, medical history, surgical history, family history, social history, and previous encounter notes.  Time spent on visit including pre-visit chart review and post-visit care and  charting was 30 minutes.   I, Trixie Dredge, am acting as transcriptionist for Dennard Nip, MD.  I have reviewed the above documentation for accuracy and completeness, and I agree with the above. -  Dennard Nip, MD

## 2021-08-04 ENCOUNTER — Other Ambulatory Visit: Payer: Self-pay | Admitting: Sports Medicine

## 2021-08-05 ENCOUNTER — Encounter: Payer: Self-pay | Admitting: Sports Medicine

## 2021-08-06 ENCOUNTER — Other Ambulatory Visit: Payer: Self-pay | Admitting: Sports Medicine

## 2021-08-12 ENCOUNTER — Ambulatory Visit (INDEPENDENT_AMBULATORY_CARE_PROVIDER_SITE_OTHER): Payer: Medicare HMO | Admitting: Family Medicine

## 2021-08-12 ENCOUNTER — Encounter (INDEPENDENT_AMBULATORY_CARE_PROVIDER_SITE_OTHER): Payer: Self-pay | Admitting: Family Medicine

## 2021-08-12 VITALS — BP 111/68 | HR 73 | Temp 97.8°F | Ht 66.0 in | Wt 169.0 lb

## 2021-08-12 DIAGNOSIS — E669 Obesity, unspecified: Secondary | ICD-10-CM | POA: Diagnosis not present

## 2021-08-12 DIAGNOSIS — E559 Vitamin D deficiency, unspecified: Secondary | ICD-10-CM

## 2021-08-12 DIAGNOSIS — Z6827 Body mass index (BMI) 27.0-27.9, adult: Secondary | ICD-10-CM

## 2021-08-13 NOTE — Telephone Encounter (Signed)
[facility] said patient said she would call back to schedule. Patient has not yet done this. This referral was from 4/22. I called patient asking her to call urology for PT and call our office and let us know an update.

## 2021-08-13 NOTE — Telephone Encounter (Signed)
Patient called back stating she has not had any more pelvic pain since visit on 05/27/21. Patient said she did hear from PT urology. She does not wish to proceed with referral at this time since pain is gone. She would like referral closed.

## 2021-08-13 NOTE — Progress Notes (Signed)
Chief Complaint:   OBESITY [name] is here to discuss her progress with her obesity treatment plan along with follow-up of her obesity related diagnoses. [name] is on the Category 2 Plan and states she is following her eating plan approximately 40% of the time. [name] states she is doing 0 minutes 0 times per week.  Today's visit was #: 36 Starting weight: 188 lbs Starting date: 08/22/2020 Today's weight: 169 lbs Today's date: 08/12/2021 Total lbs lost to date: 19 Total lbs lost since last in-office visit: 0  Interim History: [name] went traveling to [place] and she did some indulging, but she was also very active. She is retaining fluid today.  Subjective:   1. Vitamin D deficiency [name] is on Vitamin D, with no side effects noted.  Assessment/Plan:   1. Vitamin D deficiency [name] will continue Vitamin D prescription as is, and we will recheck labs in 1-2 months.  2. Obesity, Current BMI 27.3 [name] is currently in the action stage of change. As such, her goal is to continue with weight loss efforts. She has agreed to the Category 2 Plan.   [name] is at a healthy weight, but she is low in muscle mass.   Exercise goals: Add chair yoga.  Behavioral modification strategies: decreasing simple carbohydrates and meal planning and cooking strategies.  [name] has agreed to follow-up with our clinic in 2 to 3 weeks. She was informed of the importance of frequent follow-up visits to maximize her success with intensive lifestyle modifications for her multiple health conditions.   Objective:   Blood pressure 111/68, pulse 73, temperature 97.8 F (36.6 C), height '5\' 6"'$  (1.676 m), weight 169 lb (76.7 kg), last menstrual period 02/25/1995, SpO2 95 %. Body mass index is 27.28 kg/m.  General: Cooperative, alert, well developed, in no acute distress. HEENT: Conjunctivae and lids unremarkable. Cardiovascular: Regular rhythm.  Lungs: Normal work of breathing. Neurologic: No focal deficits.    Lab Results  Component Value Date   CREATININE 0.55 (L) 08/22/2020   BUN 11 08/22/2020   NA 142 08/22/2020   K 4.7 08/22/2020   CL 101 08/22/2020   CO2 24 08/22/2020   Lab Results  Component Value Date   ALT 20 01/09/2021   AST 19 01/09/2021   ALKPHOS 91 08/22/2020   BILITOT 0.4 08/22/2020   Lab Results  Component Value Date   HGBA1C 5.3 01/09/2021   HGBA1C 5.8 (H) 08/22/2020   HGBA1C 5.5 08/27/2015   Lab Results  Component Value Date   INSULIN 6.9 08/22/2020   Lab Results  Component Value Date   TSH 0.01 (A) 01/09/2021   Lab Results  Component Value Date   CHOL 149 01/09/2021   HDL 46 01/09/2021   LDLCALC 89 01/09/2021   TRIG 69 01/09/2021   CHOLHDL 3.9 02/11/2017   Lab Results  Component Value Date   VD25OH 39.9 01/09/2021   VD25OH 30.4 08/22/2020   VD25OH 28 (L) 02/11/2017   Lab Results  Component Value Date   WBC 6.5 08/22/2020   HGB 14.9 08/22/2020   HCT 45.4 08/22/2020   MCV 87 08/22/2020   PLT 222 02/11/2017   No results found for: "IRON", "TIBC", "FERRITIN"  Attestation Statements:   Reviewed by clinician on day of visit: allergies, medications, problem list, medical history, surgical history, family history, social history, and previous encounter notes.  Time spent on visit including pre-visit chart review and post-visit care and charting was 30 minutes.   Clide Dales  Hassell Done, am acting as Location manager for Dennard Nip, MD.  I have reviewed the above documentation for accuracy and completeness, and I agree with the above. -  Dennard Nip, MD

## 2021-08-13 NOTE — Telephone Encounter (Signed)
Please close the referral.  

## 2021-08-15 ENCOUNTER — Other Ambulatory Visit: Payer: Self-pay | Admitting: Sports Medicine

## 2021-08-18 ENCOUNTER — Encounter (INDEPENDENT_AMBULATORY_CARE_PROVIDER_SITE_OTHER): Payer: Self-pay | Admitting: Family Medicine

## 2021-08-29 ENCOUNTER — Ambulatory Visit (INDEPENDENT_AMBULATORY_CARE_PROVIDER_SITE_OTHER): Payer: Medicare HMO | Admitting: Family Medicine

## 2021-09-05 ENCOUNTER — Ambulatory Visit (INDEPENDENT_AMBULATORY_CARE_PROVIDER_SITE_OTHER): Payer: Medicare HMO | Admitting: Family Medicine

## 2021-09-05 ENCOUNTER — Encounter (INDEPENDENT_AMBULATORY_CARE_PROVIDER_SITE_OTHER): Payer: Self-pay | Admitting: Family Medicine

## 2021-09-05 VITALS — BP 114/75 | HR 71 | Temp 98.0°F | Ht 66.0 in | Wt 170.0 lb

## 2021-09-05 DIAGNOSIS — Z6827 Body mass index (BMI) 27.0-27.9, adult: Secondary | ICD-10-CM

## 2021-09-05 DIAGNOSIS — E559 Vitamin D deficiency, unspecified: Secondary | ICD-10-CM | POA: Diagnosis not present

## 2021-09-05 DIAGNOSIS — E669 Obesity, unspecified: Secondary | ICD-10-CM

## 2021-09-05 MED ORDER — VITAMIN D (ERGOCALCIFEROL) 1.25 MG (50000 UNIT) PO CAPS: 50000.0000 [IU] | ORAL_CAPSULE | ORAL | 0 refills | Status: DC

## 2021-09-09 NOTE — Progress Notes (Unsigned)
Chief Complaint:   OBESITY [name] is here to discuss her progress with her obesity treatment plan along with follow-up of her obesity related diagnoses. [name] is on the Category 2 Plan and states she is following her eating plan approximately 20% of the time. [name] states she is doing 0 minutes 0 times per week.  Today's visit was #: 20 Starting weight: 188 lbs Starting date: 08/22/2020 Today's weight: 170 lbs Today's date: 09/05/2021 Total lbs lost to date: 18 Total lbs lost since last in-office visit: 0  Interim History: [name] has been sick recently and she has been on antibiotics and prednisone.  She is feeling better now, and she is back to exercising.  She is ready to get back on track with her weight loss efforts.  Subjective:   1. Vitamin D deficiency [name] is on vitamin D prescription, and she denies nausea, vomiting, or muscle weakness.  Assessment/Plan:   1. Vitamin D deficiency We will refill prescription Vitamin D 50,000 IU every week for 90 days, with no refills. [name] will follow-up for routine testing of Vitamin D, at least 2-3 times per year to avoid over-replacement.  - Vitamin D, Ergocalciferol, (DRISDOL) 1.25 MG (50000 UNIT) CAPS capsule; Take 1 capsule (50,000 Units total) by mouth every 7 (seven) days.  Dispense: 12 capsule; Refill: 0  2. Obesity, Current BMI 27.6 [name] is currently in the action stage of change. As such, her goal is to continue with weight loss efforts. She has agreed to keeping a food journal and adhering to recommended goals of 1000-1200 calories and 75+ grams of protein daily.   Behavioral modification strategies: increasing lean protein intake and meal planning and cooking strategies.  [name] has agreed to follow-up with our clinic in 2 to 3 weeks. She was informed of the importance of frequent follow-up visits to maximize her success with intensive lifestyle modifications for her multiple health conditions.   Objective:   Blood pressure  114/75, pulse 71, temperature 98 F (36.7 C), height '5\' 6"'$  (1.676 m), weight 170 lb (77.1 kg), last menstrual period 02/25/1995, SpO2 95 %. Body mass index is 27.44 kg/m.  General: Cooperative, alert, well developed, in no acute distress. HEENT: Conjunctivae and lids unremarkable. Cardiovascular: Regular rhythm.  Lungs: Normal work of breathing. Neurologic: No focal deficits.   Lab Results  Component Value Date   CREATININE 0.55 (L) 08/22/2020   BUN 11 08/22/2020   NA 142 08/22/2020   K 4.7 08/22/2020   CL 101 08/22/2020   CO2 24 08/22/2020   Lab Results  Component Value Date   ALT 20 01/09/2021   AST 19 01/09/2021   ALKPHOS 91 08/22/2020   BILITOT 0.4 08/22/2020   Lab Results  Component Value Date   HGBA1C 5.3 01/09/2021   HGBA1C 5.8 (H) 08/22/2020   HGBA1C 5.5 08/27/2015   Lab Results  Component Value Date   INSULIN 6.9 08/22/2020   Lab Results  Component Value Date   TSH 0.01 (A) 01/09/2021   Lab Results  Component Value Date   CHOL 149 01/09/2021   HDL 46 01/09/2021   LDLCALC 89 01/09/2021   TRIG 69 01/09/2021   CHOLHDL 3.9 02/11/2017   Lab Results  Component Value Date   VD25OH 39.9 01/09/2021   VD25OH 30.4 08/22/2020   VD25OH 28 (L) 02/11/2017   Lab Results  Component Value Date   WBC 6.5 08/22/2020   HGB 14.9 08/22/2020   HCT 45.4 08/22/2020   MCV 87 08/22/2020  PLT 222 02/11/2017   No results found for: "IRON", "TIBC", "FERRITIN"  Attestation Statements:   Reviewed by clinician on day of visit: allergies, medications, problem list, medical history, surgical history, family history, social history, and previous encounter notes.  I, Trixie Dredge, am acting as transcriptionist for Dennard Nip, MD.  I have reviewed the above documentation for accuracy and completeness, and I agree with the above. -  Dennard Nip, MD

## 2021-09-16 ENCOUNTER — Ambulatory Visit (INDEPENDENT_AMBULATORY_CARE_PROVIDER_SITE_OTHER): Payer: Medicare HMO | Admitting: Family Medicine

## 2021-09-19 ENCOUNTER — Encounter (INDEPENDENT_AMBULATORY_CARE_PROVIDER_SITE_OTHER): Payer: Self-pay | Admitting: Family Medicine

## 2021-09-19 ENCOUNTER — Ambulatory Visit (INDEPENDENT_AMBULATORY_CARE_PROVIDER_SITE_OTHER): Payer: Medicare HMO | Admitting: Family Medicine

## 2021-09-19 VITALS — BP 116/75 | HR 77 | Temp 98.0°F | Ht 66.0 in | Wt 169.0 lb

## 2021-09-19 DIAGNOSIS — E669 Obesity, unspecified: Secondary | ICD-10-CM | POA: Diagnosis not present

## 2021-09-19 DIAGNOSIS — E559 Vitamin D deficiency, unspecified: Secondary | ICD-10-CM | POA: Diagnosis not present

## 2021-09-19 DIAGNOSIS — Z6827 Body mass index (BMI) 27.0-27.9, adult: Secondary | ICD-10-CM

## 2021-09-25 NOTE — Progress Notes (Unsigned)
Chief Complaint:   OBESITY [name] is here to discuss her progress with her obesity treatment plan along with follow-up of her obesity related diagnoses. [name] is on keeping a food journal and adhering to recommended goals of 1000-1200 calories and 75+ grams of protein daily and states she is following her eating plan approximately 85% of the time. [name] states she is swimming for 6 hours per week.   Today's visit was #: 21 Starting weight: 188 lbs Starting date: 08/22/2020 Today's weight: 169 lbs Today's date: 09/19/2021 Total lbs lost to date: 19 Total lbs lost since last in-office visit: 1  Interim History: [name] continues to do well with weight loss. She continues to work on increasing her protein. She is sleeping well and exercising regularly.   Subjective:   1. Vitamin D deficiency [name] is stable on Vitamin D, but her last level was not yet at goal.   Assessment/Plan:   1. Vitamin D deficiency [name] will continue Vitamin D and Ca+ rich diet as is, and we will continue to follow.   2. Obesity, Current BMI 27.3 [name] is currently in the action stage of change. As such, her goal is to continue with weight loss efforts. She has agreed to keeping a food journal and adhering to recommended goals of 1000-1200 calories and 75+ grams of protein daily.   Exercise goals: As is.   Behavioral modification strategies: increasing lean protein intake, increasing water intake, and meal planning and cooking strategies.  [name] has agreed to follow-up with our clinic in 2 to 3 weeks. She was informed of the importance of frequent follow-up visits to maximize her success with intensive lifestyle modifications for her multiple health conditions.   Objective:   Blood pressure 116/75, pulse 77, temperature 98 F (36.7 C), height '5\' 6"'$  (1.676 m), weight 169 lb (76.7 kg), last menstrual period 02/25/1995, SpO2 95 %. Body mass index is 27.28 kg/m.  General: Cooperative, alert, well developed, in no  acute distress. HEENT: Conjunctivae and lids unremarkable. Cardiovascular: Regular rhythm.  Lungs: Normal work of breathing. Neurologic: No focal deficits.   Lab Results  Component Value Date   CREATININE 0.55 (L) 08/22/2020   BUN 11 08/22/2020   NA 142 08/22/2020   K 4.7 08/22/2020   CL 101 08/22/2020   CO2 24 08/22/2020   Lab Results  Component Value Date   ALT 20 01/09/2021   AST 19 01/09/2021   ALKPHOS 91 08/22/2020   BILITOT 0.4 08/22/2020   Lab Results  Component Value Date   HGBA1C 5.3 01/09/2021   HGBA1C 5.8 (H) 08/22/2020   HGBA1C 5.5 08/27/2015   Lab Results  Component Value Date   INSULIN 6.9 08/22/2020   Lab Results  Component Value Date   TSH 0.01 (A) 01/09/2021   Lab Results  Component Value Date   CHOL 149 01/09/2021   HDL 46 01/09/2021   LDLCALC 89 01/09/2021   TRIG 69 01/09/2021   CHOLHDL 3.9 02/11/2017   Lab Results  Component Value Date   VD25OH 39.9 01/09/2021   VD25OH 30.4 08/22/2020   VD25OH 28 (L) 02/11/2017   Lab Results  Component Value Date   WBC 6.5 08/22/2020   HGB 14.9 08/22/2020   HCT 45.4 08/22/2020   MCV 87 08/22/2020   PLT 222 02/11/2017   No results found for: "IRON", "TIBC", "FERRITIN"  Attestation Statements:   Reviewed by clinician on day of visit: allergies, medications, problem list, medical history, surgical history, family history, social  history, and previous encounter notes.  Time spent on visit including pre-visit chart review and post-visit care and charting was 27 minutes.   I, Trixie Dredge, am acting as transcriptionist for Dennard Nip, MD.  I have reviewed the above documentation for accuracy and completeness, and I agree with the above. -  Dennard Nip, MD

## 2021-09-30 ENCOUNTER — Encounter: Payer: Self-pay | Admitting: Sports Medicine

## 2021-10-02 ENCOUNTER — Institutional Professional Consult (permissible substitution): Payer: No Typology Code available for payment source | Admitting: Emergency Medicine

## 2021-10-02 ENCOUNTER — Encounter (INDEPENDENT_AMBULATORY_CARE_PROVIDER_SITE_OTHER): Payer: Self-pay

## 2021-10-03 ENCOUNTER — Encounter (INDEPENDENT_AMBULATORY_CARE_PROVIDER_SITE_OTHER): Payer: Self-pay | Admitting: Family Medicine

## 2021-10-03 ENCOUNTER — Ambulatory Visit (INDEPENDENT_AMBULATORY_CARE_PROVIDER_SITE_OTHER): Payer: Medicare HMO | Admitting: Family Medicine

## 2021-10-03 ENCOUNTER — Institutional Professional Consult (permissible substitution): Payer: No Typology Code available for payment source | Admitting: Emergency Medicine

## 2021-10-03 VITALS — BP 112/76 | HR 80 | Temp 97.9°F | Ht 66.0 in | Wt 169.0 lb

## 2021-10-03 DIAGNOSIS — Z6827 Body mass index (BMI) 27.0-27.9, adult: Secondary | ICD-10-CM | POA: Diagnosis not present

## 2021-10-03 DIAGNOSIS — M545 Low back pain, unspecified: Secondary | ICD-10-CM | POA: Diagnosis not present

## 2021-10-03 DIAGNOSIS — E669 Obesity, unspecified: Secondary | ICD-10-CM | POA: Diagnosis not present

## 2021-10-03 DIAGNOSIS — E559 Vitamin D deficiency, unspecified: Secondary | ICD-10-CM

## 2021-10-03 MED ORDER — VITAMIN D (ERGOCALCIFEROL) 1.25 MG (50000 UNIT) PO CAPS: 50000.0000 [IU] | ORAL_CAPSULE | ORAL | 0 refills | Status: DC

## 2021-10-10 ENCOUNTER — Ambulatory Visit (INDEPENDENT_AMBULATORY_CARE_PROVIDER_SITE_OTHER): Payer: Medicare HMO | Admitting: Sports Medicine

## 2021-10-10 VITALS — BP 105/61 | Wt 173.0 lb

## 2021-10-10 DIAGNOSIS — M5030 Other cervical disc degeneration, unspecified cervical region: Secondary | ICD-10-CM

## 2021-10-10 DIAGNOSIS — M545 Low back pain, unspecified: Secondary | ICD-10-CM | POA: Diagnosis not present

## 2021-10-10 DIAGNOSIS — M542 Cervicalgia: Secondary | ICD-10-CM

## 2021-10-10 MED ORDER — DOXEPIN HCL 100 MG PO CAPS: 100.0000 mg | ORAL_CAPSULE | Freq: Every day | ORAL | 1 refills | Status: DC

## 2021-10-10 MED ORDER — METHOCARBAMOL 500 MG PO TABS: 500.0000 mg | ORAL_TABLET | Freq: Four times a day (QID) | ORAL | 1 refills | Status: AC

## 2021-10-10 MED ORDER — DOXEPIN HCL 50 MG PO CAPS: ORAL_CAPSULE | ORAL | 1 refills | Status: DC

## 2021-10-10 MED ORDER — TRAMADOL HCL 50 MG PO TABS: 50.0000 mg | ORAL_TABLET | Freq: Three times a day (TID) | ORAL | 4 refills | Status: DC

## 2021-10-10 NOTE — Progress Notes (Signed)
  [name] - 81 y.o. female MRN [id number]  Date of birth: [birth year]    CHIEF COMPLAINT:   Back Pain    SUBJECTIVE:   HPI:  Pleasant 81 year old female here for evaluation of right-sided low back pain.  This has been bothering her for about 2 weeks.  She had some dinner guests over and did a lot of lifting and bending over to clean and do the dishes, then later that night she felt a "knot" in the right lower lumbar area.  She has been taking Robaxin, tramadol, and doxepin which has provided her significant relief.  Today she still feels tightness in the right lower lumbar back.  She denies any radiation of the pain down her legs.  She denies any numbness or tingling in the legs.  ROS:     See HPI  PERTINENT  PMH / PSH FH / / SH:  Past Medical, Surgical, Social, and Family History Reviewed & Updated in the EMR.  Pertinent findings include:  Spondylosis of cervical region  OBJECTIVE: LMP 02/25/1995 (Approximate)   Physical Exam:  Vital signs are reviewed.  GEN: Alert and oriented, NAD Pulm: Breathing unlabored PSY: normal mood, congruent affect  MSK: Lumbar spine -no obvious deformity on inspection.  Palpation no tenderness to palpation over lumbar spinous processes, no bony step-off.  Mildly tender to palpation right lumbar paraspinal musculature.  Full range of motion in flexion, extension, and side bending and twisting.  Negative straight leg raise bilaterally.  Positive Stinchfield test bilaterally.  ASSESSMENT & PLAN:  1. Right Sided Acute Low Back Pain -Clinical picture most consistent with muscle spasm of right quadratus lumborum.  No red flags on exam.  No radiation of symptoms.  Will treat with conservative measures including heat, stretches, pool exercises, walking 5 to 10 minutes at a time daily.  We will also refill her prescription for Robaxin.  All this was discussed with the patient and she voiced understanding and agreed with plan.    I observed and examined the  patient with the resident and agree with assessment and plan.  Note reviewed and modified by me. Ila Mcgill, MD

## 2021-10-14 NOTE — Progress Notes (Unsigned)
Chief Complaint:   OBESITY [name] is here to discuss her progress with her obesity treatment plan along with follow-up of her obesity related diagnoses. [name] is on keeping a food journal and adhering to recommended goals of 1000-1200 calories and 75+ grams of protein daily and states she is following her eating plan approximately 50% of the time. [name] states she was doing pool exercise for 60 minutes 5 times per week.  Today's visit was #: 22 Starting weight: 188 lbs Starting date: 08/22/2020 Today's weight: 169 lbs Today's date: 10/03/2021 Total lbs lost to date: 19 Total lbs lost since last in-office visit: 0  Interim History: [name] had company recently and she has not been able to concentrate on weight loss.  She has done well with maintaining her weight however.  She is limited on exercise due to back pain.  Subjective:   1. Vitamin D deficiency [name] is on vitamin D, with no side effects noted.  2. Low back pain, unspecified back pain laterality, unspecified chronicity, unspecified whether sciatica present [name] has worsening back pain after being more active.  She denies falls or trauma recently.  This is limiting her exercise.  Assessment/Plan:   1. Vitamin D deficiency We will refill prescription Vitamin D for 1 month. [name] will follow-up for routine testing of Vitamin D, at least 2-3 times per year to avoid over-replacement.  - Vitamin D, Ergocalciferol, (DRISDOL) 1.25 MG (50000 UNIT) CAPS capsule; Take 1 capsule (50,000 Units total) by mouth every 7 (seven) days.  Dispense: 4 capsule; Refill: 0  2. Low back pain, unspecified back pain laterality, unspecified chronicity, unspecified whether sciatica present [name] is to be careful with exercise, and she will follow-up with Sports Medicine.   3. Obesity, Current BMI 27.4 [name] is currently in the action stage of change. As such, her goal is to continue with weight loss efforts. She has agreed to keeping a food journal and  adhering to recommended goals of 1000-1200 calories and 75+ grams of protein daily.   Behavioral modification strategies: increasing lean protein intake.  [name] has agreed to follow-up with our clinic in 2 weeks. She was informed of the importance of frequent follow-up visits to maximize her success with intensive lifestyle modifications for her multiple health conditions.   Objective:   Blood pressure 112/76, pulse 80, temperature 97.9 F (36.6 C), height '5\' 6"'$  (1.676 m), weight 169 lb (76.7 kg), last menstrual period 02/25/1995, SpO2 94 %. Body mass index is 27.28 kg/m.  General: Cooperative, alert, well developed, in no acute distress. HEENT: Conjunctivae and lids unremarkable. Cardiovascular: Regular rhythm.  Lungs: Normal work of breathing. Neurologic: No focal deficits.   Lab Results  Component Value Date   CREATININE 0.55 (L) 08/22/2020   BUN 11 08/22/2020   NA 142 08/22/2020   K 4.7 08/22/2020   CL 101 08/22/2020   CO2 24 08/22/2020   Lab Results  Component Value Date   ALT 20 01/09/2021   AST 19 01/09/2021   ALKPHOS 91 08/22/2020   BILITOT 0.4 08/22/2020   Lab Results  Component Value Date   HGBA1C 5.3 01/09/2021   HGBA1C 5.8 (H) 08/22/2020   HGBA1C 5.5 08/27/2015   Lab Results  Component Value Date   INSULIN 6.9 08/22/2020   Lab Results  Component Value Date   TSH 0.01 (A) 01/09/2021   Lab Results  Component Value Date   CHOL 149 01/09/2021   HDL 46 01/09/2021   LDLCALC 89 01/09/2021  TRIG 69 01/09/2021   CHOLHDL 3.9 02/11/2017   Lab Results  Component Value Date   VD25OH 39.9 01/09/2021   VD25OH 30.4 08/22/2020   VD25OH 28 (L) 02/11/2017   Lab Results  Component Value Date   WBC 6.5 08/22/2020   HGB 14.9 08/22/2020   HCT 45.4 08/22/2020   MCV 87 08/22/2020   PLT 222 02/11/2017   No results found for: "IRON", "TIBC", "FERRITIN"  Attestation Statements:   Reviewed by clinician on day of visit: allergies, medications, problem list,  medical history, surgical history, family history, social history, and previous encounter notes.   I, Trixie Dredge, am acting as transcriptionist for Dennard Nip, MD.  I have reviewed the above documentation for accuracy and completeness, and I agree with the above. -  Dennard Nip, MD

## 2021-10-22 ENCOUNTER — Ambulatory Visit (INDEPENDENT_AMBULATORY_CARE_PROVIDER_SITE_OTHER): Payer: Medicare HMO | Admitting: Family Medicine

## 2021-10-22 VITALS — BP 109/70 | HR 77 | Temp 98.1°F | Ht 66.0 in | Wt 169.0 lb

## 2021-10-22 DIAGNOSIS — M545 Low back pain, unspecified: Secondary | ICD-10-CM | POA: Diagnosis not present

## 2021-10-22 DIAGNOSIS — Z6827 Body mass index (BMI) 27.0-27.9, adult: Secondary | ICD-10-CM

## 2021-10-22 DIAGNOSIS — E669 Obesity, unspecified: Secondary | ICD-10-CM | POA: Diagnosis not present

## 2021-10-29 NOTE — Progress Notes (Signed)
Chief Complaint:   OBESITY [name] is here to discuss her progress with her obesity treatment plan along with follow-up of her obesity related diagnoses. [name] is on keeping a food journal and adhering to recommended goals of 1000-1200 calories and 75+ grams of protein daily and states she is following her eating plan approximately 85% of the time. [name] states she is swimming and walking for 60 minutes 6 times per week.  Today's visit was #: 23 Starting weight: 188 lbs Starting date: 08/22/2020 Today's weight: 169 lbs Today's date: 10/22/2021 Total lbs lost to date: 19 Total lbs lost since last in-office visit: 0  Interim History: [name] has done well with maintaining her weight. She would like to lose another 5 lbs. She is eating healthy but she may not be meeting her protein goals.   Subjective:   1. Low back pain, unspecified back pain laterality, unspecified chronicity, unspecified whether sciatica present [name] is limited in exercise due to pain. She is doing very well with her water aerobics.   Assessment/Plan:   1. Low back pain, unspecified back pain laterality, unspecified chronicity, unspecified whether sciatica present [name] is to continue Robaxin as needed and will continue with weight loss to help treat her back pain.   2. Obesity, Current BMI 27.4 [name] is currently in the action stage of change. As such, her goal is to continue with weight loss efforts. She has agreed to keeping a food journal and adhering to recommended goals of 1000-1200 calories and 75+ grams of protein daily.   Exercise goals: As is.   Behavioral modification strategies: increasing lean protein intake and meal planning and cooking strategies.  [name] has agreed to follow-up with our clinic in 2 to 3 weeks. She was informed of the importance of frequent follow-up visits to maximize her success with intensive lifestyle modifications for her multiple health conditions.   Objective:   Blood pressure  109/70, pulse 77, temperature 98.1 F (36.7 C), height '5\' 6"'$  (1.676 m), weight 169 lb (76.7 kg), last menstrual period 02/25/1995, SpO2 95 %. Body mass index is 27.28 kg/m.  General: Cooperative, alert, well developed, in no acute distress. HEENT: Conjunctivae and lids unremarkable. Cardiovascular: Regular rhythm.  Lungs: Normal work of breathing. Neurologic: No focal deficits.   Lab Results  Component Value Date   CREATININE 0.55 (L) 08/22/2020   BUN 11 08/22/2020   NA 142 08/22/2020   K 4.7 08/22/2020   CL 101 08/22/2020   CO2 24 08/22/2020   Lab Results  Component Value Date   ALT 20 01/09/2021   AST 19 01/09/2021   ALKPHOS 91 08/22/2020   BILITOT 0.4 08/22/2020   Lab Results  Component Value Date   HGBA1C 5.3 01/09/2021   HGBA1C 5.8 (H) 08/22/2020   HGBA1C 5.5 08/27/2015   Lab Results  Component Value Date   INSULIN 6.9 08/22/2020   Lab Results  Component Value Date   TSH 0.01 (A) 01/09/2021   Lab Results  Component Value Date   CHOL 149 01/09/2021   HDL 46 01/09/2021   LDLCALC 89 01/09/2021   TRIG 69 01/09/2021   CHOLHDL 3.9 02/11/2017   Lab Results  Component Value Date   VD25OH 39.9 01/09/2021   VD25OH 30.4 08/22/2020   VD25OH 28 (L) 02/11/2017   Lab Results  Component Value Date   WBC 6.5 08/22/2020   HGB 14.9 08/22/2020   HCT 45.4 08/22/2020   MCV 87 08/22/2020   PLT 222 02/11/2017  No results found for: "IRON", "TIBC", "FERRITIN"  Attestation Statements:   Reviewed by clinician on day of visit: allergies, medications, problem list, medical history, surgical history, family history, social history, and previous encounter notes.   I, Trixie Dredge, am acting as transcriptionist for Dennard Nip, MD.  I have reviewed the above documentation for accuracy and completeness, and I agree with the above. -  Dennard Nip, MD

## 2021-10-31 ENCOUNTER — Other Ambulatory Visit: Payer: Self-pay | Admitting: Neurology

## 2021-10-31 ENCOUNTER — Encounter: Payer: Self-pay | Admitting: Neurology

## 2021-10-31 ENCOUNTER — Ambulatory Visit (INDEPENDENT_AMBULATORY_CARE_PROVIDER_SITE_OTHER): Payer: Medicare HMO | Admitting: Neurology

## 2021-10-31 VITALS — BP 115/74 | HR 80 | Ht 66.0 in | Wt 175.0 lb

## 2021-10-31 DIAGNOSIS — Z9989 Dependence on other enabling machines and devices: Secondary | ICD-10-CM

## 2021-10-31 DIAGNOSIS — G478 Other sleep disorders: Secondary | ICD-10-CM | POA: Diagnosis not present

## 2021-10-31 DIAGNOSIS — G4733 Obstructive sleep apnea (adult) (pediatric): Secondary | ICD-10-CM | POA: Diagnosis not present

## 2021-10-31 DIAGNOSIS — M542 Cervicalgia: Secondary | ICD-10-CM | POA: Diagnosis not present

## 2021-10-31 DIAGNOSIS — G44221 Chronic tension-type headache, intractable: Secondary | ICD-10-CM

## 2021-10-31 NOTE — Patient Instructions (Signed)

## 2021-10-31 NOTE — Progress Notes (Signed)
SLEEP MEDICINE CLINIC   Provider:  Larey Seat, M D  Primary Care Physician:  Reynold Bowen, MD   Referring Provider: Reynold Bowen, MD   Chief Complaint  Patient presents with   Follow-up    pt alone, rm 11. pt states that  CPAP is doing well. she uses it nightly.  DME [facility]. she had covid in Jan 2021 and her husband contracted COVId again while on [place] 9-23- through 10-15 -22 .  Overall states doing well with cpap. Current DME [facility] and would like to switch DME. I have everything I need to TOC to [facility] and can assist with that 01/2017 was her current set up date. Pt is eligible for a new machine upcoming in December. She sleeps well on Doxepin and had no migraines since.       10-31-2021: RV for  [name] is a 81 y.o. 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  She had a COVID infection in January 2021 and has had headaches since that - doxepin h as finally helped  . New CPAP ordered.  Marland Kitchen      09-18-2020,Ubrelvy was prescribed by Dr Forde Dandy and has helped greatly.  Some weeks are headache free and others have 2-3 migraines. She has not been on preventives-  she has thoughts about a dental device, but feels that CPAP has served her well.  She has worsening hearing loss.          2021   The headaches are associated with photophobia and nausea.  She has been told her cervical disc degeneration is at cause.  She did not respond to trigger point injection. She felt more sore- bee-sting- like Lost her sister in law to [place] , just this month.  She has been on CPAP for UARS, OSA . She is doing better on Doxepin and Toradol and her neck hurt less.  Uses Lunesta - per Dr Casimiro Needle. Dr.Beasley did blood work ay [employer] and wellness.  CPAP compliance: [name] has used her machine 93% of days 28 out of 30 days with an average use at time of 8 hours and 30 minutes.  She is using an AutoSet between 5 and 11 cmH2O was 2  cm expiratory pressure relief.  Residual AHI is 3.0, she has few central apneas emerging.  Her CPAP is now 82.81 years old she also has a travel CPAP which recently broke I will write today for a new travel CPAP prescription for the CPAP to be set to 10 cmH2O was 2 cm EPR the mask that she is using for her regular CPAP should be duplicated there.   The Epworth sleepiness score was endorsed at 2 points out of 24 which is excellent fatigue severity was not endorsed today and geriatric depression score was 6 out of 15 points and she is followed by Dr. Casimiro Needle for depression.   06-15-2019:  She was told by Andreas Blower, MD, to treat these aggressively - Doxepin at 100 mg at night- but she still has headaches.  When I encountered [name] last in October 2020 she had actually been headache free for a while after starting on doxepin at a lower dose.  She feels well exhausted, feels a complete lack of energy.  Outdoor activities just walking outdoors also are aggravating her allergies.  She has been a compliant CPAP user as usual with over 90% compliance and average use at time of 8 hours and  40 minutes with a minimum pressure of 5, a maximum pressure setting of 11 cmH2O and 2 cm EPR and a residual AHI of only 1.2.  Her air leaks are low and her pressure at the 91st percentile is 9.5.  There needs to be no adjustment done for the CPAP part.  Her fatigue has exacerbated she endorsed a fatigue score of 41, but she is not excessively daytime sleepy.  She reports loss of taste and smell.  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  She endorsed depression of 4 out of 15 - so not a clinical level of concern.    Interval 11/2018 [name] reports improvement in headaches after Dr. Oneida Alar has started her on doxepin. She sleeps well, her headaches are gone. CPAP : She went to [place] and used machine every single night.  EPWORTH 1. [name] has used her machine daily for the last 30 days including 08 December 2018,  average use at time 8 hours 58 minutes, her minimum pressure setting is for maximum pressure setting 10 cmH2O this 2 cm expiratory pressure relief and a resulting AHI of 2.5 there is no air leak.  The resulting AHI is mostly related to obstructive apneas.  The 95th percentile pressure is at 9.2 cmH2O, which makes me think that we need to increase her maximum pressure setting x1 cm the new window will be for up to 11 cmH2O is 2 cm EPR.    08-11-2018:[name] is a 81 y.o. female , seen here for a new concern about a recent fall- retropusive, hit her head on a paved brick pavement, all this happened on June 6th at her husband's high school reunion in [place], [place].  Patient reports that she initially felt not as much pain, she has spit up by herself went to her bedroom and rested, but after 2 or 3 days she developed a headache associated with some sickness to her stomach and some photophobia but must be migrainous in origin.  She did not break any bones, her pupils are of similar size, and she does not have a focal weakness or sensory abnormality. She hasn't had a migraine for almost one year.  She is not dizzy- but walks with sunglasses.  She needs some intervention  for her migraine and a CT of the had to rule out any bleed.   04-15-2017, I have the pleasure of meeting with [name] today, for a regular CPAP CPAP compliance visit.  The patient now uses an AutoSet between 4 and 10 cmH2O pressure was 1 cm EPR, she has achieved a compliance of 100% with an average use at time of 9 hours and 10 minutes each night.  Her residual AHI is 1.5/h, which is a very good resolution.  95th percentile pressure is 9.7 cm water air leaks are medium. Epworth 2, FSS 41.  She loves the new CPAP auto- machine.  Here is the result of her sleep study:   NAME:  [name]                                                      DOB: Feb 10, 1941 MEDICAL RECORD NUMBER [id number]  DOS:  01/31/2017 REFERRING PHYSICIAN: Hollace Kinnier, D.O. BMI: 30.6  STUDY RESULTS: Total Recording Time: 10 hours, 51 minutes,  Total Apnea/Hypopnea Index (AHI):   8.2/ hr. RDI 10.5 /hr.  Average Oxygen Saturation:    92 % Lowest Oxygen Desaturation:  82 %; Total Time Oxygen Saturation below 89%:  19 minutes Average Heart Rate:   64 bpm (40-161 bpm).     Social History   Socioeconomic History   Marital status: Married    Spouse name: [name]   Number of children: 0   Years of education: 14   Highest education level: Not on file  Occupational History   Occupation: Retired Catering manager  Tobacco Use   Smoking status: Never    Passive exposure: Yes   Smokeless tobacco: Never   Tobacco comments:    flight attendant on smoking plane   Vaping Use   Vaping Use: Never used  Substance and Sexual Activity   Alcohol use: Yes    Alcohol/week: 0.0 standard drinks of alcohol    Comment: 1 glass - occas of vodka   Drug use: No   Sexual activity: Not Currently    Partners: Male    Birth control/protection: Post-menopausal  Other Topics Concern   Not on file  Social History Narrative   Patient is married [name]).   Patient drinks 2-4 cups of coffee daily.   Patient is retired.   Patient has two years of college.   Patient is right-handed.               Social Determinants of Health   Financial Resource Strain: Low Risk  (10/28/2017)   Overall Financial Resource Strain (CARDIA)    Difficulty of Paying Living Expenses: Not hard at all  Food Insecurity: No Food Insecurity (10/28/2017)   Hunger Vital Sign    Worried About Running Out of Food in the Last Year: Never true    Ran Out of Food in the Last Year: Never true  Transportation Needs: No Transportation Needs (10/28/2017)   PRAPARE - Hydrologist (Medical): No    Lack of Transportation (Non-Medical): No  Physical Activity: Sufficiently Active (10/28/2017)   Exercise Vital Sign    Days of Exercise per  Week: 4 days    Minutes of Exercise per Session: 60 min  Stress: No Stress Concern Present (10/28/2017)   [place]    Feeling of Stress : Only a little  Social Connections: Moderately Integrated (10/28/2017)   Social Connection and Isolation Panel [NHANES]    Frequency of Communication with Friends and Family: More than three times a week    Frequency of Social Gatherings with Friends and Family: More than three times a week    Attends Religious Services: More than 4 times per year    Active Member of [employer] or Organizations: No    Attends Archivist Meetings: Never    Marital Status: Married  Human resources officer Violence: Not At Risk (10/28/2017)   Humiliation, Afraid, Rape, and Kick questionnaire    Fear of Current or Ex-Partner: No    Emotionally Abused: No    Physically Abused: No    Sexually Abused: No     Vitals: BP 115/74   Pulse 80   Ht '5\' 6"'$  (1.676 m)   Wt 175 lb (79.4 kg)   LMP 02/25/1995 (Approximate)   BMI 28.25 kg/m  Last Weight:  Wt Readings from Last 1 Encounters:  10/31/21 175 lb (79.4 kg)   PZW:CHEN mass index is 28.25 kg/m.     Last Height:   Ht Readings from Last 1 Encounters:  10/31/21 '5\' 6"'$  (1.676 m)    Physical exam: The patient feels hot to the touch and is flushed. There is a tremor in both hands.   General: The patient is awake, alert and appears not in acute distress. The patient is well groomed. Head: Normocephalic, atraumatic. Neck is supple. Mallampati 4  neck circumference:15.5 . Nasal airflow patent.  Cardiovascular:  Regular rate and rhythm , without  murmurs or carotid bruit, and without distended neck veins. Respiratory: Lungs are clear to auscultation. Skin:  Without evidence of edema, or rash Trunk: BMI is  30.18- .   Neurologic exam : The patient is awake and alert, oriented to place and time.   MOCA:     No data to display         MMSE:    10/28/2017     3:25 PM 09/23/2016    2:25 PM 08/31/2015    9:00 AM  MMSE - Mini Mental State Exam  Orientation to time '5 5 4  '$ Orientation to Place '5 5 5  '$ Registration '3 3 3  '$ Attention/ Calculation '5 5 4  '$ Recall '2 2 3  '$ Language- name 2 objects '2 2 2  '$ Language- repeat '1 1 1  '$ Language- follow 3 step command '3 3 1  '$ Language- read & follow direction '1 1 1  '$ Write a sentence '1 1 1  '$ Copy design '1 1 1  '$ Total score '29 29 26      '$ Attention span & concentration ability appears normal.  Speech is fluent, Mood and affect are appropriate.  Cranial nerves: Pupils are equal and briskly reactive to light. Funduscopic exam without evidence of pallor or edema. Extraocular movements  in vertical and horizontal planes intact and without nystagmus. Visual fields by finger perimetry are intact. Hearing corrected with hearing  aids.  Facial sensation intact to fine touch. Facial motor strength is symmetric and tongue and uvula move midline. Shoulder shrug was asymmetrical.   Neck and shoulders are stiff.- extremely tense. Tension headaches, neck pain-   Motor exam:  Normal tone, muscle bulk and symmetric strength in all extremities. Sensory:  Fine touch, pinprick and vibration were tested in all extremities. Proprioception tested in the upper extremities was normal. Coordination: Rapid alternating movements in the fingers/hands was normal.  Finger-to-nose maneuver  normal without evidence of ataxia, dysmetria or tremor. Gait and station: Patient walks without assistive device . Deep tendon reflexes: in the  upper and lower extremities are symmetric and intact. I avoided the patella.  Babinski maneuver response is downgoing.   Assessment:  After physical and neurologic examination, review of laboratory studies,  Personal review of imaging studies, reports of other /same  Imaging studies, results of polysomnography and / or neurophysiology testing and pre-existing records as far as provided in visit;   OSA / UARS:  Compliance to CPAP recorded:  I again  reviewed the patient's 30-day compliance she has used her machine 100% of 30 out of 30 days aHer AutoSet offers a range between 5 and 11 cmH2O pressure was 2 cm water EPR.   95th percentile pressure used is 9.2 cmH2O there are no major air leaks and there were no central apneas arising.  Residual AHI is 1.9/h.   Set up for the machine was 12-6 2018 which means that she will be eligible for new machine  at the end of this year.  At the end at the end of this year 2023 she will return for her for a home sleep test and based on the results on the results we will offer her on the results we will offer her a new machine which likely will again be an autotitrator with similar settings similar settings.  She does like her current mask.     IMPRESSION:    0) OSA  on CPAP - I will renew her machine order.  At the end at the end of this year 2023 she will return for her for a home sleep test and based on the results on the results we will offer her on the results we will offer her a new machine which likely will again be an autotitrator with similar settings similar settings.   She does like her current mask.  1) Migraine: Dr Forde Dandy has prescribed UBRELVY to which she responded well. Has to keep a headache diary and we may add Ajovy.   2)Severe neck tension and known DDD, restricted ROM for retroflexion/ flexion at the neck.  3) Mild sleep apnea with snoring, desaturation and heart rate variability =baseline  AHI 8.2/hr .UARS was evident.  Continued CPAP therapy is indicated.  She will need a new travel CPAP. Set to 10 cm water 2 cm EPR.  I ordered  Auto CPAP 4-10 cm water, 3 cm EPR, heated humidity and fit for dream wear interface.    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   I spent more than 28 minutes of face to face time with the patient. Greater than 50% of time was spent in counseling and coordination of care. We have discussed the  diagnosis and differential and I answered the patient's questions.    Plan:  Treatment plan and additional workup : continue using CPAP with the current settings, RV in 6 month.    Larey Seat, MD   03/31/9561, 8:75 PM  Certified in Neurology by ABPN Certified in [place] by [facility] [address], [place], [place] [address]

## 2021-11-02 ENCOUNTER — Other Ambulatory Visit (INDEPENDENT_AMBULATORY_CARE_PROVIDER_SITE_OTHER): Payer: Self-pay | Admitting: Family Medicine

## 2021-11-02 ENCOUNTER — Encounter (INDEPENDENT_AMBULATORY_CARE_PROVIDER_SITE_OTHER): Payer: Self-pay | Admitting: Family Medicine

## 2021-11-02 DIAGNOSIS — E559 Vitamin D deficiency, unspecified: Secondary | ICD-10-CM

## 2021-11-04 MED ORDER — VITAMIN D (ERGOCALCIFEROL) 1.25 MG (50000 UNIT) PO CAPS: 50000.0000 [IU] | ORAL_CAPSULE | ORAL | 0 refills | Status: DC

## 2021-11-06 ENCOUNTER — Encounter: Payer: Self-pay | Admitting: Neurology

## 2021-11-07 ENCOUNTER — Ambulatory Visit (INDEPENDENT_AMBULATORY_CARE_PROVIDER_SITE_OTHER): Payer: Medicare HMO | Admitting: Family Medicine

## 2021-12-17 ENCOUNTER — Encounter (INDEPENDENT_AMBULATORY_CARE_PROVIDER_SITE_OTHER): Payer: Self-pay | Admitting: Family Medicine

## 2021-12-17 ENCOUNTER — Ambulatory Visit (INDEPENDENT_AMBULATORY_CARE_PROVIDER_SITE_OTHER): Payer: Medicare HMO | Admitting: Family Medicine

## 2021-12-17 VITALS — BP 121/74 | HR 80 | Temp 97.7°F | Ht 66.0 in | Wt 176.0 lb

## 2021-12-17 DIAGNOSIS — R609 Edema, unspecified: Secondary | ICD-10-CM | POA: Diagnosis not present

## 2021-12-17 DIAGNOSIS — E669 Obesity, unspecified: Secondary | ICD-10-CM

## 2021-12-17 DIAGNOSIS — E559 Vitamin D deficiency, unspecified: Secondary | ICD-10-CM

## 2021-12-17 DIAGNOSIS — Z6828 Body mass index (BMI) 28.0-28.9, adult: Secondary | ICD-10-CM

## 2021-12-17 MED ORDER — VITAMIN D (ERGOCALCIFEROL) 1.25 MG (50000 UNIT) PO CAPS: 50000.0000 [IU] | ORAL_CAPSULE | ORAL | 0 refills | Status: DC

## 2021-12-23 NOTE — Progress Notes (Unsigned)
Chief Complaint:   OBESITY [name] is here to discuss her progress with her obesity treatment plan along with follow-up of her obesity related diagnoses. [name] is on keeping a food journal and adhering to recommended goals of 1000-1200 calories and 75+ grams of protein daily and states she is following her eating plan approximately 40% of the time. [name] states she is walking 14,000 steps 7 times per week.    Today's visit was #: 24 Starting weight: 188 lbs Starting date: 08/22/2020 Today's weight: 176 lbs Today's date: 12/17/2021 Total lbs lost to date: 12 Total lbs lost since last in-office visit: 0  Interim History: [name] had a COVID shot and she felt poorly for approximately 7 days. She then went to [place] and she did some celebration eating. She is working on getting back on tack.   Subjective:   1. Fluid retention [name] was up 14 lbs after her vacation and she has lost 1/2 of this but her ankles are swollen.   2. Vitamin D deficiency [name] is on Vitamin D, and she requests a refill. She denies nausea, vomiting, or muscle weakness.   Assessment/Plan:   1. Fluid retention [name] will continue to increase her water intake and decrease sodium. We will continue to follow, she may need compression stockings.   2. Vitamin D deficiency We will refill prescription Vitamin D for 90 days. [name] will follow-up for routine testing of Vitamin D, at least 2-3 times per year to avoid over-replacement.  - Vitamin D, Ergocalciferol, (DRISDOL) 1.25 MG (50000 UNIT) CAPS capsule; Take 1 capsule (50,000 Units total) by mouth every 7 (seven) days.  Dispense: 13 capsule; Refill: 0  3. Obesity, Current BMI 28.4 [name] is currently in the action stage of change. As such, her goal is to continue with weight loss efforts. She has agreed to keeping a food journal and adhering to recommended goals of 1000-1200 calories and 75+ grams of protein daily.   Exercise goals: As is.   Behavioral modification  strategies: increasing lean protein intake.  [name] has agreed to follow-up with our clinic in 4 weeks. She was informed of the importance of frequent follow-up visits to maximize her success with intensive lifestyle modifications for her multiple health conditions.   Objective:   Blood pressure 121/74, pulse 80, temperature 97.7 F (36.5 C), height '5\' 6"'$  (1.676 m), weight 176 lb (79.8 kg), last menstrual period 02/25/1995, SpO2 95 %. Body mass index is 28.41 kg/m.  General: Cooperative, alert, well developed, in no acute distress. HEENT: Conjunctivae and lids unremarkable. Cardiovascular: Regular rhythm.  Lungs: Normal work of breathing. Neurologic: No focal deficits.   Lab Results  Component Value Date   CREATININE 0.55 (L) 08/22/2020   BUN 11 08/22/2020   NA 142 08/22/2020   K 4.7 08/22/2020   CL 101 08/22/2020   CO2 24 08/22/2020   Lab Results  Component Value Date   ALT 20 01/09/2021   AST 19 01/09/2021   ALKPHOS 91 08/22/2020   BILITOT 0.4 08/22/2020   Lab Results  Component Value Date   HGBA1C 5.3 01/09/2021   HGBA1C 5.8 (H) 08/22/2020   HGBA1C 5.5 08/27/2015   Lab Results  Component Value Date   INSULIN 6.9 08/22/2020   Lab Results  Component Value Date   TSH 0.01 (A) 01/09/2021   Lab Results  Component Value Date   CHOL 149 01/09/2021   HDL 46 01/09/2021   LDLCALC 89 01/09/2021   TRIG 69 01/09/2021  CHOLHDL 3.9 02/11/2017   Lab Results  Component Value Date   VD25OH 39.9 01/09/2021   VD25OH 30.4 08/22/2020   VD25OH 28 (L) 02/11/2017   Lab Results  Component Value Date   WBC 6.5 08/22/2020   HGB 14.9 08/22/2020   HCT 45.4 08/22/2020   MCV 87 08/22/2020   PLT 222 02/11/2017   No results found for: "IRON", "TIBC", "FERRITIN"  Attestation Statements:   Reviewed by clinician on day of visit: allergies, medications, problem list, medical history, surgical history, family history, social history, and previous encounter notes.   I, Trixie Dredge, am acting as transcriptionist for Dennard Nip, MD.  I have reviewed the above documentation for accuracy and completeness, and I agree with the above. -  Dennard Nip, MD

## 2022-01-01 ENCOUNTER — Telehealth (INDEPENDENT_AMBULATORY_CARE_PROVIDER_SITE_OTHER): Payer: Self-pay | Admitting: Family Medicine

## 2022-01-01 NOTE — Telephone Encounter (Signed)
Patient MC is not working and would like to send a message to provider. She wanted to know what type of blood work needs to be done? If you would message her or call that would be great.

## 2022-01-02 ENCOUNTER — Ambulatory Visit (INDEPENDENT_AMBULATORY_CARE_PROVIDER_SITE_OTHER): Payer: Medicare HMO | Admitting: Sports Medicine

## 2022-01-02 ENCOUNTER — Encounter (INDEPENDENT_AMBULATORY_CARE_PROVIDER_SITE_OTHER): Payer: Self-pay

## 2022-01-02 DIAGNOSIS — M542 Cervicalgia: Secondary | ICD-10-CM | POA: Diagnosis not present

## 2022-01-02 DIAGNOSIS — M5030 Other cervical disc degeneration, unspecified cervical region: Secondary | ICD-10-CM | POA: Diagnosis not present

## 2022-01-02 MED ORDER — DOXEPIN HCL 50 MG PO CAPS: ORAL_CAPSULE | ORAL | 1 refills | Status: DC

## 2022-01-02 NOTE — Assessment & Plan Note (Signed)
Patient continues to do very well with improved exercise and weight loss Her neck and radicular symptoms are well controlled She does better with doxepin 50 twice daily and 100 at at bedtime We will continue that Continue tramadol 50 3 times daily with her doxepin  I think the ongoing water exercise program is the best way to preserve her neck motion We will recheck her in about 3 months

## 2022-01-02 NOTE — Telephone Encounter (Signed)
Left msg for pt advising that I do not see any labs ordered or to be done; may discuss at her next OV

## 2022-01-02 NOTE — Progress Notes (Signed)
Chief complaint chronic neck pain and radicular symptoms  On patient's last visit she was having more spasm in both trapezius muscles We increased her doxepin to 50 mg morning and afternoon and 100 mg at night This gave her significant improvement She also takes tramadol 3 times a day 50 mg She uses these in combination and has done quite well Where she used to get headaches 4 to 5 days a week she rarely has a headache now  She does notice increasing stiffness in the neck She is trying to improve her fitness and has lost about 15 pounds using mostly water-based exercises She has tried some walking but this tends to give her a lot of pain in both her knees hips and sometimes low back  Physical exam Pleasant older female in no acute distress BP 139/65   Ht '5\' 6"'$  (1.676 m)   Wt 176 lb (79.8 kg)   LMP 02/25/1995 (Approximate)   BMI 28.41 kg/m   She has tightness in palpation of both trapezius muscles but not as severe as on some past exams Neck range of motion is significantly limited in rotation to about 15 to 20 degrees to either side Neck lateral bend is also significantly limited  Flexion is full Extension is only moderately limited Neurologic testing C5-T1 reveals no weakness in the upper extremities

## 2022-01-09 ENCOUNTER — Ambulatory Visit: Payer: No Typology Code available for payment source | Admitting: Sports Medicine

## 2022-01-10 ENCOUNTER — Other Ambulatory Visit: Payer: Self-pay | Admitting: Sports Medicine

## 2022-01-13 ENCOUNTER — Encounter (INDEPENDENT_AMBULATORY_CARE_PROVIDER_SITE_OTHER): Payer: Self-pay

## 2022-01-13 ENCOUNTER — Ambulatory Visit (INDEPENDENT_AMBULATORY_CARE_PROVIDER_SITE_OTHER): Payer: Medicare HMO | Admitting: Family Medicine

## 2022-01-13 ENCOUNTER — Encounter (INDEPENDENT_AMBULATORY_CARE_PROVIDER_SITE_OTHER): Payer: Self-pay | Admitting: Family Medicine

## 2022-01-15 ENCOUNTER — Ambulatory Visit (INDEPENDENT_AMBULATORY_CARE_PROVIDER_SITE_OTHER): Payer: Medicare HMO | Admitting: Nurse Practitioner

## 2022-01-15 ENCOUNTER — Encounter: Payer: Self-pay | Admitting: Nurse Practitioner

## 2022-01-15 VITALS — BP 98/62 | HR 77

## 2022-01-15 DIAGNOSIS — L292 Pruritus vulvae: Secondary | ICD-10-CM | POA: Diagnosis not present

## 2022-01-15 DIAGNOSIS — R350 Frequency of micturition: Secondary | ICD-10-CM | POA: Diagnosis not present

## 2022-01-15 DIAGNOSIS — N952 Postmenopausal atrophic vaginitis: Secondary | ICD-10-CM

## 2022-01-15 LAB — WET PREP FOR TRICH, YEAST, CLUE

## 2022-01-15 MED ORDER — ESTRADIOL 0.1 MG/GM VA CREA: 1.0000 g | TOPICAL_CREAM | VAGINAL | 1 refills | Status: DC

## 2022-01-15 NOTE — Progress Notes (Signed)
   Acute Office Visit  Subjective:    Patient ID: [name], female    DOB: 05-23-1940, 81 y.o.   MRN: [id number]   HPI 81 y.o. presents today for vulvovaginal itching and irritation without discharge or odor x 2 months. Symptoms have not worsened or improved. Goes to pool daily for exercise. Changes out of her swim suit right away. Also complains of urinary frequency. This is not new. She urinates then feels like she has to go again a few minutes later. Denies urinary urgency or dysuria. Pelvic floor PT recommended earlier this year for pelvic floor dysfunction, urinary frequency, and pain. Did not proceed with PT. Not sexually active.    Review of Systems  Constitutional: Negative.   Genitourinary:  Positive for difficulty urinating, frequency and vaginal pain (Vulvovaginal irritation/itching). Negative for dysuria, flank pain, genital sores, hematuria, pelvic pain, urgency and vaginal discharge.       Objective:    Physical Exam Constitutional:      Appearance: Normal appearance.  Genitourinary:    General: Normal vulva.     Vagina: Normal.     Cervix: Normal.     Comments: Atrophic changes    BP 98/62   Pulse 77   LMP 02/25/1995 (Approximate)   SpO2 95%  Wt Readings from Last 3 Encounters:  01/02/22 176 lb (79.8 kg)  12/17/21 176 lb (79.8 kg)  10/31/21 175 lb (79.4 kg)        Patient informed chaperone available to be present for breast and/or pelvic exam. Patient has requested no chaperone to be present. Patient has been advised what will be completed during breast and pelvic exam.   Wet prep negative UA negative  Assessment & Plan:   Problem List Items Addressed This Visit       Genitourinary   Atrophic vaginitis - Primary   Relevant Medications   estradiol (ESTRACE VAGINAL) 0.1 MG/GM vaginal cream (Start on 01/16/2022)   Other Visit Diagnoses     Vulvovaginal itching       Relevant Orders   WET PREP FOR [place], YEAST, CLUE (Completed)   Urinary  frequency       Relevant Orders   Urinalysis,Complete w/RFL Culture (Completed)      Plan: Negative wet prep. Symptoms likely related to atrophic vaginitis that is also exacerbated by being in chlorine water daily. Management options of OTC coconut oil or other moisturizer before and after swimming or vaginal estrogen. She would like to try both. Educated on vaginal estrogen use and risk of small amount of systemic absorption. Recommend pelvic floor PT and she will consider.      Tamela Gammon DNP, 12:07 PM 01/15/2022

## 2022-01-17 LAB — URINE CULTURE
MICRO NUMBER:: 14224481
SPECIMEN QUALITY:: ADEQUATE

## 2022-01-17 LAB — URINALYSIS, COMPLETE W/RFL CULTURE
Bilirubin Urine: NEGATIVE
Glucose, UA: NEGATIVE
Hgb urine dipstick: NEGATIVE
Hyaline Cast: NONE SEEN /LPF
Ketones, ur: NEGATIVE
Nitrites, Initial: NEGATIVE
Protein, ur: NEGATIVE
RBC / HPF: NONE SEEN /HPF (ref 0–2)
Specific Gravity, Urine: 1.015 (ref 1.001–1.035)
pH: 6.5 (ref 5.0–8.0)

## 2022-01-17 LAB — CULTURE INDICATED

## 2022-01-20 LAB — BASIC METABOLIC PANEL
BUN: 17 (ref 4–21)
CO2: 23 — AB (ref 13–22)
Chloride: 106 (ref 99–108)
Creatinine: 0.7 (ref 0.5–1.1)
Glucose: 105
Potassium: 4.6 mEq/L (ref 3.5–5.1)
Sodium: 140 (ref 137–147)

## 2022-01-20 LAB — LIPID PANEL
Cholesterol: 179 (ref 0–200)
HDL: 52 (ref 35–70)
LDL Cholesterol: 111
LDl/HDL Ratio: 3.4
Triglycerides: 78 (ref 40–160)

## 2022-01-20 LAB — TSH: TSH: 0.01 — AB (ref 0.41–5.90)

## 2022-01-20 LAB — HEPATIC FUNCTION PANEL
ALT: 22 U/L (ref 7–35)
AST: 18 (ref 13–35)
Alkaline Phosphatase: 67 (ref 25–125)
Bilirubin, Total: 0.7

## 2022-01-20 LAB — COMPREHENSIVE METABOLIC PANEL
Albumin: 4 (ref 3.5–5.0)
Calcium: 9.5 (ref 8.7–10.7)
eGFR: 80.3

## 2022-01-20 LAB — VITAMIN D 25 HYDROXY (VIT D DEFICIENCY, FRACTURES): Vit D, 25-Hydroxy: 33.9

## 2022-01-20 LAB — HEMOGLOBIN A1C: Hemoglobin A1C: 5.4

## 2022-01-28 ENCOUNTER — Telehealth: Payer: Self-pay | Admitting: Neurology

## 2022-01-28 NOTE — Telephone Encounter (Signed)
Pt would like a call from the nurse to discuss switching from one DME carrier to another. Can call pt call after 3 pm.

## 2022-01-28 NOTE — Telephone Encounter (Signed)
Called pt. She has decided to forgo [facility] as DME and has switched over to [facility]. She spoke with them today and scheduled set up date for new CPAP for 02/20/22.  She already has f/u scheduled for 05/05/22 with Dr. Brett Fairy.  Confirmed we can still view data on resmed airview for current machine.

## 2022-02-03 ENCOUNTER — Encounter (INDEPENDENT_AMBULATORY_CARE_PROVIDER_SITE_OTHER): Payer: Self-pay | Admitting: Family Medicine

## 2022-02-03 ENCOUNTER — Ambulatory Visit (INDEPENDENT_AMBULATORY_CARE_PROVIDER_SITE_OTHER): Payer: Medicare HMO | Admitting: Family Medicine

## 2022-02-03 VITALS — BP 109/63 | HR 83 | Temp 98.1°F | Ht 66.0 in | Wt 175.0 lb

## 2022-02-03 DIAGNOSIS — Z6828 Body mass index (BMI) 28.0-28.9, adult: Secondary | ICD-10-CM | POA: Diagnosis not present

## 2022-02-03 DIAGNOSIS — R600 Localized edema: Secondary | ICD-10-CM

## 2022-02-03 DIAGNOSIS — E669 Obesity, unspecified: Secondary | ICD-10-CM

## 2022-02-04 NOTE — Progress Notes (Unsigned)
Cardiology Office Note:    Date:  02/05/2022   ID:  [name], DOB [birth year], MRN [id number]  PCP:  Reynold Bowen, [facility] Cardiologist:  Evalina Field, MD     Referring MD: Reynold Bowen, MD   Chief Complaint:  Leg Swelling     History of Present Illness:   [name] is a 81 y.o. female with history of OSA and fibromyalgia. She  was seen in 2015 and decided to not undergo a stress test as she was evaluated by her psychiatrist and medications were changed. She saw Dr. Audie Box 06/2019 with chest pain, strong family history of CAD -calcium score 25.7, Esophageal air fluid suggests dysmotility or GERD.  Patient added onto my schedule for LE edema referred from weight loss center. She went to [place] in Oct and has had edema since. She is drinking a lot of water and was put on a diuretic and has noticed no difference. some pain but some redness.  Does water aerobics twice a day and swims 5 days a week.  Has occasional sharp shooting chest pains, no SOB or other complaints. Has lost 15 lbs at weight loss center.      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   Cholecalciferol 125 MCG (5000 UT) capsule Take by mouth.   doxepin (SINEQUAN) 100 MG capsule Take 1 capsule (100 mg total) by mouth at bedtime.   doxepin (SINEQUAN) 50 MG capsule Take 1 capsule (50 mg) twice daily. Take 2 (100 mg) capsules at night.   Eszopiclone 3 MG TABS Take 3 mg by mouth at bedtime. Take immediately before bedtime   hydrochlorothiazide (MICROZIDE) 12.5 MG capsule Take 12.5 mg by mouth daily.   levothyroxine (SYNTHROID, LEVOTHROID) 200 MCG tablet Take 200 mcg by mouth daily before breakfast.   LORazepam (ATIVAN) 1 MG tablet Take 1 tablet (1 mg total) by mouth 2 (two) times daily.   melatonin 3 MG TABS tablet Take 20 mg by mouth.   methocarbamol (ROBAXIN) 500 MG tablet TAKE 1 TABLET BY MOUTH 4 TIMES DAILY.   montelukast (SINGULAIR) 10 MG tablet TAKE 1 TABLET BY MOUTH EVERY DAY   polyethylene glycol (MIRALAX / GLYCOLAX) packet Take 17 g by mouth daily.   traMADol (ULTRAM) 50 MG tablet Take 1 tablet (50 mg total) by mouth in the  morning, at noon, and at bedtime.   UBRELVY 50 MG TABS Take by mouth as needed.   Vitamin D, Ergocalciferol, (DRISDOL) 1.25 MG (50000 UNIT) CAPS capsule Take 1 capsule (50,000 Units total) by mouth every 7 (seven) days.    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   Vaping Use   Vaping Use: Never used  Substance Use Topics   Alcohol use: Yes    Alcohol/week: 0.0 standard drinks of alcohol    Comment: 1 glass - occas of vodka   Drug use: No    Family Hx: The patient's family history includes Aneurysm in her father; Arrhythmia in her brother; Breast cancer in her  maternal aunt, maternal aunt, maternal aunt, maternal aunt, and mother; Cancer in her mother; Heart attack in her paternal grandfather and paternal grandmother; Heart disease in an other family member; High blood pressure in her brother; Hypertension in her mother; Kidney cancer in her brother; Melanoma in her brother; Migraines in her father; Thyroid disease in her mother.  ROS     Physical Exam:    VS:  BP 102/65   Pulse 76   Ht '5\' 6"'$  (1.676 m)   Wt 180 lb 6.4 oz (81.8 kg)   LMP 02/25/1995 (Approximate)   SpO2 98%   BMI 29.12 kg/m     Wt Readings from Last 3 Encounters:  02/05/22 180 lb 6.4 oz (81.8 kg)  02/03/22 175 lb (79.4 kg)  01/02/22 176 lb (79.8 kg)    Physical Exam  GEN: Well nourished, well developed, in no acute distress  Neck: no JVD, carotid bruits, or masses Cardiac:RRR; no murmurs, rubs, or gallops  Respiratory:  clear to auscultation bilaterally, normal work of breathing GI: soft, nontender, nondistended, + BS Ext: bilateral LE edema R>L with some tenderness and erythema on the right otherwise without cyanosis, clubbing, Good distal pulses bilaterally Neuro:  Alert and Oriented x 3, Strength and sensation are intact Psych: euthymic mood, full affect        EKGs/Labs/Other Test Reviewed:    EKG:  EKG is   ordered today.  The ekg ordered today demonstrates NSR with PVC otherwise normal  Recent Labs: No results found for requested labs within last 365 days.   Recent Lipid Panel No results for input(s): "CHOL", "TRIG", "HDL", "VLDL", "LDLCALC", "LDLDIRECT" in the last 8760 hours.   Prior CV Studies:     INDINGS: CORONARY CALCIUM SCORES:   Left Main: 0   LAD: 15.5   LCx: 0   RCA: 10.2   Total Agatston Score: 25.7   MESA database percentile: 28   AORTA MEASUREMENTS:   Ascending Aorta: 34 mm   Descending Aorta: 25 mm   OTHER FINDINGS:   Cardiovascular: Tortuous thoracic aorta. Mild cardiomegaly, without pericardial effusion. Aortic  valve calcifications are nonspecific in this age group.   Mediastinum/Nodes: No imaged thoracic adenopathy. Subtle fluid level in the esophagus on 05/03.   Lungs/Pleura: No pleural fluid.  Bibasilar scarring.   A 3 mm lingular nodule on 18/9 is present on the prior exam and can be considered benign.   Upper Abdomen: Hepatic cysts including at up to 3.9 cm within segment 2. Normal imaged portions of the spleen,  stomach.   Musculoskeletal: Remote right rib fractures.   IMPRESSION: 1. Total Agatston score of 25.7, corresponding to 28th percentile for age, sex, and race based cohort. 2. Esophageal air fluid level suggests dysmotility or gastroesophageal reflux. 3. Aortic Atherosclerosis (ICD10-I70.0).      Risk Assessment/Calculations/Metrics:              ASSESSMENT & PLAN:   No problem-specific Assessment & Plan notes found for this encounter.   LE edema ever since a flight to [place] in Oct. No improvement with HCTZ. No extra salt in diet, no dyspnea. Will check dopplers to rule out DVT, echo, compression stockings. Early f/u after testing  History of chest pain with Coronary calcium score 25-evidence of GERD  OSA on CPAP            Dispo:  No follow-ups on file.   Medication Adjustments/Labs and Tests Ordered: Current medicines are reviewed at length with the patient today.  Concerns regarding medicines are outlined above.  Tests Ordered: Orders Placed This Encounter  Procedures   EKG 12-Lead   ECHOCARDIOGRAM COMPLETE   VAS [place] LOWER EXTREMITY VENOUS (DVT)   Medication Changes: No orders of the defined types were placed in this encounter.  Signed, Ermalinda Barrios, PA-C  02/05/2022 8:56 AM    [facility] [address] [place], [place],   [address] Phone: [phone]; Fax: [phone]

## 2022-02-05 ENCOUNTER — Ambulatory Visit: Payer: Medicare HMO | Attending: Physician Assistant | Admitting: Physician Assistant

## 2022-02-05 ENCOUNTER — Encounter: Payer: Self-pay | Admitting: Physician Assistant

## 2022-02-05 VITALS — BP 102/65 | HR 76 | Ht 66.0 in | Wt 180.4 lb

## 2022-02-05 DIAGNOSIS — Z87898 Personal history of other specified conditions: Secondary | ICD-10-CM

## 2022-02-05 DIAGNOSIS — G4733 Obstructive sleep apnea (adult) (pediatric): Secondary | ICD-10-CM

## 2022-02-05 DIAGNOSIS — R600 Localized edema: Secondary | ICD-10-CM

## 2022-02-05 NOTE — Patient Instructions (Signed)
Medication Instructions:  Your physician recommends that you continue on your current medications as directed. Please refer to the Current Medication list given to you today.  *If you need a refill on your cardiac medications before your next appointment, please call your pharmacy*   Lab Work: None ordered   If you have labs (blood work) drawn today and your tests are completely normal, you will receive your results only by: [place] (if you have MyChart) OR A paper copy in the mail If you have any lab test that is abnormal or we need to change your treatment, we will call you to review the results.   Testing/Procedures: Your physician has requested that you have an echocardiogram. Echocardiography is a painless test that uses sound waves to create images of your heart. It provides your doctor with information about the size and shape of your heart and how well your heart's chambers and valves are working. This procedure takes approximately one hour. There are no restrictions for this procedure. Please do NOT wear cologne, perfume, aftershave, or lotions (deodorant is allowed). Please arrive 15 minutes prior to your appointment time.  Your physician has requested that you have a lower extremity arterial exercise duplex. During this test, exercise and ultrasound are used to evaluate arterial blood flow in the legs. Allow one hour for this exam. There are no restrictions or special instructions.    Follow-Up: Follow up with Ermalinda Barrios PA-C after your testing is complete   Other Instructions   Important Information About Sugar

## 2022-02-06 ENCOUNTER — Ambulatory Visit (HOSPITAL_COMMUNITY)
Admission: RE | Admit: 2022-02-06 | Discharge: 2022-02-06 | Disposition: A | Discharge status: Home / Self Care | Payer: Medicare HMO | Source: Ambulatory Visit | Attending: Cardiovascular Disease | Admitting: Cardiovascular Disease

## 2022-02-06 DIAGNOSIS — R600 Localized edema: Secondary | ICD-10-CM | POA: Diagnosis present

## 2022-02-12 NOTE — Progress Notes (Signed)
Chief Complaint:   OBESITY [name] is here to discuss her progress with her obesity treatment plan along with follow-up of her obesity related diagnoses. [name] is on keeping a food journal and adhering to recommended goals of 1000-1200 calories and 75+ grams of protein daily and states she is following her eating plan approximately 70% of the time. [name] states she is doing water exercise for 60 minutes 5-6 times per week.  Today's visit was #: 25 Starting weight: 188 lbs Starting date: 08/22/2020 Today's weight: 175 lbs Today's date: 02/03/2022 Total lbs lost to date: 13 Total lbs lost since last in-office visit: 1  Interim History: [name] is working on her diet and weight loss, and she is exercising regularly. She will be traveling over the holidays and is working on decreasing simple carbohydrates.   Subjective:   1. Bilateral lower extremity edema Jayleana notes increase in edema over the last 2 months. No improvement on hydrochlorothiazide. She denies shortness of breath with exertion or orthopnea. She had an echocardiogram done in 2015, and it showed grade I diastolic dysfunction.    Assessment/Plan:   1. Bilateral lower extremity edema [name] was referred to Cardiology for an echocardiogram.   - Ambulatory referral to Cardiology  2. Obesity, Current BMI 28.2 [name] is currently in the action stage of change. As such, her goal is to continue with weight loss efforts. She has agreed to keeping a food journal and adhering to recommended goals of 1000-1200 calories and 75+ grams of protein daily.   Exercise goals: As is.   Behavioral modification strategies: increasing lean protein intake, travel eating strategies, and holiday eating strategies .  [name] has agreed to follow-up with our clinic in 4 to 5 weeks. She was informed of the importance of frequent follow-up visits to maximize her success with intensive lifestyle modifications for her multiple health conditions.   Objective:    Blood pressure 109/63, pulse 83, temperature 98.1 F (36.7 C), height '5\' 6"'$  (1.676 m), weight 175 lb (79.4 kg), last menstrual period 02/25/1995, SpO2 94 %. Body mass index is 28.25 kg/m.  General: Cooperative, alert, well developed, in no acute distress. HEENT: Conjunctivae and lids unremarkable. Cardiovascular: Regular rhythm.  Lungs: Normal work of breathing. Neurologic: No focal deficits.   Lab Results  Component Value Date   CREATININE 0.55 (L) 08/22/2020   BUN 11 08/22/2020   NA 142 08/22/2020   K 4.7 08/22/2020   CL 101 08/22/2020   CO2 24 08/22/2020   Lab Results  Component Value Date   ALT 20 01/09/2021   AST 19 01/09/2021   ALKPHOS 91 08/22/2020   BILITOT 0.4 08/22/2020   Lab Results  Component Value Date   HGBA1C 5.3 01/09/2021   HGBA1C 5.8 (H) 08/22/2020   HGBA1C 5.5 08/27/2015   Lab Results  Component Value Date   INSULIN 6.9 08/22/2020   Lab Results  Component Value Date   TSH 0.01 (A) 01/09/2021   Lab Results  Component Value Date   CHOL 149 01/09/2021   HDL 46 01/09/2021   LDLCALC 89 01/09/2021   TRIG 69 01/09/2021   CHOLHDL 3.9 02/11/2017   Lab Results  Component Value Date   VD25OH 39.9 01/09/2021   VD25OH 30.4 08/22/2020   VD25OH 28 (L) 02/11/2017   Lab Results  Component Value Date   WBC 6.5 08/22/2020   HGB 14.9 08/22/2020   HCT 45.4 08/22/2020   MCV 87 08/22/2020   PLT 222 02/11/2017   No  results found for: "IRON", "TIBC", "FERRITIN"  Attestation Statements:   Reviewed by clinician on day of visit: allergies, medications, problem list, medical history, surgical history, family history, social history, and previous encounter notes.  Time spent on visit including pre-visit chart review and post-visit care and charting was 30 minutes.   I, Trixie Dredge, am acting as transcriptionist for Dennard Nip, MD.  I have reviewed the above documentation for accuracy and completeness, and I agree with the above. -  Dennard Nip,  MD

## 2022-03-07 ENCOUNTER — Ambulatory Visit (HOSPITAL_COMMUNITY): Payer: Medicare HMO | Attending: Physician Assistant

## 2022-03-07 DIAGNOSIS — R600 Localized edema: Secondary | ICD-10-CM | POA: Diagnosis present

## 2022-03-08 LAB — ECHOCARDIOGRAM COMPLETE
Area-P 1/2: 2.63 cm2
P 1/2 time: 557 msec
S' Lateral: 2.4 cm

## 2022-03-13 ENCOUNTER — Encounter (INDEPENDENT_AMBULATORY_CARE_PROVIDER_SITE_OTHER): Payer: Self-pay | Admitting: Family Medicine

## 2022-03-13 ENCOUNTER — Ambulatory Visit (INDEPENDENT_AMBULATORY_CARE_PROVIDER_SITE_OTHER): Payer: Medicare HMO | Admitting: Family Medicine

## 2022-03-13 VITALS — BP 123/75 | HR 80 | Temp 97.5°F | Ht 66.0 in

## 2022-03-13 DIAGNOSIS — E669 Obesity, unspecified: Secondary | ICD-10-CM

## 2022-03-13 DIAGNOSIS — E559 Vitamin D deficiency, unspecified: Secondary | ICD-10-CM

## 2022-03-13 DIAGNOSIS — Z6828 Body mass index (BMI) 28.0-28.9, adult: Secondary | ICD-10-CM

## 2022-03-13 DIAGNOSIS — Z6829 Body mass index (BMI) 29.0-29.9, adult: Secondary | ICD-10-CM | POA: Diagnosis not present

## 2022-03-13 MED ORDER — VITAMIN D (ERGOCALCIFEROL) 1.25 MG (50000 UNIT) PO CAPS: 50000.0000 [IU] | ORAL_CAPSULE | ORAL | 0 refills | Status: DC

## 2022-03-18 ENCOUNTER — Encounter: Payer: Self-pay | Admitting: Neurology

## 2022-03-18 MED ORDER — TRAMADOL HCL 50 MG PO TABS: 50.0000 mg | ORAL_TABLET | Freq: Three times a day (TID) | ORAL | 4 refills | Status: DC

## 2022-03-18 NOTE — Progress Notes (Unsigned)
     Chief Complaint:   OBESITY Aiyana is here to discuss her progress with her obesity treatment plan along with follow-up of her obesity related diagnoses. Lakeesha is on {MWMwtlossportion/plan2:23431} and states she is following her eating plan approximately ***% of the time. Rondia states she is *** *** minutes *** times per week.  Today's visit was #: *** Starting weight: *** Starting date: *** Today's weight: *** Today's date: 03/13/2022 Total lbs lost to date: *** Total lbs lost since last in-office visit: ***  Interim History: ***  Subjective:   1. Vitamin D deficiency ***  Assessment/Plan:   1. Vitamin D deficiency *** - Vitamin D, Ergocalciferol, (DRISDOL) 1.25 MG (50000 UNIT) CAPS capsule; Take 1 capsule (50,000 Units total) by mouth every 7 (seven) days.  Dispense: 13 capsule; Refill: 0  2. BMI 28.0-28.9,adult  3. Obesity, with starting BMI 30.34 Crystall is currently in the action stage of change. As such, her goal is to continue with weight loss efforts. She has agreed to change to the Category 2 Plan.   Exercise goals: As is.   Behavioral modification strategies: increasing lean protein intake.  Canna has agreed to follow-up with our clinic in 4 weeks. She was informed of the importance of frequent follow-up visits to maximize her success with intensive lifestyle modifications for her multiple health conditions.   Objective:   Blood pressure 123/75, pulse 80, temperature (!) 97.5 F (36.4 C), height '5\' 6"'$  (1.676 m), last menstrual period 02/25/1995, SpO2 95 %. Body mass index is 29.12 kg/m.  General: Cooperative, alert, well developed, in no acute distress. HEENT: Conjunctivae and lids unremarkable. Cardiovascular: Regular rhythm.  Lungs: Normal work of breathing. Neurologic: No focal deficits.   Lab Results  Component Value Date   CREATININE 0.55 (L) 08/22/2020   BUN 11 08/22/2020   NA 142 08/22/2020   K 4.7 08/22/2020   CL 101 08/22/2020   CO2 24  08/22/2020   Lab Results  Component Value Date   ALT 20 01/09/2021   AST 19 01/09/2021   ALKPHOS 91 08/22/2020   BILITOT 0.4 08/22/2020   Lab Results  Component Value Date   HGBA1C 5.3 01/09/2021   HGBA1C 5.8 (H) 08/22/2020   HGBA1C 5.5 08/27/2015   Lab Results  Component Value Date   INSULIN 6.9 08/22/2020   Lab Results  Component Value Date   TSH 0.01 (A) 01/09/2021   Lab Results  Component Value Date   CHOL 149 01/09/2021   HDL 46 01/09/2021   LDLCALC 89 01/09/2021   TRIG 69 01/09/2021   CHOLHDL 3.9 02/11/2017   Lab Results  Component Value Date   VD25OH 39.9 01/09/2021   VD25OH 30.4 08/22/2020   VD25OH 28 (L) 02/11/2017   Lab Results  Component Value Date   WBC 6.5 08/22/2020   HGB 14.9 08/22/2020   HCT 45.4 08/22/2020   MCV 87 08/22/2020   PLT 222 02/11/2017   No results found for: "IRON", "TIBC", "FERRITIN"  Attestation Statements:   Reviewed by clinician on day of visit: allergies, medications, problem list, medical history, surgical history, family history, social history, and previous encounter notes.  Time spent on visit including pre-visit chart review and post-visit care and charting was 30 minutes.   I, Trixie Dredge, am acting as transcriptionist for Dennard Nip, MD.  I have reviewed the above documentation for accuracy and completeness, and I agree with the above. -  ***

## 2022-04-03 ENCOUNTER — Ambulatory Visit (INDEPENDENT_AMBULATORY_CARE_PROVIDER_SITE_OTHER): Payer: Medicare HMO | Admitting: Sports Medicine

## 2022-04-03 VITALS — BP 114/62 | Ht 66.0 in | Wt 180.0 lb

## 2022-04-03 DIAGNOSIS — M5030 Other cervical disc degeneration, unspecified cervical region: Secondary | ICD-10-CM

## 2022-04-03 NOTE — Assessment & Plan Note (Signed)
This is really stable at this point and controlled with Doxepin 50 bid and 100 hs and tramadol 50 tid  I would leave this as her long term maintenance as she has dramatically reduced head aches and been able to get back into exercise  Reck in 3 months

## 2022-04-03 NOTE — Progress Notes (Signed)
PCP: Reynold Bowen, MD  Subjective:   HPI:  Chronic Neck Pain Patient is a 82 y.o. female here for 66-monthfollow-up for chronic neck pain and radicular symptoms. She has continued to take doxepin 50 mg in the morning and afternoon followed by 100 mg at bedtime along with tramadol 50 mg 3 times daily. Has had 3 migraines since last visit and started taking Ubrogepant for abortive therapy which works.  She is still doing water aerobics. She has had more neck pain since last visit, difficulty turning her head to the L while driving.   Low back/Sacral pain Pain at night when laying down, has to lie flat d/t CPAP machines. Propping legs up with pillows seems to help. Pain not worse with any movement.   Knee pain Pain upon standing but pain resolves quickly with movement. TKR bilaterally 12 years ago.   BP 114/62   Ht '5\' 6"'$  (1.676 m)   Wt 180 lb (81.6 kg)   LMP 02/25/1995 (Approximate)   BMI 29.05 kg/m       Objective:  Physical Exam: Older F in NAD  Neck - Inspection: no gross deformity or asymmetry, swelling or ecchymosis - Reduced ROM of the cervical spine with neck extension, rotation, flexion   Knee: - Inspection: no gross deformity. No swelling/effusion, erythema or bruising. - Palpation: no TTP - Good ROM with flexion and extension bilaterally - Neuro/vasc: NV intact - Special Tests: - LIGAMENTS: negative anterior drawer  Lumbar spine: - Palpation: Mild TTP over top of sacrum/lumbar musculature, no TTP of spinous process of lumbar spine - No pain with flexion, extension, or rotation, mild pain with side bending bilaterally     Assessment & Plan:   Chronic neck pain -Continue current regimen of doxepin 50 mg in the morning and afternoon and 100 mg at bedtime and tramadol 50 mg 3 times daily. 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  Could be related to fluid buildup, if pain persists can ice knees.  Low back pain Worse with lying flat, likely related to stretching of lumbar spinal nerves.  Continue to prop knees up with pillows at night.  SPrecious Gilding DO Family medicine resident, PGY-2  I observed and examined the patient with the resident and agree with assessment and plan.  Note reviewed and modified by me. KIla Mcgill MD

## 2022-04-08 NOTE — Progress Notes (Signed)
Cardiology Office Note:    Date:  04/15/2022   ID:  RAKYAH Green, DOB 1940-07-22, MRN [id number]  PCP:  [name], [place] [facility] Cardiologist:  Evalina Field, MD     Referring MD: Brenda Bowen, MD   Chief Complaint:  Follow-up     History of Present Illness:   [name] is a 82 y.o. female with history of OSA and fibromyalgia. She  was seen in 2015 and decided to not undergo a stress test as she was evaluated by her psychiatrist and medications were changed. She saw Dr. Audie Box 06/2019 with chest pain, strong family history of CAD -calcium score 25.7, Esophageal air fluid suggests dysmotility or GERD.  I saw the patient 01/2022 with LE edema since going to [place] in Oct. No improvement with diuretic.   Echo showed normal LVEF and diastolic function. No change from prior echo. LE venous dopplers negative for DVT.  Patient comes in for regular check up. Feels nauseated and shooting pain in stomach area under left breast. Had cottage cheese, blueberries and coffee. Had dysmotility or GERD on Cor calcium score. Swelling has gone down with compression hose.       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   azelastine (ASTELIN) 0.1 % nasal spray Place into the nose.   doxepin (SINEQUAN) 100 MG capsule Take 1 capsule (100 mg total) by mouth at bedtime.   doxepin (SINEQUAN) 50 MG capsule Take 1 capsule (50 mg) twice daily. Take 2 (100 mg) capsules at night.   Eszopiclone 3 MG TABS Take 3 mg by mouth at bedtime. Take immediately before bedtime   hydrochlorothiazide (MICROZIDE) 12.5 MG capsule Take 12.5 mg by mouth daily.   levothyroxine (SYNTHROID, LEVOTHROID) 200 MCG tablet Take 200 mcg by mouth daily before breakfast.   LORazepam (ATIVAN) 1 MG tablet Take 1 tablet (1 mg total) by mouth 2 (two) times daily.   lubiprostone (AMITIZA) 24 MCG capsule Take 24 mcg by mouth 2 (two) times daily with a meal.   melatonin 3 MG TABS tablet Take 20 mg by mouth.   montelukast (SINGULAIR) 10 MG tablet TAKE 1 TABLET BY MOUTH EVERY DAY   polyethylene glycol (MIRALAX / GLYCOLAX) packet Take 17 g by mouth daily.   traMADol (ULTRAM) 50 MG tablet Take 1 tablet (50 mg total) by  mouth in the morning, at noon, and at bedtime.   UBRELVY 50 MG TABS Take by mouth as needed.   Vitamin D, Ergocalciferol, (DRISDOL) 1.25 MG (50000 UNIT) CAPS capsule Take 1 capsule (50,000 Units total) by mouth every 7 (seven) days.    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   Vaping Use   Vaping Use: Never used  Substance Use Topics   Alcohol use: Yes    Alcohol/week: 0.0 standard drinks of alcohol    Comment: 1 glass - occas of vodka   Drug use: No    Family Hx: The patient's family history includes  Aneurysm in her father; Arrhythmia in her brother; Breast cancer in her maternal aunt, maternal aunt, maternal aunt, maternal aunt, and mother; Cancer in her mother; Heart attack in her paternal grandfather and paternal grandmother; Heart disease in an other family member; High blood pressure in her brother; Hypertension in her mother; Kidney cancer in her brother; Melanoma in her brother; Migraines in her father; Thyroid disease in her mother.  ROS     Physical Exam:    VS:  BP 118/66   Pulse 74   Ht 5' 6"$  (1.676 m)   Wt 182 lb 12.8 oz (82.9 kg)   LMP 02/25/1995 (Approximate)   SpO2 95%   BMI 29.50 kg/m     Wt Readings from Last 3 Encounters:  04/15/22 182 lb 12.8 oz (82.9 kg)  04/10/22 174 lb (78.9 kg)  04/03/22 180 lb (81.6 kg)    Physical Exam  GEN: Well nourished, well developed, in no acute distress  Neck: no JVD, carotid bruits, or masses Cardiac:RRR; no murmurs, rubs, or gallops  Respiratory:  clear to auscultation bilaterally, normal work of breathing GI: soft, nontender, nondistended, + BS Ext: without cyanosis, clubbing, or edema, Good distal pulses bilaterally Neuro:  Alert and Oriented x 3,  Psych: euthymic mood, full affect        EKGs/Labs/Other Test Reviewed:    EKG:  EKG is  not ordered today.    Recent Labs: No results found for requested labs within last 365 days.   Recent Lipid Panel No results for input(s): "CHOL", "TRIG", "HDL", "VLDL", "LDLCALC", "LDLDIRECT" in the last 8760 hours.   Prior CV Studies:   Echo 03/07/22 MPRESSIONS     1. Left ventricular ejection fraction, by estimation, is 55 to 60%. The  left ventricle has normal function. The left ventricle has no regional  wall motion abnormalities. There is mild left ventricular hypertrophy.  Left ventricular diastolic parameters  were normal.   2. Right ventricular systolic function is normal. The right ventricular  size is normal.   3. The mitral valve is normal in structure. Trivial  mitral valve  regurgitation. No evidence of mitral stenosis.   4. The aortic valve is tricuspid. There is mild calcification of the  aortic valve. Aortic valve regurgitation is trivial. No aortic stenosis is  present.   5. The inferior vena cava is normal in size with greater than 50%  respiratory variability, suggesting right atrial pressure of 3 mmHg.   Comparison(s): No significant change from prior study.   Conclusion(s)/Recommendation(s): Otherwise normal echocardiogram, with  minor abnormalities described in the report.  LE dopplers 01/2022 BILATERAL:  - No evidence of deep vein thrombosis seen in the lower extremities,  bilaterally.  -  RIGHT:  - No cystic structure found in the popliteal fossa.    LEFT:  - No cystic structure found in the popliteal fossa.     *See table(s) above for measurements and observations.    Risk Assessment/Calculations/Metrics:              ASSESSMENT & PLAN:   No problem-specific Assessment & Plan notes found for this encounter.   LE edema ever since a flight to [place] in Oct. No improvement with HCTZ. No extra salt in diet, no dyspnea. Venous dopplers negative for DVT, echo unchanged with normal LVEF, compression stockings.Compression hose have helped. No changes.   History of chest pain with Coronary calcium score 25-evidence of GERD   OSA on CPAP  Abdominal pain and nausea with possible dysmotility or GERD on coronary calcium score 2021. She has GI who she'll called and schedule appt with.            Dispo:  No follow-ups on file.   Medication Adjustments/Labs and Tests Ordered: Current medicines are reviewed at length with the patient today.  Concerns regarding medicines are outlined above.  Tests Ordered: No orders of the defined types were placed in this encounter.  Medication Changes: No orders of the defined types were placed in this encounter.  Sumner Boast, PA-C  04/15/2022 10:41 AM    [facility] [address], [place], [place]  [address] Phone: [phone]; Fax: [phone]

## 2022-04-10 ENCOUNTER — Telehealth (INDEPENDENT_AMBULATORY_CARE_PROVIDER_SITE_OTHER): Payer: Self-pay | Admitting: Family Medicine

## 2022-04-10 ENCOUNTER — Encounter (INDEPENDENT_AMBULATORY_CARE_PROVIDER_SITE_OTHER): Payer: Self-pay | Admitting: Family Medicine

## 2022-04-10 ENCOUNTER — Ambulatory Visit (INDEPENDENT_AMBULATORY_CARE_PROVIDER_SITE_OTHER): Payer: Medicare HMO | Admitting: Family Medicine

## 2022-04-10 VITALS — BP 127/76 | HR 81 | Temp 98.0°F | Ht 66.0 in | Wt 174.0 lb

## 2022-04-10 DIAGNOSIS — Z6828 Body mass index (BMI) 28.0-28.9, adult: Secondary | ICD-10-CM

## 2022-04-10 DIAGNOSIS — E559 Vitamin D deficiency, unspecified: Secondary | ICD-10-CM | POA: Diagnosis not present

## 2022-04-10 DIAGNOSIS — E669 Obesity, unspecified: Secondary | ICD-10-CM

## 2022-04-10 MED ORDER — VITAMIN D (ERGOCALCIFEROL) 1.25 MG (50000 UNIT) PO CAPS: 50000.0000 [IU] | ORAL_CAPSULE | ORAL | 0 refills | Status: DC

## 2022-04-10 NOTE — Telephone Encounter (Signed)
Pt called stating that her insurance will only approveda 90 day supply of the vitamin D. Pt would like Dr. Jenetta Downer to change Vitamin D RX to a 90 Day supply.

## 2022-04-15 ENCOUNTER — Ambulatory Visit: Payer: Medicare HMO | Attending: Physician Assistant | Admitting: Physician Assistant

## 2022-04-15 ENCOUNTER — Encounter: Payer: Self-pay | Admitting: Physician Assistant

## 2022-04-15 VITALS — BP 118/66 | HR 74 | Ht 66.0 in | Wt 182.8 lb

## 2022-04-15 DIAGNOSIS — G4733 Obstructive sleep apnea (adult) (pediatric): Secondary | ICD-10-CM | POA: Diagnosis not present

## 2022-04-15 DIAGNOSIS — R1012 Left upper quadrant pain: Secondary | ICD-10-CM

## 2022-04-15 DIAGNOSIS — Z87898 Personal history of other specified conditions: Secondary | ICD-10-CM

## 2022-04-15 DIAGNOSIS — R600 Localized edema: Secondary | ICD-10-CM

## 2022-04-15 NOTE — Patient Instructions (Signed)
Medication Instructions:  Your physician recommends that you continue on your current medications as directed. Please refer to the Current Medication list given to you today.  *If you need a refill on your cardiac medications before your next appointment, please call your pharmacy*   Lab Work: None ordered   If you have labs (blood work) drawn today and your tests are completely normal, you will receive your results only by: [place] (if you have MyChart) OR A paper copy in the mail If you have any lab test that is abnormal or we need to change your treatment, we will call you to review the results.   Testing/Procedures: None ordered    Follow-Up: At [facility], you and your health needs are our priority.  As part of our continuing mission to provide you with exceptional heart care, we have created designated Provider Care Teams.  These Care Teams include your primary Cardiologist (physician) and Advanced Practice Providers (APPs -  Physician Assistants and Nurse Practitioners) who all work together to provide you with the care you need, when you need it.  We recommend signing up for the patient portal called "MyChart".  Sign up information is provided on this After Visit Summary.  MyChart is used to connect with patients for Virtual Visits (Telemedicine).  Patients are able to view lab/test results, encounter notes, upcoming appointments, etc.  Non-urgent messages can be sent to your provider as well.   To learn more about what you can do with MyChart, go to [email or web address].    Your next appointment:   12 month(s)  Provider:   Evalina Field, MD     Other Instructions

## 2022-04-28 NOTE — Progress Notes (Signed)
Chief Complaint:   OBESITY [name] is here to discuss her progress with her obesity treatment plan along with follow-up of her obesity related diagnoses. [name] is on the Category 2 Plan and states she is following her eating plan approximately 50% of the time. [name] states she is doing water exercise for 60-70 minutes 6 times per week.  Today's visit was #: 26 Starting weight: 188 lbs Starting date: 08/22/2020 Today's weight: 174 lbs Today's date: 04/10/2022 Total lbs lost to date: 14 Total lbs lost since last in-office visit: 0  Interim History: [name] has done well with maintaining her weight loss.  She continues to do her water exercises almost every day of the week.  She notes struggling with meeting her protein goals especially at lunch.  Subjective:   1. Vitamin D deficiency [name] is on vitamin D once weekly, and she is doing well with remembering to take it regularly.  Assessment/Plan:   1. Vitamin D deficiency [name] will continue prescription vitamin D, and we will refill for 90 days.  We will recheck labs in 1 to 2 months.  - Vitamin D, Ergocalciferol, (DRISDOL) 1.25 MG (50000 UNIT) CAPS capsule; Take 1 capsule (50,000 Units total) by mouth every 7 (seven) days.  Dispense: 13 capsule; Refill: 0  2. BMI 28.0-28.9,adult  3. Obesity, Beginning BMI 30.34 [name] is currently in the action stage of change. As such, her goal is to continue with weight loss efforts. She has agreed to the Category 2 Plan or following a lower carbohydrate, vegetable and lean protein rich diet plan.   Exercise goals: As is.   Behavioral modification strategies: increasing lean protein intake.  [name] has agreed to follow-up with our clinic in 4 weeks. She was informed of the importance of frequent follow-up visits to maximize her success with intensive lifestyle modifications for her multiple health conditions.   Objective:   Blood pressure 127/76, pulse 81, temperature 98 F (36.7 C), height '5\' 6"'$   (1.676 m), weight 174 lb (78.9 kg), last menstrual period 02/25/1995, SpO2 94 %. Body mass index is 28.08 kg/m.   Lab Results  Component Value Date   CREATININE 0.7 01/20/2022   BUN 17 01/20/2022   NA 140 01/20/2022   K 4.6 01/20/2022   CL 106 01/20/2022   CO2 23 (A) 01/20/2022   Lab Results  Component Value Date   ALT 22 01/20/2022   AST 18 01/20/2022   ALKPHOS 67 01/20/2022   BILITOT 0.4 08/22/2020   Lab Results  Component Value Date   HGBA1C 5.4 01/20/2022   HGBA1C 5.3 01/09/2021   HGBA1C 5.8 (H) 08/22/2020   HGBA1C 5.5 08/27/2015   Lab Results  Component Value Date   INSULIN 6.9 08/22/2020   Lab Results  Component Value Date   TSH 0.01 (A) 01/20/2022   Lab Results  Component Value Date   CHOL 179 01/20/2022   HDL 52 01/20/2022   LDLCALC 111 01/20/2022   TRIG 78 01/20/2022   CHOLHDL 3.9 02/11/2017   Lab Results  Component Value Date   VD25OH 33.9 01/20/2022   VD25OH 39.9 01/09/2021   VD25OH 30.4 08/22/2020   Lab Results  Component Value Date   WBC 6.5 08/22/2020   HGB 14.9 08/22/2020   HCT 45.4 08/22/2020   MCV 87 08/22/2020   PLT 222 02/11/2017   No results found for: "IRON", "TIBC", "FERRITIN"  Attestation Statements:   Reviewed by clinician on day of visit: allergies, medications, problem list, medical history, surgical  history, family history, social history, and previous encounter notes.  I have personally spent 30 minutes total time today in preparation, patient care, and documentation for this visit, including the following: review of clinical lab tests; review of medical tests/procedures/services.   I, Trixie Dredge, am acting as transcriptionist for Dennard Nip, MD.  I have reviewed the above documentation for accuracy and completeness, and I agree with the above. -  Dennard Nip, MD

## 2022-05-05 ENCOUNTER — Encounter: Payer: Self-pay | Admitting: Neurology

## 2022-05-05 ENCOUNTER — Ambulatory Visit (INDEPENDENT_AMBULATORY_CARE_PROVIDER_SITE_OTHER): Payer: Medicare HMO | Admitting: Neurology

## 2022-05-05 VITALS — BP 112/69 | HR 83 | Ht 66.0 in | Wt 186.0 lb

## 2022-05-05 DIAGNOSIS — G4733 Obstructive sleep apnea (adult) (pediatric): Secondary | ICD-10-CM

## 2022-05-05 DIAGNOSIS — M5030 Other cervical disc degeneration, unspecified cervical region: Secondary | ICD-10-CM

## 2022-05-05 NOTE — Progress Notes (Signed)
Provider:  Larey Seat, MD  Primary Care Physician:  Reynold Bowen, MD [place] [address]     Referring Provider: Reynold Bowen, [address] [place],  [place] [address]          Chief Complaint according to patient   Patient presents with:     New Patient (Initial Visit)           HISTORY OF PRESENT ILLNESS:  [name] is a 82 y.o. female patient who is here for revisit 05/05/2022 for  CPAP first visit. .  Chief concern according to patient :  new CPAP, to be seen within 30-90 days of issuing the new machine . "Insurance requires we follow up in a visit within 31-90 days. If you got the machine 12/28 then we must see you in a follow up visit anywhere between 03/24/22-05/22/2022".   [name] is here to follow-up on her new CPAP machine but she states that she had temporarily  reverted back to using her previous model which is still functioning she feels it is more substantial and therefore wants to continue using the older CPAP machine until this breaks or is no longer update ago.  She received a ResMed AirFit 11 .Marland Kitchen  This established patient continued to use her older CPAP machine for  about 3 weeks before the machine could be replaced. The initially delivered machine was broken. and with high compliance and good resolution of apnea. Just last Friday , 05-02-2022 - she got another new CPAP machine, and she uses a nasal pillow. MEDICARE machine.  I am looking here at her downloads and these must reflect the use of the previously on machine and not the new one.  Over the last 30 days she has been 100% compliant user again she has used the machine 6 hours 54 minutes on average.  The AutoSet machine has a setting between a minimum pressure of 5 and a maximum pressure of 12 cm water with 3 cm expiratory pressure relief.  The residual AHI is 1.6/h.  95th percentile pressure need is 8.7 cm water air leakage is mild 14 L a minute.  6 minutes each night have  shown Cheyne-Stokes respiration but this is not of concern.  Her medications have not significantly changed I will go through the list later today to clean out what may have been discontinued she started on asked from myosin for sinus infection, she was briefly on prednisone and loratadine.  10-31-2021: RV for  [name] is a 82 y.o. 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  She had a COVID infection in January 2021 and has had headaches since that - doxepin h as finally helped  . New CPAP ordered.        09-18-2020,Ubrelvy was prescribed by Dr Forde Dandy and has helped greatly.  Some weeks are headache free and others have 2-3 migraines. She has not been on preventives-  she has thoughts about a dental device, but feels that CPAP has served her well.  She has worsening hearing loss.         Review of Systems: Out of a complete 14 system review, the patient complains of only the following symptoms, and all other reviewed systems are negative.:   Her Epworth sleepiness score is today endorsed at 2 points 1 for lying down to rest in the afternoon and 1 point falling  asleep while watching television.  Fatigue severity scale was endorsed at 37 points which is average and she does not feel that fatigue interferes regularly with current carrying out certain duties and responsibilities.  The geriatric depression score was also endorsed at 3 out of 15 points and is not indicative of clinical depression.   Social History   Socioeconomic History   Marital status: Married    Spouse name: [name]   Number of children: 0   Years of education: 14   Highest education level: Not on file  Occupational History   Occupation: Retired Catering manager  Tobacco Use   Smoking status: Never    Passive exposure: Yes   Smokeless tobacco: Never   Tobacco comments:    flight attendant on smoking plane   Vaping Use   Vaping Use: Never used  Substance and Sexual  Activity   Alcohol use: Yes    Alcohol/week: 0.0 standard drinks of alcohol    Comment: 1 glass - occas of vodka   Drug use: No   Sexual activity: Not Currently    Partners: Male    Birth control/protection: Post-menopausal  Other Topics Concern   Not on file  Social History Narrative   Patient is married [name]).   Patient drinks 2-4 cups of coffee daily.   Patient is retired.   Patient has two years of college.   Patient is right-handed.               Social Determinants of Health   Financial Resource Strain: Low Risk  (10/28/2017)   Overall Financial Resource Strain (CARDIA)    Difficulty of Paying Living Expenses: Not hard at all  Food Insecurity: No Food Insecurity (10/28/2017)   Hunger Vital Sign    Worried About Running Out of Food in the Last Year: Never true    Ran Out of Food in the Last Year: Never true  Transportation Needs: No Transportation Needs (10/28/2017)   PRAPARE - Hydrologist (Medical): No    Lack of Transportation (Non-Medical): No  Physical Activity: Sufficiently Active (10/28/2017)   Exercise Vital Sign    Days of Exercise per Week: 4 days    Minutes of Exercise per Session: 60 min  Stress: No Stress Concern Present (10/28/2017)   [place]    Feeling of Stress : Only a little  Social Connections: Moderately Integrated (10/28/2017)   Social Connection and Isolation Panel [NHANES]    Frequency of Communication with Friends and Family: More than three times a week    Frequency of Social Gatherings with Friends and Family: More than three times a week    Attends Religious Services: More than 4 times per year    Active Member of [employer] or Organizations: No    Attends Music therapist: Never    Marital Status: Married    Family History  Problem Relation Age of Onset   Cancer Mother    Thyroid disease Mother    Hypertension Mother    Breast cancer  Mother    Aneurysm Father    Migraines Father    High blood pressure Brother    Melanoma Brother    Kidney cancer Brother        Lesion on kidney   Arrhythmia Brother    Heart attack Paternal Grandmother    Heart attack Paternal Grandfather    Breast cancer Maternal Aunt    Breast cancer Maternal  Aunt    Breast cancer Maternal Aunt    Breast cancer Maternal Aunt    Heart disease Other        Grandmother    Past Medical History:  Diagnosis Date   Anxiety    Cancer ([place])    basal cell skin biopsies X2   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   Chronic fatigue    Constipation    Depression    Depression    Fibromyalgia 09/1996   [place]   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   Hyperlipidemia    Impaired hearing    left ear, wears hearing aids   Insomnia    circadian rhythm component   Insomnia    Migraine 11/1986   [place]   Nuclear sclerosis    OSA on CPAP 03/2005   uses cpap setting of 10   Osteoarthritis 2007   [place]   Pain last week   left under breast pain    Plantar fasciitis    Rheumatic fever    Scarlet fever as child   Sleep apnea    Swallowing difficulty    Thyroid disorder    Vitamin D deficiency    Vitreous degeneration of right eye     Past Surgical History:  Procedure Laterality Date   BREAST BIOPSY Left 6/00   [place]   BREAST EXCISIONAL BIOPSY Left 1998   DE QUERVAIN'S RELEASE Right 10/97   [place]   left breast biopsy     ROOT CANAL  02/11/2012   TONSILLECTOMY  age 85   TONSILLECTOMY AND ADENOIDECTOMY  1952   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     azelastine (ASTELIN) 0.1 %  nasal spray Place into the nose.     azithromycin (ZITHROMAX) 250 MG tablet 2 tablet on the first day, then 1 tablet daily for 4 days Oral daily for 5 days     doxepin (SINEQUAN) 100 MG capsule Take 1 capsule (100 mg total) by mouth at bedtime. 90 capsule 1   doxepin (SINEQUAN) 50 MG capsule Take 1 capsule (50 mg) twice daily. Take 2 (100 mg) capsules at night. 360 capsule 1   Eszopiclone 3 MG TABS Take 3 mg by mouth at bedtime. Take immediately before bedtime     fluticasone (FLONASE) 50 MCG/ACT nasal spray      hydrochlorothiazide (MICROZIDE) 12.5 MG capsule Take 12.5 mg by mouth daily.     levothyroxine (SYNTHROID, LEVOTHROID) 200 MCG tablet Take 200 mcg by mouth daily before breakfast.     loratadine (CLARITIN) 10 MG tablet 1 tablet Orally Once a day for allergies for 90 days     LORazepam (ATIVAN) 1 MG tablet Take 1 tablet (1 mg total) by mouth 2 (two) times daily. 30 tablet 1   lubiprostone (AMITIZA) 24 MCG capsule Take 24 mcg by mouth 2 (two) times daily with a meal.     melatonin 3 MG TABS tablet Take 20 mg by mouth.     montelukast (SINGULAIR) 10 MG tablet TAKE 1 TABLET BY MOUTH EVERY DAY 90 tablet 3   polyethylene glycol (MIRALAX / GLYCOLAX) packet  Take 17 g by mouth daily.     predniSONE (DELTASONE) 10 MG tablet 1 tablet Orally twice a day for 5 days, then 1 daily for 5 days for 10 days     traMADol (ULTRAM) 50 MG tablet Take 1 tablet (50 mg total) by mouth in the morning, at noon, and at bedtime. 90 tablet 4   traMADol (ULTRAM) 50 MG tablet Take 1 tablet (50 mg total) by mouth in the morning, at noon, and at bedtime. 90 tablet 4   UBRELVY 50 MG TABS Take by mouth as needed.     Vitamin D, Ergocalciferol, (DRISDOL) 1.25 MG (50000 UNIT) CAPS capsule Take 1 capsule (50,000 Units total) by mouth every 7 (seven) days. 13 capsule 0   No current facility-administered medications on file prior to visit.    Allergies  Allergen Reactions   Penicillins Anaphylaxis    Tongue swelling    Codeine Nausea Only   Oxycodone-Acetaminophen    Penicillamine    Provigil [Modafinil]    Seroquel [Quetiapine Fumarate]    Xyrem [Oxybate]     hallucination   Ciprofloxacin Rash   Monistat [Miconazole] Rash   Terazol [Terconazole] Rash     DIAGNOSTIC DATA (LABS, IMAGING, TESTING) - I reviewed patient records, labs, notes, testing and imaging myself where available.  Lab Results  Component Value Date   WBC 6.5 08/22/2020   HGB 14.9 08/22/2020   HCT 45.4 08/22/2020   MCV 87 08/22/2020   PLT 222 02/11/2017      Component Value Date/Time   NA 140 01/20/2022 0000   K 4.6 01/20/2022 0000   CL 106 01/20/2022 0000   CO2 23 (A) 01/20/2022 0000   GLUCOSE 84 08/22/2020 1505   GLUCOSE 104 (H) 02/11/2017 0846   BUN 17 01/20/2022 0000   CREATININE 0.7 01/20/2022 0000   CREATININE 0.55 (L) 08/22/2020 1505   CREATININE 0.56 (L) 02/11/2017 0846   CALCIUM 9.5 01/20/2022 0000   PROT 7.4 08/22/2020 1505   ALBUMIN 4.0 01/20/2022 0000   ALBUMIN 4.8 (H) 08/22/2020 1505   AST 18 01/20/2022 0000   ALT 22 01/20/2022 0000   ALKPHOS 67 01/20/2022 0000   BILITOT 0.4 08/22/2020 1505   GFRNONAA 89 08/05/2019 1536   GFRNONAA 91 02/11/2017 0846   GFRAA 103 08/05/2019 1536   GFRAA 105 02/11/2017 0846   Lab Results  Component Value Date   CHOL 179 01/20/2022   HDL 52 01/20/2022   LDLCALC 111 01/20/2022   TRIG 78 01/20/2022   CHOLHDL 3.9 02/11/2017   Lab Results  Component Value Date   HGBA1C 5.4 01/20/2022   Lab Results  Component Value Date   [id number] 02/11/2017   Lab Results  Component Value Date   TSH 0.01 (A) 01/20/2022    PHYSICAL EXAM:  Today's Vitals   05/05/22 1605  BP: 112/69  Pulse: 83  Weight: 186 lb (84.4 kg)  Height: '5\' 6"'$  (1.676 m)   Body mass index is 30.02 kg/m.   Wt Readings from Last 3 Encounters:  05/05/22 186 lb (84.4 kg)  04/15/22 182 lb 12.8 oz (82.9 kg)  04/10/22 174 lb (78.9 kg)     Ht Readings from Last 3 Encounters:  05/05/22 '5\' 6"'$   (1.676 m)  04/15/22 '5\' 6"'$  (1.676 m)  04/10/22 '5\' 6"'$  (1.676 m)      GAttention span & concentration ability appears normal.  Speech is fluent, Mood and affect are appropriate.   Cranial nerves: Pupils are equal and briskly reactive  to light. Funduscopic exam without evidence of pallor or edema. Extraocular movements  in vertical and horizontal planes intact and without nystagmus. Visual fields by finger perimetry are intact. Hearing corrected with hearing  aids.  Facial sensation intact to fine touch. Facial motor strength is symmetric and tongue and uvula move midline. Shoulder shrug was asymmetrical.   Neck and shoulders are stiff.- extremely tense. Tension headaches, neck pain-   Motor exam:  Normal tone, muscle bulk and symmetric strength in all extremities. Sensory:  Fine touch, pinprick and vibration were tested in all extremities. Proprioception tested in the upper extremities was normal. Coordination: Rapid alternating movements in the fingers/hands was normal.  Finger-to-nose maneuver  normal without evidence of ataxia, dysmetria or tremor. Gait and station: Patient walks without assistive device . Deep tendon reflexes: in the  upper and lower extremities are symmetric and intact. I avoided the patella.  Babinski maneuver response is downgoing.     Assessment:  After physical and neurologic examination, review of laboratory studies,  Personal review of imaging studies, reports of other /same  Imaging studies, results of polysomnography and / or neurophysiology testing and pre-existing records as far as provided in visit;       ASSESSMENT AND PLAN 82 y.o. year old female  here with:     1) This established patient continued to use her older CPAP machine for  about 3 weeks before the machine could be replaced. The initially delivered machine was broken.  Now she uses the new one and with high compliance and good resolution of apnea. Just last Friday , 05-02-2022 - she got another  new CPAP machine, and she uses a nasal pillow. MEDICARE machine.   2) recent new bout of sinusitis, recurrent while on a ATB. I asked her to use Mucinex without Decongestant.  Use the CPAP with high humidity and if not improving  take a 2-3 day break from CPAP. Patient should get new supplies now with the use of the new machine.   3) neck pain - she sleeps on ice and feels spasms in the cervical spine. Pain over C 4-6 .     I plan to follow up either personally or through our NP within 6 months.   I would like to thank Reynold Bowen, MD and Reynold Bowen, [address],  [place] [address] for allowing me to meet with and to take care of this pleasant patient.   CC: I will share my notes with PCP .  After spending a total time of  23  minutes face to face and additional time for physical and neurologic examination, review of laboratory studies,  personal review of imaging studies, reports and results of other testing and review of referral information / records as far as provided in visit.    Electronically signed by: Larey Seat, MD 05/05/2022 4:30 PM  [facility] [place] certified by The [employer] of Sleep Medicine and Diplomate of the [employer] of Sleep Medicine. Board certified In Neurology through the [place], Fellow of the [employer] of Neurology. Medical Director of [employer].

## 2022-05-05 NOTE — Patient Instructions (Signed)
Guaifenesin Extended-Release Tablets What is this medication? GUAIFENESIN (gwye FEN e sin) treats cough and chest congestion. It works by thinning and loosening mucus, making it easier to clear from the head, throat, and lungs. It belongs to a group of medications called expectorants. This medicine may be used for other purposes; ask your health care provider or pharmacist if you have questions. COMMON BRAND NAME(S): AllFen, Ambi, Amibid LA, Bidex, Drituss G, Duratuss G, Fenesin, Gua SR, Guaidrine G, Guaifenex G, Guaifenex LA, Humibid, Humibid LA, Liquibid, Mucinex, Muco-Fen, Mucus ER, Mucus Relief, Q-Bid LA, Respa-GF, Ru-Tuss, [employer] EX What should I tell my care team before I take this medication? They need to know if you have any of these conditions: Fever Kidney disease An unusual or allergic reaction to guaifenesin, other medications, foods, dyes, or preservatives Pregnant or trying to get pregnant Breast-feeding How should I use this medication? Take this medication by mouth with a full glass of water. Follow the directions on the prescription label. Do not break, chew or crush this medication. You may take with food or on an empty stomach. Take your medication at regular intervals. Do not take your medication more often than directed. Talk to your care team about the use of this medication in children. While this medication may be prescribed for children as young as 71 years old for selected conditions, precautions do apply. Overdosage: If you think you have taken too much of this medicine contact a poison control center or emergency room at once. NOTE: This medicine is only for you. Do not share this medicine with others. What if I miss a dose? If you miss a dose, take it as soon as you can. If it is almost time for your next dose, take only that dose. Do not take double or extra doses. What may interact with this medication? Interactions are not expected. This list may not describe all  possible interactions. Give your health care provider a list of all the medicines, herbs, non-prescription drugs, or dietary supplements you use. Also tell them if you smoke, drink alcohol, or use illegal drugs. Some items may interact with your medicine. What should I watch for while using this medication? Do not treat a cough for more than 1 week without consulting your care team. If you also have a high fever, skin rash, continuing headache, or sore throat, see your care team. For best results, drink 6 to 8 glasses water daily while you are taking this medication. What side effects may I notice from receiving this medication? Side effects that you should report to your care team as soon as possible: Allergic reactions--skin rash, itching, hives, swelling of the face, lips, tongue, or throat Side effects that usually do not require medical attention (report to your care team if they continue or are bothersome): Dizziness Drowsiness Headache This list may not describe all possible side effects. Call your doctor for medical advice about side effects. You may report side effects to FDA at 1-800-FDA-1088. Where should I keep my medication? Keep out of the reach of children. Store at room temperature between 20 and 25 degrees C (68 and 77 degrees F). Keep container tightly closed. Throw away any unused medication after the expiration date. NOTE: This sheet is a summary. It may not cover all possible information. If you have questions about this medicine, talk to your doctor, pharmacist, or health care provider.  2023 Elsevier/Gold Standard (2007-04-03 00:00:00)

## 2022-05-08 ENCOUNTER — Encounter (INDEPENDENT_AMBULATORY_CARE_PROVIDER_SITE_OTHER): Payer: Self-pay | Admitting: Family Medicine

## 2022-05-08 ENCOUNTER — Ambulatory Visit (INDEPENDENT_AMBULATORY_CARE_PROVIDER_SITE_OTHER): Payer: Medicare HMO | Admitting: Family Medicine

## 2022-05-08 VITALS — BP 125/81 | HR 74 | Temp 97.9°F | Ht 66.0 in | Wt 176.0 lb

## 2022-05-08 DIAGNOSIS — E669 Obesity, unspecified: Secondary | ICD-10-CM | POA: Diagnosis not present

## 2022-05-08 DIAGNOSIS — E559 Vitamin D deficiency, unspecified: Secondary | ICD-10-CM

## 2022-05-08 DIAGNOSIS — Z6828 Body mass index (BMI) 28.0-28.9, adult: Secondary | ICD-10-CM

## 2022-05-08 DIAGNOSIS — J3089 Other allergic rhinitis: Secondary | ICD-10-CM | POA: Diagnosis not present

## 2022-05-14 NOTE — Progress Notes (Signed)
Chief Complaint:   OBESITY [name] is here to discuss her progress with her obesity treatment plan along with follow-up of her obesity related diagnoses. [name] is on the Category 2 Plan and following a lower carbohydrate, vegetable and lean protein rich diet plan and states she is following her eating plan approximately 30% of the time. [name] states she is doing 0 minutes 0 times per week.  Today's visit was #: 28 Starting weight: 188 lbs Starting date: 08/22/2020 Today's weight: 176 lbs Today's date: 05/08/2022 Total lbs lost to date: 12 Total lbs lost since last in-office visit: 0  Interim History: [name] has been sick lately with a cold that went into sinusitis and bronchitis.  She still feels weak after antibiotics and prednisone, but she is starting to feel better.  She is retaining a little bit of fluid today.  Subjective:   1. Vitamin D deficiency [name] is on vitamin D prescription weekly.  Her recent vitamin D level was not yet at goal, and she notes fatigue.  2. Seasonal allergic rhinitis due to other allergic trigger [name] is on Claritin and Flonase, but she is taking it as needed.  Her symptoms are worsened with increased pollen.  Assessment/Plan:   1. Vitamin D deficiency [name] will continue vitamin D prescription, and we will recheck labs in 1 month.  2. Seasonal allergic rhinitis due to other allergic trigger [name] was encouraged to take her Flonase daily and her Claritin as needed.  3. BMI 28.0-28.9,adult  4. Obesity, Beginning BMI 30.34 [name] is currently in the action stage of change. As such, her goal is to continue with weight loss efforts. She has agreed to the Category 2 Plan.   Behavioral modification strategies: increasing lean protein intake.  [name] has agreed to follow-up with our clinic in 4 weeks. She was informed of the importance of frequent follow-up visits to maximize her success with intensive lifestyle modifications for her multiple health conditions.    Objective:   Blood pressure 125/81, pulse 74, temperature 97.9 F (36.6 C), height 5\' 6"  (1.676 m), weight 176 lb (79.8 kg), last menstrual period 02/25/1995, SpO2 94 %. Body mass index is 28.41 kg/m.  Lab Results  Component Value Date   CREATININE 0.7 01/20/2022   BUN 17 01/20/2022   NA 140 01/20/2022   K 4.6 01/20/2022   CL 106 01/20/2022   CO2 23 (A) 01/20/2022   Lab Results  Component Value Date   ALT 22 01/20/2022   AST 18 01/20/2022   ALKPHOS 67 01/20/2022   BILITOT 0.4 08/22/2020   Lab Results  Component Value Date   HGBA1C 5.4 01/20/2022   HGBA1C 5.3 01/09/2021   HGBA1C 5.8 (H) 08/22/2020   HGBA1C 5.5 08/27/2015   Lab Results  Component Value Date   INSULIN 6.9 08/22/2020   Lab Results  Component Value Date   TSH 0.01 (A) 01/20/2022   Lab Results  Component Value Date   CHOL 179 01/20/2022   HDL 52 01/20/2022   LDLCALC 111 01/20/2022   TRIG 78 01/20/2022   CHOLHDL 3.9 02/11/2017   Lab Results  Component Value Date   VD25OH 33.9 01/20/2022   VD25OH 39.9 01/09/2021   VD25OH 30.4 08/22/2020   Lab Results  Component Value Date   WBC 6.5 08/22/2020   HGB 14.9 08/22/2020   HCT 45.4 08/22/2020   MCV 87 08/22/2020   PLT 222 02/11/2017   No results found for: "IRON", "TIBC", "FERRITIN"  Attestation Statements:  Reviewed by clinician on day of visit: allergies, medications, problem list, medical history, surgical history, family history, social history, and previous encounter notes.   I, Trixie Dredge, am acting as transcriptionist for Dennard Nip, MD.  I have reviewed the above documentation for accuracy and completeness, and I agree with the above. -  Dennard Nip, MD

## 2022-05-30 ENCOUNTER — Other Ambulatory Visit: Payer: Self-pay | Admitting: Sports Medicine

## 2022-05-30 DIAGNOSIS — M542 Cervicalgia: Secondary | ICD-10-CM

## 2022-05-30 DIAGNOSIS — M5030 Other cervical disc degeneration, unspecified cervical region: Secondary | ICD-10-CM

## 2022-06-05 ENCOUNTER — Ambulatory Visit (INDEPENDENT_AMBULATORY_CARE_PROVIDER_SITE_OTHER): Payer: Medicare HMO | Admitting: Family Medicine

## 2022-06-05 ENCOUNTER — Other Ambulatory Visit: Payer: Self-pay | Admitting: Endocrinology

## 2022-06-05 DIAGNOSIS — Z1231 Encounter for screening mammogram for malignant neoplasm of breast: Secondary | ICD-10-CM

## 2022-07-01 ENCOUNTER — Ambulatory Visit (INDEPENDENT_AMBULATORY_CARE_PROVIDER_SITE_OTHER): Payer: Medicare HMO | Admitting: Physician Assistant

## 2022-07-03 ENCOUNTER — Other Ambulatory Visit: Payer: Self-pay | Admitting: Sports Medicine

## 2022-07-03 ENCOUNTER — Ambulatory Visit (INDEPENDENT_AMBULATORY_CARE_PROVIDER_SITE_OTHER): Payer: Medicare HMO | Admitting: Sports Medicine

## 2022-07-03 VITALS — BP 122/70 | Ht 66.0 in

## 2022-07-03 DIAGNOSIS — M5030 Other cervical disc degeneration, unspecified cervical region: Secondary | ICD-10-CM

## 2022-07-03 DIAGNOSIS — M542 Cervicalgia: Secondary | ICD-10-CM

## 2022-07-03 DIAGNOSIS — M47812 Spondylosis without myelopathy or radiculopathy, cervical region: Secondary | ICD-10-CM | POA: Diagnosis not present

## 2022-07-03 MED ORDER — TRAMADOL HCL 50 MG PO TABS: 50.0000 mg | ORAL_TABLET | Freq: Three times a day (TID) | ORAL | 4 refills | Status: DC

## 2022-07-03 MED ORDER — DOXEPIN HCL 50 MG PO CAPS: ORAL_CAPSULE | ORAL | 1 refills | Status: DC

## 2022-07-03 MED ORDER — DOXEPIN HCL 100 MG PO CAPS: 100.0000 mg | ORAL_CAPSULE | Freq: Every day | ORAL | 1 refills | Status: DC

## 2022-07-03 NOTE — Progress Notes (Signed)
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  Patient comes in doing very well Her last headache was 2 months ago This occurred when she had missed some of her medication which is doxepin and tramadol while she was traveling She continues on the same dose of doxepin and tramadol without any side effects or problems She has been able to resume her water exercises that were using for physical therapy She is going as often as 5 days a week  She has had knee replacements and periodically gets some knee pain but the water exercise helps this  Physical exam Pleasant white female in no acute distress BP 122/70   Ht 5\' 6"  (1.676 m)   LMP 02/25/1995 (Approximate)   BMI 28.41 kg/m   Neck range of motion shows rotation of only about 20 degrees to the right and only 30 degrees to the left She does have full flexion Extension is limited to just a few degrees Lateral bend on the right is about 20 degrees but only 5 to 10 degrees on the left  Neurologic testing from C5-T1 is normal with normal strength and no sensory change

## 2022-07-03 NOTE — Assessment & Plan Note (Signed)
She continues to do very well on a stable program of medications We renewed her medications today We will keep up neck range of motion exercises Continue physical therapy for her knees in the water classes  I will check her again every 3 months

## 2022-07-14 ENCOUNTER — Encounter (INDEPENDENT_AMBULATORY_CARE_PROVIDER_SITE_OTHER): Payer: Self-pay | Admitting: Family Medicine

## 2022-07-14 ENCOUNTER — Ambulatory Visit
Admission: RE | Admit: 2022-07-14 | Discharge: 2022-07-14 | Disposition: A | Discharge status: Home / Self Care | Payer: Medicare HMO | Source: Ambulatory Visit | Attending: Endocrinology | Admitting: Endocrinology

## 2022-07-14 DIAGNOSIS — Z1231 Encounter for screening mammogram for malignant neoplasm of breast: Secondary | ICD-10-CM

## 2022-07-15 ENCOUNTER — Other Ambulatory Visit (INDEPENDENT_AMBULATORY_CARE_PROVIDER_SITE_OTHER): Payer: Self-pay | Admitting: Family Medicine

## 2022-07-15 DIAGNOSIS — E559 Vitamin D deficiency, unspecified: Secondary | ICD-10-CM

## 2022-07-15 MED ORDER — VITAMIN D (ERGOCALCIFEROL) 1.25 MG (50000 UNIT) PO CAPS: 50000.0000 [IU] | ORAL_CAPSULE | ORAL | 0 refills | Status: DC

## 2022-07-17 ENCOUNTER — Ambulatory Visit (INDEPENDENT_AMBULATORY_CARE_PROVIDER_SITE_OTHER): Payer: Medicare HMO | Admitting: Physician Assistant

## 2022-07-17 ENCOUNTER — Encounter (INDEPENDENT_AMBULATORY_CARE_PROVIDER_SITE_OTHER): Payer: Self-pay | Admitting: Physician Assistant

## 2022-07-17 VITALS — BP 128/81 | HR 76 | Temp 98.1°F | Ht 66.0 in | Wt 180.0 lb

## 2022-07-17 DIAGNOSIS — E669 Obesity, unspecified: Secondary | ICD-10-CM

## 2022-07-17 DIAGNOSIS — E559 Vitamin D deficiency, unspecified: Secondary | ICD-10-CM | POA: Diagnosis not present

## 2022-07-17 DIAGNOSIS — Z6829 Body mass index (BMI) 29.0-29.9, adult: Secondary | ICD-10-CM | POA: Diagnosis not present

## 2022-07-17 MED ORDER — VITAMIN D (ERGOCALCIFEROL) 1.25 MG (50000 UNIT) PO CAPS: 50000.0000 [IU] | ORAL_CAPSULE | ORAL | 0 refills | Status: DC

## 2022-07-17 NOTE — Progress Notes (Signed)
.smr  Office: [phone]  /  Fax: [phone]  WEIGHT SUMMARY AND BIOMETRICS  Vitals Temp: 98.1 F (36.7 C) BP: 128/81 Pulse Rate: 76 SpO2: 96 %   Anthropometric Measurements Height: 5\' 6"  (1.676 m) Weight: 180 lb (81.6 kg) BMI (Calculated): 29.07 Weight at Last Visit: 176 lb Weight Lost Since Last Visit: 0 Weight Gained Since Last Visit: 4 lb Starting Weight: 188 lb   Body Composition  Body Fat %: 41.3 % Fat Mass (lbs): 74.6 lbs Muscle Mass (lbs): 100.8 lbs Total Body Water (lbs): 69.2 lbs Visceral Fat Rating : 13   Other Clinical Data Labs: No Today's Visit #: 2 Starting Date: 08/22/20     HPI  Chief Complaint: OBESITY  [name] is here to discuss her progress with her obesity treatment plan. She is on the the Category 2 Plan and states she is following her eating plan approximately 20 % of the time. She states she is exercising/pool/walking 7 times per week.   Interval History:  Since last office visit she is up 4 lbs. She has continued to recover from a serious URI in March and is gradually improving, feeling less fatigue.  Bio impedence scale: Down 1.2 lb muscle mass, Up 5.2 lbs adipose, water down 1.6lb  Hunger/appetite-Not excessive, Brkfast- Omelet Lunch- Sandwich/Apple  Dinner- 6 oz chicken and 2 cups broccoli   Cravings- some in evenings after dinner. Skinny pop and yasso, but has difficulty stopping at 1 bag or bar.  Discussed trying some 100 calorie higher protein snack after dinner  Stress- manageable.  Exercise-Goes to pool and gets in 10K steps over 60 minutes 6 days per week.   Has husband's 60 th school reunion in [place] soon.   Appt with Dr. [facility] and will have lab work done to follow up thyroid and would like other labs done at that time.    Pharmacotherapy: None for weight loss.   TREATMENT PLAN FOR OBESITY:  Recommended Dietary Goals  [name] is currently in the action stage of change. As such, her goal  is to continue weight management plan. She has agreed to the Category 2 Plan.  Behavioral Intervention  We discussed the following Behavioral Modification Strategies today: increasing lean protein intake, decreasing simple carbohydrates , increasing vegetables, increasing lower glycemic fruits, increasing fiber rich foods, avoiding skipping meals, increasing water intake, continue to practice mindfulness when eating, and planning for success.  Additional resources provided today: 100 Calorie protein snack hand out       Lunch options   Recommended Physical Activity Goals  [name] has been advised to work up to 150 minutes of moderate intensity aerobic activity a week and strengthening exercises 2-3 times per week for cardiovascular health, weight loss maintenance and preservation of muscle mass.   She has agreed to Continue current level of physical activity    Pharmacotherapy We discussed various medication options to help [name] with her weight loss efforts and we both agreed to continue to work on nutritional and behavioral strategies to promote weight loss.     Return in about 2 weeks (around 07/31/2022).Marland Kitchen She was informed of the importance of frequent follow up visits to maximize her success with intensive lifestyle modifications for her multiple health conditions.  PHYSICAL EXAM:  Blood pressure 128/81, pulse 76, temperature 98.1 F (36.7 C), height 5\' 6"  (1.676 m), weight 180 lb (81.6 kg), last menstrual period 02/25/1995, SpO2 96 %. Body mass index is 29.05 kg/m.  General: She is overweight, cooperative, alert,  well developed, and in no acute distress. PSYCH: Has normal mood, affect and thought process.   Cardiovascular : HR 70's regular Lungs: Normal breathing effort, no conversational dyspnea. Neuro: no focal deficits  DIAGNOSTIC DATA REVIEWED:  BMET    Component Value Date/Time   NA 140 01/20/2022 0000   K 4.6 01/20/2022 0000   CL 106 01/20/2022 0000   CO2 23 (A)  01/20/2022 0000   GLUCOSE 84 08/22/2020 1505   GLUCOSE 104 (H) 02/11/2017 0846   BUN 17 01/20/2022 0000   CREATININE 0.7 01/20/2022 0000   CREATININE 0.55 (L) 08/22/2020 1505   CREATININE 0.56 (L) 02/11/2017 0846   CALCIUM 9.5 01/20/2022 0000   GFRNONAA 89 08/05/2019 1536   GFRNONAA 91 02/11/2017 0846   GFRAA 103 08/05/2019 1536   GFRAA 105 02/11/2017 0846   Lab Results  Component Value Date   HGBA1C 5.4 01/20/2022   HGBA1C 5.5 08/27/2015   Lab Results  Component Value Date   INSULIN 6.9 08/22/2020   Lab Results  Component Value Date   TSH 0.01 (A) 01/20/2022   CBC    Component Value Date/Time   WBC 6.5 08/22/2020 1505   WBC 6.4 02/11/2017 0846   RBC 5.20 08/22/2020 1505   RBC 4.74 02/11/2017 0846   HGB 14.9 08/22/2020 1505   HCT 45.4 08/22/2020 1505   PLT 222 02/11/2017 0846   PLT 210 08/27/2015 0938   MCV 87 08/22/2020 1505   MCH 28.7 08/22/2020 1505   MCH 28.5 02/11/2017 0846   MCHC 32.8 08/22/2020 1505   MCHC 32.9 02/11/2017 0846   RDW 13.0 08/22/2020 1505   Iron Studies No results found for: "IRON", "TIBC", "FERRITIN", "IRONPCTSAT" Lipid Panel     Component Value Date/Time   CHOL 179 01/20/2022 0000   CHOL 158 08/27/2015 0938   TRIG 78 01/20/2022 0000   HDL 52 01/20/2022 0000   HDL 48 08/27/2015 0938   CHOLHDL 3.9 02/11/2017 0846   LDLCALC 111 01/20/2022 0000   LDLCALC 94 02/11/2017 0846   Hepatic Function Panel     Component Value Date/Time   PROT 7.4 08/22/2020 1505   ALBUMIN 4.0 01/20/2022 0000   ALBUMIN 4.8 (H) 08/22/2020 1505   AST 18 01/20/2022 0000   ALT 22 01/20/2022 0000   ALKPHOS 67 01/20/2022 0000   BILITOT 0.4 08/22/2020 1505   BILIDIR 0.2 01/09/2021 0000      Component Value Date/Time   TSH 0.01 (A) 01/20/2022 0000   TSH <0.005 (L) 08/22/2020 1505   Nutritional Lab Results  Component Value Date   VD25OH 33.9 01/20/2022   VD25OH 39.9 01/09/2021   VD25OH 30.4 08/22/2020    ASSOCIATED CONDITIONS ADDRESSED  TODAY  ASSESSMENT AND PLAN  Problem List Items Addressed This Visit     Vitamin D deficiency   Relevant Medications   Vitamin D, Ergocalciferol, (DRISDOL) 1.25 MG (50000 UNIT) CAPS capsule   Vitamin D Deficiency Vitamin D is not at goal of 50.  Most recent vitamin D level was 33.9. She is on  prescription ergocalciferol 50,000 IU weekly. Lab Results  Component Value Date   VD25OH 33.9 01/20/2022   VD25OH 39.9 01/09/2021   VD25OH 30.4 08/22/2020    Plan: Continue and refill  prescription ergocalciferol 50,000 IU weekly Low vitamin D levels can be associated with adiposity and may result in leptin resistance and weight gain. Also associated with fatigue. Currently on vitamin D supplementation without any adverse effects.  She plans to have labs rechecked with appointment  with Dr. Evlyn Kanner.   ATTESTASTION STATEMENTS:  Reviewed by clinician on day of visit: allergies, medications, problem list, medical history, surgical history, family history, social history, and previous encounter notes.   I have personally spent 45 minutes total time today in preparation, patient care, nutritional counseling and documentation for this visit, including the following: review of clinical lab tests; review of medical tests/procedures/services.      Melady Chow, PA-C

## 2022-07-23 LAB — BASIC METABOLIC PANEL
BUN: 19 (ref 4–21)
CO2: 26 — AB (ref 13–22)
Chloride: 103 (ref 99–108)
Creatinine: 0.6 (ref 0.5–1.1)
Glucose: 100
Potassium: 4.2 mEq/L (ref 3.5–5.1)
Sodium: 142 (ref 137–147)

## 2022-07-23 LAB — CBC AND DIFFERENTIAL
HCT: 45 (ref 36–46)
Hemoglobin: 14.8 (ref 12.0–16.0)
Neutrophils Absolute: 3.1
Platelets: 194 10*3/uL (ref 150–400)
WBC: 4.9

## 2022-07-23 LAB — COMPREHENSIVE METABOLIC PANEL
Albumin: 4.1 (ref 3.5–5.0)
Calcium: 9.2 (ref 8.7–10.7)
Globulin: 2.2
eGFR: 90

## 2022-07-23 LAB — HEMOGLOBIN A1C: Hemoglobin A1C: 5.4

## 2022-07-23 LAB — HEPATIC FUNCTION PANEL
ALT: 19 U/L (ref 7–35)
AST: 18 (ref 13–35)
Alkaline Phosphatase: 72 (ref 25–125)
Bilirubin, Total: 0.5

## 2022-07-23 LAB — LIPID PANEL
Cholesterol: 170 (ref 0–200)
HDL: 52 (ref 35–70)
LDL Cholesterol: 102
Triglycerides: 83 (ref 40–160)

## 2022-07-23 LAB — TSH: TSH: 0.01 — AB (ref 0.41–5.90)

## 2022-07-23 LAB — VITAMIN B12: Vitamin B-12: 1380

## 2022-07-23 LAB — CBC: RBC: 4.9 (ref 3.87–5.11)

## 2022-07-29 ENCOUNTER — Ambulatory Visit (INDEPENDENT_AMBULATORY_CARE_PROVIDER_SITE_OTHER): Payer: Medicare HMO | Admitting: Physician Assistant

## 2022-08-12 ENCOUNTER — Ambulatory Visit (INDEPENDENT_AMBULATORY_CARE_PROVIDER_SITE_OTHER): Payer: Medicare HMO | Admitting: Physician Assistant

## 2022-09-01 ENCOUNTER — Encounter (INDEPENDENT_AMBULATORY_CARE_PROVIDER_SITE_OTHER): Payer: Self-pay | Admitting: Family Medicine

## 2022-09-03 ENCOUNTER — Encounter (INDEPENDENT_AMBULATORY_CARE_PROVIDER_SITE_OTHER): Payer: Self-pay | Admitting: Family Medicine

## 2022-09-08 ENCOUNTER — Encounter (INDEPENDENT_AMBULATORY_CARE_PROVIDER_SITE_OTHER): Payer: Self-pay

## 2022-09-08 ENCOUNTER — Ambulatory Visit (INDEPENDENT_AMBULATORY_CARE_PROVIDER_SITE_OTHER): Payer: Medicare HMO | Admitting: Physician Assistant

## 2022-09-18 ENCOUNTER — Telehealth (INDEPENDENT_AMBULATORY_CARE_PROVIDER_SITE_OTHER): Payer: Self-pay | Admitting: Family Medicine

## 2022-09-18 ENCOUNTER — Other Ambulatory Visit (INDEPENDENT_AMBULATORY_CARE_PROVIDER_SITE_OTHER): Payer: Self-pay | Admitting: Family Medicine

## 2022-09-18 DIAGNOSIS — E559 Vitamin D deficiency, unspecified: Secondary | ICD-10-CM

## 2022-09-18 NOTE — Telephone Encounter (Signed)
Patient stated she really needs her Vitamin D filled. I explained that she needs an appointment and be at her appointment to refill any medication. She refused and told me to ask Dr.Beasley. Please advise, thanks!

## 2022-09-22 NOTE — Telephone Encounter (Signed)
Mrs Delay,  It looks like you are significantly overdue for your next visit. I hope everything is ok. Our office policy is we generally do not refill prescriptions between visits. Can we get your scheduled for a telehealth

## 2022-10-02 ENCOUNTER — Ambulatory Visit: Payer: Medicare HMO | Admitting: Sports Medicine

## 2022-10-14 LAB — LAB REPORT - SCANNED
A1c: 5.4
EGFR: 80.1

## 2022-10-16 ENCOUNTER — Ambulatory Visit (INDEPENDENT_AMBULATORY_CARE_PROVIDER_SITE_OTHER): Payer: Medicare HMO | Admitting: Family Medicine

## 2022-10-16 ENCOUNTER — Encounter (INDEPENDENT_AMBULATORY_CARE_PROVIDER_SITE_OTHER): Payer: Self-pay | Admitting: Family Medicine

## 2022-10-16 VITALS — BP 117/79 | HR 81 | Temp 97.8°F | Ht 66.0 in | Wt 183.0 lb

## 2022-10-16 DIAGNOSIS — E559 Vitamin D deficiency, unspecified: Secondary | ICD-10-CM

## 2022-10-16 DIAGNOSIS — Z6829 Body mass index (BMI) 29.0-29.9, adult: Secondary | ICD-10-CM

## 2022-10-16 DIAGNOSIS — E669 Obesity, unspecified: Secondary | ICD-10-CM

## 2022-10-16 MED ORDER — VITAMIN D (ERGOCALCIFEROL) 1.25 MG (50000 UNIT) PO CAPS
50000.0000 [IU] | ORAL_CAPSULE | ORAL | 0 refills | Status: DC
Start: 2022-10-16 — End: 2022-12-04

## 2022-10-20 NOTE — Progress Notes (Signed)
Chief Complaint:   OBESITY Nicollette is here to discuss her progress with her obesity treatment plan along with follow-up of her obesity related diagnoses. Tationa is on the Category 2 Plan and states she is following her eating plan approximately 30% of the time. Amyia states she is walking 11,000 steps 6 times per week.    Today's visit was #: 30 Starting weight: 188 lbs Starting date: 08/22/2020 Today's weight: 183 lbs Today's date: 10/16/2022 Total lbs lost to date: 5 Total lbs lost since last in-office visit: 0  Interim History: Patient has been sick most of the summer and she has been on multiple doses of prednisone and antibiotics.  She is working on getting back on track.  Subjective:   1. Vitamin D deficiency Patient is out of vitamin D, and she had labs done at her PCP but it is not in Epic.  Her last vitamin D level was below goal.  Assessment/Plan:   1. Vitamin D deficiency We will refill prescription vitamin D once weekly for 90 days.  Patient is to get her lab results from her PCP, and we will follow-up at her next visit.  - Vitamin D, Ergocalciferol, (DRISDOL) 1.25 MG (50000 UNIT) CAPS capsule; Take 1 capsule (50,000 Units total) by mouth every 7 (seven) days.  Dispense: 13 capsule; Refill: 0  2. BMI 29.0-29.9,adult  3. Obesity- Start BMI 30.34 Ellieanna is currently in the action stage of change. As such, her goal is to continue with weight loss efforts. She has agreed to change to the Category 2 Plan.   Exercise goals: As is.   Behavioral modification strategies: increasing lean protein intake, increasing water intake, and meal planning and cooking strategies.  Kristyna has agreed to follow-up with our clinic in 4 weeks. She was informed of the importance of frequent follow-up visits to maximize her success with intensive lifestyle modifications for her multiple health conditions.   Objective:   Blood pressure 117/79, pulse 81, temperature 97.8 F (36.6 C), height 5\' 6"   (1.676 m), weight 183 lb (83 kg), last menstrual period 02/25/1995, SpO2 94%. Body mass index is 29.54 kg/m.  Lab Results  Component Value Date   CREATININE 0.6 07/23/2022   BUN 19 07/23/2022   NA 142 07/23/2022   K 4.2 07/23/2022   CL 103 07/23/2022   CO2 26 (A) 07/23/2022   Lab Results  Component Value Date   ALT 19 07/23/2022   AST 18 07/23/2022   ALKPHOS 72 07/23/2022   BILITOT 0.4 08/22/2020   Lab Results  Component Value Date   HGBA1C 5.4 07/23/2022   HGBA1C 5.4 01/20/2022   HGBA1C 5.3 01/09/2021   HGBA1C 5.8 (H) 08/22/2020   HGBA1C 5.5 08/27/2015   Lab Results  Component Value Date   INSULIN 6.9 08/22/2020   Lab Results  Component Value Date   TSH 0.01 (A) 07/23/2022   Lab Results  Component Value Date   CHOL 170 07/23/2022   HDL 52 07/23/2022   LDLCALC 102 07/23/2022   TRIG 83 07/23/2022   CHOLHDL 3.9 02/11/2017   Lab Results  Component Value Date   VD25OH 33.9 01/20/2022   VD25OH 39.9 01/09/2021   VD25OH 30.4 08/22/2020   Lab Results  Component Value Date   WBC 4.9 07/23/2022   HGB 14.8 07/23/2022   HCT 45 07/23/2022   MCV 87 08/22/2020   PLT 194 07/23/2022   No results found for: "IRON", "TIBC", "FERRITIN"  Attestation Statements:   Reviewed by  clinician on day of visit: allergies, medications, problem list, medical history, surgical history, family history, social history, and previous encounter notes.  Time spent on visit including pre-visit chart review and post-visit care and charting was 45 minutes.   I, Burt Knack, am acting as transcriptionist for Quillian Quince, MD.  I have reviewed the above documentation for accuracy and completeness, and I agree with the above. -  Quillian Quince, MD

## 2022-10-23 ENCOUNTER — Ambulatory Visit (INDEPENDENT_AMBULATORY_CARE_PROVIDER_SITE_OTHER): Payer: Medicare HMO | Admitting: Sports Medicine

## 2022-10-23 VITALS — BP 112/78 | Ht 66.0 in | Wt 188.0 lb

## 2022-10-23 DIAGNOSIS — M5030 Other cervical disc degeneration, unspecified cervical region: Secondary | ICD-10-CM

## 2022-10-23 NOTE — Progress Notes (Signed)
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  Patient has been on a program for several years now using Sinequan and tramadol to control her cervicogenic headaches She states that she has not had a migraine headache at any time in the last 3 months since I saw her 1 time they were incapacitating and would take her out of activity for several days a week  She has had some minor headaches if she has been too active or moving her neck too much  She is back in a good aquatic therapy program 4 to 5 days a week She continues with walking  Currently no radicular symptoms into either arm  Physical exam Pleasant white female in no acute distress BP 112/78   Ht 5\' 6"  (1.676 m)   Wt 188 lb (85.3 kg)   LMP 02/25/1995 (Approximate)   BMI 30.34 kg/m   Neck today shows improved extension and flexion Posture is improved as well Rotation is still limited Lateral bending is still limited No pain noted with testing  Neurologic testing of C5-T1 was normal in both upper extremities

## 2022-10-23 NOTE — Assessment & Plan Note (Signed)
She is currently doing very well with her tramadol 50 3 times daily and Sinequan 50 twice daily and 100 mg at at bedtime These 2 medications have essentially blocked her from having all but an extremely rare migraine headache which I think was triggered by the degenerative disc disease causing pain in the past  She will continue on the physical therapy programs and activity levels with aquatic therapy  Continue on the same medications  See me in 3 months

## 2022-11-04 ENCOUNTER — Telehealth (INDEPENDENT_AMBULATORY_CARE_PROVIDER_SITE_OTHER): Payer: Self-pay | Admitting: *Deleted

## 2022-11-04 NOTE — Telephone Encounter (Signed)
Called patient, informed her per Dr. Dalbert Garnet she should start taking Vitamin D 2k daily and recheck her levels in 3 months. Patient verbalized understanding and agreed.

## 2022-12-04 ENCOUNTER — Encounter (INDEPENDENT_AMBULATORY_CARE_PROVIDER_SITE_OTHER): Payer: Self-pay | Admitting: Family Medicine

## 2022-12-04 ENCOUNTER — Ambulatory Visit (INDEPENDENT_AMBULATORY_CARE_PROVIDER_SITE_OTHER): Payer: Medicare HMO | Admitting: Family Medicine

## 2022-12-04 VITALS — BP 125/75 | HR 78 | Temp 97.9°F | Ht 66.0 in | Wt 181.0 lb

## 2022-12-04 DIAGNOSIS — E669 Obesity, unspecified: Secondary | ICD-10-CM | POA: Diagnosis not present

## 2022-12-04 DIAGNOSIS — Z6829 Body mass index (BMI) 29.0-29.9, adult: Secondary | ICD-10-CM | POA: Diagnosis not present

## 2022-12-04 DIAGNOSIS — E559 Vitamin D deficiency, unspecified: Secondary | ICD-10-CM

## 2022-12-04 MED ORDER — VITAMIN D (ERGOCALCIFEROL) 1.25 MG (50000 UNIT) PO CAPS: 50000.0000 [IU] | ORAL_CAPSULE | ORAL | 0 refills | Status: DC

## 2022-12-04 NOTE — Progress Notes (Signed)
.smr  Office: [phone]  /  Fax: [phone]  WEIGHT SUMMARY AND BIOMETRICS  Anthropometric Measurements Height: 5\' 6"  (1.676 m) Weight: 181 lb (82.1 kg) BMI (Calculated): 29.23 Weight at Last Visit: 183 lb Weight Lost Since Last Visit: 2 lb Weight Gained Since Last Visit: 0 Starting Weight: 188 lb Total Weight Loss (lbs): 7 lb (3.175 kg)   Body Composition  Body Fat %: 41.7 % Fat Mass (lbs): 75.4 lbs Muscle Mass (lbs): 100.2 lbs Total Body Water (lbs): 69.6 lbs Visceral Fat Rating : 13   Other Clinical Data Fasting: No Labs: No Today's Visit #: 30 Starting Date: 08/22/20    Chief Complaint: OBESITY   History of Present Illness   The patient, with a known history of obesity and vitamin D deficiency, presents for a follow-up visit. She reports adherence to the prescribed vitamin D regimen without any side effects. She has been following the category two dietary plan approximately 60% of the time and has lost two pounds in the last six weeks. She continues to engage in physical activity, including walking and pool workouts, for about 60-90 minutes, five to six times per week.  The patient expresses a sense of determination and commitment to her weight loss journey, despite some frustration with dietary monotony, particularly with [place]. She is considering incorporating rotisserie chicken into her diet for variety. She reports that she is not experiencing hunger when adhering to the dietary plan.  The patient also discusses her struggle with carbohydrate cravings and portion control with fruits, particularly blueberries. She is considering incorporating high protein Wheaties cereal into her diet, using [employer]life milk due to its higher protein and lower sugar content compared to regular milk.  The patient also raises concerns about her insurance billing, expressing confusion and frustration with the high costs. She expresses a desire to understand her insurance billing  better.          PHYSICAL EXAM:  Blood pressure 125/75, pulse 78, temperature 97.9 F (36.6 C), height 5\' 6"  (1.676 m), weight 181 lb (82.1 kg), last menstrual period 02/25/1995, SpO2 93%. Body mass index is 29.21 kg/m.  DIAGNOSTIC DATA REVIEWED:  BMET    Component Value Date/Time   NA 142 07/23/2022 0000   K 4.2 07/23/2022 0000   CL 103 07/23/2022 0000   CO2 26 (A) 07/23/2022 0000   GLUCOSE 84 08/22/2020 1505   GLUCOSE 104 (H) 02/11/2017 0846   BUN 19 07/23/2022 0000   CREATININE 0.6 07/23/2022 0000   CREATININE 0.55 (L) 08/22/2020 1505   CREATININE 0.56 (L) 02/11/2017 0846   CALCIUM 9.2 07/23/2022 0000   GFRNONAA 89 08/05/2019 1536   GFRNONAA 91 02/11/2017 0846   GFRAA 103 08/05/2019 1536   GFRAA 105 02/11/2017 0846   Lab Results  Component Value Date   HGBA1C 5.4 07/23/2022   HGBA1C 5.5 08/27/2015   Lab Results  Component Value Date   INSULIN 6.9 08/22/2020   Lab Results  Component Value Date   TSH 0.01 (A) 07/23/2022   CBC    Component Value Date/Time   WBC 4.9 07/23/2022 0000   WBC 6.4 02/11/2017 0846   RBC 4.9 07/23/2022 0000   HGB 14.8 07/23/2022 0000   HGB 14.9 08/22/2020 1505   HCT 45 07/23/2022 0000   HCT 45.4 08/22/2020 1505   PLT 194 07/23/2022 0000   PLT 210 08/27/2015 0938   MCV 87 08/22/2020 1505   MCH 28.7 08/22/2020 1505   MCH 28.5 02/11/2017 0846   MCHC 32.8  08/22/2020 1505   MCHC 32.9 02/11/2017 0846   RDW 13.0 08/22/2020 1505   Iron Studies No results found for: "IRON", "TIBC", "FERRITIN", "IRONPCTSAT" Lipid Panel     Component Value Date/Time   CHOL 170 07/23/2022 0000   CHOL 158 08/27/2015 0938   TRIG 83 07/23/2022 0000   HDL 52 07/23/2022 0000   HDL 48 08/27/2015 0938   CHOLHDL 3.9 02/11/2017 0846   LDLCALC 102 07/23/2022 0000   LDLCALC 94 02/11/2017 0846   Hepatic Function Panel     Component Value Date/Time   PROT 7.4 08/22/2020 1505   ALBUMIN 4.1 07/23/2022 0000   ALBUMIN 4.8 (H) 08/22/2020 1505   AST 18  07/23/2022 0000   ALT 19 07/23/2022 0000   ALKPHOS 72 07/23/2022 0000   BILITOT 0.4 08/22/2020 1505   BILIDIR 0.2 01/09/2021 0000      Component Value Date/Time   TSH 0.01 (A) 07/23/2022 0000   TSH <0.005 (L) 08/22/2020 1505   Nutritional Lab Results  Component Value Date   VD25OH 33.9 01/20/2022   VD25OH 39.9 01/09/2021   VD25OH 30.4 08/22/2020     Assessment and Plan    Obesity Patient has lost 2 pounds in the last 6 weeks and is adhering to the category two plan approximately 60% of the time. She is exercising regularly, walking and working out in the pool for 60-90 minutes, 5-6 times per week. Discussed the importance of consistent protein intake and provided guidance on meal planning. -Continue current exercise regimen. -Continue following the category two plan, aiming for increased adherence. -Consider high protein Wheaties cereal as an alternative breakfast option. -Return in one month for follow-up and weight check.  Vitamin D Deficiency Patient is currently on prescription vitamin D 50,000 international units weekly and reports no side effects. -Continue Vitamin D 50,000 international units weekly.        She was informed of the importance of frequent follow up visits to maximize her success with intensive lifestyle modifications for her multiple health conditions.    Quillian Quince, MD

## 2022-12-26 ENCOUNTER — Other Ambulatory Visit: Payer: Self-pay | Admitting: Sports Medicine

## 2022-12-26 DIAGNOSIS — M542 Cervicalgia: Secondary | ICD-10-CM

## 2022-12-26 DIAGNOSIS — M5030 Other cervical disc degeneration, unspecified cervical region: Secondary | ICD-10-CM

## 2022-12-28 ENCOUNTER — Other Ambulatory Visit: Payer: Self-pay | Admitting: Sports Medicine

## 2022-12-28 DIAGNOSIS — M542 Cervicalgia: Secondary | ICD-10-CM

## 2022-12-28 DIAGNOSIS — M5030 Other cervical disc degeneration, unspecified cervical region: Secondary | ICD-10-CM

## 2023-01-01 ENCOUNTER — Ambulatory Visit (INDEPENDENT_AMBULATORY_CARE_PROVIDER_SITE_OTHER): Payer: Medicare HMO | Admitting: Family Medicine

## 2023-01-03 ENCOUNTER — Other Ambulatory Visit: Payer: Self-pay | Admitting: Sports Medicine

## 2023-01-04 ENCOUNTER — Encounter: Payer: Self-pay | Admitting: Sports Medicine

## 2023-01-05 ENCOUNTER — Other Ambulatory Visit: Payer: Self-pay | Admitting: *Deleted

## 2023-01-05 MED ORDER — TRAMADOL HCL 50 MG PO TABS: 50.0000 mg | ORAL_TABLET | Freq: Three times a day (TID) | ORAL | 4 refills | Status: DC

## 2023-01-29 ENCOUNTER — Ambulatory Visit: Payer: Medicare HMO | Admitting: Sports Medicine

## 2023-02-05 ENCOUNTER — Encounter (INDEPENDENT_AMBULATORY_CARE_PROVIDER_SITE_OTHER): Payer: Self-pay | Admitting: Family Medicine

## 2023-02-05 ENCOUNTER — Ambulatory Visit (INDEPENDENT_AMBULATORY_CARE_PROVIDER_SITE_OTHER): Payer: Medicare HMO | Admitting: Family Medicine

## 2023-02-05 VITALS — BP 127/80 | HR 74 | Temp 98.3°F | Ht 66.0 in | Wt 181.0 lb

## 2023-02-05 DIAGNOSIS — E559 Vitamin D deficiency, unspecified: Secondary | ICD-10-CM

## 2023-02-05 DIAGNOSIS — Z6829 Body mass index (BMI) 29.0-29.9, adult: Secondary | ICD-10-CM

## 2023-02-05 DIAGNOSIS — E669 Obesity, unspecified: Secondary | ICD-10-CM

## 2023-02-05 DIAGNOSIS — I10 Essential (primary) hypertension: Secondary | ICD-10-CM

## 2023-02-05 MED ORDER — VITAMIN D (ERGOCALCIFEROL) 1.25 MG (50000 UNIT) PO CAPS
50000.0000 [IU] | ORAL_CAPSULE | ORAL | 0 refills | Status: AC
Start: 2023-02-05 — End: ?

## 2023-02-05 NOTE — Progress Notes (Signed)
.smr  Office: [phone]  /  Fax: [phone]  WEIGHT SUMMARY AND BIOMETRICS  Anthropometric Measurements Height: 5\' 6"  (1.676 m) Weight at Last Visit: 181 lb Starting Weight: 188 lb   No data recorded Other Clinical Data Fasting: No Labs: No Today's Visit #: 31 Starting Date: 08/22/20    Chief Complaint: OBESITY   History of Present Illness   The patient, with a history of hypertension, vitamin D deficiency, and obesity, has been well controlled on hydrochlorothiazide for blood pressure. She has been actively working on diet and exercise to improve her blood pressure with the ultimate goal of decreasing or stopping some of her medications. She is also on prescription vitamin D for her deficiency and has requested a refill.  Over the last two months, the patient has maintained her weight and has been following her category two eating plan fifty percent of the time. She continues to exercise at the pool for one to two hours per day and has been walking as well.  The patient has been experiencing some challenges with her diet, noting that she has been eating a lot of food, including four slices of bread a day. Despite this, she has managed to maintain her weight and even improve her body composition, with a decrease in body fat and an increase in muscle mass.  The patient has not reported any changes in her medication regimen. She has an upcoming trip to [place] for the holidays and has expressed some concern about being away from home. She has also expressed satisfaction with her current exercise regimen, which includes swimming and walking in the pool.          PHYSICAL EXAM:  Blood pressure 127/80, pulse 74, temperature 98.3 F (36.8 C), height 5\' 6"  (1.676 m), last menstrual period 02/25/1995, SpO2 93%. Body mass index is 29.21 kg/m.  DIAGNOSTIC DATA REVIEWED:  BMET    Component Value Date/Time   NA 142 07/23/2022 0000   K 4.2 07/23/2022 0000   CL 103 07/23/2022  0000   CO2 26 (A) 07/23/2022 0000   GLUCOSE 84 08/22/2020 1505   GLUCOSE 104 (H) 02/11/2017 0846   BUN 19 07/23/2022 0000   CREATININE 0.6 07/23/2022 0000   CREATININE 0.55 (L) 08/22/2020 1505   CREATININE 0.56 (L) 02/11/2017 0846   CALCIUM 9.2 07/23/2022 0000   GFRNONAA 89 08/05/2019 1536   GFRNONAA 91 02/11/2017 0846   GFRAA 103 08/05/2019 1536   GFRAA 105 02/11/2017 0846   Lab Results  Component Value Date   HGBA1C 5.4 07/23/2022   HGBA1C 5.5 08/27/2015   Lab Results  Component Value Date   INSULIN 6.9 08/22/2020   Lab Results  Component Value Date   TSH 0.01 (A) 07/23/2022   CBC    Component Value Date/Time   WBC 4.9 07/23/2022 0000   WBC 6.4 02/11/2017 0846   RBC 4.9 07/23/2022 0000   HGB 14.8 07/23/2022 0000   HGB 14.9 08/22/2020 1505   HCT 45 07/23/2022 0000   HCT 45.4 08/22/2020 1505   PLT 194 07/23/2022 0000   PLT 210 08/27/2015 0938   MCV 87 08/22/2020 1505   MCH 28.7 08/22/2020 1505   MCH 28.5 02/11/2017 0846   MCHC 32.8 08/22/2020 1505   MCHC 32.9 02/11/2017 0846   RDW 13.0 08/22/2020 1505   Iron Studies No results found for: "IRON", "TIBC", "FERRITIN", "IRONPCTSAT" Lipid Panel     Component Value Date/Time   CHOL 170 07/23/2022 0000   CHOL 158 08/27/2015  1610   TRIG 83 07/23/2022 0000   HDL 52 07/23/2022 0000   HDL 48 08/27/2015 0938   CHOLHDL 3.9 02/11/2017 0846   LDLCALC 102 07/23/2022 0000   LDLCALC 94 02/11/2017 0846   Hepatic Function Panel     Component Value Date/Time   PROT 7.4 08/22/2020 1505   ALBUMIN 4.1 07/23/2022 0000   ALBUMIN 4.8 (H) 08/22/2020 1505   AST 18 07/23/2022 0000   ALT 19 07/23/2022 0000   ALKPHOS 72 07/23/2022 0000   BILITOT 0.4 08/22/2020 1505   BILIDIR 0.2 01/09/2021 0000      Component Value Date/Time   TSH 0.01 (A) 07/23/2022 0000   TSH <0.005 (L) 08/22/2020 1505   Nutritional Lab Results  Component Value Date   VD25OH 33.9 01/20/2022   VD25OH 39.9 01/09/2021   VD25OH 30.4 08/22/2020      Assessment and Plan    Hypertension Hypertension is well controlled with hydrochlorothiazide. She is working on diet and exercise to potentially reduce or discontinue medication. Discussed benefits of maintaining a healthy lifestyle to reduce medication dependency. - Continue hydrochlorothiazide - Continue diet and exercise regimen  Obesity Weight has been maintained over the last two months. She follows a category two eating plan 50% of the time and engages in regular exercise, including pool activities and walking. Discussed benefits of continued adherence to the eating plan and exercise regimen for weight management and overall health. - Continue category two eating plan - Continue regular exercise regimen  Vitamin D Deficiency She is on prescription vitamin D and requests a refill. Plan to check vitamin D levels to assess current status. She prefers labs drawn at [facility] due to previous negative experiences at other facilities. - Refill prescription vitamin D - Order vitamin D level at [facility]  Follow-up - Follow up on January 9th at 2:40 PM - Schedule follow-up appointment for February.          She was informed of the importance of frequent follow up visits to maximize her success with intensive lifestyle modifications for her multiple health conditions.    Quillian Quince, MD

## 2023-02-10 NOTE — Addendum Note (Signed)
Addended by: Sande Brothers on: 02/10/2023 03:45 PM   Modules accepted: Orders

## 2023-02-11 LAB — VITAMIN D 25 HYDROXY (VIT D DEFICIENCY, FRACTURES): Vit D, 25-Hydroxy: 44 ng/mL (ref 30.0–100.0)

## 2023-03-05 ENCOUNTER — Ambulatory Visit (INDEPENDENT_AMBULATORY_CARE_PROVIDER_SITE_OTHER): Payer: Medicare HMO | Admitting: Family Medicine

## 2023-04-02 ENCOUNTER — Ambulatory Visit (INDEPENDENT_AMBULATORY_CARE_PROVIDER_SITE_OTHER): Payer: Medicare HMO | Admitting: Family Medicine

## 2023-04-02 ENCOUNTER — Encounter (INDEPENDENT_AMBULATORY_CARE_PROVIDER_SITE_OTHER): Payer: Self-pay | Admitting: Family Medicine

## 2023-04-02 VITALS — BP 117/76 | HR 73 | Temp 98.3°F | Ht 66.0 in | Wt 187.0 lb

## 2023-04-02 DIAGNOSIS — E559 Vitamin D deficiency, unspecified: Secondary | ICD-10-CM | POA: Diagnosis not present

## 2023-04-02 DIAGNOSIS — E66811 Obesity, class 1: Secondary | ICD-10-CM

## 2023-04-02 DIAGNOSIS — E669 Obesity, unspecified: Secondary | ICD-10-CM | POA: Diagnosis not present

## 2023-04-02 DIAGNOSIS — I10 Essential (primary) hypertension: Secondary | ICD-10-CM | POA: Diagnosis not present

## 2023-04-02 DIAGNOSIS — R7303 Prediabetes: Secondary | ICD-10-CM | POA: Diagnosis not present

## 2023-04-02 DIAGNOSIS — Z6830 Body mass index (BMI) 30.0-30.9, adult: Secondary | ICD-10-CM

## 2023-04-02 NOTE — Progress Notes (Signed)
 [name], D.O.  ABFM, ABOM Specializing in Clinical Bariatric Medicine  Office located at: [address] [place], [place]  [address]   Assessment and Plan:   FOR THE DISEASE OF OBESITY: Obesity, Beginning BMI 30.34 Assessment & Plan: Since last office visit on 02/05/23 patient's  Muscle mass has increased by 2.6 lb. Fat mass has increased by 2.8 lb. Total body water  has increased by 3 lb.  Counseling done on how various foods will affect these numbers and how to maximize success  Total lbs lost to date: 1 lb  Total weight loss percentage to date: 0.53%    Recommended Dietary Goals [name] is currently in the action stage of change. As such, her goal is to continue weight management plan.  She has agreed to: start keeping a food journal with a target of  1150-1200 calories per day and 80+ grams protein   Behavioral Intervention We discussed the following today: decreasing simple carbohydrates,  work on tracking and journaling calories using tracking application, and making healthier choices when eating out.   Additional resources provided today: Handout on adaptive thermogenesis, handout on mindful eating habits.   Evidence-based interventions for health behavior change were utilized today including the discussion of self monitoring techniques, problem-solving barriers and SMART goal setting techniques.   Regarding patient's less desirable eating habits and patterns, we employed the technique of small changes.   Pt will specifically work on: n/a   Recommended Physical Activity Goals [name] has been advised to work up to 150 minutes of moderate intensity aerobic activity a week and strengthening exercises 2-3 times per week for cardiovascular health, weight loss maintenance and preservation of muscle mass.   She has agreed to : continue pool activities and start walking on flat, forgiving surfaces to improve balance and bone strength.    Pharmacotherapy We both  agreed to : continue with nutritional and behavioral strategies   FOR ASSOCIATED CONDITIONS ADDRESSED TODAY:   Pre-diabetes Assessment & Plan: No current meds. Diet/exercise approach. Pt endorses feeling hungry at night. Upon food recall this appears from being under in her lean protein intake. Educated patient that having adequate amounts of protein with each meal is important for increasing muscle mass, stabilizing sugars, controlling hunger and cravings, and improving thermogenesis. Start journaling.    Hypertension, unspecified type Assessment & Plan: Last 3 blood pressure readings in our office are as follows: BP Readings from Last 3 Encounters:  04/02/23 117/76  02/05/23 127/80  12/04/22 125/75   HTN treated with Microzide 12.5 mg daily. BP is stable - no concerns. Continue antihypertensive and low sodium diet, advance exercise as tolerated.    Vitamin D  deficiency Assessment & Plan: Pt reports good compliance and tolerance of ERGO 50K units weekly. Maintain with current regimen. Recheck-future.    Follow up:   Return 04/16/23 with Dr.Beasley. She was informed of the importance of frequent follow up visits to maximize her success with intensive lifestyle modifications for her multiple health conditions.  Subjective:   Chief complaint: Obesity [name] is here to discuss her progress with her obesity treatment plan. She is on the Category 2 Plan and states she is following her eating plan approximately 0% of the time. She states she is walking 10,000 steps in the pool 90-120 minutes 5 days per week.  Interval History:  [name] is here for a follow up office visit. This is my first encounter with [name] - she typically sees my colleague Dr.Beasley. Since last  OV on 02/05/2023, [name] is up 6 lbs.  Pt endorses going out to eat frequently. Food recall from today- breakfast: 2 eggs, 2 slices of Navie Lamoreaux bread. Mid morning snack: apple. Lunch: chocolate shake with unknown amount of  calories and grams of protein. She feels hungry at night. She avoids liquid calories and candies. Drinks 6-7 large bottles of water  daily.   Pharmacotherapy for weight loss: She is currently taking no anti-obesity medication.   Review of Systems:  Pertinent positives were addressed with patient today.  Reviewed by clinician on day of visit: allergies, medications, problem list, medical history, surgical history, family history, social history, and previous encounter notes.  Weight Summary and Biometrics   Weight Lost Since Last Visit: 0  Weight Gained Since Last Visit: 6lb   Vitals Temp: 98.3 F (36.8 C) BP: 117/76 Pulse Rate: 73 SpO2: 90 %   Anthropometric Measurements Height: 5' 6 (1.676 m) Weight: 187 lb (84.8 kg) BMI (Calculated): 30.2 Weight at Last Visit: 181lb Weight Lost Since Last Visit: 0 Weight Gained Since Last Visit: 6lb Starting Weight: 188lb Total Weight Loss (lbs): 1 lb (0.454 kg)   Body Composition  Body Fat %: 42.3 % Fat Mass (lbs): 79.2 lbs Muscle Mass (lbs): 102.6 lbs Total Body Water  (lbs): 71 lbs Visceral Fat Rating : 13   Other Clinical Data Fasting: no Labs: no Today's Visit #: 32 Starting Date: 08/22/20   Objective:   PHYSICAL EXAM: Blood pressure 117/76, pulse 73, temperature 98.3 F (36.8 C), height 5' 6 (1.676 m), weight 187 lb (84.8 kg), last menstrual period 02/25/1995, SpO2 90%. Body mass index is 30.18 kg/m.  General: she is overweight, cooperative and in no acute distress. PSYCH: Has normal mood, affect and thought process.   HEENT: EOMI, sclerae are anicteric. Lungs: Normal breathing effort, no conversational dyspnea. Extremities: Moves * 4 Neurologic: A and O * 3, good insight  DIAGNOSTIC DATA REVIEWED: BMET    Component Value Date/Time   NA 142 07/23/2022 0000   K 4.2 07/23/2022 0000   CL 103 07/23/2022 0000   CO2 26 (A) 07/23/2022 0000   GLUCOSE 84 08/22/2020 1505   GLUCOSE 104 (H) 02/11/2017 0846   BUN  19 07/23/2022 0000   CREATININE 0.6 07/23/2022 0000   CREATININE 0.55 (L) 08/22/2020 1505   CREATININE 0.56 (L) 02/11/2017 0846   CALCIUM 9.2 07/23/2022 0000   GFRNONAA 89 08/05/2019 1536   GFRNONAA 91 02/11/2017 0846   GFRAA 103 08/05/2019 1536   GFRAA 105 02/11/2017 0846   Lab Results  Component Value Date   HGBA1C 5.4 07/23/2022   HGBA1C 5.5 08/27/2015   Lab Results  Component Value Date   INSULIN  6.9 08/22/2020   Lab Results  Component Value Date   TSH 0.01 (A) 07/23/2022   CBC    Component Value Date/Time   WBC 4.9 07/23/2022 0000   WBC 6.4 02/11/2017 0846   RBC 4.9 07/23/2022 0000   HGB 14.8 07/23/2022 0000   HGB 14.9 08/22/2020 1505   HCT 45 07/23/2022 0000   HCT 45.4 08/22/2020 1505   PLT 194 07/23/2022 0000   PLT 210 08/27/2015 0938   MCV 87 08/22/2020 1505   MCH 28.7 08/22/2020 1505   MCH 28.5 02/11/2017 0846   MCHC 32.8 08/22/2020 1505   MCHC 32.9 02/11/2017 0846   RDW 13.0 08/22/2020 1505   Iron Studies No results found for: IRON, TIBC, FERRITIN, IRONPCTSAT Lipid Panel     Component Value Date/Time   CHOL  170 07/23/2022 0000   CHOL 158 08/27/2015 0938   TRIG 83 07/23/2022 0000   HDL 52 07/23/2022 0000   HDL 48 08/27/2015 0938   CHOLHDL 3.9 02/11/2017 0846   LDLCALC 102 07/23/2022 0000   LDLCALC 94 02/11/2017 0846   Hepatic Function Panel     Component Value Date/Time   PROT 7.4 08/22/2020 1505   ALBUMIN 4.1 07/23/2022 0000   ALBUMIN 4.8 (H) 08/22/2020 1505   AST 18 07/23/2022 0000   ALT 19 07/23/2022 0000   ALKPHOS 72 07/23/2022 0000   BILITOT 0.4 08/22/2020 1505   BILIDIR 0.2 01/09/2021 0000      Component Value Date/Time   TSH 0.01 (A) 07/23/2022 0000   TSH <0.005 (L) 08/22/2020 1505   Nutritional Lab Results  Component Value Date   VD25OH 44.0 02/10/2023   VD25OH 33.9 01/20/2022   VD25OH 39.9 01/09/2021    Attestations:   I, Special Puri, acting as a stage manager for [employer], DO., have compiled all  relevant documentation for today's office visit on behalf of Brenda Jenkins, DO, while in the presence of [employer], DO.  I have reviewed the above documentation for accuracy and completeness, and I agree with the above. [name], D.O.  The 21st Century Cures Act was signed into law in 2016 which includes the topic of electronic health records.  This provides immediate access to information in MyChart.  This includes consultation notes, operative notes, office notes, lab results and pathology reports.  If you have any questions about what you read please let us  know at your next visit so we can discuss your concerns and take corrective action if need be.  We are right here with you.

## 2023-04-07 ENCOUNTER — Ambulatory Visit: Payer: Medicare HMO | Admitting: Sports Medicine

## 2023-04-16 ENCOUNTER — Ambulatory Visit (INDEPENDENT_AMBULATORY_CARE_PROVIDER_SITE_OTHER): Payer: Medicare HMO | Admitting: Sports Medicine

## 2023-04-16 ENCOUNTER — Ambulatory Visit (INDEPENDENT_AMBULATORY_CARE_PROVIDER_SITE_OTHER): Payer: Medicare HMO | Admitting: Family Medicine

## 2023-04-16 VITALS — BP 108/60 | Ht 66.0 in | Wt 188.0 lb

## 2023-04-16 DIAGNOSIS — M5030 Other cervical disc degeneration, unspecified cervical region: Secondary | ICD-10-CM

## 2023-04-16 NOTE — Progress Notes (Signed)
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  She would average 4 to 6/month.  Since we started treating her degenerative disc disease in her neck with tramadol and doxepin she may have 1 migraine per month and some months does not have any.  He is on other medicines for anxiety from her psychiatrist but does not seem to be having significant side effects from her combination of medicines  She has started an exercise program particular doing water exercises and is trying to get back in walking And lost about 16 pounds but has regained that and wants to lose the weight again  Physical examination Pleasant older female in no acute distress BP 108/60   Ht 5\' 6"  (1.676 m)   Wt 188 lb (85.3 kg)   LMP 02/25/1995 (Approximate)   BMI 30.34 kg/m   Neck range of motion is limited but does not create pain today He actually has a slight improvement in lateral rotation and bending There is less trapezius spasm noted Neurologic testing C5-T1 is normal

## 2023-04-16 NOTE — Assessment & Plan Note (Signed)
She continues to have an excellent response to treatment with tramadol and doxepin No changes in her medication schedule today  I did encourage her in continuing her fitness activities I agree that she should start doing some regular walking in addition to the pool exercises I suggested that she try a 12-hour fast from 8 PM to 8 AM daily along with her other dietary interventions to see if that helps her lose weight

## 2023-04-30 ENCOUNTER — Encounter (INDEPENDENT_AMBULATORY_CARE_PROVIDER_SITE_OTHER): Payer: Self-pay

## 2023-05-07 ENCOUNTER — Ambulatory Visit (INDEPENDENT_AMBULATORY_CARE_PROVIDER_SITE_OTHER): Payer: Medicare HMO | Admitting: Family Medicine

## 2023-05-07 NOTE — Progress Notes (Signed)
 Marland Kitchen

## 2023-05-11 ENCOUNTER — Encounter: Payer: Self-pay | Admitting: Neurology

## 2023-05-11 ENCOUNTER — Ambulatory Visit (INDEPENDENT_AMBULATORY_CARE_PROVIDER_SITE_OTHER): Payer: Medicare HMO | Admitting: Neurology

## 2023-05-11 VITALS — BP 108/63 | HR 80 | Ht 66.0 in

## 2023-05-11 DIAGNOSIS — M542 Cervicalgia: Secondary | ICD-10-CM

## 2023-05-11 DIAGNOSIS — G4733 Obstructive sleep apnea (adult) (pediatric): Secondary | ICD-10-CM | POA: Diagnosis not present

## 2023-05-11 DIAGNOSIS — R0683 Snoring: Secondary | ICD-10-CM | POA: Diagnosis not present

## 2023-05-11 DIAGNOSIS — G44221 Chronic tension-type headache, intractable: Secondary | ICD-10-CM

## 2023-05-11 DIAGNOSIS — G478 Other sleep disorders: Secondary | ICD-10-CM | POA: Diagnosis not present

## 2023-05-11 NOTE — Patient Instructions (Signed)
 Memory Compensation Strategies  Use "WARM" strategy.  W= write it down  A= associate it  R= repeat it  M= make a mental note  2.   You can keep a Glass blower/designer.  Use a 3-ring notebook with sections for the following: calendar, important names and phone numbers,  medications, doctors' names/phone numbers, lists/reminders, and a section to journal what you did  each day.   3.    Use a calendar to write appointments down.  4.    Write yourself a schedule for the day.  This can be placed on the calendar or in a separate section of the Memory Notebook.  Keeping a  regular schedule can help memory.  5.    Use medication organizer with sections for each day or morning/evening pills.  You may need help loading it  6.    Keep a basket, or pegboard by the door.  Place items that you need to take out with you in the basket or on the pegboard.  You may also want to  include a message board for reminders.  7.    Use sticky notes.  Place sticky notes with reminders in a place where the task is performed.  For example: " turn off the  stove" placed by the stove, "lock the door" placed on the door at eye level, " take your medications" on  the bathroom mirror or by the place where you normally take your medications.  8.    Use alarms/timers.  Use while cooking to remind yourself to check on food or as a reminder to take your medicine, or as a  reminder to make a call, or as a reminder to perform another task, etc. Problems With Thinking and Memory (Mild Neurocognitive Disorder): What to Know Mild neurocognitive disorder, formerly known as mild cognitive impairment, is a disorder where your memory doesn't work as well as it should. It may also affect other mental abilities like thinking, communicating, behavior, and being able to finish tasks. These problems can be noticed and measured. But they usually don't stop you from doing daily activities or living on your own. Mild neurocognitive disorder  usually happens after 83 years of age. But it can also happen at younger ages. It's not as serious as major neurocognitive disorder, also known as dementia, but it may be the first sign of it. In general, the symptoms of this condition get worse over time. In rare cases, symptoms can get better. What are the causes? This condition may be caused by: Brain disorders like Alzheimer's disease, Parkinson's disease, and other conditions that slowly damage nerve cells. Diseases that affect the blood vessels in the brain and cause small strokes. Certain infections, like HIV. Traumatic brain injury. Other medical conditions, such as brain tumors, underactive thyroid (hypothyroidism), and not having enough vitamin B12. Using certain drugs or medicines. What increases the risk? Being older than 83 years of age. Being female. Having a lower level of education. Diabetes, high blood pressure, high cholesterol, and other conditions that raise the risk for blood vessel diseases. Untreated or undertreated sleep apnea. Having a certain type of gene that can be inherited, or passed down from parent to child. Long-term health problems like heart disease, lung disease, liver disease, kidney disease, or depression. What are the signs or symptoms? Trouble remembering things. You may: Forget names, phone numbers, or details of recent events. Forget about social events and appointments. Often forget where you put your car keys or other items.  Trouble thinking and solving problems. You may have trouble with complex tasks like: Paying bills. Driving in places you don't know well. Trouble communicating. You may have trouble: Finding the right word or naming an object. Forming a sentence that makes sense. Understanding what you read or hear. Changes in your behavior or personality. When this happens, you may: Lose interest in the things you used to enjoy. Avoid being around people. Get angry more easily than  usual. Act before thinking. How is this diagnosed? This condition is diagnosed based on: Your symptoms. Your health care provider may ask you and the people you spend time with, like family and friends, about your symptoms. Memory tests and other tests to check how your brain is working. Your provider may refer you to a provider called a neurologist or a mental health specialist. To try to find out the cause of your condition, your provider may: Get a detailed medical history. Ask about use of alcohol, drugs, and medicines. Do a physical exam. Order blood tests and brain imaging tests. How is this treated? Mild neurocognitive disorder that's caused by medicine use, drug use, infection, or another medical condition may get better when the cause is treated, or when medicines or drugs are stopped. If this disorder has another cause, it usually doesn't improve and may get worse. In these cases, the goal of treatment is to help you manage the symptoms. This may include: Medicines to help with memory and behavior symptoms. Talk therapy. This provides education, emotional support, memory aids, and other ways of making up for problems with mental tasks. Lifestyle changes. These may include: Getting regular exercise. Eating a healthy diet that includes omega-3 fatty acids. Doing things to challenge your thinking and memory skills. Spending more time being with and talking to other people. Using routines like having regular times for meals and going to bed. Follow these instructions at home: Eating and drinking  Drink more fluids as told. Eat a healthy diet that includes omega-3 fatty acids. These can be found in: Fish. Nuts. Leafy vegetables. Vegetable oils. If you drink alcohol: Limit how much you have to: 0-1 drink a day if you're female. 0-2 drinks a day if you're female. Know how much alcohol is in your drink. In the [place] one drink is one 12 oz bottle of beer (355 mL), one 5 oz glass of  wine (148 mL), or one 1 oz glass of hard liquor (44 mL). Lifestyle  Get regular exercise as told by your provider. Do not smoke, vape, or use nicotine or tobacco. Use healthy ways to manage stress. If you need help managing stress, ask your provider. Keep spending time with other people. Keep your mind active by doing activities you enjoy, like reading or playing games. Make sure you get good sleep at night. These tips can help: Try not to take naps during the day. Keep your bedroom dark and cool. Do not exercise in the few hours before you go to bed. Do not have foods or drinks with caffeine at night. General instructions Take medicines only as told. Your provider may tell you to avoid taking medicines that can affect thinking. These include some medicines for pain or sleeping. Work with your provider to find out: What things you need help with. What your safety needs are. Where to find more information [employer] on Aging: [email or web address] Contact a health care provider if: You have any new symptoms. Get help right away if: You have new confusion or your  confusion gets worse. You act in ways that put you or your family in danger. This information is not intended to replace advice given to you by your health care provider. Make sure you discuss any questions you have with your health care provider. Document Revised: 08/05/2022 Document Reviewed: 08/05/2022 Elsevier Patient Education  2024 [employer].

## 2023-05-11 NOTE — Progress Notes (Signed)
 Provider:  Melvyn Novas, MD  Primary Care Physician:  Adrian Prince, MD [address] [place] [address]     Referring Provider: Adrian Prince, Md [address] [place],  [place] [address]          Chief Complaint according to patient   Patient presents with:                HISTORY OF PRESENT ILLNESS:  [name] is a 83 y.o. 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  She has currently no  migraines- she is headache free for several months, and that on Doxepine - which can affect STM.     Chief concern according to patient : [name] is here today for a yearly revisit her fatigue severity score is actually showing a well-controlled fatigue at 34 out of 63 points she has no excessive daytime sleepiness, her Epworth score was endorsed at 2 out of 24 points and her depression score is 6 out of 15 points so there is some depression but it is not a major depressive episode at this time.  She continues to use her CPAP.  She continues to have some chronic pain and is prescribed tramadol by sports medicine but also uses meloxicam for acute pain      [name] is a 83 y.o. female patient who is here for revisit 05/05/2022 for new CPAP first visit.   Chief concern according to patient :  new CPAP, to be seen within 30-90 days of issuing the new machine . "Insurance requires we follow up in a visit within 31-90 days. If you got the machine 12/28 then we must see you in a follow up visit anywhere between 03/24/22-05/22/2022".   [name] is here to follow-up on her new CPAP machine but she states that she had temporarily  reverted back to using her previous model which is still functioning she feels it is more substantial and therefore wants to continue using the older CPAP machine until this breaks or is no longer update ago.  She received a ResMed AirFit 11 .Marland Kitchen  This established patient continued to use her  older CPAP machine for  about 3 weeks before the machine could be replaced. The initially delivered machine was broken. and with high compliance and good resolution of apnea. Just last Friday , 05-02-2022 - she got another new CPAP machine, and she uses a nasal pillow. MEDICARE machine.  I am looking here at her downloads and these must reflect the use of the previously on machine and not the new one.  Over the last 30 days she has been 100% compliant user again she has used the machine 6 hours 54 minutes on average.  The AutoSet machine has a setting between a minimum pressure of 5 and a maximum pressure of 12 cm water with 3 cm expiratory pressure relief.  The residual AHI is 1.6/h.  95th percentile pressure need is 8.7 cm water air leakage is mild 14 L a minute.  6 minutes each night have shown Cheyne-Stokes respiration but this is not of concern.  Her medications have not significantly changed I will go through the list later today to clean out what may have been discontinued she started on asked from myosin for sinus infection, she was briefly on prednisone and loratadine.   10-31-2021: RV for  [name] is a  83 y.o. 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  She had a COVID infection in January 2021 and has had headaches since that - doxepin h as finally helped  . New CPAP ordered.       Review of Systems: Out of a complete 14 system review, the patient complains of only the following symptoms, and all other reviewed systems are negative.:   Social History   Socioeconomic History   Marital status: Married    Spouse name: Peyton Najjar   Number of children: 0   Years of education: 14   Highest education level: Not on file  Occupational History   Occupation: Retired Financial controller  Tobacco Use   Smoking status: Never    Passive exposure: Yes   Smokeless tobacco: Never   Tobacco comments:    flight attendant on smoking plane   Vaping Use   Vaping  status: Never Used  Substance and Sexual Activity   Alcohol use: Not Currently    Comment: 1 glass - occas of vodka   Drug use: No   Sexual activity: Not Currently    Partners: Male    Birth control/protection: Post-menopausal  Other Topics Concern   Not on file  Social History Narrative   Patient is married Peyton Najjar).   Patient drinks 2-4 cups of coffee daily.   Patient is retired.   Patient has two years of college.   Patient is right-handed.               Social Drivers of Corporate investment banker Strain: Low Risk  (10/28/2017)   Overall Financial Resource Strain (CARDIA)    Difficulty of Paying Living Expenses: Not hard at all  Food Insecurity: No Food Insecurity (10/28/2017)   Hunger Vital Sign    Worried About Running Out of Food in the Last Year: Never true    Ran Out of Food in the Last Year: Never true  Transportation Needs: No Transportation Needs (10/28/2017)   PRAPARE - Administrator, Civil Service (Medical): No    Lack of Transportation (Non-Medical): No  Physical Activity: Sufficiently Active (10/28/2017)   Exercise Vital Sign    Days of Exercise per Week: 4 days    Minutes of Exercise per Session: 60 min  Stress: No Stress Concern Present (10/28/2017)   [employer] of Occupational Health - Occupational Stress Questionnaire    Feeling of Stress : Only a little  Social Connections: Moderately Integrated (10/28/2017)   Social Connection and Isolation Panel [NHANES]    Frequency of Communication with Friends and Family: More than three times a week    Frequency of Social Gatherings with Friends and Family: More than three times a week    Attends Religious Services: More than 4 times per year    Active Member of [employer] or Organizations: No    Attends Engineer, structural: Never    Marital Status: Married    Family History  Problem Relation Age of Onset   Cancer Mother    Thyroid disease Mother    Hypertension Mother    Breast cancer  Mother    Aneurysm Father    Migraines Father    High blood pressure Brother    Melanoma Brother    Kidney cancer Brother        Lesion on kidney   Arrhythmia Brother    Heart attack Paternal Grandmother    Heart attack Paternal Grandfather  Breast cancer Maternal Aunt    Breast cancer Maternal Aunt    Breast cancer Maternal Aunt    Breast cancer Maternal Aunt    Heart disease Other        Grandmother    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   Hyperlipidemia    Impaired hearing    left ear, wears hearing aids   Insomnia    circadian rhythm component   Insomnia    Migraine 11/1986   [place]illman   Nuclear sclerosis    OSA on CPAP 03/2005   uses cpap setting of 10   Osteoarthritis 2007   [place]   Pain last week   left under breast pain    Plantar fasciitis    Rheumatic fever    Scarlet fever as child   Sleep apnea    Swallowing difficulty    Thyroid disorder    Vitamin D deficiency    Vitreous degeneration of right eye     Past Surgical History:  Procedure Laterality Date   BREAST BIOPSY Left 6/00   [place]   BREAST EXCISIONAL BIOPSY Left 1998   DE QUERVAIN'S RELEASE Right 10/97   [place]   left breast biopsy     ROOT CANAL  02/11/2012   TONSILLECTOMY  age 30   TONSILLECTOMY AND ADENOIDECTOMY  1952   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   TOTAL KNEE ARTHROPLASTY Right 11/08/2012   Procedure: RIGHT TOTAL KNEE ARTHROPLASTY;  Surgeon: Loanne Drilling, MD;  Location: [facility];  Service: Orthopedics;  Laterality: Right;   TUBAL LIGATION       Current Outpatient Medications on File Prior to Visit  Medication Sig Dispense Refill   aspirin EC 81 MG tablet Take 81 mg by mouth daily.     azelastine (ASTELIN) 0.1 %  nasal spray Place into the nose.     doxepin (SINEQUAN) 100 MG capsule TAKE 1 CAPSULE BY MOUTH AT BEDTIME 90 capsule 1   doxepin (SINEQUAN) 50 MG capsule TAKE 1 CAPSULE (50 MG) TWICE DAILY. TAKE 2 (100 MG) CAPSULES AT NIGHT. 360 capsule 1   Eszopiclone 3 MG TABS Take 3 mg by mouth at bedtime. Take immediately before bedtime     fluticasone (FLONASE) 50 MCG/ACT nasal spray      hydrochlorothiazide (MICROZIDE) 12.5 MG capsule Take 12.5 mg by mouth daily.     levothyroxine (SYNTHROID, LEVOTHROID) 200 MCG tablet Take 200 mcg by mouth daily before breakfast.     LORazepam (ATIVAN) 1 MG tablet Take 1 tablet (1 mg total) by mouth 2 (two) times daily. 30 tablet 1   lubiprostone (AMITIZA) 24 MCG capsule Take 24 mcg by mouth 2 (two) times daily with a meal.     melatonin 3 MG TABS tablet Take 20 mg by mouth.     polyethylene glycol (MIRALAX / GLYCOLAX) packet Take 17 g by mouth daily.     traMADol (ULTRAM) 50 MG tablet Take 1 tablet (50 mg total) by mouth in the morning, at noon, and at bedtime. 90 tablet 4   UBRELVY 50 MG TABS Take by mouth as needed.     Vitamin D, Ergocalciferol, (DRISDOL) 1.25 MG (50000 UNIT)  CAPS capsule Take 1 capsule (50,000 Units total) by mouth every 7 (seven) days. 13 capsule 0   montelukast (SINGULAIR) 10 MG tablet TAKE 1 TABLET BY MOUTH EVERY DAY (Patient not taking: Reported on 05/11/2023) 90 tablet 3   omeprazole (PRILOSEC) 40 MG capsule Take 1 capsule by mouth daily.     No current facility-administered medications on file prior to visit.    Allergies  Allergen Reactions   Penicillins Anaphylaxis    Tongue swelling   Codeine Nausea Only   Oxycodone-Acetaminophen    Penicillamine    Provigil [Modafinil]    Seroquel [Quetiapine Fumarate]    Xyrem [Oxybate]     hallucination   Ciprofloxacin Rash   Monistat [Miconazole] Rash   Terazol [Terconazole] Rash     DIAGNOSTIC DATA (LABS, IMAGING, TESTING) - I reviewed patient records, labs, notes, testing and imaging  myself where available.  Lab Results  Component Value Date   WBC 4.9 07/23/2022   HGB 14.8 07/23/2022   HCT 45 07/23/2022   MCV 87 08/22/2020   PLT 194 07/23/2022      Component Value Date/Time   NA 142 07/23/2022 0000   K 4.2 07/23/2022 0000   CL 103 07/23/2022 0000   CO2 26 (A) 07/23/2022 0000   GLUCOSE 84 08/22/2020 1505   GLUCOSE 104 (H) 02/11/2017 0846   BUN 19 07/23/2022 0000   CREATININE 0.6 07/23/2022 0000   CREATININE 0.55 (L) 08/22/2020 1505   CREATININE 0.56 (L) 02/11/2017 0846   CALCIUM 9.2 07/23/2022 0000   PROT 7.4 08/22/2020 1505   ALBUMIN 4.1 07/23/2022 0000   ALBUMIN 4.8 (H) 08/22/2020 1505   AST 18 07/23/2022 0000   ALT 19 07/23/2022 0000   ALKPHOS 72 07/23/2022 0000   BILITOT 0.4 08/22/2020 1505   GFRNONAA 89 08/05/2019 1536   GFRNONAA 91 02/11/2017 0846   GFRAA 103 08/05/2019 1536   GFRAA 105 02/11/2017 0846   Lab Results  Component Value Date   CHOL 170 07/23/2022   HDL 52 07/23/2022   LDLCALC 102 07/23/2022   TRIG 83 07/23/2022   CHOLHDL 3.9 02/11/2017   Lab Results  Component Value Date   HGBA1C 5.4 07/23/2022   Lab Results  Component Value Date   VITAMINB12 1,380 07/23/2022   Lab Results  Component Value Date   TSH 0.01 (A) 07/23/2022    PHYSICAL EXAM:  Today's Vitals   05/11/23 1518  BP: 108/63  Pulse: 80  Height: 5\' 6"  (1.676 m)   Body mass index is 30.34 kg/m.   Wt Readings from Last 3 Encounters:  04/16/23 188 lb (85.3 kg)  04/02/23 187 lb (84.8 kg)  02/05/23 181 lb (82.1 kg)     Ht Readings from Last 3 Encounters:  05/11/23 5\' 6"  (1.676 m)  04/16/23 5\' 6"  (1.676 m)  04/02/23 5\' 6"  (1.676 m)      General: The patient is awake, alert and appears not in acute distress. The patient is well groomed. Head: Normocephalic, atraumatic. Cranial nerves: Pupils are equal and briskly reactive to light. Funduscopic exam without evidence of pallor or edema. Extraocular movements  in vertical and horizontal planes intact  and without nystagmus. Visual fields by finger perimetry are intact. Hearing corrected with hearing  aids.  Facial sensation intact to fine touch. Facial motor strength is symmetric and tongue and uvula move midline. Shoulder shrug was asymmetrical.   Neck and shoulders are stiff.- extremely tense. Tension headaches, neck pain-   Motor exam:  Normal tone, muscle  bulk and symmetric strength in all extremities. Sensory:  Fine touch, pinprick and vibration were tested in all extremities. Proprioception tested in the upper extremities was normal. Coordination: Rapid alternating movements in the fingers/hands was normal.  Finger-to-nose maneuver  normal without evidence of ataxia, dysmetria or tremor. Gait and station: Patient walks without assistive device . Deep tendon reflexes: in the  upper and lower extremities are symmetric and intact. I avoided the patella.   Babinski maneuver response is downgoing.    ASSESSMENT AND PLAN 83 y.o. year old female  here with:    1) OSA on CPAP she is compliant, she needs new liners.   2) migraines well controlled.   3) reported  STM impairment from doxepin ?  She sleeps better , has less neck pain. MMSE performed today 28/ 3 points.  Lost one when she could nt recall one of 3 words and one point with serial 7th.   4) Obesity without  pre -diabetes.  Could her OSA dx help with weight loss medications. OSA is too mild for this  pathway.   5) depression is present, but is controlled, she has stopped watching the new (!)    I plan to follow up either personally or through our NP within 6 months.   I would like to thank  Adrian Prince, Md [address] [place],  [place] [address] f, Dr Darrick Penna, Dr Donell Beers, or allowing me to meet with and to take care of this pleasant patient.  .  After spending a total time of  35  minutes face to face and additional time for physical and neurologic examination, review of laboratory studies,  personal review of imaging  studies, reports and results of other testing and review of referral information / records as far as provided in visit,   Electronically signed by: Melvyn Novas, MD 05/11/2023 3:55 PM  [facility] and [facility] Sleep Board certified by The [employer] of Sleep Medicine and Diplomate of the [employer] of Sleep Medicine. Board certified In Neurology through the ABPN, Fellow of the [employer] of Neurology.

## 2023-05-14 ENCOUNTER — Emergency Department (HOSPITAL_BASED_OUTPATIENT_CLINIC_OR_DEPARTMENT_OTHER)
Admission: EM | Admit: 2023-05-14 | Discharge: 2023-05-14 | Disposition: A | Discharge status: Home / Self Care | Attending: Emergency Medicine | Admitting: Emergency Medicine

## 2023-05-14 ENCOUNTER — Emergency Department (HOSPITAL_BASED_OUTPATIENT_CLINIC_OR_DEPARTMENT_OTHER)

## 2023-05-14 ENCOUNTER — Emergency Department (HOSPITAL_BASED_OUTPATIENT_CLINIC_OR_DEPARTMENT_OTHER): Admitting: Radiology

## 2023-05-14 ENCOUNTER — Other Ambulatory Visit: Payer: Self-pay

## 2023-05-14 ENCOUNTER — Encounter (HOSPITAL_BASED_OUTPATIENT_CLINIC_OR_DEPARTMENT_OTHER): Payer: Self-pay | Admitting: Emergency Medicine

## 2023-05-14 DIAGNOSIS — Z96653 Presence of artificial knee joint, bilateral: Secondary | ICD-10-CM | POA: Diagnosis not present

## 2023-05-14 DIAGNOSIS — W06XXXA Fall from bed, initial encounter: Secondary | ICD-10-CM | POA: Insufficient documentation

## 2023-05-14 DIAGNOSIS — M25561 Pain in right knee: Secondary | ICD-10-CM | POA: Insufficient documentation

## 2023-05-14 DIAGNOSIS — S3992XA Unspecified injury of lower back, initial encounter: Secondary | ICD-10-CM | POA: Diagnosis present

## 2023-05-14 DIAGNOSIS — Z7982 Long term (current) use of aspirin: Secondary | ICD-10-CM | POA: Diagnosis not present

## 2023-05-14 DIAGNOSIS — S32010A Wedge compression fracture of first lumbar vertebra, initial encounter for closed fracture: Secondary | ICD-10-CM | POA: Insufficient documentation

## 2023-05-14 MED ORDER — IBUPROFEN 800 MG PO TABS
800.0000 mg | ORAL_TABLET | Freq: Once | ORAL | Status: AC
  Administered 2023-05-14: 800 mg via ORAL
  Filled 2023-05-14: qty 1

## 2023-05-14 MED ORDER — TRAMADOL HCL 50 MG PO TABS
50.0000 mg | ORAL_TABLET | Freq: Once | ORAL | Status: AC
  Administered 2023-05-14: 50 mg via ORAL
  Filled 2023-05-14: qty 1

## 2023-05-14 MED ORDER — IBUPROFEN 600 MG PO TABS: 600.0000 mg | ORAL_TABLET | Freq: Four times a day (QID) | ORAL | 0 refills | Status: AC | PRN

## 2023-05-14 MED ORDER — TRAMADOL HCL 50 MG PO TABS: 50.0000 mg | ORAL_TABLET | Freq: Three times a day (TID) | ORAL | 0 refills | Status: DC | PRN

## 2023-05-14 NOTE — ED Triage Notes (Signed)
 Pt caox4 c/o R knee and mid lower back pain after 2 falls at home today. Pt denies hitting her head and denies LOC. PMH bilateral knee replacements.

## 2023-05-14 NOTE — Discharge Instructions (Addendum)
 You have a compression fracture of your L1 lumbar spine.  You will need follow-up with an orthopedic clinic or back specialist.  Please call to make an appointment with your current clinic [facility].     Your orthopedic clinic should review your CT scan report from the hospital today.  The radiologist questions a possible schwannoma of the spine.  They did recommend a follow-up MRI of the lumbar spine if your specialist feels it is indicated.  Your x-ray did not show broken bone on your knee.  You do have swelling and an effusion or fluid on the knee.  This is likely related to a bruise, sprain, or injury of the muscles.  I recommend that use a walker at all times for extra support.  Your leg may be more wobbly or less supportive than normal.  For pain you can take ibuprofen regularly as prescribed, with a small amount of food.  For breakthrough pain you can take your typical tramadol 50 mg.  If you are still having severe pain you can take an additional 50 mg of tramadol every 8 hours.  Do not exceed 100 mg of tramadol in a single dose.  Do not mix these narcotic medicines with alcohol or any other sedating medicines.

## 2023-05-14 NOTE — ED Provider Notes (Signed)
 [name] EMERGENCY DEPARTMENT AT [facility] Provider Note   CSN: [id number] Arrival date & time: 05/14/23  1531     History  Chief Complaint  Patient presents with   Marletta Lor    [name] is a 83 y.o. female with a history of bilateral knee replacements presented to ED with a fall today.  Patient reports that she fell getting out of bed and landed on her right knee.  The knee was very painful afterwards.  Later in the house she fell backwards and landed on her tailbone is also having low back pain.  She reports typically she is able to walk unassisted without balance problems.  She chronically suffers from pain and takes tramadol 3 times a day, also lorazepam multiple times daily.   She reports a history of knee replacement several years ago with Dr.Alucio.  She denies striking her head or loss of consciousness.  She denies ongoing dizziness.  She is not on blood thinners or anticoagulation.  She did take tramadol prior to coming in but is still having significant pain in her knee.  HPI     Home Medications Prior to Admission medications   Medication Sig Start Date End Date Taking? Authorizing Provider  ibuprofen (ADVIL) 600 MG tablet Take 1 tablet (600 mg total) by mouth every 6 (six) hours as needed for up to 30 doses for mild pain (pain score 1-3) or moderate pain (pain score 4-6). 05/14/23  Yes Ingri Diemer, Kermit Balo, MD  traMADol (ULTRAM) 50 MG tablet Take 1 tablet (50 mg total) by mouth every 8 (eight) hours as needed for up to 20 doses. 05/14/23  Yes Terald Sleeper, MD  aspirin EC 81 MG tablet Take 81 mg by mouth daily.    [provider]  azelastine (ASTELIN) 0.1 % nasal spray Place into the nose.    [provider]  doxepin (SINEQUAN) 100 MG capsule TAKE 1 CAPSULE BY MOUTH AT BEDTIME 01/08/23   Enid Baas, MD  doxepin (SINEQUAN) 50 MG capsule TAKE 1 CAPSULE (50 MG) TWICE DAILY. TAKE 2 (100 MG) CAPSULES AT NIGHT. 01/08/23   Enid Baas, MD   Eszopiclone 3 MG TABS Take 3 mg by mouth at bedtime. Take immediately before bedtime    [provider]  fluticasone (FLONASE) 50 MCG/ACT nasal spray  09/03/15   [provider]  hydrochlorothiazide (MICROZIDE) 12.5 MG capsule Take 12.5 mg by mouth daily. 01/20/22   [provider]  levothyroxine (SYNTHROID, LEVOTHROID) 200 MCG tablet Take 200 mcg by mouth daily before breakfast.    [provider]  LORazepam (ATIVAN) 1 MG tablet Take 1 tablet (1 mg total) by mouth 2 (two) times daily. 03/19/21   Dohmeier, Porfirio Mylar, MD  lubiprostone (AMITIZA) 24 MCG capsule Take 24 mcg by mouth 2 (two) times daily with a meal.    [provider]  melatonin 3 MG TABS tablet Take 20 mg by mouth.    [provider]  montelukast (SINGULAIR) 10 MG tablet TAKE 1 TABLET BY MOUTH EVERY DAY Patient not taking: Reported on 05/11/2023 06/11/21   Madelyn Brunner, DO  omeprazole (PRILOSEC) 40 MG capsule Take 1 capsule by mouth daily. 05/06/22 05/06/23  [provider]  polyethylene glycol (MIRALAX / GLYCOLAX) packet Take 17 g by mouth daily.    [provider]  traMADol (ULTRAM) 50 MG tablet Take 1 tablet (50 mg total) by mouth in the morning, at noon, and at bedtime. 01/05/23   Enid Baas, MD  UBRELVY 50 MG TABS Take by mouth as needed. 01/11/20   [provider]  Vitamin D, Ergocalciferol, (DRISDOL) 1.25 MG (50000 UNIT) CAPS capsule Take 1 capsule (50,000 Units total) by mouth every 7 (seven) days. 02/05/23   Quillian Quince D, MD      Allergies    Penicillins, Codeine, Oxycodone-acetaminophen, Penicillamine, Provigil [modafinil], Seroquel [quetiapine fumarate], Xyrem [oxybate], Ciprofloxacin, Monistat [miconazole], and Terazol [terconazole]    Review of Systems   Review of Systems  Physical Exam Updated Vital Signs BP 121/69   Pulse 83   Temp 98.2 F (36.8 C) (Oral)   Resp 18   Ht 5\' 6"  (1.676 m)   Wt 86.2 kg   LMP 02/25/1995 (Approximate)    SpO2 96%   BMI 30.67 kg/m  Physical Exam Constitutional:      General: She is not in acute distress. HENT:     Head: Normocephalic and atraumatic.  Eyes:     Conjunctiva/sclera: Conjunctivae normal.     Pupils: Pupils are equal, round, and reactive to light.  Cardiovascular:     Rate and Rhythm: Normal rate and regular rhythm.  Pulmonary:     Effort: Pulmonary effort is normal. No respiratory distress.  Abdominal:     General: There is no distension.     Tenderness: There is no abdominal tenderness.  Musculoskeletal:     Comments: I am able to passively perform full range of motion testing at the hips and knees, there is a small effusion of the right knee joint, small anterior hematoma, mild ecchymosis of the posterior knee without significant swelling The patient does have lumbar midline tenderness  Skin:    General: Skin is warm and dry.  Neurological:     General: No focal deficit present.     Mental Status: She is alert. Mental status is at baseline.  Psychiatric:        Mood and Affect: Mood normal.        Behavior: Behavior normal.     ED Results / Procedures / Treatments   Labs (all labs ordered are listed, but only abnormal results are displayed) Labs Reviewed - No data to display  EKG None  Radiology DG Knee Complete 4 Views Right Result Date: 05/14/2023 CLINICAL DATA:  Fall onto knee, total knee prosthesis. EXAM: RIGHT KNEE - COMPLETE 4+ VIEW COMPARISON:  None Available. FINDINGS: Total knee prosthesis in place including resurfaced patellar component. Small knee effusion in the suprapatellar bursa. No fracture or acute bony findings identified. No complicating feature related to the prosthesis is identified. IMPRESSION: 1. Total knee prosthesis in place. 2. Small knee effusion in the suprapatellar bursa. 3. No acute bony findings identified. Electronically Signed   By: Gaylyn Rong M.D.   On: 05/14/2023 17:07   CT Lumbar Spine Wo Contrast Result Date:  05/14/2023 CLINICAL DATA:  Low back pain, falls today EXAM: CT LUMBAR SPINE WITHOUT CONTRAST TECHNIQUE: Multidetector CT imaging of the lumbar spine was performed without intravenous contrast administration. Multiplanar CT image reconstructions were also generated. RADIATION DOSE REDUCTION: This exam was performed according to the departmental dose-optimization program which includes automated exposure control, adjustment of the mA and/or kV according to patient size and/or use of iterative reconstruction technique. COMPARISON:  None Available. FINDINGS: Segmentation: The lowest lumbar type non-rib-bearing vertebra is labeled as L5. Alignment: 3 mm degenerative anterolisthesis at L4-5. Vertebrae: Subtle 15% superior endplate compression fracture at L1 appears acute, with nondisplaced cortical discontinuity along the superior endplate and central depression of  the superior endplate. No definite middle column involvement or bony retropulsion. No posterior element involvement. Mildly prominent fluid density in the neural foramina at this level probably attributable to dilated nerve root sleeves, strictly speaking I cannot exclude schwannomas particularly on the right where there is a minimally scalloped undersurface of the right T12 pedicle. Paraspinal and other soft tissues: Fluid density a hepatic lesions favor cysts. Fluid density right posterior renal lesion compatible with cyst. No further imaging workup of these lesions is indicated. Mild atheromatous vascular calcification of the abdominal aorta. Disc levels: T12-L1: No impingement. As noted above there is low-density filling of the neural foramina, favor dilated nerve root sleeves over schwannomas, although there is a slightly scalloped undersurface of the right T12 pedicle. If this is a clinically significant distinction, MRI of the lumbar spine with and without contrast could be employed. L1-2: Unremarkable L2-3: Unremarkable L3-4: No impingement. Mild  degenerative facet arthropathy and mild disc bulge. L4-5: Suspected right greater than left subarticular lateral recess stenosis due to disc bulge and facet arthropathy. L5-S1: Suspected mild bilateral subarticular lateral recess stenosis due to facet arthropathy. IMPRESSION: 1. Subtle acute 15% superior endplate compression fracture at L1. No definite middle column involvement or bony retropulsion. No posterior element involvement. 2. Mildly prominent fluid density in the neural foramina at T12-L1, favor dilated nerve root sleeves over schwannomas, although there is a slightly scalloped undersurface of the right T12 pedicle. If this is a clinically significant distinction, MRI of the lumbar spine with and without contrast could be employed. 3. Lumbar spondylosis and degenerative disc disease, causing suspected right greater than left subarticular lateral recess stenosis at L4-5 and mild bilateral subarticular lateral recess stenosis at L5-S1. 4. 3 mm degenerative anterolisthesis at L4-5. 5.  Aortic Atherosclerosis (ICD10-I70.0). Electronically Signed   By: Gaylyn Rong M.D.   On: 05/14/2023 17:05    Procedures Procedures    Medications Ordered in ED Medications  ibuprofen (ADVIL) tablet 800 mg (800 mg Oral Given 05/14/23 1638)  traMADol (ULTRAM) tablet 50 mg (50 mg Oral Given 05/14/23 1638)  traMADol (ULTRAM) tablet 50 mg (50 mg Oral Given 05/14/23 1755)    ED Course/ Medical Decision Making/ A&P Clinical Course as of 05/14/23 1818  Thu May 14, 2023  1812 Patient's pain appears to be improved and she was able to ambulate steadily down the hallway with a walker.  She does have a walker at home.  I discussed the results of her workup with her including the L1 compression fracture and the questionable schwannoma, and she will follow-up with her current orthopedic clinic [facility] for all these issues.  They are comfortable going home.  I will prescribe additional tramadol as she has been taking this  chronically may need a higher dose for a few days at home for her breakthrough pain.  I also will prescribe ibuprofen for several days.  They are comfortable with this plan [MT]    Clinical Course User Index [MT] Glenn Christo, Kermit Balo, MD                                 Medical Decision Making Amount and/or Complexity of Data Reviewed Radiology: ordered.  Risk Prescription drug management.   Patient is here with a mechanical fall at home and isolated injuries of the right knee and the lower back.  I have ordered x-ray of the knee as well as CT of the lumbar spine.  I have ordered Motrin as well as additional tramadol for the patient's pain.  No other significant traumatic injuries noted on exam.  Supplemental history provided by her husband at the bedside.  Imaging personally viewed interpreted.  There is no acute fracture of the knee.  There is concern for an L1 fracture. Radiologist questioning possible schwannoma, discussed with the patient and her husband and they will follow-up at the orthopedic clinic and discuss MRI imaging as needed.  The patient is not wanting to wear a TLSO back brace due to balance difficulty and clumsiness.  I think this is reasonable to proceed without it at this point.  The brace is more likely to cause pain then relieve any or provide any significant stability to the small compression fracture.  She is using a walker here at the bedside and has 1 at home.  Her husband also lives with her to help her.  Her pain appears improved after her medications given in the ED.  She is stable for discharge        Final Clinical Impression(s) / ED Diagnoses Final diagnoses:  Closed compression fracture of body of L1 vertebra (HCC)  Acute pain of right knee    Rx / DC Orders ED Discharge Orders          Ordered    traMADol (ULTRAM) 50 MG tablet  Every 8 hours PRN        05/14/23 1816    ibuprofen (ADVIL) 600 MG tablet  Every 6 hours PRN        05/14/23 1816               Terald Sleeper, MD 05/14/23 1818

## 2023-05-21 ENCOUNTER — Ambulatory Visit (HOSPITAL_BASED_OUTPATIENT_CLINIC_OR_DEPARTMENT_OTHER)
Admission: RE | Admit: 2023-05-21 | Discharge: 2023-05-21 | Disposition: A | Discharge status: Home / Self Care | Source: Ambulatory Visit | Attending: Orthopedic Surgery | Admitting: Orthopedic Surgery

## 2023-05-21 ENCOUNTER — Other Ambulatory Visit (HOSPITAL_BASED_OUTPATIENT_CLINIC_OR_DEPARTMENT_OTHER): Payer: Self-pay | Admitting: Orthopedic Surgery

## 2023-05-21 DIAGNOSIS — M79604 Pain in right leg: Secondary | ICD-10-CM | POA: Diagnosis present

## 2023-05-22 ENCOUNTER — Encounter: Payer: Self-pay | Admitting: Sports Medicine

## 2023-05-28 ENCOUNTER — Ambulatory Visit (INDEPENDENT_AMBULATORY_CARE_PROVIDER_SITE_OTHER): Admitting: Sports Medicine

## 2023-05-28 VITALS — BP 133/64 | Ht 66.0 in | Wt 190.0 lb

## 2023-05-28 DIAGNOSIS — M25561 Pain in right knee: Secondary | ICD-10-CM | POA: Diagnosis not present

## 2023-05-28 DIAGNOSIS — S32010A Wedge compression fracture of first lumbar vertebra, initial encounter for closed fracture: Secondary | ICD-10-CM | POA: Insufficient documentation

## 2023-05-28 MED ORDER — TRAMADOL HCL 50 MG PO TABS: 100.0000 mg | ORAL_TABLET | Freq: Three times a day (TID) | ORAL | 1 refills | Status: DC

## 2023-05-28 NOTE — Progress Notes (Addendum)
 CC:  Leg swelling and lumbar fracture  Patient 2 weeks past 2 falls One she stepped out of bed and fell into a hard step Landing on RT knee which is S/P TKR Lot of pain and swelling  Husband got her walker out While they were going into house she threw off her coat Fell backwards and landed on buttocks Intense pain in low back  Seen at ED Found to have 15% compression fracture at L1 RT knee xray did not show displacement or fracture  Seen at [facility] after this and RT leg swollen and painful [place] for DVT screen did not show DVT but did show superficial saphenous vein thrombosis Left on 81 mg asa per day  Because of pain her tramadol increased to 100 mg TID Also taking ibuprofen 600 and getting some relief  However RT knee and lower leg are getting worse More tightly swollen and more painful  PE Pleasant older F in NAD but some pain  RT lower leg swollen and tight from knee to foot Discoloration on dorsum of RT foot Painful to palpation in proximal tibia and calf Painful with weight bearing  Low back tender to sit or to palpation   06/01/23 I reviewed CT result with probably hematoma of soft tissue.  I'm recommending rest. I called Dr Despina Hick who weill see her this week and he will advise whether any more aggressive therapy needed.  Sterling Big, MD

## 2023-05-28 NOTE — Assessment & Plan Note (Signed)
 I'm very concerned that something is making the RL Leg tense and swollen With neg DVT screen last week, I worry about an occult peri-prosthetic fracture  Stat CT of RT knee and tibia  If this proves negative we might need to repeat doppler for DVT

## 2023-05-28 NOTE — Assessment & Plan Note (Signed)
 See overview for today  Change treatment plan based on CT knee if needed

## 2023-05-28 NOTE — Patient Instructions (Addendum)
 Try to stay off your leg as much as you can until we get the CT results. Ok to get up to use the bathroom and move around a bit, but no excessive walking. Until we can get your swelling down you'll need to be under bed rest. Could be a week, 10 days or more. You'll likely need to use the walker for 6-8 weeks, we'll know for sure after the CT. We'll refill your tramadol to take 2 tablets, three times daily. We would like to get you back to 1 tablet 3 times daily once your pain and swelling come down significantly. Ibuprofen only if needed but try the tramadol and add tylenol.

## 2023-05-29 ENCOUNTER — Ambulatory Visit
Admission: RE | Admit: 2023-05-29 | Discharge: 2023-05-29 | Disposition: A | Discharge status: Home / Self Care | Source: Ambulatory Visit | Attending: Sports Medicine | Admitting: Sports Medicine

## 2023-05-29 ENCOUNTER — Encounter: Payer: Self-pay | Admitting: Sports Medicine

## 2023-05-29 DIAGNOSIS — M25561 Pain in right knee: Secondary | ICD-10-CM

## 2023-06-16 ENCOUNTER — Encounter: Payer: Self-pay | Admitting: Sports Medicine

## 2023-06-16 ENCOUNTER — Ambulatory Visit (INDEPENDENT_AMBULATORY_CARE_PROVIDER_SITE_OTHER): Admitting: Sports Medicine

## 2023-06-16 VITALS — BP 124/72 | Ht 66.0 in | Wt 190.0 lb

## 2023-06-16 DIAGNOSIS — M25561 Pain in right knee: Secondary | ICD-10-CM | POA: Diagnosis not present

## 2023-06-16 DIAGNOSIS — S32010A Wedge compression fracture of first lumbar vertebra, initial encounter for closed fracture: Secondary | ICD-10-CM | POA: Diagnosis not present

## 2023-06-16 DIAGNOSIS — M79674 Pain in right toe(s): Secondary | ICD-10-CM | POA: Diagnosis not present

## 2023-06-16 NOTE — Assessment & Plan Note (Addendum)
-   The patient is doing well now and her pain is not noticeable beyond her normal fibromyalgia pain. - She can start increasing her mobility with a walker and gentle range of motion movements in the standing position.  In the next 2 weeks she can DC the walker. - She can also start back with her pool exercises - She can decrease the tramadol  to twice daily if able - We will follow-up with her in 1 month

## 2023-06-16 NOTE — Assessment & Plan Note (Signed)
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  This has improved since the initial insult. - Discussed the use of topical Voltaren  gel or Arnica over the area for relief

## 2023-06-16 NOTE — Assessment & Plan Note (Signed)
-   The patient's hematoma has greatly improved.  There is much less tightness in the calf musculature although she continues to have some swelling which will likely absorb over the next several weeks. - We discussed the use of compression stockings with elevation to decrease the excess fluid.

## 2023-06-16 NOTE — Progress Notes (Addendum)
 [name] - 83 y.o. female MRN [id number]  Date of birth: 1940/08/05  PCP: Rosslyn Coons, MD  Subjective:  No chief complaint on file. L1 compression fracture  HPI: Past Medical, Surgical, Social, and Family History Reviewed & Updated per EMR.   Patient is a 83 y.o. female here for follow up on an L1 compression fracture and contusion with hematoma of the right knee, last seen on 05-28-2023. The patient has been fairly immobile during this period to help protect her back.  She has been using the walker to get around and is now able to stand up fairly straight without difficulty.  The swelling and tightness in her right knee and calf has markedly improved.  She has not been using compression stockings but has been elevating the extremity.  She did notice an area over her right great toe which has been painful since the fall.  This is not had any increased swelling, redness, or decreased range of motion.  She does note a history of fibromyalgia and states that her back pain is not noticeable above her normal pain.  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   Hyperlipidemia    Impaired hearing    left ear, wears hearing aids   Insomnia    circadian rhythm component   Insomnia    Migraine 11/1986   [place]   Nuclear sclerosis    OSA on CPAP 03/2005   uses cpap setting of 10   Osteoarthritis 2007   [place]   Pain last week   left under breast pain    Plantar fasciitis    Rheumatic fever    Scarlet fever as child   Sleep apnea    Swallowing difficulty    Thyroid  disorder    Vitamin D  deficiency    Vitreous degeneration of right eye     Current Outpatient Medications on File Prior to Visit  Medication Sig Dispense Refill   aspirin EC  81 MG tablet Take 81 mg by mouth daily.     azelastine (ASTELIN) 0.1 % nasal spray Place into the nose.     doxepin  (SINEQUAN ) 100 MG capsule TAKE 1 CAPSULE BY MOUTH AT BEDTIME 90 capsule 1   doxepin  (SINEQUAN ) 50 MG capsule TAKE 1 CAPSULE (50 MG) TWICE DAILY. TAKE 2 (100 MG) CAPSULES AT NIGHT. 360 capsule 1   Eszopiclone  3 MG TABS Take 3 mg by mouth at bedtime. Take immediately before bedtime     fluticasone (FLONASE) 50 MCG/ACT nasal spray      hydrochlorothiazide (MICROZIDE) 12.5 MG capsule Take 12.5 mg by mouth daily.     ibuprofen  (ADVIL ) 600 MG tablet Take 1 tablet (600 mg total) by mouth every 6 (six) hours as needed for up to 30 doses for mild pain (pain score 1-3) or moderate pain (pain score 4-6). 30 tablet 0   levothyroxine (SYNTHROID, LEVOTHROID) 200 MCG tablet Take 200 mcg by mouth daily before breakfast.     LORazepam  (ATIVAN ) 1 MG tablet Take 1 tablet (1 mg total) by mouth 2 (two) times daily. 30 tablet 1   lubiprostone (AMITIZA) 24 MCG capsule Take 24 mcg by mouth 2 (two) times daily with  a meal.     melatonin 3 MG TABS tablet Take 20 mg by mouth.     montelukast (SINGULAIR) 10 MG tablet TAKE 1 TABLET BY MOUTH EVERY DAY (Patient not taking: Reported on 05/11/2023) 90 tablet 3   omeprazole (PRILOSEC) 40 MG capsule Take 1 capsule by mouth daily.     polyethylene glycol (MIRALAX  / GLYCOLAX ) packet Take 17 g by mouth daily.     traMADol  (ULTRAM ) 50 MG tablet Take 2 tablets (100 mg total) by mouth in the morning, at noon, and at bedtime. 180 tablet 1   UBRELVY 50 MG TABS Take by mouth as needed.     Vitamin D , Ergocalciferol , (DRISDOL ) 1.25 MG (50000 UNIT) CAPS capsule Take 1 capsule (50,000 Units total) by mouth every 7 (seven) days. 13 capsule 0   No current facility-administered medications on file prior to visit.        Objective:  Physical Exam: VS: BP:124/72  HR: bpm  TEMP: ( )  RESP:   HT:5\' 6"  (167.6 cm)   WT:190 lb (86.2 kg)  BMI:30.68  Gen: NAD, speaks clearly,  comfortable in exam room Respiratory: Normal respiratory effort on room air. No signs of distress Skin: No rashes, abrasions, or ecchymosis MSK:  Back Exam:  Motion: Flexion 20 deg, Extension 10 deg, Side Bending to 10 deg bilaterally Palpable tenderness: None over the spinous processes or paraspinal musculature.  Some tenderness over the left proximal gluteus muscle Sensory change: Gross sensation intact to lumbar dermatomes.  Leg strength  Quad: 5/5 Hip flexor: 5/5   Gait slow and slightly antalgic using walker for stability.  Right knee: Inspection: No effusion or edema.  There is 1+ edema of the lower extremity from the mid shin down with some healing ecchymosis surrounding the lateral ankle. ROM: Knee and ankle range of motion is full Palpation w/ no warmth, no effusion present, no posterior tenderness no joint line tenderness to palpation   There is a small, healing abrasion over the medial aspect of the right great dorsal toe Range of motion is intact No tenderness to palpation over the joint    Assessment & Plan:   Closed compression fracture of body of L1 vertebra (HCC) - The patient is doing well now and her pain is not noticeable beyond her normal fibromyalgia pain. - She can start increasing her mobility with a walker and gentle range of motion movements in the standing position.  In the next 2 weeks she can DC the walker. - She can also start back with her pool exercises - She can decrease the tramadol  to twice daily if able - We will follow-up with her in 1 month  Acute pain of right knee - The patient's hematoma has greatly improved.  There is much less tightness in the calf musculature although she continues to have some swelling which will likely absorb over the next several weeks. - We discussed the use of compression stockings with elevation to decrease the excess fluid.  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  This  has improved since the initial insult. - Discussed the use of topical Voltaren  gel or Arnica over the area for relief    Berneda Bridges MD [facility] Sports Medicine Fellow  I observed and examined the patient with the [facility] resident and agree with assessment and plan.  Note reviewed and modified by me. Zell Hicks, MD

## 2023-07-01 ENCOUNTER — Other Ambulatory Visit: Payer: Self-pay | Admitting: Sports Medicine

## 2023-07-01 DIAGNOSIS — M5030 Other cervical disc degeneration, unspecified cervical region: Secondary | ICD-10-CM

## 2023-07-01 DIAGNOSIS — M542 Cervicalgia: Secondary | ICD-10-CM

## 2023-07-06 ENCOUNTER — Encounter: Payer: Self-pay | Admitting: Sports Medicine

## 2023-07-06 ENCOUNTER — Other Ambulatory Visit: Payer: Self-pay | Admitting: *Deleted

## 2023-07-06 DIAGNOSIS — M542 Cervicalgia: Secondary | ICD-10-CM

## 2023-07-06 DIAGNOSIS — M5030 Other cervical disc degeneration, unspecified cervical region: Secondary | ICD-10-CM

## 2023-07-06 MED ORDER — DOXEPIN HCL 50 MG PO CAPS
ORAL_CAPSULE | ORAL | 1 refills | Status: DC
Start: 2023-07-06 — End: 2024-01-07

## 2023-07-06 MED ORDER — DOXEPIN HCL 100 MG PO CAPS
100.0000 mg | ORAL_CAPSULE | Freq: Every day | ORAL | 1 refills | Status: AC
Start: 2023-07-06 — End: ?

## 2023-07-28 ENCOUNTER — Encounter: Payer: Self-pay | Admitting: Sports Medicine

## 2023-07-28 ENCOUNTER — Ambulatory Visit (INDEPENDENT_AMBULATORY_CARE_PROVIDER_SITE_OTHER): Admitting: Sports Medicine

## 2023-07-28 VITALS — BP 131/76 | Ht 66.0 in | Wt 190.0 lb

## 2023-07-28 DIAGNOSIS — S32010A Wedge compression fracture of first lumbar vertebra, initial encounter for closed fracture: Secondary | ICD-10-CM

## 2023-07-28 DIAGNOSIS — I878 Other specified disorders of veins: Secondary | ICD-10-CM | POA: Insufficient documentation

## 2023-07-28 DIAGNOSIS — M25561 Pain in right knee: Secondary | ICD-10-CM | POA: Diagnosis not present

## 2023-07-28 NOTE — Patient Instructions (Signed)
 Start applying compression socks to your lower extremities.  You can pick of the regular boot socks which may be more comfortable.  Make sure that they come up to your knee You can apply lidocaine  patches for 12 hours and then remove them for 12 hours over the lower back for some localized relief A referral for physical therapy has been sent and then they should contact you to set up appointments.  Start the home exercises for hip abduction strengthening.  You can incorporate these into the water  physical therapy. Decrease the tramadol  to twice a day, specifically in the morning or at night.   We will follow-up with you in 6 weeks

## 2023-07-28 NOTE — Assessment & Plan Note (Signed)
-   The patient's previous right lower extremity hematoma has resolved.  She has residual edema which is likely due to varicosities, evident on exam. - She will start wearing knee-high compression sleeves during the day and increase activity now that her back is healing well. - I stressed the importance of strengthening her hip abductors, quadriceps muscles, and using her calf muscles to assist with stability, support, and her circulation.

## 2023-07-28 NOTE — Progress Notes (Signed)
 [name] - 83 y.o. female MRN [id number]  Date of birth: Jul 23, 1940  PCP: Rosslyn Coons, MD  Subjective:  No chief complaint on file.  L1 compression fracture  HPI: Past Medical, Surgical, Social, and Family History Reviewed & Updated per EMR.   Patient is a 83 y.o. female here for follow up on her L1 compression fracture.  She has been keeping fairly inactive but has now started ambulating without a walker.  She has been doing chair squats without using her hands in order to strengthen her legs and this has been going well.  She has been taking tramadol  3 times a day and alternating ice and heat which is also helped.  She is sleeping well.  She has not had any new numbness or weakness distally.  Her right knee pain has markedly improved and the swelling in her leg has decreased as well as the bruising, however she has some residual swelling and discoloration which is making the inside of her lower extremity sensitive.  She has not had any ulcerations, bleeding, or trauma.  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   Hyperlipidemia    Impaired hearing    left ear, wears hearing aids   Insomnia    circadian rhythm component   Insomnia    Migraine 11/1986   Spillman   Nuclear sclerosis    OSA on CPAP 03/2005   uses cpap setting of 10   Osteoarthritis 2007   Deveschwar   Pain last week   left under breast pain    Plantar fasciitis    Rheumatic fever    Scarlet fever as child   Sleep apnea    Swallowing difficulty    Thyroid  disorder    Vitamin D  deficiency    Vitreous degeneration of right eye     Current Outpatient Medications on File Prior to Visit  Medication Sig Dispense Refill   aspirin EC 81 MG tablet Take 81 mg by mouth  daily.     azelastine (ASTELIN) 0.1 % nasal spray Place into the nose.     doxepin  (SINEQUAN ) 100 MG capsule Take 1 capsule (100 mg total) by mouth at bedtime. 90 capsule 1   doxepin  (SINEQUAN ) 50 MG capsule Take 1 capsule (50 mg) twice daily. Take 2 (100 mg) capsules at night. 360 capsule 1   Eszopiclone  3 MG TABS Take 3 mg by mouth at bedtime. Take immediately before bedtime     fluticasone (FLONASE) 50 MCG/ACT nasal spray      hydrochlorothiazide (MICROZIDE) 12.5 MG capsule Take 12.5 mg by mouth daily.     ibuprofen  (ADVIL ) 600 MG tablet Take 1 tablet (600 mg total) by mouth every 6 (six) hours as needed for up to 30 doses for mild pain (pain score 1-3) or moderate pain (pain score 4-6). 30 tablet 0   levothyroxine (SYNTHROID, LEVOTHROID) 200 MCG tablet Take 200 mcg by mouth daily before breakfast.     LORazepam  (ATIVAN ) 1 MG tablet Take 1 tablet (1 mg total) by mouth 2 (two) times daily. 30 tablet 1   lubiprostone (AMITIZA) 24 MCG capsule Take 24 mcg by mouth 2 (two) times daily with a meal.  melatonin 3 MG TABS tablet Take 20 mg by mouth.     montelukast (SINGULAIR) 10 MG tablet TAKE 1 TABLET BY MOUTH EVERY DAY (Patient not taking: Reported on 05/11/2023) 90 tablet 3   omeprazole (PRILOSEC) 40 MG capsule Take 1 capsule by mouth daily.     polyethylene glycol (MIRALAX  / GLYCOLAX ) packet Take 17 g by mouth daily.     traMADol  (ULTRAM ) 50 MG tablet Take 2 tablets (100 mg total) by mouth in the morning, at noon, and at bedtime. 180 tablet 1   UBRELVY 50 MG TABS Take by mouth as needed.     Vitamin D , Ergocalciferol , (DRISDOL ) 1.25 MG (50000 UNIT) CAPS capsule Take 1 capsule (50,000 Units total) by mouth every 7 (seven) days. 13 capsule 0   No current facility-administered medications on file prior to visit.    Past Surgical History:  Procedure Laterality Date   BREAST BIOPSY Left 6/00   [facility]   BREAST EXCISIONAL BIOPSY Left 1998   DE QUERVAIN'S RELEASE Right 10/97   Sypher   left  breast biopsy     ROOT CANAL  02/11/2012   TONSILLECTOMY  age 89   TONSILLECTOMY AND ADENOIDECTOMY  1952   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   TOTAL KNEE ARTHROPLASTY Right 11/08/2012   Procedure: RIGHT TOTAL KNEE ARTHROPLASTY;  Surgeon: Aurther Blue, MD;  Location: [facility];  Service: Orthopedics;  Laterality: Right;   TUBAL LIGATION      Allergies  Allergen Reactions   Penicillins Anaphylaxis    Tongue swelling   Codeine Nausea Only   Oxycodone -Acetaminophen     Penicillamine    Provigil [Modafinil]    Seroquel [Quetiapine Fumarate]    Xyrem [Oxybate]     hallucination   Ciprofloxacin Rash   Monistat [Miconazole] Rash   Terazol [Terconazole] Rash        Objective:  Physical Exam: VS: BP:131/76  HR: bpm  TEMP: ( )  RESP:   HT:5\' 6"  (167.6 cm)   WT:190 lb (86.2 kg)  BMI:30.68  Gen: NAD, speaks clearly, comfortable in exam room Respiratory: Normal respiratory effort on room air. No signs of distress Skin: No rashes, abrasions, or ecchymosis MSK:  The lumbar spine is nontender to palpation There is some tightness of the paraspinal musculature but no overlying erythema, or warmth Sit to stand is slow but stable The right knee has a well-healed vertical incision anteriorly There is 2+ edema of the right greater than left lower extremities with numerous varicosities and blanchable erythema. Mild tenderness to palpation over the medial aspect of the distal third of her anterior lower extremity. No ulceration, bleeding, or signs of infection The right ankle has full range of motion No sensory deficits distally    Assessment & Plan:   Closed compression fracture of body of L1 vertebra (HCC) - The patient's pain has been well-controlled and she is now able to get around without a walker.  She has been taking the tramadol  3 times a day which we discussed decreasing to twice a day - She will also start the use of topical lidocaine  patches which she has not tried  previously - At this point she is safe to start physical therapy.  She may benefit from initial therapy in the pool. - Standing hip abduction series was given to start strengthening of her abductors for increased stability - We will follow-up in 6 weeks  Venous stasis of lower extremity  - The patient's previous right lower extremity hematoma has resolved.  She has residual edema which is likely due to varicosities, evident on exam. - She will start wearing knee-high compression sleeves during the day and increase activity now that her back is healing well. - I stressed the importance of strengthening her hip abductors, quadriceps muscles, and using her calf muscles to assist with stability, support, and her circulation.     Berneda Bridges MD [facility] Sports Medicine Fellow  I observed and examined the patient with the [facility] resident and agree with assessment and plan.  Note reviewed and modified by me. KB fields, MD

## 2023-07-28 NOTE — Assessment & Plan Note (Signed)
-   The patient's pain has been well-controlled and she is now able to get around without a walker.  She has been taking the tramadol  3 times a day which we discussed decreasing to twice a day - She will also start the use of topical lidocaine  patches which she has not tried previously - At this point she is safe to start physical therapy.  She may benefit from initial therapy in the pool. - Standing hip abduction series was given to start strengthening of her abductors for increased stability - We will follow-up in 6 weeks

## 2023-08-03 ENCOUNTER — Other Ambulatory Visit: Payer: Self-pay | Admitting: Endocrinology

## 2023-08-03 DIAGNOSIS — Z1231 Encounter for screening mammogram for malignant neoplasm of breast: Secondary | ICD-10-CM

## 2023-08-11 ENCOUNTER — Encounter: Payer: Self-pay | Admitting: Sports Medicine

## 2023-08-12 ENCOUNTER — Ambulatory Visit

## 2023-08-24 ENCOUNTER — Encounter: Payer: Self-pay | Admitting: Neurology

## 2023-08-24 ENCOUNTER — Ambulatory Visit: Admitting: Family Medicine

## 2023-08-27 ENCOUNTER — Ambulatory Visit: Admitting: Sports Medicine

## 2023-08-31 ENCOUNTER — Encounter: Payer: Self-pay | Admitting: Family Medicine

## 2023-08-31 ENCOUNTER — Ambulatory Visit (INDEPENDENT_AMBULATORY_CARE_PROVIDER_SITE_OTHER): Admitting: Family Medicine

## 2023-08-31 VITALS — BP 135/70 | Ht 66.0 in | Wt 190.0 lb

## 2023-08-31 DIAGNOSIS — T458X5A Adverse effect of other primarily systemic and hematological agents, initial encounter: Secondary | ICD-10-CM

## 2023-08-31 DIAGNOSIS — M87180 Osteonecrosis due to drugs, jaw: Secondary | ICD-10-CM

## 2023-08-31 DIAGNOSIS — E559 Vitamin D deficiency, unspecified: Secondary | ICD-10-CM

## 2023-08-31 DIAGNOSIS — E039 Hypothyroidism, unspecified: Secondary | ICD-10-CM

## 2023-08-31 DIAGNOSIS — M8000XA Age-related osteoporosis with current pathological fracture, unspecified site, initial encounter for fracture: Secondary | ICD-10-CM | POA: Diagnosis not present

## 2023-08-31 DIAGNOSIS — S22080A Wedge compression fracture of T11-T12 vertebra, initial encounter for closed fracture: Secondary | ICD-10-CM | POA: Diagnosis not present

## 2023-08-31 DIAGNOSIS — S32010A Wedge compression fracture of first lumbar vertebra, initial encounter for closed fracture: Secondary | ICD-10-CM | POA: Diagnosis not present

## 2023-08-31 NOTE — Patient Instructions (Addendum)
 Osteoporosis Work-up and Treatment  Labwork to obtain: CMP, CBC with diff, 25-OH Vit D, TSH, PTH, SPEP   Supplementation: Make sure you're getting 1200mg  calcium and 800 IU of vitamin D  daily  Exercise: Strength/resistance-based exercise (i.e. free weights, lifting); weight-based (yoga, tai-chi, pilates)  Medication Options: Evenity - this builds bone, is an injection once a month for 12 months - after you finish this you would go on a medicine that keeps it built up (Prolia) Prolia - injection that just maintains bone - given every 6 months here in the office  Tymlos or Forteo - these build bone, in a daily injection that you would take for 2 years.  After you finish this you would go on a medicine that keeps it built up (such as Prolia )

## 2023-08-31 NOTE — Progress Notes (Unsigned)
 DATE OF VISIT: 08/31/2023        [name] DOB: [birth year] MRN: [id number]  CC:  Osteoporosis eval  History- [name] is a 83 y.o.  female for evaluation and treatment of osteoporosis Accompanied by husband, [name], today Patient with history of recent falls Sustained L1 compression fracture after a fall 05/14/2023 Recent fall 08/07/23 with T12 compression fx - seen at [facility] Was recommended by Dr. Harvey to be evaluated for osteoporosis treatment  Prior treatment: Fosamax - had issue with bone loss in the jaw & needed to stop.  Dentist noticed loss of bone around the jaw - last fosamax over 10+ years ago - does not take Ca or Vit D  History of Hip, Spine, or Wrist Fracture: YES - L1 compression fx after fall 05/14/23 - T12 compression fx after fall 08/07/23  Heart disease or stroke: no Cancer: basal cell skin CA Kidney Disease: no Gastric/Peptic Ulcer: no Gastric bypass surgery: no Severe GERD: no History of seizures: no Age at Menopause: 83yo Hysterectomy: no Calcium intake: none Vitamin D  intake: none Hormone replacement therapy: Yes - for family hx of breast CA, was on for 1.5 years Smoking history: none Alcohol: very little Exercise: pool/water  aerobics, walking in the pool, 5-6 days/wk - has not been able since recent fall; using walker currently Major dental work in past year: no, no scheduled dental Parents with hip/spine fracture: no  Hypothyroid on synthroid - sees Endocrinology in Sept 2025  02/10/23 - Vit D 44  07/23/22 - CMP - normal - CBC - normal - TSH - 0.01  DEXA 02/12/17 - [facility] - comparison 09/2013 - Lspine Tscore -1.4 (improved 6%) - Lt femoral neck Tscore -1.5 (improved 3%) - Rt femoral neck Tscore -1.4 (improved 2%) - FRAX 10 year risk: -- hip fx= 2% -- major osteoporotic fx= 11% - recommend f/u in 3 years (2021)    Past Medical History Past Medical History:  Diagnosis Date   Anxiety    Cancer (HCC)     basal cell skin biopsies X2   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   Chronic fatigue    Constipation    Depression    Depression    Fibromyalgia 09/1996   Tr[place]   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   Hyperlipidemia    Impaired hearing    left ear, wears hearing aids   Insomnia    circadian rhythm component   Insomnia    Migraine 11/1986   Spillman   Nuclear sclerosis    OSA on CPAP 03/2005   uses cpap setting of 10   Osteoarthritis 2007   Deveschwar   Pain last week   left under breast pain    Plantar fasciitis    Rheumatic fever    Scarlet fever as child   Sleep apnea    Swallowing difficulty    Thyroid  disorder    Vitamin D  deficiency    Vitreous degeneration of right eye     Past Surgical History Past Surgical History:  Procedure Laterality Date   BREAST BIOPSY Left 6/00   [place]   BREAST EXCISIONAL BIOPSY Left 1998   DE QUERVAIN'S RELEASE Right 10/97   Sypher   left breast biopsy     ROOT CANAL  02/11/2012   TONSILLECTOMY  age 41   TONSILLECTOMY AND ADENOIDECTOMY  1952   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   TOTAL KNEE ARTHROPLASTY Right 11/08/2012   Procedure: RIGHT TOTAL KNEE ARTHROPLASTY;  Surgeon: Dempsey LULLA Moan, MD;  Location: [facility];  Service: Orthopedics;  Laterality: Right;   TUBAL LIGATION      Medications Current Outpatient Medications  Medication Sig Dispense Refill   aspirin EC 81 MG tablet Take 81 mg by mouth daily.     azelastine (ASTELIN) 0.1 % nasal spray Place into the nose.     doxepin  (SINEQUAN ) 100 MG capsule Take 1 capsule (100 mg total) by mouth at bedtime. 90 capsule 1   doxepin  (SINEQUAN ) 50 MG capsule Take 1 capsule (50 mg) twice daily. Take 2 (100 mg) capsules at night. 360 capsule 1   Eszopiclone  3 MG TABS Take 3 mg by mouth at bedtime. Take immediately before bedtime     fluticasone (FLONASE) 50 MCG/ACT nasal spray      hydrochlorothiazide (MICROZIDE) 12.5 MG capsule Take 12.5 mg by mouth daily.      ibuprofen  (ADVIL ) 600 MG tablet Take 1 tablet (600 mg total) by mouth every 6 (six) hours as needed for up to 30 doses for mild pain (pain score 1-3) or moderate pain (pain score 4-6). 30 tablet 0   levothyroxine (SYNTHROID, LEVOTHROID) 200 MCG tablet Take 200 mcg by mouth daily before breakfast.     LORazepam  (ATIVAN ) 1 MG tablet Take 1 tablet (1 mg total) by mouth 2 (two) times daily. 30 tablet 1   lubiprostone (AMITIZA) 24 MCG capsule Take 24 mcg by mouth 2 (two) times daily with a meal.     melatonin 3 MG TABS tablet Take 20 mg by mouth.     montelukast (SINGULAIR) 10 MG tablet TAKE 1 TABLET BY MOUTH EVERY DAY (Patient not taking: Reported on 05/11/2023) 90 tablet 3   omeprazole (PRILOSEC) 40 MG capsule Take 1 capsule by mouth daily.     polyethylene glycol (MIRALAX  / GLYCOLAX ) packet Take 17 g by mouth daily.     traMADol  (ULTRAM ) 50 MG tablet Take 2 tablets (100 mg total) by mouth in the morning, at noon, and at bedtime. 180 tablet 1   UBRELVY 50 MG TABS Take by mouth as needed.     Vitamin D , Ergocalciferol , (DRISDOL ) 1.25 MG (50000 UNIT) CAPS capsule Take 1 capsule (50,000 Units total) by mouth every 7 (seven) days. 13 capsule 0   No current facility-administered medications for this visit.    Allergies is allergic to penicillins, codeine, oxycodone -acetaminophen , penicillamine, provigil [modafinil], seroquel [quetiapine fumarate], xyrem [oxybate], ciprofloxacin, monistat [miconazole], and terazol [terconazole].  Family History - reviewed per EMR and intake form  Social History   reports that she does not currently use alcohol.  reports that she has never smoked. She has been exposed to tobacco smoke. She has never used smokeless tobacco.  reports no history of drug use.   EXAM: Vitals: BP 135/70 (BP Location: Left Arm, Patient Position: Sitting)   Ht 5' 6 (1.676 m)   Wt 190 lb (86.2 kg)   LMP 02/25/1995 (Approximate)   BMI 30.67 kg/m  General: AOx3, NAD, pleasant SKIN: no  rashes or lesions, skin clean, dry, intact MSK: Seated comfortably in exam room chair.  Ambulating with the assistance of a walker.  IMAGING: CT L-spine without contrast 05/14/2023 showing: IMPRESSION: 1. Subtle acute 15% superior endplate compression fracture at L1. No definite middle column involvement or bony retropulsion. No posterior element involvement. 2. Mildly prominent fluid density in the neural foramina at T12-L1, favor dilated nerve root sleeves over schwannomas, although there is a slightly scalloped undersurface of the right T12 pedicle. If  this is a clinically significant distinction, MRI of the lumbar spine with and without contrast could be employed. 3. Lumbar spondylosis and degenerative disc disease, causing suspected right greater than left subarticular lateral recess stenosis at L4-5 and mild bilateral subarticular lateral recess stenosis at L5-S1. 4. 3 mm degenerative anterolisthesis at L4-5. 5.  Aortic Atherosclerosis (ICD10-I70.0).  Assessment & Plan Age-related osteoporosis with current pathological fracture, initial encounter Osteoporosis with associated pathologic fracture including T12 and L1 compression fracture status post recent falls. - Prior treatment with Fosamax over 10 years ago, patient reports issues with bone loss in the jaw suggestive of possible osteonecrosis of the jaw.  Fosamax was stopped at that time. - Patient not currently taking calcium or vitamin D  - Patient not currently able to do weightbearing exercise due to her compression fractures  Plan: - Notes from visits with [facility] reviewed during the visit today - Last DEXA scan was in 2018, according to that she had osteopenia and did have improved bone density compared to previous in 2015.  Around that time her Fosamax was stopped due to what sounds to be osteonecrosis of the jaw. - Imaging: Will order updated DEXA scan, previous were completed [facility].   Will try to get this completed at the same location - Labs: Will obtain CBC, CMP, PTH, TSH, vitamin D , SPEP - She will follow-up with me pending DEXA scan and labs.  With the recent compression fractures of the lumbar spine and the thoracic spine, patient is clearly at high risk for morbidity associated with future insufficiency fractures.  Would be a candidate for osteoporosis treatment, but will need to take into account prior history of what sounds to be osteonecrosis of the jaw. - Will further discuss treatment options pending DEXA and lab results Closed compression fracture of body of L1 vertebra (HCC) L1 compression fracture status post fall March 2025  Plan: - Has compression fracture of L1 and T12 consistent with underlying osteoporosis. - Should continue to follow with [facility] as scheduled - Will proceed with osteoporosis workup as noted above Compression fracture of T12 vertebra, initial encounter (HCC) Acute T12 compression fracture status post fall 08/07/2023  Plan: - Has compression fracture of L1 and T12 consistent with underlying osteoporosis. - Should continue to follow with [facility] as scheduled - Will proceed with osteoporosis workup as noted above Vitamin D  deficiency Prior history of vitamin D  deficiency, recent compression fractures of the thoracic and lumbar spine - Currently taking calcium and vitamin D   Plan: - Labs: Will obtain vitamin D , CMP, PTH to further assess for underlying osteoporosis - Recommend starting supplementation with calcium 1200 mg and vitamin D  800 international units daily - Will provide further recommendations based on vitamin D  results Hypothyroidism, unspecified type Hypothyroidism, following with endocrinology.  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  Will need to discuss potential risk-benefit with patient regarding Prolia and/or Evenity if indicated.  Could consider Tymlos or Forteo since there is no risk for ONJ  I personally spent a total of 45 minutes in the care of the patient today including preparing to see the patient, getting/reviewing  separately obtained history, performing a medically appropriate exam/evaluation, counseling and educating, placing orders, and documenting clinical information in the EHR.   Patient expressed understanding & agreement with above.  Encounter Diagnoses  Name Primary?   Age-related osteoporosis with current pathological fracture, initial encounter Yes   Closed compression fracture of body of L1 vertebra (HCC)    Compression fracture of T12 vertebra, initial encounter (HCC)    Vitamin D  deficiency    Hypothyroidism, unspecified type    Bisphosphonate-associated osteonecrosis of the jaw (HCC)     Orders Placed This Encounter  Procedures   DG Bone Density   CBC with Differential/Platelet   Comprehensive metabolic panel with GFR   TSH   Protein electrophoresis, serum   Parathyroid  hormone, intact (no Ca)   Basic metabolic panel with GFR   VITAMIN D  25 Hydroxy (Vit-D Deficiency, Fractures)    Orders Placed This Encounter  Procedures   DG Bone Density   CBC with Differential/Platelet   Comprehensive metabolic panel with GFR   TSH   Protein electrophoresis, serum   Parathyroid  hormone, intact (no Ca)   Basic metabolic panel with GFR   VITAMIN D  25 Hydroxy (Vit-D Deficiency, Fractures)

## 2023-09-01 NOTE — Assessment & Plan Note (Signed)
 Hypothyroidism, following with endocrinology.  Last T SH was quite low  Plan: - Labs: Will check updated TSH - Continue levothyroxine per endocrinology

## 2023-09-01 NOTE — Assessment & Plan Note (Signed)
 Prior history of vitamin D  deficiency, recent compression fractures of the thoracic and lumbar spine - Currently taking calcium and vitamin D   Plan: - Labs: Will obtain vitamin D , CMP, PTH to further assess for underlying osteoporosis - Recommend starting supplementation with calcium 1200 mg and vitamin D  800 international units daily - Will provide further recommendations based on vitamin D  results

## 2023-09-01 NOTE — Assessment & Plan Note (Signed)
 L1 compression fracture status post fall March 2025  Plan: - Has compression fracture of L1 and T12 consistent with underlying osteoporosis. - Should continue to follow with [facility] as scheduled - Will proceed with osteoporosis workup as noted above

## 2023-09-02 ENCOUNTER — Encounter: Payer: Self-pay | Admitting: Sports Medicine

## 2023-09-02 LAB — CBC WITH DIFFERENTIAL/PLATELET
Basophils Absolute: 0 x10E3/uL (ref 0.0–0.2)
Basos: 0 %
EOS (ABSOLUTE): 0.2 x10E3/uL (ref 0.0–0.4)
Eos: 3 %
Hematocrit: 43.7 % (ref 34.0–46.6)
Hemoglobin: 14 g/dL (ref 11.1–15.9)
Immature Grans (Abs): 0 x10E3/uL (ref 0.0–0.1)
Immature Granulocytes: 0 %
Lymphocytes Absolute: 1.7 x10E3/uL (ref 0.7–3.1)
Lymphs: 24 %
MCH: 28.5 pg (ref 26.6–33.0)
MCHC: 32 g/dL (ref 31.5–35.7)
MCV: 89 fL (ref 79–97)
Monocytes Absolute: 0.5 x10E3/uL (ref 0.1–0.9)
Monocytes: 7 %
Neutrophils Absolute: 4.6 x10E3/uL (ref 1.4–7.0)
Neutrophils: 66 %
Platelets: 260 x10E3/uL (ref 150–450)
RBC: 4.91 x10E6/uL (ref 3.77–5.28)
RDW: 12.5 % (ref 11.7–15.4)
WBC: 7 x10E3/uL (ref 3.4–10.8)

## 2023-09-02 LAB — COMPREHENSIVE METABOLIC PANEL WITH GFR
ALT: 13 IU/L (ref 0–32)
AST: 15 IU/L (ref 0–40)
Albumin: 4.1 g/dL (ref 3.7–4.7)
Alkaline Phosphatase: 112 IU/L (ref 44–121)
BUN/Creatinine Ratio: 37 — ABNORMAL HIGH (ref 12–28)
BUN: 23 mg/dL (ref 8–27)
Bilirubin Total: <0.2 mg/dL (ref 0.0–1.2)
CO2: 24 mmol/L (ref 20–29)
Calcium: 9.4 mg/dL (ref 8.7–10.3)
Chloride: 100 mmol/L (ref 96–106)
Creatinine, Ser: 0.63 mg/dL (ref 0.57–1.00)
Globulin, Total: 2.2 g/dL (ref 1.5–4.5)
Glucose: 115 mg/dL — ABNORMAL HIGH (ref 70–99)
Potassium: 4 mmol/L (ref 3.5–5.2)
Sodium: 139 mmol/L (ref 134–144)
Total Protein: 6.3 g/dL (ref 6.0–8.5)
eGFR: 88 mL/min/1.73 (ref >59)

## 2023-09-02 LAB — PROTEIN ELECTROPHORESIS, SERUM
A/G Ratio: 1.5 (ref 0.7–1.7)
Albumin ELP: 3.8 g/dL (ref 2.9–4.4)
Alpha 1: 0.2 g/dL (ref 0.0–0.4)
Alpha 2: 0.6 g/dL (ref 0.4–1.0)
Beta: 0.9 g/dL (ref 0.7–1.3)
Gamma Globulin: 0.8 g/dL (ref 0.4–1.8)
Globulin, Total: 2.5 g/dL (ref 2.2–3.9)

## 2023-09-02 LAB — PARATHYROID HORMONE, INTACT (NO CA): PTH: 16 pg/mL (ref 15–65)

## 2023-09-02 LAB — VITAMIN D 25 HYDROXY (VIT D DEFICIENCY, FRACTURES): Vit D, 25-Hydroxy: 31.6 ng/mL (ref 30.0–100.0)

## 2023-09-02 LAB — TSH: TSH: 0.01 u[IU]/mL — ABNORMAL LOW (ref 0.450–4.500)

## 2023-09-04 ENCOUNTER — Ambulatory Visit (HOSPITAL_BASED_OUTPATIENT_CLINIC_OR_DEPARTMENT_OTHER): Admitting: Physical Therapy

## 2023-09-08 ENCOUNTER — Other Ambulatory Visit: Payer: Self-pay | Admitting: Physician Assistant

## 2023-09-08 DIAGNOSIS — M545 Low back pain, unspecified: Secondary | ICD-10-CM

## 2023-09-09 ENCOUNTER — Inpatient Hospital Stay
Admission: RE | Admit: 2023-09-09 | Discharge: 2023-09-09 | Discharge status: Home / Self Care | Source: Ambulatory Visit | Attending: Physician Assistant | Admitting: Physician Assistant

## 2023-09-09 ENCOUNTER — Other Ambulatory Visit

## 2023-09-09 DIAGNOSIS — M545 Low back pain, unspecified: Secondary | ICD-10-CM

## 2023-09-10 ENCOUNTER — Ambulatory Visit (INDEPENDENT_AMBULATORY_CARE_PROVIDER_SITE_OTHER): Admitting: Sports Medicine

## 2023-09-10 VITALS — BP 122/80 | Ht 66.0 in | Wt 190.0 lb

## 2023-09-10 DIAGNOSIS — S32010A Wedge compression fracture of first lumbar vertebra, initial encounter for closed fracture: Secondary | ICD-10-CM | POA: Diagnosis not present

## 2023-09-10 MED ORDER — TRAMADOL HCL 50 MG PO TABS: 100.0000 mg | ORAL_TABLET | Freq: Three times a day (TID) | ORAL | 2 refills | Status: DC

## 2023-09-10 NOTE — Progress Notes (Signed)
 Chief complaint follow-up of lumbar compression fractures  Patient had an L1 compression fracture after a fall in April She was recovering from this and also from a large hematoma near her right knee when she had another fall On June 16 she had a T12 compression fracture  She has been evaluated by trauma team and put on a walker and referred for us  to consider osteoporosis treatment On July 7 she saw Dr. Teressa.  Most recent T-scores were from 2023 and were in the range of -1.5 in her spine and hips.  She has a possible history of osteonecrosis of the jaw after taking Fosamax in the past  CT scan of her spine taken this past week shows that both T12 and L1 have compression and have lost about 30% of their vertebral heights  She feels that she still is in fairly significant pain that is lessened when she does use her walker Sitting too long or standing too long will increase the pain She has been pretty inactive and would like to try to get back to doing some activity  Right knee and lower leg shows some increase in swelling since she had the hematoma after the fall. Her prosthesis looked intact on x-ray.  Physical examination Pleasant older lady in no acute distress BP 122/80   Ht 5' 6 (1.676 m)   Wt 190 lb (86.2 kg)   LMP 02/25/1995 (Approximate)   BMI 30.67 kg/m   Gait is observed on the walker and she does seem fairly comfortable on this Palpation of the lumbar spine shows minimal tenderness to palpation Percussion of the upper lumbar spine does cause mild pain She can do a straight leg raise on the left or the right leg with only minimal pain referred to her back

## 2023-09-10 NOTE — Assessment & Plan Note (Signed)
 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

## 2023-09-10 NOTE — Patient Instructions (Signed)
 Call [facility] at [facility] to schedule your dexa scan. [address], [place], [place] [address] Phone: [phone] Schedule a follow up with Dr. Teressa to discuss osteoporosis medication options.  We will increase your Tramadol  to 2 pills three times daily. Our hope is to get you back doing aquatic therapy.

## 2023-09-10 NOTE — Progress Notes (Deleted)
 PCP: Nichole Senior, MD  Subjective:   HPI: Patient is a 83 y.o. female here for ***.  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   Hyperlipidemia    Impaired hearing    left ear, wears hearing aids   Insomnia    circadian rhythm component   Insomnia    Migraine 11/1986   [place]   Nuclear sclerosis    OSA on CPAP 03/2005   uses cpap setting of 10   Osteoarthritis 2007   [place]   Pain last week   left under breast pain    Plantar fasciitis    Rheumatic fever    Scarlet fever as child   Sleep apnea    Swallowing difficulty    Thyroid  disorder    Vitamin D  deficiency    Vitreous degeneration of right eye     Current Outpatient Medications on File Prior to Visit  Medication Sig Dispense Refill   aspirin EC 81 MG tablet Take 81 mg by mouth daily.     azelastine (ASTELIN) 0.1 % nasal spray Place into the nose.     doxepin  (SINEQUAN ) 100 MG capsule Take 1 capsule (100 mg total) by mouth at bedtime. 90 capsule 1   doxepin  (SINEQUAN ) 50 MG capsule Take 1 capsule (50 mg) twice daily. Take 2 (100 mg) capsules at night. 360 capsule 1   Eszopiclone  3 MG TABS Take 3 mg by mouth at bedtime. Take immediately before bedtime     fluticasone (FLONASE) 50 MCG/ACT nasal spray      hydrochlorothiazide (MICROZIDE) 12.5 MG capsule Take 12.5 mg by mouth daily.     ibuprofen  (ADVIL ) 600 MG tablet Take 1 tablet (600 mg total) by mouth every 6 (six) hours as needed for up to 30 doses for mild pain (pain score 1-3) or moderate pain (pain score 4-6). 30 tablet 0   levothyroxine (SYNTHROID, LEVOTHROID) 200 MCG tablet Take 200 mcg by mouth daily before breakfast.     LORazepam  (ATIVAN ) 1 MG tablet Take 1 tablet (1 mg total) by mouth 2 (two) times  daily. 30 tablet 1   lubiprostone (AMITIZA) 24 MCG capsule Take 24 mcg by mouth 2 (two) times daily with a meal.     melatonin 3 MG TABS tablet Take 20 mg by mouth.     montelukast (SINGULAIR) 10 MG tablet TAKE 1 TABLET BY MOUTH EVERY DAY (Patient not taking: Reported on 05/11/2023) 90 tablet 3   omeprazole (PRILOSEC) 40 MG capsule Take 1 capsule by mouth daily.     polyethylene glycol (MIRALAX  / GLYCOLAX ) packet Take 17 g by mouth daily.     traMADol  (ULTRAM ) 50 MG tablet Take 2 tablets (100 mg total) by mouth in the morning, at noon, and at bedtime. 180 tablet 1   UBRELVY 50 MG TABS Take by mouth as needed.     Vitamin D , Ergocalciferol , (DRISDOL ) 1.25 MG (50000 UNIT) CAPS capsule Take 1 capsule (50,000 Units total) by mouth every 7 (seven) days. 13 capsule 0   No current facility-administered medications on file prior to visit.    Past Surgical History:  Procedure Laterality Date   BREAST BIOPSY Left 6/00  [place]   BREAST EXCISIONAL BIOPSY Left 1998   DE QUERVAIN'S RELEASE Right 10/97   [place]   left breast biopsy     ROOT CANAL  02/11/2012   TONSILLECTOMY  age 5   TONSILLECTOMY AND ADENOIDECTOMY  1952   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   TOTAL KNEE ARTHROPLASTY Right 11/08/2012   Procedure: RIGHT TOTAL KNEE ARTHROPLASTY;  Surgeon: Dempsey LULLA Moan, MD;  Location: [facility];  Service: Orthopedics;  Laterality: Right;   TUBAL LIGATION      Allergies  Allergen Reactions   Penicillins Anaphylaxis    Tongue swelling   Codeine Nausea Only   Oxycodone -Acetaminophen     Penicillamine    Provigil [Modafinil]    Seroquel [Quetiapine Fumarate]    Xyrem [Oxybate]     hallucination   Ciprofloxacin Rash   Monistat [Miconazole] Rash   Terazol [Terconazole] Rash    LMP 02/25/1995 (Approximate)       No data to display              No data to display              Objective:  Physical Exam:  Gen: NAD, comfortable in exam room  ***   Assessment & Plan:  1. ***

## 2023-09-22 ENCOUNTER — Ambulatory Visit

## 2023-09-23 ENCOUNTER — Encounter: Payer: Self-pay | Admitting: Family Medicine

## 2023-09-25 ENCOUNTER — Ambulatory Visit
Admission: RE | Admit: 2023-09-25 | Discharge: 2023-09-25 | Disposition: A | Discharge status: Home / Self Care | Source: Ambulatory Visit | Attending: Endocrinology | Admitting: Endocrinology

## 2023-09-25 DIAGNOSIS — Z1231 Encounter for screening mammogram for malignant neoplasm of breast: Secondary | ICD-10-CM

## 2023-09-29 ENCOUNTER — Ambulatory Visit (HOSPITAL_BASED_OUTPATIENT_CLINIC_OR_DEPARTMENT_OTHER): Admitting: Physical Therapy

## 2023-10-02 ENCOUNTER — Other Ambulatory Visit: Payer: Self-pay

## 2023-10-02 DIAGNOSIS — S22080A Wedge compression fracture of T11-T12 vertebra, initial encounter for closed fracture: Secondary | ICD-10-CM

## 2023-10-02 DIAGNOSIS — S32010A Wedge compression fracture of first lumbar vertebra, initial encounter for closed fracture: Secondary | ICD-10-CM

## 2023-10-02 DIAGNOSIS — M8000XA Age-related osteoporosis with current pathological fracture, unspecified site, initial encounter for fracture: Secondary | ICD-10-CM

## 2023-10-09 ENCOUNTER — Other Ambulatory Visit: Payer: Self-pay | Admitting: *Deleted

## 2023-10-09 ENCOUNTER — Other Ambulatory Visit: Payer: Self-pay

## 2023-10-09 MED ORDER — TYMLOS 3120 MCG/1.56ML ~~LOC~~ SOPN
80.0000 ug | PEN_INJECTOR | Freq: Every day | SUBCUTANEOUS | 12 refills | Status: DC
  Filled 2023-10-09: qty 1.56, fill #0
  Filled 2023-10-12: qty 4.68, 84d supply, fill #0

## 2023-10-09 MED ORDER — ASSURE ID DUO PRO PEN NEEDLES 31G X 5 MM MISC
1.0000 | Freq: Every day | 24 refills | Status: AC
  Filled 2023-10-09: qty 30, fill #0
  Filled 2023-11-12: qty 100, 100d supply, fill #0
  Filled 2023-11-16: qty 100, 90d supply, fill #0
  Filled 2024-02-09: qty 100, 90d supply, fill #1

## 2023-10-09 NOTE — Progress Notes (Signed)
 Pharmacy Patient Advocate Encounter   Received notification from Patient Pharmacy that prior authorization for Tymlos  is required/requested.   Insurance verification completed.   The patient is insured through [employer] .   Per test claim: PA required; PA submitted to above mentioned insurance via Latent Key/confirmation #/EOC [id number] Status is pending

## 2023-10-12 ENCOUNTER — Other Ambulatory Visit: Payer: Self-pay

## 2023-10-12 ENCOUNTER — Other Ambulatory Visit: Payer: Self-pay | Admitting: *Deleted

## 2023-10-12 ENCOUNTER — Other Ambulatory Visit (HOSPITAL_COMMUNITY): Payer: Self-pay

## 2023-10-12 MED ORDER — TYMLOS 3120 MCG/1.56ML ~~LOC~~ SOPN
80.0000 ug | PEN_INJECTOR | Freq: Every day | SUBCUTANEOUS | 3 refills | Status: AC
  Filled 2023-10-12: qty 4.68, 84d supply, fill #0
  Filled 2023-11-12: qty 4.68, 90d supply, fill #0
  Filled 2024-02-01 – 2024-02-04 (×2): qty 4.68, 90d supply, fill #1

## 2023-10-12 NOTE — Progress Notes (Addendum)
 Specialty Pharmacy Initial Fill Coordination Note  [name] is a 83 y.o. female contacted today regarding initial fill of specialty medication(s) Abaloparatide  (Tymlos )   Patient requested [name] at [facility] at [place] date: 10/14/23   Medication will be filled on 8/19.   Patient is aware of $52.41 copayment.

## 2023-10-12 NOTE — Progress Notes (Signed)
 Specialty Pharmacy Initiation Note   [name] is a 83 y.o. female who will be followed by the specialty pharmacy service for RxSp Osteoporosis    Review of administration, indication, effectiveness, safety, potential side effects, storage/disposable, and missed dose instructions occurred today for patient's specialty medication(s) Abaloparatide  (Tymlos )     Patient/Caregiver asked additional questions regarding self injection technique and training. Discussed the option of setting up an injection training appointment with her provider if they will accommodate this. Alternatively, provided her with a link and telephone number for the patient support program through the manufacturer to supplement our conversation today.  Patient's therapy is appropriate to: Initiate    Goals Addressed             This Visit's Progress    Prevent or reduce bone loss       Patient is on track. Patient will work on increased adherence and adhere to provider and/or lab appointments         Delon CHRISTELLA Brow Specialty Pharmacist

## 2023-10-12 NOTE — Progress Notes (Addendum)
 Pharmacy Patient Advocate Encounter  Insurance verification completed.   The patient is insured through [facility]   Ran test claim for Tymlos . Co-pay is $52.41.  This test claim was processed through [facility]- copay amounts may vary at other pharmacies due to pharmacy/plan contracts, or as the patient moves through the different stages of their insurance plan.

## 2023-10-12 NOTE — Progress Notes (Deleted)
 Specialty Pharmacy Initial Fill Coordination Note  [name] is a 83 y.o. female contacted today regarding initial fill of specialty medication(s) Abaloparatide  (Tymlos )   Patient requested [name] at [facility] at [place] date: 10/13/23   Medication will be filled on 8/19.   Patient is aware of $52.41 copayment.

## 2023-10-12 NOTE — Progress Notes (Signed)
 Pharmacy Patient Advocate Encounter  Received notification from [employer] that Prior Authorization for Tymlos  has been APPROVED from 10/12/2023 to 09/30/25   PA #/Case ID/Reference #: [id number]

## 2023-10-13 ENCOUNTER — Other Ambulatory Visit: Payer: Self-pay

## 2023-10-13 NOTE — Progress Notes (Signed)
 Patient called back wishing to cancel Tymlos  for now as she is concerned about side effects. She will call us  back if she decides to fill.

## 2023-10-14 ENCOUNTER — Encounter: Payer: Self-pay | Admitting: Family Medicine

## 2023-10-22 ENCOUNTER — Ambulatory Visit (INDEPENDENT_AMBULATORY_CARE_PROVIDER_SITE_OTHER): Admitting: Sports Medicine

## 2023-10-22 VITALS — Ht 66.0 in | Wt 190.0 lb

## 2023-10-22 DIAGNOSIS — R296 Repeated falls: Secondary | ICD-10-CM | POA: Diagnosis not present

## 2023-10-22 NOTE — Patient Instructions (Signed)
 Chair to stand 10 repeats If possible do more than once a day  Walking with arm swing - slow 5 times down and back hallway  Faster walking but use walking sticks for balance - time your self 3 repeats of the hallway  Balance exercises Standing use 1 hand for stability Count of 10 repeat 3 times each side  Quadriceps strength Keep doing straight leg lifts Keep doing some knee flexion  Step count Below 2500 you are in a poor range 2500 to 3500 is fair 3500 to 5000 is good 7500 gets you to a high level  Buy hiking poles

## 2023-10-22 NOTE — Progress Notes (Signed)
 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 That is also a risk with OP medicines  and she does not wish to take that chance  Biggest issue is getting strength and balance better She realizes this and is gradually increasing after her recent injuries  PE Elderly F in NAD Ht 5' 6 (1.676 m)   Wt 190 lb (86.2 kg)   LMP 02/25/1995 (Approximate)   BMI 30.67 kg/m   Gait assessment - has too much horizontal and AP sway of trunk with normal walking Gait is slow and hesitant 18 yard walk and turn - 24 secs fast At normal walk > 30 secs  Sit to stand is difficult without hands  Difficulty with 1 foot stand even when using hand for balance  Lateral walking able to do 5 steps RT and LT with no balance issues

## 2023-10-22 NOTE — Assessment & Plan Note (Signed)
 She is high risk for falls and that is key need for TX She is hesitant to take OP meds with good reasons  Plan Aquatic PT Home exercises for quad strength Hip flexor strength  Sit to stand 1 foot balance  Once formal PT ends we will need to get her into a maintenance program as she currenlty is high risk for falls.

## 2023-11-03 ENCOUNTER — Other Ambulatory Visit: Payer: Self-pay

## 2023-11-04 NOTE — Therapy (Signed)
 OUTPATIENT PHYSICAL THERAPY THORACOLUMBAR EVALUATION   Patient Name: [name] MRN: [id number] DOB:Sep 04, 1940, 83 y.o., female Today's Date: 11/05/2023  END OF SESSION:  PT End of Session - 11/05/23 1441     Visit Number 1    Number of Visits 16    Date for PT Re-Evaluation 01/15/24    Authorization Type aetna Mcr    PT Start Time 1315    PT Stop Time 1400    PT Time Calculation (min) 45 min    Activity Tolerance Patient tolerated treatment well    Behavior During Therapy [facility] for tasks assessed/performed          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   Hyperlipidemia    Impaired hearing    left ear, wears hearing aids   Insomnia    circadian rhythm component   Insomnia    Migraine 11/1986   Spillman   Nuclear sclerosis    OSA on CPAP 03/2005   uses cpap setting of 10   Osteoarthritis 2007   Deveschwar   Pain last week   left under breast pain    Plantar fasciitis    Rheumatic fever    Scarlet fever as child   Sleep apnea    Swallowing difficulty    Thyroid  disorder    Vitamin D  deficiency    Vitreous degeneration of right eye    Past Surgical History:  Procedure Laterality Date   BREAST BIOPSY Left 6/00   Hardcastle   BREAST EXCISIONAL BIOPSY Left 1998   DE QUERVAIN'S RELEASE Right 10/97   Sypher   left breast biopsy     ROOT CANAL  02/11/2012   TONSILLECTOMY  age 73   TONSILLECTOMY AND ADENOIDECTOMY  1952   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   TOTAL KNEE ARTHROPLASTY Right 11/08/2012   Procedure: RIGHT TOTAL KNEE ARTHROPLASTY;  Surgeon: Dempsey LULLA Moan, MD;  Location: [facility];  Service: Orthopedics;  Laterality: Right;   TUBAL LIGATION     Patient Active Problem List   Diagnosis Date Noted   Venous  stasis of lower extremity 07/28/2023   Great toe pain, right 06/16/2023   Acute pain of right knee 05/28/2023   Closed compression fracture of body of first lumbar vertebra (HCC) 05/28/2023   Bilateral lower extremity edema 02/03/2022   Fluid retention 12/17/2021   Low back pain 10/03/2021   Atherosclerotic heart disease of native coronary artery without angina pectoris 05/27/2021   Diastolic dysfunction 05/27/2021   Neck pain 05/27/2021   Migraine 04/23/2021   Generalized obesity 04/23/2021   Depression 03/18/2021   Lumbar spine pain 08/30/2020   Flatulence, eructation and gas pain 05/07/2020   Change in bowel habit 05/07/2020   Gout 06/22/2019   Post viral syndrome 06/15/2019   Cervicalgia 06/15/2019   Chronic tension-type headache, intractable 06/15/2019   Fall 04/21/2018   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   Diverticulosis 12/23/2017   History of adenomatous polyp of colon 12/23/2017   Recurrent falls 12/14/2017   Degenerative disc disease, cervical 12/14/2017   Chronic constipation 12/14/2017   Floaters in visual field, bilateral 08/10/2017   Head injury consultation 08/10/2017   Arthritis of carpometacarpal (CMC)  joint of right thumb 05/14/2017   Therapeutic opioid-induced constipation (OIC) 02/05/2017   BMI 29.0-29.9,adult 02/05/2017   Dry mouth 02/05/2017   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/08/2017   Intolerance of continuous positive airway pressure (CPAP) ventilation 01/08/2017   Benign neoplasm of colon 11/11/2016   Other long term (current) drug therapy 11/11/2016   History of total knee arthroplasty, 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   Cough 03/06/2016   At risk for injury related to fall 02/14/2016   Primary osteoarthritis of both hands 02/04/2016   Osteopenia of multiple sites 02/04/2016   Metatarsalgia of both feet 02/04/2016   Migraines 02/04/2016   Other fatigue 02/01/2016   Melena 08/20/2015   Acute recurrent maxillary sinusitis 08/20/2015   Slow  transit constipation 08/20/2015   Vitamin D  deficiency 08/20/2015   Thyroid  activity decreased 08/20/2015   Cephalalgia 08/20/2015   Preop cardiovascular exam 07/20/2015   Subacute ethmoidal sinusitis 12/11/2014   Chronic infection of sinus 07/12/2014   Nasal septal ulcer 05/25/2014   DJD (degenerative joint disease), cervical 03/22/2014   Right hand pain 03/22/2014   Deflected nasal septum 01/30/2014   Hypertrophy of nasal turbinates 01/30/2014   UARS (upper airway resistance syndrome) 10/04/2013   Insomnia secondary to depression with anxiety 10/04/2013   Exertional dyspnea 08/20/2013   History of rheumatic fever 08/20/2013   Hypomania (mild) single episode or unspecified 08/09/2013   OSA on CPAP 04/06/2013   Postoperative anemia due to acute blood loss 11/09/2012   OA (osteoarthritis) of knee 11/08/2012   History of giardia infection 07/15/2012   Atrophic vaginitis 07/15/2012   Vitreous degeneration of right eye    Nuclear sclerosis    Right shoulder pain 06/18/2011   Foot pain, left 06/18/2011   Hyperlipidemia 03/24/2011   Disorder of bone 02/12/2009   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   Migraine, unspecified, not intractable, without status migrainosus 02/12/2009    PCP: Garnette Ore MD  REFERRING PROVIDER: Helene Haddock MD  REFERRING DIAG:  [phone] (ICD-10-CM) - Acute pain of right knee  S32.010A (ICD-10-CM) - Closed compression fracture of body of L1 vertebra (HCC)    Rationale for Evaluation and Treatment: Rehabilitation  THERAPY DIAG:  Other low back pain  Chronic pain of right knee  Muscle weakness (generalized)  Unsteadiness on feet  ONSET DATE: March 2025  SUBJECTIVE:                                                                                                                                                                                           SUBJECTIVE STATEMENT: Fell in June. Have had  2 compression fx.    2  falls in one day early  march 1st compression fx then again in June sustaining a second.  Has a membership here at [facility]. I have been Walking in pool 4 x week for 45 mins to 1 hr. But I still hurt. I want to work on my balance and stand up from a chair without using my arms.  Standing up from a chair my right knee and back hurts. Using rollator when walking back to pool but not any other time  PERTINENT HISTORY:  Evaluate and treat for L1 compression fx and right knee pain. Include aquatic therapy in treatment plan.  Chronic fatigue; fibromyalgia  PAIN:  Are you having pain? Yes: NPRS scale: current 0/10; worst 7/10 Pain location: right knee and LB Pain description: sharp Aggravating factors: transfering STS; in and out of bed; in and out of car Relieving factors: rest  PRECAUTIONS: Fall  RED FLAGS: None   WEIGHT BEARING RESTRICTIONS: No  FALLS:  Has patient fallen in last 6 months? Yes. Number of falls 1 fall backwards after putting my foot on a chair to toe my shoe.  LIVING ENVIRONMENT: Lives with: lives with their spouse Lives in: House/apartment Has following equipment at home: Single point cane, Environmental consultant - 2 wheeled, Environmental consultant - 4 wheeled, and Grab bars  OCCUPATION: retired Financial controller  PLOF: Independent  PATIENT GOALS: Improve balance, get in and out of chairs, car and bed better  NEXT MD VISIT: as needed  OBJECTIVE:  Note: Objective measures were completed at Evaluation unless otherwise noted.  DIAGNOSTIC FINDINGS:  7/25 CT Lumbar IMPRESSION: 1. Subacute compression deformities at T12 and L1 with loss of height of 10% at each level. Sclerotic change consistent with ongoing healing process. I cannot state by CT if there is complete healing. No retropulsed bone. 2. L3-4: Mild disc bulge and facet osteoarthritis. No compressive stenosis. 3. L4-5: Mild disc bulge. Bilateral facet degeneration and hypertrophy. 1-2 mm of degenerative anterolisthesis. Mild narrowing of both lateral  recesses, not grossly compressive. 4. L5-S1: Endplate osteophytes and bulging of the disc. Bilateral facet osteoarthritis worse on the right. No compressive stenosis.  05/29/23 Xray R knee IMPRESSION: 1. 9.3 x 3.9 x 3.7 cm heterogenous layering hypodense fluid collection most compatible with a hematoma extending along the superficial fascia of the right posterolateral thigh, overlying and compressing the biceps femoris musculature, which could represent a soft tissue degloving injury, such as a Morel-Lavallee lesion. This collection appears to continue below the level of the knee into the lateral gastrocnemius muscle, concerning for intramuscular hematoma. 2. Intact right total knee arthroplasty. No acute osseous abnormality. 3. Small knee joint effusion.  PATIENT SURVEYS:  ODI  COGNITION: Overall cognitive status: Within functional limits for tasks assessed     SENSATION: WFL  MUSCLE LENGTH: Hamstrings: wfl   POSTURE: rounded shoulders, forward head, decreased thoracic kyphosis, and left pelvic obliquity  PALPATION: Right patella crepitus  LUMBAR ROM:   AROM eval  Flexion full  Extension   Right lateral flexion 90% limited P!  Left lateral flexion 90% limited P!  Right rotation   Left rotation    (Blank rows = not tested)  LOWER EXTREMITY ROM:     Active  Right eval Left eval  Hip flexion    Hip extension    Hip abduction    Hip adduction    Hip internal rotation    Hip external rotation    Knee flexion 110 128  Knee extension 0 0  Ankle dorsiflexion  Ankle plantarflexion    Ankle inversion    Ankle eversion     (Blank rows = not tested)  LOWER EXTREMITY MMT:    MMT Right eval Left eval  Hip flexion 3 3  Hip extension    Hip abduction 4+ 4+  Hip adduction 4- 4-  Hip internal rotation    Hip external rotation    Knee flexion 4 5  Knee extension 3+ 4  Ankle dorsiflexion    Ankle plantarflexion    Ankle inversion    Ankle eversion      (Blank rows = not tested)  LUMBAR SPECIAL TESTS:  Slump test: Negative  FUNCTIONAL TESTS:  30 seconds chair stand test Timed up and go (TUG): 21.24   Item Test date: 11/05/23 Date:  Date:   Sitting to standing 2. able to stand using hands after several tries Insert SmartPhrase OPRCBERGREEVAL Insert SmartPhrase OPRCBERGREEVAL  2. Standing unsupported 4. able to stand safely for 2 minutes    3. Sitting with back unsupported, feet supported 4. able to sit safely and securely for 2 minutes    4. Standing to sitting 2. uses back of legs against chair to control descent    5. Pivot transfer  3. able to transfer safely with definite need of hands    6. Standing unsupported with eyes closed 2. able to stand 3 seconds    7. Standing unsupported with feet together 1. needs help to attain position but able to stand 15 seconds feet together    8. Reaching forward with outstretched arms while standing 2. can reach forward 5 cm (2 inches)    9. Pick up object from the floor from standing 3. able to pick up slipper but needs supervision    10. Turning to look behind over left and right shoulders while standing 3. looks behind one side only, other side shows less weight shift    11. Turn 360 degrees 1. needs close supervision or verbal cuing    12. Place alternate foot on step or stool while standing unsupported 0. needs assistance to keep from falling/unable to try    13. Standing unsupported one foot in front 0. loses balance while stepping or standing    14. Standing on one leg 0. unable to try of needs assist to prevent fall      Total Score 27/56 Total Score:    Total Score:     GAIT: Distance walked: 400 ft Assistive device utilized: rollator Level of assistance: Complete Independence Comments: quickened short step length, reduced hip/knee flex, little arm swing  TREATMENT  Eval Self care:Posture and body mechanic instruction; grab bars for commode; shower. STS transfers from chairs in  restaurants; use of AD; safety precautions to avoid falls                                                                                                                             PATIENT EDUCATION:  Education  details: Discussed eval findings, rehab rationale, aquatic program progression/POC and pools in area. Patient is in agreement  Person educated: Patient Education method: Explanation Education comprehension: verbalized understanding  HOME EXERCISE PROGRAM: TBA  ASSESSMENT:  CLINICAL IMPRESSION: Patient is an 83 y.o. f who was seen today for physical therapy evaluation and treatment for back and right knee pain. PmHx includes bilater TKR >8-10 yrs ago and recent falls onto right knee sustaining T12 and L1 compression fx (earlier this year).  She is a member here at [facility] and comes 4 days a week and walks submerged x 45 -60 mins.   She presents with pain limited deficits in mid to lb and right knee  effecting strength, activity tolerance, gait, balance, and functional mobility with ADL's. Specifically she reports most difficulty with all transfers STS; sup to sit and getting out of car. Pt and cg given information on various grab bars for bed, commode and shower encouraging increased safety at home. As indicated by subjective information, and objective and outcome measure score, deficits identified are affecting and limiting overall functional mobility.    OBJECTIVE IMPAIRMENTS: Abnormal gait, decreased activity tolerance, decreased balance, decreased knowledge of use of DME, decreased mobility, difficulty walking, decreased ROM, decreased strength, postural dysfunction, obesity, and pain.   ACTIVITY LIMITATIONS: carrying, lifting, bending, sitting, standing, squatting, stairs, and transfers  PARTICIPATION LIMITATIONS: meal prep, cleaning, laundry, driving, shopping, and community activity  PERSONAL FACTORS: Age and 1-2 comorbidities: see PmHx are also affecting patient's functional  outcome.   REHAB POTENTIAL: Good  CLINICAL DECISION MAKING: Evolving/moderate complexity  EVALUATION COMPLEXITY: Moderate   GOALS: Goals reviewed with patient? Yes  SHORT TERM GOALS: Target date: 12/14/23 (delay in beginning therapy)  Pt will tolerate full aquatic sessions consistently without increase in pain and with improving function to demonstrate good toleration and effectiveness of intervention.  Baseline: Goal status: INITIAL  2.  Pt will tolerate stair climbing in and out of pool using approp pattern ascending and descending 6 steps with use of handrail  Baseline:  Goal status: INITIAL  3.  Pt will perform 10 STS from water  bench onto water  step unsupported without LOB Baseline:  Goal status: INITIAL  4.  Pt and cg will consider grab bars for commode  Baseline:  Goal status: INITIAL  5.  Pt will be indep and compliant with initial land based HEP for improving strength and safety at home Baseline:  Goal status: INITIAL    LONG TERM GOALS: Target date: 01/15/24  Pt to improve on ODI by 13% to demonstrate statistically significant Improvement in function. (MCID 13-15%) Baseline: 23/50=46% Goal status: INITIAL  2.  Pt will improve strength in bil hip flex and right knee ext by 1 grade to demonstrate improved overall physical function Baseline: see chart Goal status: INITIAL  3.  Pt will improve on Berg balance test to >/= 35/56 to demonstrate a decrease in fall risk. (MCD 7) Baseline:27/56 (medium fall risk)  Goal status: INITIAL  4.  Pt will improve on 30s STS test  from standard chair from 3 to 6 to demonstrate improving functional lower extremity strength, transitional movements, and balance. (MDC = 4.2sec)  Baseline: 3 Goal status: INITIAL  5.  Pt will report decrease in pain by at least 50% for improved toleration to activity/quality of life and to demonstrate improved management of pain. Baseline: see chart Goal status: INITIAL  6.  Pt will be  indep with final HEP's (land and aquatic as appropriate) for continued management of  condition Baseline: see chart Goal status: INITIAL  PLAN:  PT FREQUENCY: 2x/week  PT DURATION: 10 w(extended out due to scheduling conflicts) Initial 8 sessions aquatic with 1 Land based for instruction on land based initial HEP. Then last 7-8 alternate  PLANNED INTERVENTIONS: 97164- PT Re-evaluation, 97750- Physical Performance Testing, 97110-Therapeutic exercises, 97530- Therapeutic activity, [id number]- Neuromuscular re-education, 97535- Self Care, [id number]- Manual therapy, [phone]- Gait training, [phone]- Aquatic Therapy, [phone]- Ionotophoresis 4mg /ml Dexamethasone , 79439 (1-2 muscles), 20561 (3+ muscles)- Dry Needling, Patient/Family education, Balance training, Stair training, Taping, Joint mobilization, DME instructions, Cryotherapy, and Moist heat.  PLAN FOR NEXT SESSION: aquatic: core and le strengthening, transfers, stair climbing, balance retraining Land initially HEP focused on hip and knee strength, balance, endurance training, gait training   Ronal Foots) Ervie Mccard MPT 11/05/23 3:22 PM [facility] [address] [place], [place], [address] Phone: [phone]   Fax:  [phone]

## 2023-11-05 ENCOUNTER — Ambulatory Visit (HOSPITAL_BASED_OUTPATIENT_CLINIC_OR_DEPARTMENT_OTHER): Attending: Sports Medicine | Admitting: Physical Therapy

## 2023-11-05 ENCOUNTER — Other Ambulatory Visit: Payer: Self-pay

## 2023-11-05 ENCOUNTER — Encounter (HOSPITAL_BASED_OUTPATIENT_CLINIC_OR_DEPARTMENT_OTHER): Payer: Self-pay | Admitting: Physical Therapy

## 2023-11-05 DIAGNOSIS — M5459 Other low back pain: Secondary | ICD-10-CM | POA: Insufficient documentation

## 2023-11-05 DIAGNOSIS — S32010A Wedge compression fracture of first lumbar vertebra, initial encounter for closed fracture: Secondary | ICD-10-CM | POA: Diagnosis not present

## 2023-11-05 DIAGNOSIS — R2681 Unsteadiness on feet: Secondary | ICD-10-CM | POA: Diagnosis present

## 2023-11-05 DIAGNOSIS — M25561 Pain in right knee: Secondary | ICD-10-CM | POA: Insufficient documentation

## 2023-11-05 DIAGNOSIS — G8929 Other chronic pain: Secondary | ICD-10-CM | POA: Insufficient documentation

## 2023-11-05 DIAGNOSIS — M6281 Muscle weakness (generalized): Secondary | ICD-10-CM | POA: Insufficient documentation

## 2023-11-09 ENCOUNTER — Other Ambulatory Visit (HOSPITAL_BASED_OUTPATIENT_CLINIC_OR_DEPARTMENT_OTHER): Payer: Self-pay

## 2023-11-09 ENCOUNTER — Ambulatory Visit (HOSPITAL_BASED_OUTPATIENT_CLINIC_OR_DEPARTMENT_OTHER): Admitting: Physical Therapy

## 2023-11-09 ENCOUNTER — Encounter (HOSPITAL_BASED_OUTPATIENT_CLINIC_OR_DEPARTMENT_OTHER): Payer: Self-pay | Admitting: Physical Therapy

## 2023-11-09 DIAGNOSIS — M6281 Muscle weakness (generalized): Secondary | ICD-10-CM

## 2023-11-09 DIAGNOSIS — M5459 Other low back pain: Secondary | ICD-10-CM

## 2023-11-09 DIAGNOSIS — G8929 Other chronic pain: Secondary | ICD-10-CM

## 2023-11-09 DIAGNOSIS — R2681 Unsteadiness on feet: Secondary | ICD-10-CM

## 2023-11-09 MED ORDER — COMIRNATY 30 MCG/0.3ML IM SUSY
0.3000 mL | PREFILLED_SYRINGE | Freq: Once | INTRAMUSCULAR | 0 refills | Status: AC
  Filled 2023-11-09: qty 0.3, 1d supply, fill #0

## 2023-11-09 NOTE — Therapy (Signed)
 OUTPATIENT PHYSICAL THERAPY THORACOLUMBAR EVALUATION   Patient Name: [name] MRN: [id number] DOB:Jul 18, 1940, 83 y.o., female Today's Date: 11/09/2023  END OF SESSION:  PT End of Session - 11/09/23 0927     Visit Number 2    Number of Visits 16    Date for PT Re-Evaluation 01/15/24    Authorization Type hulan Mcr    PT Start Time [phone]    PT Stop Time 0926    PT Time Calculation (min) 39 min    Activity Tolerance Patient tolerated treatment well    Behavior During Therapy [facility] for tasks assessed/performed           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   Hyperlipidemia    Impaired hearing    left ear, wears hearing aids   Insomnia    circadian rhythm component   Insomnia    Migraine 11/1986   Spillman   Nuclear sclerosis    OSA on CPAP 03/2005   uses cpap setting of 10   Osteoarthritis 2007   Deveschwar   Pain last week   left under breast pain    Plantar fasciitis    Rheumatic fever    Scarlet fever as child   Sleep apnea    Swallowing difficulty    Thyroid  disorder    Vitamin D  deficiency    Vitreous degeneration of right eye    Past Surgical History:  Procedure Laterality Date   BREAST BIOPSY Left 6/00   Hardcastle   BREAST EXCISIONAL BIOPSY Left 1998   DE QUERVAIN'S RELEASE Right 10/97   Sypher   left breast biopsy     ROOT CANAL  02/11/2012   TONSILLECTOMY  age 46   TONSILLECTOMY AND ADENOIDECTOMY  1952   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   TOTAL KNEE ARTHROPLASTY Right 11/08/2012   Procedure: RIGHT TOTAL KNEE ARTHROPLASTY;  Surgeon: Brenda LULLA Moan, MD;  Location: [facility];  Service: Orthopedics;  Laterality: Right;   TUBAL LIGATION     Patient Active Problem List   Diagnosis Date Noted   Venous  stasis of lower extremity 07/28/2023   Great toe pain, right 06/16/2023   Acute pain of right knee 05/28/2023   Closed compression fracture of body of first lumbar vertebra (HCC) 05/28/2023   Bilateral lower extremity edema 02/03/2022   Fluid retention 12/17/2021   Low back pain 10/03/2021   Atherosclerotic heart disease of native coronary artery without angina pectoris 05/27/2021   Diastolic dysfunction 05/27/2021   Neck pain 05/27/2021   Migraine 04/23/2021   Generalized obesity 04/23/2021   Depression 03/18/2021   Lumbar spine pain 08/30/2020   Flatulence, eructation and gas pain 05/07/2020   Change in bowel habit 05/07/2020   Gout 06/22/2019   Post viral syndrome 06/15/2019   Cervicalgia 06/15/2019   Chronic tension-type headache, intractable 06/15/2019   Fall 04/21/2018   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   Diverticulosis 12/23/2017   History of adenomatous polyp of colon 12/23/2017   Recurrent falls 12/14/2017   Degenerative disc disease, cervical 12/14/2017   Chronic constipation 12/14/2017   Floaters in visual field, bilateral 08/10/2017   Head injury consultation 08/10/2017   Arthritis of carpometacarpal (  CMC) joint of right thumb 05/14/2017   Therapeutic opioid-induced constipation (OIC) 02/05/2017   BMI 29.0-29.9,adult 02/05/2017   Dry mouth 02/05/2017   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/08/2017   Intolerance of continuous positive airway pressure (CPAP) ventilation 01/08/2017   Benign neoplasm of colon 11/11/2016   Other long term (current) drug therapy 11/11/2016   History of total knee arthroplasty, 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   Cough 03/06/2016   At risk for injury related to fall 02/14/2016   Primary osteoarthritis of both hands 02/04/2016   Osteopenia of multiple sites 02/04/2016   Metatarsalgia of both feet 02/04/2016   Migraines 02/04/2016   Other fatigue 02/01/2016   Melena 08/20/2015   Acute recurrent maxillary sinusitis 08/20/2015   Slow  transit constipation 08/20/2015   Vitamin D  deficiency 08/20/2015   Thyroid  activity decreased 08/20/2015   Cephalalgia 08/20/2015   Preop cardiovascular exam 07/20/2015   Subacute ethmoidal sinusitis 12/11/2014   Chronic infection of sinus 07/12/2014   Nasal septal ulcer 05/25/2014   DJD (degenerative joint disease), cervical 03/22/2014   Right hand pain 03/22/2014   Deflected nasal septum 01/30/2014   Hypertrophy of nasal turbinates 01/30/2014   UARS (upper airway resistance syndrome) 10/04/2013   Insomnia secondary to depression with anxiety 10/04/2013   Exertional dyspnea 08/20/2013   History of rheumatic fever 08/20/2013   Hypomania (mild) single episode or unspecified 08/09/2013   OSA on CPAP 04/06/2013   Postoperative anemia due to acute blood loss 11/09/2012   OA (osteoarthritis) of knee 11/08/2012   History of giardia infection 07/15/2012   Atrophic vaginitis 07/15/2012   Vitreous degeneration of right eye    Nuclear sclerosis    Right shoulder pain 06/18/2011   Foot pain, left 06/18/2011   Hyperlipidemia 03/24/2011   Disorder of bone 02/12/2009   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   Migraine, unspecified, not intractable, without status migrainosus 02/12/2009    PCP: Brenda Ore MD  REFERRING PROVIDER: Helene Haddock MD  REFERRING DIAG:  [phone] (ICD-10-CM) - Acute pain of right knee  S32.010A (ICD-10-CM) - Closed compression fracture of body of L1 vertebra (HCC)    Rationale for Evaluation and Treatment: Rehabilitation  THERAPY DIAG:  Other low back pain  Muscle weakness (generalized)  Chronic pain of right knee  Unsteadiness on feet  ONSET DATE: March 2025  SUBJECTIVE:                                                                                                                                                                                           SUBJECTIVE STATEMENT:  Pt reports waking up first thing in the morning feels good.  Pt did  take tramadol  for pain this morning.   EvalBETHA Green in June. Have had  2 compression fx.    2 falls in one day early march 1st compression fx then again in June sustaining a second.  Has a membership here at [facility]. I have been Walking in pool 4 x week for 45 mins to 1 hr. But I still hurt. I want to work on my balance and stand up from a chair without using my arms.  Standing up from a chair my right knee and back hurts. Using rollator when walking back to pool but not any other time  PERTINENT HISTORY:  Evaluate and treat for L1 compression fx and right knee pain. Include aquatic therapy in treatment plan.  Chronic fatigue; fibromyalgia  PAIN:  Are you having pain? Yes: NPRS scale: current 0/10; worst 7/10 Pain location: right knee and LB Pain description: sharp Aggravating factors: transfering STS; in and out of bed; in and out of car Relieving factors: rest  PRECAUTIONS: Fall  RED FLAGS: None   WEIGHT BEARING RESTRICTIONS: No  FALLS:  Has patient fallen in last 6 months? Yes. Number of falls 1 fall backwards after putting my foot on a chair to toe my shoe.  LIVING ENVIRONMENT: Lives with: lives with their spouse Lives in: House/apartment Has following equipment at home: Single point cane, Environmental consultant - 2 wheeled, Environmental consultant - 4 wheeled, and Grab bars  OCCUPATION: retired Financial controller  PLOF: Independent  PATIENT GOALS: Improve balance, get in and out of chairs, car and bed better  NEXT MD VISIT: as needed  OBJECTIVE:  Note: Objective measures were completed at Evaluation unless otherwise noted.  DIAGNOSTIC FINDINGS:  7/25 CT Lumbar IMPRESSION: 1. Subacute compression deformities at T12 and L1 with loss of height of 10% at each level. Sclerotic change consistent with ongoing healing process. I cannot state by CT if there is complete healing. No retropulsed bone. 2. L3-4: Mild disc bulge and facet osteoarthritis. No compressive stenosis. 3. L4-5: Mild disc bulge.  Bilateral facet degeneration and hypertrophy. 1-2 mm of degenerative anterolisthesis. Mild narrowing of both lateral recesses, not grossly compressive. 4. L5-S1: Endplate osteophytes and bulging of the disc. Bilateral facet osteoarthritis worse on the right. No compressive stenosis.  05/29/23 Xray R knee IMPRESSION: 1. 9.3 x 3.9 x 3.7 cm heterogenous layering hypodense fluid collection most compatible with a hematoma extending along the superficial fascia of the right posterolateral thigh, overlying and compressing the biceps femoris musculature, which could represent a soft tissue degloving injury, such as a Morel-Lavallee lesion. This collection appears to continue below the level of the knee into the lateral gastrocnemius muscle, concerning for intramuscular hematoma. 2. Intact right total knee arthroplasty. No acute osseous abnormality. 3. Small knee joint effusion.  PATIENT SURVEYS:  ODI  COGNITION: Overall cognitive status: Within functional limits for tasks assessed     SENSATION: WFL  MUSCLE LENGTH: Hamstrings: wfl   POSTURE: rounded shoulders, forward head, decreased thoracic kyphosis, and left pelvic obliquity  PALPATION: Right patella crepitus  LUMBAR ROM:   AROM eval  Flexion full  Extension   Right lateral flexion 90% limited P!  Left lateral flexion 90% limited P!  Right rotation   Left rotation    (Blank rows = not tested)  LOWER EXTREMITY ROM:     Active  Right eval Left eval  Hip flexion    Hip extension    Hip abduction    Hip adduction    Hip  internal rotation    Hip external rotation    Knee flexion 110 128  Knee extension 0 0  Ankle dorsiflexion    Ankle plantarflexion    Ankle inversion    Ankle eversion     (Blank rows = not tested)  LOWER EXTREMITY MMT:    MMT Right eval Left eval  Hip flexion 3 3  Hip extension    Hip abduction 4+ 4+  Hip adduction 4- 4-  Hip internal rotation    Hip external rotation    Knee  flexion 4 5  Knee extension 3+ 4  Ankle dorsiflexion    Ankle plantarflexion    Ankle inversion    Ankle eversion     (Blank rows = not tested)  LUMBAR SPECIAL TESTS:  Slump test: Negative  FUNCTIONAL TESTS:  30 seconds chair stand test Timed up and go (TUG): 21.24   Item Test date: 11/05/23 Date:  Date:   Sitting to standing 2. able to stand using hands after several tries Insert SmartPhrase OPRCBERGREEVAL Insert SmartPhrase OPRCBERGREEVAL  2. Standing unsupported 4. able to stand safely for 2 minutes    3. Sitting with back /unsupported, feet supported 4. able to sit safely and securely for 2 minutes    4. Standing to sitting 2. uses back of legs against chair to control descent    5. Pivot transfer  3. able to transfer safely with definite need of hands    6. Standing unsupported with eyes closed 2. able to stand 3 seconds    7. Standing unsupported with feet together 1. needs help to attain position but able to stand 15 seconds feet together    8. Reaching forward with outstretched arms while standing 2. can reach forward 5 cm (2 inches)    9. Pick up object from the floor from standing 3. able to pick up slipper but needs supervision    10. Turning to look behind over left and right shoulders while standing 3. looks behind one side only, other side shows less weight shift    11. Turn 360 degrees 1. needs close supervision or verbal cuing    12. Place alternate foot on step or stool while standing unsupported 0. needs assistance to keep from falling/unable to try    13. Standing unsupported one foot in front 0. loses balance while stepping or standing    14. Standing on one leg 0. unable to try of needs assist to prevent fall      Total Score 27/56 Total Score:    Total Score:     GAIT: Distance walked: 400 ft Assistive device utilized: rollator Level of assistance: Complete Independence Comments: quickened short step length, reduced hip/knee flex, little arm swing  TREATMENT    9/15  Supine PPT 2x10 2s PPT with bridge 2x10 LTR 20x Supine piriformis 30s 3x each STS from elevated table 3x8  Daily walking program of at least 10 min per day   Eval Self care:Posture and body mechanic instruction; grab bars for commode; shower. STS transfers from chairs in restaurants; use of AD; safety precautions to avoid falls  PATIENT EDUCATION:  Education details: anatomy, exercise progression, DOMS expectations, muscle firing,  envelope of function, HEP, POC  Person educated: Patient Education method: Explanation Education comprehension: verbalized understanding  HOME EXERCISE PROGRAM: Access Code: [email or web address] URL: [email or web address] Date: 11/09/2023 Prepared by: Dale Call  ASSESSMENT:  CLINICAL IMPRESSION:  Pt able to start land based session with lumbar mobility and motor control type exercise without increasing pain. Pt reports relief of discomfort with exercise and stretching by end of session. Pt to have aquatic session at next. Land based [employer]. Plan to continue with progressive land based lumbopelvic mobility, strength, and balance at future sessions with goal of weaning to LRAD as pt is still ambulating with RW at this time. Pt would benefit from continued skilled therapy in order to reach goals and maximize functional lumbopelvic strength and ROM for return to normalized ADL and community ambulation.     Eval: Patient is an 83 y.o. f who was seen today for physical therapy evaluation and treatment for back and right knee pain. PmHx includes bilater TKR >8-10 yrs ago and recent falls onto right knee sustaining T12 and L1 compression fx (earlier this year).  She is a member here at [facility] and comes 4 days a week and walks submerged x 45 -60 mins.   She presents with pain limited deficits in mid to lb and right knee   effecting strength, activity tolerance, gait, balance, and functional mobility with ADL's. Specifically she reports most difficulty with all transfers STS; sup to sit and getting out of car. Pt and cg given information on various grab bars for bed, commode and shower encouraging increased safety at home. As indicated by subjective information, and objective and outcome measure score, deficits identified are affecting and limiting overall functional mobility.    OBJECTIVE IMPAIRMENTS: Abnormal gait, decreased activity tolerance, decreased balance, decreased knowledge of use of DME, decreased mobility, difficulty walking, decreased ROM, decreased strength, postural dysfunction, obesity, and pain.   ACTIVITY LIMITATIONS: carrying, lifting, bending, sitting, standing, squatting, stairs, and transfers  PARTICIPATION LIMITATIONS: meal prep, cleaning, laundry, driving, shopping, and community activity  PERSONAL FACTORS: Age and 1-2 comorbidities: see PmHx are also affecting patient's functional outcome.   REHAB POTENTIAL: Good  CLINICAL DECISION MAKING: Evolving/moderate complexity  EVALUATION COMPLEXITY: Moderate   GOALS: Goals reviewed with patient? Yes  SHORT TERM GOALS: Target date: 12/14/23 (delay in beginning therapy)  Pt will tolerate full aquatic sessions consistently without increase in pain and with improving function to demonstrate good toleration and effectiveness of intervention.  Baseline: Goal status: INITIAL  2.  Pt will tolerate stair climbing in and out of pool using approp pattern ascending and descending 6 steps with use of handrail  Baseline:  Goal status: INITIAL  3.  Pt will perform 10 STS from water  bench onto water  step unsupported without LOB Baseline:  Goal status: INITIAL  4.  Pt and cg will consider grab bars for commode  Baseline:  Goal status: INITIAL  5.  Pt will be indep and compliant with initial land based HEP for improving strength and safety at  home Baseline:  Goal status: INITIAL    LONG TERM GOALS: Target date: 01/15/24  Pt to improve on ODI by 13% to demonstrate statistically significant Improvement in function. (MCID 13-15%) Baseline: 23/50=46% Goal status: INITIAL  2.  Pt will improve strength in bil hip flex and right knee ext by 1 grade to demonstrate improved overall physical function Baseline: see chart Goal status: INITIAL  3.  Pt will improve on Berg balance test to >/= 35/56 to demonstrate a decrease in fall risk. (MCD 7) Baseline:27/56 (medium fall risk)  Goal status: INITIAL  4.  Pt will improve on 30s STS test  from standard chair from 3 to 6 to demonstrate improving functional lower extremity strength, transitional movements, and balance. (MDC = 4.2sec)  Baseline: 3 Goal status: INITIAL  5.  Pt will report decrease in pain by at least 50% for improved toleration to activity/quality of life and to demonstrate improved management of pain. Baseline: see chart Goal status: INITIAL  6.  Pt will be indep with final HEP's (land and aquatic as appropriate) for continued management of condition Baseline: see chart Goal status: INITIAL  PLAN:  PT FREQUENCY: 2x/week  PT DURATION: 10 w(extended out due to scheduling conflicts) Initial 8 sessions aquatic with 1 Land based for instruction on land based initial HEP. Then last 7-8 alternate  PLANNED INTERVENTIONS: 97164- PT Re-evaluation, 97750- Physical Performance Testing, 97110-Therapeutic exercises, 97530- Therapeutic activity, [id number]- Neuromuscular re-education, 97535- Self Care, [id number]- Manual therapy, [phone]- Gait training, [phone]- Aquatic Therapy, [phone]- Ionotophoresis 4mg /ml Dexamethasone , 79439 (1-2 muscles), [id number] (3+ muscles)- Dry Needling, Patient/Family education, Balance training, Stair training, Taping, Joint mobilization, DME instructions, Cryotherapy, and Moist heat.  PLAN FOR NEXT SESSION: aquatic: core and le strengthening, transfers, stair climbing,  balance retraining Land initially HEP focused on hip and knee strength, balance, endurance training, gait training    Dale Call PT, DPT 11/09/23 9:29 AM

## 2023-11-12 ENCOUNTER — Other Ambulatory Visit: Payer: Self-pay

## 2023-11-12 ENCOUNTER — Other Ambulatory Visit (HOSPITAL_COMMUNITY): Payer: Self-pay

## 2023-11-12 NOTE — Progress Notes (Signed)
 Patient called today asking for delivery of new medication, Tymlos . Patient was taught how to use medication in office.    Medication will be filled on 11/16/23 and delivered on 11/17/23 to address:  [address], [place] [address]  Pt aware of copay for 3 month supply. Patient is picking up pen needles from local pharmacy.

## 2023-11-13 ENCOUNTER — Other Ambulatory Visit: Payer: Self-pay

## 2023-11-16 ENCOUNTER — Ambulatory Visit (INDEPENDENT_AMBULATORY_CARE_PROVIDER_SITE_OTHER): Admitting: Neurology

## 2023-11-16 ENCOUNTER — Encounter: Payer: Self-pay | Admitting: Neurology

## 2023-11-16 ENCOUNTER — Other Ambulatory Visit: Payer: Self-pay

## 2023-11-16 VITALS — BP 112/64 | HR 68

## 2023-11-16 DIAGNOSIS — G4733 Obstructive sleep apnea (adult) (pediatric): Secondary | ICD-10-CM | POA: Diagnosis not present

## 2023-11-16 DIAGNOSIS — R2689 Other abnormalities of gait and mobility: Secondary | ICD-10-CM | POA: Diagnosis not present

## 2023-11-16 DIAGNOSIS — G608 Other hereditary and idiopathic neuropathies: Secondary | ICD-10-CM | POA: Diagnosis not present

## 2023-11-16 MED ORDER — ALPHA LIPOIC ACID-BIOTIN ER 600-450 MG-MCG PO TBCR: EXTENDED_RELEASE_TABLET | ORAL | 3 refills | Status: AC

## 2023-11-16 NOTE — Progress Notes (Signed)
 Provider:  Dedra Gores, MD  Primary Care Physician:  Nichole Senior, MD [address] [place] [address]     Referring Provider: Nichole Senior, Md [address] [place],  [place] [address]          Chief Complaint according to patient   Patient presents with:          ESS at 3/ 15 m FSS at 51/ 63 . Had 3 falls.        HISTORY OF PRESENT ILLNESS:  [name] is a 83 y.o. female patient who is here for revisit 11/16/2023 for  CPAP compliance. Gomm arrived today with her walker just in time for her appointment,  She fell 3 times since last being seen here and fractured her L2 vertebra and injured her arm ( march 2025) , and she lost 2 inches height. The second fall happened , again backwards,  when she lost balance after placing her foot on a chair to tie the laces.  She dislocated her finger.  Her third fall  was on June 13 th,  again backwards- she is wondering how the retropulsion come about. She is followed by Dr. Tasia for bipolar disorder and she is a chronic insomnia sufferer.   Here for me today on 11-16-2023,  CPAP for OSA: Highly compliant with CPAP excellent resolution of apnea.  The couple is looking at [facility] for residence.     Chief concern according to patient : I have fallen three times. My sleep is fine.    [name] is a 83 y.o. 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  She has currently no  migraines- she is headache free for several months, and that on Doxepine - which can affect STM.    Chief concern according to patient : Mrs. Roberson is here today for a yearly revisit her fatigue severity score is actually showing a well-controlled fatigue at 34 out of 63 points she has no excessive daytime sleepiness, her Epworth score was endorsed at 2 out of 24 points and her depression score is 6 out of 15 points so there is some depression but it is not a major depressive episode at  this time.  She continues to use her CPAP.  She continues to have some chronic pain and is prescribed tramadol  by sports medicine but also uses meloxicam for acute pain       [name] is a 83 y.o. female patient who is here for revisit 05/05/2022 for new CPAP first visit.   Chief concern according to patient :  new CPAP, to be seen within 30-90 days of issuing the new machine . Insurance requires we follow up in a visit within 31-90 days. If you got the machine 12/28 then we must see you in a follow up visit anywhere between 03/24/22-05/22/2022.    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 That is also a risk with OP medicines  and she does not wish to take that chance   Biggest issue is  getting strength and balance better She realizes this and is gradually increasing after her recent injuries   PE Elderly F in NAD Ht 5' 6 (1.676 m)   Wt 190 lb (86.2 kg)   LMP 02/25/1995 (Approximate)   BMI 30.67 kg/m    Gait assessment - has too much horizontal and AP sway of trunk with normal walking Gait is slow and hesitant 18 yard walk and turn - 24 secs fast At normal walk > 30 secs   Sit to stand is difficult without hands   Difficulty with 1 foot stand even when using hand for balance   Lateral walking able to do 5 steps RT and LT with no balance issues      Assessment & Plan Note by Harvey Seltzer, MD at 10/22/2023 6:35 PM  Author: Harvey Seltzer, MD Author Type: Physician Filed: 10/22/2023  6:35 PM  Note Status: Written Cosign: Cosign Not Required Encounter Date: 10/22/2023  Problem: Recurrent falls  Editor: Harvey Seltzer,  MD (Physician)             She is high risk for falls and that is key need for TX She is hesitant to take OP meds with good reasons   Plan Aquatic PT Home exercises for quad strength Hip flexor strength   Sit to stand 1 foot balance   Once formal PT ends we will need to get her into a maintenance program as she currenlty is high risk for falls.           Review of Systems: Out of a complete 14 system review, the patient complains of only the following symptoms, and all other reviewed systems are negative.:   SLEEPINESS ?  How likely are you to doze in the following situations: 0 = not likely, 1 = slight chance, 2 = moderate chance, 3 = high chance  Sitting and Reading? Watching Television? Sitting inactive in a public place (theater or meeting)? Lying down in the afternoon when circumstances permit? Sitting and talking to someone? Sitting quietly after lunch without alcohol? In a car, while stopped for a few minutes in traffic? As a passenger in a car for an hour without a break?  Total =  3/ 24 FSS at 51/ 63 .    Social History   Socioeconomic History   Marital status: Married    Spouse name: Ubaldo   Number of children: 0   Years of education: 14   Highest education level: Not on file  Occupational History   Occupation: Retired Financial controller  Tobacco Use   Smoking status: Never    Passive exposure: Yes   Smokeless tobacco: Never   Tobacco comments:    flight attendant on smoking plane   Vaping Use   Vaping status: Never Used  Substance and Sexual Activity   Alcohol use: Not Currently    Comment: 1 glass - occas of vodka   Drug use: No   Sexual activity: Not Currently    Partners: Male    Birth control/protection: Post-menopausal  Other Topics Concern   Not on file  Social History Narrative   Patient is married Fidela).   Patient drinks 2-4 cups of coffee daily.   Patient is retired.   Patient has two years of college.   Patient is  right-handed.               Social Drivers of Corporate investment banker Strain: Low Risk  (10/28/2017)   Overall Physicist, medical Strain (  CARDIA)    Difficulty of Paying Living Expenses: Not hard at all  Food Insecurity: No Food Insecurity (10/28/2017)   Hunger Vital Sign    Worried About Running Out of Food in the Last Year: Never true    Ran Out of Food in the Last Year: Never true  Transportation Needs: No Transportation Needs (10/28/2017)   PRAPARE - Administrator, Civil Service (Medical): No    Lack of Transportation (Non-Medical): No  Physical Activity: Sufficiently Active (10/28/2017)   Exercise Vital Sign    Days of Exercise per Week: 4 days    Minutes of Exercise per Session: 60 min  Stress: No Stress Concern Present (10/28/2017)   [employer] of Occupational Health - Occupational Stress Questionnaire    Feeling of Stress : Only a little  Social Connections: Moderately Integrated (10/28/2017)   Social Connection and Isolation Panel    Frequency of Communication with Friends and Family: More than three times a week    Frequency of Social Gatherings with Friends and Family: More than three times a week    Attends Religious Services: More than 4 times per year    Active Member of Clubs or Organizations: No    Attends Banker Meetings: Never    Marital Status: Married    Family History  Problem Relation Age of Onset   Cancer Mother    Thyroid  disease Mother    Hypertension Mother    Breast cancer Mother    Aneurysm Father    Migraines Father    High blood pressure Brother    Melanoma Brother    Kidney cancer Brother        Lesion on kidney   Arrhythmia Brother    Heart attack Paternal Grandmother    Heart attack Paternal Grandfather    Breast cancer Maternal Aunt    Breast cancer Maternal Aunt    Breast cancer Maternal Aunt    Breast cancer Maternal Aunt    Heart disease Other        Grandmother    Past Medical History:   Diagnosis Date   Anxiety    Cancer (HCC)    basal cell skin biopsies X2   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   Chronic fatigue    Constipation    Depression    Depression    Fibromyalgia 09/1996   Tr[place]low   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   Hyperlipidemia    Impaired hearing    left ear, wears hearing aids   Insomnia    circadian rhythm component   Insomnia    Migraine 11/1986   Spillman   Nuclear sclerosis    OSA on CPAP 03/2005   uses cpap setting of 10   Osteoarthritis 2007   Deveschwar   Pain last week   left under breast pain    Plantar fasciitis    Rheumatic fever    Scarlet fever as child   Sleep apnea    Swallowing difficulty    Thyroid  disorder    Vitamin D  deficiency    Vitreous degeneration of right eye     Past Surgical History:  Procedure Laterality Date   BREAST BIOPSY Left 6/00   [place]   BREAST EXCISIONAL BIOPSY Left 1998   DE QUERVAIN'S RELEASE Right 10/97   Sypher   left breast biopsy     ROOT CANAL  02/11/2012   TONSILLECTOMY  age 89   TONSILLECTOMY AND ADENOIDECTOMY  1952  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 4.68 mL 3   aspirin EC 81 MG tablet Take 81 mg by mouth daily.     azelastine (ASTELIN) 0.1 % nasal spray Place into the nose.     doxepin  (SINEQUAN ) 100 MG capsule Take 1 capsule (100 mg total) by mouth at bedtime. 90 capsule 1   doxepin  (SINEQUAN ) 50 MG capsule Take 1 capsule (50 mg) twice daily. Take 2 (100 mg) capsules at night. 360 capsule 1   Eszopiclone  3 MG TABS Take 3 mg by mouth at bedtime. Take immediately before bedtime     fluticasone  (FLONASE) 50 MCG/ACT nasal spray      hydrochlorothiazide (MICROZIDE) 12.5 MG capsule Take 12.5 mg by mouth daily.     ibuprofen  (ADVIL ) 600 MG tablet Take 1 tablet (600 mg total) by mouth every 6 (six) hours as needed for up to 30 doses for mild pain (pain score 1-3) or moderate pain (pain score 4-6). 30 tablet 0   Insulin  Pen Needle (ASSURE ID DUO PRO PEN NEEDLES) 31G X 5 MM MISC 1 each by Does not apply route daily. 30 each 24   levothyroxine (SYNTHROID, LEVOTHROID) 200 MCG tablet Take 200 mcg by mouth daily before breakfast.     LORazepam  (ATIVAN ) 1 MG tablet Take 1 tablet (1 mg total) by mouth 2 (two) times daily. 30 tablet 1   melatonin 3 MG TABS tablet Take 20 mg by mouth.     montelukast (SINGULAIR) 10 MG tablet TAKE 1 TABLET BY MOUTH EVERY DAY 90 tablet 3   omeprazole (PRILOSEC) 40 MG capsule Take 1 capsule by mouth daily.     polyethylene glycol (MIRALAX  / GLYCOLAX ) packet Take 17 g by mouth daily.     traMADol  (ULTRAM ) 50 MG tablet Take 2 tablets (100 mg total) by mouth in the morning, at noon, and at bedtime. 180 tablet 2   UBRELVY 50 MG TABS Take by mouth as needed.     Vitamin D , Ergocalciferol , (DRISDOL ) 1.25 MG (50000 UNIT) CAPS capsule Take 1 capsule (50,000 Units total) by mouth every 7 (seven) days. 13 capsule 0   No current facility-administered medications on file prior to visit.    Allergies  Allergen Reactions   Penicillins Anaphylaxis    Tongue swelling   Codeine Nausea Only   Oxycodone -Acetaminophen     Penicillamine    Provigil [Modafinil]    Seroquel [Quetiapine Fumarate]    Xyrem [Oxybate]     hallucination   Ciprofloxacin Rash   Monistat [Miconazole] Rash   Terazol [Terconazole] Rash     DIAGNOSTIC DATA (LABS, IMAGING, TESTING) - I reviewed patient records, labs, notes, testing and imaging myself where available.  Lab Results  Component Value Date   WBC 7.0 08/31/2023   HGB 14.0 08/31/2023   HCT 43.7 08/31/2023   MCV 89 08/31/2023   PLT 260  08/31/2023      Component Value Date/Time   NA 139 08/31/2023 1640   K 4.0 08/31/2023 1640   CL 100 08/31/2023 1640   CO2 24 08/31/2023 1640   GLUCOSE 115 (H) 08/31/2023 1640  GLUCOSE 104 (H) 02/11/2017 0846   BUN 23 08/31/2023 1640   CREATININE 0.63 08/31/2023 1640   CREATININE 0.56 (L) 02/11/2017 0846   CALCIUM 9.4 08/31/2023 1640   PROT 6.3 08/31/2023 1640   ALBUMIN 4.1 08/31/2023 1640   AST 15 08/31/2023 1640   ALT 13 08/31/2023 1640   ALKPHOS 112 08/31/2023 1640   BILITOT <0.2 08/31/2023 1640   GFRNONAA 89 08/05/2019 1536   GFRNONAA 91 02/11/2017 0846   GFRAA 103 08/05/2019 1536   GFRAA 105 02/11/2017 0846   Lab Results  Component Value Date   CHOL 170 07/23/2022   HDL 52 07/23/2022   LDLCALC 102 07/23/2022   TRIG 83 07/23/2022   CHOLHDL 3.9 02/11/2017   Lab Results  Component Value Date   HGBA1C 5.4 07/23/2022   Lab Results  Component Value Date   VITAMINB12 1,380 07/23/2022   Lab Results  Component Value Date   TSH 0.010 (L) 08/31/2023    PHYSICAL EXAM:  Vitals:   11/16/23 1540  BP: 112/64  Pulse: 68   No data found. There is no height or weight on file to calculate BMI.   Wt Readings from Last 3 Encounters:  10/22/23 190 lb (86.2 kg)  09/10/23 190 lb (86.2 kg)  08/31/23 190 lb (86.2 kg)     Ht Readings from Last 3 Encounters:  10/22/23 5' 6 (1.676 m)  09/10/23 5' 6 (1.676 m)  08/31/23 5' 6 (1.676 m)      General: The patient is awake, alert and appears not in acute distress and groomed. Head: Normocephalic, atraumatic.  Neck is supple.  Cranial nerves: Pupils are equal and briskly reactive to light. Funduscopic exam without evidence of pallor or edema. Extraocular movements  in vertical and horizontal planes intact and without nystagmus. Visual fields by finger perimetry are intact. Hearing corrected with hearing  aids.  Facial sensation intact to fine touch. Facial motor strength is symmetric and tongue and uvula move midline.  Shoulder shrug was asymmetrical.   Neck and shoulders are stiff.- extremely tense. Tension headaches, neck pain-   Motor exam:   Normal tone, muscle bulk and symmetric strength in all extremities.  Sensory:   Vibration sense is  impaired in both knees and ankles- NEUROPATHY>   Coordination: Rapid alternating movements in the fingers/hands was normal.  Finger-to-nose maneuver  normal without evidence of ataxia, dysmetria or tremor.  Gait and station: Patient walks with a walker. She has a hard time rising from a seated position.   Reflexes: attenuated symmetric throughout, none on either patella . No ankle clonus. Babinski's sign is absent bilaterally.  Hoffman's sign is absent bilaterally.   Romberg's sign is positive . Swaying .  Short steps  feet pointing outwards, arm swing preserved, no tremor and  turned with 6 steps, very carefully.    ASSESSMENT AND PLAN :   83 y.o. year old female  here with:   1) Highly compliant patient for OSA on CPAP.    2) Migraines are well controlled.   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  Keeping limber with aqua exercises.  Keep safe from falling.  Plan:  : alpha- lipoic fatty acid.  GNA neuropathy panel ordered to see if this is all about over functioning thyroid .     I would like to thank Nichole Senior, MD and Nichole Senior, Md [address] [place],  [place] [address] for allowing me to meet  with this pleasant patient.   Sleep Clinic Patients are generally offered input on sleep hygiene, life style changes and how to improve compliance with medical treatment where applicable. Review and reiteration of good sleep hygiene measures is offered to any sleep clinic patient, be it in the first consultation or with any follow up visits.   Any patient with sleepiness should be cautioned not to drive, work at heights, or operate dangerous or heavy equipment when  feeling tired or sleepy. The patient will be seen in follow-up in the sleep clinic at [facility] for discussion of test results, sleep related symptoms and treatment compliance review, further management strategies, etc.   The referring provider will be notified of the test results.   The patient's condition requires frequent monitoring and adjustments in the treatment plan, reflecting the ongoing complexity of care.  This provider is the continuing focal point for all needed services for this condition.  After spending a total time of  40  minutes face to face and time for  history taking, physical and neurologic examination, review of laboratory studies,  personal review of imaging studies, reports and results of other testing and review of referral information / records as far as provided in visit,   Electronically signed by: Dedra Gores, MD 11/16/2023 3:46 PM  [facility] Sleep Board certified by The [employer] of Sleep Medicine and Diplomate of the [employer] of Sleep Medicine. Board certified In Neurology through the ABPN, Fellow of the [employer] of Neurology.

## 2023-11-16 NOTE — Progress Notes (Signed)
 [name]

## 2023-11-16 NOTE — Patient Instructions (Addendum)
 Orders Signed This Visit (15)   Alpha Lipoic Acid -Biotin  ER 600-450 MG-MCG TBCR        Take one a day po., OTC     A1c        Routine, Clinic Collect     ANA w/Reflex        Routine, Clinic Collect     CBC with diff        Routine, Clinic Collect     CMP        Routine, Clinic Collect     CRP        Routine, Clinic Collect     ESR        Routine, Clinic Collect     Hepatitis B core antibody, total        Routine, Clinic Collect     Homocysteine        Routine, Clinic Collect     MMA        Routine, Clinic Collect     SPEP with IFE        Routine, Clinic Collect     SSA, SSB        Routine, Clinic Collect     T4, Free        Routine, Clinic Collect     TSH        Routine, Clinic Collect     Vitamin B12        Routine, Clinic Collect         Nerve Damage (Peripheral Neuropathy): What to Know Peripheral neuropathy happens when there's damage to the peripheral nerves. These nerves send signals between your spinal cord and your arms and legs. You can have damage in one or more nerves. What are the causes? There are many reasons why nerves can get damaged. Damage may be caused by some diseases, such as: Diabetes. This is the most common cause. Autoimmune diseases, such as rheumatoid arthritis or lupus. These happen when your body's defense system (immune system) attacks healthy parts of your body by mistake. Inherited nerve disease. This is passed down in families. Kidney disease. Thyroid  disease. Other causes include: Pressure or stress on a nerve that lasts a long time. Lack of B vitamins from poor diet or drinking too much alcohol. Infections. Some medicines, like chemotherapy used for cancer treatment. Poisonous (toxic) substances, such as lead or mercury. Too little blood flowing to the legs. Sometimes, the cause isn't known. What are the signs or symptoms? Symptoms depend on which nerves are damaged. In your legs, hands, or arms, you may have: Loss  of feeling (numbness). Tingling. Burning pain. Very sensitive skin. Weakness. Paralysis. This is when you can't move a part of your body. Clumsiness. Muscle twitching. Loss of balance. You may have trouble walking. Other symptoms can include: Not being able to control when you pee. Feeling dizzy. Problems during sex. How is this diagnosed? Your health care provider will: Ask about your symptoms and medical history. Do a physical exam. Do a neurological exam. They will check your reflexes, how you move, and what you can feel. You may need to have other tests, such as: Blood tests. Nerve tests to check how well your nerves and muscles work. Imaging tests, such as a CT scan or MRI. These are done to rule out other causes. Nerve biopsy. This is when a small piece of a nerve is removed for testing. Lumbar puncture. A small amount of the fluid that surrounds the brain and spinal cord is taken  and checked in a lab. How is this treated? Treatment may include: Treating the cause of the nerve damage. Pain medicines. Other medicines that may help with pain, such as: Medicines that treat seizures. Medicines that treat depression. Pain patches or creams that are put on painful areas of skin. TENS therapy. This uses a device that sends mild electrical pulses to your nerves. This will prevent pain signals from reaching the brain. Massage. Acupuncture. Surgery to relieve pressure on a nerve or to destroy a nerve that's causing pain. This is done only if the other treatments are not helping. You may also need physical therapy to help improve your movement, balance, and muscle strength. You may be told to use a cane or walker if needed. Follow these instructions at home: Medicines Take your medicines only as told. You may need to take steps to help treat or prevent trouble pooping (constipation), such as: Taking medicines to help you poop. Eating foods high in fiber, like beans, whole grains,  and fresh fruits and vegetables. Drinking more fluids as told. Ask your provider if it's safe to drive or use machines while taking your medicine. Lifestyle  Do not smoke, vape, or use nicotine or tobacco. Avoid or limit alcohol. Too much alcohol can be harmful to nerves and cause damage. Eat a healthy diet. Safety  Take care to avoid burning or damaging your skin if you have numbness. If you have numbness in your feet: Check your feet each day for signs of injury or infection. Watch for redness, warmth, and swelling. Wear padded socks and comfortable shoes. These help protect your feet. General instructions If you have diabetes, keep your blood sugar under control. Develop a good support system. Living with nerve damage can cause a lot of stress. Talk with a mental health therapist or join a support group. Exercise as told. Ask what things are safe for you to do at home. Work with a pain specialist to find the best way to manage your pain. Keep all follow-up visits. Your provider will check if the treatments are working and change them if needed. Where to find support The Foundation for Peripheral Neuropathy: [employer].si Where to find more information To learn more, go to: [employer] of Neurological Disorders and Stroke at BasicFM.no. Click Search and type peripheral neuropathy. Find the link you need. Contact a health care provider if: Your pain treatments are not working. Your symptoms are getting worse. You have side effects from any of your medicines. You have new symptoms. You're having a hard time coping with your condition. Get help right away if: You have a wound that's not healing. You have new weakness in an arm or leg. You've fallen or you fall often. This information is not intended to replace advice given to you by your health care provider. Make sure you discuss any questions you have with your health care provider. Document  Revised: 05/07/2023 Document Reviewed: 05/07/2023 Elsevier Patient Education  2025 [employer].

## 2023-11-17 ENCOUNTER — Encounter (HOSPITAL_BASED_OUTPATIENT_CLINIC_OR_DEPARTMENT_OTHER): Payer: Self-pay | Admitting: Physical Therapy

## 2023-11-17 ENCOUNTER — Ambulatory Visit (HOSPITAL_BASED_OUTPATIENT_CLINIC_OR_DEPARTMENT_OTHER): Payer: Self-pay | Admitting: Physical Therapy

## 2023-11-17 DIAGNOSIS — G8929 Other chronic pain: Secondary | ICD-10-CM

## 2023-11-17 DIAGNOSIS — M6281 Muscle weakness (generalized): Secondary | ICD-10-CM

## 2023-11-17 DIAGNOSIS — M5459 Other low back pain: Secondary | ICD-10-CM | POA: Diagnosis not present

## 2023-11-17 DIAGNOSIS — R2681 Unsteadiness on feet: Secondary | ICD-10-CM

## 2023-11-17 NOTE — Therapy (Signed)
 OUTPATIENT PHYSICAL THERAPY THORACOLUMBAR TREATMENT   Patient Name: [name] MRN: [id number] DOB:1940-08-20, 83 y.o., female Today's Date: 11/17/2023  END OF SESSION:  PT End of Session - 11/17/23 1118     Visit Number 3    Number of Visits 16    Date for Recertification  01/15/24    Authorization Type aetna Mcr    Progress Note Due on Visit 10    PT Start Time 1101    PT Stop Time 1142    PT Time Calculation (min) 41 min    Activity Tolerance Patient tolerated treatment well    Behavior During Therapy [facility] for tasks assessed/performed            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   Hyperlipidemia    Impaired hearing    left ear, wears hearing aids   Insomnia    circadian rhythm component   Insomnia    Migraine 11/1986   Spillman   Nuclear sclerosis    OSA on CPAP 03/2005   uses cpap setting of 10   Osteoarthritis 2007   Deveschwar   Pain last week   left under breast pain    Plantar fasciitis    Rheumatic fever    Scarlet fever as child   Sleep apnea    Swallowing difficulty    Thyroid  disorder    Vitamin D  deficiency    Vitreous degeneration of right eye    Past Surgical History:  Procedure Laterality Date   BREAST BIOPSY Left 6/00   [place]   BREAST EXCISIONAL BIOPSY Left 1998   DE QUERVAIN'S RELEASE Right 10/97   Sypher   left breast biopsy     ROOT CANAL  02/11/2012   TONSILLECTOMY  age 71   TONSILLECTOMY AND ADENOIDECTOMY  1952   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   TOTAL KNEE ARTHROPLASTY Right 11/08/2012   Procedure: RIGHT TOTAL KNEE ARTHROPLASTY;  Surgeon: Dempsey LULLA Moan, MD;  Location: [facility];  Service: Orthopedics;  Laterality: Right;   TUBAL LIGATION     Patient Active Problem  List   Diagnosis Date Noted   Venous stasis of lower extremity 07/28/2023   Great toe pain, right 06/16/2023   Acute pain of right knee 05/28/2023   Closed compression fracture of body of first lumbar vertebra (HCC) 05/28/2023   Bilateral lower extremity edema 02/03/2022   Fluid retention 12/17/2021   Low back pain 10/03/2021   Atherosclerotic heart disease of native coronary artery without angina pectoris 05/27/2021   Diastolic dysfunction 05/27/2021   Neck pain 05/27/2021   Migraine 04/23/2021   Generalized obesity 04/23/2021   Depression 03/18/2021   Lumbar spine pain 08/30/2020   Flatulence, eructation and gas pain 05/07/2020   Change in bowel habit 05/07/2020   Gout 06/22/2019   Post viral syndrome 06/15/2019   Cervicalgia 06/15/2019   Chronic tension-type headache, intractable 06/15/2019   Fall 04/21/2018   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   Diverticulosis 12/23/2017   History of adenomatous polyp of colon 12/23/2017   Recurrent falls 12/14/2017   Degenerative disc disease, cervical 12/14/2017   Chronic constipation 12/14/2017   Floaters in visual field, bilateral 08/10/2017  Head injury consultation 08/10/2017   Arthritis of carpometacarpal [facility]) joint of right thumb 05/14/2017   Therapeutic opioid-induced constipation (OIC) 02/05/2017   BMI 29.0-29.9,adult 02/05/2017   Dry mouth 02/05/2017   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/08/2017   Intolerance of continuous positive airway pressure (CPAP) ventilation 01/08/2017   Benign neoplasm of colon 11/11/2016   Other long term (current) drug therapy 11/11/2016   History of total knee arthroplasty, 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   Cough 03/06/2016   At risk for injury related to fall 02/14/2016   Primary osteoarthritis of both hands 02/04/2016   Osteopenia of multiple sites 02/04/2016   Metatarsalgia of both feet 02/04/2016   Migraines 02/04/2016   Other fatigue 02/01/2016   Melena 08/20/2015   Acute recurrent  maxillary sinusitis 08/20/2015   Slow transit constipation 08/20/2015   Vitamin D  deficiency 08/20/2015   Thyroid  activity decreased 08/20/2015   Cephalalgia 08/20/2015   Preop cardiovascular exam 07/20/2015   Subacute ethmoidal sinusitis 12/11/2014   Chronic infection of sinus 07/12/2014   Nasal septal ulcer 05/25/2014   DJD (degenerative joint disease), cervical 03/22/2014   Right hand pain 03/22/2014   Deflected nasal septum 01/30/2014   Hypertrophy of nasal turbinates 01/30/2014   UARS (upper airway resistance syndrome) 10/04/2013   Insomnia secondary to depression with anxiety 10/04/2013   Exertional dyspnea 08/20/2013   History of rheumatic fever 08/20/2013   Hypomania (mild) single episode or unspecified 08/09/2013   OSA on CPAP 04/06/2013   Postoperative anemia due to acute blood loss 11/09/2012   OA (osteoarthritis) of knee 11/08/2012   History of giardia infection 07/15/2012   Atrophic vaginitis 07/15/2012   Vitreous degeneration of right eye    Nuclear sclerosis    Right shoulder pain 06/18/2011   Foot pain, left 06/18/2011   Hyperlipidemia 03/24/2011   Disorder of bone 02/12/2009   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   Migraine, unspecified, not intractable, without status migrainosus 02/12/2009    PCP: Garnette Ore MD  REFERRING PROVIDER: Helene Haddock MD  REFERRING DIAG:  [phone] (ICD-10-CM) - Acute pain of right knee  S32.010A (ICD-10-CM) - Closed compression fracture of body of L1 vertebra (HCC)    Rationale for Evaluation and Treatment: Rehabilitation  THERAPY DIAG:  Other low back pain  Muscle weakness (generalized)  Chronic pain of right knee  Unsteadiness on feet  ONSET DATE: March 2025  SUBJECTIVE:                                                                                                                                                                                           SUBJECTIVE STATEMENT:   Feeling  a little better,  moving around in general has gotten a lot easier. Able to get out of restaurant seats on my own now. Balance is still off. Neurologist said I may have some type of neuropathy in my legs and feet, have to get about 12 blood tests done, still pending.    EvalBETHA Rasmussen in June. Have had  2 compression fx.    2 falls in one day early march 1st compression fx then again in June sustaining a second.  Has a membership here at [facility]. I have been Walking in pool 4 x week for 45 mins to 1 hr. But I still hurt. I want to work on my balance and stand up from a chair without using my arms.  Standing up from a chair my right knee and back hurts. Using rollator when walking back to pool but not any other time  PERTINENT HISTORY:  Evaluate and treat for L1 compression fx and right knee pain. Include aquatic therapy in treatment plan.  Chronic fatigue; fibromyalgia  PAIN:  Are you having pain? Yes: NPRS scale: current 4/10; worst 7/10 Pain location: right knee and LB Pain description: sharp but you just never know  Aggravating factors: transfering STS; in and out of bed; in and out of car Relieving factors: rest  PRECAUTIONS: Fall  RED FLAGS: None   WEIGHT BEARING RESTRICTIONS: No  FALLS:  Has patient fallen in last 6 months? Yes. Number of falls 1 fall backwards after putting my foot on a chair to toe my shoe.  LIVING ENVIRONMENT: Lives with: lives with their spouse Lives in: House/apartment Has following equipment at home: Single point cane, Environmental consultant - 2 wheeled, Environmental consultant - 4 wheeled, and Grab bars  OCCUPATION: retired Financial controller  PLOF: Independent  PATIENT GOALS: Improve balance, get in and out of chairs, car and bed better  NEXT MD VISIT: as needed  OBJECTIVE:  Note: Objective measures were completed at Evaluation unless otherwise noted.  DIAGNOSTIC FINDINGS:  7/25 CT Lumbar IMPRESSION: 1. Subacute compression deformities at T12 and L1 with loss of height of 10% at each  level. Sclerotic change consistent with ongoing healing process. I cannot state by CT if there is complete healing. No retropulsed bone. 2. L3-4: Mild disc bulge and facet osteoarthritis. No compressive stenosis. 3. L4-5: Mild disc bulge. Bilateral facet degeneration and hypertrophy. 1-2 mm of degenerative anterolisthesis. Mild narrowing of both lateral recesses, not grossly compressive. 4. L5-S1: Endplate osteophytes and bulging of the disc. Bilateral facet osteoarthritis worse on the right. No compressive stenosis.  05/29/23 Xray R knee IMPRESSION: 1. 9.3 x 3.9 x 3.7 cm heterogenous layering hypodense fluid collection most compatible with a hematoma extending along the superficial fascia of the right posterolateral thigh, overlying and compressing the biceps femoris musculature, which could represent a soft tissue degloving injury, such as a Morel-Lavallee lesion. This collection appears to continue below the level of the knee into the lateral gastrocnemius muscle, concerning for intramuscular hematoma. 2. Intact right total knee arthroplasty. No acute osseous abnormality. 3. Small knee joint effusion.  PATIENT SURVEYS:  ODI  COGNITION: Overall cognitive status: Within functional limits for tasks assessed     SENSATION: WFL  MUSCLE LENGTH: Hamstrings: wfl   POSTURE: rounded shoulders, forward head, decreased thoracic kyphosis, and left pelvic obliquity  PALPATION: Right patella crepitus  LUMBAR ROM:   AROM eval  Flexion full  Extension   Right lateral flexion 90% limited P!  Left lateral flexion 90% limited P!  Right rotation   Left rotation    (Blank rows = not tested)  LOWER EXTREMITY ROM:     Active  Right eval Left eval  Hip flexion    Hip extension    Hip abduction    Hip adduction    Hip internal rotation    Hip external rotation    Knee flexion 110 128  Knee extension 0 0  Ankle dorsiflexion    Ankle plantarflexion    Ankle inversion     Ankle eversion     (Blank rows = not tested)  LOWER EXTREMITY MMT:    MMT Right eval Left eval  Hip flexion 3 3  Hip extension    Hip abduction 4+ 4+  Hip adduction 4- 4-  Hip internal rotation    Hip external rotation    Knee flexion 4 5  Knee extension 3+ 4  Ankle dorsiflexion    Ankle plantarflexion    Ankle inversion    Ankle eversion     (Blank rows = not tested)  LUMBAR SPECIAL TESTS:  Slump test: Negative  FUNCTIONAL TESTS:  30 seconds chair stand test Timed up and go (TUG): 21.24   Item Test date: 11/05/23 Date:  Date:   Sitting to standing 2. able to stand using hands after several tries Insert SmartPhrase OPRCBERGREEVAL Insert SmartPhrase OPRCBERGREEVAL  2. Standing unsupported 4. able to stand safely for 2 minutes    3. Sitting with back /unsupported, feet supported 4. able to sit safely and securely for 2 minutes    4. Standing to sitting 2. uses back of legs against chair to control descent    5. Pivot transfer  3. able to transfer safely with definite need of hands    6. Standing unsupported with eyes closed 2. able to stand 3 seconds    7. Standing unsupported with feet together 1. needs help to attain position but able to stand 15 seconds feet together    8. Reaching forward with outstretched arms while standing 2. can reach forward 5 cm (2 inches)    9. Pick up object from the floor from standing 3. able to pick up slipper but needs supervision    10. Turning to look behind over left and right shoulders while standing 3. looks behind one side only, other side shows less weight shift    11. Turn 360 degrees 1. needs close supervision or verbal cuing    12. Place alternate foot on step or stool while standing unsupported 0. needs assistance to keep from falling/unable to try    13. Standing unsupported one foot in front 0. loses balance while stepping or standing    14. Standing on one leg 0. unable to try of needs assist to prevent fall      Total Score  27/56 Total Score:    Total Score:     GAIT: Distance walked: 400 ft Assistive device utilized: rollator Level of assistance: Complete Independence Comments: quickened short step length, reduced hip/knee flex, little arm swing  TREATMENT   11/17/23  Partial range bridges 2x10 PPT x15 Single leg clamshells red TB + TA set x10 B SAQs 4# x12 B  Wide tandem stance 3x30 seconds Narrow BOS solid surface 3x30 seconds  Nustep L5x6 minutes seat 9 BLEs only       9/15  Supine PPT 2x10 2s PPT with bridge 2x10 LTR 20x Supine piriformis 30s 3x each STS from elevated table 3x8  Daily walking program of at least  10 min per day   Eval Self care:Posture and body mechanic instruction; grab bars for commode; shower. STS transfers from chairs in restaurants; use of AD; safety precautions to avoid falls                                                                                                                             PATIENT EDUCATION:  Education details: anatomy, exercise progression, DOMS expectations, muscle firing,  envelope of function, HEP, POC  Person educated: Patient Education method: Explanation Education comprehension: verbalized understanding  HOME EXERCISE PROGRAM: Access Code: [email or web address] URL: [email or web address] Date: 11/09/2023 Prepared by: Dale Call  ASSESSMENT:  CLINICAL IMPRESSION:   Arrives today doing well, sounds like she is already starting to feel a little better from PT and has been able to mobilize a bit more efficiently in the community. Continued land based focus on functional strengthening and balance work today, she is very motivated to improve.    Eval: Patient is an 83 y.o. f who was seen today for physical therapy evaluation and treatment for back and right knee pain. PmHx includes bilater TKR >8-10 yrs ago and recent falls onto right knee sustaining T12 and L1 compression fx (earlier this year).  She is a member here  at [facility] and comes 4 days a week and walks submerged x 45 -60 mins.   She presents with pain limited deficits in mid to lb and right knee  effecting strength, activity tolerance, gait, balance, and functional mobility with ADL's. Specifically she reports most difficulty with all transfers STS; sup to sit and getting out of car. Pt and cg given information on various grab bars for bed, commode and shower encouraging increased safety at home. As indicated by subjective information, and objective and outcome measure score, deficits identified are affecting and limiting overall functional mobility.    OBJECTIVE IMPAIRMENTS: Abnormal gait, decreased activity tolerance, decreased balance, decreased knowledge of use of DME, decreased mobility, difficulty walking, decreased ROM, decreased strength, postural dysfunction, obesity, and pain.   ACTIVITY LIMITATIONS: carrying, lifting, bending, sitting, standing, squatting, stairs, and transfers  PARTICIPATION LIMITATIONS: meal prep, cleaning, laundry, driving, shopping, and community activity  PERSONAL FACTORS: Age and 1-2 comorbidities: see PmHx are also affecting patient's functional outcome.   REHAB POTENTIAL: Good  CLINICAL DECISION MAKING: Evolving/moderate complexity  EVALUATION COMPLEXITY: Moderate   GOALS: Goals reviewed with patient? Yes  SHORT TERM GOALS: Target date: 12/14/23 (delay in beginning therapy)  Pt will tolerate full aquatic sessions consistently without increase in pain and with improving function to demonstrate good toleration and effectiveness of intervention.  Baseline: Goal status: INITIAL  2.  Pt will tolerate stair climbing in and out of pool using approp pattern ascending and descending 6 steps with use of handrail  Baseline:  Goal status: INITIAL  3.  Pt will perform 10 STS from water  bench onto water  step unsupported without LOB Baseline:  Goal status: INITIAL  4.  Pt and cg  will consider grab bars for commode   Baseline:  Goal status: INITIAL  5.  Pt will be indep and compliant with initial land based HEP for improving strength and safety at home Baseline:  Goal status: INITIAL    LONG TERM GOALS: Target date: 01/15/24  Pt to improve on ODI by 13% to demonstrate statistically significant Improvement in function. (MCID 13-15%) Baseline: 23/50=46% Goal status: INITIAL  2.  Pt will improve strength in bil hip flex and right knee ext by 1 grade to demonstrate improved overall physical function Baseline: see chart Goal status: INITIAL  3.  Pt will improve on Berg balance test to >/= 35/56 to demonstrate a decrease in fall risk. (MCD 7) Baseline:27/56 (medium fall risk)  Goal status: INITIAL  4.  Pt will improve on 30s STS test  from standard chair from 3 to 6 to demonstrate improving functional lower extremity strength, transitional movements, and balance. (MDC = 4.2sec)  Baseline: 3 Goal status: INITIAL  5.  Pt will report decrease in pain by at least 50% for improved toleration to activity/quality of life and to demonstrate improved management of pain. Baseline: see chart Goal status: INITIAL  6.  Pt will be indep with final HEP's (land and aquatic as appropriate) for continued management of condition Baseline: see chart Goal status: INITIAL  PLAN:  PT FREQUENCY: 2x/week  PT DURATION: 10 w(extended out due to scheduling conflicts) Initial 8 sessions aquatic with 1 Land based for instruction on land based initial HEP. Then last 7-8 alternate  PLANNED INTERVENTIONS: 97164- PT Re-evaluation, 97750- Physical Performance Testing, 97110-Therapeutic exercises, 97530- Therapeutic activity, 97112- Neuromuscular re-education, 97535- Self Care, [id number]- Manual therapy, [phone]- Gait training, [phone]- Aquatic Therapy, [phone]- Ionotophoresis 4mg /ml Dexamethasone , 79439 (1-2 muscles), [id number] (3+ muscles)- Dry Needling, Patient/Family education, Balance training, Stair training, Taping, Joint  mobilization, DME instructions, Cryotherapy, and Moist heat.  PLAN FOR NEXT SESSION: aquatic: core and le strengthening, transfers, stair climbing, balance retraining  Land:  hip and knee strength, balance, endurance training, gait training   Josette Rough, PT, DPT 11/17/23 11:43 AM

## 2023-11-20 ENCOUNTER — Other Ambulatory Visit: Payer: Self-pay | Admitting: Neurology

## 2023-11-25 ENCOUNTER — Ambulatory Visit: Payer: Self-pay | Admitting: Neurology

## 2023-11-25 LAB — MULTIPLE MYELOMA PANEL, SERUM
Albumin SerPl Elph-Mcnc: 3.5 g/dL (ref 2.9–4.4)
Albumin/Glob SerPl: 1.3 (ref 0.7–1.7)
Alpha 1: 0.3 g/dL (ref 0.0–0.4)
Alpha2 Glob SerPl Elph-Mcnc: 0.6 g/dL (ref 0.4–1.0)
B-Globulin SerPl Elph-Mcnc: 0.9 g/dL (ref 0.7–1.3)
Gamma Glob SerPl Elph-Mcnc: 1 g/dL (ref 0.4–1.8)
Globulin, Total: 2.8 g/dL (ref 2.2–3.9)
IgA/Immunoglobulin A, Serum: 157 mg/dL (ref 64–422)
IgG (Immunoglobin G), Serum: 1014 mg/dL (ref 586–1602)
IgM (Immunoglobulin M), Srm: 123 mg/dL (ref 26–217)

## 2023-11-25 LAB — COMPREHENSIVE METABOLIC PANEL WITH GFR
ALT: 13 IU/L (ref 0–32)
AST: 17 IU/L (ref 0–40)
Albumin: 4.1 g/dL (ref 3.7–4.7)
Alkaline Phosphatase: 76 IU/L (ref 48–129)
BUN/Creatinine Ratio: 38 — ABNORMAL HIGH (ref 12–28)
BUN: 23 mg/dL (ref 8–27)
Bilirubin Total: 0.3 mg/dL (ref 0.0–1.2)
CO2: 23 mmol/L (ref 20–29)
Calcium: 9.9 mg/dL (ref 8.7–10.3)
Chloride: 104 mmol/L (ref 96–106)
Creatinine, Ser: 0.6 mg/dL (ref 0.57–1.00)
Globulin, Total: 2.2 g/dL (ref 1.5–4.5)
Glucose: 150 mg/dL — ABNORMAL HIGH (ref 70–99)
Potassium: 3.8 mmol/L (ref 3.5–5.2)
Sodium: 140 mmol/L (ref 134–144)
Total Protein: 6.3 g/dL (ref 6.0–8.5)
eGFR: 89 mL/min/1.73 (ref >59)

## 2023-11-25 LAB — C-REACTIVE PROTEIN: CRP: 4 mg/L (ref 0–10)

## 2023-11-25 LAB — HEMOGLOBIN A1C
Est. average glucose Bld gHb Est-mCnc: 120 mg/dL
Hgb A1c MFr Bld: 5.8 % — ABNORMAL HIGH (ref 4.8–5.6)

## 2023-11-25 LAB — CBC WITH DIFFERENTIAL/PLATELET
Basophils Absolute: 0 x10E3/uL (ref 0.0–0.2)
Basos: 0 %
EOS (ABSOLUTE): 0.1 x10E3/uL (ref 0.0–0.4)
Eos: 2 %
Hematocrit: 41.4 % (ref 34.0–46.6)
Hemoglobin: 13.2 g/dL (ref 11.1–15.9)
Immature Grans (Abs): 0 x10E3/uL (ref 0.0–0.1)
Immature Granulocytes: 0 %
Lymphocytes Absolute: 1.6 x10E3/uL (ref 0.7–3.1)
Lymphs: 20 %
MCH: 28.5 pg (ref 26.6–33.0)
MCHC: 31.9 g/dL (ref 31.5–35.7)
MCV: 89 fL (ref 79–97)
Monocytes Absolute: 0.5 x10E3/uL (ref 0.1–0.9)
Monocytes: 6 %
Neutrophils Absolute: 5.5 x10E3/uL (ref 1.4–7.0)
Neutrophils: 72 %
Platelets: 248 x10E3/uL (ref 150–450)
RBC: 4.63 x10E6/uL (ref 3.77–5.28)
RDW: 13.1 % (ref 11.7–15.4)
WBC: 7.7 x10E3/uL (ref 3.4–10.8)

## 2023-11-25 LAB — HEPATITIS B CORE ANTIBODY, TOTAL: Hep B Core Total Ab: NEGATIVE

## 2023-11-25 LAB — ANA W/REFLEX: ANA Titer 1: NEGATIVE

## 2023-11-25 LAB — TSH: TSH: 0.007 u[IU]/mL — ABNORMAL LOW (ref 0.450–4.500)

## 2023-11-25 LAB — METHYLMALONIC ACID, SERUM: Methylmalonic Acid: 193 nmol/L (ref 0–378)

## 2023-11-25 LAB — HOMOCYSTEINE: Homocysteine: 12.7 umol/L (ref 0.0–21.3)

## 2023-11-25 LAB — SEDIMENTATION RATE: Sed Rate: 7 mm/h (ref 0–40)

## 2023-11-25 LAB — VITAMIN B12: Vitamin B-12: 964 pg/mL (ref 232–1245)

## 2023-11-25 LAB — T4, FREE: Free T4: 1.7 ng/dL (ref 0.82–1.77)

## 2023-11-26 ENCOUNTER — Ambulatory Visit (HOSPITAL_BASED_OUTPATIENT_CLINIC_OR_DEPARTMENT_OTHER): Attending: Sports Medicine | Admitting: Physical Therapy

## 2023-11-26 ENCOUNTER — Encounter (HOSPITAL_BASED_OUTPATIENT_CLINIC_OR_DEPARTMENT_OTHER): Payer: Self-pay | Admitting: Physical Therapy

## 2023-11-26 DIAGNOSIS — R2681 Unsteadiness on feet: Secondary | ICD-10-CM | POA: Diagnosis present

## 2023-11-26 DIAGNOSIS — M6281 Muscle weakness (generalized): Secondary | ICD-10-CM | POA: Insufficient documentation

## 2023-11-26 DIAGNOSIS — M25561 Pain in right knee: Secondary | ICD-10-CM | POA: Diagnosis present

## 2023-11-26 DIAGNOSIS — G8929 Other chronic pain: Secondary | ICD-10-CM | POA: Diagnosis present

## 2023-11-26 DIAGNOSIS — M5459 Other low back pain: Secondary | ICD-10-CM | POA: Insufficient documentation

## 2023-11-26 NOTE — Therapy (Signed)
 OUTPATIENT PHYSICAL THERAPY THORACOLUMBAR TREATMENT   Patient Name: [name] MRN: [id number] DOB:04/01/1940, 83 y.o., female Today's Date: 11/26/2023  END OF SESSION:  PT End of Session - 11/26/23 0808     Visit Number 4    Number of Visits 16    Date for Recertification  01/15/24    Authorization Type aetna Mcr    Progress Note Due on Visit 10    PT Start Time 0801    PT Stop Time 0840    PT Time Calculation (min) 39 min    Activity Tolerance Patient tolerated treatment well    Behavior During Therapy [facility] for tasks assessed/performed            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   Hyperlipidemia    Impaired hearing    left ear, wears hearing aids   Insomnia    circadian rhythm component   Insomnia    Migraine 11/1986   Spillman   Nuclear sclerosis    OSA on CPAP 03/2005   uses cpap setting of 10   Osteoarthritis 2007   Deveschwar   Pain last week   left under breast pain    Plantar fasciitis    Rheumatic fever    Scarlet fever as child   Sleep apnea    Swallowing difficulty    Thyroid  disorder    Vitamin D  deficiency    Vitreous degeneration of right eye    Past Surgical History:  Procedure Laterality Date   BREAST BIOPSY Left 6/00   Hardcastle   BREAST EXCISIONAL BIOPSY Left 1998   DE QUERVAIN'S RELEASE Right 10/97   Sypher   left breast biopsy     ROOT CANAL  02/11/2012   TONSILLECTOMY  age 44   TONSILLECTOMY AND ADENOIDECTOMY  1952   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   TOTAL KNEE ARTHROPLASTY Right 11/08/2012   Procedure: RIGHT TOTAL KNEE ARTHROPLASTY;  Surgeon: Dempsey LULLA Moan, MD;  Location: [facility];  Service: Orthopedics;  Laterality: Right;   TUBAL LIGATION     Patient Active Problem  List   Diagnosis Date Noted   Venous stasis of lower extremity 07/28/2023   Great toe pain, right 06/16/2023   Acute pain of right knee 05/28/2023   Closed compression fracture of body of first lumbar vertebra (HCC) 05/28/2023   Bilateral lower extremity edema 02/03/2022   Fluid retention 12/17/2021   Low back pain 10/03/2021   Atherosclerotic heart disease of native coronary artery without angina pectoris 05/27/2021   Diastolic dysfunction 05/27/2021   Neck pain 05/27/2021   Migraine 04/23/2021   Generalized obesity 04/23/2021   Depression 03/18/2021   Lumbar spine pain 08/30/2020   Flatulence, eructation and gas pain 05/07/2020   Change in bowel habit 05/07/2020   Gout 06/22/2019   Post viral syndrome 06/15/2019   Cervicalgia 06/15/2019   Chronic tension-type headache, intractable 06/15/2019   Fall 04/21/2018   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   Diverticulosis 12/23/2017   History of adenomatous polyp of colon 12/23/2017   Recurrent falls 12/14/2017   Degenerative disc disease, cervical 12/14/2017   Chronic constipation 12/14/2017   Floaters in visual field, bilateral 08/10/2017  Head injury consultation 08/10/2017   Arthritis of carpometacarpal [facility]) joint of right thumb 05/14/2017   Therapeutic opioid-induced constipation (OIC) 02/05/2017   BMI 29.0-29.9,adult 02/05/2017   Dry mouth 02/05/2017   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/08/2017   Intolerance of continuous positive airway pressure (CPAP) ventilation 01/08/2017   Benign neoplasm of colon 11/11/2016   Other long term (current) drug therapy 11/11/2016   History of total knee arthroplasty, 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   Cough 03/06/2016   At risk for injury related to fall 02/14/2016   Primary osteoarthritis of both hands 02/04/2016   Osteopenia of multiple sites 02/04/2016   Metatarsalgia of both feet 02/04/2016   Migraines 02/04/2016   Other fatigue 02/01/2016   Melena 08/20/2015   Acute recurrent  maxillary sinusitis 08/20/2015   Slow transit constipation 08/20/2015   Vitamin D  deficiency 08/20/2015   Thyroid  activity decreased 08/20/2015   Cephalalgia 08/20/2015   Preop cardiovascular exam 07/20/2015   Subacute ethmoidal sinusitis 12/11/2014   Chronic infection of sinus 07/12/2014   Nasal septal ulcer 05/25/2014   DJD (degenerative joint disease), cervical 03/22/2014   Right hand pain 03/22/2014   Deflected nasal septum 01/30/2014   Hypertrophy of nasal turbinates 01/30/2014   UARS (upper airway resistance syndrome) 10/04/2013   Insomnia secondary to depression with anxiety 10/04/2013   Exertional dyspnea 08/20/2013   History of rheumatic fever 08/20/2013   Hypomania (mild) single episode or unspecified 08/09/2013   OSA on CPAP 04/06/2013   Postoperative anemia due to acute blood loss 11/09/2012   OA (osteoarthritis) of knee 11/08/2012   History of giardia infection 07/15/2012   Atrophic vaginitis 07/15/2012   Vitreous degeneration of right eye    Nuclear sclerosis    Right shoulder pain 06/18/2011   Foot pain, left 06/18/2011   Hyperlipidemia 03/24/2011   Disorder of bone 02/12/2009   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   Migraine, unspecified, not intractable, without status migrainosus 02/12/2009    PCP: Garnette Ore MD  REFERRING PROVIDER: Helene Haddock MD  REFERRING DIAG:  [phone] (ICD-10-CM) - Acute pain of right knee  S32.010A (ICD-10-CM) - Closed compression fracture of body of L1 vertebra (HCC)    Rationale for Evaluation and Treatment: Rehabilitation  THERAPY DIAG:  Other low back pain  Muscle weakness (generalized)  Chronic pain of right knee  Unsteadiness on feet  ONSET DATE: March 2025  SUBJECTIVE:                                                                                                                                                                                           SUBJECTIVE STATEMENT: Pt reports she  is currently  walking in lap pool outside of therapy. She voices concern with her balance.    EvalBETHA Rasmussen in June. Have had  2 compression fx.    2 falls in one day early march 1st compression fx then again in June sustaining a second.  Has a membership here at [facility]. I have been Walking in pool 4 x week for 45 mins to 1 hr. But I still hurt. I want to work on my balance and stand up from a chair without using my arms.  Standing up from a chair my right knee and back hurts. Using rollator when walking back to pool but not any other time  PERTINENT HISTORY:  Evaluate and treat for L1 compression fx and right knee pain. Include aquatic therapy in treatment plan.  Chronic fatigue; fibromyalgia  PAIN:  Are you having pain? no: NPRS scale: 0/10 - took medication prior to session. Pain location:  Pain description:  Aggravating factors: transfering STS; in and out of bed; in and out of car Relieving factors: rest  PRECAUTIONS: Fall  RED FLAGS: None   WEIGHT BEARING RESTRICTIONS: No  FALLS:  Has patient fallen in last 6 months? Yes. Number of falls 1 fall backwards after putting my foot on a chair to toe my shoe.  LIVING ENVIRONMENT: Lives with: lives with their spouse Lives in: House/apartment Has following equipment at home: Single point cane, Environmental consultant - 2 wheeled, Environmental consultant - 4 wheeled, and Grab bars  OCCUPATION: retired Financial controller  PLOF: Independent  PATIENT GOALS: Improve balance, get in and out of chairs, car and bed better  NEXT MD VISIT: as needed  OBJECTIVE:  Note: Objective measures were completed at Evaluation unless otherwise noted.  DIAGNOSTIC FINDINGS:  7/25 CT Lumbar IMPRESSION: 1. Subacute compression deformities at T12 and L1 with loss of height of 10% at each level. Sclerotic change consistent with ongoing healing process. I cannot state by CT if there is complete healing. No retropulsed bone. 2. L3-4: Mild disc bulge and facet osteoarthritis. No  compressive stenosis. 3. L4-5: Mild disc bulge. Bilateral facet degeneration and hypertrophy. 1-2 mm of degenerative anterolisthesis. Mild narrowing of both lateral recesses, not grossly compressive. 4. L5-S1: Endplate osteophytes and bulging of the disc. Bilateral facet osteoarthritis worse on the right. No compressive stenosis.  05/29/23 Xray R knee IMPRESSION: 1. 9.3 x 3.9 x 3.7 cm heterogenous layering hypodense fluid collection most compatible with a hematoma extending along the superficial fascia of the right posterolateral thigh, overlying and compressing the biceps femoris musculature, which could represent a soft tissue degloving injury, such as a Morel-Lavallee lesion. This collection appears to continue below the level of the knee into the lateral gastrocnemius muscle, concerning for intramuscular hematoma. 2. Intact right total knee arthroplasty. No acute osseous abnormality. 3. Small knee joint effusion.  PATIENT SURVEYS:  ODI  COGNITION: Overall cognitive status: Within functional limits for tasks assessed     SENSATION: WFL  MUSCLE LENGTH: Hamstrings: wfl   POSTURE: rounded shoulders, forward head, decreased thoracic kyphosis, and left pelvic obliquity  PALPATION: Right patella crepitus  LUMBAR ROM:   AROM eval  Flexion full  Extension   Right lateral flexion 90% limited P!  Left lateral flexion 90% limited P!  Right rotation   Left rotation    (Blank rows = not tested)  LOWER EXTREMITY ROM:     Active  Right eval Left eval  Hip flexion    Hip extension    Hip abduction  Hip adduction    Hip internal rotation    Hip external rotation    Knee flexion 110 128  Knee extension 0 0  Ankle dorsiflexion    Ankle plantarflexion    Ankle inversion    Ankle eversion     (Blank rows = not tested)  LOWER EXTREMITY MMT:    MMT Right eval Left eval  Hip flexion 3 3  Hip extension    Hip abduction 4+ 4+  Hip adduction 4- 4-  Hip internal  rotation    Hip external rotation    Knee flexion 4 5  Knee extension 3+ 4  Ankle dorsiflexion    Ankle plantarflexion    Ankle inversion    Ankle eversion     (Blank rows = not tested)  LUMBAR SPECIAL TESTS:  Slump test: Negative  FUNCTIONAL TESTS:  30 seconds chair stand test Timed up and go (TUG): 21.24   Item Test date: 11/05/23 Date:  Date:   Sitting to standing 2. able to stand using hands after several tries Insert SmartPhrase OPRCBERGREEVAL Insert SmartPhrase OPRCBERGREEVAL  2. Standing unsupported 4. able to stand safely for 2 minutes    3. Sitting with back /unsupported, feet supported 4. able to sit safely and securely for 2 minutes    4. Standing to sitting 2. uses back of legs against chair to control descent    5. Pivot transfer  3. able to transfer safely with definite need of hands    6. Standing unsupported with eyes closed 2. able to stand 3 seconds    7. Standing unsupported with feet together 1. needs help to attain position but able to stand 15 seconds feet together    8. Reaching forward with outstretched arms while standing 2. can reach forward 5 cm (2 inches)    9. Pick up object from the floor from standing 3. able to pick up slipper but needs supervision    10. Turning to look behind over left and right shoulders while standing 3. looks behind one side only, other side shows less weight shift    11. Turn 360 degrees 1. needs close supervision or verbal cuing    12. Place alternate foot on step or stool while standing unsupported 0. needs assistance to keep from falling/unable to try    13. Standing unsupported one foot in front 0. loses balance while stepping or standing    14. Standing on one leg 0. unable to try of needs assist to prevent fall      Total Score 27/56 Total Score:    Total Score:     GAIT: Distance walked: 400 ft Assistive device utilized: rollator Level of assistance: Complete Independence Comments: quickened short step length, reduced  hip/knee flex, little arm swing  TREATMENT  [facility] Adult PT Treatment:                                             Date: 11/26/23 Pt seen for aquatic therapy today.  Treatment took place in water  3.5-4.75 ft in depth at the [facility] pool. Temp of water  was 91.  Pt entered/exited the pool via stairs with bil rail.  - unsupported (4 ft) walking forward/ backward, cues for vertical trunk, increased Rt step height, equal step length - unsupported side stepping with arm add/abdct -> with rainbow hand floats - cues for neutral Rt  foot - suitcase carry with bil / single rainbow hand float, walking forward / backward - tandem stance with horz abdct/add with rainbow hand floats (good challenge) - tandem gait forward/ backward with rainbow hand floats (good balance challenge) - wall push up/off for reactive balance  - alternating toe taps to 1st step x 10, without UE support (good balance challenge in Rt SLS)  Pt requires the buoyancy and hydrostatic pressure of water  for support, and to offload joints by unweighting joint load by at least 50 % in navel deep water  and by at least 75-80% in chest to neck deep water .  Viscosity of the water  is needed for resistance of strengthening. Water  current perturbations provides challenge to standing balance requiring increased core activation.    11/17/23  Partial range bridges 2x10 PPT x15 Single leg clamshells red TB + TA set x10 B SAQs 4# x12 B  Wide tandem stance 3x30 seconds Narrow BOS solid surface 3x30 seconds  Nustep L5x6 minutes seat 9 BLEs only   9/15  Supine PPT 2x10 2s PPT with bridge 2x10 LTR 20x Supine piriformis 30s 3x each STS from elevated table 3x8  Daily walking program of at least 10 min per day   Eval Self care:Posture and body mechanic instruction; grab bars for commode; shower. STS transfers from chairs in restaurants; use of AD; safety precautions to avoid falls                                                                                                                              PATIENT EDUCATION:  Education details: intro to aquatic therapy   Person educated: Patient Education method: Explanation, demonstration Education comprehension: verbalized understanding  HOME EXERCISE PROGRAM: Access Code: [email or web address] URL: [email or web address] Date: 11/09/2023 Prepared by: Dale Call  ASSESSMENT:  CLINICAL IMPRESSION: Pt demonstrates safety and independence in aquatic setting with therapist instructing from deck. She is confident in setting, moving throughout all depths easily.She requires moderate cues for posture with forward ambulation. Introduced standing balance challenges in pool with good tolerance. No increase in pain during session. Goals are ongoing.     Eval: Patient is an 83 y.o. f who was seen today for physical therapy evaluation and treatment for back and right knee pain. PmHx includes bilater TKR >8-10 yrs ago and recent falls onto right knee sustaining T12 and L1 compression fx (earlier this year).  She is a member here at [facility] and comes 4 days a week and walks submerged x 45 -60 mins.   She presents with pain limited deficits in mid to lb and right knee  effecting strength, activity tolerance, gait, balance, and functional mobility with ADL's. Specifically she reports most difficulty with all transfers STS; sup to sit and getting out of car. Pt and cg given information on various grab bars for bed, commode and shower encouraging increased safety at home. As indicated by subjective information, and objective and outcome measure score, deficits identified are affecting  and limiting overall functional mobility.    OBJECTIVE IMPAIRMENTS: Abnormal gait, decreased activity tolerance, decreased balance, decreased knowledge of use of DME, decreased mobility, difficulty walking, decreased ROM, decreased strength, postural dysfunction, obesity, and pain.   ACTIVITY  LIMITATIONS: carrying, lifting, bending, sitting, standing, squatting, stairs, and transfers  PARTICIPATION LIMITATIONS: meal prep, cleaning, laundry, driving, shopping, and community activity  PERSONAL FACTORS: Age and 1-2 comorbidities: see PmHx are also affecting patient's functional outcome.   REHAB POTENTIAL: Good  CLINICAL DECISION MAKING: Evolving/moderate complexity  EVALUATION COMPLEXITY: Moderate   GOALS: Goals reviewed with patient? Yes  SHORT TERM GOALS: Target date: 12/14/23 (delay in beginning therapy)  Pt will tolerate full aquatic sessions consistently without increase in pain and with improving function to demonstrate good toleration and effectiveness of intervention.  Baseline: Goal status: INITIAL  2.  Pt will tolerate stair climbing in and out of pool using approp pattern ascending and descending 6 steps with use of handrail  Baseline:  Goal status: INITIAL  3.  Pt will perform 10 STS from water  bench onto water  step unsupported without LOB Baseline:  Goal status: INITIAL  4.  Pt and cg will consider grab bars for commode  Baseline:  Goal status: INITIAL  5.  Pt will be indep and compliant with initial land based HEP for improving strength and safety at home Baseline:  Goal status: INITIAL    LONG TERM GOALS: Target date: 01/15/24  Pt to improve on ODI by 13% to demonstrate statistically significant Improvement in function. (MCID 13-15%) Baseline: 23/50=46% Goal status: INITIAL  2.  Pt will improve strength in bil hip flex and right knee ext by 1 grade to demonstrate improved overall physical function Baseline: see chart Goal status: INITIAL  3.  Pt will improve on Berg balance test to >/= 35/56 to demonstrate a decrease in fall risk. (MCD 7) Baseline:27/56 (medium fall risk)  Goal status: INITIAL  4.  Pt will improve on 30s STS test  from standard chair from 3 to 6 to demonstrate improving functional lower extremity strength, transitional  movements, and balance. (MDC = 4.2sec)  Baseline: 3 Goal status: INITIAL  5.  Pt will report decrease in pain by at least 50% for improved toleration to activity/quality of life and to demonstrate improved management of pain. Baseline: see chart Goal status: INITIAL  6.  Pt will be indep with final HEP's (land and aquatic as appropriate) for continued management of condition Baseline: see chart Goal status: INITIAL  PLAN:  PT FREQUENCY: 2x/week  PT DURATION: 10 w(extended out due to scheduling conflicts) Initial 8 sessions aquatic with 1 Land based for instruction on land based initial HEP. Then last 7-8 alternate  PLANNED INTERVENTIONS: 97164- PT Re-evaluation, 97750- Physical Performance Testing, 97110-Therapeutic exercises, 97530- Therapeutic activity, [id number]- Neuromuscular re-education, 97535- Self Care, 02859- Manual therapy, [phone]- Gait training, [phone]- Aquatic Therapy, [phone]- Ionotophoresis 4mg /ml Dexamethasone , 79439 (1-2 muscles), 20561 (3+ muscles)- Dry Needling, Patient/Family education, Balance training, Stair training, Taping, Joint mobilization, DME instructions, Cryotherapy, and Moist heat.  PLAN FOR NEXT SESSION: aquatic: core and le strengthening, transfers, stair climbing, balance retraining  Land:  hip and knee strength, balance, endurance training, gait training  Delon Aquas, PTA 11/26/23 10:23 AM [facility] [address] [place], [place], [address] Phone: [phone]   Fax:  [phone]

## 2023-12-02 NOTE — Telephone Encounter (Signed)
-----   Message from [place] Dohmeier sent at 11/25/2023  8:17 AM EDT ----- BUN was high- likely not well hydrated.  Please drink more water  !  Glucose was drawn non-fasting state - not valid , but HBa1c is creeping up- 5.8 , prediabetic range.  The TSH was very, very low- thyroid  hyperfunction? Free Thyroid  T 4 was normal at 1.7.  None of the inflammatory markers were elevated.  ----- Message ----- From: Rebecka Memos Lab Results In Sent: 11/23/2023   5:01 AM EDT To: Brenda Gores, MD

## 2023-12-02 NOTE — Telephone Encounter (Signed)
-----   Message from Idalia Dohmeier sent at 12/02/2023  3:21 PM EDT ----- Please inform Mrs Naves to to increase protein in her diet, reduce carbs and keep hydrating  with water .  Her Primary internist (Dr Nichole) needs to address the thyroid  function. The results were already shared with Dr Nichole.  ----- Message ----- From: Shona Rojean LABOR, CMA Sent: 12/02/2023   2:06 PM EDT To: Dedra Gores, MD  ----- Message from Rojean LABOR Shona, CMA sent at 12/02/2023  2:06 PM EDT -----  Do you have a plan of care moving forward  for patient due to lab work results .  ----- Message ----- From: Gores Dedra, MD Sent: 11/25/2023   8:17 AM EDT To: Garnette Nichole, MD; Gna-Pod 3 Results  BUN was high- likely not well hydrated.  Please drink more water  !  Glucose was drawn non-fasting state - not valid , but HBa1c is creeping up- 5.8 , prediabetic range.  The TSH was very, very low- thyroid  hyperfunction? Free Thyroid  T 4 was normal at 1.7.  None of the inflammatory markers were elevated.  ----- Message ----- From: Rebecka Memos Lab Results In Sent: 11/23/2023   5:01 AM EDT To: Dedra Gores, MD

## 2023-12-02 NOTE — Telephone Encounter (Signed)
 LVM for pt to call back and discuss labwork results

## 2023-12-07 ENCOUNTER — Other Ambulatory Visit (HOSPITAL_COMMUNITY): Payer: Self-pay | Admitting: Family Medicine

## 2023-12-07 ENCOUNTER — Ambulatory Visit (HOSPITAL_BASED_OUTPATIENT_CLINIC_OR_DEPARTMENT_OTHER)
Admission: RE | Admit: 2023-12-07 | Discharge: 2023-12-07 | Disposition: A | Discharge status: Home / Self Care | Source: Ambulatory Visit | Attending: Family Medicine | Admitting: Family Medicine

## 2023-12-07 DIAGNOSIS — S0992XA Unspecified injury of nose, initial encounter: Secondary | ICD-10-CM | POA: Insufficient documentation

## 2023-12-07 NOTE — Telephone Encounter (Addendum)
 Spoke to patient gave lab results Pt aware to call pcp for thyroid  results  Pt aware PCP was already sent labwork results . Pt states drinks 4 bottles of S [place] daily Pt states 25 oz per bottle . Patient thanked me for calling

## 2023-12-09 ENCOUNTER — Encounter (HOSPITAL_BASED_OUTPATIENT_CLINIC_OR_DEPARTMENT_OTHER): Payer: Self-pay | Admitting: Physical Therapy

## 2023-12-09 ENCOUNTER — Ambulatory Visit (HOSPITAL_BASED_OUTPATIENT_CLINIC_OR_DEPARTMENT_OTHER): Admitting: Physical Therapy

## 2023-12-09 DIAGNOSIS — G8929 Other chronic pain: Secondary | ICD-10-CM

## 2023-12-09 DIAGNOSIS — R2681 Unsteadiness on feet: Secondary | ICD-10-CM

## 2023-12-09 DIAGNOSIS — M5459 Other low back pain: Secondary | ICD-10-CM | POA: Diagnosis not present

## 2023-12-09 DIAGNOSIS — M6281 Muscle weakness (generalized): Secondary | ICD-10-CM

## 2023-12-09 NOTE — Therapy (Signed)
 OUTPATIENT PHYSICAL THERAPY THORACOLUMBAR TREATMENT   Patient Name: [name] MRN: [id number] DOB:08-29-1940, 83 y.o., female Today's Date: 12/09/2023  END OF SESSION:  PT End of Session - 12/09/23 1314     Visit Number 5    Number of Visits 16    Date for Recertification  01/15/24    Authorization Type aetna Mcr    Progress Note Due on Visit 10    PT Start Time 1315    PT Stop Time 1330    PT Time Calculation (min) 15 min    Activity Tolerance Patient tolerated treatment well    Behavior During Therapy [facility] for tasks assessed/performed            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   Hyperlipidemia    Impaired hearing    left ear, wears hearing aids   Insomnia    circadian rhythm component   Insomnia    Migraine 11/1986   Spillman   Nuclear sclerosis    OSA on CPAP 03/2005   uses cpap setting of 10   Osteoarthritis 2007   Deveschwar   Pain last week   left under breast pain    Plantar fasciitis    Rheumatic fever    Scarlet fever as child   Sleep apnea    Swallowing difficulty    Thyroid  disorder    Vitamin D  deficiency    Vitreous degeneration of right eye    Past Surgical History:  Procedure Laterality Date   BREAST BIOPSY Left 6/00   Hardcastle   BREAST EXCISIONAL BIOPSY Left 1998   DE QUERVAIN'S RELEASE Right 10/97   Sypher   left breast biopsy     ROOT CANAL  02/11/2012   TONSILLECTOMY  age 39   TONSILLECTOMY AND ADENOIDECTOMY  1952   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   TOTAL KNEE ARTHROPLASTY Right 11/08/2012   Procedure: RIGHT TOTAL KNEE ARTHROPLASTY;  Surgeon: Dempsey LULLA Moan, MD;  Location: [facility];  Service: Orthopedics;  Laterality: Right;   TUBAL LIGATION     Patient Active Problem  List   Diagnosis Date Noted   Venous stasis of lower extremity 07/28/2023   Great toe pain, right 06/16/2023   Acute pain of right knee 05/28/2023   Closed compression fracture of body of first lumbar vertebra (HCC) 05/28/2023   Bilateral lower extremity edema 02/03/2022   Fluid retention 12/17/2021   Low back pain 10/03/2021   Atherosclerotic heart disease of native coronary artery without angina pectoris 05/27/2021   Diastolic dysfunction 05/27/2021   Neck pain 05/27/2021   Migraine 04/23/2021   Generalized obesity 04/23/2021   Depression 03/18/2021   Lumbar spine pain 08/30/2020   Flatulence, eructation and gas pain 05/07/2020   Change in bowel habit 05/07/2020   Gout 06/22/2019   Post viral syndrome 06/15/2019   Cervicalgia 06/15/2019   Chronic tension-type headache, intractable 06/15/2019   Fall 04/21/2018   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   Diverticulosis 12/23/2017   History of adenomatous polyp of colon 12/23/2017   Recurrent falls 12/14/2017   Degenerative disc disease, cervical 12/14/2017   Chronic constipation 12/14/2017   Floaters in visual field, bilateral 08/10/2017  Head injury consultation 08/10/2017   Arthritis of carpometacarpal [facility]) joint of right thumb 05/14/2017   Therapeutic opioid-induced constipation (OIC) 02/05/2017   BMI 29.0-29.9,adult 02/05/2017   Dry mouth 02/05/2017   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/08/2017   Intolerance of continuous positive airway pressure (CPAP) ventilation 01/08/2017   Benign neoplasm of colon 11/11/2016   Other long term (current) drug therapy 11/11/2016   History of total knee arthroplasty, 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   Cough 03/06/2016   At risk for injury related to fall 02/14/2016   Primary osteoarthritis of both hands 02/04/2016   Osteopenia of multiple sites 02/04/2016   Metatarsalgia of both feet 02/04/2016   Migraines 02/04/2016   Other fatigue 02/01/2016   Melena 08/20/2015   Acute recurrent  maxillary sinusitis 08/20/2015   Slow transit constipation 08/20/2015   Vitamin D  deficiency 08/20/2015   Thyroid  activity decreased 08/20/2015   Cephalalgia 08/20/2015   Preop cardiovascular exam 07/20/2015   Subacute ethmoidal sinusitis 12/11/2014   Chronic infection of sinus 07/12/2014   Nasal septal ulcer 05/25/2014   DJD (degenerative joint disease), cervical 03/22/2014   Right hand pain 03/22/2014   Deflected nasal septum 01/30/2014   Hypertrophy of nasal turbinates 01/30/2014   UARS (upper airway resistance syndrome) 10/04/2013   Insomnia secondary to depression with anxiety 10/04/2013   Exertional dyspnea 08/20/2013   History of rheumatic fever 08/20/2013   Hypomania (mild) single episode or unspecified 08/09/2013   OSA on CPAP 04/06/2013   Postoperative anemia due to acute blood loss 11/09/2012   OA (osteoarthritis) of knee 11/08/2012   History of giardia infection 07/15/2012   Atrophic vaginitis 07/15/2012   Vitreous degeneration of right eye    Nuclear sclerosis    Right shoulder pain 06/18/2011   Foot pain, left 06/18/2011   Hyperlipidemia 03/24/2011   Disorder of bone 02/12/2009   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   Migraine, unspecified, not intractable, without status migrainosus 02/12/2009    PCP: Garnette Ore MD  REFERRING PROVIDER: Helene Haddock MD  REFERRING DIAG:  [phone] (ICD-10-CM) - Acute pain of right knee  S32.010A (ICD-10-CM) - Closed compression fracture of body of L1 vertebra (HCC)    Rationale for Evaluation and Treatment: Rehabilitation  THERAPY DIAG:  Other low back pain  Muscle weakness (generalized)  Chronic pain of right knee  Unsteadiness on feet  ONSET DATE: March 2025  SUBJECTIVE:                                                                                                                                                                                           SUBJECTIVE STATEMENT: Pt recently had  fall on 10/113.  She tripped over a pair of shoes. She landed forward and had CT scan. Orbital fracture ruled out but does have nasal fracture. Pt does have ENT f/u on Monday morning. Pt reports have a HA of 4/6 continuously with a new bone density medication. Pt does have light sensitivity with increase in symptoms in bright sunlight and fluorescent lighting. Denies nausea/vomiting. Denies significant memory changes.  Reports increased blurred vision.   Pt does feel she is stronger in general. She was able to get herself off the ground while she was home alone during the fall.    EvalBETHA Rasmussen in June. Have had  2 compression fx.    2 falls in one day early march 1st compression fx then again in June sustaining a second.  Has a membership here at [facility]. I have been Walking in pool 4 x week for 45 mins to 1 hr. But I still hurt. I want to work on my balance and stand up from a chair without using my arms.  Standing up from a chair my right knee and back hurts. Using rollator when walking back to pool but not any other time  PERTINENT HISTORY:  Evaluate and treat for L1 compression fx and right knee pain. Include aquatic therapy in treatment plan.  Chronic fatigue; fibromyalgia  PAIN:  Are you having pain? no: NPRS scale: 0/10 - took medication prior to session. Pain location:  Pain description:  Aggravating factors: transfering STS; in and out of bed; in and out of car Relieving factors: rest  PRECAUTIONS: Fall  RED FLAGS: None   WEIGHT BEARING RESTRICTIONS: No  FALLS:  Has patient fallen in last 6 months? Yes. Number of falls 1 fall backwards after putting my foot on a chair to toe my shoe.  LIVING ENVIRONMENT: Lives with: lives with their spouse Lives in: House/apartment Has following equipment at home: Single point cane, Environmental consultant - 2 wheeled, Environmental consultant - 4 wheeled, and Grab bars  OCCUPATION: retired Financial controller  PLOF: Independent  PATIENT GOALS: Improve balance, get in and out of chairs,  car and bed better  NEXT MD VISIT: as needed  OBJECTIVE:  Note: Objective measures were completed at Evaluation unless otherwise noted.  DIAGNOSTIC FINDINGS:  7/25 CT Lumbar IMPRESSION: 1. Subacute compression deformities at T12 and L1 with loss of height of 10% at each level. Sclerotic change consistent with ongoing healing process. I cannot state by CT if there is complete healing. No retropulsed bone. 2. L3-4: Mild disc bulge and facet osteoarthritis. No compressive stenosis. 3. L4-5: Mild disc bulge. Bilateral facet degeneration and hypertrophy. 1-2 mm of degenerative anterolisthesis. Mild narrowing of both lateral recesses, not grossly compressive. 4. L5-S1: Endplate osteophytes and bulging of the disc. Bilateral facet osteoarthritis worse on the right. No compressive stenosis.  05/29/23 Xray R knee IMPRESSION: 1. 9.3 x 3.9 x 3.7 cm heterogenous layering hypodense fluid collection most compatible with a hematoma extending along the superficial fascia of the right posterolateral thigh, overlying and compressing the biceps femoris musculature, which could represent a soft tissue degloving injury, such as a Morel-Lavallee lesion. This collection appears to continue below the level of the knee into the lateral gastrocnemius muscle, concerning for intramuscular hematoma. 2. Intact right total knee arthroplasty. No acute osseous abnormality. 3. Small knee joint effusion.  PATIENT SURVEYS:  ODI  COGNITION: Overall cognitive status: Within functional limits for tasks assessed     SENSATION: WFL  MUSCLE LENGTH: Hamstrings: wfl   POSTURE:  rounded shoulders, forward head, decreased thoracic kyphosis, and left pelvic obliquity  PALPATION: Right patella crepitus  LUMBAR ROM:   AROM eval  Flexion full  Extension   Right lateral flexion 90% limited P!  Left lateral flexion 90% limited P!  Right rotation   Left rotation    (Blank rows = not tested)  LOWER EXTREMITY  ROM:     Active  Right eval Left eval  Hip flexion    Hip extension    Hip abduction    Hip adduction    Hip internal rotation    Hip external rotation    Knee flexion 110 128  Knee extension 0 0  Ankle dorsiflexion    Ankle plantarflexion    Ankle inversion    Ankle eversion     (Blank rows = not tested)  LOWER EXTREMITY MMT:    MMT Right eval Left eval  Hip flexion 3 3  Hip extension    Hip abduction 4+ 4+  Hip adduction 4- 4-  Hip internal rotation    Hip external rotation    Knee flexion 4 5  Knee extension 3+ 4  Ankle dorsiflexion    Ankle plantarflexion    Ankle inversion    Ankle eversion     (Blank rows = not tested)  LUMBAR SPECIAL TESTS:  Slump test: Negative  FUNCTIONAL TESTS:  30 seconds chair stand test Timed up and go (TUG): 21.24   Item Test date: 11/05/23 Date:  Date:   Sitting to standing 2. able to stand using hands after several tries Insert SmartPhrase OPRCBERGREEVAL Insert SmartPhrase OPRCBERGREEVAL  2. Standing unsupported 4. able to stand safely for 2 minutes    3. Sitting with back /unsupported, feet supported 4. able to sit safely and securely for 2 minutes    4. Standing to sitting 2. uses back of legs against chair to control descent    5. Pivot transfer  3. able to transfer safely with definite need of hands    6. Standing unsupported with eyes closed 2. able to stand 3 seconds    7. Standing unsupported with feet together 1. needs help to attain position but able to stand 15 seconds feet together    8. Reaching forward with outstretched arms while standing 2. can reach forward 5 cm (2 inches)    9. Pick up object from the floor from standing 3. able to pick up slipper but needs supervision    10. Turning to look behind over left and right shoulders while standing 3. looks behind one side only, other side shows less weight shift    11. Turn 360 degrees 1. needs close supervision or verbal cuing    12. Place alternate foot on step or  stool while standing unsupported 0. needs assistance to keep from falling/unable to try    13. Standing unsupported one foot in front 0. loses balance while stepping or standing    14. Standing on one leg 0. unable to try of needs assist to prevent fall      Total Score 27/56 Total Score:    Total Score:     GAIT: Distance walked: 400 ft Assistive device utilized: rollator Level of assistance: Complete Independence Comments: quickened short step length, reduced hip/knee flex, little arm swing  TREATMENT   10/15  General concussion symptoms management, exercising under symptoms threshold, precautions/safety with current HEP  [facility] Adult PT Treatment:  Date: 11/26/23 Pt seen for aquatic therapy today.  Treatment took place in water  3.5-4.75 ft in depth at the [facility] pool. Temp of water  was 91.  Pt entered/exited the pool via stairs with bil rail.  - unsupported (4 ft) walking forward/ backward, cues for vertical trunk, increased Rt step height, equal step length - unsupported side stepping with arm add/abdct -> with rainbow hand floats - cues for neutral Rt foot - suitcase carry with bil / single rainbow hand float, walking forward / backward - tandem stance with horz abdct/add with rainbow hand floats (good challenge) - tandem gait forward/ backward with rainbow hand floats (good balance challenge) - wall push up/off for reactive balance  - alternating toe taps to 1st step x 10, without UE support (good balance challenge in Rt SLS)  Pt requires the buoyancy and hydrostatic pressure of water  for support, and to offload joints by unweighting joint load by at least 50 % in navel deep water  and by at least 75-80% in chest to neck deep water .  Viscosity of the water  is needed for resistance of strengthening. Water  current perturbations provides challenge to standing balance requiring increased core activation.    11/17/23  Partial  range bridges 2x10 PPT x15 Single leg clamshells red TB + TA set x10 B SAQs 4# x12 B  Wide tandem stance 3x30 seconds Narrow BOS solid surface 3x30 seconds  Nustep L5x6 minutes seat 9 BLEs only   9/15  Supine PPT 2x10 2s PPT with bridge 2x10 LTR 20x Supine piriformis 30s 3x each STS from elevated table 3x8  Daily walking program of at least 10 min per day   Eval Self care:Posture and body mechanic instruction; grab bars for commode; shower. STS transfers from chairs in restaurants; use of AD; safety precautions to avoid falls                                                                                                                             PATIENT EDUCATION:  Education details: intro to aquatic therapy   Person educated: Patient Education method: Explanation, demonstration Education comprehension: verbalized understanding  HOME EXERCISE PROGRAM: Access Code: [email or web address] URL: [email or web address] Date: 11/09/2023 Prepared by: Dale Call  ASSESSMENT:  CLINICAL IMPRESSION: Pt presents to session today following fall on 10/13. Pt has been cleared of orbital fracture but does have potential nasal fracture. Pt has ENT visit scheduled. Pt does have signs of mild concussion. Exercise held today and exercise safety/precautions discussed in the setting of fracture, falls risk, and concussion. Plan to return to therapy after MD visit and clearance.     Eval: Patient is an 83 y.o. f who was seen today for physical therapy evaluation and treatment for back and right knee pain. PmHx includes bilater TKR >8-10 yrs ago and recent falls onto right knee sustaining T12 and L1 compression fx (earlier this year).  She is a member here at [facility] and comes 4 days a week  and walks submerged x 45 -60 mins.   She presents with pain limited deficits in mid to lb and right knee  effecting strength, activity tolerance, gait, balance, and functional mobility with ADL's.  Specifically she reports most difficulty with all transfers STS; sup to sit and getting out of car. Pt and cg given information on various grab bars for bed, commode and shower encouraging increased safety at home. As indicated by subjective information, and objective and outcome measure score, deficits identified are affecting and limiting overall functional mobility.    OBJECTIVE IMPAIRMENTS: Abnormal gait, decreased activity tolerance, decreased balance, decreased knowledge of use of DME, decreased mobility, difficulty walking, decreased ROM, decreased strength, postural dysfunction, obesity, and pain.   ACTIVITY LIMITATIONS: carrying, lifting, bending, sitting, standing, squatting, stairs, and transfers  PARTICIPATION LIMITATIONS: meal prep, cleaning, laundry, driving, shopping, and community activity  PERSONAL FACTORS: Age and 1-2 comorbidities: see PmHx are also affecting patient's functional outcome.   REHAB POTENTIAL: Good  CLINICAL DECISION MAKING: Evolving/moderate complexity  EVALUATION COMPLEXITY: Moderate   GOALS: Goals reviewed with patient? Yes  SHORT TERM GOALS: Target date: 12/14/23 (delay in beginning therapy)  Pt will tolerate full aquatic sessions consistently without increase in pain and with improving function to demonstrate good toleration and effectiveness of intervention.  Baseline: Goal status: INITIAL  2.  Pt will tolerate stair climbing in and out of pool using approp pattern ascending and descending 6 steps with use of handrail  Baseline:  Goal status: INITIAL  3.  Pt will perform 10 STS from water  bench onto water  step unsupported without LOB Baseline:  Goal status: INITIAL  4.  Pt and cg will consider grab bars for commode  Baseline:  Goal status: INITIAL  5.  Pt will be indep and compliant with initial land based HEP for improving strength and safety at home Baseline:  Goal status: INITIAL    LONG TERM GOALS: Target date: 01/15/24  Pt to  improve on ODI by 13% to demonstrate statistically significant Improvement in function. (MCID 13-15%) Baseline: 23/50=46% Goal status: INITIAL  2.  Pt will improve strength in bil hip flex and right knee ext by 1 grade to demonstrate improved overall physical function Baseline: see chart Goal status: INITIAL  3.  Pt will improve on Berg balance test to >/= 35/56 to demonstrate a decrease in fall risk. (MCD 7) Baseline:27/56 (medium fall risk)  Goal status: INITIAL  4.  Pt will improve on 30s STS test  from standard chair from 3 to 6 to demonstrate improving functional lower extremity strength, transitional movements, and balance. (MDC = 4.2sec)  Baseline: 3 Goal status: INITIAL  5.  Pt will report decrease in pain by at least 50% for improved toleration to activity/quality of life and to demonstrate improved management of pain. Baseline: see chart Goal status: INITIAL  6.  Pt will be indep with final HEP's (land and aquatic as appropriate) for continued management of condition Baseline: see chart Goal status: INITIAL  PLAN:  PT FREQUENCY: 2x/week  PT DURATION: 10 w(extended out due to scheduling conflicts) Initial 8 sessions aquatic with 1 Land based for instruction on land based initial HEP. Then last 7-8 alternate  PLANNED INTERVENTIONS: 97164- PT Re-evaluation, 97750- Physical Performance Testing, 97110-Therapeutic exercises, 97530- Therapeutic activity, 97112- Neuromuscular re-education, 97535- Self Care, [id number]- Manual therapy, [phone]- Gait training, [phone]- Aquatic Therapy, [phone]- Ionotophoresis 4mg /ml Dexamethasone , 79439 (1-2 muscles), [id number] (3+ muscles)- Dry Needling, Patient/Family education, Balance training, Stair training, Taping, Joint mobilization, DME instructions, Cryotherapy,  and Moist heat.  PLAN FOR NEXT SESSION: aquatic: core and le strengthening, transfers, stair climbing, balance retraining  Land:  hip and knee strength, balance, endurance training, gait  training  Dale Call PT, DPT 12/09/23 1:43 PM

## 2023-12-11 ENCOUNTER — Ambulatory Visit (HOSPITAL_BASED_OUTPATIENT_CLINIC_OR_DEPARTMENT_OTHER): Admitting: Physical Therapy

## 2023-12-15 ENCOUNTER — Ambulatory Visit (HOSPITAL_BASED_OUTPATIENT_CLINIC_OR_DEPARTMENT_OTHER): Admitting: Physical Therapy

## 2023-12-15 ENCOUNTER — Encounter (HOSPITAL_BASED_OUTPATIENT_CLINIC_OR_DEPARTMENT_OTHER): Payer: Self-pay | Admitting: Physical Therapy

## 2023-12-15 DIAGNOSIS — G8929 Other chronic pain: Secondary | ICD-10-CM

## 2023-12-15 DIAGNOSIS — R2681 Unsteadiness on feet: Secondary | ICD-10-CM

## 2023-12-15 DIAGNOSIS — M5459 Other low back pain: Secondary | ICD-10-CM

## 2023-12-15 DIAGNOSIS — M6281 Muscle weakness (generalized): Secondary | ICD-10-CM

## 2023-12-15 NOTE — Therapy (Signed)
 OUTPATIENT PHYSICAL THERAPY THORACOLUMBAR TREATMENT   Patient Name: [name] MRN: [id number] DOB:[birth year], 83 y.o., female Today's Date: 12/15/2023  END OF SESSION:  PT End of Session - 12/15/23 1558     Visit Number 6    Number of Visits 16    Date for Recertification  01/15/24    Authorization Type aetna Mcr    Progress Note Due on Visit 10    PT Start Time 1528    PT Stop Time 1608    PT Time Calculation (min) 40 min    Activity Tolerance Patient tolerated treatment well    Behavior During Therapy [name] for tasks assessed/performed             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   [name]   Chronic fatigue    Constipation    Depression    Depression    Fibromyalgia 09/1996   [name]   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   Hyperlipidemia    Impaired hearing    left ear, wears hearing aids   Insomnia    circadian rhythm component   Insomnia    Migraine 11/1986   [name]   Nuclear sclerosis    OSA on CPAP 03/2005   uses cpap setting of 10   Osteoarthritis 2007   [name]   Pain last week   left under breast pain    Plantar fasciitis    Rheumatic fever    Scarlet fever as child   Sleep apnea    Swallowing difficulty    Thyroid  disorder    Vitamin D  deficiency    Vitreous degeneration of right eye    Past Surgical History:  Procedure Laterality Date   BREAST BIOPSY Left 6/00   [name]   BREAST EXCISIONAL BIOPSY Left 1998   DE QUERVAIN'S RELEASE Right 10/97   [name]   left breast biopsy     ROOT CANAL  02/11/2012   TONSILLECTOMY  age 37   TONSILLECTOMY AND ADENOIDECTOMY  1952   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   TOTAL KNEE ARTHROPLASTY Right 11/08/2012   Procedure: RIGHT TOTAL KNEE ARTHROPLASTY;  Surgeon: Brenda LULLA Moan, MD;  Location: [facility];  Service: Orthopedics;  Laterality: Right;   TUBAL LIGATION     Patient Active Problem  List   Diagnosis Date Noted   Venous stasis of lower extremity 07/28/2023   Great toe pain, right 06/16/2023   Acute pain of right knee 05/28/2023   Closed compression fracture of body of first lumbar vertebra (HCC) 05/28/2023   Bilateral lower extremity edema 02/03/2022   Fluid retention 12/17/2021   Low back pain 10/03/2021   Atherosclerotic heart disease of native coronary artery without angina pectoris 05/27/2021   Diastolic dysfunction 05/27/2021   Neck pain 05/27/2021   Migraine 04/23/2021   Generalized obesity 04/23/2021   Depression 03/18/2021   Lumbar spine pain 08/30/2020   Flatulence, eructation and gas pain 05/07/2020   Change in bowel habit 05/07/2020   Gout 06/22/2019   Post viral syndrome 06/15/2019   Cervicalgia 06/15/2019   Chronic tension-type headache, intractable 06/15/2019   Fall 04/21/2018   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   Diverticulosis 12/23/2017   History of adenomatous polyp of colon 12/23/2017   Recurrent falls 12/14/2017   Degenerative disc disease, cervical 12/14/2017   Chronic constipation 12/14/2017   Floaters in visual field, bilateral 08/10/2017  Head injury consultation 08/10/2017   Arthritis of carpometacarpal [facility]) joint of right thumb 05/14/2017   Therapeutic opioid-induced constipation (OIC) 02/05/2017   BMI 29.0-29.9,adult 02/05/2017   Dry mouth 02/05/2017   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/08/2017   Intolerance of continuous positive airway pressure (CPAP) ventilation 01/08/2017   Benign neoplasm of colon 11/11/2016   Other long term (current) drug therapy 11/11/2016   History of total knee arthroplasty, 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   Cough 03/06/2016   At risk for injury related to fall 02/14/2016   Primary osteoarthritis of both hands 02/04/2016   Osteopenia of multiple sites 02/04/2016   Metatarsalgia of both feet 02/04/2016   Migraines 02/04/2016   Other fatigue 02/01/2016   Melena 08/20/2015   Acute recurrent  maxillary sinusitis 08/20/2015   Slow transit constipation 08/20/2015   Vitamin D  deficiency 08/20/2015   Thyroid  activity decreased 08/20/2015   Cephalalgia 08/20/2015   Preop cardiovascular exam 07/20/2015   Subacute ethmoidal sinusitis 12/11/2014   Chronic infection of sinus 07/12/2014   Nasal septal ulcer 05/25/2014   DJD (degenerative joint disease), cervical 03/22/2014   Right hand pain 03/22/2014   Deflected nasal septum 01/30/2014   Hypertrophy of nasal turbinates 01/30/2014   UARS (upper airway resistance syndrome) 10/04/2013   Insomnia secondary to depression with anxiety 10/04/2013   Exertional dyspnea 08/20/2013   History of rheumatic fever 08/20/2013   Hypomania (mild) single episode or unspecified 08/09/2013   OSA on CPAP 04/06/2013   Postoperative anemia due to acute blood loss 11/09/2012   OA (osteoarthritis) of knee 11/08/2012   History of giardia infection 07/15/2012   Atrophic vaginitis 07/15/2012   Vitreous degeneration of right eye    Nuclear sclerosis    Right shoulder pain 06/18/2011   Foot pain, left 06/18/2011   Hyperlipidemia 03/24/2011   Disorder of bone 02/12/2009   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   Migraine, unspecified, not intractable, without status migrainosus 02/12/2009    PCP: Brenda Ore MD  REFERRING PROVIDER: Helene Haddock MD  REFERRING DIAG:  [phone] (ICD-10-CM) - Acute pain of right knee  S32.010A (ICD-10-CM) - Closed compression fracture of body of L1 vertebra (HCC)    Rationale for Evaluation and Treatment: Rehabilitation  THERAPY DIAG:  Other low back pain  Muscle weakness (generalized)  Chronic pain of right knee  Unsteadiness on feet  ONSET DATE: March 2025  SUBJECTIVE:                                                                                                                                                                                           SUBJECTIVE STATEMENT: Pt reports resolution  of headache  and blurred vision after most recent fall.  Pt reports ENT told her she does have some nasal fractures, but there would be no further treatment.      [name] in June. Have had  2 compression fx.    2 falls in one day early march 1st compression fx then again in June sustaining a second.  Has a membership here at [facility]. I have been Walking in pool 4 x week for 45 mins to 1 hr. But I still hurt. I want to work on my balance and stand up from a chair without using my arms.  Standing up from a chair my right knee and back hurts. Using rollator when walking back to pool but not any other time  PERTINENT HISTORY:  Evaluate and treat for L1 compression fx and right knee pain. Include aquatic therapy in treatment plan.  Chronic fatigue; fibromyalgia  PAIN:  Are you having pain? no: NPRS scale: 0/10 - took medication prior to session. Pain location:  Pain description:  Aggravating factors: transfering STS; in and out of bed; in and out of car Relieving factors: rest  PRECAUTIONS: Fall  RED FLAGS: None   WEIGHT BEARING RESTRICTIONS: No  FALLS:  Has patient fallen in last 6 months? Yes. Number of falls 1 fall backwards after putting my foot on a chair to toe my shoe.  LIVING ENVIRONMENT: Lives with: lives with their spouse Lives in: House/apartment Has following equipment at home: Single point cane, Environmental consultant - 2 wheeled, Environmental consultant - 4 wheeled, and Grab bars  OCCUPATION: retired Financial controller  PLOF: Independent  PATIENT GOALS: Improve balance, get in and out of chairs, car and bed better  NEXT MD VISIT: as needed  OBJECTIVE:  Note: Objective measures were completed at Evaluation unless otherwise noted.  DIAGNOSTIC FINDINGS:  7/25 CT Lumbar IMPRESSION: 1. Subacute compression deformities at T12 and L1 with loss of height of 10% at each level. Sclerotic change consistent with ongoing healing process. I cannot state by CT if there is complete healing. No retropulsed  bone. 2. L3-4: Mild disc bulge and facet osteoarthritis. No compressive stenosis. 3. L4-5: Mild disc bulge. Bilateral facet degeneration and hypertrophy. 1-2 mm of degenerative anterolisthesis. Mild narrowing of both lateral recesses, not grossly compressive. 4. L5-S1: Endplate osteophytes and bulging of the disc. Bilateral facet osteoarthritis worse on the right. No compressive stenosis.  05/29/23 Xray R knee IMPRESSION: 1. 9.3 x 3.9 x 3.7 cm heterogenous layering hypodense fluid collection most compatible with a hematoma extending along the superficial fascia of the right posterolateral thigh, overlying and compressing the biceps femoris musculature, which could represent a soft tissue degloving injury, such as a Morel-Lavallee lesion. This collection appears to continue below the level of the knee into the lateral gastrocnemius muscle, concerning for intramuscular hematoma. 2. Intact right total knee arthroplasty. No acute osseous abnormality. 3. Small knee joint effusion.  PATIENT SURVEYS:  ODI  COGNITION: Overall cognitive status: Within functional limits for tasks assessed     SENSATION: WFL  MUSCLE LENGTH: Hamstrings: wfl   POSTURE: rounded shoulders, forward head, decreased thoracic kyphosis, and left pelvic obliquity  PALPATION: Right patella crepitus  LUMBAR ROM:   AROM eval  Flexion full  Extension   Right lateral flexion 90% limited P!  Left lateral flexion 90% limited P!  Right rotation   Left rotation    (Blank rows = not tested)  LOWER EXTREMITY ROM:     Active  Right eval Left  eval  Hip flexion    Hip extension    Hip abduction    Hip adduction    Hip internal rotation    Hip external rotation    Knee flexion 110 128  Knee extension 0 0  Ankle dorsiflexion    Ankle plantarflexion    Ankle inversion    Ankle eversion     (Blank rows = not tested)  LOWER EXTREMITY MMT:    MMT Right eval Left eval  Hip flexion 3 3  Hip extension     Hip abduction 4+ 4+  Hip adduction 4- 4-  Hip internal rotation    Hip external rotation    Knee flexion 4 5  Knee extension 3+ 4  Ankle dorsiflexion    Ankle plantarflexion    Ankle inversion    Ankle eversion     (Blank rows = not tested)  LUMBAR SPECIAL TESTS:  Slump test: Negative  FUNCTIONAL TESTS:  30 seconds chair stand test Timed up and go (TUG): 21.24   Item Test date: 11/05/23 Date:  Date:   Sitting to standing 2. able to stand using hands after several tries Insert SmartPhrase OPRCBERGREEVAL Insert SmartPhrase OPRCBERGREEVAL  2. Standing unsupported 4. able to stand safely for 2 minutes    3. Sitting with back /unsupported, feet supported 4. able to sit safely and securely for 2 minutes    4. Standing to sitting 2. uses back of legs against chair to control descent    5. Pivot transfer  3. able to transfer safely with definite need of hands    6. Standing unsupported with eyes closed 2. able to stand 3 seconds    7. Standing unsupported with feet together 1. needs help to attain position but able to stand 15 seconds feet together    8. Reaching forward with outstretched arms while standing 2. can reach forward 5 cm (2 inches)    9. Pick up object from the floor from standing 3. able to pick up slipper but needs supervision    10. Turning to look behind over left and right shoulders while standing 3. looks behind one side only, other side shows less weight shift    11. Turn 360 degrees 1. needs close supervision or verbal cuing    12. Place alternate foot on step or stool while standing unsupported 0. needs assistance to keep from falling/unable to try    13. Standing unsupported one foot in front 0. loses balance while stepping or standing    14. Standing on one leg 0. unable to try of needs assist to prevent fall      Total Score 27/56 Total Score:    Total Score:     GAIT: Distance walked: 400 ft Assistive device utilized: rollator Level of assistance: Complete  Independence Comments: quickened short step length, reduced hip/knee flex, little arm swing  TREATMENT  [facility] Adult PT Treatment:                                             Date: 12/15/23 Pt seen for aquatic therapy today.  Treatment took place in water  3.5-4.75 ft in depth at the [facility] pool. Temp of water  was 91.  Pt entered/exited the pool via stairs with bil rail. * pt completed walking 3 directions in lap pool for ~40 min prior to session   - tandem  gait forward/ backward ->with rainbow hand floats (good balance challenge) - suitcase carry with bil rainbow hand float, marching forward / backward - UE on rainbow hand floats:  3 way toe touch, 2 x 5 each; forward walking kicks x 1 lap; hip abdct/ add x 10  - TrA set with hollow long noodle in standard stance x 8, staggered stance x 5 (difficulty maintaining posture/positioning)  - wall push up/off for reactive balance x 10 - alternating toe taps to 1st step x 10, without UE support (improved) - STS from 3rd step x 2 (required UE support to ascend) -> STS from bench in water  with feet on blue step (improved!) x 6  10/15  General concussion symptoms management, exercising under symptoms threshold, precautions/safety with current HEP  [facility] Adult PT Treatment:                                             Date: 11/26/23 Pt seen for aquatic therapy today.  Treatment took place in water  3.5-4.75 ft in depth at the [facility] pool. Temp of water  was 91.  Pt entered/exited the pool via stairs with bil rail.  - unsupported (4 ft) walking forward/ backward, cues for vertical trunk, increased Rt step height, equal step length - unsupported side stepping with arm add/abdct -> with rainbow hand floats - cues for neutral Rt foot - suitcase carry with bil / single rainbow hand float, walking forward / backward - tandem stance with horz abdct/add with rainbow hand floats (good challenge) - tandem gait forward/ backward with  rainbow hand floats (good balance challenge) - wall push up/off for reactive balance  - alternating toe taps to 1st step x 10, without UE support (good balance challenge in Rt SLS)  Pt requires the buoyancy and hydrostatic pressure of water  for support, and to offload joints by unweighting joint load by at least 50 % in navel deep water  and by at least 75-80% in chest to neck deep water .  Viscosity of the water  is needed for resistance of strengthening. Water  current perturbations provides challenge to standing balance requiring increased core activation.    11/17/23  Partial range bridges 2x10 PPT x15 Single leg clamshells red TB + TA set x10 B SAQs 4# x12 B  Wide tandem stance 3x30 seconds Narrow BOS solid surface 3x30 seconds  Nustep L5x6 minutes seat 9 BLEs only   9/15  Supine PPT 2x10 2s PPT with bridge 2x10 LTR 20x Supine piriformis 30s 3x each STS from elevated table 3x8  Daily walking program of at least 10 min per day   Eval Self care:Posture and body mechanic instruction; grab bars for commode; shower. STS transfers from chairs in restaurants; use of AD; safety precautions to avoid falls  PATIENT EDUCATION:  Education details: intro to aquatic therapy   Person educated: Patient Education method: Explanation, demonstration Education comprehension: verbalized understanding  HOME EXERCISE PROGRAM: Access Code: [email or web address] URL: [email or web address] Date: 11/09/2023 Prepared by: Dale Call  ASSESSMENT:  CLINICAL IMPRESSION: Pt demonstrated improved balance with alternating toe taps to 1st step, but was challenged by tandem gait and 3 way toe tap.  Will continue to progress as tolerated and update aquatic HEP as able.  Therapist to check STG next session as time allows.     Eval: Patient is an 83 y.o. f who was seen today  for physical therapy evaluation and treatment for back and right knee pain. PmHx includes bilater TKR >8-10 yrs ago and recent falls onto right knee sustaining T12 and L1 compression fx (earlier this year).  She is a member here at [facility] and comes 4 days a week and walks submerged x 45 -60 mins.   She presents with pain limited deficits in mid to lb and right knee  effecting strength, activity tolerance, gait, balance, and functional mobility with ADL's. Specifically she reports most difficulty with all transfers STS; sup to sit and getting out of car. Pt and cg given information on various grab bars for bed, commode and shower encouraging increased safety at home. As indicated by subjective information, and objective and outcome measure score, deficits identified are affecting and limiting overall functional mobility.    OBJECTIVE IMPAIRMENTS: Abnormal gait, decreased activity tolerance, decreased balance, decreased knowledge of use of DME, decreased mobility, difficulty walking, decreased ROM, decreased strength, postural dysfunction, obesity, and pain.   ACTIVITY LIMITATIONS: carrying, lifting, bending, sitting, standing, squatting, stairs, and transfers  PARTICIPATION LIMITATIONS: meal prep, cleaning, laundry, driving, shopping, and community activity  PERSONAL FACTORS: Age and 1-2 comorbidities: see PmHx are also affecting patient's functional outcome.   REHAB POTENTIAL: Good  CLINICAL DECISION MAKING: Evolving/moderate complexity  EVALUATION COMPLEXITY: Moderate   GOALS: Goals reviewed with patient? Yes  SHORT TERM GOALS: Target date: 12/14/23 (delay in beginning therapy)  Pt will tolerate full aquatic sessions consistently without increase in pain and with improving function to demonstrate good toleration and effectiveness of intervention.  Baseline: Goal status: INITIAL  2.  Pt will tolerate stair climbing in and out of pool using approp pattern ascending and descending 6 steps  with use of handrail  Baseline:  Goal status: INITIAL  3.  Pt will perform 10 STS from water  bench onto water  step unsupported without LOB Baseline:  Goal status: INITIAL  4.  Pt and cg will consider grab bars for commode  Baseline:  Goal status: INITIAL  5.  Pt will be indep and compliant with initial land based HEP for improving strength and safety at home Baseline:  Goal status: INITIAL    LONG TERM GOALS: Target date: 01/15/24  Pt to improve on ODI by 13% to demonstrate statistically significant Improvement in function. (MCID 13-15%) Baseline: 23/50=46% Goal status: INITIAL  2.  Pt will improve strength in bil hip flex and right knee ext by 1 grade to demonstrate improved overall physical function Baseline: see chart Goal status: INITIAL  3.  Pt will improve on Berg balance test to >/= 35/56 to demonstrate a decrease in fall risk. (MCD 7) Baseline:27/56 (medium fall risk)  Goal status: INITIAL  4.  Pt will improve on 30s STS test  from standard chair from 3 to 6 to demonstrate improving functional lower extremity strength, transitional movements, and balance. (MDC = 4.2sec)  Baseline: 3 Goal  status: INITIAL  5.  Pt will report decrease in pain by at least 50% for improved toleration to activity/quality of life and to demonstrate improved management of pain. Baseline: see chart Goal status: INITIAL  6.  Pt will be indep with final HEP's (land and aquatic as appropriate) for continued management of condition Baseline: see chart Goal status: INITIAL  PLAN:  PT FREQUENCY: 2x/week  PT DURATION: 10 w(extended out due to scheduling conflicts) Initial 8 sessions aquatic with 1 Land based for instruction on land based initial HEP. Then last 7-8 alternate  PLANNED INTERVENTIONS: 97164- PT Re-evaluation, 97750- Physical Performance Testing, 97110-Therapeutic exercises, 97530- Therapeutic activity, 97112- Neuromuscular re-education, 97535- Self Care, 02859- Manual therapy,  [phone]- Gait training, [phone]- Aquatic Therapy, [phone]- Ionotophoresis 4mg /ml Dexamethasone , 79439 (1-2 muscles), 20561 (3+ muscles)- Dry Needling, Patient/Family education, Balance training, Stair training, Taping, Joint mobilization, DME instructions, Cryotherapy, and Moist heat.  PLAN FOR NEXT SESSION: aquatic: core and le strengthening, transfers, stair climbing, balance retraining  Land:  hip and knee strength, balance, endurance training, gait training  Delon Aquas, PTA 12/15/23 5:55 PM [facility] [address] [place], [place], [address] Phone: [phone]   Fax:  [phone]

## 2023-12-18 ENCOUNTER — Encounter (HOSPITAL_BASED_OUTPATIENT_CLINIC_OR_DEPARTMENT_OTHER): Payer: Self-pay | Admitting: Physical Therapy

## 2023-12-18 ENCOUNTER — Ambulatory Visit (HOSPITAL_BASED_OUTPATIENT_CLINIC_OR_DEPARTMENT_OTHER): Admitting: Physical Therapy

## 2023-12-18 DIAGNOSIS — G8929 Other chronic pain: Secondary | ICD-10-CM

## 2023-12-18 DIAGNOSIS — M6281 Muscle weakness (generalized): Secondary | ICD-10-CM

## 2023-12-18 DIAGNOSIS — R2681 Unsteadiness on feet: Secondary | ICD-10-CM

## 2023-12-18 DIAGNOSIS — M5459 Other low back pain: Secondary | ICD-10-CM | POA: Diagnosis not present

## 2023-12-18 NOTE — Therapy (Signed)
 OUTPATIENT PHYSICAL THERAPY THORACOLUMBAR TREATMENT   Patient Name: [name] MRN: [id number] DOB:1940/05/03, 83 y.o., female Today's Date: 12/18/2023  END OF SESSION:  PT End of Session - 12/18/23 1432     Visit Number 7    Number of Visits 16    Date for Recertification  01/15/24    Authorization Type aetna Mcr    Progress Note Due on Visit 10    PT Start Time 1432    PT Stop Time 1511    PT Time Calculation (min) 39 min    Activity Tolerance Patient tolerated treatment well    Behavior During Therapy [facility] for tasks assessed/performed              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   Hyperlipidemia    Impaired hearing    left ear, wears hearing aids   Insomnia    circadian rhythm component   Insomnia    Migraine 11/1986   Spillman   Nuclear sclerosis    OSA on CPAP 03/2005   uses cpap setting of 10   Osteoarthritis 2007   Deveschwar   Pain last week   left under breast pain    Plantar fasciitis    Rheumatic fever    Scarlet fever as child   Sleep apnea    Swallowing difficulty    Thyroid  disorder    Vitamin D  deficiency    Vitreous degeneration of right eye    Past Surgical History:  Procedure Laterality Date   BREAST BIOPSY Left 6/00   Hardcastle   BREAST EXCISIONAL BIOPSY Left 1998   DE QUERVAIN'S RELEASE Right 10/97   Sypher   left breast biopsy     ROOT CANAL  02/11/2012   TONSILLECTOMY  age 78   TONSILLECTOMY AND ADENOIDECTOMY  1952   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   TOTAL KNEE ARTHROPLASTY Right 11/08/2012   Procedure: RIGHT TOTAL KNEE ARTHROPLASTY;  Surgeon: Dempsey LULLA Moan, MD;  Location: [facility];  Service: Orthopedics;  Laterality: Right;   TUBAL LIGATION     Patient Active  Problem List   Diagnosis Date Noted   Venous stasis of lower extremity 07/28/2023   Great toe pain, right 06/16/2023   Acute pain of right knee 05/28/2023   Closed compression fracture of body of first lumbar vertebra (HCC) 05/28/2023   Bilateral lower extremity edema 02/03/2022   Fluid retention 12/17/2021   Low back pain 10/03/2021   Atherosclerotic heart disease of native coronary artery without angina pectoris 05/27/2021   Diastolic dysfunction 05/27/2021   Neck pain 05/27/2021   Migraine 04/23/2021   Generalized obesity 04/23/2021   Depression 03/18/2021   Lumbar spine pain 08/30/2020   Flatulence, eructation and gas pain 05/07/2020   Change in bowel habit 05/07/2020   Gout 06/22/2019   Post viral syndrome 06/15/2019   Cervicalgia 06/15/2019   Chronic tension-type headache, intractable 06/15/2019   Fall 04/21/2018   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   Diverticulosis 12/23/2017   History of adenomatous polyp of colon 12/23/2017   Recurrent falls 12/14/2017   Degenerative disc disease, cervical 12/14/2017   Chronic constipation 12/14/2017   Floaters in visual field, bilateral  08/10/2017   Head injury consultation 08/10/2017   Arthritis of carpometacarpal [facility]) joint of right thumb 05/14/2017   Therapeutic opioid-induced constipation (OIC) 02/05/2017   BMI 29.0-29.9,adult 02/05/2017   Dry mouth 02/05/2017   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/08/2017   Intolerance of continuous positive airway pressure (CPAP) ventilation 01/08/2017   Benign neoplasm of colon 11/11/2016   Other long term (current) drug therapy 11/11/2016   History of total knee arthroplasty, 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   Cough 03/06/2016   At risk for injury related to fall 02/14/2016   Primary osteoarthritis of both hands 02/04/2016   Osteopenia of multiple sites 02/04/2016   Metatarsalgia of both feet 02/04/2016   Migraines 02/04/2016   Other fatigue 02/01/2016   Melena 08/20/2015   Acute  recurrent maxillary sinusitis 08/20/2015   Slow transit constipation 08/20/2015   Vitamin D  deficiency 08/20/2015   Thyroid  activity decreased 08/20/2015   Cephalalgia 08/20/2015   Preop cardiovascular exam 07/20/2015   Subacute ethmoidal sinusitis 12/11/2014   Chronic infection of sinus 07/12/2014   Nasal septal ulcer 05/25/2014   DJD (degenerative joint disease), cervical 03/22/2014   Right hand pain 03/22/2014   Deflected nasal septum 01/30/2014   Hypertrophy of nasal turbinates 01/30/2014   UARS (upper airway resistance syndrome) 10/04/2013   Insomnia secondary to depression with anxiety 10/04/2013   Exertional dyspnea 08/20/2013   History of rheumatic fever 08/20/2013   Hypomania (mild) single episode or unspecified 08/09/2013   OSA on CPAP 04/06/2013   Postoperative anemia due to acute blood loss 11/09/2012   OA (osteoarthritis) of knee 11/08/2012   History of giardia infection 07/15/2012   Atrophic vaginitis 07/15/2012   Vitreous degeneration of right eye    Nuclear sclerosis    Right shoulder pain 06/18/2011   Foot pain, left 06/18/2011   Hyperlipidemia 03/24/2011   Disorder of bone 02/12/2009   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   Migraine, unspecified, not intractable, without status migrainosus 02/12/2009    PCP: Garnette Ore MD  REFERRING PROVIDER: Helene Haddock MD  REFERRING DIAG:  [phone] (ICD-10-CM) - Acute pain of right knee  S32.010A (ICD-10-CM) - Closed compression fracture of body of L1 vertebra (HCC)    Rationale for Evaluation and Treatment: Rehabilitation  THERAPY DIAG:  Other low back pain  Muscle weakness (generalized)  Chronic pain of right knee  Unsteadiness on feet  ONSET DATE: March 2025  SUBJECTIVE:                                                                                                                                                                                           SUBJECTIVE STATEMENT:  Had that fall but its  in the report, 3 falls is enough. Nothing new    Eval:  Fell in June. Have had  2 compression fx.    2 falls in one day early march 1st compression fx then again in June sustaining a second.  Has a membership here at [facility]. I have been Walking in pool 4 x week for 45 mins to 1 hr. But I still hurt. I want to work on my balance and stand up from a chair without using my arms.  Standing up from a chair my right knee and back hurts. Using rollator when walking back to pool but not any other time  PERTINENT HISTORY:  Evaluate and treat for L1 compression fx and right knee pain. Include aquatic therapy in treatment plan.  Chronic fatigue; fibromyalgia  PAIN:  Are you having pain? no: NPRS scale: 5-6/10 Pain location: neck, knees, back  Pain description: fibro vs OA I don't know what  Aggravating factors: transfering STS; in and out of bed; in and out of car Relieving factors: rest  PRECAUTIONS: Fall  RED FLAGS: None   WEIGHT BEARING RESTRICTIONS: No  FALLS:  Has patient fallen in last 6 months? Yes. Number of falls 1 fall backwards after putting my foot on a chair to toe my shoe.  LIVING ENVIRONMENT: Lives with: lives with their spouse Lives in: House/apartment Has following equipment at home: Single point cane, Environmental consultant - 2 wheeled, Environmental consultant - 4 wheeled, and Grab bars  OCCUPATION: retired Financial controller  PLOF: Independent  PATIENT GOALS: Improve balance, get in and out of chairs, car and bed better  NEXT MD VISIT: as needed  OBJECTIVE:  Note: Objective measures were completed at Evaluation unless otherwise noted.  DIAGNOSTIC FINDINGS:  7/25 CT Lumbar IMPRESSION: 1. Subacute compression deformities at T12 and L1 with loss of height of 10% at each level. Sclerotic change consistent with ongoing healing process. I cannot state by CT if there is complete healing. No retropulsed bone. 2. L3-4: Mild disc bulge and facet osteoarthritis. No compressive stenosis. 3. L4-5:  Mild disc bulge. Bilateral facet degeneration and hypertrophy. 1-2 mm of degenerative anterolisthesis. Mild narrowing of both lateral recesses, not grossly compressive. 4. L5-S1: Endplate osteophytes and bulging of the disc. Bilateral facet osteoarthritis worse on the right. No compressive stenosis.  05/29/23 Xray R knee IMPRESSION: 1. 9.3 x 3.9 x 3.7 cm heterogenous layering hypodense fluid collection most compatible with a hematoma extending along the superficial fascia of the right posterolateral thigh, overlying and compressing the biceps femoris musculature, which could represent a soft tissue degloving injury, such as a Morel-Lavallee lesion. This collection appears to continue below the level of the knee into the lateral gastrocnemius muscle, concerning for intramuscular hematoma. 2. Intact right total knee arthroplasty. No acute osseous abnormality. 3. Small knee joint effusion.  PATIENT SURVEYS:  ODI  COGNITION: Overall cognitive status: Within functional limits for tasks assessed     SENSATION: WFL  MUSCLE LENGTH: Hamstrings: wfl   POSTURE: rounded shoulders, forward head, decreased thoracic kyphosis, and left pelvic obliquity  PALPATION: Right patella crepitus  LUMBAR ROM:   AROM eval  Flexion full  Extension   Right lateral flexion 90% limited P!  Left lateral flexion 90% limited P!  Right rotation   Left rotation    (Blank rows = not tested)  LOWER EXTREMITY ROM:     Active  Right eval Left eval  Hip flexion    Hip extension  Hip abduction    Hip adduction    Hip internal rotation    Hip external rotation    Knee flexion 110 128  Knee extension 0 0  Ankle dorsiflexion    Ankle plantarflexion    Ankle inversion    Ankle eversion     (Blank rows = not tested)  LOWER EXTREMITY MMT:    MMT Right eval Left eval  Hip flexion 3 3  Hip extension    Hip abduction 4+ 4+  Hip adduction 4- 4-  Hip internal rotation    Hip external  rotation    Knee flexion 4 5  Knee extension 3+ 4  Ankle dorsiflexion    Ankle plantarflexion    Ankle inversion    Ankle eversion     (Blank rows = not tested)  LUMBAR SPECIAL TESTS:  Slump test: Negative  FUNCTIONAL TESTS:  30 seconds chair stand test Timed up and go (TUG): 21.24   Item Test date: 11/05/23 Date:  Date:   Sitting to standing 2. able to stand using hands after several tries Insert SmartPhrase OPRCBERGREEVAL Insert SmartPhrase OPRCBERGREEVAL  2. Standing unsupported 4. able to stand safely for 2 minutes    3. Sitting with back /unsupported, feet supported 4. able to sit safely and securely for 2 minutes    4. Standing to sitting 2. uses back of legs against chair to control descent    5. Pivot transfer  3. able to transfer safely with definite need of hands    6. Standing unsupported with eyes closed 2. able to stand 3 seconds    7. Standing unsupported with feet together 1. needs help to attain position but able to stand 15 seconds feet together    8. Reaching forward with outstretched arms while standing 2. can reach forward 5 cm (2 inches)    9. Pick up object from the floor from standing 3. able to pick up slipper but needs supervision    10. Turning to look behind over left and right shoulders while standing 3. looks behind one side only, other side shows less weight shift    11. Turn 360 degrees 1. needs close supervision or verbal cuing    12. Place alternate foot on step or stool while standing unsupported 0. needs assistance to keep from falling/unable to try    13. Standing unsupported one foot in front 0. loses balance while stepping or standing    14. Standing on one leg 0. unable to try of needs assist to prevent fall      Total Score 27/56 Total Score:    Total Score:     GAIT: Distance walked: 400 ft Assistive device utilized: rollator Level of assistance: Complete Independence Comments: quickened short step length, reduced hip/knee flex, little arm  swing  TREATMENT  [facility] Adult PT Treatment:                                                 12/18/23   Nustep L5x8 minutes all four extremities for w/u   Tandem stance solid surface 3x30 seconds B  Narrow BOS 4x30 seconds  Tandem walking x3 laps next to table, MinA from PT for balance Lateral weight shifts x60 seconds Narrow BOS blue foam pad 3x30 seconds SLS with one foot on blue yoga block 3x30 seconds B  Date: 12/15/23 Pt seen for aquatic therapy today.  Treatment took place in water  3.5-4.75 ft in depth at the [facility] pool. Temp of water  was 91.  Pt entered/exited the pool via stairs with bil rail. * pt completed walking 3 directions in lap pool for ~40 min prior to session   - tandem gait forward/ backward ->with rainbow hand floats (good balance challenge) - suitcase carry with bil rainbow hand float, marching forward / backward - UE on rainbow hand floats:  3 way toe touch, 2 x 5 each; forward walking kicks x 1 lap; hip abdct/ add x 10  - TrA set with hollow long noodle in standard stance x 8, staggered stance x 5 (difficulty maintaining posture/positioning)  - wall push up/off for reactive balance x 10 - alternating toe taps to 1st step x 10, without UE support (improved) - STS from 3rd step x 2 (required UE support to ascend) -> STS from bench in water  with feet on blue step (improved!) x 6  10/15  General concussion symptoms management, exercising under symptoms threshold, precautions/safety with current HEP  [facility] Adult PT Treatment:                                             Date: 11/26/23 Pt seen for aquatic therapy today.  Treatment took place in water  3.5-4.75 ft in depth at the [facility] pool. Temp of water  was 91.  Pt entered/exited the pool via stairs with bil rail.  - unsupported (4 ft) walking forward/ backward, cues for vertical trunk, increased Rt step height, equal step length - unsupported side stepping with arm  add/abdct -> with rainbow hand floats - cues for neutral Rt foot - suitcase carry with bil / single rainbow hand float, walking forward / backward - tandem stance with horz abdct/add with rainbow hand floats (good challenge) - tandem gait forward/ backward with rainbow hand floats (good balance challenge) - wall push up/off for reactive balance  - alternating toe taps to 1st step x 10, without UE support (good balance challenge in Rt SLS)  Pt requires the buoyancy and hydrostatic pressure of water  for support, and to offload joints by unweighting joint load by at least 50 % in navel deep water  and by at least 75-80% in chest to neck deep water .  Viscosity of the water  is needed for resistance of strengthening. Water  current perturbations provides challenge to standing balance requiring increased core activation.    11/17/23  Partial range bridges 2x10 PPT x15 Single leg clamshells red TB + TA set x10 B SAQs 4# x12 B  Wide tandem stance 3x30 seconds Narrow BOS solid surface 3x30 seconds  Nustep L5x6 minutes seat 9 BLEs only   9/15  Supine PPT 2x10 2s PPT with bridge 2x10 LTR 20x Supine piriformis 30s 3x each STS from elevated table 3x8  Daily walking program of at least 10 min per day   Eval Self care:Posture and body mechanic instruction; grab bars for commode; shower. STS transfers from chairs in restaurants; use of AD; safety precautions to avoid falls  PATIENT EDUCATION:  Education details: intro to aquatic therapy   Person educated: Patient Education method: Explanation, demonstration Education comprehension: verbalized understanding  HOME EXERCISE PROGRAM: Access Code: [email or web address] URL: [email or web address] Date: 11/09/2023 Prepared by: Dale Call  ASSESSMENT:  CLINICAL IMPRESSION:  We worked more on balance today per her  request, she needed up to [facility] for balance today but will benefit from ongoing skilled focus on this area. She's had 3 falls already, would really like to avoid a 4th. Deferred STG check today as many of them were in the water  and this was a land session.     Eval: Patient is an 83 y.o. f who was seen today for physical therapy evaluation and treatment for back and right knee pain. PmHx includes bilater TKR >8-10 yrs ago and recent falls onto right knee sustaining T12 and L1 compression fx (earlier this year).  She is a member here at [facility] and comes 4 days a week and walks submerged x 45 -60 mins.   She presents with pain limited deficits in mid to lb and right knee  effecting strength, activity tolerance, gait, balance, and functional mobility with ADL's. Specifically she reports most difficulty with all transfers STS; sup to sit and getting out of car. Pt and cg given information on various grab bars for bed, commode and shower encouraging increased safety at home. As indicated by subjective information, and objective and outcome measure score, deficits identified are affecting and limiting overall functional mobility.    OBJECTIVE IMPAIRMENTS: Abnormal gait, decreased activity tolerance, decreased balance, decreased knowledge of use of DME, decreased mobility, difficulty walking, decreased ROM, decreased strength, postural dysfunction, obesity, and pain.   ACTIVITY LIMITATIONS: carrying, lifting, bending, sitting, standing, squatting, stairs, and transfers  PARTICIPATION LIMITATIONS: meal prep, cleaning, laundry, driving, shopping, and community activity  PERSONAL FACTORS: Age and 1-2 comorbidities: see PmHx are also affecting patient's functional outcome.   REHAB POTENTIAL: Good  CLINICAL DECISION MAKING: Evolving/moderate complexity  EVALUATION COMPLEXITY: Moderate   GOALS: Goals reviewed with patient? Yes  SHORT TERM GOALS: Target date: 12/14/23 (delay in beginning therapy)  Pt  will tolerate full aquatic sessions consistently without increase in pain and with improving function to demonstrate good toleration and effectiveness of intervention.  Baseline: Goal status: INITIAL  2.  Pt will tolerate stair climbing in and out of pool using approp pattern ascending and descending 6 steps with use of handrail  Baseline:  Goal status: INITIAL  3.  Pt will perform 10 STS from water  bench onto water  step unsupported without LOB Baseline:  Goal status: INITIAL  4.  Pt and cg will consider grab bars for commode  Baseline:  Goal status: INITIAL  5.  Pt will be indep and compliant with initial land based HEP for improving strength and safety at home Baseline:  Goal status: INITIAL    LONG TERM GOALS: Target date: 01/15/24  Pt to improve on ODI by 13% to demonstrate statistically significant Improvement in function. (MCID 13-15%) Baseline: 23/50=46% Goal status: INITIAL  2.  Pt will improve strength in bil hip flex and right knee ext by 1 grade to demonstrate improved overall physical function Baseline: see chart Goal status: INITIAL  3.  Pt will improve on Berg balance test to >/= 35/56 to demonstrate a decrease in fall risk. (MCD 7) Baseline:27/56 (medium fall risk)  Goal status: INITIAL  4.  Pt will improve on 30s STS test  from standard chair from 3 to 6 to demonstrate improving functional lower  extremity strength, transitional movements, and balance. (MDC = 4.2sec)  Baseline: 3 Goal status: INITIAL  5.  Pt will report decrease in pain by at least 50% for improved toleration to activity/quality of life and to demonstrate improved management of pain. Baseline: see chart Goal status: INITIAL  6.  Pt will be indep with final HEP's (land and aquatic as appropriate) for continued management of condition Baseline: see chart Goal status: INITIAL  PLAN:  PT FREQUENCY: 2x/week  PT DURATION: 10 w(extended out due to scheduling conflicts) Initial 8 sessions  aquatic with 1 Land based for instruction on land based initial HEP. Then last 7-8 alternate  PLANNED INTERVENTIONS: [id number]- PT Re-evaluation, 97750- Physical Performance Testing, 97110-Therapeutic exercises, 97530- Therapeutic activity, 97112- Neuromuscular re-education, 97535- Self Care, [id number]- Manual therapy, [phone]- Gait training, [phone]- Aquatic Therapy, [phone]- Ionotophoresis 4mg /ml Dexamethasone , 79439 (1-2 muscles), 20561 (3+ muscles)- Dry Needling, Patient/Family education, Balance training, Stair training, Taping, Joint mobilization, DME instructions, Cryotherapy, and Moist heat.  PLAN FOR NEXT SESSION: aquatic: core and le strengthening, transfers, stair climbing, balance retraining  Land:  hip and knee strength, balance work, Teaching laboratory technician, gait training  Josette Rough, PT, DPT 12/18/23 3:13 PM

## 2023-12-22 ENCOUNTER — Encounter (HOSPITAL_BASED_OUTPATIENT_CLINIC_OR_DEPARTMENT_OTHER): Payer: Self-pay | Admitting: Physical Therapy

## 2023-12-22 ENCOUNTER — Ambulatory Visit (HOSPITAL_BASED_OUTPATIENT_CLINIC_OR_DEPARTMENT_OTHER): Admitting: Physical Therapy

## 2023-12-22 DIAGNOSIS — G8929 Other chronic pain: Secondary | ICD-10-CM

## 2023-12-22 DIAGNOSIS — M5459 Other low back pain: Secondary | ICD-10-CM | POA: Diagnosis not present

## 2023-12-22 DIAGNOSIS — M6281 Muscle weakness (generalized): Secondary | ICD-10-CM

## 2023-12-22 DIAGNOSIS — R2681 Unsteadiness on feet: Secondary | ICD-10-CM

## 2023-12-22 NOTE — Therapy (Signed)
 OUTPATIENT PHYSICAL THERAPY THORACOLUMBAR TREATMENT   Patient Name: [name] MRN: [id number] DOB:11-02-1940, 83 y.o., female Today's Date: 12/22/2023  END OF SESSION:  PT End of Session - 12/22/23 1305     Visit Number 8    Number of Visits 16    Date for Recertification  01/15/24    Authorization Type aetna Mcr    Progress Note Due on Visit 10    PT Start Time 1100    PT Stop Time 1140    PT Time Calculation (min) 40 min    Activity Tolerance Patient tolerated treatment well    Behavior During Therapy [facility] for tasks assessed/performed         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   Hyperlipidemia    Impaired hearing    left ear, wears hearing aids   Insomnia    circadian rhythm component   Insomnia    Migraine 11/1986   Spillman   Nuclear sclerosis    OSA on CPAP 03/2005   uses cpap setting of 10   Osteoarthritis 2007   Deveschwar   Pain last week   left under breast pain    Plantar fasciitis    Rheumatic fever    Scarlet fever as child   Sleep apnea    Swallowing difficulty    Thyroid  disorder    Vitamin D  deficiency    Vitreous degeneration of right eye    Past Surgical History:  Procedure Laterality Date   BREAST BIOPSY Left 6/00   Hardcastle   BREAST EXCISIONAL BIOPSY Left 1998   DE QUERVAIN'S RELEASE Right 10/97   Sypher   left breast biopsy     ROOT CANAL  02/11/2012   TONSILLECTOMY  age 25   TONSILLECTOMY AND ADENOIDECTOMY  1952   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   TOTAL KNEE ARTHROPLASTY Right 11/08/2012   Procedure: RIGHT TOTAL KNEE ARTHROPLASTY;  Surgeon: Dempsey LULLA Moan, MD;  Location: [facility];  Service: Orthopedics;  Laterality: Right;   TUBAL LIGATION     Patient Active Problem List    Diagnosis Date Noted   Venous stasis of lower extremity 07/28/2023   Great toe pain, right 06/16/2023   Acute pain of right knee 05/28/2023   Closed compression fracture of body of first lumbar vertebra (HCC) 05/28/2023   Bilateral lower extremity edema 02/03/2022   Fluid retention 12/17/2021   Low back pain 10/03/2021   Atherosclerotic heart disease of native coronary artery without angina pectoris 05/27/2021   Diastolic dysfunction 05/27/2021   Neck pain 05/27/2021   Migraine 04/23/2021   Generalized obesity 04/23/2021   Depression 03/18/2021   Lumbar spine pain 08/30/2020   Flatulence, eructation and gas pain 05/07/2020   Change in bowel habit 05/07/2020   Gout 06/22/2019   Post viral syndrome 06/15/2019   Cervicalgia 06/15/2019   Chronic tension-type headache, intractable 06/15/2019   Fall 04/21/2018   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   Diverticulosis 12/23/2017   History of adenomatous polyp of colon 12/23/2017   Recurrent falls 12/14/2017   Degenerative disc disease, cervical 12/14/2017   Chronic constipation 12/14/2017   Floaters in visual field, bilateral 08/10/2017   Head injury  consultation 08/10/2017   Arthritis of carpometacarpal [facility]) joint of right thumb 05/14/2017   Therapeutic opioid-induced constipation (OIC) 02/05/2017   BMI 29.0-29.9,adult 02/05/2017   Dry mouth 02/05/2017   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/08/2017   Intolerance of continuous positive airway pressure (CPAP) ventilation 01/08/2017   Benign neoplasm of colon 11/11/2016   Other long term (current) drug therapy 11/11/2016   History of total knee arthroplasty, 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   Cough 03/06/2016   At risk for injury related to fall 02/14/2016   Primary osteoarthritis of both hands 02/04/2016   Osteopenia of multiple sites 02/04/2016   Metatarsalgia of both feet 02/04/2016   Migraines 02/04/2016   Other fatigue 02/01/2016   Melena 08/20/2015   Acute recurrent maxillary  sinusitis 08/20/2015   Slow transit constipation 08/20/2015   Vitamin D  deficiency 08/20/2015   Thyroid  activity decreased 08/20/2015   Cephalalgia 08/20/2015   Preop cardiovascular exam 07/20/2015   Subacute ethmoidal sinusitis 12/11/2014   Chronic infection of sinus 07/12/2014   Nasal septal ulcer 05/25/2014   DJD (degenerative joint disease), cervical 03/22/2014   Right hand pain 03/22/2014   Deflected nasal septum 01/30/2014   Hypertrophy of nasal turbinates 01/30/2014   UARS (upper airway resistance syndrome) 10/04/2013   Insomnia secondary to depression with anxiety 10/04/2013   Exertional dyspnea 08/20/2013   History of rheumatic fever 08/20/2013   Hypomania (mild) single episode or unspecified 08/09/2013   OSA on CPAP 04/06/2013   Postoperative anemia due to acute blood loss 11/09/2012   OA (osteoarthritis) of knee 11/08/2012   History of giardia infection 07/15/2012   Atrophic vaginitis 07/15/2012   Vitreous degeneration of right eye    Nuclear sclerosis    Right shoulder pain 06/18/2011   Foot pain, left 06/18/2011   Hyperlipidemia 03/24/2011   Disorder of bone 02/12/2009   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   Migraine, unspecified, not intractable, without status migrainosus 02/12/2009    PCP: Garnette Ore MD  REFERRING PROVIDER: Helene Haddock MD  REFERRING DIAG:  [phone] (ICD-10-CM) - Acute pain of right knee  S32.010A (ICD-10-CM) - Closed compression fracture of body of L1 vertebra (HCC)    Rationale for Evaluation and Treatment: Rehabilitation  THERAPY DIAG:  Other low back pain  Muscle weakness (generalized)  Chronic pain of right knee  Unsteadiness on feet  ONSET DATE: March 2025  SUBJECTIVE:                                                                                                                                                                                           SUBJECTIVE STATEMENT: Pt reports she has not  been in pool since  last visit.  She reports she was exhausted after walking in pool and completing session in water .     Eval:  Fell in June. Have had  2 compression fx.    2 falls in one day early march 1st compression fx then again in June sustaining a second.  Has a membership here at [facility]. I have been Walking in pool 4 x week for 45 mins to 1 hr. But I still hurt. I want to work on my balance and stand up from a chair without using my arms.  Standing up from a chair my right knee and back hurts. Using rollator when walking back to pool but not any other time  PERTINENT HISTORY:  Evaluate and treat for L1 compression fx and right knee pain. Include aquatic therapy in treatment plan.  Chronic fatigue; fibromyalgia  PAIN:  Are you having pain? no: NPRS scale: 6/10 Pain location: neck, knees, back  Pain description: just pain Aggravating factors: transfering STS; in and out of bed; in and out of car Relieving factors: rest  PRECAUTIONS: Fall  RED FLAGS: None   WEIGHT BEARING RESTRICTIONS: No  FALLS:  Has patient fallen in last 6 months? Yes. Number of falls 1 fall backwards after putting my foot on a chair to toe my shoe.  LIVING ENVIRONMENT: Lives with: lives with their spouse Lives in: House/apartment Has following equipment at home: Single point cane, Environmental Consultant - 2 wheeled, Environmental Consultant - 4 wheeled, and Grab bars  OCCUPATION: retired financial controller  PLOF: Independent  PATIENT GOALS: Improve balance, get in and out of chairs, car and bed better  NEXT MD VISIT: as needed  OBJECTIVE:  Note: Objective measures were completed at Evaluation unless otherwise noted.  DIAGNOSTIC FINDINGS:  7/25 CT Lumbar IMPRESSION: 1. Subacute compression deformities at T12 and L1 with loss of height of 10% at each level. Sclerotic change consistent with ongoing healing process. I cannot state by CT if there is complete healing. No retropulsed bone. 2. L3-4: Mild disc bulge and facet osteoarthritis. No  compressive stenosis. 3. L4-5: Mild disc bulge. Bilateral facet degeneration and hypertrophy. 1-2 mm of degenerative anterolisthesis. Mild narrowing of both lateral recesses, not grossly compressive. 4. L5-S1: Endplate osteophytes and bulging of the disc. Bilateral facet osteoarthritis worse on the right. No compressive stenosis.  05/29/23 Xray R knee IMPRESSION: 1. 9.3 x 3.9 x 3.7 cm heterogenous layering hypodense fluid collection most compatible with a hematoma extending along the superficial fascia of the right posterolateral thigh, overlying and compressing the biceps femoris musculature, which could represent a soft tissue degloving injury, such as a Morel-Lavallee lesion. This collection appears to continue below the level of the knee into the lateral gastrocnemius muscle, concerning for intramuscular hematoma. 2. Intact right total knee arthroplasty. No acute osseous abnormality. 3. Small knee joint effusion.  PATIENT SURVEYS:  ODI  COGNITION: Overall cognitive status: Within functional limits for tasks assessed     SENSATION: WFL  MUSCLE LENGTH: Hamstrings: wfl   POSTURE: rounded shoulders, forward head, decreased thoracic kyphosis, and left pelvic obliquity  PALPATION: Right patella crepitus  LUMBAR ROM:   AROM eval  Flexion full  Extension   Right lateral flexion 90% limited P!  Left lateral flexion 90% limited P!  Right rotation   Left rotation    (Blank rows = not tested)  LOWER EXTREMITY ROM:     Active  Right eval Left eval  Hip flexion    Hip extension  Hip abduction    Hip adduction    Hip internal rotation    Hip external rotation    Knee flexion 110 128  Knee extension 0 0  Ankle dorsiflexion    Ankle plantarflexion    Ankle inversion    Ankle eversion     (Blank rows = not tested)  LOWER EXTREMITY MMT:    MMT Right eval Left eval  Hip flexion 3 3  Hip extension    Hip abduction 4+ 4+  Hip adduction 4- 4-  Hip internal  rotation    Hip external rotation    Knee flexion 4 5  Knee extension 3+ 4  Ankle dorsiflexion    Ankle plantarflexion    Ankle inversion    Ankle eversion     (Blank rows = not tested)  LUMBAR SPECIAL TESTS:  Slump test: Negative  FUNCTIONAL TESTS:  30 seconds chair stand test Timed up and go (TUG): 21.24   Item Test date: 11/05/23 Date:  Date:   Sitting to standing 2. able to stand using hands after several tries Insert SmartPhrase OPRCBERGREEVAL Insert SmartPhrase OPRCBERGREEVAL  2. Standing unsupported 4. able to stand safely for 2 minutes    3. Sitting with back /unsupported, feet supported 4. able to sit safely and securely for 2 minutes    4. Standing to sitting 2. uses back of legs against chair to control descent    5. Pivot transfer  3. able to transfer safely with definite need of hands    6. Standing unsupported with eyes closed 2. able to stand 3 seconds    7. Standing unsupported with feet together 1. needs help to attain position but able to stand 15 seconds feet together    8. Reaching forward with outstretched arms while standing 2. can reach forward 5 cm (2 inches)    9. Pick up object from the floor from standing 3. able to pick up slipper but needs supervision    10. Turning to look behind over left and right shoulders while standing 3. looks behind one side only, other side shows less weight shift    11. Turn 360 degrees 1. needs close supervision or verbal cuing    12. Place alternate foot on step or stool while standing unsupported 0. needs assistance to keep from falling/unable to try    13. Standing unsupported one foot in front 0. loses balance while stepping or standing    14. Standing on one leg 0. unable to try of needs assist to prevent fall      Total Score 27/56 Total Score:    Total Score:     GAIT: Distance walked: 400 ft Assistive device utilized: rollator Level of assistance: Complete Independence Comments: quickened short step length, reduced  hip/knee flex, little arm swing  TREATMENT  [facility] Adult PT Treatment:                                              Date: 12/22/23 Pt seen for aquatic therapy today.  Treatment took place in water  3.5-4.75 ft in depth at the [facility] pool. Temp of water  was 91.  Pt entered/exited the pool via stairs with bil rail.  - warm up of 3 laps forward/ backward and side stepping 2 laps - side stepping with rainbow hand floats with abdct/add x 3, cues to slow pace and  -  tandem gait forward/ backward without UE support (good balance challenge) - suitcase carry with bil/single  rainbow hand float, marching forward / backward - wall push up/off for reactive balance x 10 - runners step ups x 8 each (heavy cues for sequence), UE support - lateral step ups x 8 each  -  STS from bench in water  with feet on blue step 4 sec descending to seat x 5   12/18/23 Nustep L5x8 minutes all four extremities for w/u   Tandem stance solid surface 3x30 seconds B  Narrow BOS 4x30 seconds  Tandem walking x3 laps next to table, MinA from PT for balance Lateral weight shifts x60 seconds Narrow BOS blue foam pad 3x30 seconds SLS with one foot on blue yoga block 3x30 seconds B   Date: 12/15/23 Pt seen for aquatic therapy today.  Treatment took place in water  3.5-4.75 ft in depth at the [facility] pool. Temp of water  was 91.  Pt entered/exited the pool via stairs with bil rail. * pt completed walking 3 directions in lap pool for ~40 min prior to session   - tandem gait forward/ backward ->with rainbow hand floats (good balance challenge) - suitcase carry with bil rainbow hand float, marching forward / backward - UE on rainbow hand floats:  3 way toe touch, 2 x 5 each; forward walking kicks x 1 lap; hip abdct/ add x 10  - TrA set with hollow long noodle in standard stance x 8, staggered stance x 5 (difficulty maintaining posture/positioning)  - wall push up/off for reactive balance x 10 -  alternating toe taps to 1st step x 10, without UE support (improved) - STS from 3rd step x 2 (required UE support to ascend) -> STS from bench in water  with feet on blue step (improved!) x 6  10/15  General concussion symptoms management, exercising under symptoms threshold, precautions/safety with current HEP  [facility] Adult PT Treatment:                                             Date: 11/26/23 Pt seen for aquatic therapy today.  Treatment took place in water  3.5-4.75 ft in depth at the [facility] pool. Temp of water  was 91.  Pt entered/exited the pool via stairs with bil rail.  - unsupported (4 ft) walking forward/ backward, cues for vertical trunk, increased Rt step height, equal step length - unsupported side stepping with arm add/abdct -> with rainbow hand floats - cues for neutral Rt foot - suitcase carry with bil / single rainbow hand float, walking forward / backward - tandem stance with horz abdct/add with rainbow hand floats (good challenge) - tandem gait forward/ backward with rainbow hand floats (good balance challenge) - wall push up/off for reactive balance  - alternating toe taps to 1st step x 10, without UE support (good balance challenge in Rt SLS)  Pt requires the buoyancy and hydrostatic pressure of water  for support, and to offload joints by unweighting joint load by at least 50 % in navel deep water  and by at least 75-80% in chest to neck deep water .  Viscosity of the water  is needed for resistance of strengthening. Water  current perturbations provides challenge to standing balance requiring increased core activation.    11/17/23  Partial range bridges 2x10 PPT x15 Single leg clamshells red TB + TA set x10 B SAQs 4# x12  B  Wide tandem stance 3x30 seconds Narrow BOS solid surface 3x30 seconds  Nustep L5x6 minutes seat 9 BLEs only   9/15  Supine PPT 2x10 2s PPT with bridge 2x10 LTR 20x Supine piriformis 30s 3x each STS from elevated table 3x8  Daily  walking program of at least 10 min per day   Eval Self care:Posture and body mechanic instruction; grab bars for commode; shower. STS transfers from chairs in restaurants; use of AD; safety precautions to avoid falls                                                                                                                             PATIENT EDUCATION:  Education details: exercise rationale, modification and progression   Person educated: Patient Education method: Explanation, demonstration Education comprehension: verbalized understanding  HOME EXERCISE PROGRAM: Access Code: [email or web address] URL: [email or web address] Date: 11/09/2023 Prepared by: Dale Call  ASSESSMENT:  CLINICAL IMPRESSION: Pt demonstrating gradual improvement in balance in water  with exercise. Frequent cues to slow speed of exercise.  No increase in pain during session.  Pt has met STG 1 and 2 and is progressing well towards remaining goals.     Eval: Patient is an 83 y.o. f who was seen today for physical therapy evaluation and treatment for back and right knee pain. PmHx includes bilater TKR >8-10 yrs ago and recent falls onto right knee sustaining T12 and L1 compression fx (earlier this year).  She is a member here at [facility] and comes 4 days a week and walks submerged x 45 -60 mins.   She presents with pain limited deficits in mid to lb and right knee  effecting strength, activity tolerance, gait, balance, and functional mobility with ADL's. Specifically she reports most difficulty with all transfers STS; sup to sit and getting out of car. Pt and cg given information on various grab bars for bed, commode and shower encouraging increased safety at home. As indicated by subjective information, and objective and outcome measure score, deficits identified are affecting and limiting overall functional mobility.    OBJECTIVE IMPAIRMENTS: Abnormal gait, decreased activity tolerance, decreased balance,  decreased knowledge of use of DME, decreased mobility, difficulty walking, decreased ROM, decreased strength, postural dysfunction, obesity, and pain.   ACTIVITY LIMITATIONS: carrying, lifting, bending, sitting, standing, squatting, stairs, and transfers  PARTICIPATION LIMITATIONS: meal prep, cleaning, laundry, driving, shopping, and community activity  PERSONAL FACTORS: Age and 1-2 comorbidities: see PmHx are also affecting patient's functional outcome.   REHAB POTENTIAL: Good  CLINICAL DECISION MAKING: Evolving/moderate complexity  EVALUATION COMPLEXITY: Moderate   GOALS: Goals reviewed with patient? Yes  SHORT TERM GOALS: Target date: 12/14/23 (delay in beginning therapy)  Pt will tolerate full aquatic sessions consistently without increase in pain and with improving function to demonstrate good toleration and effectiveness of intervention.  Baseline: Goal status: MET- 12/22/23  2.  Pt will tolerate stair climbing in and out of pool using approp pattern ascending and descending 6  steps with use of handrail  Baseline:  Goal status: MET - 12/22/23  3.  Pt will perform 10 STS from water  bench onto water  step unsupported without LOB Baseline:  Goal status: In progress - 12/22/23  4.  Pt and cg will consider grab bars for commode  Baseline:  Goal status: INITIAL  5.  Pt will be indep and compliant with initial land based HEP for improving strength and safety at home Baseline:  Goal status: INITIAL    LONG TERM GOALS: Target date: 01/15/24  Pt to improve on ODI by 13% to demonstrate statistically significant Improvement in function. (MCID 13-15%) Baseline: 23/50=46% Goal status: INITIAL  2.  Pt will improve strength in bil hip flex and right knee ext by 1 grade to demonstrate improved overall physical function Baseline: see chart Goal status: INITIAL  3.  Pt will improve on Berg balance test to >/= 35/56 to demonstrate a decrease in fall risk. (MCD 7) Baseline:27/56  (medium fall risk)  Goal status: INITIAL  4.  Pt will improve on 30s STS test  from standard chair from 3 to 6 to demonstrate improving functional lower extremity strength, transitional movements, and balance. (MDC = 4.2sec)  Baseline: 3 Goal status: INITIAL  5.  Pt will report decrease in pain by at least 50% for improved toleration to activity/quality of life and to demonstrate improved management of pain. Baseline: see chart Goal status: INITIAL  6.  Pt will be indep with final HEP's (land and aquatic as appropriate) for continued management of condition Baseline: see chart Goal status: INITIAL  PLAN:  PT FREQUENCY: 2x/week  PT DURATION: 10 w(extended out due to scheduling conflicts) Initial 8 sessions aquatic with 1 Land based for instruction on land based initial HEP. Then last 7-8 alternate  PLANNED INTERVENTIONS: [id number]- PT Re-evaluation, 97750- Physical Performance Testing, 97110-Therapeutic exercises, 97530- Therapeutic activity, 97112- Neuromuscular re-education, 97535- Self Care, 02859- Manual therapy, [phone]- Gait training, [phone]- Aquatic Therapy, [phone]- Ionotophoresis 4mg /ml Dexamethasone , 79439 (1-2 muscles), 20561 (3+ muscles)- Dry Needling, Patient/Family education, Balance training, Stair training, Taping, Joint mobilization, DME instructions, Cryotherapy, and Moist heat.  PLAN FOR NEXT SESSION: aquatic: core and le strengthening, transfers, stair climbing, balance retraining  Land:  hip and knee strength, balance work, teaching laboratory technician, gait training  .Delon Aquas, PTA 12/22/23 3:51 PM [facility] [address] [place], [place], [address] Phone: [phone]   Fax:  [phone]

## 2023-12-23 ENCOUNTER — Other Ambulatory Visit: Payer: Self-pay | Admitting: Sports Medicine

## 2023-12-23 DIAGNOSIS — M542 Cervicalgia: Secondary | ICD-10-CM

## 2023-12-23 DIAGNOSIS — M5030 Other cervical disc degeneration, unspecified cervical region: Secondary | ICD-10-CM

## 2023-12-24 ENCOUNTER — Encounter (HOSPITAL_BASED_OUTPATIENT_CLINIC_OR_DEPARTMENT_OTHER): Payer: Self-pay | Admitting: Physical Therapy

## 2023-12-24 ENCOUNTER — Ambulatory Visit (HOSPITAL_BASED_OUTPATIENT_CLINIC_OR_DEPARTMENT_OTHER): Admitting: Physical Therapy

## 2023-12-24 DIAGNOSIS — M6281 Muscle weakness (generalized): Secondary | ICD-10-CM

## 2023-12-24 DIAGNOSIS — G8929 Other chronic pain: Secondary | ICD-10-CM

## 2023-12-24 DIAGNOSIS — R2681 Unsteadiness on feet: Secondary | ICD-10-CM

## 2023-12-24 DIAGNOSIS — M5459 Other low back pain: Secondary | ICD-10-CM

## 2023-12-24 NOTE — Therapy (Signed)
 OUTPATIENT PHYSICAL THERAPY THORACOLUMBAR TREATMENT   Patient Name: [name] MRN: [id number] DOB:1940/05/19, 83 y.o., female Today's Date: 12/24/2023  END OF SESSION:  PT End of Session - 12/24/23 1406     Visit Number 9    Number of Visits 16    Date for Recertification  01/15/24    Authorization Type aetna Mcr    Progress Note Due on Visit 10    PT Start Time 0130    PT Stop Time 0208    PT Time Calculation (min) 38 min    Equipment Utilized During Treatment Gait belt    Activity Tolerance Patient tolerated treatment well    Behavior During Therapy WFL for tasks assessed/performed               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   Hyperlipidemia    Impaired hearing    left ear, wears hearing aids   Insomnia    circadian rhythm component   Insomnia    Migraine 11/1986   Spillman   Nuclear sclerosis    OSA on CPAP 03/2005   uses cpap setting of 10   Osteoarthritis 2007   Deveschwar   Pain last week   left under breast pain    Plantar fasciitis    Rheumatic fever    Scarlet fever as child   Sleep apnea    Swallowing difficulty    Thyroid  disorder    Vitamin D  deficiency    Vitreous degeneration of right eye    Past Surgical History:  Procedure Laterality Date   BREAST BIOPSY Left 6/00   [place]   BREAST EXCISIONAL BIOPSY Left 1998   DE QUERVAIN'S RELEASE Right 10/97   Sypher   left breast biopsy     ROOT CANAL  02/11/2012   TONSILLECTOMY  age 13   TONSILLECTOMY AND ADENOIDECTOMY  1952   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   TOTAL KNEE ARTHROPLASTY Right 11/08/2012   Procedure: RIGHT TOTAL KNEE ARTHROPLASTY;  Surgeon: Dempsey LULLA Moan, MD;  Location: [facility];  Service: Orthopedics;  Laterality:  Right;   TUBAL LIGATION     Patient Active Problem List   Diagnosis Date Noted   Venous stasis of lower extremity 07/28/2023   Great toe pain, right 06/16/2023   Acute pain of right knee 05/28/2023   Closed compression fracture of body of first lumbar vertebra (HCC) 05/28/2023   Bilateral lower extremity edema 02/03/2022   Fluid retention 12/17/2021   Low back pain 10/03/2021   Atherosclerotic heart disease of native coronary artery without angina pectoris 05/27/2021   Diastolic dysfunction 05/27/2021   Neck pain 05/27/2021   Migraine 04/23/2021   Generalized obesity 04/23/2021   Depression 03/18/2021   Lumbar spine pain 08/30/2020   Flatulence, eructation and gas pain 05/07/2020   Change in bowel habit 05/07/2020   Gout 06/22/2019   Post viral syndrome 06/15/2019   Cervicalgia 06/15/2019   Chronic tension-type headache, intractable 06/15/2019   Fall 04/21/2018   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   Diverticulosis 12/23/2017   History of adenomatous polyp of colon 12/23/2017   Recurrent falls 12/14/2017   Degenerative disc disease, cervical 12/14/2017  Chronic constipation 12/14/2017   Floaters in visual field, bilateral 08/10/2017   Head injury consultation 08/10/2017   Arthritis of carpometacarpal [facility]) joint of right thumb 05/14/2017   Therapeutic opioid-induced constipation (OIC) 02/05/2017   BMI 29.0-29.9,adult 02/05/2017   Dry mouth 02/05/2017   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/08/2017   Intolerance of continuous positive airway pressure (CPAP) ventilation 01/08/2017   Benign neoplasm of colon 11/11/2016   Other long term (current) drug therapy 11/11/2016   History of total knee arthroplasty, 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   Cough 03/06/2016   At risk for injury related to fall 02/14/2016   Primary osteoarthritis of both hands 02/04/2016   Osteopenia of multiple sites 02/04/2016   Metatarsalgia of both feet 02/04/2016   Migraines 02/04/2016   Other  fatigue 02/01/2016   Melena 08/20/2015   Acute recurrent maxillary sinusitis 08/20/2015   Slow transit constipation 08/20/2015   Vitamin D  deficiency 08/20/2015   Thyroid  activity decreased 08/20/2015   Cephalalgia 08/20/2015   Preop cardiovascular exam 07/20/2015   Subacute ethmoidal sinusitis 12/11/2014   Chronic infection of sinus 07/12/2014   Nasal septal ulcer 05/25/2014   DJD (degenerative joint disease), cervical 03/22/2014   Right hand pain 03/22/2014   Deflected nasal septum 01/30/2014   Hypertrophy of nasal turbinates 01/30/2014   UARS (upper airway resistance syndrome) 10/04/2013   Insomnia secondary to depression with anxiety 10/04/2013   Exertional dyspnea 08/20/2013   History of rheumatic fever 08/20/2013   Hypomania (mild) single episode or unspecified 08/09/2013   OSA on CPAP 04/06/2013   Postoperative anemia due to acute blood loss 11/09/2012   OA (osteoarthritis) of knee 11/08/2012   History of giardia infection 07/15/2012   Atrophic vaginitis 07/15/2012   Vitreous degeneration of right eye    Nuclear sclerosis    Right shoulder pain 06/18/2011   Foot pain, left 06/18/2011   Hyperlipidemia 03/24/2011   Disorder of bone 02/12/2009   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 She would like to continue to work on balance to avoid future falls.     EvalBETHA Rasmussen in June. Have had  2 compression fx.    2 falls in one day early march 1st compression fx then again in June sustaining a second.  Has a membership here at [facility]. I have been Walking in pool 4 x week for 45 mins to 1 hr. But I still hurt. I want to work on my balance and stand up from a chair without using my arms.  Standing up from a chair my right knee and back hurts. Using rollator when walking back to pool but not any other time  PERTINENT HISTORY:  Evaluate and treat for L1 compression fx and right knee pain. Include aquatic therapy in treatment plan.  Chronic fatigue; fibromyalgia  PAIN:  Are you having pain? no: NPRS scale: 5-6/10 Pain location: neck, knees, back  Pain description: fibro vs OA I don't know what  Aggravating factors: transfering STS; in and out of bed; in and out of car Relieving factors: rest  PRECAUTIONS: Fall  RED FLAGS: None   WEIGHT BEARING RESTRICTIONS: No  FALLS:  Has patient fallen in last 6 months? Yes. Number of falls 1 fall backwards after putting my foot on a chair to toe my shoe.  LIVING ENVIRONMENT: Lives with: lives with their spouse Lives in: House/apartment Has following equipment at home: Single point cane, Environmental Consultant - 2 wheeled, Environmental Consultant - 4 wheeled, and Grab bars  OCCUPATION: retired financial controller  PLOF: Independent  PATIENT GOALS: Improve balance, get in and out of chairs, car and bed better  NEXT MD VISIT: as needed  OBJECTIVE:  Note: Objective measures were completed at Evaluation unless otherwise noted.  DIAGNOSTIC FINDINGS:  7/25 CT Lumbar IMPRESSION: 1. Subacute compression deformities at T12 and L1 with loss of height of 10% at each level. Sclerotic change consistent with ongoing healing process. I cannot state by CT if there is complete healing. No retropulsed bone. 2. L3-4: Mild  disc bulge and facet osteoarthritis. No compressive stenosis. 3. L4-5: Mild disc bulge. Bilateral facet degeneration and hypertrophy. 1-2 mm of degenerative anterolisthesis. Mild narrowing of both lateral recesses, not grossly compressive. 4. L5-S1: Endplate osteophytes and bulging of the disc. Bilateral facet osteoarthritis worse on the right. No compressive stenosis.  05/29/23 Xray R knee IMPRESSION: 1. 9.3 x 3.9 x 3.7 cm heterogenous layering hypodense fluid collection most compatible with a hematoma extending along the superficial fascia of the right posterolateral thigh, overlying and compressing the biceps femoris musculature, which could represent a soft tissue degloving injury, such as a Morel-Lavallee lesion. This collection appears to continue below the level of the knee into the lateral gastrocnemius muscle, concerning for intramuscular hematoma. 2. Intact right total knee arthroplasty. No acute osseous abnormality. 3. Small knee joint effusion.  PATIENT SURVEYS:  ODI  COGNITION: Overall cognitive status: Within functional limits for tasks assessed     SENSATION: WFL  MUSCLE LENGTH: Hamstrings: wfl   POSTURE: rounded shoulders, forward head, decreased thoracic kyphosis, and left pelvic obliquity  PALPATION: Right patella crepitus  LUMBAR ROM:   AROM eval  Flexion full  Extension   Right lateral flexion 90% limited P!  Left lateral flexion 90% limited P!  Right rotation   Left rotation    (Blank rows = not tested)  LOWER EXTREMITY ROM:     Active  Right eval Left eval  Hip flexion  Hip extension    Hip abduction    Hip adduction    Hip internal rotation    Hip external rotation    Knee flexion 110 128  Knee extension 0 0  Ankle dorsiflexion    Ankle plantarflexion    Ankle inversion    Ankle eversion     (Blank rows = not tested)  LOWER EXTREMITY MMT:    MMT Right eval Left eval  Hip flexion 3 3  Hip extension    Hip abduction 4+  4+  Hip adduction 4- 4-  Hip internal rotation    Hip external rotation    Knee flexion 4 5  Knee extension 3+ 4  Ankle dorsiflexion    Ankle plantarflexion    Ankle inversion    Ankle eversion     (Blank rows = not tested)  LUMBAR SPECIAL TESTS:  Slump test: Negative  FUNCTIONAL TESTS:  30 seconds chair stand test Timed up and go (TUG): 21.24   Item Test date: 11/05/23 Date:  Date:   Sitting to standing 2. able to stand using hands after several tries Insert SmartPhrase OPRCBERGREEVAL Insert SmartPhrase OPRCBERGREEVAL  2. Standing unsupported 4. able to stand safely for 2 minutes    3. Sitting with back /unsupported, feet supported 4. able to sit safely and securely for 2 minutes    4. Standing to sitting 2. uses back of legs against chair to control descent    5. Pivot transfer  3. able to transfer safely with definite need of hands    6. Standing unsupported with eyes closed 2. able to stand 3 seconds    7. Standing unsupported with feet together 1. needs help to attain position but able to stand 15 seconds feet together    8. Reaching forward with outstretched arms while standing 2. can reach forward 5 cm (2 inches)    9. Pick up object from the floor from standing 3. able to pick up slipper but needs supervision    10. Turning to look behind over left and right shoulders while standing 3. looks behind one side only, other side shows less weight shift    11. Turn 360 degrees 1. needs close supervision or verbal cuing    12. Place alternate foot on step or stool while standing unsupported 0. needs assistance to keep from falling/unable to try    13. Standing unsupported one foot in front 0. loses balance while stepping or standing    14. Standing on one leg 0. unable to try of needs assist to prevent fall      Total Score 27/56 Total Score:    Total Score:     GAIT: Distance walked: 400 ft Assistive device utilized: rollator Level of assistance: Complete  Independence Comments: quickened short step length, reduced hip/knee flex, little arm swing  TREATMENT  [facility] Adult PT Treatment:                                              12/24/23: - Nustep L5x8 minutes all four extremities for w/u  - LAQ with 1lb weight  - Marching with 1b weight on floor for 1 min, on airex for 1 min - 1 lap around clinic with gait belt with cognitive challenge - Step up onto 4 step alternating - Stepping over hurdles in fwd, lateral direction at bar.   12/18/23  Nustep L5x8 minutes all four extremities for w/u   Tandem stance solid surface 3x30 seconds B  Narrow BOS 4x30 seconds  Tandem walking x3 laps next to table, MinA from PT for balance Lateral weight shifts x60 seconds Narrow BOS blue foam pad 3x30 seconds SLS with one foot on blue yoga block 3x30 seconds B        Date: 12/15/23 Pt seen for aquatic therapy today.  Treatment took place in water  3.5-4.75 ft in depth at the [facility] pool. Temp of water  was 91.  Pt entered/exited the pool via stairs with bil rail. * pt completed walking 3 directions in lap pool for ~40 min prior to session   - tandem gait forward/ backward ->with rainbow hand floats (good balance challenge) - suitcase carry with bil rainbow hand float, marching forward / backward - UE on rainbow hand floats:  3 way toe touch, 2 x 5 each; forward walking kicks x 1 lap; hip abdct/ add x 10  - TrA set with hollow long noodle in standard stance x 8, staggered stance x 5 (difficulty maintaining posture/positioning)  - wall push up/off for reactive balance x 10 - alternating toe taps to 1st step x 10, without UE support (improved) - STS from 3rd step x 2 (required UE support to ascend) -> STS from bench in water  with feet on blue step (improved!) x 6  10/15  General concussion symptoms management, exercising under symptoms threshold, precautions/safety with current HEP  [facility] Adult PT Treatment:                                              Date: 11/26/23 Pt seen for aquatic therapy today.  Treatment took place in water  3.5-4.75 ft in depth at the [facility] pool. Temp of water  was 91.  Pt entered/exited the pool via stairs with bil rail.  - unsupported (4 ft) walking forward/ backward, cues for vertical trunk, increased Rt step height, equal step length - unsupported side stepping with arm add/abdct -> with rainbow hand floats - cues for neutral Rt foot - suitcase carry with bil / single rainbow hand float, walking forward / backward - tandem stance with horz abdct/add with rainbow hand floats (good challenge) - tandem gait forward/ backward with rainbow hand floats (good balance challenge) - wall push up/off for reactive balance  - alternating toe taps to 1st step x 10, without UE support (good balance challenge in Rt SLS)  Pt requires the buoyancy and hydrostatic pressure of water  for support, and to offload joints by unweighting joint load by at least 50 % in navel deep water  and by at least 75-80% in chest to neck deep water .  Viscosity of the water  is needed for resistance of strengthening. Water  current perturbations provides challenge to standing balance requiring increased core activation.    11/17/23  Partial range bridges 2x10 PPT x15 Single leg clamshells red TB + TA set x10 B SAQs 4# x12 B  Wide tandem stance 3x30 seconds Narrow BOS solid surface 3x30 seconds  Nustep L5x6 minutes seat 9 BLEs only   9/15  Supine PPT 2x10 2s PPT with bridge 2x10 LTR 20x Supine piriformis 30s 3x each STS from elevated table 3x8  Daily walking program of at least 10 min per day   Eval Self care:Posture and body mechanic instruction; grab bars for commode; shower.  STS transfers from chairs in restaurants; use of AD; safety precautions to avoid falls                                                                                                                             PATIENT EDUCATION:   Education details: intro to aquatic therapy   Person educated: Patient Education method: Explanation, demonstration Education comprehension: verbalized understanding  HOME EXERCISE PROGRAM: Access Code: [email or web address] URL: [email or web address] Date: 11/09/2023 Prepared by: Dale Call  ASSESSMENT:  CLINICAL IMPRESSION:  Session with focus on balance and LE strengthening with SBA and no LOB. She is highly motivated. Pt walks with a shuffled gait pattern, minimal arm swing. Pt reports getting dizzy when having to perform turns while walking. She reports difficulty with short term cognitive memory. She has made improvements with sit to stands without use of UE's.     Eval: Patient is an 83 y.o. f who was seen today for physical therapy evaluation and treatment for back and right knee pain. PmHx includes bilater TKR >8-10 yrs ago and recent falls onto right knee sustaining T12 and L1 compression fx (earlier this year).  She is a member here at [facility] and comes 4 days a week and walks submerged x 45 -60 mins.   She presents with pain limited deficits in mid to lb and right knee  effecting strength, activity tolerance, gait, balance, and functional mobility with ADL's. Specifically she reports most difficulty with all transfers STS; sup to sit and getting out of car. Pt and cg given information on various grab bars for bed, commode and shower encouraging increased safety at home. As indicated by subjective information, and objective and outcome measure score, deficits identified are affecting and limiting overall functional mobility.    OBJECTIVE IMPAIRMENTS: Abnormal gait, decreased activity tolerance, decreased balance, decreased knowledge of use of DME, decreased mobility, difficulty walking, decreased ROM, decreased strength, postural dysfunction, obesity, and pain.   ACTIVITY LIMITATIONS: carrying, lifting, bending, sitting, standing, squatting, stairs, and  transfers  PARTICIPATION LIMITATIONS: meal prep, cleaning, laundry, driving, shopping, and community activity  PERSONAL FACTORS: Age and 1-2 comorbidities: see PmHx are also affecting patient's functional outcome.   REHAB POTENTIAL: Good  CLINICAL DECISION MAKING: Evolving/moderate complexity  EVALUATION COMPLEXITY: Moderate   GOALS: Goals reviewed with patient? Yes  SHORT TERM GOALS: Target date: 12/14/23 (delay in beginning therapy)  Pt will tolerate full aquatic sessions consistently without increase in pain and with improving function to demonstrate good toleration and effectiveness of intervention.  Baseline: Goal status: INITIAL  2.  Pt will tolerate stair climbing in and out of pool using approp pattern ascending and descending 6 steps with use of handrail  Baseline:  Goal status: INITIAL  3.  Pt will perform 10 STS from water  bench onto water  step unsupported without LOB Baseline:  Goal status: INITIAL  4.  Pt and cg will consider grab bars for commode  Baseline:  Goal status: INITIAL  5.  Pt will be indep and compliant with initial land based HEP for improving strength and safety at home Baseline:  Goal status: INITIAL    LONG TERM GOALS: Target date: 01/15/24  Pt to improve on ODI by 13% to demonstrate statistically significant Improvement in function. (MCID 13-15%) Baseline: 23/50=46% Goal status: INITIAL  2.  Pt will improve strength in bil hip flex and right knee ext by 1 grade to demonstrate improved overall physical function Baseline: see chart Goal status: INITIAL  3.  Pt will improve on Berg balance test to >/= 35/56 to demonstrate a decrease in fall risk. (MCD 7) Baseline:27/56 (medium fall risk)  Goal status: INITIAL  4.  Pt will improve on 30s STS test  from standard chair from 3 to 6 to demonstrate improving functional lower extremity strength, transitional movements, and balance. (MDC = 4.2sec)  Baseline: 3 Goal status: INITIAL  5.  Pt  will report decrease in pain by at least 50% for improved toleration to activity/quality of life and to demonstrate improved management of pain. Baseline: see chart Goal status: INITIAL  6.  Pt will be indep with final HEP's (land and aquatic as appropriate) for continued management of condition Baseline: see chart Goal status: INITIAL  PLAN:  PT FREQUENCY: 2x/week  PT DURATION: 10 w(extended out due to scheduling conflicts) Initial 8 sessions aquatic with 1 Land based for instruction on land based initial HEP. Then last 7-8 alternate  PLANNED INTERVENTIONS: [id number]- PT Re-evaluation, 97750- Physical Performance Testing, 97110-Therapeutic exercises, 97530- Therapeutic activity, [id number]- Neuromuscular re-education, 97535- Self Care, [id number]- Manual therapy, [phone]- Gait training, [phone]- Aquatic Therapy, [phone]- Ionotophoresis 4mg /ml Dexamethasone , 79439 (1-2 muscles), 20561 (3+ muscles)- Dry Needling, Patient/Family education, Balance training, Stair training, Taping, Joint mobilization, DME instructions, Cryotherapy, and Moist heat.  PLAN FOR NEXT SESSION: aquatic: core and le strengthening, transfers, stair climbing, balance retraining  Land:  hip and knee strength, balance work, teaching laboratory technician, gait training Rojean Batten PT, DPT 12/24/23  2:20 PM

## 2023-12-25 ENCOUNTER — Other Ambulatory Visit: Payer: Self-pay

## 2023-12-29 ENCOUNTER — Encounter (HOSPITAL_BASED_OUTPATIENT_CLINIC_OR_DEPARTMENT_OTHER): Payer: Self-pay | Admitting: Physical Therapy

## 2023-12-29 ENCOUNTER — Ambulatory Visit (HOSPITAL_BASED_OUTPATIENT_CLINIC_OR_DEPARTMENT_OTHER): Attending: Sports Medicine | Admitting: Physical Therapy

## 2023-12-29 DIAGNOSIS — M25561 Pain in right knee: Secondary | ICD-10-CM | POA: Insufficient documentation

## 2023-12-29 DIAGNOSIS — G8929 Other chronic pain: Secondary | ICD-10-CM | POA: Diagnosis present

## 2023-12-29 DIAGNOSIS — M5459 Other low back pain: Secondary | ICD-10-CM | POA: Insufficient documentation

## 2023-12-29 DIAGNOSIS — R2681 Unsteadiness on feet: Secondary | ICD-10-CM | POA: Insufficient documentation

## 2023-12-29 DIAGNOSIS — M6281 Muscle weakness (generalized): Secondary | ICD-10-CM | POA: Insufficient documentation

## 2023-12-29 NOTE — Therapy (Signed)
 OUTPATIENT PHYSICAL THERAPY THORACOLUMBAR TREATMENT Progress Note Reporting Period 11/05/23 to 12/29/23  See note below for Objective Data and Assessment of Progress/Goals.      Patient Name: [name] MRN: [id number] DOB:[birth year], 83 y.o., female Today's Date: 12/29/2023  END OF SESSION:  PT End of Session - 12/29/23 1734     Visit Number 10    Number of Visits 16    Date for Recertification  01/15/24    Authorization Type aetna Mcr    Progress Note Due on Visit 20    PT Start Time 1447    PT Stop Time 1530    PT Time Calculation (min) 43 min    Equipment Utilized During Treatment Gait belt    Activity Tolerance Patient tolerated treatment well    Behavior During Therapy WFL for tasks assessed/performed                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   Hyperlipidemia    Impaired hearing    left ear, wears hearing aids   Insomnia    circadian rhythm component   Insomnia    Migraine 11/1986   Spillman   Nuclear sclerosis    OSA on CPAP 03/2005   uses cpap setting of 10   Osteoarthritis 2007   Deveschwar   Pain last week   left under breast pain    Plantar fasciitis    Rheumatic fever    Scarlet fever as child   Sleep apnea    Swallowing difficulty    Thyroid  disorder    Vitamin D  deficiency    Vitreous degeneration of right eye    Past Surgical History:  Procedure Laterality Date   BREAST BIOPSY Left 6/00   [place]   BREAST EXCISIONAL BIOPSY Left 1998   DE QUERVAIN'S RELEASE Right 10/97   Sypher   left breast biopsy     ROOT CANAL  02/11/2012   TONSILLECTOMY  age 66   TONSILLECTOMY AND ADENOIDECTOMY  1952   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   TOTAL KNEE ARTHROPLASTY Right 11/08/2012    Procedure: RIGHT TOTAL KNEE ARTHROPLASTY;  Surgeon: Brenda LULLA Moan, MD;  Location: [facility];  Service: Orthopedics;  Laterality: Right;   TUBAL LIGATION     Patient Active Problem List   Diagnosis Date Noted   Venous stasis of lower extremity 07/28/2023   Great toe pain, right 06/16/2023   Acute pain of right knee 05/28/2023   Closed compression fracture of body of first lumbar vertebra (HCC) 05/28/2023   Bilateral lower extremity edema 02/03/2022   Fluid retention 12/17/2021   Low back pain 10/03/2021   Atherosclerotic heart disease of native coronary artery without angina pectoris 05/27/2021   Diastolic dysfunction 05/27/2021   Neck pain 05/27/2021   Migraine 04/23/2021   Generalized obesity 04/23/2021   Depression 03/18/2021   Lumbar spine pain 08/30/2020   Flatulence, eructation and gas pain 05/07/2020   Change in bowel habit 05/07/2020   Gout 06/22/2019   Post viral syndrome 06/15/2019   Cervicalgia 06/15/2019   Chronic tension-type headache, intractable 06/15/2019   Fall 04/21/2018   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   Diverticulosis 12/23/2017  History of adenomatous polyp of colon 12/23/2017   Recurrent falls 12/14/2017   Degenerative disc disease, cervical 12/14/2017   Chronic constipation 12/14/2017   Floaters in visual field, bilateral 08/10/2017   Head injury consultation 08/10/2017   Arthritis of carpometacarpal [facility]) joint of right thumb 05/14/2017   Therapeutic opioid-induced constipation (OIC) 02/05/2017   BMI 29.0-29.9,adult 02/05/2017   Dry mouth 02/05/2017   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/08/2017   Intolerance of continuous positive airway pressure (CPAP) ventilation 01/08/2017   Benign neoplasm of colon 11/11/2016   Other long term (current) drug therapy 11/11/2016   History of total knee arthroplasty, 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   Cough 03/06/2016   At risk for injury related to fall 02/14/2016   Primary osteoarthritis of both hands  02/04/2016   Osteopenia of multiple sites 02/04/2016   Metatarsalgia of both feet 02/04/2016   Migraines 02/04/2016   Other fatigue 02/01/2016   Melena 08/20/2015   Acute recurrent maxillary sinusitis 08/20/2015   Slow transit constipation 08/20/2015   Vitamin D  deficiency 08/20/2015   Thyroid  activity decreased 08/20/2015   Cephalalgia 08/20/2015   Preop cardiovascular exam 07/20/2015   Subacute ethmoidal sinusitis 12/11/2014   Chronic infection of sinus 07/12/2014   Nasal septal ulcer 05/25/2014   DJD (degenerative joint disease), cervical 03/22/2014   Right hand pain 03/22/2014   Deflected nasal septum 01/30/2014   Hypertrophy of nasal turbinates 01/30/2014   UARS (upper airway resistance syndrome) 10/04/2013   Insomnia secondary to depression with anxiety 10/04/2013   Exertional dyspnea 08/20/2013   History of rheumatic fever 08/20/2013   Hypomania (mild) single episode or unspecified 08/09/2013   OSA on CPAP 04/06/2013   Postoperative anemia due to acute blood loss 11/09/2012   OA (osteoarthritis) of knee 11/08/2012   History of giardia infection 07/15/2012   Atrophic vaginitis 07/15/2012   Vitreous degeneration of right eye    Nuclear sclerosis    Right shoulder pain 06/18/2011   Foot pain, left 06/18/2011   Hyperlipidemia 03/24/2011   Disorder of bone 02/12/2009   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  I can tell I am stronger.  The therapy has helped   Eval:  [name] in June. Have had  2 compression fx.    2 falls in one day early march 1st compression fx then again in June sustaining a second.  Has a membership here at [facility]. I have been Walking in pool 4 x week for 45 mins to 1 hr. But I still hurt. I want to work on my balance and stand up from a chair without using my arms.  Standing up from a chair my right knee and back hurts. Using rollator when walking back to pool but not any other time  PERTINENT HISTORY:  Evaluate and treat for L1 compression fx and right knee pain. Include aquatic therapy in treatment plan.  Chronic fatigue; fibromyalgia  PAIN:  Are you having pain? no: NPRS scale: 5-6/10 Pain location: neck, knees, back  Pain description: fibro vs OA I don't know what  Aggravating factors: transfering STS; in and out of bed; in and out of car Relieving factors: rest  PRECAUTIONS: Fall  RED FLAGS: None   WEIGHT BEARING RESTRICTIONS: No  FALLS:  Has patient fallen in last 6 months? Yes. Number of falls 1 fall backwards after putting my foot on a chair to toe my shoe.  LIVING ENVIRONMENT: Lives with: lives with their spouse Lives in: House/apartment Has following equipment at home: Single point cane, Environmental Consultant - 2 wheeled, Environmental Consultant - 4 wheeled, and Grab bars  OCCUPATION: retired financial controller  PLOF: Independent  PATIENT GOALS: Improve balance, get in and out of chairs, car and bed better  NEXT MD VISIT: as needed  OBJECTIVE:  Note: Objective measures were completed at Evaluation unless otherwise noted.  DIAGNOSTIC FINDINGS:  7/25 CT Lumbar IMPRESSION: 1. Subacute compression deformities at T12 and L1 with loss of height of 10% at each level. Sclerotic change consistent  with ongoing healing process. I cannot state by CT if there is complete healing. No retropulsed bone. 2. L3-4: Mild disc bulge and facet osteoarthritis. No compressive stenosis. 3. L4-5: Mild disc bulge. Bilateral facet degeneration and hypertrophy. 1-2 mm of degenerative anterolisthesis. Mild narrowing of both lateral recesses, not grossly compressive. 4. L5-S1: Endplate osteophytes and bulging of the disc. Bilateral facet osteoarthritis worse on the right. No compressive stenosis.  05/29/23 Xray R knee IMPRESSION: 1. 9.3 x 3.9 x 3.7 cm heterogenous layering hypodense fluid collection most compatible with a hematoma extending along the superficial fascia of the right posterolateral thigh, overlying and compressing the biceps femoris musculature, which could represent a soft tissue degloving injury, such as a Morel-Lavallee lesion. This collection appears to continue below the level of the knee into the lateral gastrocnemius muscle, concerning for intramuscular hematoma. 2. Intact right total knee arthroplasty. No acute osseous abnormality. 3. Small knee joint effusion.  PATIENT SURVEYS:  ODI: 23/50=46%  COGNITION: Overall cognitive status: Within functional limits for tasks assessed     SENSATION: WFL  MUSCLE LENGTH: Hamstrings: wfl   POSTURE: rounded shoulders, forward head, decreased thoracic kyphosis, and left pelvic obliquity  PALPATION: Right patella crepitus  LUMBAR ROM:   AROM eval 12/29/23  Flexion full   Extension    Right lateral flexion 90% limited P! 50% limited P!  Left lateral flexion 90% limited P! 50% limited P!  Right rotation    Left rotation     (Blank rows = not tested)  LOWER EXTREMITY ROM:     Active  Right eval Left eval  Hip flexion    Hip extension    Hip abduction    Hip adduction    Hip internal rotation    Hip external rotation    Knee flexion 110 128  Knee extension 0 0  Ankle dorsiflexion    Ankle plantarflexion    Ankle  inversion    Ankle eversion     (Blank rows = not tested)  LOWER EXTREMITY MMT:    MMT Right eval Left eval R / L 12/29/23  Hip flexion 3 3 3+ / 3+  Hip extension     Hip abduction 4+ 4+ 5- / 5-  Hip abd 4- 4- 5 /5  Hip internal rotation     Hip external rotation     Knee flexion 4 5 5   Knee extension 3+ 4 4  Ankle dorsiflexion     Ankle plantarflexion     Ankle inversion     Ankle eversion      (Blank rows = not tested)  LUMBAR SPECIAL TESTS:  Slump test: Negative  FUNCTIONAL TESTS:  30 seconds chair stand test Timed up and go (TUG): 21.24   Item Test date: 11/05/23 Date: 12/29/23 Date:   Sitting to standing 2. able to stand using hands after several tries 3. able to stand independently using hands  4. able to stand safely for 2 minutes  4. able to sit safely and securely for 2 minutes   3. controls descent by using hands  3. able to transfer safely with definite need of hands  4. able to stand 10 seconds safely  3. able to place feet together independently and stand 1 minute with supervision  4. can reach forward confidently 25 cm (10 inches)    2. unable to pick up but reaches 2-5 cm (1-2 inches) from slipper and keeps balance independently  3. looks behind one side only, other side shows less weight shift    1. needs close supervision or verbal cuing  0. needs assistance to keep from falling/unable to try    0. loses balance while stepping or standing   0. unable to try of needs assist to prevent fall     Insert SmartPhrase OPRCBERGREEVAL  2. Standing unsupported 4. able to stand safely for 2 minutes    3. Sitting with back /unsupported, feet supported 4. able to sit safely and securely for 2 minutes    4. Standing to sitting 2. uses back of legs against chair to control descent    5. Pivot transfer  3. able to transfer safely with definite need of hands    6. Standing unsupported with eyes closed 2. able to stand 3 seconds    7. Standing unsupported with  feet together 1. needs help to attain position but able to stand 15 seconds feet together    8. Reaching forward with outstretched arms while standing 2. can reach forward 5 cm (2 inches)    9. Pick up object from the floor from standing 3. able to pick up slipper but needs supervision    10. Turning to look behind over left and right shoulders while standing 3. looks behind one side only, other side shows less weight shift    11. Turn 360 degrees 1. needs close supervision or verbal cuing    12. Place alternate foot on step or stool while standing unsupported 0. needs assistance to keep from falling/unable to try    13. Standing unsupported one foot in  front 0. loses balance while stepping or standing    14. Standing on one leg 0. unable to try of needs assist to prevent fall      Total Score 27/56 Total Score:   34/56 Total Score:     GAIT: Distance walked: 400 ft Assistive device utilized: rollator Level of assistance: Complete Independence Comments: quickened short step length, reduced hip/knee flex, little arm swing  TREATMENT  [facility] Adult PT Treatment:                                              Date: 12/29/23  PN testing: BERG ; muscle; lumbar ROM   Pt seen for aquatic therapy today.  Treatment took place in water  3.5-4.75 ft in depth at the [facility] pool. Temp of water  was 91.  Pt entered/exited the pool via stairs with bil rail.  -walking forward and backward. Cues for arm swing -STS from bench onto water  step. VC for immediate standing balance -Tandem stance and SLS ue support RBHB then without -stair negotiation ascending and descending using alternating pattern with hand rails for safety.  Pt requires the buoyancy and hydrostatic pressure of water  for support, and to offload joints by unweighting joint load by at least 50 % in navel deep water  and by at least 75-80% in chest to neck deep water .  Viscosity of the water  is needed for resistance of strengthening.  Water  current perturbations provides challenge to standing balance requiring increased core activation.   12/24/23: - Nustep L5x8 minutes all four extremities for w/u  - LAQ with 1lb weight  - Marching with 1b weight on floor for 1 min, on airex for 1 min - 1 lap around clinic with gait belt with cognitive challenge - Step up onto 4 step alternating - Stepping over hurdles in fwd, lateral direction at bar.   12/18/23  Nustep L5x8 minutes all four extremities for w/u   Tandem stance solid surface 3x30 seconds B  Narrow BOS 4x30 seconds  Tandem walking x3 laps next to table, MinA from PT for balance Lateral weight shifts x60 seconds Narrow BOS blue foam pad 3x30 seconds SLS with one foot on blue yoga block 3x30 seconds B        Date: 12/15/23 Pt seen for aquatic therapy today.  Treatment took place in water  3.5-4.75 ft in depth at the [facility] pool. Temp of water  was 91.  Pt entered/exited the pool via stairs with bil rail. * pt completed walking 3 directions in lap pool for ~40 min prior to session   - tandem gait forward/ backward ->with rainbow hand floats (good balance challenge) - suitcase carry with bil rainbow hand float, marching forward / backward - UE on rainbow hand floats:  3 way toe touch, 2 x 5 each; forward walking kicks x 1 lap; hip abdct/ add x 10  - TrA set with hollow long noodle in standard stance x 8, staggered stance x 5 (difficulty maintaining posture/positioning)  - wall push up/off for reactive balance x 10 - alternating toe taps to 1st step x 10, without UE support (improved) - STS from 3rd step x 2 (required UE support to ascend) -> STS from bench in water  with feet on blue step (improved!) x 6  10/15  General concussion symptoms management, exercising under symptoms threshold, precautions/safety with current HEP  [facility] Adult PT  Treatment:                                             Date: 11/26/23 Pt seen for aquatic therapy today.   Treatment took place in water  3.5-4.75 ft in depth at the [facility] pool. Temp of water  was 91.  Pt entered/exited the pool via stairs with bil rail.  - unsupported (4 ft) walking forward/ backward, cues for vertical trunk, increased Rt step height, equal step length - unsupported side stepping with arm add/abdct -> with rainbow hand floats - cues for neutral Rt foot - suitcase carry with bil / single rainbow hand float, walking forward / backward - tandem stance with horz abdct/add with rainbow hand floats (good challenge) - tandem gait forward/ backward with rainbow hand floats (good balance challenge) - wall push up/off for reactive balance  - alternating toe taps to 1st step x 10, without UE support (good balance challenge in Rt SLS)  Pt requires the buoyancy and hydrostatic pressure of water  for support, and to offload joints by unweighting joint load by at least 50 % in navel deep water  and by at least 75-80% in chest to neck deep water .  Viscosity of the water  is needed for resistance of strengthening. Water  current perturbations provides challenge to standing balance requiring increased core activation.    11/17/23  Partial range bridges 2x10 PPT x15 Single leg clamshells red TB + TA set x10 B SAQs 4# x12 B  Wide tandem stance 3x30 seconds Narrow BOS solid surface 3x30 seconds  Nustep L5x6 minutes seat 9 BLEs only   9/15  Supine PPT 2x10 2s PPT with bridge 2x10 LTR 20x Supine piriformis 30s 3x each STS from elevated table 3x8  Daily walking program of at least 10 min per day   Eval Self care:Posture and body mechanic instruction; grab bars for commode; shower. STS transfers from chairs in restaurants; use of AD; safety precautions to avoid falls                                                                                                                             PATIENT EDUCATION:  Education details: intro to aquatic therapy   Person educated:  Patient Education method: Explanation, demonstration Education comprehension: verbalized understanding  HOME EXERCISE PROGRAM: Access Code: [email or web address] URL: [email or web address] Date: 11/09/2023 Prepared by: Dale Call  ASSESSMENT:  CLINICAL IMPRESSION: PN: pt progressing well with skilled physical therapy intervention.  Functional and objective testing as noted above demonstrate improvements in strength, lumbar ROM and balance reducing pt's fall risk.  She does report an additional fall in past month but reports tripping over an object in her path at home (without injury). She verbalizes noticing a difference in her function at home as her strength and balance has improved.  She is consistently using Rolator when out  of home for added safety. Pt will benefit from continued physical therapy to progress towards meeting all stated goals.      Eval: Patient is an 83 y.o. f who was seen today for physical therapy evaluation and treatment for back and right knee pain. PmHx includes bilater TKR >8-10 yrs ago and recent falls onto right knee sustaining T12 and L1 compression fx (earlier this year).  She is a member here at [facility] and comes 4 days a week and walks submerged x 45 -60 mins.   She presents with pain limited deficits in mid to lb and right knee  effecting strength, activity tolerance, gait, balance, and functional mobility with ADL's. Specifically she reports most difficulty with all transfers STS; sup to sit and getting out of car. Pt and cg given information on various grab bars for bed, commode and shower encouraging increased safety at home. As indicated by subjective information, and objective and outcome measure score, deficits identified are affecting and limiting overall functional mobility.    OBJECTIVE IMPAIRMENTS: Abnormal gait, decreased activity tolerance, decreased balance, decreased knowledge of use of DME, decreased mobility, difficulty walking, decreased ROM,  decreased strength, postural dysfunction, obesity, and pain.   ACTIVITY LIMITATIONS: carrying, lifting, bending, sitting, standing, squatting, stairs, and transfers  PARTICIPATION LIMITATIONS: meal prep, cleaning, laundry, driving, shopping, and community activity  PERSONAL FACTORS: Age and 1-2 comorbidities: see PmHx are also affecting patient's functional outcome.   REHAB POTENTIAL: Good  CLINICAL DECISION MAKING: Evolving/moderate complexity  EVALUATION COMPLEXITY: Moderate   GOALS: Goals reviewed with patient? Yes  SHORT TERM GOALS: Target date: 12/14/23 (delay in beginning therapy)  Pt will tolerate full aquatic sessions consistently without increase in pain and with improving function to demonstrate good toleration and effectiveness of intervention.  Baseline: Goal status: Met 12/29/23  2.  Pt will tolerate stair climbing in and out of pool using approp pattern ascending and descending 6 steps with use of handrail  Baseline:  Goal status: Met 12/29/23  3.  Pt will perform 10 STS from water  bench onto water  step unsupported without LOB Baseline:  Goal status:  Met 12/29/23  4.  Pt and cg will consider grab bars for commode  Baseline:  Goal status: deferred as pt reports she is rising off of a raised commode without difficulty 12/29/23  5.  Pt will be indep and compliant with initial land based HEP for improving strength and safety at home Baseline:  Goal status: Met 12/29/23    LONG TERM GOALS: Target date: 01/15/24  Pt to improve on ODI by 13% to demonstrate statistically significant Improvement in function. (MCID 13-15%) Baseline: 23/50=46% Goal status: INITIAL  2.  Pt will improve strength in bil hip flex and right knee ext by 1 grade to demonstrate improved overall physical function Baseline: see chart Goal status: INITIAL  3.  Pt will improve on Berg balance test to >/= 35/56 to demonstrate a decrease in fall risk. (MCD 7) Baseline:27/56 (medium fall risk) ;  34/56 Goal status: In progress 12/29/23  4.  Pt will improve on 30s STS test  from standard chair from 3 to 6 to demonstrate improving functional lower extremity strength, transitional movements, and balance. (MDC = 4.2sec)  Baseline: 3 Goal status: INITIAL  5.  Pt will report decrease in pain by at least 50% for improved toleration to activity/quality of life and to demonstrate improved management of pain. Baseline: see chart Goal status: INITIAL  6.  Pt will be indep with final HEP's (  land and aquatic as appropriate) for continued management of condition Baseline: see chart Goal status: INITIAL  PLAN:  PT FREQUENCY: 2x/week  PT DURATION: 10 w(extended out due to scheduling conflicts) Initial 8 sessions aquatic with 1 Land based for instruction on land based initial HEP. Then last 7-8 alternate  PLANNED INTERVENTIONS: 97164- PT Re-evaluation, 97750- Physical Performance Testing, 97110-Therapeutic exercises, 97530- Therapeutic activity, [id number]- Neuromuscular re-education, 97535- Self Care, [id number]- Manual therapy, [phone]- Gait training, [phone]- Aquatic Therapy, [phone]- Ionotophoresis 4mg /ml Dexamethasone , 79439 (1-2 muscles), 20561 (3+ muscles)- Dry Needling, Patient/Family education, Balance training, Stair training, Taping, Joint mobilization, DME instructions, Cryotherapy, and Moist heat.  PLAN FOR NEXT SESSION: aquatic: core and le strengthening, transfers, stair climbing, balance retraining  Land:  hip and knee strength, balance work, teaching laboratory technician, gait training  Ronal Foots) Nalanie Winiecki MPT 12/29/23 5:35 PM [facility] [address] [place], [place], [address] Phone: [phone]   Fax:  [phone]

## 2023-12-31 ENCOUNTER — Other Ambulatory Visit: Payer: Self-pay | Admitting: Sports Medicine

## 2023-12-31 DIAGNOSIS — M5030 Other cervical disc degeneration, unspecified cervical region: Secondary | ICD-10-CM

## 2023-12-31 DIAGNOSIS — M542 Cervicalgia: Secondary | ICD-10-CM

## 2024-01-01 ENCOUNTER — Telehealth (HOSPITAL_BASED_OUTPATIENT_CLINIC_OR_DEPARTMENT_OTHER): Payer: Self-pay

## 2024-01-01 ENCOUNTER — Ambulatory Visit (HOSPITAL_BASED_OUTPATIENT_CLINIC_OR_DEPARTMENT_OTHER): Admitting: Physical Therapy

## 2024-01-01 ENCOUNTER — Encounter (HOSPITAL_BASED_OUTPATIENT_CLINIC_OR_DEPARTMENT_OTHER): Payer: Self-pay | Admitting: Physical Therapy

## 2024-01-01 DIAGNOSIS — M5459 Other low back pain: Secondary | ICD-10-CM | POA: Diagnosis not present

## 2024-01-01 DIAGNOSIS — R2681 Unsteadiness on feet: Secondary | ICD-10-CM

## 2024-01-01 DIAGNOSIS — M6281 Muscle weakness (generalized): Secondary | ICD-10-CM

## 2024-01-01 NOTE — Therapy (Addendum)
 OUTPATIENT PHYSICAL THERAPY THORACOLUMBAR TREATMENT  Patient Name: [name] MRN: [id number] DOB:[birth year], 83 y.o., female Today's Date: 01/01/2024  END OF SESSION:  PT End of Session - 01/01/24 1434     Visit Number 11    Number of Visits 16    Date for Recertification  01/15/24    Authorization Type aetna Mcr    Progress Note Due on Visit 20    PT Start Time 1433    PT Stop Time 1515    PT Time Calculation (min) 42 min    Equipment Utilized During Treatment Gait belt    Activity Tolerance Patient tolerated treatment well    Behavior During Therapy WFL for tasks assessed/performed          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   Hyperlipidemia    Impaired hearing    left ear, wears hearing aids   Insomnia    circadian rhythm component   Insomnia    Migraine 11/1986   Spillman   Nuclear sclerosis    OSA on CPAP 03/2005   uses cpap setting of 10   Osteoarthritis 2007   Deveschwar   Pain last week   left under breast pain    Plantar fasciitis    Rheumatic fever    Scarlet fever as child   Sleep apnea    Swallowing difficulty    Thyroid  disorder    Vitamin D  deficiency    Vitreous degeneration of right eye    Past Surgical History:  Procedure Laterality Date   BREAST BIOPSY Left 6/00   [place]   BREAST EXCISIONAL BIOPSY Left 1998   DE QUERVAIN'S RELEASE Right 10/97   Sypher   left breast biopsy     ROOT CANAL  02/11/2012   TONSILLECTOMY  age 23   TONSILLECTOMY AND ADENOIDECTOMY  1952   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   TOTAL KNEE ARTHROPLASTY Right 11/08/2012   Procedure: RIGHT TOTAL KNEE ARTHROPLASTY;  Surgeon: Dempsey LULLA Moan, MD;  Location: [facility];  Service: Orthopedics;  Laterality: Right;    TUBAL LIGATION     Patient Active Problem List   Diagnosis Date Noted   Venous stasis of lower extremity 07/28/2023   Great toe pain, right 06/16/2023   Acute pain of right knee 05/28/2023   Closed compression fracture of body of first lumbar vertebra (HCC) 05/28/2023   Bilateral lower extremity edema 02/03/2022   Fluid retention 12/17/2021   Low back pain 10/03/2021   Atherosclerotic heart disease of native coronary artery without angina pectoris 05/27/2021   Diastolic dysfunction 05/27/2021   Neck pain 05/27/2021   Migraine 04/23/2021   Generalized obesity 04/23/2021   Depression 03/18/2021   Lumbar spine pain 08/30/2020   Flatulence, eructation and gas pain 05/07/2020   Change in bowel habit 05/07/2020   Gout 06/22/2019   Post viral syndrome 06/15/2019   Cervicalgia 06/15/2019   Chronic tension-type headache, intractable 06/15/2019   Fall 04/21/2018   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   Diverticulosis 12/23/2017   History of adenomatous polyp of colon 12/23/2017   Recurrent falls 12/14/2017   Degenerative disc disease, cervical 12/14/2017   Chronic constipation 12/14/2017   Floaters  in visual field, bilateral 08/10/2017   Head injury consultation 08/10/2017   Arthritis of carpometacarpal [facility]) joint of right thumb 05/14/2017   Therapeutic opioid-induced constipation (OIC) 02/05/2017   BMI 29.0-29.9,adult 02/05/2017   Dry mouth 02/05/2017   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/08/2017   Intolerance of continuous positive airway pressure (CPAP) ventilation 01/08/2017   Benign neoplasm of colon 11/11/2016   Other long term (current) drug therapy 11/11/2016   History of total knee arthroplasty, 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   Cough 03/06/2016   At risk for injury related to fall 02/14/2016   Primary osteoarthritis of both hands 02/04/2016   Osteopenia of multiple sites 02/04/2016   Metatarsalgia of both feet 02/04/2016   Migraines 02/04/2016   Other fatigue  02/01/2016   Melena 08/20/2015   Acute recurrent maxillary sinusitis 08/20/2015   Slow transit constipation 08/20/2015   Vitamin D  deficiency 08/20/2015   Thyroid  activity decreased 08/20/2015   Cephalalgia 08/20/2015   Preop cardiovascular exam 07/20/2015   Subacute ethmoidal sinusitis 12/11/2014   Chronic infection of sinus 07/12/2014   Nasal septal ulcer 05/25/2014   DJD (degenerative joint disease), cervical 03/22/2014   Right hand pain 03/22/2014   Deflected nasal septum 01/30/2014   Hypertrophy of nasal turbinates 01/30/2014   UARS (upper airway resistance syndrome) 10/04/2013   Insomnia secondary to depression with anxiety 10/04/2013   Exertional dyspnea 08/20/2013   History of rheumatic fever 08/20/2013   Hypomania (mild) single episode or unspecified 08/09/2013   OSA on CPAP 04/06/2013   Postoperative anemia due to acute blood loss 11/09/2012   OA (osteoarthritis) of knee 11/08/2012   History of giardia infection 07/15/2012   Atrophic vaginitis 07/15/2012   Vitreous degeneration of right eye    Nuclear sclerosis    Right shoulder pain 06/18/2011   Foot pain, left 06/18/2011   Hyperlipidemia 03/24/2011   Disorder of bone 02/12/2009   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   Migraine, unspecified, not intractable, without status migrainosus 02/12/2009    PCP: Garnette Ore MD  REFERRING PROVIDER: Helene Haddock MD  REFERRING DIAG:  [phone] (ICD-10-CM) - Acute pain of right knee  S32.010A (ICD-10-CM) - Closed compression fracture of body of L1 vertebra (HCC)    Rationale for Evaluation and Treatment: Rehabilitation  THERAPY DIAG:  Other low back pain  Muscle weakness (generalized)  Unsteadiness on feet  ONSET DATE: March 2025  SUBJECTIVE:                                                                                                                                                                                           SUBJECTIVE STATEMENT: Pain 4/10  feel better today .  I can tell I am stronger.  The therapy has helped   Eval:  [name] in June. Have had  2 compression fx.    2 falls in one day early march 1st compression fx then again in June sustaining a second.  Has a membership here at [facility]. I have been Walking in pool 4 x week for 45 mins to 1 hr. But I still hurt. I want to work on my balance and stand up from a chair without using my arms.  Standing up from a chair my right knee and back hurts. Using rollator when walking back to pool but not any other time  PERTINENT HISTORY:  Evaluate and treat for L1 compression fx and right knee pain. Include aquatic therapy in treatment plan.  Chronic fatigue; fibromyalgia  PAIN:  Are you having pain? no: NPRS scale: 5-6/10 Pain location: neck, knees, back  Pain description: fibro vs OA I don't know what  Aggravating factors: transfering STS; in and out of bed; in and out of car Relieving factors: rest  PRECAUTIONS: Fall  RED FLAGS: None   WEIGHT BEARING RESTRICTIONS: No  FALLS:  Has patient fallen in last 6 months? Yes. Number of falls 1 fall backwards after putting my foot on a chair to toe my shoe.  LIVING ENVIRONMENT: Lives with: lives with their spouse Lives in: House/apartment Has following equipment at home: Single point cane, Environmental Consultant - 2 wheeled, Environmental Consultant - 4 wheeled, and Grab bars  OCCUPATION: retired financial controller  PLOF: Independent  PATIENT GOALS: Improve balance, get in and out of chairs, car and bed better  NEXT MD VISIT: as needed  OBJECTIVE:  Note: Objective measures were completed at Evaluation unless otherwise noted.  DIAGNOSTIC FINDINGS:  7/25 CT Lumbar IMPRESSION: 1. Subacute compression deformities at T12 and L1 with loss of height of 10% at each level. Sclerotic change consistent with ongoing healing process. I cannot state by CT if there is complete healing. No retropulsed bone. 2. L3-4: Mild disc bulge and facet osteoarthritis. No  compressive stenosis. 3. L4-5: Mild disc bulge. Bilateral facet degeneration and hypertrophy. 1-2 mm of degenerative anterolisthesis. Mild narrowing of both lateral recesses, not grossly compressive. 4. L5-S1: Endplate osteophytes and bulging of the disc. Bilateral facet osteoarthritis worse on the right. No compressive stenosis.  05/29/23 Xray R knee IMPRESSION: 1. 9.3 x 3.9 x 3.7 cm heterogenous layering hypodense fluid collection most compatible with a hematoma extending along the superficial fascia of the right posterolateral thigh, overlying and compressing the biceps femoris musculature, which could represent a soft tissue degloving injury, such as a Morel-Lavallee lesion. This collection appears to continue below the level of the knee into the lateral gastrocnemius muscle, concerning for intramuscular hematoma. 2. Intact right total knee arthroplasty. No acute osseous abnormality. 3. Small knee joint effusion.  PATIENT SURVEYS:  ODI: 23/50=46%  COGNITION: Overall cognitive status: Within functional limits for tasks assessed     SENSATION: WFL  MUSCLE LENGTH: Hamstrings: wfl   POSTURE: rounded shoulders, forward head, decreased thoracic kyphosis, and left pelvic obliquity  PALPATION: Right patella crepitus  LUMBAR ROM:   AROM eval 12/29/23  Flexion full   Extension    Right lateral flexion 90% limited P! 50% limited P!  Left lateral flexion 90% limited P! 50% limited P!  Right rotation    Left rotation     (Blank rows = not tested)  LOWER EXTREMITY ROM:     Active  Right eval Left  eval  Hip flexion    Hip extension    Hip abduction    Hip adduction    Hip internal rotation    Hip external rotation    Knee flexion 110 128  Knee extension 0 0  Ankle dorsiflexion    Ankle plantarflexion    Ankle inversion    Ankle eversion     (Blank rows = not tested)  LOWER EXTREMITY MMT:    MMT Right eval Left eval R / L 12/29/23  Hip flexion 3 3 3+ / 3+   Hip extension     Hip abduction 4+ 4+ 5- / 5-  Hip abd 4- 4- 5 /5  Hip internal rotation     Hip external rotation     Knee flexion 4 5 5   Knee extension 3+ 4 4  Ankle dorsiflexion     Ankle plantarflexion     Ankle inversion     Ankle eversion      (Blank rows = not tested)  LUMBAR SPECIAL TESTS:  Slump test: Negative  FUNCTIONAL TESTS:  30 seconds chair stand test Timed up and go (TUG): 21.24   Item Test date: 11/05/23 Date: 12/29/23 Date:   Sitting to standing 2. able to stand using hands after several tries 3. able to stand independently using hands  4. able to stand safely for 2 minutes  4. able to sit safely and securely for 2 minutes   3. controls descent by using hands  3. able to transfer safely with definite need of hands  4. able to stand 10 seconds safely  3. able to place feet together independently and stand 1 minute with supervision  4. can reach forward confidently 25 cm (10 inches)    2. unable to pick up but reaches 2-5 cm (1-2 inches) from slipper and keeps balance independently  3. looks behind one side only, other side shows less weight shift    1. needs close supervision or verbal cuing  0. needs assistance to keep from falling/unable to try    0. loses balance while stepping or standing   0. unable to try of needs assist to prevent fall     Insert SmartPhrase OPRCBERGREEVAL  2. Standing unsupported 4. able to stand safely for 2 minutes    3. Sitting with back /unsupported, feet supported 4. able to sit safely and securely for 2 minutes    4. Standing to sitting 2. uses back of legs against chair to control descent    5. Pivot transfer  3. able to transfer safely with definite need of hands    6. Standing unsupported with eyes closed 2. able to stand 3 seconds    7. Standing unsupported with feet together 1. needs help to attain position but able to stand 15 seconds feet together    8. Reaching forward with outstretched arms while standing 2.  can reach forward 5 cm (2 inches)    9. Pick up object from the floor from standing 3. able to pick up slipper but needs supervision    10. Turning to look behind over left and right shoulders while standing 3. looks behind one side only, other side shows less weight shift    11. Turn 360 degrees 1. needs close supervision or verbal cuing    12. Place alternate foot on step or stool while standing unsupported 0. needs assistance to keep from falling/unable to try    13. Standing unsupported one foot in front 0. loses  balance while stepping or standing    14. Standing on one leg 0. unable to try of needs assist to prevent fall      Total Score 27/56 Total Score:   34/56 Total Score:     GAIT: Distance walked: 400 ft Assistive device utilized: rollator Level of assistance: Complete Independence Comments: quickened short step length, reduced hip/knee flex, little arm swing  TREATMENT  [facility] Adult PT Treatment:                                              01/01/24 STM to glutes, lumbar paraspinals  Standing marches x20 Standing hip abduction x10 Gait training in hallway  Gait training with hurdles and blue airex pad    Date: 12/29/23  PN testing: BERG ; muscle; lumbar ROM   Pt seen for aquatic therapy today.  Treatment took place in water  3.5-4.75 ft in depth at the [facility] pool. Temp of water  was 91.  Pt entered/exited the pool via stairs with bil rail.  -walking forward and backward. Cues for arm swing -STS from bench onto water  step. VC for immediate standing balance -Tandem stance and SLS ue support RBHB then without -stair negotiation ascending and descending using alternating pattern with hand rails for safety.  Pt requires the buoyancy and hydrostatic pressure of water  for support, and to offload joints by unweighting joint load by at least 50 % in navel deep water  and by at least 75-80% in chest to neck deep water .  Viscosity of the water  is needed for resistance  of strengthening. Water  current perturbations provides challenge to standing balance requiring increased core activation.   12/24/23: - Nustep L5x8 minutes all four extremities for w/u  - LAQ with 1lb weight  - Marching with 1b weight on floor for 1 min, on airex for 1 min - 1 lap around clinic with gait belt with cognitive challenge - Step up onto 4 step alternating - Stepping over hurdles in fwd, lateral direction at bar.   12/18/23  Nustep L5x8 minutes all four extremities for w/u   Tandem stance solid surface 3x30 seconds B  Narrow BOS 4x30 seconds  Tandem walking x3 laps next to table, MinA from PT for balance Lateral weight shifts x60 seconds Narrow BOS blue foam pad 3x30 seconds SLS with one foot on blue yoga block 3x30 seconds B        Date: 12/15/23 Pt seen for aquatic therapy today.  Treatment took place in water  3.5-4.75 ft in depth at the [facility] pool. Temp of water  was 91.  Pt entered/exited the pool via stairs with bil rail. * pt completed walking 3 directions in lap pool for ~40 min prior to session   - tandem gait forward/ backward ->with rainbow hand floats (good balance challenge) - suitcase carry with bil rainbow hand float, marching forward / backward - UE on rainbow hand floats:  3 way toe touch, 2 x 5 each; forward walking kicks x 1 lap; hip abdct/ add x 10  - TrA set with hollow long noodle in standard stance x 8, staggered stance x 5 (difficulty maintaining posture/positioning)  - wall push up/off for reactive balance x 10 - alternating toe taps to 1st step x 10, without UE support (improved) - STS from 3rd step x 2 (required UE support to ascend) -> STS from bench in water   with feet on blue step (improved!) x 6  10/15  General concussion symptoms management, exercising under symptoms threshold, precautions/safety with current HEP  [facility] Adult PT Treatment:                                             Date: 11/26/23 Pt seen for aquatic  therapy today.  Treatment took place in water  3.5-4.75 ft in depth at the [facility] pool. Temp of water  was 91.  Pt entered/exited the pool via stairs with bil rail.  - unsupported (4 ft) walking forward/ backward, cues for vertical trunk, increased Rt step height, equal step length - unsupported side stepping with arm add/abdct -> with rainbow hand floats - cues for neutral Rt foot - suitcase carry with bil / single rainbow hand float, walking forward / backward - tandem stance with horz abdct/add with rainbow hand floats (good challenge) - tandem gait forward/ backward with rainbow hand floats (good balance challenge) - wall push up/off for reactive balance  - alternating toe taps to 1st step x 10, without UE support (good balance challenge in Rt SLS)  Pt requires the buoyancy and hydrostatic pressure of water  for support, and to offload joints by unweighting joint load by at least 50 % in navel deep water  and by at least 75-80% in chest to neck deep water .  Viscosity of the water  is needed for resistance of strengthening. Water  current perturbations provides challenge to standing balance requiring increased core activation.    11/17/23  Partial range bridges 2x10 PPT x15 Single leg clamshells red TB + TA set x10 B SAQs 4# x12 B  Wide tandem stance 3x30 seconds Narrow BOS solid surface 3x30 seconds  Nustep L5x6 minutes seat 9 BLEs only   9/15  Supine PPT 2x10 2s PPT with bridge 2x10 LTR 20x Supine piriformis 30s 3x each STS from elevated table 3x8  Daily walking program of at least 10 min per day   Eval Self care:Posture and body mechanic instruction; grab bars for commode; shower. STS transfers from chairs in restaurants; use of AD; safety precautions to avoid falls                                                                                                                             PATIENT EDUCATION:  Education details: intro to aquatic therapy    Person educated: Patient Education method: Explanation, demonstration Education comprehension: verbalized understanding  HOME EXERCISE PROGRAM: Access Code: [email or web address] URL: [email or web address] Date: 11/09/2023 Prepared by: Dale Call  ASSESSMENT:  CLINICAL IMPRESSION: Tolerated exercises well with no significant increase in pain. Utilized manual therapy to decrease hyperactive glutes/lumbar musculature. Verbal cueing to prevent trendelenburg compensation and improve gait. Gait training to address balance deficits. Discussed right reactions and ability to counteract balance when she feels like she is going to fall. Will  continue to benefit from therapy to address remaining limitations.       Eval: Patient is an 83 y.o. f who was seen today for physical therapy evaluation and treatment for back and right knee pain. PmHx includes bilater TKR >8-10 yrs ago and recent falls onto right knee sustaining T12 and L1 compression fx (earlier this year).  She is a member here at [facility] and comes 4 days a week and walks submerged x 45 -60 mins.   She presents with pain limited deficits in mid to lb and right knee  effecting strength, activity tolerance, gait, balance, and functional mobility with ADL's. Specifically she reports most difficulty with all transfers STS; sup to sit and getting out of car. Pt and cg given information on various grab bars for bed, commode and shower encouraging increased safety at home. As indicated by subjective information, and objective and outcome measure score, deficits identified are affecting and limiting overall functional mobility.    OBJECTIVE IMPAIRMENTS: Abnormal gait, decreased activity tolerance, decreased balance, decreased knowledge of use of DME, decreased mobility, difficulty walking, decreased ROM, decreased strength, postural dysfunction, obesity, and pain.   ACTIVITY LIMITATIONS: carrying, lifting, bending, sitting, standing, squatting,  stairs, and transfers  PARTICIPATION LIMITATIONS: meal prep, cleaning, laundry, driving, shopping, and community activity  PERSONAL FACTORS: Age and 1-2 comorbidities: see PmHx are also affecting patient's functional outcome.   REHAB POTENTIAL: Good  CLINICAL DECISION MAKING: Evolving/moderate complexity  EVALUATION COMPLEXITY: Moderate   GOALS: Goals reviewed with patient? Yes  SHORT TERM GOALS: Target date: 12/14/23 (delay in beginning therapy)  Pt will tolerate full aquatic sessions consistently without increase in pain and with improving function to demonstrate good toleration and effectiveness of intervention.  Baseline: Goal status: Met 12/29/23  2.  Pt will tolerate stair climbing in and out of pool using approp pattern ascending and descending 6 steps with use of handrail  Baseline:  Goal status: Met 12/29/23  3.  Pt will perform 10 STS from water  bench onto water  step unsupported without LOB Baseline:  Goal status:  Met 12/29/23  4.  Pt and cg will consider grab bars for commode  Baseline:  Goal status: deferred as pt reports she is rising off of a raised commode without difficulty 12/29/23  5.  Pt will be indep and compliant with initial land based HEP for improving strength and safety at home Baseline:  Goal status: Met 12/29/23    LONG TERM GOALS: Target date: 01/15/24  Pt to improve on ODI by 13% to demonstrate statistically significant Improvement in function. (MCID 13-15%) Baseline: 23/50=46% Goal status: INITIAL  2.  Pt will improve strength in bil hip flex and right knee ext by 1 grade to demonstrate improved overall physical function Baseline: see chart Goal status: INITIAL  3.  Pt will improve on Berg balance test to >/= 35/56 to demonstrate a decrease in fall risk. (MCD 7) Baseline:27/56 (medium fall risk) ; 34/56 Goal status: In progress 12/29/23  4.  Pt will improve on 30s STS test  from standard chair from 3 to 6 to demonstrate improving  functional lower extremity strength, transitional movements, and balance. (MDC = 4.2sec)  Baseline: 3 Goal status: INITIAL  5.  Pt will report decrease in pain by at least 50% for improved toleration to activity/quality of life and to demonstrate improved management of pain. Baseline: see chart Goal status: INITIAL  6.  Pt will be indep with final HEP's (land and aquatic as appropriate) for continued management of  condition Baseline: see chart Goal status: INITIAL  PLAN:  PT FREQUENCY: 2x/week  PT DURATION: 10 w(extended out due to scheduling conflicts) Initial 8 sessions aquatic with 1 Land based for instruction on land based initial HEP. Then last 7-8 alternate  PLANNED INTERVENTIONS: 97164- PT Re-evaluation, 97750- Physical Performance Testing, 97110-Therapeutic exercises, 97530- Therapeutic activity, 97112- Neuromuscular re-education, 97535- Self Care, [id number]- Manual therapy, [phone]- Gait training, [phone]- Aquatic Therapy, [phone]- Ionotophoresis 4mg /ml Dexamethasone , 79439 (1-2 muscles), [id number] (3+ muscles)- Dry Needling, Patient/Family education, Balance training, Stair training, Taping, Joint mobilization, DME instructions, Cryotherapy, and Moist heat.  PLAN FOR NEXT SESSION: aquatic: core and le strengthening, transfers, stair climbing, balance retraining  Land:  continue to progress balance and glute/quad strength    Lili Finder, Student-PT 01/01/2024, 3:17 PM  I have reviewed and concur with this student's documentation.   Alm JINNY Don, PT 01/03/2024 8:34 AM   During this treatment session, the therapist was present, participating in and directing the treatment.

## 2024-01-03 ENCOUNTER — Encounter (HOSPITAL_BASED_OUTPATIENT_CLINIC_OR_DEPARTMENT_OTHER): Payer: Self-pay | Admitting: Physical Therapy

## 2024-01-05 ENCOUNTER — Ambulatory Visit (HOSPITAL_BASED_OUTPATIENT_CLINIC_OR_DEPARTMENT_OTHER): Admitting: Physical Therapy

## 2024-01-05 ENCOUNTER — Encounter (HOSPITAL_BASED_OUTPATIENT_CLINIC_OR_DEPARTMENT_OTHER): Payer: Self-pay | Admitting: Physical Therapy

## 2024-01-05 ENCOUNTER — Encounter: Payer: Self-pay | Admitting: Sports Medicine

## 2024-01-05 DIAGNOSIS — M5459 Other low back pain: Secondary | ICD-10-CM | POA: Diagnosis not present

## 2024-01-05 DIAGNOSIS — G8929 Other chronic pain: Secondary | ICD-10-CM

## 2024-01-05 DIAGNOSIS — R2681 Unsteadiness on feet: Secondary | ICD-10-CM

## 2024-01-05 DIAGNOSIS — M6281 Muscle weakness (generalized): Secondary | ICD-10-CM

## 2024-01-05 NOTE — Therapy (Signed)
 OUTPATIENT PHYSICAL THERAPY THORACOLUMBAR TREATMENT   RE-CERT   Patient Name: [name] MRN: [id number] DOB:03-07-1940, 83 y.o., female Today's Date: 01/05/2024  END OF SESSION:  PT End of Session - 01/05/24 1217     Visit Number 12    Number of Visits 16    Date for Recertification  03/18/24    Authorization Type aetna Mcr    Progress Note Due on Visit 20    PT Start Time 1105    PT Stop Time 1144    PT Time Calculation (min) 39 min    Equipment Utilized During Treatment Gait belt    Activity Tolerance Patient tolerated treatment well    Behavior During Therapy WFL for tasks assessed/performed           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   Hyperlipidemia    Impaired hearing    left ear, wears hearing aids   Insomnia    circadian rhythm component   Insomnia    Migraine 11/1986   Spillman   Nuclear sclerosis    OSA on CPAP 03/2005   uses cpap setting of 10   Osteoarthritis 2007   Deveschwar   Pain last week   left under breast pain    Plantar fasciitis    Rheumatic fever    Scarlet fever as child   Sleep apnea    Swallowing difficulty    Thyroid  disorder    Vitamin D  deficiency    Vitreous degeneration of right eye    Past Surgical History:  Procedure Laterality Date   BREAST BIOPSY Left 6/00   [place]   BREAST EXCISIONAL BIOPSY Left 1998   DE QUERVAIN'S RELEASE Right 10/97   Sypher   left breast biopsy     ROOT CANAL  02/11/2012   TONSILLECTOMY  age 51   TONSILLECTOMY AND ADENOIDECTOMY  1952   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   TOTAL KNEE ARTHROPLASTY Right 11/08/2012   Procedure: RIGHT TOTAL KNEE ARTHROPLASTY;  Surgeon: Dempsey LULLA Moan, MD;  Location: [facility];  Service: Orthopedics;   Laterality: Right;   TUBAL LIGATION     Patient Active Problem List   Diagnosis Date Noted   Venous stasis of lower extremity 07/28/2023   Great toe pain, right 06/16/2023   Acute pain of right knee 05/28/2023   Closed compression fracture of body of first lumbar vertebra (HCC) 05/28/2023   Bilateral lower extremity edema 02/03/2022   Fluid retention 12/17/2021   Low back pain 10/03/2021   Atherosclerotic heart disease of native coronary artery without angina pectoris 05/27/2021   Diastolic dysfunction 05/27/2021   Neck pain 05/27/2021   Migraine 04/23/2021   Generalized obesity 04/23/2021   Depression 03/18/2021   Lumbar spine pain 08/30/2020   Flatulence, eructation and gas pain 05/07/2020   Change in bowel habit 05/07/2020   Gout 06/22/2019   Post viral syndrome 06/15/2019   Cervicalgia 06/15/2019   Chronic tension-type headache, intractable 06/15/2019   Fall 04/21/2018   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   Diverticulosis 12/23/2017   History of adenomatous polyp of colon 12/23/2017   Recurrent falls 12/14/2017   Degenerative disc disease, cervical 12/14/2017   Chronic  constipation 12/14/2017   Floaters in visual field, bilateral 08/10/2017   Head injury consultation 08/10/2017   Arthritis of carpometacarpal [facility]) joint of right thumb 05/14/2017   Therapeutic opioid-induced constipation (OIC) 02/05/2017   BMI 29.0-29.9,adult 02/05/2017   Dry mouth 02/05/2017   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/08/2017   Intolerance of continuous positive airway pressure (CPAP) ventilation 01/08/2017   Benign neoplasm of colon 11/11/2016   Other long term (current) drug therapy 11/11/2016   History of total knee arthroplasty, 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   Cough 03/06/2016   At risk for injury related to fall 02/14/2016   Primary osteoarthritis of both hands 02/04/2016   Osteopenia of multiple sites 02/04/2016   Metatarsalgia of both feet 02/04/2016   Migraines 02/04/2016    Other fatigue 02/01/2016   Melena 08/20/2015   Acute recurrent maxillary sinusitis 08/20/2015   Slow transit constipation 08/20/2015   Vitamin D  deficiency 08/20/2015   Thyroid  activity decreased 08/20/2015   Cephalalgia 08/20/2015   Preop cardiovascular exam 07/20/2015   Subacute ethmoidal sinusitis 12/11/2014   Chronic infection of sinus 07/12/2014   Nasal septal ulcer 05/25/2014   DJD (degenerative joint disease), cervical 03/22/2014   Right hand pain 03/22/2014   Deflected nasal septum 01/30/2014   Hypertrophy of nasal turbinates 01/30/2014   UARS (upper airway resistance syndrome) 10/04/2013   Insomnia secondary to depression with anxiety 10/04/2013   Exertional dyspnea 08/20/2013   History of rheumatic fever 08/20/2013   Hypomania (mild) single episode or unspecified 08/09/2013   OSA on CPAP 04/06/2013   Postoperative anemia due to acute blood loss 11/09/2012   OA (osteoarthritis) of knee 11/08/2012   History of giardia infection 07/15/2012   Atrophic vaginitis 07/15/2012   Vitreous degeneration of right eye    Nuclear sclerosis    Right shoulder pain 06/18/2011   Foot pain, left 06/18/2011   Hyperlipidemia 03/24/2011   Disorder of bone 02/12/2009   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  Felt good after last session   Eval:  Fell in June. Have had  2 compression fx.    2 falls in one day early march 1st compression fx then again in June sustaining a second.  Has a membership here at [facility]. I have been Walking in pool 4 x week for 45 mins to 1 hr. But I still hurt. I want to work on my balance and stand up from a chair without using my arms.  Standing up from a chair my right knee and back hurts. Using rollator when walking back to pool but not any other time  PERTINENT HISTORY:  Evaluate and treat for L1 compression fx and right knee pain. Include aquatic therapy in treatment plan.  Chronic fatigue; fibromyalgia  PAIN:  Are you having pain? no: NPRS scale: 5-6/10 Pain location: neck, knees, back  Pain description: fibro vs OA I don't know what  Aggravating factors: transfering STS; in and out of bed; in and out of car Relieving factors: rest  PRECAUTIONS: Fall  RED FLAGS: None   WEIGHT BEARING RESTRICTIONS: No  FALLS:  Has patient fallen in last 6 months? Yes. Number of falls 1 fall backwards after putting my foot on a chair to toe my shoe.  LIVING ENVIRONMENT: Lives with: lives with their spouse Lives in: House/apartment Has following equipment at home: Single point cane, Environmental Consultant - 2 wheeled, Environmental Consultant - 4 wheeled, and Grab bars  OCCUPATION: retired financial controller  PLOF: Independent  PATIENT GOALS: Improve balance, get in and out of chairs, car and bed better  NEXT MD VISIT: as needed  OBJECTIVE:  Note: Objective measures were completed at Evaluation unless otherwise noted.  DIAGNOSTIC FINDINGS:  7/25 CT Lumbar IMPRESSION: 1. Subacute compression deformities at T12 and L1 with loss of height of 10% at each level. Sclerotic change consistent with ongoing healing process. I cannot state by CT if there is complete healing. No retropulsed bone. 2. L3-4: Mild disc bulge and facet  osteoarthritis. No compressive stenosis. 3. L4-5: Mild disc bulge. Bilateral facet degeneration and hypertrophy. 1-2 mm of degenerative anterolisthesis. Mild narrowing of both lateral recesses, not grossly compressive. 4. L5-S1: Endplate osteophytes and bulging of the disc. Bilateral facet osteoarthritis worse on the right. No compressive stenosis.  05/29/23 Xray R knee IMPRESSION: 1. 9.3 x 3.9 x 3.7 cm heterogenous layering hypodense fluid collection most compatible with a hematoma extending along the superficial fascia of the right posterolateral thigh, overlying and compressing the biceps femoris musculature, which could represent a soft tissue degloving injury, such as a Morel-Lavallee lesion. This collection appears to continue below the level of the knee into the lateral gastrocnemius muscle, concerning for intramuscular hematoma. 2. Intact right total knee arthroplasty. No acute osseous abnormality. 3. Small knee joint effusion.  PATIENT SURVEYS:  ODI: 23/50=46%  COGNITION: Overall cognitive status: Within functional limits for tasks assessed     SENSATION: WFL  MUSCLE LENGTH: Hamstrings: wfl   POSTURE: rounded shoulders, forward head, decreased thoracic kyphosis, and left pelvic obliquity  PALPATION: Right patella crepitus  LUMBAR ROM:   AROM eval 12/29/23  Flexion full   Extension    Right lateral flexion 90% limited P! 50% limited P!  Left lateral flexion 90% limited P! 50% limited P!  Right rotation    Left rotation     (Blank rows = not tested)  LOWER EXTREMITY ROM:     Active  Right eval Left eval  Hip flexion    Hip extension    Hip abduction    Hip adduction    Hip internal rotation    Hip external rotation    Knee flexion 110 128  Knee extension 0 0  Ankle dorsiflexion    Ankle plantarflexion    Ankle inversion    Ankle eversion     (Blank rows = not tested)  LOWER EXTREMITY MMT:    MMT Right eval Left eval R / L 12/29/23  Hip  flexion 3 3 3+ / 3+  Hip extension     Hip abduction 4+ 4+ 5- / 5-  Hip abd 4- 4- 5 /5  Hip internal rotation     Hip external rotation     Knee flexion 4 5 5   Knee extension 3+ 4 4  Ankle dorsiflexion     Ankle plantarflexion     Ankle inversion     Ankle eversion      (Blank rows = not tested)  LUMBAR SPECIAL TESTS:  Slump test: Negative  FUNCTIONAL TESTS:  30 seconds chair stand test Timed up and go (TUG): 21.24   Item Test date: 11/05/23 Date: 12/29/23 Date:   Sitting to standing 2. able to stand using hands after several tries 3. able to stand independently using hands  4. able to stand safely for 2 minutes  4. able to sit safely and securely for 2 minutes   3. controls descent by using hands  3. able to transfer safely with definite need of hands  4. able to stand 10 seconds safely  3. able to place feet together independently and stand 1 minute with supervision  4. can reach forward confidently 25 cm (10 inches)    2. unable to pick up but reaches 2-5 cm (1-2 inches) from slipper and keeps balance independently  3. looks behind one side only, other side shows less weight shift    1. needs close supervision or verbal cuing  0. needs assistance to keep from falling/unable to try    0. loses balance while stepping or standing   0. unable to try of needs assist to prevent fall     Insert SmartPhrase OPRCBERGREEVAL  2. Standing unsupported 4. able to stand safely for 2 minutes    3. Sitting with back /unsupported, feet supported 4. able to sit safely and securely for 2 minutes    4. Standing to sitting 2. uses back of legs against chair to control descent    5. Pivot transfer  3. able to transfer safely with definite need of hands    6. Standing unsupported with eyes closed 2. able to stand 3 seconds    7. Standing unsupported with feet together 1. needs help to attain position but able to stand 15 seconds feet together    8. Reaching forward with outstretched arms  while standing 2. can reach forward 5 cm (2 inches)    9. Pick up object from the floor from standing 3. able to pick up slipper but needs supervision    10. Turning to look behind over left and right shoulders while standing 3. looks behind one side only, other side shows less weight shift    11. Turn 360 degrees 1. needs close supervision or verbal cuing    12. Place alternate foot on step or stool while standing unsupported 0. needs assistance to keep from falling/unable to try    13. Standing unsupported one foot in front 0. loses balance while  stepping or standing    14. Standing on one leg 0. unable to try of needs assist to prevent fall      Total Score 27/56 Total Score:   34/56 Total Score:     GAIT: Distance walked: 400 ft Assistive device utilized: rollator Level of assistance: Complete Independence Comments: quickened short step length, reduced hip/knee flex, little arm swing  TREATMENT  [facility] Adult PT Treatment:                                               Date: 01/05/24 Pt seen for aquatic therapy today.  Treatment took place in water  3.5-4.75 ft in depth at the [facility] pool. Temp of water  was 91.  Pt entered/exited the pool via stairs with bil rail.  -walking forward and back -Stair negotiation alternating steps ue support intermittently on handrails (iincreasingly as decreased submersion) -step up leading R/L 2 x 5. VC for reduced UE support - tandem gait forward/ backward ->with rainbow hand floats (good balance challenge) - alternating toe taps to 1st step x 10, without UE support->2nd step -L stretch-> hip hiking - wall push up/off for reactive balance x 10 - TrA set with hollow long noodle in standard stance x 10, staggered stance x 8 (improving balance and control good challenge)  - suitcase carry with bil rainbow hand float, marching forward / backward -walking forward and back between exercises fro recovery  01/01/24 STM to glutes, lumbar  paraspinals  Standing marches x20 Standing hip abduction x10 Gait training in hallway  Gait training with hurdles and blue airex pad    Date: 12/29/23  PN testing: BERG ; muscle; lumbar ROM   Pt seen for aquatic therapy today.  Treatment took place in water  3.5-4.75 ft in depth at the [facility] pool. Temp of water  was 91.  Pt entered/exited the pool via stairs with bil rail.  -walking forward and backward. Cues for arm swing -STS from bench onto water  step. VC for immediate standing balance -Tandem stance and SLS ue support RBHB then without -stair negotiation ascending and descending using alternating pattern with hand rails for safety.  Pt requires the buoyancy and hydrostatic pressure of water  for support, and to offload joints by unweighting joint load by at least 50 % in navel deep water  and by at least 75-80% in chest to neck deep water .  Viscosity of the water  is needed for resistance of strengthening. Water  current perturbations provides challenge to standing balance requiring increased core activation.   12/24/23: - Nustep L5x8 minutes all four extremities for w/u  - LAQ with 1lb weight  - Marching with 1b weight on floor for 1 min, on airex for 1 min - 1 lap around clinic with gait belt with cognitive challenge - Step up onto 4 step alternating - Stepping over hurdles in fwd, lateral direction at bar.   12/18/23  Nustep L5x8 minutes all four extremities for w/u   Tandem stance solid surface 3x30 seconds B  Narrow BOS 4x30 seconds  Tandem walking x3 laps next to table, MinA from PT for balance Lateral weight shifts x60 seconds Narrow BOS blue foam pad 3x30 seconds SLS with one foot on blue yoga block 3x30 seconds B        Date: 12/15/23 Pt seen for aquatic therapy today.  Treatment took place in water  3.5-4.75 ft in  depth at the [facility] pool. Temp of water  was 91.  Pt entered/exited the pool via stairs with bil rail. * pt completed  walking 3 directions in lap pool for ~40 min prior to session   - tandem gait forward/ backward ->with rainbow hand floats (good balance challenge) - suitcase carry with bil rainbow hand float, marching forward / backward - UE on rainbow hand floats:  3 way toe touch, 2 x 5 each; forward walking kicks x 1 lap; hip abdct/ add x 10  - TrA set with hollow long noodle in standard stance x 8, staggered stance x 5 (difficulty maintaining posture/positioning)  - wall push up/off for reactive balance x 10 - alternating toe taps to 1st step x 10, without UE support (improved) - STS from 3rd step x 2 (required UE support to ascend) -> STS from bench in water  with feet on blue step (improved!) x 6  10/15  General concussion symptoms management, exercising under symptoms threshold, precautions/safety with current HEP  [facility] Adult PT Treatment:                                             Date: 11/26/23 Pt seen for aquatic therapy today.  Treatment took place in water  3.5-4.75 ft in depth at the [facility] pool. Temp of water  was 91.  Pt entered/exited the pool via stairs with bil rail.  - unsupported (4 ft) walking forward/ backward, cues for vertical trunk, increased Rt step height, equal step length - unsupported side stepping with arm add/abdct -> with rainbow hand floats - cues for neutral Rt foot - suitcase carry with bil / single rainbow hand float, walking forward / backward - tandem stance with horz abdct/add with rainbow hand floats (good challenge) - tandem gait forward/ backward with rainbow hand floats (good balance challenge) - wall push up/off for reactive balance  - alternating toe taps to 1st step x 10, without UE support (good balance challenge in Rt SLS)  Pt requires the buoyancy and hydrostatic pressure of water  for support, and to offload joints by unweighting joint load by at least 50 % in navel deep water  and by at least 75-80% in chest to neck deep water .  Viscosity of the  water  is needed for resistance of strengthening. Water  current perturbations provides challenge to standing balance requiring increased core activation.    11/17/23  Partial range bridges 2x10 PPT x15 Single leg clamshells red TB + TA set x10 B SAQs 4# x12 B  Wide tandem stance 3x30 seconds Narrow BOS solid surface 3x30 seconds  Nustep L5x6 minutes seat 9 BLEs only   9/15  Supine PPT 2x10 2s PPT with bridge 2x10 LTR 20x Supine piriformis 30s 3x each STS from elevated table 3x8  Daily walking program of at least 10 min per day   Eval Self care:Posture and body mechanic instruction; grab bars for commode; shower. STS transfers from chairs in restaurants; use of AD; safety precautions to avoid falls  PATIENT EDUCATION:  Education details: intro to aquatic therapy   Person educated: Patient Education method: Explanation, demonstration Education comprehension: verbalized understanding  HOME EXERCISE PROGRAM: Access Code: [email or web address] URL: [email or web address] Date: 11/09/2023 Prepared by: Dale Call  ASSESSMENT:  CLINICAL IMPRESSION: Focus on balance with LE strengthening. She is able to challenge her balance without fear of falling using properties of water . She fatigues by end of session. Requires cues for mindfulness to maintain upright stance and engage core. Added L stretching due to reports of increased LBP with good reduction in pain.    Re-cert/PN: pt progressing well with skilled physical therapy intervention. Functional and objective testing as noted above demonstrate improvements in strength, lumbar ROM and balance reducing pt's fall risk. She does report an additional fall in past month but reports tripping over an object in her path at home (without injury). She verbalizes noticing a difference in her function at home as her  strength and balance has improved. She is consistently using Rolator when out of home for added safety. Pt will benefit from continued physical therapy to progress towards meeting all stated goals.        OBJECTIVE IMPAIRMENTS: Abnormal gait, decreased activity tolerance, decreased balance, decreased knowledge of use of DME, decreased mobility, difficulty walking, decreased ROM, decreased strength, postural dysfunction, obesity, and pain.   ACTIVITY LIMITATIONS: carrying, lifting, bending, sitting, standing, squatting, stairs, and transfers  PARTICIPATION LIMITATIONS: meal prep, cleaning, laundry, driving, shopping, and community activity  PERSONAL FACTORS: Age and 1-2 comorbidities: see PmHx are also affecting patient's functional outcome.   REHAB POTENTIAL: Good  CLINICAL DECISION MAKING: Evolving/moderate complexity  EVALUATION COMPLEXITY: Moderate   GOALS: Goals reviewed with patient? Yes  SHORT TERM GOALS: Target date: 12/14/23 (delay in beginning therapy)  Pt will tolerate full aquatic sessions consistently without increase in pain and with improving function to demonstrate good toleration and effectiveness of intervention.  Baseline: Goal status: Met 12/29/23  2.  Pt will tolerate stair climbing in and out of pool using approp pattern ascending and descending 6 steps with use of handrail  Baseline:  Goal status: Met 12/29/23  3.  Pt will perform 10 STS from water  bench onto water  step unsupported without LOB Baseline:  Goal status:  Met 12/29/23  4.  Pt and cg will consider grab bars for commode  Baseline:  Goal status: deferred as pt reports she is rising off of a raised commode without difficulty 12/29/23  5.  Pt will be indep and compliant with initial land based HEP for improving strength and safety at home Baseline:  Goal status: Met 12/29/23    LONG TERM GOALS: Target date: 03/19/23  Pt to improve on ODI by 13% to demonstrate statistically significant  Improvement in function. (MCID 13-15%) Baseline: 23/50=46% Goal status: INITIAL  2.  Pt will improve strength in bil hip flex and right knee ext by 1 grade to demonstrate improved overall physical function Baseline: see chart Goal status: INITIAL  3.  Pt will improve on Berg balance test to >/= 35/56 to demonstrate a decrease in fall risk. (MCD 7) Baseline:27/56 (medium fall risk) ; 34/56 Goal status: In progress 12/29/23  4.  Pt will improve on 30s STS test  from standard chair from 3 to 6 to demonstrate improving functional lower extremity strength, transitional movements, and balance. (MDC = 4.2sec)  Baseline: 3 Goal status: INITIAL  5.  Pt will report decrease in pain by at least 50% for improved toleration to activity/quality of life and to  demonstrate improved management of pain. Baseline: see chart Goal status: INITIAL  6.  Pt will be indep with final HEP's (land and aquatic as appropriate) for continued management of condition Baseline: see chart Goal status: INITIAL  PLAN:  PT FREQUENCY: 2x/week  PT DURATION: 10 w(extended out due to holiday schedule) alternate land/aquatics  PLANNED INTERVENTIONS: 97164- PT Re-evaluation, 97750- Physical Performance Testing, 97110-Therapeutic exercises, 97530- Therapeutic activity, 97112- Neuromuscular re-education, 97535- Self Care, [id number]- Manual therapy, [id number]- Gait training, [phone]- Aquatic Therapy, [phone]- Ionotophoresis 4mg /ml Dexamethasone , 79439 (1-2 muscles), 20561 (3+ muscles)- Dry Needling, Patient/Family education, Balance training, Stair training, Taping, Joint mobilization, DME instructions, Cryotherapy, and Moist heat.  PLAN FOR NEXT SESSION: aquatic: core and le strengthening, transfers, stair climbing, balance retraining  Land:  continue to progress balance and glute/quad strength    Ronal Kem) Lamin Chandley MPT 01/05/24 12:32 PM [facility] [address] [place], [place],  [address] Phone: [phone]   Fax:  [phone]

## 2024-01-06 ENCOUNTER — Other Ambulatory Visit: Payer: Self-pay

## 2024-01-06 ENCOUNTER — Other Ambulatory Visit: Payer: Self-pay | Admitting: *Deleted

## 2024-01-06 ENCOUNTER — Other Ambulatory Visit: Payer: Self-pay | Admitting: Sports Medicine

## 2024-01-06 DIAGNOSIS — M810 Age-related osteoporosis without current pathological fracture: Secondary | ICD-10-CM

## 2024-01-07 ENCOUNTER — Ambulatory Visit (HOSPITAL_BASED_OUTPATIENT_CLINIC_OR_DEPARTMENT_OTHER): Admitting: Physical Therapy

## 2024-01-07 ENCOUNTER — Other Ambulatory Visit: Payer: Self-pay | Admitting: Sports Medicine

## 2024-01-07 DIAGNOSIS — M542 Cervicalgia: Secondary | ICD-10-CM

## 2024-01-07 DIAGNOSIS — G8929 Other chronic pain: Secondary | ICD-10-CM

## 2024-01-07 DIAGNOSIS — M5030 Other cervical disc degeneration, unspecified cervical region: Secondary | ICD-10-CM

## 2024-01-07 DIAGNOSIS — M5459 Other low back pain: Secondary | ICD-10-CM | POA: Diagnosis not present

## 2024-01-07 DIAGNOSIS — M6281 Muscle weakness (generalized): Secondary | ICD-10-CM

## 2024-01-07 DIAGNOSIS — R2681 Unsteadiness on feet: Secondary | ICD-10-CM

## 2024-01-07 MED ORDER — DOXEPIN HCL 50 MG PO CAPS: ORAL_CAPSULE | ORAL | 1 refills | Status: AC

## 2024-01-07 MED ORDER — TRAMADOL HCL 50 MG PO TABS: 50.0000 mg | ORAL_TABLET | Freq: Three times a day (TID) | ORAL | 2 refills | Status: AC

## 2024-01-07 NOTE — Progress Notes (Signed)
 Will stick with 50 mg bid and 2 at bedtime unless she needs the 100 mg to lessen # of pills.  Tramadol  refilled

## 2024-01-07 NOTE — Therapy (Signed)
 OUTPATIENT PHYSICAL THERAPY THORACOLUMBAR TREATMENT   RE-CERT   Patient Name: [name] MRN: [id number] DOB:12/08/1940, 83 y.o., female Today's Date: 01/08/2024  END OF SESSION:  PT End of Session - 01/08/24 0758     Visit Number 13    Number of Visits 16    Date for Recertification  03/18/24    Authorization Type aetna Mcr    PT Start Time 1315    PT Stop Time 1358    PT Time Calculation (min) 43 min    Activity Tolerance Patient tolerated treatment well    Behavior During Therapy [facility] for tasks assessed/performed            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   Hyperlipidemia    Impaired hearing    left ear, wears hearing aids   Insomnia    circadian rhythm component   Insomnia    Migraine 11/1986   Spillman   Nuclear sclerosis    OSA on CPAP 03/2005   uses cpap setting of 10   Osteoarthritis 2007   Deveschwar   Pain last week   left under breast pain    Plantar fasciitis    Rheumatic fever    Scarlet fever as child   Sleep apnea    Swallowing difficulty    Thyroid  disorder    Vitamin D  deficiency    Vitreous degeneration of right eye    Past Surgical History:  Procedure Laterality Date   BREAST BIOPSY Left 6/00   Hardcastle   BREAST EXCISIONAL BIOPSY Left 1998   DE QUERVAIN'S RELEASE Right 10/97   Sypher   left breast biopsy     ROOT CANAL  02/11/2012   TONSILLECTOMY  age 74   TONSILLECTOMY AND ADENOIDECTOMY  1952   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   TOTAL KNEE ARTHROPLASTY Right 11/08/2012   Procedure: RIGHT TOTAL KNEE ARTHROPLASTY;  Surgeon: Dempsey LULLA Moan, MD;  Location: [facility];  Service: Orthopedics;  Laterality: Right;   TUBAL LIGATION     Patient Active Problem List   Diagnosis Date  Noted   Venous stasis of lower extremity 07/28/2023   Great toe pain, right 06/16/2023   Acute pain of right knee 05/28/2023   Closed compression fracture of body of first lumbar vertebra (HCC) 05/28/2023   Bilateral lower extremity edema 02/03/2022   Fluid retention 12/17/2021   Low back pain 10/03/2021   Atherosclerotic heart disease of native coronary artery without angina pectoris 05/27/2021   Diastolic dysfunction 05/27/2021   Neck pain 05/27/2021   Migraine 04/23/2021   Generalized obesity 04/23/2021   Depression 03/18/2021   Lumbar spine pain 08/30/2020   Flatulence, eructation and gas pain 05/07/2020   Change in bowel habit 05/07/2020   Gout 06/22/2019   Post viral syndrome 06/15/2019   Cervicalgia 06/15/2019   Chronic tension-type headache, intractable 06/15/2019   Fall 04/21/2018   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   Diverticulosis 12/23/2017   History of adenomatous polyp of colon 12/23/2017   Recurrent falls 12/14/2017   Degenerative disc disease, cervical 12/14/2017   Chronic constipation 12/14/2017   Floaters in visual field, bilateral 08/10/2017   Head injury consultation 08/10/2017  Arthritis of carpometacarpal (CMC) joint of right thumb 05/14/2017   Therapeutic opioid-induced constipation (OIC) 02/05/2017   BMI 29.0-29.9,adult 02/05/2017   Dry mouth 02/05/2017   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/08/2017   Intolerance of continuous positive airway pressure (CPAP) ventilation 01/08/2017   Benign neoplasm of colon 11/11/2016   Other long term (current) drug therapy 11/11/2016   History of total knee arthroplasty, 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   Cough 03/06/2016   At risk for injury related to fall 02/14/2016   Primary osteoarthritis of both hands 02/04/2016   Osteopenia of multiple sites 02/04/2016   Metatarsalgia of both feet 02/04/2016   Migraines 02/04/2016   Other fatigue 02/01/2016   Melena 08/20/2015   Acute recurrent maxillary sinusitis  08/20/2015   Slow transit constipation 08/20/2015   Vitamin D  deficiency 08/20/2015   Thyroid  activity decreased 08/20/2015   Cephalalgia 08/20/2015   Preop cardiovascular exam 07/20/2015   Subacute ethmoidal sinusitis 12/11/2014   Chronic infection of sinus 07/12/2014   Nasal septal ulcer 05/25/2014   DJD (degenerative joint disease), cervical 03/22/2014   Right hand pain 03/22/2014   Deflected nasal septum 01/30/2014   Hypertrophy of nasal turbinates 01/30/2014   UARS (upper airway resistance syndrome) 10/04/2013   Insomnia secondary to depression with anxiety 10/04/2013   Exertional dyspnea 08/20/2013   History of rheumatic fever 08/20/2013   Hypomania (mild) single episode or unspecified 08/09/2013   OSA on CPAP 04/06/2013   Postoperative anemia due to acute blood loss 11/09/2012   OA (osteoarthritis) of knee 11/08/2012   History of giardia infection 07/15/2012   Atrophic vaginitis 07/15/2012   Vitreous degeneration of right eye    Nuclear sclerosis    Right shoulder pain 06/18/2011   Foot pain, left 06/18/2011   Hyperlipidemia 03/24/2011   Disorder of bone 02/12/2009   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   Migraine, unspecified, not intractable, without status migrainosus 02/12/2009    PCP: Garnette Ore MD  REFERRING PROVIDER: Helene Haddock MD  REFERRING DIAG:  [phone] (ICD-10-CM) - Acute pain of right knee  S32.010A (ICD-10-CM) - Closed compression fracture of body of L1 vertebra (HCC)    Rationale for Evaluation and Treatment: Rehabilitation  THERAPY DIAG:  Other low back pain  Muscle weakness (generalized)  Unsteadiness on feet  Chronic pain of right knee  ONSET DATE: March 2025  SUBJECTIVE:                                                                                                                                                                                           SUBJECTIVE STATEMENT: The patient reports she felt looser after the last  visit. She did some back stretches in the pool which helped.    EvalBETHA Rasmussen in June. Have had  2 compression fx.    2 falls in one day early march 1st compression fx then again in June sustaining a second.  Has a membership here at [facility]. I have been Walking in pool 4 x week for 45 mins to 1 hr. But I still hurt. I want to work on my balance and stand up from a chair without using my arms.  Standing up from a chair my right knee and back hurts. Using rollator when walking back to pool but not any other time  PERTINENT HISTORY:  Evaluate and treat for L1 compression fx and right knee pain. Include aquatic therapy in treatment plan.  Chronic fatigue; fibromyalgia  PAIN:  Are you having pain? no: NPRS scale: 5-6/10 Pain location: neck, knees, back  Pain description: fibro vs OA I don't know what  Aggravating factors: transfering STS; in and out of bed; in and out of car Relieving factors: rest  PRECAUTIONS: Fall  RED FLAGS: None   WEIGHT BEARING RESTRICTIONS: No  FALLS:  Has patient fallen in last 6 months? Yes. Number of falls 1 fall backwards after putting my foot on a chair to toe my shoe.  LIVING ENVIRONMENT: Lives with: lives with their spouse Lives in: House/apartment Has following equipment at home: Single point cane, Environmental Consultant - 2 wheeled, Environmental Consultant - 4 wheeled, and Grab bars  OCCUPATION: retired financial controller  PLOF: Independent  PATIENT GOALS: Improve balance, get in and out of chairs, car and bed better  NEXT MD VISIT: as needed  OBJECTIVE:  Note: Objective measures were completed at Evaluation unless otherwise noted.  DIAGNOSTIC FINDINGS:  7/25 CT Lumbar IMPRESSION: 1. Subacute compression deformities at T12 and L1 with loss of height of 10% at each level. Sclerotic change consistent with ongoing healing process. I cannot state by CT if there is complete healing. No retropulsed bone. 2. L3-4: Mild disc bulge and facet osteoarthritis. No  compressive stenosis. 3. L4-5: Mild disc bulge. Bilateral facet degeneration and hypertrophy. 1-2 mm of degenerative anterolisthesis. Mild narrowing of both lateral recesses, not grossly compressive. 4. L5-S1: Endplate osteophytes and bulging of the disc. Bilateral facet osteoarthritis worse on the right. No compressive stenosis.  05/29/23 Xray R knee IMPRESSION: 1. 9.3 x 3.9 x 3.7 cm heterogenous layering hypodense fluid collection most compatible with a hematoma extending along the superficial fascia of the right posterolateral thigh, overlying and compressing the biceps femoris musculature, which could represent a soft tissue degloving injury, such as a Morel-Lavallee lesion. This collection appears to continue below the level of the knee into the lateral gastrocnemius muscle, concerning for intramuscular hematoma. 2. Intact right total knee arthroplasty. No acute osseous abnormality. 3. Small knee joint effusion.  PATIENT SURVEYS:  ODI: 23/50=46%  COGNITION: Overall cognitive status: Within functional limits for tasks assessed     SENSATION: WFL  MUSCLE LENGTH: Hamstrings: wfl   POSTURE: rounded shoulders, forward head, decreased thoracic kyphosis, and left pelvic obliquity  PALPATION: Right patella crepitus  LUMBAR ROM:   AROM eval 12/29/23  Flexion full   Extension    Right lateral flexion 90% limited P! 50% limited P!  Left lateral flexion 90% limited P! 50% limited P!  Right rotation    Left rotation     (Blank rows = not tested)  LOWER EXTREMITY ROM:     Active  Right eval Left eval  Hip flexion  Hip extension    Hip abduction    Hip adduction    Hip internal rotation    Hip external rotation    Knee flexion 110 128  Knee extension 0 0  Ankle dorsiflexion    Ankle plantarflexion    Ankle inversion    Ankle eversion     (Blank rows = not tested)  LOWER EXTREMITY MMT:    MMT Right eval Left eval R / L 12/29/23  Hip flexion 3 3 3+ / 3+   Hip extension     Hip abduction 4+ 4+ 5- / 5-  Hip abd 4- 4- 5 /5  Hip internal rotation     Hip external rotation     Knee flexion 4 5 5   Knee extension 3+ 4 4  Ankle dorsiflexion     Ankle plantarflexion     Ankle inversion     Ankle eversion      (Blank rows = not tested)  LUMBAR SPECIAL TESTS:  Slump test: Negative  FUNCTIONAL TESTS:  30 seconds chair stand test Timed up and go (TUG): 21.24   Item Test date: 11/05/23 Date: 12/29/23 Date:   Sitting to standing 2. able to stand using hands after several tries 3. able to stand independently using hands  4. able to stand safely for 2 minutes  4. able to sit safely and securely for 2 minutes   3. controls descent by using hands  3. able to transfer safely with definite need of hands  4. able to stand 10 seconds safely  3. able to place feet together independently and stand 1 minute with supervision  4. can reach forward confidently 25 cm (10 inches)    2. unable to pick up but reaches 2-5 cm (1-2 inches) from slipper and keeps balance independently  3. looks behind one side only, other side shows less weight shift    1. needs close supervision or verbal cuing  0. needs assistance to keep from falling/unable to try    0. loses balance while stepping or standing   0. unable to try of needs assist to prevent fall     Insert SmartPhrase OPRCBERGREEVAL  2. Standing unsupported 4. able to stand safely for 2 minutes    3. Sitting with back /unsupported, feet supported 4. able to sit safely and securely for 2 minutes    4. Standing to sitting 2. uses back of legs against chair to control descent    5. Pivot transfer  3. able to transfer safely with definite need of hands    6. Standing unsupported with eyes closed 2. able to stand 3 seconds    7. Standing unsupported with feet together 1. needs help to attain position but able to stand 15 seconds feet together    8. Reaching forward with outstretched arms while standing 2.  can reach forward 5 cm (2 inches)    9. Pick up object from the floor from standing 3. able to pick up slipper but needs supervision    10. Turning to look behind over left and right shoulders while standing 3. looks behind one side only, other side shows less weight shift    11. Turn 360 degrees 1. needs close supervision or verbal cuing    12. Place alternate foot on step or stool while standing unsupported 0. needs assistance to keep from falling/unable to try    13. Standing unsupported one foot in front 0. loses balance while stepping or standing  14. Standing on one leg 0. unable to try of needs assist to prevent fall      Total Score 27/56 Total Score:   34/56 Total Score:     GAIT: Distance walked: 400 ft Assistive device utilized: rollator Level of assistance: Complete Independence Comments: quickened short step length, reduced hip/knee flex, little arm swing  TREATMENT  [facility] Adult PT Treatment:                                              11/13  There-ex:  Lumbar ball stretch x 5 sec hold  Lateral ball stretch 5x 5 sec hold  Neuro re-ed:  LAQ 2x10 each leg  Hip abduction 2x10 red  Balance:  Narrow base of support EO and EC 2x30 seconds each   Tandem stance EO 2x30 sec hold    Date: 01/05/24 Pt seen for aquatic therapy today.  Treatment took place in water  3.5-4.75 ft in depth at the [facility] pool. Temp of water  was 91.  Pt entered/exited the pool via stairs with bil rail.  -walking forward and back -Stair negotiation alternating steps ue support intermittently on handrails (iincreasingly as decreased submersion) -step up leading R/L 2 x 5. VC for reduced UE support - tandem gait forward/ backward ->with rainbow hand floats (good balance challenge) - alternating toe taps to 1st step x 10, without UE support->2nd step -L stretch-> hip hiking - wall push up/off for reactive balance x 10 - TrA set with hollow long noodle in standard stance x 10,  staggered stance x 8 (improving balance and control good challenge)  - suitcase carry with bil rainbow hand float, marching forward / backward -walking forward and back between exercises fro recovery  01/01/24 STM to glutes, lumbar paraspinals  Standing marches x20 Standing hip abduction x10 Gait training in hallway  Gait training with hurdles and blue airex pad    Date: 12/29/23  PN testing: BERG ; muscle; lumbar ROM   Pt seen for aquatic therapy today.  Treatment took place in water  3.5-4.75 ft in depth at the [facility] pool. Temp of water  was 91.  Pt entered/exited the pool via stairs with bil rail.  -walking forward and backward. Cues for arm swing -STS from bench onto water  step. VC for immediate standing balance -Tandem stance and SLS ue support RBHB then without -stair negotiation ascending and descending using alternating pattern with hand rails for safety.  Pt requires the buoyancy and hydrostatic pressure of water  for support, and to offload joints by unweighting joint load by at least 50 % in navel deep water  and by at least 75-80% in chest to neck deep water .  Viscosity of the water  is needed for resistance of strengthening. Water  current perturbations provides challenge to standing balance requiring increased core activation.   12/24/23: - Nustep L5x8 minutes all four extremities for w/u  - LAQ with 1lb weight  - Marching with 1b weight on floor for 1 min, on airex for 1 min - 1 lap around clinic with gait belt with cognitive challenge - Step up onto 4 step alternating - Stepping over hurdles in fwd, lateral direction at bar.   12/18/23  Nustep L5x8 minutes all four extremities for w/u   Tandem stance solid surface 3x30 seconds B  Narrow BOS 4x30 seconds  Tandem walking x3 laps next to table, MinA from PT  for balance Lateral weight shifts x60 seconds Narrow BOS blue foam pad 3x30 seconds SLS with one foot on blue yoga block 3x30 seconds B         Date: 12/15/23 Pt seen for aquatic therapy today.  Treatment took place in water  3.5-4.75 ft in depth at the [facility] pool. Temp of water  was 91.  Pt entered/exited the pool via stairs with bil rail. * pt completed walking 3 directions in lap pool for ~40 min prior to session   - tandem gait forward/ backward ->with rainbow hand floats (good balance challenge) - suitcase carry with bil rainbow hand float, marching forward / backward - UE on rainbow hand floats:  3 way toe touch, 2 x 5 each; forward walking kicks x 1 lap; hip abdct/ add x 10  - TrA set with hollow long noodle in standard stance x 8, staggered stance x 5 (difficulty maintaining posture/positioning)  - wall push up/off for reactive balance x 10 - alternating toe taps to 1st step x 10, without UE support (improved) - STS from 3rd step x 2 (required UE support to ascend) -> STS from bench in water  with feet on blue step (improved!) x 6  10/15  General concussion symptoms management, exercising under symptoms threshold, precautions/safety with current HEP  [facility] Adult PT Treatment:                                             Date: 11/26/23 Pt seen for aquatic therapy today.  Treatment took place in water  3.5-4.75 ft in depth at the [facility] pool. Temp of water  was 91.  Pt entered/exited the pool via stairs with bil rail.  - unsupported (4 ft) walking forward/ backward, cues for vertical trunk, increased Rt step height, equal step length - unsupported side stepping with arm add/abdct -> with rainbow hand floats - cues for neutral Rt foot - suitcase carry with bil / single rainbow hand float, walking forward / backward - tandem stance with horz abdct/add with rainbow hand floats (good challenge) - tandem gait forward/ backward with rainbow hand floats (good balance challenge) - wall push up/off for reactive balance  - alternating toe taps to 1st step x 10, without UE support (good balance  challenge in Rt SLS)  Pt requires the buoyancy and hydrostatic pressure of water  for support, and to offload joints by unweighting joint load by at least 50 % in navel deep water  and by at least 75-80% in chest to neck deep water .  Viscosity of the water  is needed for resistance of strengthening. Water  current perturbations provides challenge to standing balance requiring increased core activation.  PATIENT EDUCATION:  Education details: intro to aquatic therapy   Person educated: Patient Education method: Explanation, demonstration Education comprehension: verbalized understanding  HOME EXERCISE PROGRAM: Access Code: [email or web address] URL: [email or web address] Date: 11/09/2023 Prepared by: Dale Call  ASSESSMENT:  CLINICAL IMPRESSION: Patient reported that she had been given a stretch for her back in the pool.  We gave her a similar type of stretches to do at her kitchen table at home.  She tolerated well.  We also reviewed self soft tissue mobilization.  She has significant tightness in her bilateral paraspinals.  She was shown how to use a Thera cane to work those muscles.  We also reviewed where she can buy a Thera cane.  Therapy gave her sitting series of exercises to work around for lower extremity strengthening and reviewed posture with these exercises.  We also reviewed to balance exercises.  She is advised to hold on balance exercises so we have some time to practice some here more.  We did put them on her HEP.  Re-cert/PN: pt progressing well with skilled physical therapy intervention. Functional and objective testing as noted above demonstrate improvements in strength, lumbar ROM and balance reducing pt's fall risk. She does report an additional fall in past month but reports tripping over an object in her path at home (without injury). She  verbalizes noticing a difference in her function at home as her strength and balance has improved. She is consistently using Rolator when out of home for added safety. Pt will benefit from continued physical therapy to progress towards meeting all stated goals.        OBJECTIVE IMPAIRMENTS: Abnormal gait, decreased activity tolerance, decreased balance, decreased knowledge of use of DME, decreased mobility, difficulty walking, decreased ROM, decreased strength, postural dysfunction, obesity, and pain.   ACTIVITY LIMITATIONS: carrying, lifting, bending, sitting, standing, squatting, stairs, and transfers  PARTICIPATION LIMITATIONS: meal prep, cleaning, laundry, driving, shopping, and community activity  PERSONAL FACTORS: Age and 1-2 comorbidities: see PmHx are also affecting patient's functional outcome.   REHAB POTENTIAL: Good  CLINICAL DECISION MAKING: Evolving/moderate complexity  EVALUATION COMPLEXITY: Moderate   GOALS: Goals reviewed with patient? Yes  SHORT TERM GOALS: Target date: 12/14/23 (delay in beginning therapy)  Pt will tolerate full aquatic sessions consistently without increase in pain and with improving function to demonstrate good toleration and effectiveness of intervention.  Baseline: Goal status: Met 12/29/23  2.  Pt will tolerate stair climbing in and out of pool using approp pattern ascending and descending 6 steps with use of handrail  Baseline:  Goal status: Met 12/29/23  3.  Pt will perform 10 STS from water  bench onto water  step unsupported without LOB Baseline:  Goal status:  Met 12/29/23  4.  Pt and cg will consider grab bars for commode  Baseline:  Goal status: deferred as pt reports she is rising off of a raised commode without difficulty 12/29/23  5.  Pt will be indep and compliant with initial land based HEP for improving strength and safety at home Baseline:  Goal status: Met 12/29/23    LONG TERM GOALS: Target date: 03/19/23  Pt to improve  on ODI by 13% to demonstrate statistically significant Improvement in function. (MCID 13-15%) Baseline: 23/50=46% Goal status: INITIAL  2.  Pt will improve strength in bil hip flex and right knee ext by 1 grade to demonstrate improved overall physical function Baseline: see chart Goal status: INITIAL  3.  Pt will improve on Berg balance test to >/= 35/56 to  demonstrate a decrease in fall risk. (MCD 7) Baseline:27/56 (medium fall risk) ; 34/56 Goal status: In progress 12/29/23  4.  Pt will improve on 30s STS test  from standard chair from 3 to 6 to demonstrate improving functional lower extremity strength, transitional movements, and balance. (MDC = 4.2sec)  Baseline: 3 Goal status: INITIAL  5.  Pt will report decrease in pain by at least 50% for improved toleration to activity/quality of life and to demonstrate improved management of pain. Baseline: see chart Goal status: INITIAL  6.  Pt will be indep with final HEP's (land and aquatic as appropriate) for continued management of condition Baseline: see chart Goal status: INITIAL  PLAN:  PT FREQUENCY: 2x/week  PT DURATION: 10 w(extended out due to holiday schedule) alternate land/aquatics  PLANNED INTERVENTIONS: 97164- PT Re-evaluation, 97750- Physical Performance Testing, 97110-Therapeutic exercises, 97530- Therapeutic activity, 97112- Neuromuscular re-education, 97535- Self Care, 02859- Manual therapy, [id number]- Gait training, [phone]- Aquatic Therapy, [phone]- Ionotophoresis 4mg /ml Dexamethasone , 79439 (1-2 muscles), 20561 (3+ muscles)- Dry Needling, Patient/Family education, Balance training, Stair training, Taping, Joint mobilization, DME instructions, Cryotherapy, and Moist heat.  PLAN FOR NEXT SESSION: aquatic: core and le strengthening, transfers, stair climbing, balance retraining  Land:  continue to progress balance and glute/quad strength    Ronal Kem) Ziemba MPT 01/08/24 7:59 AM [facility] [address] [place], [place], [address] Phone: [phone]   Fax:  [phone]

## 2024-01-08 ENCOUNTER — Encounter (HOSPITAL_BASED_OUTPATIENT_CLINIC_OR_DEPARTMENT_OTHER): Payer: Self-pay | Admitting: Physical Therapy

## 2024-01-13 NOTE — Therapy (Signed)
 OUTPATIENT PHYSICAL THERAPY THORACOLUMBAR TREATMENT    Patient Name: [name] MRN: [id number] DOB:04/19/1940, 83 y.o., female Today's Date: 01/15/2024  END OF SESSION:  PT End of Session - 01/15/24 1106     Visit Number 14    Number of Visits 16    Date for Recertification  03/18/24    Authorization Type aetna Mcr    Progress Note Due on Visit 20    PT Start Time 1058    PT Stop Time 1143    PT Time Calculation (min) 45 min    Equipment Utilized During Treatment Gait belt    Activity Tolerance Patient tolerated treatment well    Behavior During Therapy WFL for tasks assessed/performed             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   Hyperlipidemia    Impaired hearing    left ear, wears hearing aids   Insomnia    circadian rhythm component   Insomnia    Migraine 11/1986   Spillman   Nuclear sclerosis    OSA on CPAP 03/2005   uses cpap setting of 10   Osteoarthritis 2007   Deveschwar   Pain last week   left under breast pain    Plantar fasciitis    Rheumatic fever    Scarlet fever as child   Sleep apnea    Swallowing difficulty    Thyroid  disorder    Vitamin D  deficiency    Vitreous degeneration of right eye    Past Surgical History:  Procedure Laterality Date   BREAST BIOPSY Left 6/00   [place]   BREAST EXCISIONAL BIOPSY Left 1998   DE QUERVAIN'S RELEASE Right 10/97   Sypher   left breast biopsy     ROOT CANAL  02/11/2012   TONSILLECTOMY  age 42   TONSILLECTOMY AND ADENOIDECTOMY  1952   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   TOTAL KNEE ARTHROPLASTY Right 11/08/2012   Procedure: RIGHT TOTAL KNEE ARTHROPLASTY;  Surgeon: Dempsey LULLA Moan, MD;  Location: [facility];  Service: Orthopedics;  Laterality:  Right;   TUBAL LIGATION     Patient Active Problem List   Diagnosis Date Noted   Venous stasis of lower extremity 07/28/2023   Great toe pain, right 06/16/2023   Acute pain of right knee 05/28/2023   Closed compression fracture of body of first lumbar vertebra (HCC) 05/28/2023   Bilateral lower extremity edema 02/03/2022   Fluid retention 12/17/2021   Low back pain 10/03/2021   Atherosclerotic heart disease of native coronary artery without angina pectoris 05/27/2021   Diastolic dysfunction 05/27/2021   Neck pain 05/27/2021   Migraine 04/23/2021   Generalized obesity 04/23/2021   Depression 03/18/2021   Lumbar spine pain 08/30/2020   Flatulence, eructation and gas pain 05/07/2020   Change in bowel habit 05/07/2020   Gout 06/22/2019   Post viral syndrome 06/15/2019   Cervicalgia 06/15/2019   Chronic tension-type headache, intractable 06/15/2019   Fall 04/21/2018   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   Diverticulosis 12/23/2017   History of adenomatous polyp of colon 12/23/2017   Recurrent falls 12/14/2017   Degenerative disc disease, cervical 12/14/2017   Chronic  constipation 12/14/2017   Floaters in visual field, bilateral 08/10/2017   Head injury consultation 08/10/2017   Arthritis of carpometacarpal [facility] joint of right thumb 05/14/2017   Therapeutic opioid-induced constipation (OIC) 02/05/2017   BMI 29.0-29.9,adult 02/05/2017   Dry mouth 02/05/2017   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/08/2017   Intolerance of continuous positive airway pressure (CPAP) ventilation 01/08/2017   Benign neoplasm of colon 11/11/2016   Other long term (current) drug therapy 11/11/2016   History of total knee arthroplasty, 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   Cough 03/06/2016   At risk for injury related to fall 02/14/2016   Primary osteoarthritis of both hands 02/04/2016   Osteopenia of multiple sites 02/04/2016   Metatarsalgia of both feet 02/04/2016   Migraines 02/04/2016   Other  fatigue 02/01/2016   Melena 08/20/2015   Acute recurrent maxillary sinusitis 08/20/2015   Slow transit constipation 08/20/2015   Vitamin D  deficiency 08/20/2015   Thyroid  activity decreased 08/20/2015   Cephalalgia 08/20/2015   Preop cardiovascular exam 07/20/2015   Subacute ethmoidal sinusitis 12/11/2014   Chronic infection of sinus 07/12/2014   Nasal septal ulcer 05/25/2014   DJD (degenerative joint disease), cervical 03/22/2014   Right hand pain 03/22/2014   Deflected nasal septum 01/30/2014   Hypertrophy of nasal turbinates 01/30/2014   UARS (upper airway resistance syndrome) 10/04/2013   Insomnia secondary to depression with anxiety 10/04/2013   Exertional dyspnea 08/20/2013   History of rheumatic fever 08/20/2013   Hypomania (mild) single episode or unspecified 08/09/2013   OSA on CPAP 04/06/2013   Postoperative anemia due to acute blood loss 11/09/2012   OA (osteoarthritis) of knee 11/08/2012   History of giardia infection 07/15/2012   Atrophic vaginitis 07/15/2012   Vitreous degeneration of right eye    Nuclear sclerosis    Right shoulder pain 06/18/2011   Foot pain, left 06/18/2011   Hyperlipidemia 03/24/2011   Disorder of bone 02/12/2009   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    EvalBETHA Green in June. Have had  2 compression fx.    2 falls in one day early march 1st compression fx then again in June sustaining a second.  Has a membership here at [facility]. I have been Walking in pool 4 x week for 45 mins to 1 hr. But I still hurt. I want to work on my balance and stand up from a chair without using my arms.  Standing up from a chair my right knee and back hurts. Using rollator when walking back to pool but not any other time  PERTINENT HISTORY:  Evaluate and treat for L1 compression fx and right knee pain. Include aquatic therapy in treatment plan.  Chronic fatigue; fibromyalgia  PAIN:  Are you having pain? no: NPRS scale: 5-6/10 Pain location: neck, knees, back  Pain description: fibro vs OA I don't know what  Aggravating factors: transfering STS; in and out of bed; in and out of car Relieving factors: rest  PRECAUTIONS: Fall  RED FLAGS: None   WEIGHT BEARING RESTRICTIONS: No  FALLS:  Has patient fallen in last 6 months? Yes. Number of falls 1 fall backwards after putting my foot on a chair to toe my shoe.  LIVING ENVIRONMENT: Lives with: lives with their spouse Lives in: House/apartment Has following equipment at home: Single point cane, Environmental Consultant - 2 wheeled, Environmental Consultant - 4 wheeled, and Grab bars  OCCUPATION: retired financial controller  PLOF: Independent  PATIENT GOALS: Improve balance, get in and out of chairs, car and bed better  NEXT MD VISIT: as needed  OBJECTIVE:  Note: Objective measures were completed at Evaluation unless otherwise noted.  DIAGNOSTIC FINDINGS:  7/25 CT Lumbar IMPRESSION: 1. Subacute compression deformities at T12 and L1 with loss of height of 10% at each level. Sclerotic change consistent with ongoing healing process. I cannot state by CT if there is complete healing. No retropulsed bone. 2. L3-4: Mild disc bulge and facet osteoarthritis. No  compressive stenosis. 3. L4-5: Mild disc bulge. Bilateral facet degeneration and hypertrophy. 1-2 mm of degenerative anterolisthesis. Mild narrowing of both lateral recesses, not grossly compressive. 4. L5-S1: Endplate osteophytes and bulging of the disc. Bilateral facet osteoarthritis worse on the right. No compressive stenosis.  05/29/23 Xray R knee IMPRESSION: 1. 9.3 x 3.9 x 3.7 cm heterogenous layering hypodense fluid collection most compatible with a hematoma extending along the superficial fascia of the right posterolateral thigh, overlying and compressing the biceps femoris musculature, which could represent a soft tissue degloving injury, such as a Morel-Lavallee lesion. This collection appears to continue below the level of the knee into the lateral gastrocnemius muscle, concerning for intramuscular hematoma. 2. Intact right total knee arthroplasty. No acute osseous abnormality. 3. Small knee joint effusion.  PATIENT SURVEYS:  ODI: 23/50=46%  COGNITION: Overall cognitive status: Within functional limits for tasks assessed     SENSATION: WFL  MUSCLE LENGTH: Hamstrings: wfl   POSTURE: rounded shoulders, forward head, decreased thoracic kyphosis, and left pelvic obliquity  PALPATION: Right patella crepitus  LUMBAR ROM:   AROM eval 12/29/23  Flexion full   Extension    Right lateral flexion 90% limited P! 50% limited P!  Left lateral flexion 90% limited P! 50% limited P!  Right rotation    Left rotation     (Blank rows = not tested)  LOWER EXTREMITY ROM:     Active  Right eval Left eval  Hip flexion    Hip extension    Hip abduction    Hip adduction    Hip internal rotation    Hip external rotation    Knee flexion 110 128  Knee extension 0 0  Ankle dorsiflexion    Ankle plantarflexion    Ankle inversion    Ankle eversion     (Blank rows = not tested)  LOWER EXTREMITY MMT:    MMT Right eval Left eval R / L 12/29/23  Hip flexion 3 3 3+ / 3+   Hip extension     Hip abduction 4+ 4+ 5- / 5-  Hip abd 4- 4- 5 /5  Hip internal rotation     Hip external rotation     Knee flexion 4 5 5   Knee extension 3+ 4 4  Ankle dorsiflexion     Ankle plantarflexion     Ankle inversion     Ankle eversion      (Blank rows = not tested)  LUMBAR SPECIAL TESTS:  Slump test: Negative  FUNCTIONAL TESTS:  30 seconds chair stand test Timed up and go (TUG): 21.24   Item Test date: 11/05/23 Date: 12/29/23 Date:   Sitting to standing 2. able to stand using hands after several tries 3. able to stand independently using hands  4. able to stand safely for 2 minutes  4. able to sit safely and securely for 2 minutes   3. controls descent by using hands  3. able to transfer safely with definite need of hands  4. able to stand 10 seconds safely  3. able to place feet together independently and stand 1 minute with supervision  4. can reach forward confidently 25 cm (10 inches)    2. unable to pick up but reaches 2-5 cm (1-2 inches) from slipper and keeps balance independently  3. looks behind one side only, other side shows less weight shift    1. needs close supervision or verbal cuing  0. needs assistance to keep from falling/unable to try    0. loses balance while stepping or standing   0. unable to try of needs assist to prevent fall     Insert SmartPhrase OPRCBERGREEVAL  2. Standing unsupported 4. able to stand safely for 2 minutes    3. Sitting with back /unsupported, feet supported 4. able to sit safely and securely for 2 minutes    4. Standing to sitting 2. uses back of legs against chair to control descent    5. Pivot transfer  3. able to transfer safely with definite need of hands    6. Standing unsupported with eyes closed 2. able to stand 3 seconds    7. Standing unsupported with feet together 1. needs help to attain position but able to stand 15 seconds feet together    8. Reaching forward with outstretched arms while standing 2.  can reach forward 5 cm (2 inches)    9. Pick up object from the floor from standing 3. able to pick up slipper but needs supervision    10. Turning to look behind over left and right shoulders while standing 3. looks behind one side only, other side shows less weight shift    11. Turn 360 degrees 1. needs close supervision or verbal cuing    12. Place alternate foot on step or stool while standing unsupported 0. needs assistance to keep from falling/unable to try    13. Standing unsupported one foot in front 0. loses balance while  stepping or standing    14. Standing on one leg 0. unable to try of needs assist to prevent fall      Total Score 27/56 Total Score:   34/56 Total Score:     GAIT: Distance walked: 400 ft Assistive device utilized: rollator Level of assistance: Complete Independence Comments: quickened short step length, reduced hip/knee flex, little arm swing  TREATMENT  [facility] Adult PT Treatment:                                              01/15/24 Nustep lvl 5, 7 min Step up onto forward 6 step, bilat  Step up laterally onto 6 bilat  Stepping over hurdles fwd and lateral with focus on big steps Seated high marches SL stance on, bilat LE with finger tip hold, 30 sec hold Tandem stance, bilat with finger tip hold, 30 sec hold 2 laps around clinic with SPC with gait belt to assess gait mechanics.    11/13  There-ex:  Lumbar ball stretch x 5 sec hold  Lateral ball stretch 5x 5 sec hold  Neuro re-ed:  LAQ 2x10 each leg  Hip abduction 2x10 red  Balance:  Narrow base of support EO and EC 2x30 seconds each   Tandem stance EO 2x30 sec hold    Date: 01/05/24 Pt seen for aquatic therapy today.  Treatment took place in water  3.5-4.75 ft in depth at the [facility] pool. Temp of water  was 91.  Pt entered/exited the pool via stairs with bil rail.  -walking forward and back -Stair negotiation alternating steps ue support intermittently on handrails  (iincreasingly as decreased submersion) -step up leading R/L 2 x 5. VC for reduced UE support - tandem gait forward/ backward ->with rainbow hand floats (good balance challenge) - alternating toe taps to 1st step x 10, without UE support->2nd step -L stretch-> hip hiking - wall push up/off for reactive balance x 10 - TrA set with hollow long noodle in standard stance x 10, staggered stance x 8 (improving balance and control good challenge)  - suitcase carry with bil rainbow hand float, marching forward / backward -walking forward and back between exercises fro recovery  01/01/24 STM to glutes, lumbar paraspinals  Standing marches x20 Standing hip abduction x10 Gait training in hallway  Gait training with hurdles and blue airex pad    Date: 12/29/23  PN testing: BERG ; muscle; lumbar ROM   Pt seen for aquatic therapy today.  Treatment took place in water  3.5-4.75 ft in depth at the [facility] pool. Temp of water  was 91.  Pt entered/exited the pool via stairs with bil rail.  -walking forward and backward. Cues for arm swing -STS from bench onto water  step. VC for immediate standing balance -Tandem stance and SLS ue support RBHB then without -stair negotiation ascending and descending using alternating pattern with hand rails for safety.  Pt requires the buoyancy and hydrostatic pressure of water  for support, and to offload joints by unweighting joint load by at least 50 % in navel deep water  and by at least 75-80% in chest to neck deep water .  Viscosity of the water  is needed for resistance of strengthening. Water  current perturbations provides challenge to standing balance requiring increased core activation.   12/24/23: - Nustep L5x8 minutes all four extremities for w/u  - LAQ with 1lb weight  -  Marching with 1b weight on floor for 1 min, on airex for 1 min - 1 lap around clinic with gait belt with cognitive challenge - Step up onto 4 step alternating - Stepping over  hurdles in fwd, lateral direction at bar.   12/18/23  Nustep L5x8 minutes all four extremities for w/u   Tandem stance solid surface 3x30 seconds B  Narrow BOS 4x30 seconds  Tandem walking x3 laps next to table, MinA from PT for balance Lateral weight shifts x60 seconds Narrow BOS blue foam pad 3x30 seconds SLS with one foot on blue yoga block 3x30 seconds B         PATIENT EDUCATION:  Education details: intro to aquatic therapy   Person educated: Patient Education method: Explanation, demonstration Education comprehension: verbalized understanding  HOME EXERCISE PROGRAM: Access Code: [email or web address] URL: [email or web address] Date: 11/09/2023 Prepared by: Dale Call  ASSESSMENT:  CLINICAL IMPRESSION: Pt states that she has been working in and out of the pool. She denies any pain at this moment, but reports increased pain after getting out of the pool. Session with focus on balance and LE strengthening today. Pt demonstrates increased difficulty with L LE abduction, lateral step up's. She requires SBA with balance activities and ambulation exercises. Pt inquired about walking with SPC instead of walker. She demonstrates safe gait with good form. Instructed her to ensure she is not feeling fatigued when utilizing her SPC. Pt will continue to benefit from skilled PT to address continued deficits.   Re-cert/PN: pt progressing well with skilled physical therapy intervention. Functional and objective testing as noted above demonstrate improvements in strength, lumbar ROM and balance reducing pt's fall risk. She does report an additional fall in past month but reports tripping over an object in her path at home (without injury). She verbalizes noticing a difference in her function at home as her strength and balance has improved. She is consistently using Rolator when out of home for added safety. Pt will benefit from continued physical therapy to progress towards meeting all  stated goals.        OBJECTIVE IMPAIRMENTS: Abnormal gait, decreased activity tolerance, decreased balance, decreased knowledge of use of DME, decreased mobility, difficulty walking, decreased ROM, decreased strength, postural dysfunction, obesity, and pain.   ACTIVITY LIMITATIONS: carrying, lifting, bending, sitting, standing, squatting, stairs, and transfers  PARTICIPATION LIMITATIONS: meal prep, cleaning, laundry, driving, shopping, and community activity  PERSONAL FACTORS: Age and 1-2 comorbidities: see PmHx are also affecting patient's functional outcome.   REHAB POTENTIAL: Good  CLINICAL DECISION MAKING: Evolving/moderate complexity  EVALUATION COMPLEXITY: Moderate   GOALS: Goals reviewed with patient? Yes  SHORT TERM GOALS: Target date: 12/14/23 (delay in beginning therapy)  Pt will tolerate full aquatic sessions consistently without increase in pain and with improving function to demonstrate good toleration and effectiveness of intervention.  Baseline: Goal status: Met 12/29/23  2.  Pt will tolerate stair climbing in and out of pool using approp pattern ascending and descending 6 steps with use of handrail  Baseline:  Goal status: Met 12/29/23  3.  Pt will perform 10 STS from water  bench onto water  step unsupported without LOB Baseline:  Goal status:  Met 12/29/23  4.  Pt and cg will consider grab bars for commode  Baseline:  Goal status: deferred as pt reports she is rising off of a raised commode without difficulty 12/29/23  5.  Pt will be indep and compliant with initial land based HEP for improving strength and safety at home Baseline:  Goal status: Met 12/29/23    LONG TERM GOALS: Target date: 03/19/23  Pt to improve on ODI by 13% to demonstrate statistically significant Improvement in function. (MCID 13-15%) Baseline: 23/50=46% Goal status: INITIAL  2.  Pt will improve strength in bil hip flex and right knee ext by 1 grade to demonstrate improved overall  physical function Baseline: see chart Goal status: INITIAL  3.  Pt will improve on Berg balance test to >/= 35/56 to demonstrate a decrease in fall risk. (MCD 7) Baseline:27/56 (medium fall risk) ; 34/56 Goal status: In progress 12/29/23  4.  Pt will improve on 30s STS test  from standard chair from 3 to 6 to demonstrate improving functional lower extremity strength, transitional movements, and balance. (MDC = 4.2sec)  Baseline: 3 Goal status: INITIAL  5.  Pt will report decrease in pain by at least 50% for improved toleration to activity/quality of life and to demonstrate improved management of pain. Baseline: see chart Goal status: INITIAL  6.  Pt will be indep with final HEP's (land and aquatic as appropriate) for continued management of condition Baseline: see chart Goal status: INITIAL  PLAN:  PT FREQUENCY: 2x/week  PT DURATION: 10 w(extended out due to holiday schedule) alternate land/aquatics  PLANNED INTERVENTIONS: 97164- PT Re-evaluation, 97750- Physical Performance Testing, 97110-Therapeutic exercises, 97530- Therapeutic activity, 97112- Neuromuscular re-education, 97535- Self Care, [id number]- Manual therapy, [id number]- Gait training, [phone]- Aquatic Therapy, [phone]- Ionotophoresis 4mg /ml Dexamethasone , 79439 (1-2 muscles), 20561 (3+ muscles)- Dry Needling, Patient/Family education, Balance training, Stair training, Taping, Joint mobilization, DME instructions, Cryotherapy, and Moist heat.  PLAN FOR NEXT SESSION: aquatic: core and le strengthening, transfers, stair climbing, balance retraining  Land:  continue to progress balance and glute/quad strength    Rojean Batten PT, DPT 01/15/24  11:53 AM

## 2024-01-15 ENCOUNTER — Encounter (HOSPITAL_BASED_OUTPATIENT_CLINIC_OR_DEPARTMENT_OTHER): Payer: Self-pay | Admitting: Physical Therapy

## 2024-01-15 ENCOUNTER — Ambulatory Visit (HOSPITAL_BASED_OUTPATIENT_CLINIC_OR_DEPARTMENT_OTHER): Payer: Self-pay | Admitting: Physical Therapy

## 2024-01-15 DIAGNOSIS — M5459 Other low back pain: Secondary | ICD-10-CM

## 2024-01-15 DIAGNOSIS — G8929 Other chronic pain: Secondary | ICD-10-CM

## 2024-01-15 DIAGNOSIS — R2681 Unsteadiness on feet: Secondary | ICD-10-CM

## 2024-01-15 DIAGNOSIS — M6281 Muscle weakness (generalized): Secondary | ICD-10-CM

## 2024-01-20 ENCOUNTER — Encounter (HOSPITAL_BASED_OUTPATIENT_CLINIC_OR_DEPARTMENT_OTHER): Payer: Self-pay | Admitting: Physical Therapy

## 2024-01-20 ENCOUNTER — Ambulatory Visit (HOSPITAL_BASED_OUTPATIENT_CLINIC_OR_DEPARTMENT_OTHER): Admitting: Physical Therapy

## 2024-01-20 DIAGNOSIS — G8929 Other chronic pain: Secondary | ICD-10-CM

## 2024-01-20 DIAGNOSIS — M5459 Other low back pain: Secondary | ICD-10-CM

## 2024-01-20 DIAGNOSIS — M6281 Muscle weakness (generalized): Secondary | ICD-10-CM

## 2024-01-20 DIAGNOSIS — R2681 Unsteadiness on feet: Secondary | ICD-10-CM

## 2024-01-20 NOTE — Therapy (Signed)
 OUTPATIENT PHYSICAL THERAPY THORACOLUMBAR TREATMENT    Patient Name: [name] MRN: [id number] DOB:19-Sep-1940, 83 y.o., female Today's Date: 01/20/2024  END OF SESSION:  PT End of Session - 01/20/24 0807     Visit Number 15    Number of Visits 16    Date for Recertification  03/18/24    Authorization Type aetna Mcr    Progress Note Due on Visit 20    PT Start Time 0720    PT Stop Time 0800    PT Time Calculation (min) 40 min    Equipment Utilized During Treatment Gait belt    Activity Tolerance Patient tolerated treatment well    Behavior During Therapy WFL for tasks assessed/performed              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   Hyperlipidemia    Impaired hearing    left ear, wears hearing aids   Insomnia    circadian rhythm component   Insomnia    Migraine 11/1986   Spillman   Nuclear sclerosis    OSA on CPAP 03/2005   uses cpap setting of 10   Osteoarthritis 2007   Deveschwar   Pain last week   left under breast pain    Plantar fasciitis    Rheumatic fever    Scarlet fever as child   Sleep apnea    Swallowing difficulty    Thyroid  disorder    Vitamin D  deficiency    Vitreous degeneration of right eye    Past Surgical History:  Procedure Laterality Date   BREAST BIOPSY Left 6/00   [place]   BREAST EXCISIONAL BIOPSY Left 1998   DE QUERVAIN'S RELEASE Right 10/97   Sypher   left breast biopsy     ROOT CANAL  02/11/2012   TONSILLECTOMY  age 11   TONSILLECTOMY AND ADENOIDECTOMY  1952   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   TOTAL KNEE ARTHROPLASTY Right 11/08/2012   Procedure: RIGHT TOTAL KNEE ARTHROPLASTY;  Surgeon: Dempsey LULLA Moan, MD;  Location: [facility];  Service: Orthopedics;  Laterality:  Right;   TUBAL LIGATION     Patient Active Problem List   Diagnosis Date Noted   Venous stasis of lower extremity 07/28/2023   Great toe pain, right 06/16/2023   Acute pain of right knee 05/28/2023   Closed compression fracture of body of first lumbar vertebra (HCC) 05/28/2023   Bilateral lower extremity edema 02/03/2022   Fluid retention 12/17/2021   Low back pain 10/03/2021   Atherosclerotic heart disease of native coronary artery without angina pectoris 05/27/2021   Diastolic dysfunction 05/27/2021   Neck pain 05/27/2021   Migraine 04/23/2021   Generalized obesity 04/23/2021   Depression 03/18/2021   Lumbar spine pain 08/30/2020   Flatulence, eructation and gas pain 05/07/2020   Change in bowel habit 05/07/2020   Gout 06/22/2019   Post viral syndrome 06/15/2019   Cervicalgia 06/15/2019   Chronic tension-type headache, intractable 06/15/2019   Fall 04/21/2018   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   Diverticulosis 12/23/2017   History of adenomatous polyp of colon 12/23/2017   Recurrent falls 12/14/2017   Degenerative disc disease, cervical 12/14/2017  Chronic constipation 12/14/2017   Floaters in visual field, bilateral 08/10/2017   Head injury consultation 08/10/2017   Arthritis of carpometacarpal [facility]) joint of right thumb 05/14/2017   Therapeutic opioid-induced constipation (OIC) 02/05/2017   BMI 29.0-29.9,adult 02/05/2017   Dry mouth 02/05/2017   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/08/2017   Intolerance of continuous positive airway pressure (CPAP) ventilation 01/08/2017   Benign neoplasm of colon 11/11/2016   Other long term (current) drug therapy 11/11/2016   History of total knee arthroplasty, 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   Cough 03/06/2016   At risk for injury related to fall 02/14/2016   Primary osteoarthritis of both hands 02/04/2016   Osteopenia of multiple sites 02/04/2016   Metatarsalgia of both feet 02/04/2016   Migraines 02/04/2016   Other  fatigue 02/01/2016   Melena 08/20/2015   Acute recurrent maxillary sinusitis 08/20/2015   Slow transit constipation 08/20/2015   Vitamin D  deficiency 08/20/2015   Thyroid  activity decreased 08/20/2015   Cephalalgia 08/20/2015   Preop cardiovascular exam 07/20/2015   Subacute ethmoidal sinusitis 12/11/2014   Chronic infection of sinus 07/12/2014   Nasal septal ulcer 05/25/2014   DJD (degenerative joint disease), cervical 03/22/2014   Right hand pain 03/22/2014   Deflected nasal septum 01/30/2014   Hypertrophy of nasal turbinates 01/30/2014   UARS (upper airway resistance syndrome) 10/04/2013   Insomnia secondary to depression with anxiety 10/04/2013   Exertional dyspnea 08/20/2013   History of rheumatic fever 08/20/2013   Hypomania (mild) single episode or unspecified 08/09/2013   OSA on CPAP 04/06/2013   Postoperative anemia due to acute blood loss 11/09/2012   OA (osteoarthritis) of knee 11/08/2012   History of giardia infection 07/15/2012   Atrophic vaginitis 07/15/2012   Vitreous degeneration of right eye    Nuclear sclerosis    Right shoulder pain 06/18/2011   Foot pain, left 06/18/2011   Hyperlipidemia 03/24/2011   Disorder of bone 02/12/2009   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  Pain in my neck and top of shoulder   Eval:  [name] in June. Have had  2 compression fx.    2 falls in one day early march 1st compression fx then again in June sustaining a second.  Has a membership here at [facility]. I have been Walking in pool 4 x week for 45 mins to 1 hr. But I still hurt. I want to work on my balance and stand up from a chair without using my arms.  Standing up from a chair my right knee and back hurts. Using rollator when walking back to pool but not any other time  PERTINENT HISTORY:  Evaluate and treat for L1 compression fx and right knee pain. Include aquatic therapy in treatment plan.  Chronic fatigue; fibromyalgia  PAIN:  Are you having pain? no: NPRS scale: 5-6/10 Pain location: neck, knees, back  Pain description: fibro vs OA I don't know what  Aggravating factors: transfering STS; in and out of bed; in and out of car Relieving factors: rest  PRECAUTIONS: Fall  RED FLAGS: None   WEIGHT BEARING RESTRICTIONS: No  FALLS:  Has patient fallen in last 6 months? Yes. Number of falls 1 fall backwards after putting my foot on a chair to toe my shoe.  LIVING ENVIRONMENT: Lives with: lives with their spouse Lives in: House/apartment Has following equipment at home: Single point cane, Environmental Consultant - 2 wheeled, Environmental Consultant - 4 wheeled, and Grab bars  OCCUPATION: retired financial controller  PLOF: Independent  PATIENT GOALS: Improve balance, get in and out of chairs, car and bed better  NEXT MD VISIT: as needed  OBJECTIVE:  Note: Objective measures were completed at Evaluation unless otherwise noted.  DIAGNOSTIC FINDINGS:  7/25 CT Lumbar IMPRESSION: 1. Subacute compression deformities at T12 and L1 with loss of height of 10% at each level. Sclerotic change consistent with ongoing healing process. I cannot state by CT if there is complete healing. No retropulsed bone. 2. L3-4: Mild disc bulge and facet osteoarthritis.  No compressive stenosis. 3. L4-5: Mild disc bulge. Bilateral facet degeneration and hypertrophy. 1-2 mm of degenerative anterolisthesis. Mild narrowing of both lateral recesses, not grossly compressive. 4. L5-S1: Endplate osteophytes and bulging of the disc. Bilateral facet osteoarthritis worse on the right. No compressive stenosis.  05/29/23 Xray R knee IMPRESSION: 1. 9.3 x 3.9 x 3.7 cm heterogenous layering hypodense fluid collection most compatible with a hematoma extending along the superficial fascia of the right posterolateral thigh, overlying and compressing the biceps femoris musculature, which could represent a soft tissue degloving injury, such as a Morel-Lavallee lesion. This collection appears to continue below the level of the knee into the lateral gastrocnemius muscle, concerning for intramuscular hematoma. 2. Intact right total knee arthroplasty. No acute osseous abnormality. 3. Small knee joint effusion.  PATIENT SURVEYS:  ODI: 23/50=46%  COGNITION: Overall cognitive status: Within functional limits for tasks assessed     SENSATION: WFL  MUSCLE LENGTH: Hamstrings: wfl   POSTURE: rounded shoulders, forward head, decreased thoracic kyphosis, and left pelvic obliquity  PALPATION: Right patella crepitus  LUMBAR ROM:   AROM eval 12/29/23  Flexion full   Extension    Right lateral flexion 90% limited P! 50% limited P!  Left lateral flexion 90% limited P! 50% limited P!  Right rotation    Left rotation     (Blank rows = not tested)  LOWER EXTREMITY ROM:     Active  Right eval Left eval  Hip flexion    Hip extension    Hip abduction    Hip adduction    Hip internal rotation    Hip external rotation    Knee flexion 110 128  Knee extension 0 0  Ankle dorsiflexion    Ankle plantarflexion    Ankle inversion    Ankle eversion     (Blank rows = not tested)  LOWER EXTREMITY MMT:    MMT Right eval Left eval R / L 12/29/23  Hip flexion 3 3 3+ /  3+  Hip extension     Hip abduction 4+ 4+ 5- / 5-  Hip abd 4- 4- 5 /5  Hip internal rotation     Hip external rotation     Knee flexion 4 5 5   Knee extension 3+ 4 4  Ankle dorsiflexion     Ankle plantarflexion     Ankle inversion     Ankle eversion      (Blank rows = not tested)  LUMBAR SPECIAL TESTS:  Slump test: Negative  FUNCTIONAL TESTS:  30 seconds chair stand test Timed up and go (TUG): 21.24   Item Test date: 11/05/23 Date: 12/29/23 Date:   Sitting to standing 2. able to stand using hands after several tries 3. able to stand independently using hands  4. able to stand safely for 2 minutes  4. able to sit safely and securely for 2 minutes   3. controls descent by using hands  3. able to transfer safely with definite need of hands  4. able to stand 10 seconds safely  3. able to place feet together independently and stand 1 minute with supervision  4. can reach forward confidently 25 cm (10 inches)    2. unable to pick up but reaches 2-5 cm (1-2 inches) from slipper and keeps balance independently  3. looks behind one side only, other side shows less weight shift    1. needs close supervision or verbal cuing  0. needs assistance to keep from falling/unable to try    0. loses balance while stepping or standing   0. unable to try of needs assist to prevent fall     Insert SmartPhrase OPRCBERGREEVAL  2. Standing unsupported 4. able to stand safely for 2 minutes    3. Sitting with back /unsupported, feet supported 4. able to sit safely and securely for 2 minutes    4. Standing to sitting 2. uses back of legs against chair to control descent    5. Pivot transfer  3. able to transfer safely with definite need of hands    6. Standing unsupported with eyes closed 2. able to stand 3 seconds    7. Standing unsupported with feet together 1. needs help to attain position but able to stand 15 seconds feet together    8. Reaching forward with outstretched arms while standing 2.  can reach forward 5 cm (2 inches)    9. Pick up object from the floor from standing 3. able to pick up slipper but needs supervision    10. Turning to look behind over left and right shoulders while standing 3. looks behind one side only, other side shows less weight shift    11. Turn 360 degrees 1. needs close supervision or verbal cuing    12. Place alternate foot on step or stool while standing unsupported 0. needs assistance to keep from falling/unable to try    13. Standing unsupported one foot in front 0. loses balance while  stepping or standing    14. Standing on one leg 0. unable to try of needs assist to prevent fall      Total Score 27/56 Total Score:   34/56 Total Score:     GAIT: Distance walked: 400 ft Assistive device utilized: rollator Level of assistance: Complete Independence Comments: quickened short step length, reduced hip/knee flex, little arm swing  TREATMENT  [facility] Adult PT Treatment:                                              01/20/24 Pt seen for aquatic therapy today.  Treatment took place in water  3.5-4.75 ft in depth at the [facility] pool. Temp of water  was 91.  Pt entered/exited the pool via stairs with bil rail.  -walking forward and back -wide stance ue horizontal add/abd -[place] (difficulty coordinating). Focused on weight shift back/forth. -alternating ue row wide stance -open book R/L x 5 - wall push up/off for reactive balance x 10.  Cues for stronger push to activate balance reaction in ankles -weight shift forward->back; back->forward x 5 ue support yellow hb -ue support wall stepping over opposite foot R/L->grapevine. Ue support HB along the length of the wall for extra support -walking forward and back between exercises fro recovery  01/15/24 Nustep lvl 5, 7 min Step up onto forward 6 step, bilat  Step up laterally onto 6 bilat  Stepping over hurdles fwd and lateral with focus on big steps Seated high marches SL stance  on, bilat LE with finger tip hold, 30 sec hold Tandem stance, bilat with finger tip hold, 30 sec hold 2 laps around clinic with SPC with gait belt to assess gait mechanics.    11/13  There-ex:  Lumbar ball stretch x 5 sec hold  Lateral ball stretch 5x 5 sec hold  Neuro re-ed:  LAQ 2x10 each leg  Hip abduction 2x10 red  Balance:  Narrow base of support EO and EC 2x30 seconds each   Tandem stance EO 2x30 sec hold    Date: 01/05/24 Pt seen for aquatic therapy today.  Treatment took place in water  3.5-4.75 ft in depth at the [facility] pool. Temp of water  was 91.  Pt entered/exited the pool via stairs with bil rail.  -walking forward and back -Stair negotiation alternating steps ue support intermittently on handrails (iincreasingly as decreased submersion) -step up leading R/L 2 x 5. VC for reduced UE support - tandem gait forward/ backward ->with rainbow hand floats (good balance challenge) - alternating toe taps to 1st step x 10, without UE support->2nd step -L stretch-> hip hiking - wall push up/off for reactive balance x 10 - TrA set with hollow long noodle in standard stance x 10, staggered stance x 8 (improving balance and control good challenge)  - suitcase carry with bil rainbow hand float, marching forward / backward -walking forward and back between exercises fro recovery  01/01/24 STM to glutes, lumbar paraspinals  Standing marches x20 Standing hip abduction x10 Gait training in hallway  Gait training with hurdles and blue airex pad    Date: 12/29/23  PN testing: BERG ; muscle; lumbar ROM   Pt seen for aquatic therapy today.  Treatment took place in water  3.5-4.75 ft in depth at the [facility] pool. Temp of water  was 91.  Pt entered/exited the pool via stairs with bil  rail.  -walking forward and backward. Cues for arm swing -STS from bench onto water  step. VC for immediate standing balance -Tandem stance and SLS ue support RBHB then  without -stair negotiation ascending and descending using alternating pattern with hand rails for safety.  Pt requires the buoyancy and hydrostatic pressure of water  for support, and to offload joints by unweighting joint load by at least 50 % in navel deep water  and by at least 75-80% in chest to neck deep water .  Viscosity of the water  is needed for resistance of strengthening. Water  current perturbations provides challenge to standing balance requiring increased core activation.   12/24/23: - Nustep L5x8 minutes all four extremities for w/u  - LAQ with 1lb weight  - Marching with 1b weight on floor for 1 min, on airex for 1 min - 1 lap around clinic with gait belt with cognitive challenge - Step up onto 4 step alternating - Stepping over hurdles in fwd, lateral direction at bar.   12/18/23  Nustep L5x8 minutes all four extremities for w/u   Tandem stance solid surface 3x30 seconds B  Narrow BOS 4x30 seconds  Tandem walking x3 laps next to table, MinA from PT for balance Lateral weight shifts x60 seconds Narrow BOS blue foam pad 3x30 seconds SLS with one foot on blue yoga block 3x30 seconds B         PATIENT EDUCATION:  Education details: intro to aquatic therapy   Person educated: Patient Education method: Explanation, demonstration Education comprehension: verbalized understanding  HOME EXERCISE PROGRAM: Access Code: [email or web address] URL: [email or web address] Date: 11/09/2023 Prepared by: Dale Call  ASSESSMENT:  CLINICAL IMPRESSION: Progressed balance challenges today. She is particularly challenged with weight shifting backward. Worked on reactive balance with good toleration. Some apprehension demonstrated as  expected but it does improve with repetition. Pt encouraged /reminded due to the properties of water  she does not need to fear falling.  She is progressing well. Reports she feels her balance is improving functionally.  Pt will continue to benefit  from skilled aquatic and land PT to address continued deficits.    Re-cert/PN: pt progressing well with skilled physical therapy intervention. Functional and objective testing as noted above demonstrate improvements in strength, lumbar ROM and balance reducing pt's fall risk. She does report an additional fall in past month but reports tripping over an object in her path at home (without injury). She verbalizes noticing a difference in her function at home as her strength and balance has improved. She is consistently using Rolator when out of home for added safety. Pt will benefit from continued physical therapy to progress towards meeting all stated goals.        OBJECTIVE IMPAIRMENTS: Abnormal gait, decreased activity tolerance, decreased balance, decreased knowledge of use of DME, decreased mobility, difficulty walking, decreased ROM, decreased strength, postural dysfunction, obesity, and pain.   ACTIVITY LIMITATIONS: carrying, lifting, bending, sitting, standing, squatting, stairs, and transfers  PARTICIPATION LIMITATIONS: meal prep, cleaning, laundry, driving, shopping, and community activity  PERSONAL FACTORS: Age and 1-2 comorbidities: see PmHx are also affecting patient's functional outcome.   REHAB POTENTIAL: Good  CLINICAL DECISION MAKING: Evolving/moderate complexity  EVALUATION COMPLEXITY: Moderate   GOALS: Goals reviewed with patient? Yes  SHORT TERM GOALS: Target date: 12/14/23 (delay in beginning therapy)  Pt will tolerate full aquatic sessions consistently without increase in pain and with improving function to demonstrate good toleration and effectiveness of intervention.  Baseline: Goal status: Met 12/29/23  2.  Pt will tolerate stair  climbing in and out of pool using approp pattern ascending and descending 6 steps with use of handrail  Baseline:  Goal status: Met 12/29/23  3.  Pt will perform 10 STS from water  bench onto water  step unsupported without  LOB Baseline:  Goal status:  Met 12/29/23  4.  Pt and cg will consider grab bars for commode  Baseline:  Goal status: deferred as pt reports she is rising off of a raised commode without difficulty 12/29/23  5.  Pt will be indep and compliant with initial land based HEP for improving strength and safety at home Baseline:  Goal status: Met 12/29/23    LONG TERM GOALS: Target date: 03/19/23  Pt to improve on ODI by 13% to demonstrate statistically significant Improvement in function. (MCID 13-15%) Baseline: 23/50=46% Goal status: INITIAL  2.  Pt will improve strength in bil hip flex and right knee ext by 1 grade to demonstrate improved overall physical function Baseline: see chart Goal status: INITIAL  3.  Pt will improve on Berg balance test to >/= 35/56 to demonstrate a decrease in fall risk. (MCD 7) Baseline:27/56 (medium fall risk) ; 34/56 Goal status: In progress 12/29/23  4.  Pt will improve on 30s STS test  from standard chair from 3 to 6 to demonstrate improving functional lower extremity strength, transitional movements, and balance. (MDC = 4.2sec)  Baseline: 3 Goal status: INITIAL  5.  Pt will report decrease in pain by at least 50% for improved toleration to activity/quality of life and to demonstrate improved management of pain. Baseline: see chart Goal status: INITIAL  6.  Pt will be indep with final HEP's (land and aquatic as appropriate) for continued management of condition Baseline: see chart Goal status: INITIAL  PLAN:  PT FREQUENCY: 2x/week  PT DURATION: 10 w(extended out due to holiday schedule) alternate land/aquatics  PLANNED INTERVENTIONS: 97164- PT Re-evaluation, 97750- Physical Performance Testing, 97110-Therapeutic exercises, 97530- Therapeutic activity, 97112- Neuromuscular re-education, 97535- Self Care, [id number]- Manual therapy, [id number]- Gait training, [phone]- Aquatic Therapy, [phone]- Ionotophoresis 4mg /ml Dexamethasone , 79439 (1-2 muscles), 20561 (3+  muscles)- Dry Needling, Patient/Family education, Balance training, Stair training, Taping, Joint mobilization, DME instructions, Cryotherapy, and Moist heat.  PLAN FOR NEXT SESSION: aquatic: core and le strengthening, transfers, stair climbing, balance retraining  Land:  continue to progress balance and glute/quad strength    Ronal Kem) Ebonye Reade MPT 01/20/24 8:11 AM [facility] [address] [place], [place], [address] Phone: [phone]   Fax:  [phone]

## 2024-01-25 ENCOUNTER — Encounter (HOSPITAL_BASED_OUTPATIENT_CLINIC_OR_DEPARTMENT_OTHER): Payer: Self-pay | Admitting: Physical Therapy

## 2024-01-25 ENCOUNTER — Ambulatory Visit (HOSPITAL_BASED_OUTPATIENT_CLINIC_OR_DEPARTMENT_OTHER): Payer: Self-pay | Admitting: Physical Therapy

## 2024-01-25 DIAGNOSIS — M25561 Pain in right knee: Secondary | ICD-10-CM | POA: Diagnosis present

## 2024-01-25 DIAGNOSIS — R2681 Unsteadiness on feet: Secondary | ICD-10-CM | POA: Diagnosis present

## 2024-01-25 DIAGNOSIS — M6281 Muscle weakness (generalized): Secondary | ICD-10-CM | POA: Insufficient documentation

## 2024-01-25 DIAGNOSIS — G8929 Other chronic pain: Secondary | ICD-10-CM | POA: Insufficient documentation

## 2024-01-25 DIAGNOSIS — M5459 Other low back pain: Secondary | ICD-10-CM | POA: Insufficient documentation

## 2024-01-25 NOTE — Therapy (Signed)
 OUTPATIENT PHYSICAL THERAPY THORACOLUMBAR TREATMENT    Patient Name: [name] MRN: [id number] DOB:08/21/1940, 83 y.o., female Today's Date: 01/25/2024  END OF SESSION:  PT End of Session - 01/25/24 1452     Visit Number 16    Date for Recertification  03/18/24    Authorization Type aetna Mcr    Progress Note Due on Visit 30    PT Start Time 1428    PT Stop Time 1512    PT Time Calculation (min) 44 min    Activity Tolerance Patient tolerated treatment well    Behavior During Therapy 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   Hyperlipidemia    Impaired hearing    left ear, wears hearing aids   Insomnia    circadian rhythm component   Insomnia    Migraine 11/1986   Spillman   Nuclear sclerosis    OSA on CPAP 03/2005   uses cpap setting of 10   Osteoarthritis 2007   Deveschwar   Pain last week   left under breast pain    Plantar fasciitis    Rheumatic fever    Scarlet fever as child   Sleep apnea    Swallowing difficulty    Thyroid  disorder    Vitamin D  deficiency    Vitreous degeneration of right eye    Past Surgical History:  Procedure Laterality Date   BREAST BIOPSY Left 6/00   [place]   BREAST EXCISIONAL BIOPSY Left 1998   DE QUERVAIN'S RELEASE Right 10/97   Sypher   left breast biopsy     ROOT CANAL  02/11/2012   TONSILLECTOMY  age 2   TONSILLECTOMY AND ADENOIDECTOMY  1952   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   TOTAL KNEE ARTHROPLASTY Right 11/08/2012   Procedure: RIGHT TOTAL KNEE ARTHROPLASTY;  Surgeon: Dempsey LULLA Moan, MD;  Location: [facility];  Service: Orthopedics;  Laterality: Right;   TUBAL LIGATION     Patient Active Problem List   Diagnosis  Date Noted   Venous stasis of lower extremity 07/28/2023   Great toe pain, right 06/16/2023   Acute pain of right knee 05/28/2023   Closed compression fracture of body of first lumbar vertebra (HCC) 05/28/2023   Bilateral lower extremity edema 02/03/2022   Fluid retention 12/17/2021   Low back pain 10/03/2021   Atherosclerotic heart disease of native coronary artery without angina pectoris 05/27/2021   Diastolic dysfunction 05/27/2021   Neck pain 05/27/2021   Migraine 04/23/2021   Generalized obesity 04/23/2021   Depression 03/18/2021   Lumbar spine pain 08/30/2020   Flatulence, eructation and gas pain 05/07/2020   Change in bowel habit 05/07/2020   Gout 06/22/2019   Post viral syndrome 06/15/2019   Cervicalgia 06/15/2019   Chronic tension-type headache, intractable 06/15/2019   Fall 04/21/2018   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   Diverticulosis 12/23/2017   History of adenomatous polyp of colon 12/23/2017   Recurrent falls 12/14/2017   Degenerative disc disease, cervical 12/14/2017   Chronic constipation 12/14/2017   Floaters in visual field, bilateral 08/10/2017   Head injury  consultation 08/10/2017   Arthritis of carpometacarpal [facility]) joint of right thumb 05/14/2017   Therapeutic opioid-induced constipation (OIC) 02/05/2017   BMI 29.0-29.9,adult 02/05/2017   Dry mouth 02/05/2017   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/08/2017   Intolerance of continuous positive airway pressure (CPAP) ventilation 01/08/2017   Benign neoplasm of colon 11/11/2016   Other long term (current) drug therapy 11/11/2016   History of total knee arthroplasty, 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   Cough 03/06/2016   At risk for injury related to fall 02/14/2016   Primary osteoarthritis of both hands 02/04/2016   Osteopenia of multiple sites 02/04/2016   Metatarsalgia of both feet 02/04/2016   Migraines 02/04/2016   Other fatigue 02/01/2016   Melena 08/20/2015   Acute recurrent maxillary sinusitis  08/20/2015   Slow transit constipation 08/20/2015   Vitamin D  deficiency 08/20/2015   Thyroid  activity decreased 08/20/2015   Cephalalgia 08/20/2015   Preop cardiovascular exam 07/20/2015   Subacute ethmoidal sinusitis 12/11/2014   Chronic infection of sinus 07/12/2014   Nasal septal ulcer 05/25/2014   DJD (degenerative joint disease), cervical 03/22/2014   Right hand pain 03/22/2014   Deflected nasal septum 01/30/2014   Hypertrophy of nasal turbinates 01/30/2014   UARS (upper airway resistance syndrome) 10/04/2013   Insomnia secondary to depression with anxiety 10/04/2013   Exertional dyspnea 08/20/2013   History of rheumatic fever 08/20/2013   Hypomania (mild) single episode or unspecified 08/09/2013   OSA on CPAP 04/06/2013   Postoperative anemia due to acute blood loss 11/09/2012   OA (osteoarthritis) of knee 11/08/2012   History of giardia infection 07/15/2012   Atrophic vaginitis 07/15/2012   Vitreous degeneration of right eye    Nuclear sclerosis    Right shoulder pain 06/18/2011   Foot pain, left 06/18/2011   Hyperlipidemia 03/24/2011   Disorder of bone 02/12/2009   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   Migraine, unspecified, not intractable, without status migrainosus 02/12/2009    PCP: Garnette Ore MD  REFERRING PROVIDER: Helene Haddock MD  REFERRING DIAG:  [phone] (ICD-10-CM) - Acute pain of right knee  S32.010A (ICD-10-CM) - Closed compression fracture of body of L1 vertebra (HCC)    Rationale for Evaluation and Treatment: Rehabilitation  THERAPY DIAG:  Other low back pain  Muscle weakness (generalized)  Unsteadiness on feet  Chronic pain of right knee  ONSET DATE: March 2025  SUBJECTIVE:                                                                                                                                                                                           SUBJECTIVE STATEMENT:  I'm pleased, I feel  more balanced when I'm up and  walking which is a blessing. Its easier to get up out of chairs, still hard but I can do it now. No falls since last time.      EvalBETHA Green in June. Have had  2 compression fx.    2 falls in one day early march 1st compression fx then again in June sustaining a second.  Has a membership here at [facility]. I have been Walking in pool 4 x week for 45 mins to 1 hr. But I still hurt. I want to work on my balance and stand up from a chair without using my arms.  Standing up from a chair my right knee and back hurts. Using rollator when walking back to pool but not any other time  PERTINENT HISTORY:  Evaluate and treat for L1 compression fx and right knee pain. Include aquatic therapy in treatment plan.  Chronic fatigue; fibromyalgia  PAIN:  Are you having pain? yes: NPRS scale:6-7 /10 Pain location: R low back, L base of skull  Pain description: had it since I woke up this morning  Aggravating factors: not sure  Relieving factors: rest. Voltaren  and heat   PRECAUTIONS: Fall  RED FLAGS: None   WEIGHT BEARING RESTRICTIONS: No  FALLS:  Has patient fallen in last 6 months? Yes. Number of falls 1 fall backwards after putting my foot on a chair to toe my shoe.  LIVING ENVIRONMENT: Lives with: lives with their spouse Lives in: House/apartment Has following equipment at home: Single point cane, Environmental Consultant - 2 wheeled, Environmental Consultant - 4 wheeled, and Grab bars  OCCUPATION: retired financial controller  PLOF: Independent  PATIENT GOALS: Improve balance, get in and out of chairs, car and bed better  NEXT MD VISIT: as needed  OBJECTIVE:  Note: Objective measures were completed at Evaluation unless otherwise noted.  DIAGNOSTIC FINDINGS:  7/25 CT Lumbar IMPRESSION: 1. Subacute compression deformities at T12 and L1 with loss of height of 10% at each level. Sclerotic change consistent with ongoing healing process. I cannot state by CT if there is complete healing. No retropulsed bone. 2. L3-4: Mild  disc bulge and facet osteoarthritis. No compressive stenosis. 3. L4-5: Mild disc bulge. Bilateral facet degeneration and hypertrophy. 1-2 mm of degenerative anterolisthesis. Mild narrowing of both lateral recesses, not grossly compressive. 4. L5-S1: Endplate osteophytes and bulging of the disc. Bilateral facet osteoarthritis worse on the right. No compressive stenosis.  05/29/23 Xray R knee IMPRESSION: 1. 9.3 x 3.9 x 3.7 cm heterogenous layering hypodense fluid collection most compatible with a hematoma extending along the superficial fascia of the right posterolateral thigh, overlying and compressing the biceps femoris musculature, which could represent a soft tissue degloving injury, such as a Morel-Lavallee lesion. This collection appears to continue below the level of the knee into the lateral gastrocnemius muscle, concerning for intramuscular hematoma. 2. Intact right total knee arthroplasty. No acute osseous abnormality. 3. Small knee joint effusion.  PATIENT SURVEYS:  ODI: 23/50=46%  COGNITION: Overall cognitive status: Within functional limits for tasks assessed     SENSATION: WFL  MUSCLE LENGTH: Hamstrings: wfl   POSTURE: rounded shoulders, forward head, decreased thoracic kyphosis, and left pelvic obliquity  PALPATION: Right patella crepitus  LUMBAR ROM:   AROM eval 12/29/23  Flexion full   Extension    Right lateral flexion 90% limited P! 50% limited P!  Left lateral flexion 90% limited P! 50% limited P!  Right rotation    Left rotation     (  Blank rows = not tested)  LOWER EXTREMITY ROM:     Active  Right eval Left eval  Hip flexion    Hip extension    Hip abduction    Hip adduction    Hip internal rotation    Hip external rotation    Knee flexion 110 128  Knee extension 0 0  Ankle dorsiflexion    Ankle plantarflexion    Ankle inversion    Ankle eversion     (Blank rows = not tested)  LOWER EXTREMITY MMT:    MMT Right eval Left eval R  / L 12/29/23 R 01/25/24 L 01/25/24  Hip flexion 3 3 3+ / 3+ 3 3+  Hip extension       Hip abduction 4+ 4+ 5- / 5- 4- sidelying  4+ sidelying   Hip abd 4- 4- 5 /5    Hip internal rotation       Hip external rotation       Knee flexion 4 5 5 5 5   Knee extension 3+ 4 4 4+ 4+  Ankle dorsiflexion       Ankle plantarflexion       Ankle inversion       Ankle eversion        (Blank rows = not tested)  LUMBAR SPECIAL TESTS:  Slump test: Negative  FUNCTIONAL TESTS:  30 seconds chair stand test Timed up and go (TUG): 21.24   Item Test date: 11/05/23 Date: 12/29/23 Date: 01/25/24  Sitting to standing 2. able to stand using hands after several tries 3. able to stand independently using hands  4. able to stand safely for 2 minutes  4. able to sit safely and securely for 2 minutes   3. controls descent by using hands  3. able to transfer safely with definite need of hands  4. able to stand 10 seconds safely  3. able to place feet together independently and stand 1 minute with supervision  4. can reach forward confidently 25 cm (10 inches)    2. unable to pick up but reaches 2-5 cm (1-2 inches) from slipper and keeps balance independently  3. looks behind one side only, other side shows less weight shift    1. needs close supervision or verbal cuing  0. needs assistance to keep from falling/unable to try    0. loses balance while stepping or standing   0. unable to try of needs assist to prevent fall     Insert SmartPhrase OPRCBERGREEVAL  2. Standing unsupported 4. able to stand safely for 2 minutes    3. Sitting with back /unsupported, feet supported 4. able to sit safely and securely for 2 minutes    4. Standing to sitting 2. uses back of legs against chair to control descent    5. Pivot transfer  3. able to transfer safely with definite need of hands    6. Standing unsupported with eyes closed 2. able to stand 3 seconds    7. Standing unsupported with feet together 1. needs help  to attain position but able to stand 15 seconds feet together    8. Reaching forward with outstretched arms while standing 2. can reach forward 5 cm (2 inches)    9. Pick up object from the floor from standing 3. able to pick up slipper but needs supervision    10. Turning to look behind over left and right shoulders while standing 3. looks behind one side only, other side shows less weight  shift    11. Turn 360 degrees 1. needs close supervision or verbal cuing    12. Place alternate foot on step or stool while standing unsupported 0. needs assistance to keep from falling/unable to try    13. Standing unsupported one foot in front 0. loses balance while stepping or standing    14. Standing on one leg 0. unable to try of needs assist to prevent fall      Total Score 27/56 Total Score:   34/56 Total Score: 43/56       01/25/24 0001  Standardized Balance Assessment  Standardized Balance Assessment Berg Balance Test  Berg Balance Test  Sit to Stand 4  Standing Unsupported 4  Sitting with Back Unsupported but Feet Supported on Floor or Stool 4  Stand to Sit 4  Transfers 4  Standing Unsupported with Eyes Closed 3  Standing Unsupported with Feet Together 3  From Standing, Reach Forward with Outstretched Arm 3  From Standing Position, Pick up Object from Floor 3  From Standing Position, Turn to Look Behind Over each Shoulder 3  Turn 360 Degrees 2  Standing Unsupported, Alternately Place Feet on Step/Stool 3  Standing Unsupported, One Foot in Front 0  Standing on One Leg 3  Total Score 43        GAIT: Distance walked: 400 ft Assistive device utilized: rollator Level of assistance: Complete Independence Comments: quickened short step length, reduced hip/knee flex, little arm swing  TREATMENT  [facility] Adult PT Treatment:                                               01/25/24  Nustep L6x8 minutes seat 10 all four extremities  MMT, Berg, goals, education on progress with  PT  Tandem stance blue foam pad 6x30 seconds alternating Standing marches/tapping top of 6 inch box blue foam pad x20  Forward and lateral steps over hurdles at bar x2 laps Lateral steps over hurdles x1 lap at bar, got light headed- BP 133/72 HR 76, dizziness resolved before we checked SpO2     01/20/24 Pt seen for aquatic therapy today.  Treatment took place in water  3.5-4.75 ft in depth at the [facility] pool. Temp of water  was 91.  Pt entered/exited the pool via stairs with bil rail.  -walking forward and back -wide stance ue horizontal add/abd -[place] (difficulty coordinating). Focused on weight shift back/forth. -alternating ue row wide stance -open book R/L x 5 - wall push up/off for reactive balance x 10.  Cues for stronger push to activate balance reaction in ankles -weight shift forward->back; back->forward x 5 ue support yellow hb -ue support wall stepping over opposite foot R/L->grapevine. Ue support HB along the length of the wall for extra support -walking forward and back between exercises fro recovery  01/15/24 Nustep lvl 5, 7 min Step up onto forward 6 step, bilat  Step up laterally onto 6 bilat  Stepping over hurdles fwd and lateral with focus on big steps Seated high marches SL stance on, bilat LE with finger tip hold, 30 sec hold Tandem stance, bilat with finger tip hold, 30 sec hold 2 laps around clinic with SPC with gait belt to assess gait mechanics.    11/13  There-ex:  Lumbar ball stretch x 5 sec hold  Lateral ball stretch 5x 5 sec hold  Neuro  re-ed:  LAQ 2x10 each leg  Hip abduction 2x10 red  Balance:  Narrow base of support EO and EC 2x30 seconds each   Tandem stance EO 2x30 sec hold    Date: 01/05/24 Pt seen for aquatic therapy today.  Treatment took place in water  3.5-4.75 ft in depth at the [facility] pool. Temp of water  was 91.  Pt entered/exited the pool via stairs with bil rail.  -walking forward and  back -Stair negotiation alternating steps ue support intermittently on handrails (iincreasingly as decreased submersion) -step up leading R/L 2 x 5. VC for reduced UE support - tandem gait forward/ backward ->with rainbow hand floats (good balance challenge) - alternating toe taps to 1st step x 10, without UE support->2nd step -L stretch-> hip hiking - wall push up/off for reactive balance x 10 - TrA set with hollow long noodle in standard stance x 10, staggered stance x 8 (improving balance and control good challenge)  - suitcase carry with bil rainbow hand float, marching forward / backward -walking forward and back between exercises fro recovery  01/01/24 STM to glutes, lumbar paraspinals  Standing marches x20 Standing hip abduction x10 Gait training in hallway  Gait training with hurdles and blue airex pad    Date: 12/29/23  PN testing: BERG ; muscle; lumbar ROM   Pt seen for aquatic therapy today.  Treatment took place in water  3.5-4.75 ft in depth at the [facility] pool. Temp of water  was 91.  Pt entered/exited the pool via stairs with bil rail.  -walking forward and backward. Cues for arm swing -STS from bench onto water  step. VC for immediate standing balance -Tandem stance and SLS ue support RBHB then without -stair negotiation ascending and descending using alternating pattern with hand rails for safety.  Pt requires the buoyancy and hydrostatic pressure of water  for support, and to offload joints by unweighting joint load by at least 50 % in navel deep water  and by at least 75-80% in chest to neck deep water .  Viscosity of the water  is needed for resistance of strengthening. Water  current perturbations provides challenge to standing balance requiring increased core activation.   12/24/23: - Nustep L5x8 minutes all four extremities for w/u  - LAQ with 1lb weight  - Marching with 1b weight on floor for 1 min, on airex for 1 min - 1 lap around clinic with gait  belt with cognitive challenge - Step up onto 4 step alternating - Stepping over hurdles in fwd, lateral direction at bar.   12/18/23  Nustep L5x8 minutes all four extremities for w/u   Tandem stance solid surface 3x30 seconds B  Narrow BOS 4x30 seconds  Tandem walking x3 laps next to table, MinA from PT for balance Lateral weight shifts x60 seconds Narrow BOS blue foam pad 3x30 seconds SLS with one foot on blue yoga block 3x30 seconds B         PATIENT EDUCATION:  Education details: intro to aquatic therapy   Person educated: Patient Education method: Explanation, demonstration Education comprehension: verbalized understanding  HOME EXERCISE PROGRAM: Access Code: [email or web address] URL: [email or web address] Date: 11/09/2023 Prepared by: Dale Call  ASSESSMENT:  CLINICAL IMPRESSION:   Arrives today doing well, we updated some measures and goals today- she continues to make steady progress thus far. She is much more steady on her feet as per skilled observation and improvement in Berg score, however I think definitely has capacity to continue improving. Recommend continuation of skilled PT services as per established  POC.       Re-cert/PN: pt progressing well with skilled physical therapy intervention. Functional and objective testing as noted above demonstrate improvements in strength, lumbar ROM and balance reducing pt's fall risk. She does report an additional fall in past month but reports tripping over an object in her path at home (without injury). She verbalizes noticing a difference in her function at home as her strength and balance has improved. She is consistently using Rolator when out of home for added safety. Pt will benefit from continued physical therapy to progress towards meeting all stated goals.        OBJECTIVE IMPAIRMENTS: Abnormal gait, decreased activity tolerance, decreased balance, decreased knowledge of use of DME, decreased mobility,  difficulty walking, decreased ROM, decreased strength, postural dysfunction, obesity, and pain.   ACTIVITY LIMITATIONS: carrying, lifting, bending, sitting, standing, squatting, stairs, and transfers  PARTICIPATION LIMITATIONS: meal prep, cleaning, laundry, driving, shopping, and community activity  PERSONAL FACTORS: Age and 1-2 comorbidities: see PmHx are also affecting patient's functional outcome.   REHAB POTENTIAL: Good  CLINICAL DECISION MAKING: Evolving/moderate complexity  EVALUATION COMPLEXITY: Moderate   GOALS: Goals reviewed with patient? Yes  SHORT TERM GOALS: Target date: 12/14/23 (delay in beginning therapy)  Pt will tolerate full aquatic sessions consistently without increase in pain and with improving function to demonstrate good toleration and effectiveness of intervention.  Baseline: Goal status: Met 12/29/23  2.  Pt will tolerate stair climbing in and out of pool using approp pattern ascending and descending 6 steps with use of handrail  Baseline:  Goal status: Met 12/29/23  3.  Pt will perform 10 STS from water  bench onto water  step unsupported without LOB Baseline:  Goal status:  Met 12/29/23  4.  Pt and cg will consider grab bars for commode  Baseline:  Goal status: deferred as pt reports she is rising off of a raised commode without difficulty 12/29/23  5.  Pt will be indep and compliant with initial land based HEP for improving strength and safety at home Baseline:  Goal status: Met 12/29/23    LONG TERM GOALS: Target date: 03/19/23  Pt to improve on ODI by 13% to demonstrate statistically significant Improvement in function. (MCID 13-15%) Baseline: 23/50=46% Goal status: INITIAL  2.  Pt will improve strength in bil hip flex and right knee ext by 1 grade to demonstrate improved overall physical function Baseline: see chart Goal status: PARTIALLY MET 01/25/24  3.  Pt will improve on Berg balance test to >/= 35/56 to demonstrate a decrease in fall  risk. (MCD 7) Baseline:27/56 (medium fall risk) ; 34/56 Goal status: MET 01/25/24 see above   4.  Pt will improve on 30s STS test  from standard chair from 3 to 6 to demonstrate improving functional lower extremity strength, transitional movements, and balance. (MDC = 4.2sec)  Baseline: 3 Goal status: INITIAL  5.  Pt will report decrease in pain by at least 50% for improved toleration to activity/quality of life and to demonstrate improved management of pain. Baseline: see chart Goal status: INITIAL  6.  Pt will be indep with final HEP's (land and aquatic as appropriate) for continued management of condition Baseline: see chart Goal status: INITIAL  PLAN:  PT FREQUENCY: 2x/week  PT DURATION: 10 w(extended out due to holiday schedule) alternate land/aquatics  PLANNED INTERVENTIONS: 97164- PT Re-evaluation, 97750- Physical Performance Testing, 97110-Therapeutic exercises, 97530- Therapeutic activity, [id number]- Neuromuscular re-education, 97535- Self Care, 02859- Manual therapy, [id number]- Gait training, [id number]- Aquatic Therapy, [phone]-  Ionotophoresis 4mg /ml Dexamethasone , 20560 (1-2 muscles), 20561 (3+ muscles)- Dry Needling, Patient/Family education, Balance training, Stair training, Taping, Joint mobilization, DME instructions, Cryotherapy, and Moist heat.  PLAN FOR NEXT SESSION: aquatic: core and le strengthening, transfers, stair climbing, balance retraining  Land:  continue to progress balance and glute/quad strength, continue with more dynamic surfaces/activities    Josette Rough, PT, DPT 01/25/24 3:12 PM

## 2024-01-25 NOTE — Progress Notes (Signed)
   01/25/24 0001  Standardized Balance Assessment  Standardized Balance Assessment Berg Balance Test  Berg Balance Test  Sit to Stand 4  Standing Unsupported 4  Sitting with Back Unsupported but Feet Supported on Floor or Stool 4  Stand to Sit 4  Transfers 4  Standing Unsupported with Eyes Closed 3  Standing Unsupported with Feet Together 3  From Standing, Reach Forward with Outstretched Arm 3  From Standing Position, Pick up Object from Floor 3  From Standing Position, Turn to Look Behind Over each Shoulder 3  Turn 360 Degrees 2  Standing Unsupported, Alternately Place Feet on Step/Stool 3  Standing Unsupported, One Foot in Front 0  Standing on One Leg 3  Total Score 43

## 2024-01-28 ENCOUNTER — Ambulatory Visit: Admitting: Sports Medicine

## 2024-01-29 ENCOUNTER — Ambulatory Visit (HOSPITAL_BASED_OUTPATIENT_CLINIC_OR_DEPARTMENT_OTHER): Payer: Self-pay | Admitting: Physical Therapy

## 2024-02-01 ENCOUNTER — Ambulatory Visit (HOSPITAL_BASED_OUTPATIENT_CLINIC_OR_DEPARTMENT_OTHER): Payer: Self-pay | Admitting: Physical Therapy

## 2024-02-01 ENCOUNTER — Encounter (HOSPITAL_BASED_OUTPATIENT_CLINIC_OR_DEPARTMENT_OTHER): Payer: Self-pay | Admitting: Physical Therapy

## 2024-02-01 ENCOUNTER — Other Ambulatory Visit: Payer: Self-pay

## 2024-02-01 DIAGNOSIS — M6281 Muscle weakness (generalized): Secondary | ICD-10-CM

## 2024-02-01 DIAGNOSIS — M5459 Other low back pain: Secondary | ICD-10-CM

## 2024-02-01 DIAGNOSIS — R2681 Unsteadiness on feet: Secondary | ICD-10-CM

## 2024-02-01 NOTE — Therapy (Signed)
 OUTPATIENT PHYSICAL THERAPY THORACOLUMBAR TREATMENT    Patient Name: [name] MRN: [id number] DOB:[birth year], 83 y.o., female Today's Date: 02/01/2024  END OF SESSION:  PT End of Session - 02/01/24 1516     Visit Number 17    Date for Recertification  03/18/24    Authorization Type aetna Mcr    Progress Note Due on Visit 20    PT Start Time 1515    PT Stop Time 1556    PT Time Calculation (min) 41 min    Activity Tolerance Patient tolerated treatment well    Behavior During Therapy [facility] for tasks assessed/performed                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   Hyperlipidemia    Impaired hearing    left ear, wears hearing aids   Insomnia    circadian rhythm component   Insomnia    Migraine 11/1986   Spillman   Nuclear sclerosis    OSA on CPAP 03/2005   uses cpap setting of 10   Osteoarthritis 2007   Deveschwar   Pain last week   left under breast pain    Plantar fasciitis    Rheumatic fever    Scarlet fever as child   Sleep apnea    Swallowing difficulty    Thyroid  disorder    Vitamin D  deficiency    Vitreous degeneration of right eye    Past Surgical History:  Procedure Laterality Date   BREAST BIOPSY Left 6/00   [place]   BREAST EXCISIONAL BIOPSY Left 1998   DE QUERVAIN'S RELEASE Right 10/97   Sypher   left breast biopsy     ROOT CANAL  02/11/2012   TONSILLECTOMY  age 41   TONSILLECTOMY AND ADENOIDECTOMY  1952   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   TOTAL KNEE ARTHROPLASTY Right 11/08/2012   Procedure: RIGHT TOTAL KNEE ARTHROPLASTY;  Surgeon: Dempsey LULLA Moan, MD;  Location: [facility];  Service: Orthopedics;  Laterality: Right;   TUBAL LIGATION     Patient Active Problem List   Diagnosis  Date Noted   Venous stasis of lower extremity 07/28/2023   Great toe pain, right 06/16/2023   Acute pain of right knee 05/28/2023   Closed compression fracture of body of first lumbar vertebra (HCC) 05/28/2023   Bilateral lower extremity edema 02/03/2022   Fluid retention 12/17/2021   Low back pain 10/03/2021   Atherosclerotic heart disease of native coronary artery without angina pectoris 05/27/2021   Diastolic dysfunction 05/27/2021   Neck pain 05/27/2021   Migraine 04/23/2021   Generalized obesity 04/23/2021   Depression 03/18/2021   Lumbar spine pain 08/30/2020   Flatulence, eructation and gas pain 05/07/2020   Change in bowel habit 05/07/2020   Gout 06/22/2019   Post viral syndrome 06/15/2019   Cervicalgia 06/15/2019   Chronic tension-type headache, intractable 06/15/2019   Fall 04/21/2018   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   Diverticulosis 12/23/2017   History of adenomatous polyp of colon 12/23/2017   Recurrent falls 12/14/2017   Degenerative disc disease, cervical 12/14/2017   Chronic constipation 12/14/2017   Floaters in visual field, bilateral 08/10/2017   Head  injury consultation 08/10/2017   Arthritis of carpometacarpal [facility]) joint of right thumb 05/14/2017   Therapeutic opioid-induced constipation (OIC) 02/05/2017   BMI 29.0-29.9,adult 02/05/2017   Dry mouth 02/05/2017   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/08/2017   Intolerance of continuous positive airway pressure (CPAP) ventilation 01/08/2017   Benign neoplasm of colon 11/11/2016   Other long term (current) drug therapy 11/11/2016   History of total knee arthroplasty, 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   Cough 03/06/2016   At risk for injury related to fall 02/14/2016   Primary osteoarthritis of both hands 02/04/2016   Osteopenia of multiple sites 02/04/2016   Metatarsalgia of both feet 02/04/2016   Migraines 02/04/2016   Other fatigue 02/01/2016   Melena 08/20/2015   Acute recurrent maxillary sinusitis  08/20/2015   Slow transit constipation 08/20/2015   Vitamin D  deficiency 08/20/2015   Thyroid  activity decreased 08/20/2015   Cephalalgia 08/20/2015   Preop cardiovascular exam 07/20/2015   Subacute ethmoidal sinusitis 12/11/2014   Chronic infection of sinus 07/12/2014   Nasal septal ulcer 05/25/2014   DJD (degenerative joint disease), cervical 03/22/2014   Right hand pain 03/22/2014   Deflected nasal septum 01/30/2014   Hypertrophy of nasal turbinates 01/30/2014   UARS (upper airway resistance syndrome) 10/04/2013   Insomnia secondary to depression with anxiety 10/04/2013   Exertional dyspnea 08/20/2013   History of rheumatic fever 08/20/2013   Hypomania (mild) single episode or unspecified 08/09/2013   OSA on CPAP 04/06/2013   Postoperative anemia due to acute blood loss 11/09/2012   OA (osteoarthritis) of knee 11/08/2012   History of giardia infection 07/15/2012   Atrophic vaginitis 07/15/2012   Vitreous degeneration of right eye    Nuclear sclerosis    Right shoulder pain 06/18/2011   Foot pain, left 06/18/2011   Hyperlipidemia 03/24/2011   Disorder of bone 02/12/2009   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   Migraine, unspecified, not intractable, without status migrainosus 02/12/2009    PCP: Garnette Ore MD  REFERRING PROVIDER: Helene Haddock MD  REFERRING DIAG:  [phone] (ICD-10-CM) - Acute pain of right knee  S32.010A (ICD-10-CM) - Closed compression fracture of body of L1 vertebra (HCC)    Rationale for Evaluation and Treatment: Rehabilitation  THERAPY DIAG:  Other low back pain  Muscle weakness (generalized)  Unsteadiness on feet  ONSET DATE: March 2025  SUBJECTIVE:                                                                                                                                                                                           SUBJECTIVE STATEMENT:  I just feel tired today, I'm doing better and  my exercises are getting easier. I  didn't know what to do because of the weather. Feel like I pulled my back all the way from my shoulder down to my hip on the right.     EvalBETHA Green in June. Have had  2 compression fx.    2 falls in one day early march 1st compression fx then again in June sustaining a second.  Has a membership here at [facility]. I have been Walking in pool 4 x week for 45 mins to 1 hr. But I still hurt. I want to work on my balance and stand up from a chair without using my arms.  Standing up from a chair my right knee and back hurts. Using rollator when walking back to pool but not any other time  PERTINENT HISTORY:  Evaluate and treat for L1 compression fx and right knee pain. Include aquatic therapy in treatment plan.  Chronic fatigue; fibromyalgia  PAIN:  Are you having pain? yes: NPRS scale:6/10 Pain location: R side from shoulder all the way down to hip  Pain description: very tight like a pulled muscle  Aggravating factors: not sure  Relieving factors: rest. Voltaren  and heat   PRECAUTIONS: Fall  RED FLAGS: None   WEIGHT BEARING RESTRICTIONS: No  FALLS:  Has patient fallen in last 6 months? Yes. Number of falls 1 fall backwards after putting my foot on a chair to toe my shoe.  LIVING ENVIRONMENT: Lives with: lives with their spouse Lives in: House/apartment Has following equipment at home: Single point cane, Environmental Consultant - 2 wheeled, Environmental Consultant - 4 wheeled, and Grab bars  OCCUPATION: retired financial controller  PLOF: Independent  PATIENT GOALS: Improve balance, get in and out of chairs, car and bed better  NEXT MD VISIT: as needed  OBJECTIVE:  Note: Objective measures were completed at Evaluation unless otherwise noted.  DIAGNOSTIC FINDINGS:  7/25 CT Lumbar IMPRESSION: 1. Subacute compression deformities at T12 and L1 with loss of height of 10% at each level. Sclerotic change consistent with ongoing healing process. I cannot state by CT if there is complete healing. No retropulsed  bone. 2. L3-4: Mild disc bulge and facet osteoarthritis. No compressive stenosis. 3. L4-5: Mild disc bulge. Bilateral facet degeneration and hypertrophy. 1-2 mm of degenerative anterolisthesis. Mild narrowing of both lateral recesses, not grossly compressive. 4. L5-S1: Endplate osteophytes and bulging of the disc. Bilateral facet osteoarthritis worse on the right. No compressive stenosis.  05/29/23 Xray R knee IMPRESSION: 1. 9.3 x 3.9 x 3.7 cm heterogenous layering hypodense fluid collection most compatible with a hematoma extending along the superficial fascia of the right posterolateral thigh, overlying and compressing the biceps femoris musculature, which could represent a soft tissue degloving injury, such as a Morel-Lavallee lesion. This collection appears to continue below the level of the knee into the lateral gastrocnemius muscle, concerning for intramuscular hematoma. 2. Intact right total knee arthroplasty. No acute osseous abnormality. 3. Small knee joint effusion.  PATIENT SURVEYS:  ODI: 23/50=46%  COGNITION: Overall cognitive status: Within functional limits for tasks assessed     SENSATION: WFL  MUSCLE LENGTH: Hamstrings: wfl   POSTURE: rounded shoulders, forward head, decreased thoracic kyphosis, and left pelvic obliquity  PALPATION: Right patella crepitus  LUMBAR ROM:   AROM eval 12/29/23  Flexion full   Extension    Right lateral flexion 90% limited P! 50% limited P!  Left lateral flexion 90% limited P! 50% limited P!  Right rotation    Left rotation     (  Blank rows = not tested)  LOWER EXTREMITY ROM:     Active  Right eval Left eval  Hip flexion    Hip extension    Hip abduction    Hip adduction    Hip internal rotation    Hip external rotation    Knee flexion 110 128  Knee extension 0 0  Ankle dorsiflexion    Ankle plantarflexion    Ankle inversion    Ankle eversion     (Blank rows = not tested)  LOWER EXTREMITY MMT:    MMT  Right eval Left eval R / L 12/29/23 R 01/25/24 L 01/25/24  Hip flexion 3 3 3+ / 3+ 3 3+  Hip extension       Hip abduction 4+ 4+ 5- / 5- 4- sidelying  4+ sidelying   Hip abd 4- 4- 5 /5    Hip internal rotation       Hip external rotation       Knee flexion 4 5 5 5 5   Knee extension 3+ 4 4 4+ 4+  Ankle dorsiflexion       Ankle plantarflexion       Ankle inversion       Ankle eversion        (Blank rows = not tested)  LUMBAR SPECIAL TESTS:  Slump test: Negative  FUNCTIONAL TESTS:  30 seconds chair stand test Timed up and go (TUG): 21.24   Item Test date: 11/05/23 Date: 12/29/23 Date: 01/25/24  Sitting to standing 2. able to stand using hands after several tries 3. able to stand independently using hands  4. able to stand safely for 2 minutes  4. able to sit safely and securely for 2 minutes   3. controls descent by using hands  3. able to transfer safely with definite need of hands  4. able to stand 10 seconds safely  3. able to place feet together independently and stand 1 minute with supervision  4. can reach forward confidently 25 cm (10 inches)    2. unable to pick up but reaches 2-5 cm (1-2 inches) from slipper and keeps balance independently  3. looks behind one side only, other side shows less weight shift    1. needs close supervision or verbal cuing  0. needs assistance to keep from falling/unable to try    0. loses balance while stepping or standing   0. unable to try of needs assist to prevent fall     Insert SmartPhrase OPRCBERGREEVAL  2. Standing unsupported 4. able to stand safely for 2 minutes    3. Sitting with back /unsupported, feet supported 4. able to sit safely and securely for 2 minutes    4. Standing to sitting 2. uses back of legs against chair to control descent    5. Pivot transfer  3. able to transfer safely with definite need of hands    6. Standing unsupported with eyes closed 2. able to stand 3 seconds    7. Standing unsupported with feet  together 1. needs help to attain position but able to stand 15 seconds feet together    8. Reaching forward with outstretched arms while standing 2. can reach forward 5 cm (2 inches)    9. Pick up object from the floor from standing 3. able to pick up slipper but needs supervision    10. Turning to look behind over left and right shoulders while standing 3. looks behind one side only, other side shows less weight  shift    11. Turn 360 degrees 1. needs close supervision or verbal cuing    12. Place alternate foot on step or stool while standing unsupported 0. needs assistance to keep from falling/unable to try    13. Standing unsupported one foot in front 0. loses balance while stepping or standing    14. Standing on one leg 0. unable to try of needs assist to prevent fall      Total Score 27/56 Total Score:   34/56 Total Score: 43/56       01/25/24 0001  Standardized Balance Assessment  Standardized Balance Assessment Berg Balance Test  Berg Balance Test  Sit to Stand 4  Standing Unsupported 4  Sitting with Back Unsupported but Feet Supported on Floor or Stool 4  Stand to Sit 4  Transfers 4  Standing Unsupported with Eyes Closed 3  Standing Unsupported with Feet Together 3  From Standing, Reach Forward with Outstretched Arm 3  From Standing Position, Pick up Object from Floor 3  From Standing Position, Turn to Look Behind Over each Shoulder 3  Turn 360 Degrees 2  Standing Unsupported, Alternately Place Feet on Step/Stool 3  Standing Unsupported, One Foot in Front 0  Standing on One Leg 3  Total Score 43        GAIT: Distance walked: 400 ft Assistive device utilized: rollator Level of assistance: Complete Independence Comments: quickened short step length, reduced hip/knee flex, little arm swing  TREATMENT  [facility] Adult PT Treatment:                                               02/01/24  Nustep L6x8 minutes seat 10 all four extremities   Lumbar rotation stretch 5x5  seconds B  SKTC 5x5 seconds B Figure 4 stretch B 2x30 seconds Thoracic flexion/extension AROM x10   Tandem stance blue foam 6x30 seconds alternating  Sidesteps on blue foam pad x3 laps      01/25/24  Nustep L6x8 minutes seat 10 all four extremities  MMT, Berg, goals, education on progress with PT  Tandem stance blue foam pad 6x30 seconds alternating Standing marches/tapping top of 6 inch box blue foam pad x20  Forward and lateral steps over hurdles at bar x2 laps Lateral steps over hurdles x1 lap at bar, got light headed- BP 133/72 HR 76, dizziness resolved before we checked SpO2     01/20/24 Pt seen for aquatic therapy today.  Treatment took place in water  3.5-4.75 ft in depth at the [facility] pool. Temp of water  was 91.  Pt entered/exited the pool via stairs with bil rail.  -walking forward and back -wide stance ue horizontal add/abd -[place] (difficulty coordinating). Focused on weight shift back/forth. -alternating ue row wide stance -open book R/L x 5 - wall push up/off for reactive balance x 10.  Cues for stronger push to activate balance reaction in ankles -weight shift forward->back; back->forward x 5 ue support yellow hb -ue support wall stepping over opposite foot R/L->grapevine. Ue support HB along the length of the wall for extra support -walking forward and back between exercises fro recovery  01/15/24 Nustep lvl 5, 7 min Step up onto forward 6 step, bilat  Step up laterally onto 6 bilat  Stepping over hurdles fwd and lateral with focus on big steps Seated high marches SL stance on, bilat  LE with finger tip hold, 30 sec hold Tandem stance, bilat with finger tip hold, 30 sec hold 2 laps around clinic with SPC with gait belt to assess gait mechanics.    11/13  There-ex:  Lumbar ball stretch x 5 sec hold  Lateral ball stretch 5x 5 sec hold  Neuro re-ed:  LAQ 2x10 each leg  Hip abduction 2x10 red  Balance:  Narrow base of support  EO and EC 2x30 seconds each   Tandem stance EO 2x30 sec hold    Date: 01/05/24 Pt seen for aquatic therapy today.  Treatment took place in water  3.5-4.75 ft in depth at the [facility] pool. Temp of water  was 91.  Pt entered/exited the pool via stairs with bil rail.  -walking forward and back -Stair negotiation alternating steps ue support intermittently on handrails (iincreasingly as decreased submersion) -step up leading R/L 2 x 5. VC for reduced UE support - tandem gait forward/ backward ->with rainbow hand floats (good balance challenge) - alternating toe taps to 1st step x 10, without UE support->2nd step -L stretch-> hip hiking - wall push up/off for reactive balance x 10 - TrA set with hollow long noodle in standard stance x 10, staggered stance x 8 (improving balance and control good challenge)  - suitcase carry with bil rainbow hand float, marching forward / backward -walking forward and back between exercises fro recovery  01/01/24 STM to glutes, lumbar paraspinals  Standing marches x20 Standing hip abduction x10 Gait training in hallway  Gait training with hurdles and blue airex pad    Date: 12/29/23  PN testing: BERG ; muscle; lumbar ROM   Pt seen for aquatic therapy today.  Treatment took place in water  3.5-4.75 ft in depth at the [facility] pool. Temp of water  was 91.  Pt entered/exited the pool via stairs with bil rail.  -walking forward and backward. Cues for arm swing -STS from bench onto water  step. VC for immediate standing balance -Tandem stance and SLS ue support RBHB then without -stair negotiation ascending and descending using alternating pattern with hand rails for safety.  Pt requires the buoyancy and hydrostatic pressure of water  for support, and to offload joints by unweighting joint load by at least 50 % in navel deep water  and by at least 75-80% in chest to neck deep water .  Viscosity of the water  is needed for resistance of  strengthening. Water  current perturbations provides challenge to standing balance requiring increased core activation.   12/24/23: - Nustep L5x8 minutes all four extremities for w/u  - LAQ with 1lb weight  - Marching with 1b weight on floor for 1 min, on airex for 1 min - 1 lap around clinic with gait belt with cognitive challenge - Step up onto 4 step alternating - Stepping over hurdles in fwd, lateral direction at bar.   12/18/23  Nustep L5x8 minutes all four extremities for w/u   Tandem stance solid surface 3x30 seconds B  Narrow BOS 4x30 seconds  Tandem walking x3 laps next to table, MinA from PT for balance Lateral weight shifts x60 seconds Narrow BOS blue foam pad 3x30 seconds SLS with one foot on blue yoga block 3x30 seconds B         PATIENT EDUCATION:  Education details: intro to aquatic therapy   Person educated: Patient Education method: Explanation, demonstration Education comprehension: verbalized understanding  HOME EXERCISE PROGRAM:  Access Code: [email or web address] URL: [email or web address] Date: 02/01/2024 Prepared by: Josette Rough  Exercises - Theracane Over  Shoulder  - 1 x daily - 7 x weekly - 3 sets - 10 reps - Seated Bilateral Shoulder Flexion Towel Slide at Table Top  - 1 x daily - 7 x weekly - 3 sets - 10 reps - Sit to Stand with Arms Crossed  - 2 x daily - 7 x weekly - 2 sets - 10 reps - Seated Hip Abduction with Resistance  - 1 x daily - 7 x weekly - 3 sets - 10 reps - Seated Long Arc Quad  - 1 x daily - 7 x weekly - 3 sets - 10 reps - Standing March with Counter Support  - 1 x daily - 7 x weekly - 3 sets - 10 reps - Standing Hip Abduction with Counter Support  - 1 x daily - 7 x weekly - 3 sets - 10 reps - Standing Hip Extension with Counter Support  - 1 x daily - 7 x weekly - 3 sets - 10 reps - Narrow Stance with Counter Support  - 1 x daily - 7 x weekly - 3 sets - 10 reps - Standing Tandem Balance with Counter Support  - 1 x daily  - 7 x weekly - 3 sets - 10 reps - Supine Lower Trunk Rotation  - 1 x daily - 7 x weekly - 1-2 sets - 10 reps - 5 seconds  hold - Supine Single Knee to Chest Stretch  - 1 x daily - 7 x weekly - 1-2 sets - 10 reps - 5 seconds  hold - Supine Figure 4 Piriformis Stretch  - 1 x daily - 7 x weekly - 1-2 sets - 6 reps - 30 seconds  hold - Seated Thoracic Flexion and Extension  - 1 x daily - 7 x weekly - 1-2 sets - 10 reps   ASSESSMENT:  CLINICAL IMPRESSION:   Arrives today with a little extra back pain from R shoulder through R low back and down into hip- we spent a little extra time trying to mobilize and work this out with moderate success, otherwise continued working on balance as time allowed.       Re-cert/PN: pt progressing well with skilled physical therapy intervention. Functional and objective testing as noted above demonstrate improvements in strength, lumbar ROM and balance reducing pt's fall risk. She does report an additional fall in past month but reports tripping over an object in her path at home (without injury). She verbalizes noticing a difference in her function at home as her strength and balance has improved. She is consistently using Rolator when out of home for added safety. Pt will benefit from continued physical therapy to progress towards meeting all stated goals.        OBJECTIVE IMPAIRMENTS: Abnormal gait, decreased activity tolerance, decreased balance, decreased knowledge of use of DME, decreased mobility, difficulty walking, decreased ROM, decreased strength, postural dysfunction, obesity, and pain.   ACTIVITY LIMITATIONS: carrying, lifting, bending, sitting, standing, squatting, stairs, and transfers  PARTICIPATION LIMITATIONS: meal prep, cleaning, laundry, driving, shopping, and community activity  PERSONAL FACTORS: Age and 1-2 comorbidities: see PmHx are also affecting patient's functional outcome.   REHAB POTENTIAL: Good  CLINICAL DECISION MAKING:  Evolving/moderate complexity  EVALUATION COMPLEXITY: Moderate   GOALS: Goals reviewed with patient? Yes  SHORT TERM GOALS: Target date: 12/14/23 (delay in beginning therapy)  Pt will tolerate full aquatic sessions consistently without increase in pain and with improving function to demonstrate good toleration and effectiveness of intervention.  Baseline: Goal status:  Met 12/29/23  2.  Pt will tolerate stair climbing in and out of pool using approp pattern ascending and descending 6 steps with use of handrail  Baseline:  Goal status: Met 12/29/23  3.  Pt will perform 10 STS from water  bench onto water  step unsupported without LOB Baseline:  Goal status:  Met 12/29/23  4.  Pt and cg will consider grab bars for commode  Baseline:  Goal status: deferred as pt reports she is rising off of a raised commode without difficulty 12/29/23  5.  Pt will be indep and compliant with initial land based HEP for improving strength and safety at home Baseline:  Goal status: Met 12/29/23    LONG TERM GOALS: Target date: 03/19/23  Pt to improve on ODI by 13% to demonstrate statistically significant Improvement in function. (MCID 13-15%) Baseline: 23/50=46% Goal status: INITIAL  2.  Pt will improve strength in bil hip flex and right knee ext by 1 grade to demonstrate improved overall physical function Baseline: see chart Goal status: PARTIALLY MET 01/25/24  3.  Pt will improve on Berg balance test to >/= 35/56 to demonstrate a decrease in fall risk. (MCD 7) Baseline:27/56 (medium fall risk) ; 34/56 Goal status: MET 01/25/24 see above   4.  Pt will improve on 30s STS test  from standard chair from 3 to 6 to demonstrate improving functional lower extremity strength, transitional movements, and balance. (MDC = 4.2sec)  Baseline: 3 Goal status: INITIAL  5.  Pt will report decrease in pain by at least 50% for improved toleration to activity/quality of life and to demonstrate improved management of  pain. Baseline: see chart Goal status: INITIAL  6.  Pt will be indep with final HEP's (land and aquatic as appropriate) for continued management of condition Baseline: see chart Goal status: INITIAL  PLAN:  PT FREQUENCY: 2x/week  PT DURATION: 10 w(extended out due to holiday schedule) alternate land/aquatics  PLANNED INTERVENTIONS: [id number]- PT Re-evaluation, 97750- Physical Performance Testing, 97110-Therapeutic exercises, 97530- Therapeutic activity, 97112- Neuromuscular re-education, 97535- Self Care, [id number]- Manual therapy, [id number]- Gait training, [id number]- Aquatic Therapy, [phone]- Ionotophoresis 4mg /ml Dexamethasone , 79439 (1-2 muscles), 20561 (3+ muscles)- Dry Needling, Patient/Family education, Balance training, Stair training, Taping, Joint mobilization, DME instructions, Cryotherapy, and Moist heat.  PLAN FOR NEXT SESSION: aquatic: core and le strengthening, transfers, stair climbing, balance retraining  Land:  continue to progress balance and glute/quad strength, continue with more dynamic surfaces/activities, continue to work on back spasm if its still present   Josette Rough, PT, DPT 02/01/24 3:58 PM

## 2024-02-04 ENCOUNTER — Other Ambulatory Visit: Payer: Self-pay

## 2024-02-04 ENCOUNTER — Ambulatory Visit (INDEPENDENT_AMBULATORY_CARE_PROVIDER_SITE_OTHER): Admitting: Sports Medicine

## 2024-02-04 VITALS — BP 122/59 | Ht 66.0 in | Wt 190.0 lb

## 2024-02-04 DIAGNOSIS — R296 Repeated falls: Secondary | ICD-10-CM

## 2024-02-04 NOTE — Assessment & Plan Note (Signed)
 Today her balance was improved.  Her overall fall risk based on her ability to do a sit to stand and to get up and go test looks to be reduced now that she has been doing regular therapy  I think we should continue her physical therapy and when that is completed we need to keep her in some physical activity class to maintain her strength.  She seems to now be using her cane correctly and I think she needs to keep this with her for protection.  I do want her to talk to her psychiatrist and other physicians to see if there are any medications that we can cut down on.  The doxepin  and tramadol  have been essential at controlling her migraines and I am hesitant to change those

## 2024-02-04 NOTE — Progress Notes (Signed)
 Discussed the use of AI scribe software for clinical note transcription with the patient, who gave verbal consent to proceed.  History of Present Illness [name] is an 83 year old female who presents for evaluation of her balance and strength.  Falls and mechanical injury - Multiple falls, most recently on October 11 after tripping over shoes, resulting in nasal fracture and periorbital ecchymosis - Previous fall occurred while tying shoes with foot on a kitchen chair, resulting in backward fall and finger dislocation - Currently wears slip-on shoes to reduce fall risk - No dizziness or palpitations preceding falls - Falls attributed to clear mechanical causes rather than spontaneous imbalance  Balance and strength impairment - Participates in pool and land therapy - Improvement in steadiness and strength since starting therapy - Difficulty rising from low chairs, especially in restaurants - Performs prescribed exercises at least once daily - Marked improvement in ability to rise from a chair without using arms  Medication use and side effects - Takes tramadol  three to four times daily - At night takes four melatonin, Lunesta , and doxepin  - No drowsiness or sedation from current medications - Feels Lunesta  is essential for sleep  Migraine management - Long history of migraines - Migraines well controlled with doxepin   Physical Exam MUSCULOSKELETAL:   Pleasant older lady in no acute distress  BP (!) 122/59   Ht 5' 6 (1.676 m)   Wt 190 lb (86.2 kg)   LMP 02/25/1995   BMI 30.67 kg/m  neck ROM: extension limited to 10 degrees, flexion good, rotation 30 degrees, lateral bending 20 degrees.  Able to rise from chair without using arms. Ambulates with cane, trunk upright. NEUROLOGICAL: Motor strength in upper extremities good, no cogwheeling or tremor.  Hip flexion and quadriceps strength very good.  Able to walk five steps without balance change. Gait is improved versus  prior visits with much less instability or horizontal wobble

## 2024-02-05 ENCOUNTER — Encounter (HOSPITAL_BASED_OUTPATIENT_CLINIC_OR_DEPARTMENT_OTHER): Payer: Self-pay | Admitting: Physical Therapy

## 2024-02-05 ENCOUNTER — Ambulatory Visit (HOSPITAL_BASED_OUTPATIENT_CLINIC_OR_DEPARTMENT_OTHER): Payer: Self-pay | Admitting: Physical Therapy

## 2024-02-05 DIAGNOSIS — M6281 Muscle weakness (generalized): Secondary | ICD-10-CM

## 2024-02-05 DIAGNOSIS — M5459 Other low back pain: Secondary | ICD-10-CM

## 2024-02-05 DIAGNOSIS — R2681 Unsteadiness on feet: Secondary | ICD-10-CM

## 2024-02-05 NOTE — Therapy (Signed)
 OUTPATIENT PHYSICAL THERAPY THORACOLUMBAR TREATMENT    Patient Name: [name] MRN: [id number] DOB:12/21/1940, 83 y.o., female Today's Date: 02/05/2024  END OF SESSION:  PT End of Session - 02/05/24 0902     Visit Number 18    Date for Recertification  03/18/24    Authorization Type aetna Mcr    Progress Note Due on Visit 20    PT Start Time 0846    PT Stop Time 0925    PT Time Calculation (min) 39 min    Activity Tolerance Patient tolerated treatment well    Behavior During Therapy 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   Hyperlipidemia    Impaired hearing    left ear, wears hearing aids   Insomnia    circadian rhythm component   Insomnia    Migraine 11/1986   Spillman   Nuclear sclerosis    OSA on CPAP 03/2005   uses cpap setting of 10   Osteoarthritis 2007   Deveschwar   Pain last week   left under breast pain    Plantar fasciitis    Rheumatic fever    Scarlet fever as child   Sleep apnea    Swallowing difficulty    Thyroid  disorder    Vitamin D  deficiency    Vitreous degeneration of right eye    Past Surgical History:  Procedure Laterality Date   BREAST BIOPSY Left 6/00   Hardcastle   BREAST EXCISIONAL BIOPSY Left 1998   DE QUERVAIN'S RELEASE Right 10/97   Sypher   left breast biopsy     ROOT CANAL  02/11/2012   TONSILLECTOMY  age 89   TONSILLECTOMY AND ADENOIDECTOMY  1952   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   TOTAL KNEE ARTHROPLASTY Right 11/08/2012   Procedure: RIGHT TOTAL KNEE ARTHROPLASTY;  Surgeon: Dempsey LULLA Moan, MD;  Location: [facility];  Service: Orthopedics;  Laterality: Right;   TUBAL LIGATION     Patient Active Problem List    Diagnosis Date Noted   Venous stasis of lower extremity 07/28/2023   Great toe pain, right 06/16/2023   Acute pain of right knee 05/28/2023   Closed compression fracture of body of first lumbar vertebra (HCC) 05/28/2023   Bilateral lower extremity edema 02/03/2022   Fluid retention 12/17/2021   Low back pain 10/03/2021   Atherosclerotic heart disease of native coronary artery without angina pectoris 05/27/2021   Diastolic dysfunction 05/27/2021   Neck pain 05/27/2021   Migraine 04/23/2021   Generalized obesity 04/23/2021   Depression 03/18/2021   Lumbar spine pain 08/30/2020   Flatulence, eructation and gas pain 05/07/2020   Change in bowel habit 05/07/2020   Gout 06/22/2019   Post viral syndrome 06/15/2019   Cervicalgia 06/15/2019   Chronic tension-type headache, intractable 06/15/2019   Fall 04/21/2018   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   Diverticulosis 12/23/2017   History of adenomatous polyp of colon 12/23/2017   Recurrent falls 12/14/2017   Degenerative disc disease, cervical 12/14/2017   Chronic constipation 12/14/2017   Floaters in visual field, bilateral 08/10/2017  Head injury consultation 08/10/2017   Arthritis of carpometacarpal [facility]) joint of right thumb 05/14/2017   Therapeutic opioid-induced constipation (OIC) 02/05/2017   BMI 29.0-29.9,adult 02/05/2017   Dry mouth 02/05/2017   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/08/2017   Intolerance of continuous positive airway pressure (CPAP) ventilation 01/08/2017   Benign neoplasm of colon 11/11/2016   Other long term (current) drug therapy 11/11/2016   History of total knee arthroplasty, 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   Cough 03/06/2016   At risk for injury related to fall 02/14/2016   Primary osteoarthritis of both hands 02/04/2016   Osteopenia of multiple sites 02/04/2016   Metatarsalgia of both feet 02/04/2016   Migraines 02/04/2016   Other fatigue 02/01/2016   Melena 08/20/2015   Acute recurrent maxillary  sinusitis 08/20/2015   Slow transit constipation 08/20/2015   Vitamin D  deficiency 08/20/2015   Thyroid  activity decreased 08/20/2015   Cephalalgia 08/20/2015   Preop cardiovascular exam 07/20/2015   Subacute ethmoidal sinusitis 12/11/2014   Chronic infection of sinus 07/12/2014   Nasal septal ulcer 05/25/2014   DJD (degenerative joint disease), cervical 03/22/2014   Right hand pain 03/22/2014   Deflected nasal septum 01/30/2014   Hypertrophy of nasal turbinates 01/30/2014   UARS (upper airway resistance syndrome) 10/04/2013   Insomnia secondary to depression with anxiety 10/04/2013   Exertional dyspnea 08/20/2013   History of rheumatic fever 08/20/2013   Hypomania (mild) single episode or unspecified 08/09/2013   OSA on CPAP 04/06/2013   Postoperative anemia due to acute blood loss 11/09/2012   OA (osteoarthritis) of knee 11/08/2012   History of giardia infection 07/15/2012   Atrophic vaginitis 07/15/2012   Vitreous degeneration of right eye    Nuclear sclerosis    Right shoulder pain 06/18/2011   Foot pain, left 06/18/2011   Hyperlipidemia 03/24/2011   Disorder of bone 02/12/2009   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   Migraine, unspecified, not intractable, without status migrainosus 02/12/2009    PCP: Garnette Ore MD  REFERRING PROVIDER: Helene Haddock MD  REFERRING DIAG:  [phone] (ICD-10-CM) - Acute pain of right knee  S32.010A (ICD-10-CM) - Closed compression fracture of body of L1 vertebra (HCC)    Rationale for Evaluation and Treatment: Rehabilitation  THERAPY DIAG:  Other low back pain  Muscle weakness (generalized)  Unsteadiness on feet  ONSET DATE: March 2025  SUBJECTIVE:                                                                                                                                                                                           SUBJECTIVE STATEMENT: My back on right side felt better after last  treatment.  Pain 5/10 right  in middle of very low back through hips   Eval:  [name] in June. Have had  2 compression fx.    2 falls in one day early march 1st compression fx then again in June sustaining a second.  Has a membership here at [facility]. I have been Walking in pool 4 x week for 45 mins to 1 hr. But I still hurt. I want to work on my balance and stand up from a chair without using my arms.  Standing up from a chair my right knee and back hurts. Using rollator when walking back to pool but not any other time  PERTINENT HISTORY:  Evaluate and treat for L1 compression fx and right knee pain. Include aquatic therapy in treatment plan.  Chronic fatigue; fibromyalgia  PAIN:  Are you having pain? yes: NPRS scale:6/10 Pain location: R side from shoulder all the way down to hip  Pain description: very tight like a pulled muscle  Aggravating factors: not sure  Relieving factors: rest. Voltaren  and heat   PRECAUTIONS: Fall  RED FLAGS: None   WEIGHT BEARING RESTRICTIONS: No  FALLS:  Has patient fallen in last 6 months? Yes. Number of falls 1 fall backwards after putting my foot on a chair to toe my shoe.  LIVING ENVIRONMENT: Lives with: lives with their spouse Lives in: House/apartment Has following equipment at home: Single point cane, Environmental Consultant - 2 wheeled, Environmental Consultant - 4 wheeled, and Grab bars  OCCUPATION: retired financial controller  PLOF: Independent  PATIENT GOALS: Improve balance, get in and out of chairs, car and bed better  NEXT MD VISIT: as needed  OBJECTIVE:  Note: Objective measures were completed at Evaluation unless otherwise noted.  DIAGNOSTIC FINDINGS:  7/25 CT Lumbar IMPRESSION: 1. Subacute compression deformities at T12 and L1 with loss of height of 10% at each level. Sclerotic change consistent with ongoing healing process. I cannot state by CT if there is complete healing. No retropulsed bone. 2. L3-4: Mild disc bulge and facet osteoarthritis. No compressive stenosis. 3. L4-5: Mild  disc bulge. Bilateral facet degeneration and hypertrophy. 1-2 mm of degenerative anterolisthesis. Mild narrowing of both lateral recesses, not grossly compressive. 4. L5-S1: Endplate osteophytes and bulging of the disc. Bilateral facet osteoarthritis worse on the right. No compressive stenosis.  05/29/23 Xray R knee IMPRESSION: 1. 9.3 x 3.9 x 3.7 cm heterogenous layering hypodense fluid collection most compatible with a hematoma extending along the superficial fascia of the right posterolateral thigh, overlying and compressing the biceps femoris musculature, which could represent a soft tissue degloving injury, such as a Morel-Lavallee lesion. This collection appears to continue below the level of the knee into the lateral gastrocnemius muscle, concerning for intramuscular hematoma. 2. Intact right total knee arthroplasty. No acute osseous abnormality. 3. Small knee joint effusion.  PATIENT SURVEYS:  ODI: 23/50=46%  COGNITION: Overall cognitive status: Within functional limits for tasks assessed     SENSATION: WFL  MUSCLE LENGTH: Hamstrings: wfl   POSTURE: rounded shoulders, forward head, decreased thoracic kyphosis, and left pelvic obliquity  PALPATION: Right patella crepitus  LUMBAR ROM:   AROM eval 12/29/23  Flexion full   Extension    Right lateral flexion 90% limited P! 50% limited P!  Left lateral flexion 90% limited P! 50% limited P!  Right rotation    Left rotation     (Blank rows = not tested)  LOWER EXTREMITY ROM:     Active  Right eval Left eval  Hip flexion    Hip extension    Hip abduction    Hip adduction    Hip internal rotation    Hip external rotation    Knee flexion 110 128  Knee extension 0 0  Ankle dorsiflexion    Ankle plantarflexion    Ankle inversion    Ankle eversion     (Blank rows = not tested)  LOWER EXTREMITY MMT:    MMT Right eval Left eval R / L 12/29/23 R 01/25/24 L 01/25/24  Hip flexion 3 3 3+ / 3+ 3 3+  Hip  extension       Hip abduction 4+ 4+ 5- / 5- 4- sidelying  4+ sidelying   Hip abd 4- 4- 5 /5    Hip internal rotation       Hip external rotation       Knee flexion 4 5 5 5 5   Knee extension 3+ 4 4 4+ 4+  Ankle dorsiflexion       Ankle plantarflexion       Ankle inversion       Ankle eversion        (Blank rows = not tested)  LUMBAR SPECIAL TESTS:  Slump test: Negative  FUNCTIONAL TESTS:  30 seconds chair stand test Timed up and go (TUG): 21.24   Item Test date: 11/05/23 Date: 12/29/23 Date: 01/25/24  Sitting to standing 2. able to stand using hands after several tries 3. able to stand independently using hands  4. able to stand safely for 2 minutes  4. able to sit safely and securely for 2 minutes   3. controls descent by using hands  3. able to transfer safely with definite need of hands  4. able to stand 10 seconds safely  3. able to place feet together independently and stand 1 minute with supervision  4. can reach forward confidently 25 cm (10 inches)    2. unable to pick up but reaches 2-5 cm (1-2 inches) from slipper and keeps balance independently  3. looks behind one side only, other side shows less weight shift    1. needs close supervision or verbal cuing  0. needs assistance to keep from falling/unable to try    0. loses balance while stepping or standing   0. unable to try of needs assist to prevent fall     Insert SmartPhrase OPRCBERGREEVAL  2. Standing unsupported 4. able to stand safely for 2 minutes    3. Sitting with back /unsupported, feet supported 4. able to sit safely and securely for 2 minutes    4. Standing to sitting 2. uses back of legs against chair to control descent    5. Pivot transfer  3. able to transfer safely with definite need of hands    6. Standing unsupported with eyes closed 2. able to stand 3 seconds    7. Standing unsupported with feet together 1. needs help to attain position but able to stand 15 seconds feet together    8.  Reaching forward with outstretched arms while standing 2. can reach forward 5 cm (2 inches)    9. Pick up object from the floor from standing 3. able to pick up slipper but needs supervision    10. Turning to look behind over left and right shoulders while standing 3. looks behind one side only, other side shows less weight shift    11. Turn 360 degrees 1. needs close supervision or verbal cuing    12. Place  alternate foot on step or stool while standing unsupported 0. needs assistance to keep from falling/unable to try    13. Standing unsupported one foot in front 0. loses balance while stepping or standing    14. Standing on one leg 0. unable to try of needs assist to prevent fall      Total Score 27/56 Total Score:   34/56 Total Score: 43/56       01/25/24 0001  Standardized Balance Assessment  Standardized Balance Assessment Berg Balance Test  Berg Balance Test  Sit to Stand 4  Standing Unsupported 4  Sitting with Back Unsupported but Feet Supported on Floor or Stool 4  Stand to Sit 4  Transfers 4  Standing Unsupported with Eyes Closed 3  Standing Unsupported with Feet Together 3  From Standing, Reach Forward with Outstretched Arm 3  From Standing Position, Pick up Object from Floor 3  From Standing Position, Turn to Look Behind Over each Shoulder 3  Turn 360 Degrees 2  Standing Unsupported, Alternately Place Feet on Step/Stool 3  Standing Unsupported, One Foot in Front 0  Standing on One Leg 3  Total Score 43        GAIT: Distance walked: 400 ft Assistive device utilized: rollator Level of assistance: Complete Independence Comments: quickened short step length, reduced hip/knee flex, little arm swing  TREATMENT  [facility] Adult PT Treatment:                                               02/05/24 Pt seen for aquatic therapy today.  Treatment took place in water  3.5-4.75 ft in depth at the [facility] pool. Temp of water  was 91.  Pt entered/exited the pool via  stairs with bil rail.  -walking forward and back -L stretch->hip hiking -side stepping with ue horizontal add/abd -wide stance ue horizontal add/abd -Bow and Arrow (difficulty coordinating). Focused on weight shift back/forth. -STS from water  bench onto step unsupported 2x 10.  VC and demonstration for execution. Slowed descent.  Good execution no LOB -figure 4 stretch at steps (Not tolerated well) -open book R/L x 5    02/01/24  Nustep L6x8 minutes seat 10 all four extremities   Lumbar rotation stretch 5x5 seconds B  SKTC 5x5 seconds B Figure 4 stretch B 2x30 seconds Thoracic flexion/extension AROM x10   Tandem stance blue foam 6x30 seconds alternating  Sidesteps on blue foam pad x3 laps      01/25/24  Nustep L6x8 minutes seat 10 all four extremities  MMT, Berg, goals, education on progress with PT  Tandem stance blue foam pad 6x30 seconds alternating Standing marches/tapping top of 6 inch box blue foam pad x20  Forward and lateral steps over hurdles at bar x2 laps Lateral steps over hurdles x1 lap at bar, got light headed- BP 133/72 HR 76, dizziness resolved before we checked SpO2     01/20/24 Pt seen for aquatic therapy today.  Treatment took place in water  3.5-4.75 ft in depth at the [facility] pool. Temp of water  was 91.  Pt entered/exited the pool via stairs with bil rail.  -walking forward and back -wide stance ue horizontal add/abd -Bow and Arrow (difficulty coordinating). Focused on weight shift back/forth. -alternating ue row wide stance -open book R/L x 5 - wall push up/off for reactive balance x 10.  Cues for stronger push  to activate balance reaction in ankles -weight shift forward->back; back->forward x 5 ue support yellow hb -ue support wall stepping over opposite foot R/L->grapevine. Ue support HB along the length of the wall for extra support -walking forward and back between exercises fro recovery  01/15/24 Nustep lvl 5, 7 min Step  up onto forward 6 step, bilat  Step up laterally onto 6 bilat  Stepping over hurdles fwd and lateral with focus on big steps Seated high marches SL stance on, bilat LE with finger tip hold, 30 sec hold Tandem stance, bilat with finger tip hold, 30 sec hold 2 laps around clinic with SPC with gait belt to assess gait mechanics.    11/13  There-ex:  Lumbar ball stretch x 5 sec hold  Lateral ball stretch 5x 5 sec hold  Neuro re-ed:  LAQ 2x10 each leg  Hip abduction 2x10 red  Balance:  Narrow base of support EO and EC 2x30 seconds each   Tandem stance EO 2x30 sec hold    Date: 01/05/24 Pt seen for aquatic therapy today.  Treatment took place in water  3.5-4.75 ft in depth at the [facility] pool. Temp of water  was 91.  Pt entered/exited the pool via stairs with bil rail.  -walking forward and back -Stair negotiation alternating steps ue support intermittently on handrails (iincreasingly as decreased submersion) -step up leading R/L 2 x 5. VC for reduced UE support - tandem gait forward/ backward ->with rainbow hand floats (good balance challenge) - alternating toe taps to 1st step x 10, without UE support->2nd step -L stretch-> hip hiking - wall push up/off for reactive balance x 10 - TrA set with hollow long noodle in standard stance x 10, staggered stance x 8 (improving balance and control good challenge)  - suitcase carry with bil rainbow hand float, marching forward / backward -walking forward and back between exercises fro recovery  01/01/24 STM to glutes, lumbar paraspinals  Standing marches x20 Standing hip abduction x10 Gait training in hallway  Gait training with hurdles and blue airex pad      PATIENT EDUCATION:  Education details: intro to aquatic therapy   Person educated: Patient Education method: Explanation, demonstration Education comprehension: verbalized understanding  HOME EXERCISE PROGRAM:  Access Code: [email or web address] URL:  [email or web address] Date: 02/01/2024 Prepared by: Josette Rough  Exercises - Theracane Over Shoulder  - 1 x daily - 7 x weekly - 3 sets - 10 reps - Seated Bilateral Shoulder Flexion Towel Slide at Table Top  - 1 x daily - 7 x weekly - 3 sets - 10 reps - Sit to Stand with Arms Crossed  - 2 x daily - 7 x weekly - 2 sets - 10 reps - Seated Hip Abduction with Resistance  - 1 x daily - 7 x weekly - 3 sets - 10 reps - Seated Long Arc Quad  - 1 x daily - 7 x weekly - 3 sets - 10 reps - Standing March with Counter Support  - 1 x daily - 7 x weekly - 3 sets - 10 reps - Standing Hip Abduction with Counter Support  - 1 x daily - 7 x weekly - 3 sets - 10 reps - Standing Hip Extension with Counter Support  - 1 x daily - 7 x weekly - 3 sets - 10 reps - Narrow Stance with Counter Support  - 1 x daily - 7 x weekly - 3 sets - 10 reps - Standing Tandem Balance with Counter Support  -  1 x daily - 7 x weekly - 3 sets - 10 reps - Supine Lower Trunk Rotation  - 1 x daily - 7 x weekly - 1-2 sets - 10 reps - 5 seconds  hold - Supine Single Knee to Chest Stretch  - 1 x daily - 7 x weekly - 1-2 sets - 10 reps - 5 seconds  hold - Supine Figure 4 Piriformis Stretch  - 1 x daily - 7 x weekly - 1-2 sets - 6 reps - 30 seconds  hold - Seated Thoracic Flexion and Extension  - 1 x daily - 7 x weekly - 1-2 sets - 10 reps   ASSESSMENT:  CLINICAL IMPRESSION: Worked balance moving in different directions and with decreasing BOS. Good challenge without any LOB.  Increased unsteadiness with reducing BOS. Pt a little more guarded with movement today. Of note: right sided discomfort continues from shoulder to lb and is limiting movement . Goal ongoing        Re-cert/PN: pt progressing well with skilled physical therapy intervention. Functional and objective testing as noted above demonstrate improvements in strength, lumbar ROM and balance reducing pt's fall risk. She does report an additional fall in past  month but reports tripping over an object in her path at home (without injury). She verbalizes noticing a difference in her function at home as her strength and balance has improved. She is consistently using Rolator when out of home for added safety. Pt will benefit from continued physical therapy to progress towards meeting all stated goals.        OBJECTIVE IMPAIRMENTS: Abnormal gait, decreased activity tolerance, decreased balance, decreased knowledge of use of DME, decreased mobility, difficulty walking, decreased ROM, decreased strength, postural dysfunction, obesity, and pain.   ACTIVITY LIMITATIONS: carrying, lifting, bending, sitting, standing, squatting, stairs, and transfers  PARTICIPATION LIMITATIONS: meal prep, cleaning, laundry, driving, shopping, and community activity  PERSONAL FACTORS: Age and 1-2 comorbidities: see PmHx are also affecting patient's functional outcome.   REHAB POTENTIAL: Good  CLINICAL DECISION MAKING: Evolving/moderate complexity  EVALUATION COMPLEXITY: Moderate   GOALS: Goals reviewed with patient? Yes  SHORT TERM GOALS: Target date: 12/14/23 (delay in beginning therapy)  Pt will tolerate full aquatic sessions consistently without increase in pain and with improving function to demonstrate good toleration and effectiveness of intervention.  Baseline: Goal status: Met 12/29/23  2.  Pt will tolerate stair climbing in and out of pool using approp pattern ascending and descending 6 steps with use of handrail  Baseline:  Goal status: Met 12/29/23  3.  Pt will perform 10 STS from water  bench onto water  step unsupported without LOB Baseline:  Goal status:  Met 12/29/23  4.  Pt and cg will consider grab bars for commode  Baseline:  Goal status: deferred as pt reports she is rising off of a raised commode without difficulty 12/29/23  5.  Pt will be indep and compliant with initial land based HEP for improving strength and safety at home Baseline:   Goal status: Met 12/29/23    LONG TERM GOALS: Target date: 03/19/23  Pt to improve on ODI by 13% to demonstrate statistically significant Improvement in function. (MCID 13-15%) Baseline: 23/50=46% Goal status: INITIAL  2.  Pt will improve strength in bil hip flex and right knee ext by 1 grade to demonstrate improved overall physical function Baseline: see chart Goal status: PARTIALLY MET 01/25/24  3.  Pt will improve on Berg balance test to >/= 35/56 to demonstrate a decrease  in fall risk. (MCD 7) Baseline:27/56 (medium fall risk) ; 34/56 Goal status: MET 01/25/24 see above   4.  Pt will improve on 30s STS test  from standard chair from 3 to 6 to demonstrate improving functional lower extremity strength, transitional movements, and balance. (MDC = 4.2sec)  Baseline: 3 Goal status: INITIAL  5.  Pt will report decrease in pain by at least 50% for improved toleration to activity/quality of life and to demonstrate improved management of pain. Baseline: see chart Goal status: INITIAL  6.  Pt will be indep with final HEP's (land and aquatic as appropriate) for continued management of condition Baseline: see chart Goal status: INITIAL  PLAN:  PT FREQUENCY: 2x/week  PT DURATION: 10 w(extended out due to holiday schedule) alternate land/aquatics  PLANNED INTERVENTIONS: 97164- PT Re-evaluation, 97750- Physical Performance Testing, 97110-Therapeutic exercises, 97530- Therapeutic activity, 97112- Neuromuscular re-education, 97535- Self Care, 02859- Manual therapy, [id number]- Gait training, [phone]- Aquatic Therapy, [phone]- Ionotophoresis 4mg /ml Dexamethasone , 79439 (1-2 muscles), 20561 (3+ muscles)- Dry Needling, Patient/Family education, Balance training, Stair training, Taping, Joint mobilization, DME instructions, Cryotherapy, and Moist heat.  PLAN FOR NEXT SESSION: aquatic: core and le strengthening, transfers, stair climbing, balance retraining  Land:  continue to progress balance and  glute/quad strength, continue with more dynamic surfaces/activities, continue to work on back spasm if its still present   Ronal Kem) Rabon Scholle MPT 02/05/2024 9:04 AM [facility] [address] [place], [place], [address] Phone: [phone]   Fax:  [phone]

## 2024-02-08 ENCOUNTER — Encounter (HOSPITAL_BASED_OUTPATIENT_CLINIC_OR_DEPARTMENT_OTHER): Payer: Self-pay | Admitting: Physical Therapy

## 2024-02-08 ENCOUNTER — Ambulatory Visit (HOSPITAL_BASED_OUTPATIENT_CLINIC_OR_DEPARTMENT_OTHER): Payer: Self-pay | Admitting: Physical Therapy

## 2024-02-08 DIAGNOSIS — R2681 Unsteadiness on feet: Secondary | ICD-10-CM

## 2024-02-08 DIAGNOSIS — G8929 Other chronic pain: Secondary | ICD-10-CM

## 2024-02-08 DIAGNOSIS — M6281 Muscle weakness (generalized): Secondary | ICD-10-CM

## 2024-02-08 DIAGNOSIS — M5459 Other low back pain: Secondary | ICD-10-CM | POA: Diagnosis not present

## 2024-02-08 NOTE — Therapy (Signed)
 OUTPATIENT PHYSICAL THERAPY THORACOLUMBAR TREATMENT  Progress Note Reporting Period 12/30/23 to 02/08/24  See note below for Objective Data and Assessment of Progress/Goals.        Patient Name: [name] MRN: [id number] DOB:1941/02/03, 83 y.o., female Today's Date: 02/08/2024  END OF SESSION:  PT End of Session - 02/08/24 1156     Visit Number 19    Date for Recertification  03/18/24    Authorization Type aetna Mcr    Progress Note Due on Visit 29   last progress note done visit 19   PT Start Time 1150    PT Stop Time 1228    PT Time Calculation (min) 38 min    Activity Tolerance Patient tolerated treatment well    Behavior During Therapy WFL for tasks assessed/performed                  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   Hyperlipidemia    Impaired hearing    left ear, wears hearing aids   Insomnia    circadian rhythm component   Insomnia    Migraine 11/1986   Spillman   Nuclear sclerosis    OSA on CPAP 03/2005   uses cpap setting of 10   Osteoarthritis 2007   Deveschwar   Pain last week   left under breast pain    Plantar fasciitis    Rheumatic fever    Scarlet fever as child   Sleep apnea    Swallowing difficulty    Thyroid  disorder    Vitamin D  deficiency    Vitreous degeneration of right eye    Past Surgical History:  Procedure Laterality Date   BREAST BIOPSY Left 6/00   [place]   BREAST EXCISIONAL BIOPSY Left 1998   DE QUERVAIN'S RELEASE Right 10/97   Sypher   left breast biopsy     ROOT CANAL  02/11/2012   TONSILLECTOMY  age 46   TONSILLECTOMY AND ADENOIDECTOMY  1952   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   TOTAL KNEE ARTHROPLASTY Right 11/08/2012   Procedure: RIGHT TOTAL  KNEE ARTHROPLASTY;  Surgeon: Dempsey LULLA Moan, MD;  Location: [facility];  Service: Orthopedics;  Laterality: Right;   TUBAL LIGATION     Patient Active Problem List   Diagnosis Date Noted   Venous stasis of lower extremity 07/28/2023   Great toe pain, right 06/16/2023   Acute pain of right knee 05/28/2023   Closed compression fracture of body of first lumbar vertebra (HCC) 05/28/2023   Bilateral lower extremity edema 02/03/2022   Fluid retention 12/17/2021   Low back pain 10/03/2021   Atherosclerotic heart disease of native coronary artery without angina pectoris 05/27/2021   Diastolic dysfunction 05/27/2021   Neck pain 05/27/2021   Migraine 04/23/2021   Generalized obesity 04/23/2021   Depression 03/18/2021   Lumbar spine pain 08/30/2020   Flatulence, eructation and gas pain 05/07/2020   Change in bowel habit 05/07/2020   Gout 06/22/2019   Post viral syndrome 06/15/2019   Cervicalgia 06/15/2019   Chronic tension-type headache, intractable 06/15/2019   Fall 04/21/2018   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   Diverticulosis 12/23/2017   History of adenomatous  polyp of colon 12/23/2017   Recurrent falls 12/14/2017   Degenerative disc disease, cervical 12/14/2017   Chronic constipation 12/14/2017   Floaters in visual field, bilateral 08/10/2017   Head injury consultation 08/10/2017   Arthritis of carpometacarpal [facility]) joint of right thumb 05/14/2017   Therapeutic opioid-induced constipation (OIC) 02/05/2017   BMI 29.0-29.9,adult 02/05/2017   Dry mouth 02/05/2017   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/08/2017   Intolerance of continuous positive airway pressure (CPAP) ventilation 01/08/2017   Benign neoplasm of colon 11/11/2016   Other long term (current) drug therapy 11/11/2016   History of total knee arthroplasty, 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   Cough 03/06/2016   At risk for injury related to fall 02/14/2016   Primary osteoarthritis of both hands 02/04/2016   Osteopenia of  multiple sites 02/04/2016   Metatarsalgia of both feet 02/04/2016   Migraines 02/04/2016   Other fatigue 02/01/2016   Melena 08/20/2015   Acute recurrent maxillary sinusitis 08/20/2015   Slow transit constipation 08/20/2015   Vitamin D  deficiency 08/20/2015   Thyroid  activity decreased 08/20/2015   Cephalalgia 08/20/2015   Preop cardiovascular exam 07/20/2015   Subacute ethmoidal sinusitis 12/11/2014   Chronic infection of sinus 07/12/2014   Nasal septal ulcer 05/25/2014   DJD (degenerative joint disease), cervical 03/22/2014   Right hand pain 03/22/2014   Deflected nasal septum 01/30/2014   Hypertrophy of nasal turbinates 01/30/2014   UARS (upper airway resistance syndrome) 10/04/2013   Insomnia secondary to depression with anxiety 10/04/2013   Exertional dyspnea 08/20/2013   History of rheumatic fever 08/20/2013   Hypomania (mild) single episode or unspecified 08/09/2013   OSA on CPAP 04/06/2013   Postoperative anemia due to acute blood loss 11/09/2012   OA (osteoarthritis) of knee 11/08/2012   History of giardia infection 07/15/2012   Atrophic vaginitis 07/15/2012   Vitreous degeneration of right eye    Nuclear sclerosis    Right shoulder pain 06/18/2011   Foot pain, left 06/18/2011   Hyperlipidemia 03/24/2011   Disorder of bone 02/12/2009   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 Would give myself a 65/100 right now, its still hard for me to lift my feet and get dressed in standing. It feels like my feet won't go where I want them to go.    EvalBETHA Rasmussen in June. Have had  2 compression fx.    2 falls in one day early march 1st compression fx then again in June sustaining a second.  Has a membership here at [facility]. I have been Walking in pool 4 x week for 45 mins to 1 hr. But I still hurt. I want to work on my balance and stand up from a chair without using my arms.  Standing up from a chair my right knee and back hurts. Using rollator when walking back to pool but not any other time  PERTINENT HISTORY:  Evaluate and treat for L1 compression fx and right knee pain. Include aquatic therapy in treatment plan.  Chronic fatigue; fibromyalgia  PAIN:  Are you having pain? yes: NPRS scale:5/10 Pain location: R side low back Pain description: very tight, sore to touch   Aggravating factors: not sure Relieving factors: rest. Voltaren  and heat   PRECAUTIONS: Fall  RED FLAGS: None   WEIGHT BEARING RESTRICTIONS: No  FALLS:  Has patient fallen in last 6 months? Yes. Number of falls 1 fall backwards after putting my foot on a chair to toe my shoe.  LIVING ENVIRONMENT: Lives with: lives with their spouse Lives in: House/apartment Has following equipment at home: Single point cane, Environmental Consultant - 2 wheeled, Environmental Consultant - 4 wheeled, and Grab bars  OCCUPATION: retired financial controller  PLOF: Independent  PATIENT GOALS: Improve balance, get in and out of chairs, car and bed better  NEXT MD VISIT: as needed  OBJECTIVE:  Note: Objective measures were completed at Evaluation unless otherwise noted.  DIAGNOSTIC FINDINGS:  7/25 CT Lumbar IMPRESSION: 1. Subacute compression deformities at  T12 and L1 with loss of height of 10% at each level. Sclerotic change consistent with ongoing healing process. I cannot state by CT if there is complete healing. No retropulsed bone. 2. L3-4: Mild disc bulge and facet osteoarthritis. No compressive stenosis. 3. L4-5: Mild disc bulge. Bilateral facet degeneration and hypertrophy. 1-2 mm of degenerative anterolisthesis. Mild narrowing of both lateral recesses, not grossly compressive. 4. L5-S1: Endplate osteophytes and bulging of the disc. Bilateral facet osteoarthritis worse on the right. No compressive stenosis.  05/29/23 Xray R knee IMPRESSION: 1. 9.3 x 3.9 x 3.7 cm heterogenous layering hypodense fluid collection most compatible with a hematoma extending along the superficial fascia of the right posterolateral thigh, overlying and compressing the biceps femoris musculature, which could represent a soft tissue degloving injury, such as a Morel-Lavallee lesion. This collection appears to continue below the level of the knee into the lateral gastrocnemius muscle, concerning for intramuscular hematoma. 2. Intact right total knee arthroplasty. No acute osseous abnormality. 3. Small knee joint effusion.  PATIENT SURVEYS:  ODI: 23/50=46%                       ODI 20/50 = 40% COGNITION: Overall cognitive status: Within functional limits for tasks assessed     SENSATION: WFL  MUSCLE LENGTH: Hamstrings: wfl   POSTURE: rounded shoulders, forward head, decreased thoracic kyphosis, and left pelvic obliquity  PALPATION: Right patella crepitus  LUMBAR ROM:   AROM eval 12/29/23 02/08/24  Flexion full  Extension     Right lateral flexion 90% limited P! 50% limited P! 40% limited P!   Left lateral flexion 90% limited P! 50% limited P! 75% limited P!  Right rotation     Left rotation      (Blank rows = not tested)  LOWER EXTREMITY ROM:     Active  Right eval Left eval  Hip flexion    Hip extension    Hip abduction    Hip  adduction    Hip internal rotation    Hip external rotation    Knee flexion 110 128  Knee extension 0 0  Ankle dorsiflexion    Ankle plantarflexion    Ankle inversion    Ankle eversion     (Blank rows = not tested)  LOWER EXTREMITY MMT:    MMT Right eval Left eval R / L 12/29/23 R 01/25/24 L 01/25/24 R 02/08/24 L 02/08/24  Hip flexion 3 3 3+ / 3+ 3 3+ 4 4  Hip extension         Hip abduction 4+ 4+ 5- / 5- 4- sidelying  4+ sidelying  4 4  Hip abd 4- 4- 5 /5      Hip internal rotation         Hip external rotation         Knee flexion 4 5 5 5 5  4+ 4+  Knee extension 3+ 4 4 4+ 4+ 4+ 4+  Ankle dorsiflexion         Ankle plantarflexion         Ankle inversion         Ankle eversion          (Blank rows = not tested)  LUMBAR SPECIAL TESTS:  Slump test: Negative  FUNCTIONAL TESTS:  30 seconds chair stand test Timed up and go (TUG): 21.24   02/08/24 TUG 13.5 seconds no device  30 second chair stand test x9 reps using UEs on chair       Item Test date: 11/05/23 Date: 12/29/23 Date: 01/25/24  Sitting to standing 2. able to stand using hands after several tries 3. able to stand independently using hands  4. able to stand safely for 2 minutes  4. able to sit safely and securely for 2 minutes   3. controls descent by using hands  3. able to transfer safely with definite need of hands  4. able to stand 10 seconds safely  3. able to place feet together independently and stand 1 minute with supervision  4. can reach forward confidently 25 cm (10 inches)    2. unable to pick up but reaches 2-5 cm (1-2 inches) from slipper and keeps balance independently  3. looks behind one side only, other side shows less weight shift    1. needs close supervision or verbal cuing  0. needs assistance to keep from falling/unable to try    0. loses balance while stepping or standing   0. unable to try of needs assist to prevent fall     Insert SmartPhrase OPRCBERGREEVAL  2. Standing  unsupported 4. able to stand safely for 2 minutes    3. Sitting with back /unsupported, feet supported 4. able to sit safely and securely for 2 minutes    4. Standing to sitting 2. uses back of legs against chair to control descent    5. Pivot transfer  3. able to transfer safely with definite need of hands    6. Standing unsupported with  eyes closed 2. able to stand 3 seconds    7. Standing unsupported with feet together 1. needs help to attain position but able to stand 15 seconds feet together    8. Reaching forward with outstretched arms while standing 2. can reach forward 5 cm (2 inches)    9. Pick up object from the floor from standing 3. able to pick up slipper but needs supervision    10. Turning to look behind over left and right shoulders while standing 3. looks behind one side only, other side shows less weight shift    11. Turn 360 degrees 1. needs close supervision or verbal cuing    12. Place alternate foot on step or stool while standing unsupported 0. needs assistance to keep from falling/unable to try    13. Standing unsupported one foot in front 0. loses balance while stepping or standing    14. Standing on one leg 0. unable to try of needs assist to prevent fall      Total Score 27/56 Total Score:   34/56 Total Score: 43/56       01/25/24 0001  Standardized Balance Assessment  Standardized Balance Assessment Berg Balance Test  Berg Balance Test  Sit to Stand 4  Standing Unsupported 4  Sitting with Back Unsupported but Feet Supported on Floor or Stool 4  Stand to Sit 4  Transfers 4  Standing Unsupported with Eyes Closed 3  Standing Unsupported with Feet Together 3  From Standing, Reach Forward with Outstretched Arm 3  From Standing Position, Pick up Object from Floor 3  From Standing Position, Turn to Look Behind Over each Shoulder 3  Turn 360 Degrees 2  Standing Unsupported, Alternately Place Feet on Step/Stool 3  Standing Unsupported, One Foot in Front 0   Standing on One Leg 3  Total Score 43        GAIT: Distance walked: 400 ft Assistive device utilized: rollator Level of assistance: Complete Independence Comments: quickened short step length, reduced hip/knee flex, little arm swing  TREATMENT  [facility] Adult PT Treatment:                                               02/08/24  Nustep L6x8 minutes seat 10 all four extremities   ODI, MMT, TUG, 30 second chair stand test, lumbar ROM, goals  Tandem stance blue foam 6x30 seconds alternating SLS x15 seconds B alternating    02/05/24 Pt seen for aquatic therapy today.  Treatment took place in water  3.5-4.75 ft in depth at the [facility] pool. Temp of water  was 91.  Pt entered/exited the pool via stairs with bil rail.  -walking forward and back -L stretch->hip hiking -side stepping with ue horizontal add/abd -wide stance ue horizontal add/abd -Bow and Arrow (difficulty coordinating). Focused on weight shift back/forth. -STS from water  bench onto step unsupported 2x 10.  VC and demonstration for execution. Slowed descent.  Good execution no LOB -figure 4 stretch at steps (Not tolerated well) -open book R/L x 5    02/01/24  Nustep L6x8 minutes seat 10 all four extremities   Lumbar rotation stretch 5x5 seconds B  SKTC 5x5 seconds B Figure 4 stretch B 2x30 seconds Thoracic flexion/extension AROM x10   Tandem stance blue foam 6x30 seconds alternating  Sidesteps on blue foam pad x3 laps  01/25/24  Nustep L6x8 minutes seat 10 all four extremities  MMT, Berg, goals, education on progress with PT  Tandem stance blue foam pad 6x30 seconds alternating Standing marches/tapping top of 6 inch box blue foam pad x20  Forward and lateral steps over hurdles at bar x2 laps Lateral steps over hurdles x1 lap at bar, got light headed- BP 133/72 HR 76, dizziness resolved before we checked SpO2     01/20/24 Pt seen for aquatic therapy today.  Treatment took place  in water  3.5-4.75 ft in depth at the [facility] pool. Temp of water  was 91.  Pt entered/exited the pool via stairs with bil rail.  -walking forward and back -wide stance ue horizontal add/abd -Bow and Arrow (difficulty coordinating). Focused on weight shift back/forth. -alternating ue row wide stance -open book R/L x 5 - wall push up/off for reactive balance x 10.  Cues for stronger push to activate balance reaction in ankles -weight shift forward->back; back->forward x 5 ue support yellow hb -ue support wall stepping over opposite foot R/L->grapevine. Ue support HB along the length of the wall for extra support -walking forward and back between exercises fro recovery  01/15/24 Nustep lvl 5, 7 min Step up onto forward 6 step, bilat  Step up laterally onto 6 bilat  Stepping over hurdles fwd and lateral with focus on big steps Seated high marches SL stance on, bilat LE with finger tip hold, 30 sec hold Tandem stance, bilat with finger tip hold, 30 sec hold 2 laps around clinic with SPC with gait belt to assess gait mechanics.    11/13  There-ex:  Lumbar ball stretch x 5 sec hold  Lateral ball stretch 5x 5 sec hold  Neuro re-ed:  LAQ 2x10 each leg  Hip abduction 2x10 red  Balance:  Narrow base of support EO and EC 2x30 seconds each   Tandem stance EO 2x30 sec hold    Date: 01/05/24 Pt seen for aquatic therapy today.  Treatment took place in water  3.5-4.75 ft in depth at the [facility] pool. Temp of water  was 91.  Pt entered/exited the pool via stairs with bil rail.  -walking forward and back -Stair negotiation alternating steps ue support intermittently on handrails (iincreasingly as decreased submersion) -step up leading R/L 2 x 5. VC for reduced UE support - tandem gait forward/ backward ->with rainbow hand floats (good balance challenge) - alternating toe taps to 1st step x 10, without UE support->2nd step -L stretch-> hip hiking - wall push  up/off for reactive balance x 10 - TrA set with hollow long noodle in standard stance x 10, staggered stance x 8 (improving balance and control good challenge)  - suitcase carry with bil rainbow hand float, marching forward / backward -walking forward and back between exercises fro recovery  01/01/24 STM to glutes, lumbar paraspinals  Standing marches x20 Standing hip abduction x10 Gait training in hallway  Gait training with hurdles and blue airex pad      PATIENT EDUCATION:  Education details: intro to aquatic therapy   Person educated: Patient Education method: Explanation, demonstration Education comprehension: verbalized understanding  HOME EXERCISE PROGRAM:  Access Code: [email or web address] URL: [email or web address] Date: 02/01/2024 Prepared by: Josette Rough  Exercises - Theracane Over Shoulder  - 1 x daily - 7 x weekly - 3 sets - 10 reps - Seated Bilateral Shoulder Flexion Towel Slide at Table Top  - 1 x daily - 7 x weekly - 3 sets - 10 reps - Sit  to Stand with Arms Crossed  - 2 x daily - 7 x weekly - 2 sets - 10 reps - Seated Hip Abduction with Resistance  - 1 x daily - 7 x weekly - 3 sets - 10 reps - Seated Long Arc Quad  - 1 x daily - 7 x weekly - 3 sets - 10 reps - Standing March with Counter Support  - 1 x daily - 7 x weekly - 3 sets - 10 reps - Standing Hip Abduction with Counter Support  - 1 x daily - 7 x weekly - 3 sets - 10 reps - Standing Hip Extension with Counter Support  - 1 x daily - 7 x weekly - 3 sets - 10 reps - Narrow Stance with Counter Support  - 1 x daily - 7 x weekly - 3 sets - 10 reps - Standing Tandem Balance with Counter Support  - 1 x daily - 7 x weekly - 3 sets - 10 reps - Supine Lower Trunk Rotation  - 1 x daily - 7 x weekly - 1-2 sets - 10 reps - 5 seconds  hold - Supine Single Knee to Chest Stretch  - 1 x daily - 7 x weekly - 1-2 sets - 10 reps - 5 seconds  hold - Supine Figure 4 Piriformis Stretch  - 1 x daily - 7 x weekly - 1-2 sets  - 6 reps - 30 seconds  hold - Seated Thoracic Flexion and Extension  - 1 x daily - 7 x weekly - 1-2 sets - 10 reps   ASSESSMENT:  CLINICAL IMPRESSION:   PN #2- patient continues to have back pain but she has made great progress in terms of functional strength, balance, and overall mobility. She does however remain a bit of a high fall risk and also demonstrates impaired lumbar ROM as well as impaired functional activity tolerance. Feel that she would benefit from skilled PT services in order to continue fine tuning all remaining impairments/working toward goals yet unmet, also to establish appropriate long term independent exercise programming prior to possible DC at end of current cert/scheduled visits.      OBJECTIVE IMPAIRMENTS: Abnormal gait, decreased activity tolerance, decreased balance, decreased knowledge of use of DME, decreased mobility, difficulty walking, decreased ROM, decreased strength, postural dysfunction, obesity, and pain.   ACTIVITY LIMITATIONS: carrying, lifting, bending, sitting, standing, squatting, stairs, and transfers  PARTICIPATION LIMITATIONS: meal prep, cleaning, laundry, driving, shopping, and community activity  PERSONAL FACTORS: Age and 1-2 comorbidities: see PmHx are also affecting patient's functional outcome.   REHAB POTENTIAL: Good  CLINICAL DECISION MAKING: Evolving/moderate complexity  EVALUATION COMPLEXITY: Moderate   GOALS: Goals reviewed with patient? Yes  SHORT TERM GOALS: Target date: 12/14/23 (delay in beginning therapy)  Pt will tolerate full aquatic sessions consistently without increase in pain and with improving function to demonstrate good toleration and effectiveness of intervention.  Baseline: Goal status: Met 12/29/23  2.  Pt will tolerate stair climbing in and out of pool using approp pattern ascending and descending 6 steps with use of handrail  Baseline:  Goal status: Met 12/29/23  3.  Pt will perform 10 STS from water   bench onto water  step unsupported without LOB Baseline:  Goal status:  Met 12/29/23  4.  Pt and cg will consider grab bars for commode  Baseline:  Goal status: deferred as pt reports she is rising off of a raised commode without difficulty 12/29/23  5.  Pt will be indep and compliant  with initial land based HEP for improving strength and safety at home Baseline:  Goal status: Met 12/29/23    LONG TERM GOALS: Target date: 03/19/23  Pt to improve on ODI by 13% to demonstrate statistically significant Improvement in function. (MCID 13-15%) Baseline: 23/50=46% Goal status: ONGOING 02/08/24   2.  Pt will improve strength in bil hip flex and right knee ext by 1 grade to demonstrate improved overall physical function Baseline: see chart Goal status:  MET 02/08/24  3.  Pt will improve on Berg balance test to >/= 35/56 to demonstrate a decrease in fall risk. (MCD 7) Baseline:27/56 (medium fall risk) ; 34/56 Goal status: MET 01/25/24 see above   4.  Pt will improve on 30s STS test  from standard chair from 3 to 6 to demonstrate improving functional lower extremity strength, transitional movements, and balance. (MDC = 4.2sec)  Baseline: 3 Goal status: MET 02/08/24   5.  Pt will report decrease in pain by at least 50% for improved toleration to activity/quality of life and to demonstrate improved management of pain. Baseline: see chart Goal status: ONGOING 02/08/24   6.  Pt will be indep with final HEP's (land and aquatic as appropriate) for continued management of condition Baseline: see chart Goal status: ONGOING 02/08/24   PLAN:  PT FREQUENCY: 2x/week  PT DURATION: 10 w(extended out due to holiday schedule) alternate land/aquatics  PLANNED INTERVENTIONS: 97164- PT Re-evaluation, 97750- Physical Performance Testing, 97110-Therapeutic exercises, 97530- Therapeutic activity, 97112- Neuromuscular re-education, 97535- Self Care, [id number]- Manual therapy, [id number]- Gait training, [phone]- Aquatic  Therapy, [phone]- Ionotophoresis 4mg /ml Dexamethasone , 79439 (1-2 muscles), 20561 (3+ muscles)- Dry Needling, Patient/Family education, Balance training, Stair training, Taping, Joint mobilization, DME instructions, Cryotherapy, and Moist heat.  PLAN FOR NEXT SESSION: aquatic: core and le strengthening, transfers, stair climbing, balance retraining  Land:  continue to progress balance and glute/quad strength, continue with more dynamic surfaces/activities, continue to work on back spasm/pain if its still present   Josette Rough, PT, DPT 02/08/2024 12:58 PM

## 2024-02-09 ENCOUNTER — Other Ambulatory Visit: Payer: Self-pay

## 2024-02-09 ENCOUNTER — Other Ambulatory Visit (HOSPITAL_COMMUNITY): Payer: Self-pay

## 2024-02-09 NOTE — Progress Notes (Signed)
 Specialty Pharmacy Refill Coordination Note  [name] is a 83 y.o. female contacted today regarding refills of specialty medication(s) Abaloparatide  (Tymlos )   Patient requested Delivery   Delivery date: 02/12/24   Verified address: [address] [place] [address]   Medication will be filled on: 02/11/24

## 2024-02-11 ENCOUNTER — Other Ambulatory Visit: Payer: Self-pay

## 2024-02-12 ENCOUNTER — Encounter (HOSPITAL_BASED_OUTPATIENT_CLINIC_OR_DEPARTMENT_OTHER): Payer: Self-pay | Admitting: Physical Therapy

## 2024-02-12 ENCOUNTER — Ambulatory Visit (HOSPITAL_BASED_OUTPATIENT_CLINIC_OR_DEPARTMENT_OTHER): Payer: Self-pay | Admitting: Physical Therapy

## 2024-02-12 DIAGNOSIS — R2681 Unsteadiness on feet: Secondary | ICD-10-CM

## 2024-02-12 DIAGNOSIS — M5459 Other low back pain: Secondary | ICD-10-CM | POA: Diagnosis not present

## 2024-02-12 DIAGNOSIS — M6281 Muscle weakness (generalized): Secondary | ICD-10-CM

## 2024-02-12 NOTE — Therapy (Signed)
 " OUTPATIENT PHYSICAL THERAPY THORACOLUMBAR TREATMENT Patient Name: [name] MRN: [id number] DOB:[birth year], 83 y.o., female Today's Date: 02/12/2024  END OF SESSION:  PT End of Session - 02/12/24 1109     Visit Number 20    Date for Recertification  03/18/24    Authorization Type aetna Mcr    Progress Note Due on Visit 29    PT Start Time 1100    PT Stop Time 1139    PT Time Calculation (min) 39 min    Activity Tolerance Patient tolerated treatment well    Behavior During Therapy WFL for tasks assessed/performed                  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   Hyperlipidemia    Impaired hearing    left ear, wears hearing aids   Insomnia    circadian rhythm component   Insomnia    Migraine 11/1986   Spillman   Nuclear sclerosis    OSA on CPAP 03/2005   uses cpap setting of 10   Osteoarthritis 2007   Deveschwar   Pain last week   left under breast pain    Plantar fasciitis    Rheumatic fever    Scarlet fever as child   Sleep apnea    Swallowing difficulty    Thyroid  disorder    Vitamin D  deficiency    Vitreous degeneration of right eye    Past Surgical History:  Procedure Laterality Date   BREAST BIOPSY Left 6/00   [place]   BREAST EXCISIONAL BIOPSY Left 1998   DE QUERVAIN'S RELEASE Right 10/97   Sypher   left breast biopsy     ROOT CANAL  02/11/2012   TONSILLECTOMY  age 9   TONSILLECTOMY AND ADENOIDECTOMY  1952   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   TOTAL KNEE ARTHROPLASTY Right 11/08/2012   Procedure: RIGHT TOTAL KNEE ARTHROPLASTY;  Surgeon: Dempsey LULLA Moan, MD;  Location: [facility];  Service: Orthopedics;  Laterality: Right;   TUBAL LIGATION     Patient Active Problem List   Diagnosis  Date Noted   Venous stasis of lower extremity 07/28/2023   Great toe pain, right 06/16/2023   Acute pain of right knee 05/28/2023   Closed compression fracture of body of first lumbar vertebra (HCC) 05/28/2023   Bilateral lower extremity edema 02/03/2022   Fluid retention 12/17/2021   Low back pain 10/03/2021   Atherosclerotic heart disease of native coronary artery without angina pectoris 05/27/2021   Diastolic dysfunction 05/27/2021   Neck pain 05/27/2021   Migraine 04/23/2021   Generalized obesity 04/23/2021   Depression 03/18/2021   Lumbar spine pain 08/30/2020   Flatulence, eructation and gas pain 05/07/2020   Change in bowel habit 05/07/2020   Gout 06/22/2019   Post viral syndrome 06/15/2019   Cervicalgia 06/15/2019   Chronic tension-type headache, intractable 06/15/2019   Fall 04/21/2018   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   Diverticulosis 12/23/2017   History of adenomatous polyp of colon 12/23/2017   Recurrent falls 12/14/2017   Degenerative disc disease, cervical 12/14/2017   Chronic constipation 12/14/2017   Floaters in visual field, bilateral 08/10/2017   Head  injury consultation 08/10/2017   Arthritis of carpometacarpal [facility]) joint of right thumb 05/14/2017   Therapeutic opioid-induced constipation (OIC) 02/05/2017   BMI 29.0-29.9,adult 02/05/2017   Dry mouth 02/05/2017   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/08/2017   Intolerance of continuous positive airway pressure (CPAP) ventilation 01/08/2017   Benign neoplasm of colon 11/11/2016   Other long term (current) drug therapy 11/11/2016   History of total knee arthroplasty, 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   Cough 03/06/2016   At risk for injury related to fall 02/14/2016   Primary osteoarthritis of both hands 02/04/2016   Osteopenia of multiple sites 02/04/2016   Metatarsalgia of both feet 02/04/2016   Migraines 02/04/2016   Other fatigue 02/01/2016   Melena 08/20/2015   Acute recurrent maxillary sinusitis  08/20/2015   Slow transit constipation 08/20/2015   Vitamin D  deficiency 08/20/2015   Thyroid  activity decreased 08/20/2015   Cephalalgia 08/20/2015   Preop cardiovascular exam 07/20/2015   Subacute ethmoidal sinusitis 12/11/2014   Chronic infection of sinus 07/12/2014   Nasal septal ulcer 05/25/2014   DJD (degenerative joint disease), cervical 03/22/2014   Right hand pain 03/22/2014   Deflected nasal septum 01/30/2014   Hypertrophy of nasal turbinates 01/30/2014   UARS (upper airway resistance syndrome) 10/04/2013   Insomnia secondary to depression with anxiety 10/04/2013   Exertional dyspnea 08/20/2013   History of rheumatic fever 08/20/2013   Hypomania (mild) single episode or unspecified 08/09/2013   OSA on CPAP 04/06/2013   Postoperative anemia due to acute blood loss 11/09/2012   OA (osteoarthritis) of knee 11/08/2012   History of giardia infection 07/15/2012   Atrophic vaginitis 07/15/2012   Vitreous degeneration of right eye    Nuclear sclerosis    Right shoulder pain 06/18/2011   Foot pain, left 06/18/2011   Hyperlipidemia 03/24/2011   Disorder of bone 02/12/2009   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   Migraine, unspecified, not intractable, without status migrainosus 02/12/2009    PCP: Garnette Ore MD  REFERRING PROVIDER: Helene Haddock MD  REFERRING DIAG:  [phone] (ICD-10-CM) - Acute pain of right knee  S32.010A (ICD-10-CM) - Closed compression fracture of body of L1 vertebra (HCC)    Rationale for Evaluation and Treatment: Rehabilitation  THERAPY DIAG:  Other low back pain  Muscle weakness (generalized)  Unsteadiness on feet  ONSET DATE: March 2025  SUBJECTIVE:                                                                                                                                                                                           SUBJECTIVE STATEMENT:  My back was really hurting last week but it  feels much better now. Would give  myself a 65/100 right now, its still hard for me to lift my feet and get dressed in standing. It feels like my feet won't go where I want them to go.    [name] in June. Have had  2 compression fx.    2 falls in one day early march 1st compression fx then again in June sustaining a second.  Has a membership here at [facility]. I have been Walking in pool 4 x week for 45 mins to 1 hr. But I still hurt. I want to work on my balance and stand up from a chair without using my arms.  Standing up from a chair my right knee and back hurts. Using rollator when walking back to pool but not any other time  PERTINENT HISTORY:  Evaluate and treat for L1 compression fx and right knee pain. Include aquatic therapy in treatment plan.  Chronic fatigue; fibromyalgia  PAIN:  Are you having pain? yes: NPRS scale:5/10 Pain location: R side low back Pain description: very tight, sore to touch   Aggravating factors: not sure Relieving factors: rest. Voltaren  and heat   PRECAUTIONS: Fall  RED FLAGS: None   WEIGHT BEARING RESTRICTIONS: No  FALLS:  Has patient fallen in last 6 months? Yes. Number of falls 1 fall backwards after putting my foot on a chair to toe my shoe.  LIVING ENVIRONMENT: Lives with: lives with their spouse Lives in: House/apartment Has following equipment at home: Single point cane, Environmental Consultant - 2 wheeled, Environmental Consultant - 4 wheeled, and Grab bars  OCCUPATION: retired financial controller  PLOF: Independent  PATIENT GOALS: Improve balance, get in and out of chairs, car and bed better  NEXT MD VISIT: as needed  OBJECTIVE:  Note: Objective measures were completed at Evaluation unless otherwise noted.  DIAGNOSTIC FINDINGS:  7/25 CT Lumbar IMPRESSION: 1. Subacute compression deformities at T12 and L1 with loss of height of 10% at each level. Sclerotic change consistent with ongoing healing process. I cannot state by CT if there is complete healing. No retropulsed bone. 2. L3-4: Mild disc  bulge and facet osteoarthritis. No compressive stenosis. 3. L4-5: Mild disc bulge. Bilateral facet degeneration and hypertrophy. 1-2 mm of degenerative anterolisthesis. Mild narrowing of both lateral recesses, not grossly compressive. 4. L5-S1: Endplate osteophytes and bulging of the disc. Bilateral facet osteoarthritis worse on the right. No compressive stenosis.  05/29/23 Xray R knee IMPRESSION: 1. 9.3 x 3.9 x 3.7 cm heterogenous layering hypodense fluid collection most compatible with a hematoma extending along the superficial fascia of the right posterolateral thigh, overlying and compressing the biceps femoris musculature, which could represent a soft tissue degloving injury, such as a Morel-Lavallee lesion. This collection appears to continue below the level of the knee into the lateral gastrocnemius muscle, concerning for intramuscular hematoma. 2. Intact right total knee arthroplasty. No acute osseous abnormality. 3. Small knee joint effusion.  PATIENT SURVEYS:  ODI: 23/50=46%                       ODI 20/50 = 40% COGNITION: Overall cognitive status: Within functional limits for tasks assessed     SENSATION: WFL  MUSCLE LENGTH: Hamstrings: wfl   POSTURE: rounded shoulders, forward head, decreased thoracic kyphosis, and left pelvic obliquity  PALPATION: Right patella crepitus  LUMBAR ROM:   AROM eval 12/29/23 02/08/24  Flexion full    Extension     Right lateral flexion 90% limited  P! 50% limited P! 40% limited P!   Left lateral flexion 90% limited P! 50% limited P! 75% limited P!  Right rotation     Left rotation      (Blank rows = not tested)  LOWER EXTREMITY ROM:     Active  Right eval Left eval  Hip flexion    Hip extension    Hip abduction    Hip adduction    Hip internal rotation    Hip external rotation    Knee flexion 110 128  Knee extension 0 0  Ankle dorsiflexion    Ankle plantarflexion    Ankle inversion    Ankle eversion     (Blank  rows = not tested)  LOWER EXTREMITY MMT:    MMT Right eval Left eval R / L 12/29/23 R 01/25/24 L 01/25/24 R 02/08/24 L 02/08/24  Hip flexion 3 3 3+ / 3+ 3 3+ 4 4  Hip extension         Hip abduction 4+ 4+ 5- / 5- 4- sidelying  4+ sidelying  4 4  Hip abd 4- 4- 5 /5      Hip internal rotation         Hip external rotation         Knee flexion 4 5 5 5 5  4+ 4+  Knee extension 3+ 4 4 4+ 4+ 4+ 4+  Ankle dorsiflexion         Ankle plantarflexion         Ankle inversion         Ankle eversion          (Blank rows = not tested)  LUMBAR SPECIAL TESTS:  Slump test: Negative  FUNCTIONAL TESTS:  30 seconds chair stand test Timed up and go (TUG): 21.24   02/08/24 TUG 13.5 seconds no device  30 second chair stand test x9 reps using UEs on chair       Item Test date: 11/05/23 Date: 12/29/23 Date: 01/25/24  Sitting to standing 2. able to stand using hands after several tries 3. able to stand independently using hands  4. able to stand safely for 2 minutes  4. able to sit safely and securely for 2 minutes   3. controls descent by using hands  3. able to transfer safely with definite need of hands  4. able to stand 10 seconds safely  3. able to place feet together independently and stand 1 minute with supervision  4. can reach forward confidently 25 cm (10 inches)    2. unable to pick up but reaches 2-5 cm (1-2 inches) from slipper and keeps balance independently  3. looks behind one side only, other side shows less weight shift    1. needs close supervision or verbal cuing  0. needs assistance to keep from falling/unable to try    0. loses balance while stepping or standing   0. unable to try of needs assist to prevent fall     Insert SmartPhrase OPRCBERGREEVAL  2. Standing unsupported 4. able to stand safely for 2 minutes    3. Sitting with back /unsupported, feet supported 4. able to sit safely and securely for 2 minutes    4. Standing to sitting 2. uses back of legs  against chair to control descent    5. Pivot transfer  3. able to transfer safely with definite need of hands    6. Standing unsupported with eyes closed 2. able to stand 3 seconds  7. Standing unsupported with feet together 1. needs help to attain position but able to stand 15 seconds feet together    8. Reaching forward with outstretched arms while standing 2. can reach forward 5 cm (2 inches)    9. Pick up object from the floor from standing 3. able to pick up slipper but needs supervision    10. Turning to look behind over left and right shoulders while standing 3. looks behind one side only, other side shows less weight shift    11. Turn 360 degrees 1. needs close supervision or verbal cuing    12. Place alternate foot on step or stool while standing unsupported 0. needs assistance to keep from falling/unable to try    13. Standing unsupported one foot in front 0. loses balance while stepping or standing    14. Standing on one leg 0. unable to try of needs assist to prevent fall      Total Score 27/56 Total Score:   34/56 Total Score: 43/56       01/25/24 0001  Standardized Balance Assessment  Standardized Balance Assessment Berg Balance Test  Berg Balance Test  Sit to Stand 4  Standing Unsupported 4  Sitting with Back Unsupported but Feet Supported on Floor or Stool 4  Stand to Sit 4  Transfers 4  Standing Unsupported with Eyes Closed 3  Standing Unsupported with Feet Together 3  From Standing, Reach Forward with Outstretched Arm 3  From Standing Position, Pick up Object from Floor 3  From Standing Position, Turn to Look Behind Over each Shoulder 3  Turn 360 Degrees 2  Standing Unsupported, Alternately Place Feet on Step/Stool 3  Standing Unsupported, One Foot in Front 0  Standing on One Leg 3  Total Score 43        GAIT: Distance walked: 400 ft Assistive device utilized: rollator Level of assistance: Complete Independence Comments: quickened short step length,  reduced hip/knee flex, little arm swing  TREATMENT  [facility] Adult PT Treatment:                                              02/12/24 Pt seen for aquatic therapy today.  Treatment took place in water  3.5-4.75 ft in depth at the [facility] pool. Temp of water  was 91.  Pt entered/exited the pool via stairs with bil rail.  -walking forward and backward, unsupported, multiple laps, cues for even step length and vertical trunk -side stepping with ue horizontal add/abd with rainbow hand floats -> abdct/add with rainbow hand floats - suitcase carry with single rainbow hand float, walking forward/backward  -Bow and Arrow (difficulty coordinating). Focused on weight shift back/forth. - forward step with single row with light resistance bell, and return to neutral x 8 each side - L stretch with UE on wall x 3 reps of 10-15 sec - return to walking forward/ backward  -SLS in 4+ ft with shoulder add/abdct with short hollow noodles x 5 eac h(increased back pain) - repeated L stretch at wall - tandem gait forward/ backward, hands on top of water  (challenge) - STS on 3rd step x 3 reps with SBA (improved form with repetition)  02/08/24  Nustep L6x8 minutes seat 10 all four extremities   ODI, MMT, TUG, 30 second chair stand test, lumbar ROM, goals  Tandem stance blue foam 6x30 seconds alternating SLS x15  seconds B alternating    02/05/24 Pt seen for aquatic therapy today.  Treatment took place in water  3.5-4.75 ft in depth at the [facility] pool. Temp of water  was 91.  Pt entered/exited the pool via stairs with bil rail.  -walking forward and back -L stretch->hip hiking -side stepping with ue horizontal add/abd -wide stance ue horizontal add/abd -Bow and Arrow (difficulty coordinating). Focused on weight shift back/forth. -STS from water  bench onto step unsupported 2x 10.  VC and demonstration for execution. Slowed descent.  Good execution no LOB -figure 4 stretch at steps (Not  tolerated well) -open book R/L x 5    02/01/24  Nustep L6x8 minutes seat 10 all four extremities   Lumbar rotation stretch 5x5 seconds B  SKTC 5x5 seconds B Figure 4 stretch B 2x30 seconds Thoracic flexion/extension AROM x10   Tandem stance blue foam 6x30 seconds alternating  Sidesteps on blue foam pad x3 laps      01/25/24  Nustep L6x8 minutes seat 10 all four extremities  MMT, Berg, goals, education on progress with PT  Tandem stance blue foam pad 6x30 seconds alternating Standing marches/tapping top of 6 inch box blue foam pad x20  Forward and lateral steps over hurdles at bar x2 laps Lateral steps over hurdles x1 lap at bar, got light headed- BP 133/72 HR 76, dizziness resolved before we checked SpO2     01/20/24 Pt seen for aquatic therapy today.  Treatment took place in water  3.5-4.75 ft in depth at the [facility] pool. Temp of water  was 91.  Pt entered/exited the pool via stairs with bil rail.  -walking forward and back -wide stance ue horizontal add/abd -Bow and Arrow (difficulty coordinating). Focused on weight shift back/forth. -alternating ue row wide stance -open book R/L x 5 - wall push up/off for reactive balance x 10.  Cues for stronger push to activate balance reaction in ankles -weight shift forward->back; back->forward x 5 ue support yellow hb -ue support wall stepping over opposite foot R/L->grapevine. Ue support HB along the length of the wall for extra support -walking forward and back between exercises fro recovery  01/15/24 Nustep lvl 5, 7 min Step up onto forward 6 step, bilat  Step up laterally onto 6 bilat  Stepping over hurdles fwd and lateral with focus on big steps Seated high marches SL stance on, bilat LE with finger tip hold, 30 sec hold Tandem stance, bilat with finger tip hold, 30 sec hold 2 laps around clinic with SPC with gait belt to assess gait mechanics.    11/13  There-ex:  Lumbar ball stretch x 5 sec  hold  Lateral ball stretch 5x 5 sec hold  Neuro re-ed:  LAQ 2x10 each leg  Hip abduction 2x10 red  Balance:  Narrow base of support EO and EC 2x30 seconds each   Tandem stance EO 2x30 sec hold    Date: 01/05/24 Pt seen for aquatic therapy today.  Treatment took place in water  3.5-4.75 ft in depth at the [facility] pool. Temp of water  was 91.  Pt entered/exited the pool via stairs with bil rail.  -walking forward and back -Stair negotiation alternating steps ue support intermittently on handrails (iincreasingly as decreased submersion) -step up leading R/L 2 x 5. VC for reduced UE support - tandem gait forward/ backward ->with rainbow hand floats (good balance challenge) - alternating toe taps to 1st step x 10, without UE support->2nd step -L stretch-> hip hiking - wall push up/off for reactive  balance x 10 - TrA set with hollow long noodle in standard stance x 10, staggered stance x 8 (improving balance and control good challenge)  - suitcase carry with bil rainbow hand float, marching forward / backward -walking forward and back between exercises fro recovery  01/01/24 STM to glutes, lumbar paraspinals  Standing marches x20 Standing hip abduction x10 Gait training in hallway  Gait training with hurdles and blue airex pad      PATIENT EDUCATION:  Education details: intro to aquatic therapy   Person educated: Patient Education method: Explanation, demonstration Education comprehension: verbalized understanding  HOME EXERCISE PROGRAM:  Access Code: [email or web address] URL: [email or web address] Date: 02/01/2024 Prepared by: Josette Rough  Exercises - Theracane Over Shoulder  - 1 x daily - 7 x weekly - 3 sets - 10 reps - Seated Bilateral Shoulder Flexion Towel Slide at Table Top  - 1 x daily - 7 x weekly - 3 sets - 10 reps - Sit to Stand with Arms Crossed  - 2 x daily - 7 x weekly - 2 sets - 10 reps - Seated Hip Abduction with Resistance  - 1 x daily -  7 x weekly - 3 sets - 10 reps - Seated Long Arc Quad  - 1 x daily - 7 x weekly - 3 sets - 10 reps - Standing March with Counter Support  - 1 x daily - 7 x weekly - 3 sets - 10 reps - Standing Hip Abduction with Counter Support  - 1 x daily - 7 x weekly - 3 sets - 10 reps - Standing Hip Extension with Counter Support  - 1 x daily - 7 x weekly - 3 sets - 10 reps - Narrow Stance with Counter Support  - 1 x daily - 7 x weekly - 3 sets - 10 reps - Standing Tandem Balance with Counter Support  - 1 x daily - 7 x weekly - 3 sets - 10 reps - Supine Lower Trunk Rotation  - 1 x daily - 7 x weekly - 1-2 sets - 10 reps - 5 seconds  hold - Supine Single Knee to Chest Stretch  - 1 x daily - 7 x weekly - 1-2 sets - 10 reps - 5 seconds  hold - Supine Figure 4 Piriformis Stretch  - 1 x daily - 7 x weekly - 1-2 sets - 6 reps - 30 seconds  hold - Seated Thoracic Flexion and Extension  - 1 x daily - 7 x weekly - 1-2 sets - 10 reps   ASSESSMENT:  CLINICAL IMPRESSION: Pt arrived with elevated pain level.  She continues to be challenged with SLS, narrow BOS and weight shifts in water .  No overt LOB.  Some increase in pain in lower back with SLS. She reports greatest relief with L stretch at wall during session.  Pt reported overall good pain relief in back by end of session.  Will continue to progress towards remaining goals.     PN #2- patient continues to have back pain but she has made great progress in terms of functional strength, balance, and overall mobility. She does however remain a bit of a high fall risk and also demonstrates impaired lumbar ROM as well as impaired functional activity tolerance. Feel that she would benefit from skilled PT services in order to continue fine tuning all remaining impairments/working toward goals yet unmet, also to establish appropriate long term independent exercise programming prior to possible DC at end of current cert/scheduled visits.  OBJECTIVE IMPAIRMENTS:  Abnormal gait, decreased activity tolerance, decreased balance, decreased knowledge of use of DME, decreased mobility, difficulty walking, decreased ROM, decreased strength, postural dysfunction, obesity, and pain.   ACTIVITY LIMITATIONS: carrying, lifting, bending, sitting, standing, squatting, stairs, and transfers  PARTICIPATION LIMITATIONS: meal prep, cleaning, laundry, driving, shopping, and community activity  PERSONAL FACTORS: Age and 1-2 comorbidities: see PmHx are also affecting patient's functional outcome.   REHAB POTENTIAL: Good  CLINICAL DECISION MAKING: Evolving/moderate complexity  EVALUATION COMPLEXITY: Moderate   GOALS: Goals reviewed with patient? Yes  SHORT TERM GOALS: Target date: 12/14/23 (delay in beginning therapy)  Pt will tolerate full aquatic sessions consistently without increase in pain and with improving function to demonstrate good toleration and effectiveness of intervention.  Baseline: Goal status: Met 12/29/23  2.  Pt will tolerate stair climbing in and out of pool using approp pattern ascending and descending 6 steps with use of handrail  Baseline:  Goal status: Met 12/29/23  3.  Pt will perform 10 STS from water  bench onto water  step unsupported without LOB Baseline:  Goal status:  Met 12/29/23  4.  Pt and cg will consider grab bars for commode  Baseline:  Goal status: deferred as pt reports she is rising off of a raised commode without difficulty 12/29/23  5.  Pt will be indep and compliant with initial land based HEP for improving strength and safety at home Baseline:  Goal status: Met 12/29/23    LONG TERM GOALS: Target date: 03/19/23  Pt to improve on ODI by 13% to demonstrate statistically significant Improvement in function. (MCID 13-15%) Baseline: 23/50=46% Goal status: ONGOING 02/08/24   2.  Pt will improve strength in bil hip flex and right knee ext by 1 grade to demonstrate improved overall physical function Baseline: see  chart Goal status:  MET 02/08/24  3.  Pt will improve on Berg balance test to >/= 35/56 to demonstrate a decrease in fall risk. (MCD 7) Baseline:27/56 (medium fall risk) ; 34/56 Goal status: MET 01/25/24 see above   4.  Pt will improve on 30s STS test  from standard chair from 3 to 6 to demonstrate improving functional lower extremity strength, transitional movements, and balance. (MDC = 4.2sec)  Baseline: 3 Goal status: MET 02/08/24   5.  Pt will report decrease in pain by at least 50% for improved toleration to activity/quality of life and to demonstrate improved management of pain. Baseline: see chart Goal status: ONGOING 02/08/24   6.  Pt will be indep with final HEP's (land and aquatic as appropriate) for continued management of condition Baseline: see chart Goal status: ONGOING 02/08/24   PLAN:  PT FREQUENCY: 2x/week  PT DURATION: 10 w(extended out due to holiday schedule) alternate land/aquatics  PLANNED INTERVENTIONS: [id number]- PT Re-evaluation, 97750- Physical Performance Testing, 97110-Therapeutic exercises, 97530- Therapeutic activity, 97112- Neuromuscular re-education, 97535- Self Care, [id number]- Manual therapy, [id number]- Gait training, [phone] Aquatic Therapy, [phone]- Ionotophoresis 4mg /ml Dexamethasone , 79439 (1-2 muscles), 20561 (3+ muscles)- Dry Needling, Patient/Family education, Balance training, Stair training, Taping, Joint mobilization, DME instructions, Cryotherapy, and Moist heat.  PLAN FOR NEXT SESSION: aquatic: core and le strengthening, transfers, stair climbing, balance retraining  Land:  continue to progress balance and glute/quad strength, continue with more dynamic surfaces/activities, continue to work on back spasm/pain if its still present    Delon Aquas, PTA 02/12/2024 11:45 AM [facility] [address] [place], [place], [address] Phone: [phone]   Fax:  [phone]  "

## 2024-02-15 ENCOUNTER — Encounter: Payer: Self-pay | Admitting: Neurology

## 2024-02-15 ENCOUNTER — Encounter (HOSPITAL_BASED_OUTPATIENT_CLINIC_OR_DEPARTMENT_OTHER): Payer: Self-pay | Admitting: Physical Therapy

## 2024-02-15 DIAGNOSIS — M5459 Other low back pain: Secondary | ICD-10-CM

## 2024-02-15 DIAGNOSIS — M6281 Muscle weakness (generalized): Secondary | ICD-10-CM

## 2024-02-15 DIAGNOSIS — G8929 Other chronic pain: Secondary | ICD-10-CM

## 2024-02-15 DIAGNOSIS — R2681 Unsteadiness on feet: Secondary | ICD-10-CM

## 2024-02-15 NOTE — Therapy (Signed)
 " OUTPATIENT PHYSICAL THERAPY THORACOLUMBAR TREATMENT Patient Name: [name] MRN: [id number] DOB:1940-09-11, 83 y.o., female Today's Date: 02/15/2024  END OF SESSION:  PT End of Session - 02/15/24 1058     Visit Number 21    Number of Visits 16    Date for Recertification  03/18/24    Authorization Type aetna Mcr    Progress Note Due on Visit 29    PT Start Time 1100    PT Stop Time 1140    PT Time Calculation (min) 40 min    Equipment Utilized During Treatment Gait belt    Activity Tolerance Patient tolerated treatment well    Behavior During Therapy WFL for tasks assessed/performed                   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   Hyperlipidemia    Impaired hearing    left ear, wears hearing aids   Insomnia    circadian rhythm component   Insomnia    Migraine 11/1986   Spillman   Nuclear sclerosis    OSA on CPAP 03/2005   uses cpap setting of 10   Osteoarthritis 2007   Deveschwar   Pain last week   left under breast pain    Plantar fasciitis    Rheumatic fever    Scarlet fever as child   Sleep apnea    Swallowing difficulty    Thyroid  disorder    Vitamin D  deficiency    Vitreous degeneration of right eye    Past Surgical History:  Procedure Laterality Date   BREAST BIOPSY Left 6/00   [place]   BREAST EXCISIONAL BIOPSY Left 1998   DE QUERVAIN'S RELEASE Right 10/97   Sypher   left breast biopsy     ROOT CANAL  02/11/2012   TONSILLECTOMY  age 6   TONSILLECTOMY AND ADENOIDECTOMY  1952   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   TOTAL KNEE ARTHROPLASTY Right 11/08/2012   Procedure: RIGHT TOTAL KNEE ARTHROPLASTY;  Surgeon: Brenda LULLA Moan, MD;  Location: [facility];  Service: Orthopedics;   Laterality: Right;   TUBAL LIGATION     Patient Active Problem List   Diagnosis Date Noted   Venous stasis of lower extremity 07/28/2023   Great toe pain, right 06/16/2023   Acute pain of right knee 05/28/2023   Closed compression fracture of body of first lumbar vertebra (HCC) 05/28/2023   Bilateral lower extremity edema 02/03/2022   Fluid retention 12/17/2021   Low back pain 10/03/2021   Atherosclerotic heart disease of native coronary artery without angina pectoris 05/27/2021   Diastolic dysfunction 05/27/2021   Neck pain 05/27/2021   Migraine 04/23/2021   Generalized obesity 04/23/2021   Depression 03/18/2021   Lumbar spine pain 08/30/2020   Flatulence, eructation and gas pain 05/07/2020   Change in bowel habit 05/07/2020   Gout 06/22/2019   Post viral syndrome 06/15/2019   Cervicalgia 06/15/2019   Chronic tension-type headache, intractable 06/15/2019   Fall 04/21/2018   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   Diverticulosis 12/23/2017   History of adenomatous polyp of colon 12/23/2017   Recurrent falls 12/14/2017   Degenerative disc disease, cervical  12/14/2017   Chronic constipation 12/14/2017   Floaters in visual field, bilateral 08/10/2017   Head injury consultation 08/10/2017   Arthritis of carpometacarpal [facility]) joint of right thumb 05/14/2017   Therapeutic opioid-induced constipation (OIC) 02/05/2017   BMI 29.0-29.9,adult 02/05/2017   Dry mouth 02/05/2017   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/08/2017   Intolerance of continuous positive airway pressure (CPAP) ventilation 01/08/2017   Benign neoplasm of colon 11/11/2016   Other long term (current) drug therapy 11/11/2016   History of total knee arthroplasty, 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   Cough 03/06/2016   At risk for injury related to fall 02/14/2016   Primary osteoarthritis of both hands 02/04/2016   Osteopenia of multiple sites 02/04/2016   Metatarsalgia of both feet 02/04/2016   Migraines 02/04/2016    Other fatigue 02/01/2016   Melena 08/20/2015   Acute recurrent maxillary sinusitis 08/20/2015   Slow transit constipation 08/20/2015   Vitamin D  deficiency 08/20/2015   Thyroid  activity decreased 08/20/2015   Cephalalgia 08/20/2015   Preop cardiovascular exam 07/20/2015   Subacute ethmoidal sinusitis 12/11/2014   Chronic infection of sinus 07/12/2014   Nasal septal ulcer 05/25/2014   DJD (degenerative joint disease), cervical 03/22/2014   Right hand pain 03/22/2014   Deflected nasal septum 01/30/2014   Hypertrophy of nasal turbinates 01/30/2014   UARS (upper airway resistance syndrome) 10/04/2013   Insomnia secondary to depression with anxiety 10/04/2013   Exertional dyspnea 08/20/2013   History of rheumatic fever 08/20/2013   Hypomania (mild) single episode or unspecified 08/09/2013   OSA on CPAP 04/06/2013   Postoperative anemia due to acute blood loss 11/09/2012   OA (osteoarthritis) of knee 11/08/2012   History of giardia infection 07/15/2012   Atrophic vaginitis 07/15/2012   Vitreous degeneration of right eye    Nuclear sclerosis    Right shoulder pain 06/18/2011   Foot pain, left 06/18/2011   Hyperlipidemia 03/24/2011   Disorder of bone 02/12/2009   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 Would give myself a 65/100 right now, its still hard for me to lift my feet and get dressed in standing. It feels like my feet won't go where I want them to go.    EvalBETHA Green in June. Have had  2 compression fx.    2 falls in one day early march 1st compression fx then again in June sustaining a second.  Has a membership here at [facility]. I have been Walking in pool 4 x week for 45 mins to 1 hr. But I still hurt. I want to work on my balance and stand up from a chair without using my arms.  Standing up from a chair my right knee and back hurts. Using rollator when walking back to pool but not any other time  PERTINENT HISTORY:  Evaluate and treat for L1 compression fx and right knee pain. Include aquatic therapy in treatment plan.  Chronic fatigue; fibromyalgia  PAIN:  Are you having pain? yes: NPRS scale:5/10 Pain location: R side low back Pain description: very tight, sore to touch   Aggravating factors: not sure Relieving factors: rest. Voltaren  and heat   PRECAUTIONS: Fall  RED FLAGS: None   WEIGHT BEARING RESTRICTIONS: No  FALLS:  Has patient fallen in last 6 months? Yes. Number of falls 1 fall backwards after putting my foot on a chair to toe my shoe.  LIVING ENVIRONMENT: Lives with: lives with their spouse Lives in: House/apartment Has following equipment at home: Single point cane, Environmental Consultant - 2 wheeled, Environmental Consultant - 4 wheeled, and Grab bars  OCCUPATION: retired financial controller  PLOF: Independent  PATIENT GOALS: Improve balance, get in and out of chairs, car and bed better  NEXT MD VISIT: as needed  OBJECTIVE:  Note: Objective measures were completed at Evaluation unless otherwise noted.  DIAGNOSTIC FINDINGS:  7/25 CT Lumbar IMPRESSION: 1. Subacute compression deformities at T12 and L1 with loss of height of 10% at each level. Sclerotic change consistent  with ongoing healing process. I cannot state by CT if there is complete healing. No retropulsed bone. 2. L3-4: Mild disc bulge and facet osteoarthritis. No compressive stenosis. 3. L4-5: Mild disc bulge. Bilateral facet degeneration and hypertrophy. 1-2 mm of degenerative anterolisthesis. Mild narrowing of both lateral recesses, not grossly compressive. 4. L5-S1: Endplate osteophytes and bulging of the disc. Bilateral facet osteoarthritis worse on the right. No compressive stenosis.  05/29/23 Xray R knee IMPRESSION: 1. 9.3 x 3.9 x 3.7 cm heterogenous layering hypodense fluid collection most compatible with a hematoma extending along the superficial fascia of the right posterolateral thigh, overlying and compressing the biceps femoris musculature, which could represent a soft tissue degloving injury, such as a Morel-Lavallee lesion. This collection appears to continue below the level of the knee into the lateral gastrocnemius muscle, concerning for intramuscular hematoma. 2. Intact right total knee arthroplasty. No acute osseous abnormality. 3. Small knee joint effusion.  PATIENT SURVEYS:  ODI: 23/50=46%                       ODI 20/50 = 40% COGNITION: Overall cognitive status: Within functional limits for tasks assessed     SENSATION: WFL  MUSCLE LENGTH: Hamstrings: wfl   POSTURE: rounded shoulders, forward head, decreased thoracic kyphosis, and left pelvic obliquity  PALPATION: Right patella crepitus  LUMBAR ROM:   AROM eval 12/29/23 02/08/24  Flexion full  Extension     Right lateral flexion 90% limited P! 50% limited P! 40% limited P!   Left lateral flexion 90% limited P! 50% limited P! 75% limited P!  Right rotation     Left rotation      (Blank rows = not tested)  LOWER EXTREMITY ROM:     Active  Right eval Left eval  Hip flexion    Hip extension    Hip abduction    Hip adduction    Hip internal rotation    Hip external rotation    Knee flexion 110  128  Knee extension 0 0  Ankle dorsiflexion    Ankle plantarflexion    Ankle inversion    Ankle eversion     (Blank rows = not tested)  LOWER EXTREMITY MMT:    MMT Right eval Left eval R / L 12/29/23 R 01/25/24 L 01/25/24 R 02/08/24 L 02/08/24  Hip flexion 3 3 3+ / 3+ 3 3+ 4 4  Hip extension         Hip abduction 4+ 4+ 5- / 5- 4- sidelying  4+ sidelying  4 4  Hip abd 4- 4- 5 /5      Hip internal rotation         Hip external rotation         Knee flexion 4 5 5 5 5  4+ 4+  Knee extension 3+ 4 4 4+ 4+ 4+ 4+  Ankle dorsiflexion         Ankle plantarflexion         Ankle inversion         Ankle eversion          (Blank rows = not tested)  LUMBAR SPECIAL TESTS:  Slump test: Negative  FUNCTIONAL TESTS:  30 seconds chair stand test Timed up and go (TUG): 21.24   02/08/24 TUG 13.5 seconds no device  30 second chair stand test x9 reps using UEs on chair       Item Test date: 11/05/23 Date: 12/29/23 Date: 01/25/24  Sitting to standing 2. able to stand using hands after several tries 3. able to stand independently using hands  4. able to stand safely for 2 minutes  4. able to sit safely and securely for 2 minutes   3. controls descent by using hands  3. able to transfer safely with definite need of hands  4. able to stand 10 seconds safely  3. able to place feet together independently and stand 1 minute with supervision  4. can reach forward confidently 25 cm (10 inches)    2. unable to pick up but reaches 2-5 cm (1-2 inches) from slipper and keeps balance independently  3. looks behind one side only, other side shows less weight shift    1. needs close supervision or verbal cuing  0. needs assistance to keep from falling/unable to try    0. loses balance while stepping or standing   0. unable to try of needs assist to prevent fall     Insert SmartPhrase OPRCBERGREEVAL  2. Standing unsupported 4. able to stand safely for 2 minutes    3. Sitting with back  /unsupported, feet supported 4. able to sit safely and securely for 2 minutes    4. Standing to sitting 2. uses back of legs against chair to control descent    5. Pivot transfer  3. able to transfer safely with definite need of hands    6. Standing unsupported with  eyes closed 2. able to stand 3 seconds    7. Standing unsupported with feet together 1. needs help to attain position but able to stand 15 seconds feet together    8. Reaching forward with outstretched arms while standing 2. can reach forward 5 cm (2 inches)    9. Pick up object from the floor from standing 3. able to pick up slipper but needs supervision    10. Turning to look behind over left and right shoulders while standing 3. looks behind one side only, other side shows less weight shift    11. Turn 360 degrees 1. needs close supervision or verbal cuing    12. Place alternate foot on step or stool while standing unsupported 0. needs assistance to keep from falling/unable to try    13. Standing unsupported one foot in front 0. loses balance while stepping or standing    14. Standing on one leg 0. unable to try of needs assist to prevent fall      Total Score 27/56 Total Score:   34/56 Total Score: 43/56       01/25/24 0001  Standardized Balance Assessment  Standardized Balance Assessment Berg Balance Test  Berg Balance Test  Sit to Stand 4  Standing Unsupported 4  Sitting with Back Unsupported but Feet Supported on Floor or Stool 4  Stand to Sit 4  Transfers 4  Standing Unsupported with Eyes Closed 3  Standing Unsupported with Feet Together 3  From Standing, Reach Forward with Outstretched Arm 3  From Standing Position, Pick up Object from Floor 3  From Standing Position, Turn to Look Behind Over each Shoulder 3  Turn 360 Degrees 2  Standing Unsupported, Alternately Place Feet on Step/Stool 3  Standing Unsupported, One Foot in Front 0  Standing on One Leg 3  Total Score 43        GAIT: Distance walked: 400  ft Assistive device utilized: rollator Level of assistance: Complete Independence Comments: quickened short step length, reduced hip/knee flex, little arm swing  TREATMENT  [facility] Adult PT Treatment:    02/15/24:                                           Nustep L6x8 minutes seat 10 all four extremities  Seated LAQ Seated marching  1 lap around clinic with focus on big steps Sit to stands from hi -low table  Toe tap to cone in center to work on hip flexion and ER for putting on pants Stepping over tape on floor in square to help with big steps in lateral/ fwd/ bkwd directions Assessment of coordination on LE Discussion about parkinson type symptoms.    02/12/24 Pt seen for aquatic therapy today.  Treatment took place in water  3.5-4.75 ft in depth at the [facility] pool. Temp of water  was 91.  Pt entered/exited the pool via stairs with bil rail.  -walking forward and backward, unsupported, multiple laps, cues for even step length and vertical trunk -side stepping with ue horizontal add/abd with rainbow hand floats -> abdct/add with rainbow hand floats - suitcase carry with single rainbow hand float, walking forward/backward  -Bow and Arrow (difficulty coordinating). Focused on weight shift back/forth. - forward step with single row with light resistance bell, and return to neutral x 8 each side - L stretch with UE on wall x 3 reps of 10-15  sec - return to walking forward/ backward  -SLS in 4+ ft with shoulder add/abdct with short hollow noodles x 5 eac h(increased back pain) - repeated L stretch at wall - tandem gait forward/ backward, hands on top of water  (challenge) - STS on 3rd step x 3 reps with SBA (improved form with repetition)  02/08/24  Nustep L6x8 minutes seat 10 all four extremities   ODI, MMT, TUG, 30 second chair stand test, lumbar ROM, goals  Tandem stance blue foam 6x30 seconds alternating SLS x15 seconds B alternating    02/05/24 Pt seen for  aquatic therapy today.  Treatment took place in water  3.5-4.75 ft in depth at the [facility] pool. Temp of water  was 91.  Pt entered/exited the pool via stairs with bil rail.  -walking forward and back -L stretch->hip hiking -side stepping with ue horizontal add/abd -wide stance ue horizontal add/abd -Bow and Arrow (difficulty coordinating). Focused on weight shift back/forth. -STS from water  bench onto step unsupported 2x 10.  VC and demonstration for execution. Slowed descent.  Good execution no LOB -figure 4 stretch at steps (Not tolerated well) -open book R/L x 5    02/01/24  Nustep L6x8 minutes seat 10 all four extremities   Lumbar rotation stretch 5x5 seconds B  SKTC 5x5 seconds B Figure 4 stretch B 2x30 seconds Thoracic flexion/extension AROM x10   Tandem stance blue foam 6x30 seconds alternating  Sidesteps on blue foam pad x3 laps      01/25/24  Nustep L6x8 minutes seat 10 all four extremities  MMT, Berg, goals, education on progress with PT  Tandem stance blue foam pad 6x30 seconds alternating Standing marches/tapping top of 6 inch box blue foam pad x20  Forward and lateral steps over hurdles at bar x2 laps Lateral steps over hurdles x1 lap at bar, got light headed- BP 133/72 HR 76, dizziness resolved before we checked SpO2     01/20/24 Pt seen for aquatic therapy today.  Treatment took place in water  3.5-4.75 ft in depth at the [facility] pool. Temp of water  was 91.  Pt entered/exited the pool via stairs with bil rail.  -walking forward and back -wide stance ue horizontal add/abd -Bow and Arrow (difficulty coordinating). Focused on weight shift back/forth. -alternating ue row wide stance -open book R/L x 5 - wall push up/off for reactive balance x 10.  Cues for stronger push to activate balance reaction in ankles -weight shift forward->back; back->forward x 5 ue support yellow hb -ue support wall stepping over opposite foot  R/L->grapevine. Ue support HB along the length of the wall for extra support -walking forward and back between exercises fro recovery  01/15/24 Nustep lvl 5, 7 min Step up onto forward 6 step, bilat  Step up laterally onto 6 bilat  Stepping over hurdles fwd and lateral with focus on big steps Seated high marches SL stance on, bilat LE with finger tip hold, 30 sec hold Tandem stance, bilat with finger tip hold, 30 sec hold 2 laps around clinic with SPC with gait belt to assess gait mechanics.    11/13  There-ex:  Lumbar ball stretch x 5 sec hold  Lateral ball stretch 5x 5 sec hold  Neuro re-ed:  LAQ 2x10 each leg  Hip abduction 2x10 red  Balance:  Narrow base of support EO and EC 2x30 seconds each   Tandem stance EO 2x30 sec hold    Date: 01/05/24 Pt seen for aquatic therapy today.  Treatment took place in  water  3.5-4.75 ft in depth at the [facility] pool. Temp of water  was 91.  Pt entered/exited the pool via stairs with bil rail.  -walking forward and back -Stair negotiation alternating steps ue support intermittently on handrails (iincreasingly as decreased submersion) -step up leading R/L 2 x 5. VC for reduced UE support - tandem gait forward/ backward ->with rainbow hand floats (good balance challenge) - alternating toe taps to 1st step x 10, without UE support->2nd step -L stretch-> hip hiking - wall push up/off for reactive balance x 10 - TrA set with hollow long noodle in standard stance x 10, staggered stance x 8 (improving balance and control good challenge)  - suitcase carry with bil rainbow hand float, marching forward / backward -walking forward and back between exercises fro recovery  01/01/24 STM to glutes, lumbar paraspinals  Standing marches x20 Standing hip abduction x10 Gait training in hallway  Gait training with hurdles and blue airex pad      PATIENT EDUCATION:  Education details: intro to aquatic therapy   Person educated:  Patient Education method: Explanation, demonstration Education comprehension: verbalized understanding  HOME EXERCISE PROGRAM:  Access Code: [email or web address] URL: [email or web address] Date: 02/01/2024 Prepared by: Josette Rough  Exercises - Theracane Over Shoulder  - 1 x daily - 7 x weekly - 3 sets - 10 reps - Seated Bilateral Shoulder Flexion Towel Slide at Table Top  - 1 x daily - 7 x weekly - 3 sets - 10 reps - Sit to Stand with Arms Crossed  - 2 x daily - 7 x weekly - 2 sets - 10 reps - Seated Hip Abduction with Resistance  - 1 x daily - 7 x weekly - 3 sets - 10 reps - Seated Long Arc Quad  - 1 x daily - 7 x weekly - 3 sets - 10 reps - Standing March with Counter Support  - 1 x daily - 7 x weekly - 3 sets - 10 reps - Standing Hip Abduction with Counter Support  - 1 x daily - 7 x weekly - 3 sets - 10 reps - Standing Hip Extension with Counter Support  - 1 x daily - 7 x weekly - 3 sets - 10 reps - Narrow Stance with Counter Support  - 1 x daily - 7 x weekly - 3 sets - 10 reps - Standing Tandem Balance with Counter Support  - 1 x daily - 7 x weekly - 3 sets - 10 reps - Supine Lower Trunk Rotation  - 1 x daily - 7 x weekly - 1-2 sets - 10 reps - 5 seconds  hold - Supine Single Knee to Chest Stretch  - 1 x daily - 7 x weekly - 1-2 sets - 10 reps - 5 seconds  hold - Supine Figure 4 Piriformis Stretch  - 1 x daily - 7 x weekly - 1-2 sets - 6 reps - 30 seconds  hold - Seated Thoracic Flexion and Extension  - 1 x daily - 7 x weekly - 1-2 sets - 10 reps   ASSESSMENT:  CLINICAL IMPRESSION: Pt arrives to PT with continued concerns about her balance. She is having difficulty with getting dressed. She also reports shuffling with her gait. We discussed further about other concerning symptoms along the lines of parkinson's. She reports a recent change in her writing, difficulty with transitions and getting around furniture at home. Pt demonstrates difficulty with LE coordination, but  functional ROM with hip flexion. After discussion she has  requested I send a referral the neurologist that she is currently seeing to rule out any parkinson's or other neurological issues. She continues to be challenged with SLS, or stepping type motions in a diagonal fashion.  No LOB. Will continue to progress towards remaining goals.     PN #2- patient continues to have back pain but she has made great progress in terms of functional strength, balance, and overall mobility. She does however remain a bit of a high fall risk and also demonstrates impaired lumbar ROM as well as impaired functional activity tolerance. Feel that she would benefit from skilled PT services in order to continue fine tuning all remaining impairments/working toward goals yet unmet, also to establish appropriate long term independent exercise programming prior to possible DC at end of current cert/scheduled visits.      OBJECTIVE IMPAIRMENTS: Abnormal gait, decreased activity tolerance, decreased balance, decreased knowledge of use of DME, decreased mobility, difficulty walking, decreased ROM, decreased strength, postural dysfunction, obesity, and pain.   ACTIVITY LIMITATIONS: carrying, lifting, bending, sitting, standing, squatting, stairs, and transfers  PARTICIPATION LIMITATIONS: meal prep, cleaning, laundry, driving, shopping, and community activity  PERSONAL FACTORS: Age and 1-2 comorbidities: see PmHx are also affecting patient's functional outcome.   REHAB POTENTIAL: Good  CLINICAL DECISION MAKING: Evolving/moderate complexity  EVALUATION COMPLEXITY: Moderate   GOALS: Goals reviewed with patient? Yes  SHORT TERM GOALS: Target date: 12/14/23 (delay in beginning therapy)  Pt will tolerate full aquatic sessions consistently without increase in pain and with improving function to demonstrate good toleration and effectiveness of intervention.  Baseline: Goal status: Met 12/29/23  2.  Pt will tolerate stair  climbing in and out of pool using approp pattern ascending and descending 6 steps with use of handrail  Baseline:  Goal status: Met 12/29/23  3.  Pt will perform 10 STS from water  bench onto water  step unsupported without LOB Baseline:  Goal status:  Met 12/29/23  4.  Pt and cg will consider grab bars for commode  Baseline:  Goal status: deferred as pt reports she is rising off of a raised commode without difficulty 12/29/23  5.  Pt will be indep and compliant with initial land based HEP for improving strength and safety at home Baseline:  Goal status: Met 12/29/23    LONG TERM GOALS: Target date: 03/19/23  Pt to improve on ODI by 13% to demonstrate statistically significant Improvement in function. (MCID 13-15%) Baseline: 23/50=46% Goal status: ONGOING 02/08/24   2.  Pt will improve strength in bil hip flex and right knee ext by 1 grade to demonstrate improved overall physical function Baseline: see chart Goal status:  MET 02/08/24  3.  Pt will improve on Berg balance test to >/= 35/56 to demonstrate a decrease in fall risk. (MCD 7) Baseline:27/56 (medium fall risk) ; 34/56 Goal status: MET 01/25/24 see above   4.  Pt will improve on 30s STS test  from standard chair from 3 to 6 to demonstrate improving functional lower extremity strength, transitional movements, and balance. (MDC = 4.2sec)  Baseline: 3 Goal status: MET 02/08/24   5.  Pt will report decrease in pain by at least 50% for improved toleration to activity/quality of life and to demonstrate improved management of pain. Baseline: see chart Goal status: ONGOING 02/08/24   6.  Pt will be indep with final HEP's (land and aquatic as appropriate) for continued management of condition Baseline: see chart Goal status: ONGOING 02/08/24   PLAN:  PT FREQUENCY: 2x/week  PT DURATION: 10 w(extended out due to holiday schedule) alternate land/aquatics  PLANNED INTERVENTIONS: 97164- PT Re-evaluation, 97750- Physical  Performance Testing, 97110-Therapeutic exercises, 97530- Therapeutic activity, 97112- Neuromuscular re-education, 97535- Self Care, 02859- Manual therapy, [phone]- Gait training, [phone]- Aquatic Therapy, [phone]- Ionotophoresis 4mg /ml Dexamethasone , 79439 (1-2 muscles), 20561 (3+ muscles)- Dry Needling, Patient/Family education, Balance training, Stair training, Taping, Joint mobilization, DME instructions, Cryotherapy, and Moist heat.  PLAN FOR NEXT SESSION: aquatic: core and le strengthening, transfers, stair climbing, balance retraining  Land:  continue to progress balance and glute/quad strength, continue with more dynamic surfaces/activities, continue to work on back spasm/pain if its still present    Rojean Batten PT, DPT 02/15/2024  12:56 PM    "

## 2024-02-19 ENCOUNTER — Encounter (HOSPITAL_BASED_OUTPATIENT_CLINIC_OR_DEPARTMENT_OTHER): Payer: Self-pay | Admitting: Physical Therapy

## 2024-02-19 ENCOUNTER — Ambulatory Visit (HOSPITAL_BASED_OUTPATIENT_CLINIC_OR_DEPARTMENT_OTHER): Payer: Self-pay | Admitting: Physical Therapy

## 2024-02-19 DIAGNOSIS — M5459 Other low back pain: Secondary | ICD-10-CM | POA: Diagnosis not present

## 2024-02-19 DIAGNOSIS — R2681 Unsteadiness on feet: Secondary | ICD-10-CM

## 2024-02-19 DIAGNOSIS — M6281 Muscle weakness (generalized): Secondary | ICD-10-CM

## 2024-02-19 NOTE — Therapy (Signed)
 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   Hyperlipidemia    Impaired hearing    left ear, wears hearing aids   Insomnia    circadian rhythm component   Insomnia    Migraine 11/1986   Spillman   Nuclear sclerosis    OSA on CPAP 03/2005   uses cpap setting of 10   Osteoarthritis 2007   Deveschwar   Pain last week   left under breast pain    Plantar fasciitis    Rheumatic fever    Scarlet fever as child   Sleep apnea    Swallowing difficulty    Thyroid  disorder    Vitamin D  deficiency    Vitreous degeneration of right eye    Past Surgical History:  Procedure Laterality Date   BREAST BIOPSY Left 6/00   [place]   BREAST EXCISIONAL BIOPSY Left 1998   DE QUERVAIN'S RELEASE Right 10/97   Sypher   left breast biopsy     ROOT CANAL  02/11/2012   TONSILLECTOMY  age 70   TONSILLECTOMY AND ADENOIDECTOMY  1952   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   TOTAL KNEE ARTHROPLASTY Right 11/08/2012   Procedure: RIGHT TOTAL KNEE ARTHROPLASTY;  Surgeon: Dempsey LULLA Moan, MD;  Location: [facility];  Service: Orthopedics;  Laterality: Right;   TUBAL LIGATION     Patient Active Problem List   Diagnosis Date Noted   Venous stasis of lower extremity  07/28/2023   Great toe pain, right 06/16/2023   Acute pain of right knee 05/28/2023   Closed compression fracture of body of first lumbar vertebra (HCC) 05/28/2023   Bilateral lower extremity edema 02/03/2022   Fluid retention 12/17/2021   Low back pain 10/03/2021   Atherosclerotic heart disease of native coronary artery without angina pectoris 05/27/2021   Diastolic dysfunction 05/27/2021   Neck pain 05/27/2021   Migraine 04/23/2021   Generalized obesity 04/23/2021   Depression 03/18/2021   Lumbar spine pain 08/30/2020   Flatulence, eructation and gas pain 05/07/2020   Change in bowel habit 05/07/2020   Gout 06/22/2019   Post viral syndrome 06/15/2019   Cervicalgia 06/15/2019   Chronic tension-type headache, intractable 06/15/2019   Fall 04/21/2018   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   Diverticulosis 12/23/2017   History of adenomatous polyp of colon 12/23/2017   Recurrent falls 12/14/2017   Degenerative disc disease, cervical 12/14/2017   Chronic constipation 12/14/2017   Floaters in visual field, bilateral 08/10/2017   Head injury consultation 08/10/2017   Arthritis of carpometacarpal [facility]) joint of right thumb 05/14/2017  Therapeutic opioid-induced constipation (OIC) 02/05/2017   BMI 29.0-29.9,adult 02/05/2017   Dry mouth 02/05/2017   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/08/2017   Intolerance of continuous positive airway pressure (CPAP) ventilation 01/08/2017   Benign neoplasm of colon 11/11/2016   Other long term (current) drug therapy 11/11/2016   History of total knee arthroplasty, 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   Cough 03/06/2016   At risk for injury related to fall 02/14/2016   Primary osteoarthritis of both hands 02/04/2016   Osteopenia of multiple sites 02/04/2016   Metatarsalgia of both feet 02/04/2016   Migraines 02/04/2016   Other fatigue 02/01/2016   Melena 08/20/2015   Acute recurrent maxillary sinusitis 08/20/2015   Slow transit constipation  08/20/2015   Vitamin D  deficiency 08/20/2015   Thyroid  activity decreased 08/20/2015   Cephalalgia 08/20/2015   Preop cardiovascular exam 07/20/2015   Subacute ethmoidal sinusitis 12/11/2014   Chronic infection of sinus 07/12/2014   Nasal septal ulcer 05/25/2014   DJD (degenerative joint disease), cervical 03/22/2014   Right hand pain 03/22/2014   Deflected nasal septum 01/30/2014   Hypertrophy of nasal turbinates 01/30/2014   UARS (upper airway resistance syndrome) 10/04/2013   Insomnia secondary to depression with anxiety 10/04/2013   Exertional dyspnea 08/20/2013   History of rheumatic fever 08/20/2013   Hypomania (mild) single episode or unspecified 08/09/2013   OSA on CPAP 04/06/2013   Postoperative anemia due to acute blood loss 11/09/2012   OA (osteoarthritis) of knee 11/08/2012   History of giardia infection 07/15/2012   Atrophic vaginitis 07/15/2012   Vitreous degeneration of right eye    Nuclear sclerosis    Right shoulder pain 06/18/2011   Foot pain, left 06/18/2011   Hyperlipidemia 03/24/2011   Disorder of bone 02/12/2009   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   Migraine, unspecified, not intractable, without status migrainosus 02/12/2009    PCP: Garnette Ore MD  REFERRING PROVIDER: Helene Haddock MD  REFERRING DIAG:  [phone] (ICD-10-CM) - Acute pain of right knee  S32.010A (ICD-10-CM) - Closed compression fracture of body of L1 vertebra (HCC)    Rationale for Evaluation and Treatment: Rehabilitation  THERAPY DIAG:  Other low back pain  Muscle weakness (generalized)  Unsteadiness on feet  ONSET DATE: March 2025  SUBJECTIVE:                                                                                                                                                                                           SUBJECTIVE STATEMENT: Pain relatively low today, hasn't been this low in a long time.    EvalBETHA Rasmussen in June. Have had  2 compression fx.  2 falls in one day early march 1st compression fx then again in June sustaining a second.  Has a membership here at [facility]. I have been Walking in pool 4 x week for 45 mins to 1 hr. But I still hurt. I want to work on my balance and stand up from a chair without using my arms.  Standing up from a chair my right knee and back hurts. Using rollator when walking back to pool but not any other time  PERTINENT HISTORY:  Evaluate and treat for L1 compression fx and right knee pain. Include aquatic therapy in treatment plan.  Chronic fatigue; fibromyalgia  PAIN:  Are you having pain? yes: NPRS scale:3/10 Pain location: R side low back Pain description: very tight, sore to touch   Aggravating factors: not sure Relieving factors: rest. Voltaren  and heat   PRECAUTIONS: Fall  RED FLAGS: None   WEIGHT BEARING RESTRICTIONS: No  FALLS:  Has patient fallen in last 6 months? Yes. Number of falls 1 fall backwards after putting my foot on a chair to toe my shoe.  LIVING ENVIRONMENT: Lives with: lives with their spouse Lives in: House/apartment Has following equipment at home: Single point cane, Environmental Consultant - 2 wheeled, Environmental Consultant - 4 wheeled, and Grab bars  OCCUPATION: retired financial controller  PLOF: Independent  PATIENT GOALS: Improve balance, get in and out of chairs, car and bed better  NEXT MD VISIT: as needed  OBJECTIVE:  Note: Objective measures were completed at Evaluation unless otherwise noted.  DIAGNOSTIC FINDINGS:  7/25 CT Lumbar IMPRESSION: 1. Subacute compression deformities at T12 and L1 with loss of height of 10% at each level. Sclerotic change consistent with ongoing healing process. I cannot state by CT if there is complete healing. No retropulsed bone. 2. L3-4: Mild disc bulge and facet osteoarthritis. No compressive stenosis. 3. L4-5: Mild disc bulge. Bilateral facet degeneration and hypertrophy. 1-2 mm of degenerative anterolisthesis. Mild narrowing of both lateral  recesses, not grossly compressive. 4. L5-S1: Endplate osteophytes and bulging of the disc. Bilateral facet osteoarthritis worse on the right. No compressive stenosis.  05/29/23 Xray R knee IMPRESSION: 1. 9.3 x 3.9 x 3.7 cm heterogenous layering hypodense fluid collection most compatible with a hematoma extending along the superficial fascia of the right posterolateral thigh, overlying and compressing the biceps femoris musculature, which could represent a soft tissue degloving injury, such as a Morel-Lavallee lesion. This collection appears to continue below the level of the knee into the lateral gastrocnemius muscle, concerning for intramuscular hematoma. 2. Intact right total knee arthroplasty. No acute osseous abnormality. 3. Small knee joint effusion.  PATIENT SURVEYS:  ODI: 23/50=46%                       ODI 20/50 = 40% COGNITION: Overall cognitive status: Within functional limits for tasks assessed     SENSATION: WFL  MUSCLE LENGTH: Hamstrings: wfl   POSTURE: rounded shoulders, forward head, decreased thoracic kyphosis, and left pelvic obliquity  PALPATION: Right patella crepitus  LUMBAR ROM:   AROM eval 12/29/23 02/08/24  Flexion full    Extension     Right lateral flexion 90% limited P! 50% limited P! 40% limited P!   Left lateral flexion 90% limited P! 50% limited P! 75% limited P!  Right rotation     Left rotation      (Blank rows = not tested)  LOWER EXTREMITY ROM:     Active  Right eval Left eval  Hip  flexion    Hip extension    Hip abduction    Hip adduction    Hip internal rotation    Hip external rotation    Knee flexion 110 128  Knee extension 0 0  Ankle dorsiflexion    Ankle plantarflexion    Ankle inversion    Ankle eversion     (Blank rows = not tested)  LOWER EXTREMITY MMT:    MMT Right eval Left eval R / L 12/29/23 R 01/25/24 L 01/25/24 R 02/08/24 L 02/08/24  Hip flexion 3 3 3+ / 3+ 3 3+ 4 4  Hip extension         Hip  abduction 4+ 4+ 5- / 5- 4- sidelying  4+ sidelying  4 4  Hip abd 4- 4- 5 /5      Hip internal rotation         Hip external rotation         Knee flexion 4 5 5 5 5  4+ 4+  Knee extension 3+ 4 4 4+ 4+ 4+ 4+  Ankle dorsiflexion         Ankle plantarflexion         Ankle inversion         Ankle eversion          (Blank rows = not tested)  LUMBAR SPECIAL TESTS:  Slump test: Negative  FUNCTIONAL TESTS:  30 seconds chair stand test Timed up and go (TUG): 21.24   02/08/24 TUG 13.5 seconds no device  30 second chair stand test x9 reps using UEs on chair       Item Test date: 11/05/23 Date: 12/29/23 Date: 01/25/24  Sitting to standing 2. able to stand using hands after several tries 3. able to stand independently using hands  4. able to stand safely for 2 minutes  4. able to sit safely and securely for 2 minutes   3. controls descent by using hands  3. able to transfer safely with definite need of hands  4. able to stand 10 seconds safely  3. able to place feet together independently and stand 1 minute with supervision  4. can reach forward confidently 25 cm (10 inches)    2. unable to pick up but reaches 2-5 cm (1-2 inches) from slipper and keeps balance independently  3. looks behind one side only, other side shows less weight shift    1. needs close supervision or verbal cuing  0. needs assistance to keep from falling/unable to try    0. loses balance while stepping or standing   0. unable to try of needs assist to prevent fall     Insert SmartPhrase OPRCBERGREEVAL  2. Standing unsupported 4. able to stand safely for 2 minutes    3. Sitting with back /unsupported, feet supported 4. able to sit safely and securely for 2 minutes    4. Standing to sitting 2. uses back of legs against chair to control descent    5. Pivot transfer  3. able to transfer safely with definite need of hands    6. Standing unsupported with eyes closed 2. able to stand 3 seconds    7. Standing  unsupported with feet together 1. needs help to attain position but able to stand 15 seconds feet together    8. Reaching forward with outstretched arms while standing 2. can reach forward 5 cm (2 inches)    9. Pick up object from the floor from standing 3. able to pick  up slipper but needs supervision    10. Turning to look behind over left and right shoulders while standing 3. looks behind one side only, other side shows less weight shift    11. Turn 360 degrees 1. needs close supervision or verbal cuing    12. Place alternate foot on step or stool while standing unsupported 0. needs assistance to keep from falling/unable to try    13. Standing unsupported one foot in front 0. loses balance while stepping or standing    14. Standing on one leg 0. unable to try of needs assist to prevent fall      Total Score 27/56 Total Score:   34/56 Total Score: 43/56       01/25/24   Standardized Balance Assessment  Standardized Balance Assessment Berg Balance Test  Berg Balance Test  Sit to Stand 4  Standing Unsupported 4  Sitting with Back Unsupported but Feet Supported on Floor or Stool 4  Stand to Sit 4  Transfers 4  Standing Unsupported with Eyes Closed 3  Standing Unsupported with Feet Together 3  From Standing, Reach Forward with Outstretched Arm 3  From Standing Position, Pick up Object from Floor 3  From Standing Position, Turn to Look Behind Over each Shoulder 3  Turn 360 Degrees 2  Standing Unsupported, Alternately Place Feet on Step/Stool 3  Standing Unsupported, One Foot in Front 0  Standing on One Leg 3  Total Score 43        GAIT: Distance walked: 400 ft Assistive device utilized: rollator Level of assistance: Complete Independence Comments: quickened short step length, reduced hip/knee flex, little arm swing  TREATMENT  [facility] Adult PT Treatment:    02/19/24 Pt seen for aquatic therapy today.  Treatment took place in water  3.5-4.75 ft in depth at the [facility] pool. Temp of water  was 91.  Pt entered/exited the pool via stairs with bil rail.  -walking forward/ backward, unsupported, multiple laps, cues for even step height, neutral Rt foot -side stepping with UE add/abd with rainbow hand floats -> into wide squat with arm add/abdct -in 65ft 6 tandem gait forward/ backward, UE on rainbow hand floats (big balance challenge) - L stretch at wall - return to walking forward/ backward with reciprocal arm swing - forward step with single row with  medium resistance bell, and return to neutral x 10 each side (balance challenge)  -UE on wall: hip abdct/ add x 10; single leg clams x 10 each - wall push up/off x10 - STS on 3rd step x 3 reps with SBA (improved form with repetition)  02/15/24:                                           Nustep L6x8 minutes seat 10 all four extremities  Seated LAQ Seated marching  1 lap around clinic with focus on big steps Sit to stands from hi -low table  Toe tap to cone in center to work on hip flexion and ER for putting on pants Stepping over tape on floor in square to help with big steps in lateral/ fwd/ bkwd directions Assessment of coordination on LE Discussion about parkinson type symptoms.    02/12/24 Pt seen for aquatic therapy today.  Treatment took place in water  3.5-4.75 ft in depth at the [facility] pool. Temp of water  was 91.  Pt entered/exited the pool  via stairs with bil rail.  -walking forward and backward, unsupported, multiple laps, cues for even step length and vertical trunk -side stepping with ue horizontal add/abd with rainbow hand floats -> abdct/add with rainbow hand floats - suitcase carry with single rainbow hand float, walking forward/backward  -Bow and Arrow (difficulty coordinating). Focused on weight shift back/forth. - forward step with single row with light resistance bell, and return to neutral x 8 each side - L stretch with UE on wall x 3 reps of 10-15 sec -  return to walking forward/ backward  -SLS in 4+ ft with shoulder add/abdct with short hollow noodles x 5 eac h(increased back pain) - repeated L stretch at wall - tandem gait forward/ backward, hands on top of water  (challenge) - STS on 3rd step x 3 reps with SBA (improved form with repetition)  02/08/24  Nustep L6x8 minutes seat 10 all four extremities   ODI, MMT, TUG, 30 second chair stand test, lumbar ROM, goals  Tandem stance blue foam 6x30 seconds alternating SLS x15 seconds B alternating    02/05/24 Pt seen for aquatic therapy today.  Treatment took place in water  3.5-4.75 ft in depth at the [facility] pool. Temp of water  was 91.  Pt entered/exited the pool via stairs with bil rail.  -walking forward and back -L stretch->hip hiking -side stepping with ue horizontal add/abd -wide stance ue horizontal add/abd -Bow and Arrow (difficulty coordinating). Focused on weight shift back/forth. -STS from water  bench onto step unsupported 2x 10.  VC and demonstration for execution. Slowed descent.  Good execution no LOB -figure 4 stretch at steps (Not tolerated well) -open book R/L x 5    02/01/24  Nustep L6x8 minutes seat 10 all four extremities   Lumbar rotation stretch 5x5 seconds B  SKTC 5x5 seconds B Figure 4 stretch B 2x30 seconds Thoracic flexion/extension AROM x10   Tandem stance blue foam 6x30 seconds alternating  Sidesteps on blue foam pad x3 laps      01/25/24  Nustep L6x8 minutes seat 10 all four extremities  MMT, Berg, goals, education on progress with PT  Tandem stance blue foam pad 6x30 seconds alternating Standing marches/tapping top of 6 inch box blue foam pad x20  Forward and lateral steps over hurdles at bar x2 laps Lateral steps over hurdles x1 lap at bar, got light headed- BP 133/72 HR 76, dizziness resolved before we checked SpO2     01/20/24 Pt seen for aquatic therapy today.  Treatment took place in water  3.5-4.75 ft in depth at  the [facility] pool. Temp of water  was 91.  Pt entered/exited the pool via stairs with bil rail.  -walking forward and back -wide stance ue horizontal add/abd -Bow and Arrow (difficulty coordinating). Focused on weight shift back/forth. -alternating ue row wide stance -open book R/L x 5 - wall push up/off for reactive balance x 10.  Cues for stronger push to activate balance reaction in ankles -weight shift forward->back; back->forward x 5 ue support yellow hb -ue support wall stepping over opposite foot R/L->grapevine. Ue support HB along the length of the wall for extra support -walking forward and back between exercises fro recovery  01/15/24 Nustep lvl 5, 7 min Step up onto forward 6 step, bilat  Step up laterally onto 6 bilat  Stepping over hurdles fwd and lateral with focus on big steps Seated high marches SL stance on, bilat LE with finger tip hold, 30 sec hold Tandem stance, bilat with finger tip hold,  30 sec hold 2 laps around clinic with SPC with gait belt to assess gait mechanics.    11/13  There-ex:  Lumbar ball stretch x 5 sec hold  Lateral ball stretch 5x 5 sec hold  Neuro re-ed:  LAQ 2x10 each leg  Hip abduction 2x10 red  Balance:  Narrow base of support EO and EC 2x30 seconds each   Tandem stance EO 2x30 sec hold    Date: 01/05/24 Pt seen for aquatic therapy today.  Treatment took place in water  3.5-4.75 ft in depth at the [facility] pool. Temp of water  was 91.  Pt entered/exited the pool via stairs with bil rail.  -walking forward and back -Stair negotiation alternating steps ue support intermittently on handrails (iincreasingly as decreased submersion) -step up leading R/L 2 x 5. VC for reduced UE support - tandem gait forward/ backward ->with rainbow hand floats (good balance challenge) - alternating toe taps to 1st step x 10, without UE support->2nd step -L stretch-> hip hiking - wall push up/off for reactive balance x 10 -  TrA set with hollow long noodle in standard stance x 10, staggered stance x 8 (improving balance and control good challenge)  - suitcase carry with bil rainbow hand float, marching forward / backward -walking forward and back between exercises fro recovery  01/01/24 STM to glutes, lumbar paraspinals  Standing marches x20 Standing hip abduction x10 Gait training in hallway  Gait training with hurdles and blue airex pad      PATIENT EDUCATION:  Education details: intro to aquatic therapy   Person educated: Patient Education method: Explanation, demonstration Education comprehension: verbalized understanding  HOME EXERCISE PROGRAM:  Access Code: [email or web address] URL: [email or web address] Date: 02/01/2024 Prepared by: Josette Rough  Exercises - Theracane Over Shoulder  - 1 x daily - 7 x weekly - 3 sets - 10 reps - Seated Bilateral Shoulder Flexion Towel Slide at Table Top  - 1 x daily - 7 x weekly - 3 sets - 10 reps - Sit to Stand with Arms Crossed  - 2 x daily - 7 x weekly - 2 sets - 10 reps - Seated Hip Abduction with Resistance  - 1 x daily - 7 x weekly - 3 sets - 10 reps - Seated Long Arc Quad  - 1 x daily - 7 x weekly - 3 sets - 10 reps - Standing March with Counter Support  - 1 x daily - 7 x weekly - 3 sets - 10 reps - Standing Hip Abduction with Counter Support  - 1 x daily - 7 x weekly - 3 sets - 10 reps - Standing Hip Extension with Counter Support  - 1 x daily - 7 x weekly - 3 sets - 10 reps - Narrow Stance with Counter Support  - 1 x daily - 7 x weekly - 3 sets - 10 reps - Standing Tandem Balance with Counter Support  - 1 x daily - 7 x weekly - 3 sets - 10 reps - Supine Lower Trunk Rotation  - 1 x daily - 7 x weekly - 1-2 sets - 10 reps - 5 seconds  hold - Supine Single Knee to Chest Stretch  - 1 x daily - 7 x weekly - 1-2 sets - 10 reps - 5 seconds  hold - Supine Figure 4 Piriformis Stretch  - 1 x daily - 7 x weekly - 1-2 sets - 6 reps - 30 seconds  hold -  Seated Thoracic Flexion and Extension  -  1 x daily - 7 x weekly - 1-2 sets - 10 reps   ASSESSMENT:  CLINICAL IMPRESSION: Pt continues to be challenged with narrow base of support exercises- minor LOB with tandem gait when in Lt SLS during transition; able to recovery without difficulty. Some increase in back pain with SLS and NBOS exercises, eased with L stretch at pool wall. Pain remained relatively low during session.  Will continue to progress towards remaining goals.     PN #2- patient continues to have back pain but she has made great progress in terms of functional strength, balance, and overall mobility. She does however remain a bit of a high fall risk and also demonstrates impaired lumbar ROM as well as impaired functional activity tolerance. Feel that she would benefit from skilled PT services in order to continue fine tuning all remaining impairments/working toward goals yet unmet, also to establish appropriate long term independent exercise programming prior to possible DC at end of current cert/scheduled visits.      OBJECTIVE IMPAIRMENTS: Abnormal gait, decreased activity tolerance, decreased balance, decreased knowledge of use of DME, decreased mobility, difficulty walking, decreased ROM, decreased strength, postural dysfunction, obesity, and pain.   ACTIVITY LIMITATIONS: carrying, lifting, bending, sitting, standing, squatting, stairs, and transfers  PARTICIPATION LIMITATIONS: meal prep, cleaning, laundry, driving, shopping, and community activity  PERSONAL FACTORS: Age and 1-2 comorbidities: see PmHx are also affecting patient's functional outcome.   REHAB POTENTIAL: Good  CLINICAL DECISION MAKING: Evolving/moderate complexity  EVALUATION COMPLEXITY: Moderate   GOALS: Goals reviewed with patient? Yes  SHORT TERM GOALS: Target date: 12/14/23 (delay in beginning therapy)  Pt will tolerate full aquatic sessions consistently without increase in pain and with improving  function to demonstrate good toleration and effectiveness of intervention.  Baseline: Goal status: Met 12/29/23  2.  Pt will tolerate stair climbing in and out of pool using approp pattern ascending and descending 6 steps with use of handrail  Baseline:  Goal status: Met 12/29/23  3.  Pt will perform 10 STS from water  bench onto water  step unsupported without LOB Baseline:  Goal status:  Met 12/29/23  4.  Pt and cg will consider grab bars for commode  Baseline:  Goal status: deferred as pt reports she is rising off of a raised commode without difficulty 12/29/23  5.  Pt will be indep and compliant with initial land based HEP for improving strength and safety at home Baseline:  Goal status: Met 12/29/23    LONG TERM GOALS: Target date: 03/19/23  Pt to improve on ODI by 13% to demonstrate statistically significant Improvement in function. (MCID 13-15%) Baseline: 23/50=46% Goal status: ONGOING 02/08/24   2.  Pt will improve strength in bil hip flex and right knee ext by 1 grade to demonstrate improved overall physical function Baseline: see chart Goal status:  MET 02/08/24  3.  Pt will improve on Berg balance test to >/= 35/56 to demonstrate a decrease in fall risk. (MCD 7) Baseline:27/56 (medium fall risk) ; 34/56 Goal status: MET 01/25/24 see above   4.  Pt will improve on 30s STS test  from standard chair from 3 to 6 to demonstrate improving functional lower extremity strength, transitional movements, and balance. (MDC = 4.2sec)  Baseline: 3 Goal status: MET 02/08/24   5.  Pt will report decrease in pain by at least 50% for improved toleration to activity/quality of life and to demonstrate improved management of pain. Baseline: see chart Goal status: ONGOING 02/08/24   6.  Pt will be indep with final HEP's (land and aquatic as appropriate) for continued management of condition Baseline: see chart Goal status: ONGOING 02/08/24   PLAN:  PT FREQUENCY: 2x/week  PT DURATION:  10 w(extended out due to holiday schedule) alternate land/aquatics  PLANNED INTERVENTIONS: 97164- PT Re-evaluation, 97750- Physical Performance Testing, 97110-Therapeutic exercises, 97530- Therapeutic activity, 97112- Neuromuscular re-education, 97535- Self Care, 02859- Manual therapy, [id number]- Gait training, [phone]- Aquatic Therapy, [phone]- Ionotophoresis 4mg /ml Dexamethasone , 79439 (1-2 muscles), 20561 (3+ muscles)- Dry Needling, Patient/Family education, Balance training, Stair training, Taping, Joint mobilization, DME instructions, Cryotherapy, and Moist heat.  PLAN FOR NEXT SESSION: aquatic: core and le strengthening, transfers, stair climbing, balance retraining  Land:  continue to progress balance and glute/quad strength, continue with more dynamic surfaces/activities, continue to work on back spasm/pain if its still present    Delon Aquas, PTA 02/19/2024 5:18 PM [facility] [address] [place], [place], [address] Phone: [phone]   Fax:  [phone]   "

## 2024-02-20 NOTE — Therapy (Unsigned)
 " OUTPATIENT PHYSICAL THERAPY THORACOLUMBAR TREATMENT Patient Name: [name] MRN: [id number] DOB:03/20/1940, 83 y.o., female Today's Date: 02/22/2024  END OF SESSION:  PT End of Session - 02/22/24 1101     Visit Number 23    Number of Visits 16    Date for Recertification  03/18/24    Authorization Type aetna Mcr    Progress Note Due on Visit 29    PT Start Time 1058    PT Stop Time 1140    PT Time Calculation (min) 42 min    Equipment Utilized During Treatment Gait belt    Activity Tolerance Patient tolerated treatment well    Behavior During Therapy WFL for tasks assessed/performed           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   Hyperlipidemia    Impaired hearing    left ear, wears hearing aids   Insomnia    circadian rhythm component   Insomnia    Migraine 11/1986   Spillman   Nuclear sclerosis    OSA on CPAP 03/2005   uses cpap setting of 10   Osteoarthritis 2007   Deveschwar   Pain last week   left under breast pain    Plantar fasciitis    Rheumatic fever    Scarlet fever as child   Sleep apnea    Swallowing difficulty    Thyroid  disorder    Vitamin D  deficiency    Vitreous degeneration of right eye    Past Surgical History:  Procedure Laterality Date   BREAST BIOPSY Left 6/00   [place]   BREAST EXCISIONAL BIOPSY Left 1998   DE QUERVAIN'S RELEASE Right 10/97   Sypher   left breast biopsy     ROOT CANAL  02/11/2012   TONSILLECTOMY  age 3   TONSILLECTOMY AND ADENOIDECTOMY  1952   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   TOTAL KNEE ARTHROPLASTY Right 11/08/2012   Procedure: RIGHT TOTAL KNEE ARTHROPLASTY;  Surgeon: Dempsey LULLA Moan, MD;  Location: [facility];  Service: Orthopedics;  Laterality: Right;    TUBAL LIGATION     Patient Active Problem List   Diagnosis Date Noted   Venous stasis of lower extremity 07/28/2023   Great toe pain, right 06/16/2023   Acute pain of right knee 05/28/2023   Closed compression fracture of body of first lumbar vertebra (HCC) 05/28/2023   Bilateral lower extremity edema 02/03/2022   Fluid retention 12/17/2021   Low back pain 10/03/2021   Atherosclerotic heart disease of native coronary artery without angina pectoris 05/27/2021   Diastolic dysfunction 05/27/2021   Neck pain 05/27/2021   Migraine 04/23/2021   Generalized obesity 04/23/2021   Depression 03/18/2021   Lumbar spine pain 08/30/2020   Flatulence, eructation and gas pain 05/07/2020   Change in bowel habit 05/07/2020   Gout 06/22/2019   Post viral syndrome 06/15/2019   Cervicalgia 06/15/2019   Chronic tension-type headache, intractable 06/15/2019   Fall 04/21/2018   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   Diverticulosis 12/23/2017   History of adenomatous polyp of colon 12/23/2017   Recurrent falls 12/14/2017   Degenerative disc disease, cervical 12/14/2017   Chronic constipation 12/14/2017  Floaters in visual field, bilateral 08/10/2017   Head injury consultation 08/10/2017   Arthritis of carpometacarpal [facility]) joint of right thumb 05/14/2017   Therapeutic opioid-induced constipation (OIC) 02/05/2017   BMI 29.0-29.9,adult 02/05/2017   Dry mouth 02/05/2017   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/08/2017   Intolerance of continuous positive airway pressure (CPAP) ventilation 01/08/2017   Benign neoplasm of colon 11/11/2016   Other long term (current) drug therapy 11/11/2016   History of total knee arthroplasty, 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   Cough 03/06/2016   At risk for injury related to fall 02/14/2016   Primary osteoarthritis of both hands 02/04/2016   Osteopenia of multiple sites 02/04/2016   Metatarsalgia of both feet 02/04/2016   Migraines 02/04/2016   Other fatigue  02/01/2016   Melena 08/20/2015   Acute recurrent maxillary sinusitis 08/20/2015   Slow transit constipation 08/20/2015   Vitamin D  deficiency 08/20/2015   Thyroid  activity decreased 08/20/2015   Cephalalgia 08/20/2015   Preop cardiovascular exam 07/20/2015   Subacute ethmoidal sinusitis 12/11/2014   Chronic infection of sinus 07/12/2014   Nasal septal ulcer 05/25/2014   DJD (degenerative joint disease), cervical 03/22/2014   Right hand pain 03/22/2014   Deflected nasal septum 01/30/2014   Hypertrophy of nasal turbinates 01/30/2014   UARS (upper airway resistance syndrome) 10/04/2013   Insomnia secondary to depression with anxiety 10/04/2013   Exertional dyspnea 08/20/2013   History of rheumatic fever 08/20/2013   Hypomania (mild) single episode or unspecified 08/09/2013   OSA on CPAP 04/06/2013   Postoperative anemia due to acute blood loss 11/09/2012   OA (osteoarthritis) of knee 11/08/2012   History of giardia infection 07/15/2012   Atrophic vaginitis 07/15/2012   Vitreous degeneration of right eye    Nuclear sclerosis    Right shoulder pain 06/18/2011   Foot pain, left 06/18/2011   Hyperlipidemia 03/24/2011   Disorder of bone 02/12/2009   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 I think it is due to the rain.    EvalBETHA Rasmussen in June. Have had  2 compression fx.    2 falls in one day early march 1st compression fx then again in June sustaining a second.  Has a membership here at [facility]. I have been Walking in pool 4 x week for 45 mins to 1 hr. But I still hurt. I want to work on my balance and stand up from a chair without using my arms.  Standing up from a chair my right knee and back hurts. Using rollator when walking back to pool but not any other time  PERTINENT HISTORY:  Evaluate and treat for L1 compression fx and right knee pain. Include aquatic therapy in treatment plan.  Chronic fatigue; fibromyalgia  PAIN:  Are you having pain? yes: NPRS scale:3/10 Pain location: R side low back Pain description: very tight, sore to touch   Aggravating factors: not sure Relieving factors: rest. Voltaren  and heat   PRECAUTIONS: Fall  RED FLAGS: None   WEIGHT BEARING RESTRICTIONS: No  FALLS:  Has patient fallen in last 6 months? Yes. Number of falls 1 fall backwards after putting my foot on a chair to toe my shoe.  LIVING ENVIRONMENT: Lives with: lives with their spouse Lives in: House/apartment Has following equipment at home: Single point cane, Environmental Consultant - 2 wheeled, Environmental Consultant - 4 wheeled, and Grab bars  OCCUPATION: retired financial controller  PLOF: Independent  PATIENT GOALS: Improve balance, get in and out of chairs, car and bed better  NEXT MD VISIT: as needed  OBJECTIVE:  Note: Objective measures were completed at Evaluation unless otherwise noted.  DIAGNOSTIC FINDINGS:  7/25 CT Lumbar IMPRESSION: 1. Subacute compression deformities at T12 and L1 with loss of height of 10% at each level. Sclerotic change consistent with ongoing healing process. I cannot state by CT if there is complete healing. No retropulsed bone. 2. L3-4: Mild disc bulge and facet  osteoarthritis. No compressive stenosis. 3. L4-5: Mild disc bulge. Bilateral facet degeneration and hypertrophy. 1-2 mm of degenerative anterolisthesis. Mild narrowing of both lateral recesses, not grossly compressive. 4. L5-S1: Endplate osteophytes and bulging of the disc. Bilateral facet osteoarthritis worse on the right. No compressive stenosis.  05/29/23 Xray R knee IMPRESSION: 1. 9.3 x 3.9 x 3.7 cm heterogenous layering hypodense fluid collection most compatible with a hematoma extending along the superficial fascia of the right posterolateral thigh, overlying and compressing the biceps femoris musculature, which could represent a soft tissue degloving injury, such as a Morel-Lavallee lesion. This collection appears to continue below the level of the knee into the lateral gastrocnemius muscle, concerning for intramuscular hematoma. 2. Intact right total knee arthroplasty. No acute osseous abnormality. 3. Small knee joint effusion.  PATIENT SURVEYS:  ODI: 23/50=46%                       ODI 20/50 = 40% COGNITION: Overall cognitive status: Within functional limits for tasks assessed     SENSATION: WFL  MUSCLE LENGTH: Hamstrings: wfl   POSTURE: rounded shoulders, forward head, decreased thoracic kyphosis, and left pelvic obliquity  PALPATION: Right patella crepitus  LUMBAR ROM:   AROM eval 12/29/23 02/08/24  Flexion full    Extension     Right lateral flexion 90% limited P! 50% limited P! 40% limited P!   Left lateral flexion 90% limited  P! 50% limited P! 75% limited P!  Right rotation     Left rotation      (Blank rows = not tested)  LOWER EXTREMITY ROM:     Active  Right eval Left eval  Hip flexion    Hip extension    Hip abduction    Hip adduction    Hip internal rotation    Hip external rotation    Knee flexion 110 128  Knee extension 0 0  Ankle dorsiflexion    Ankle plantarflexion    Ankle inversion    Ankle eversion     (Blank rows = not  tested)  LOWER EXTREMITY MMT:    MMT Right eval Left eval R / L 12/29/23 R 01/25/24 L 01/25/24 R 02/08/24 L 02/08/24  Hip flexion 3 3 3+ / 3+ 3 3+ 4 4  Hip extension         Hip abduction 4+ 4+ 5- / 5- 4- sidelying  4+ sidelying  4 4  Hip abd 4- 4- 5 /5      Hip internal rotation         Hip external rotation         Knee flexion 4 5 5 5 5  4+ 4+  Knee extension 3+ 4 4 4+ 4+ 4+ 4+  Ankle dorsiflexion         Ankle plantarflexion         Ankle inversion         Ankle eversion          (Blank rows = not tested)  LUMBAR SPECIAL TESTS:  Slump test: Negative  FUNCTIONAL TESTS:  30 seconds chair stand test Timed up and go (TUG): 21.24   02/08/24 TUG 13.5 seconds no device  30 second chair stand test x9 reps using UEs on chair       Item Test date: 11/05/23 Date: 12/29/23 Date: 01/25/24  Sitting to standing 2. able to stand using hands after several tries 3. able to stand independently using hands  4. able to stand safely for 2 minutes  4. able to sit safely and securely for 2 minutes   3. controls descent by using hands  3. able to transfer safely with definite need of hands  4. able to stand 10 seconds safely  3. able to place feet together independently and stand 1 minute with supervision  4. can reach forward confidently 25 cm (10 inches)    2. unable to pick up but reaches 2-5 cm (1-2 inches) from slipper and keeps balance independently  3. looks behind one side only, other side shows less weight shift    1. needs close supervision or verbal cuing  0. needs assistance to keep from falling/unable to try    0. loses balance while stepping or standing   0. unable to try of needs assist to prevent fall     Insert SmartPhrase OPRCBERGREEVAL  2. Standing unsupported 4. able to stand safely for 2 minutes    3. Sitting with back /unsupported, feet supported 4. able to sit safely and securely for 2 minutes    4. Standing to sitting 2. uses back of legs against chair to  control descent    5. Pivot transfer  3. able to transfer safely with definite need of hands    6. Standing unsupported with eyes closed 2. able to stand 3 seconds    7. Standing unsupported with feet together 1. needs help to attain position but  able to stand 15 seconds feet together    8. Reaching forward with outstretched arms while standing 2. can reach forward 5 cm (2 inches)    9. Pick up object from the floor from standing 3. able to pick up slipper but needs supervision    10. Turning to look behind over left and right shoulders while standing 3. looks behind one side only, other side shows less weight shift    11. Turn 360 degrees 1. needs close supervision or verbal cuing    12. Place alternate foot on step or stool while standing unsupported 0. needs assistance to keep from falling/unable to try    13. Standing unsupported one foot in front 0. loses balance while stepping or standing    14. Standing on one leg 0. unable to try of needs assist to prevent fall      Total Score 27/56 Total Score:   34/56 Total Score: 43/56       01/25/24   Standardized Balance Assessment  Standardized Balance Assessment Berg Balance Test  Berg Balance Test  Sit to Stand 4  Standing Unsupported 4  Sitting with Back Unsupported but Feet Supported on Floor or Stool 4  Stand to Sit 4  Transfers 4  Standing Unsupported with Eyes Closed 3  Standing Unsupported with Feet Together 3  From Standing, Reach Forward with Outstretched Arm 3  From Standing Position, Pick up Object from Floor 3  From Standing Position, Turn to Look Behind Over each Shoulder 3  Turn 360 Degrees 2  Standing Unsupported, Alternately Place Feet on Step/Stool 3  Standing Unsupported, One Foot in Front 0  Standing on One Leg 3  Total Score 43        GAIT: Distance walked: 400 ft Assistive device utilized: rollator Level of assistance: Complete Independence Comments: quickened short step length, reduced hip/knee flex,  little arm swing  TREATMENT  [facility] Adult PT Treatment:    02/22/24 Nustep L4x8 minutes seat 9 all LE extremities  LAQ 2x10  Seated marching 2x10  Supine LTR x20  Supine SLR x10  Supine marching  Sit to stand with momentum strategy   Discussed coming in on weekends to walk in the water  Discussion about importance of    02/19/24 Pt seen for aquatic therapy today.  Treatment took place in water  3.5-4.75 ft in depth at the [facility] pool. Temp of water  was 91.  Pt entered/exited the pool via stairs with bil rail.  -walking forward/ backward, unsupported, multiple laps, cues for even step height, neutral Rt foot -side stepping with UE add/abd with rainbow hand floats -> into wide squat with arm add/abdct -in 75ft 6 tandem gait forward/ backward, UE on rainbow hand floats (big balance challenge) - L stretch at wall - return to walking forward/ backward with reciprocal arm swing - forward step with single row with  medium resistance bell, and return to neutral x 10 each side (balance challenge)  -UE on wall: hip abdct/ add x 10; single leg clams x 10 each - wall push up/off x10 - STS on 3rd step x 3 reps with SBA (improved form with repetition)  02/15/24:                                           Nustep L6x8 minutes seat 10 all four extremities  Seated LAQ Seated marching  1  lap around clinic with focus on big steps Sit to stands from hi -low table  Toe tap to cone in center to work on hip flexion and ER for putting on pants Stepping over tape on floor in square to help with big steps in lateral/ fwd/ bkwd directions Assessment of coordination on LE Discussion about parkinson type symptoms.    02/12/24 Pt seen for aquatic therapy today.  Treatment took place in water  3.5-4.75 ft in depth at the [facility] pool. Temp of water  was 91.  Pt entered/exited the pool via stairs with bil rail.  -walking forward and backward, unsupported, multiple laps, cues for  even step length and vertical trunk -side stepping with ue horizontal add/abd with rainbow hand floats -> abdct/add with rainbow hand floats - suitcase carry with single rainbow hand float, walking forward/backward  -Bow and Arrow (difficulty coordinating). Focused on weight shift back/forth. - forward step with single row with light resistance bell, and return to neutral x 8 each side - L stretch with UE on wall x 3 reps of 10-15 sec - return to walking forward/ backward  -SLS in 4+ ft with shoulder add/abdct with short hollow noodles x 5 eac h(increased back pain) - repeated L stretch at wall - tandem gait forward/ backward, hands on top of water  (challenge) - STS on 3rd step x 3 reps with SBA (improved form with repetition)  02/08/24  Nustep L6x8 minutes seat 10 all four extremities   ODI, MMT, TUG, 30 second chair stand test, lumbar ROM, goals  Tandem stance blue foam 6x30 seconds alternating SLS x15 seconds B alternating   02/05/24 Pt seen for aquatic therapy today.  Treatment took place in water  3.5-4.75 ft in depth at the [facility] pool. Temp of water  was 91.  Pt entered/exited the pool via stairs with bil rail.  -walking forward and back -L stretch->hip hiking -side stepping with ue horizontal add/abd -wide stance ue horizontal add/abd -Bow and Arrow (difficulty coordinating). Focused on weight shift back/forth. -STS from water  bench onto step unsupported 2x 10.  VC and demonstration for execution. Slowed descent.  Good execution no LOB -figure 4 stretch at steps (Not tolerated well) -open book R/L x 5  02/01/24  Nustep L6x8 minutes seat 10 all four extremities   Lumbar rotation stretch 5x5 seconds B  SKTC 5x5 seconds B Figure 4 stretch B 2x30 seconds Thoracic flexion/extension AROM x10   Tandem stance blue foam 6x30 seconds alternating  Sidesteps on blue foam pad x3 laps   01/25/24  Nustep L6x8 minutes seat 10 all four extremities  MMT, Berg,  goals, education on progress with PT  Tandem stance blue foam pad 6x30 seconds alternating Standing marches/tapping top of 6 inch box blue foam pad x20  Forward and lateral steps over hurdles at bar x2 laps Lateral steps over hurdles x1 lap at bar, got light headed- BP 133/72 HR 76, dizziness resolved before we checked SpO2    PATIENT EDUCATION:  Education details: intro to aquatic therapy   Person educated: Patient Education method: Explanation, demonstration Education comprehension: verbalized understanding  HOME EXERCISE PROGRAM:  Access Code: [email or web address] URL: [email or web address] Date: 02/01/2024 Prepared by: Josette Rough  Exercises - Theracane Over Shoulder  - 1 x daily - 7 x weekly - 3 sets - 10 reps - Seated Bilateral Shoulder Flexion Towel Slide at Table Top  - 1 x daily - 7 x weekly - 3 sets - 10 reps - Sit to Stand with Arms Crossed  -  2 x daily - 7 x weekly - 2 sets - 10 reps - Seated Hip Abduction with Resistance  - 1 x daily - 7 x weekly - 3 sets - 10 reps - Seated Long Arc Quad  - 1 x daily - 7 x weekly - 3 sets - 10 reps - Standing March with Counter Support  - 1 x daily - 7 x weekly - 3 sets - 10 reps - Standing Hip Abduction with Counter Support  - 1 x daily - 7 x weekly - 3 sets - 10 reps - Standing Hip Extension with Counter Support  - 1 x daily - 7 x weekly - 3 sets - 10 reps - Narrow Stance with Counter Support  - 1 x daily - 7 x weekly - 3 sets - 10 reps - Standing Tandem Balance with Counter Support  - 1 x daily - 7 x weekly - 3 sets - 10 reps - Supine Lower Trunk Rotation  - 1 x daily - 7 x weekly - 1-2 sets - 10 reps - 5 seconds  hold - Supine Single Knee to Chest Stretch  - 1 x daily - 7 x weekly - 1-2 sets - 10 reps - 5 seconds  hold - Supine Figure 4 Piriformis Stretch  - 1 x daily - 7 x weekly - 1-2 sets - 6 reps - 30 seconds  hold - Seated Thoracic Flexion and Extension  - 1 x daily - 7 x weekly - 1-2 sets - 10  reps   ASSESSMENT:  CLINICAL IMPRESSION: Pt comes in today with frustrations due to increase in pain and continued difficulty with ADL's. She request we focus on LE strengthening to help with continued strength and mobility. She tolerated session well with cues for slow controlled movements. She has increased difficulty with coming from supine to short sitting despite cues for log roll, requiring Mod assistance. Pt will continue to benefit from skilled PT to address continued deficits.    Pt continues to be challenged with narrow base of support exercises- minor LOB with tandem gait when in Lt SLS during transition; able to recovery without difficulty. Some increase in back pain with SLS and NBOS exercises, eased with L stretch at pool wall. Pain remained relatively low during session.  Will continue to progress towards remaining goals.     PN #2- patient continues to have back pain but she has made great progress in terms of functional strength, balance, and overall mobility. She does however remain a bit of a high fall risk and also demonstrates impaired lumbar ROM as well as impaired functional activity tolerance. Feel that she would benefit from skilled PT services in order to continue fine tuning all remaining impairments/working toward goals yet unmet, also to establish appropriate long term independent exercise programming prior to possible DC at end of current cert/scheduled visits.      OBJECTIVE IMPAIRMENTS: Abnormal gait, decreased activity tolerance, decreased balance, decreased knowledge of use of DME, decreased mobility, difficulty walking, decreased ROM, decreased strength, postural dysfunction, obesity, and pain.   ACTIVITY LIMITATIONS: carrying, lifting, bending, sitting, standing, squatting, stairs, and transfers  PARTICIPATION LIMITATIONS: meal prep, cleaning, laundry, driving, shopping, and community activity  PERSONAL FACTORS: Age and 1-2 comorbidities: see PmHx are also  affecting patient's functional outcome.   REHAB POTENTIAL: Good  CLINICAL DECISION MAKING: Evolving/moderate complexity  EVALUATION COMPLEXITY: Moderate   GOALS: Goals reviewed with patient? Yes  SHORT TERM GOALS: Target date: 12/14/23 (delay in beginning therapy)  Pt will tolerate full aquatic sessions consistently without increase in pain and with improving function to demonstrate good toleration and effectiveness of intervention.  Baseline: Goal status: Met 12/29/23  2.  Pt will tolerate stair climbing in and out of pool using approp pattern ascending and descending 6 steps with use of handrail  Baseline:  Goal status: Met 12/29/23  3.  Pt will perform 10 STS from water  bench onto water  step unsupported without LOB Baseline:  Goal status:  Met 12/29/23  4.  Pt and cg will consider grab bars for commode  Baseline:  Goal status: deferred as pt reports she is rising off of a raised commode without difficulty 12/29/23  5.  Pt will be indep and compliant with initial land based HEP for improving strength and safety at home Baseline:  Goal status: Met 12/29/23    LONG TERM GOALS: Target date: 03/19/23  Pt to improve on ODI by 13% to demonstrate statistically significant Improvement in function. (MCID 13-15%) Baseline: 23/50=46% Goal status: ONGOING 02/08/24   2.  Pt will improve strength in bil hip flex and right knee ext by 1 grade to demonstrate improved overall physical function Baseline: see chart Goal status:  MET 02/08/24  3.  Pt will improve on Berg balance test to >/= 35/56 to demonstrate a decrease in fall risk. (MCD 7) Baseline:27/56 (medium fall risk) ; 34/56 Goal status: MET 01/25/24 see above   4.  Pt will improve on 30s STS test  from standard chair from 3 to 6 to demonstrate improving functional lower extremity strength, transitional movements, and balance. (MDC = 4.2sec)  Baseline: 3 Goal status: MET 02/08/24   5.  Pt will report decrease in pain by at  least 50% for improved toleration to activity/quality of life and to demonstrate improved management of pain. Baseline: see chart Goal status: ONGOING 02/08/24   6.  Pt will be indep with final HEP's (land and aquatic as appropriate) for continued management of condition Baseline: see chart Goal status: ONGOING 02/08/24   PLAN:  PT FREQUENCY: 2x/week  PT DURATION: 10 w(extended out due to holiday schedule) alternate land/aquatics  PLANNED INTERVENTIONS: [id number]- PT Re-evaluation, 97750- Physical Performance Testing, 97110-Therapeutic exercises, 97530- Therapeutic activity, 97112- Neuromuscular re-education, 97535- Self Care, [id number]- Manual therapy, [id number]- Gait training, [phone]- Aquatic Therapy, [phone]- Ionotophoresis 4mg /ml Dexamethasone , 79439 (1-2 muscles), 20561 (3+ muscles)- Dry Needling, Patient/Family education, Balance training, Stair training, Taping, Joint mobilization, DME instructions, Cryotherapy, and Moist heat.  PLAN FOR NEXT SESSION: aquatic: core and le strengthening, transfers, stair climbing, balance retraining  Land:  continue to progress balance and glute/quad strength, continue with more dynamic surfaces/activities, continue to work on back spasm/pain if its still present   Rojean Batten PT, DPT 02/22/2024  12:38 PM     "

## 2024-02-22 ENCOUNTER — Encounter (HOSPITAL_BASED_OUTPATIENT_CLINIC_OR_DEPARTMENT_OTHER): Payer: Self-pay | Admitting: Physical Therapy

## 2024-02-22 ENCOUNTER — Ambulatory Visit (HOSPITAL_BASED_OUTPATIENT_CLINIC_OR_DEPARTMENT_OTHER): Payer: Self-pay | Admitting: Physical Therapy

## 2024-02-22 DIAGNOSIS — M5459 Other low back pain: Secondary | ICD-10-CM

## 2024-02-22 DIAGNOSIS — R2681 Unsteadiness on feet: Secondary | ICD-10-CM

## 2024-02-22 DIAGNOSIS — M6281 Muscle weakness (generalized): Secondary | ICD-10-CM

## 2024-02-22 DIAGNOSIS — G8929 Other chronic pain: Secondary | ICD-10-CM

## 2024-02-26 ENCOUNTER — Ambulatory Visit (HOSPITAL_BASED_OUTPATIENT_CLINIC_OR_DEPARTMENT_OTHER): Payer: Self-pay | Attending: Sports Medicine | Admitting: Physical Therapy

## 2024-02-26 ENCOUNTER — Encounter (HOSPITAL_BASED_OUTPATIENT_CLINIC_OR_DEPARTMENT_OTHER): Payer: Self-pay | Admitting: Physical Therapy

## 2024-02-26 DIAGNOSIS — G8929 Other chronic pain: Secondary | ICD-10-CM | POA: Insufficient documentation

## 2024-02-26 DIAGNOSIS — M25561 Pain in right knee: Secondary | ICD-10-CM | POA: Diagnosis present

## 2024-02-26 DIAGNOSIS — M5459 Other low back pain: Secondary | ICD-10-CM | POA: Diagnosis present

## 2024-02-26 DIAGNOSIS — M6281 Muscle weakness (generalized): Secondary | ICD-10-CM | POA: Diagnosis present

## 2024-02-26 DIAGNOSIS — R2681 Unsteadiness on feet: Secondary | ICD-10-CM | POA: Insufficient documentation

## 2024-02-26 NOTE — Therapy (Signed)
 " OUTPATIENT PHYSICAL THERAPY THORACOLUMBAR TREATMENT Patient Name: [name] MRN: [id number] DOB:04-23-1940, 84 y.o., female Today's Date: 02/26/2024  END OF SESSION:  PT End of Session - 02/26/24 0807     Visit Number 24    Date for Recertification  03/18/24    Authorization Type aetna Mcr    Progress Note Due on Visit 29    PT Start Time 0758    PT Stop Time 0839    PT Time Calculation (min) 41 min    Activity Tolerance Patient tolerated treatment well    Behavior During Therapy [facility] for tasks assessed/performed           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   Hyperlipidemia    Impaired hearing    left ear, wears hearing aids   Insomnia    circadian rhythm component   Insomnia    Migraine 11/1986   Spillman   Nuclear sclerosis    OSA on CPAP 03/2005   uses cpap setting of 10   Osteoarthritis 2007   Deveschwar   Pain last week   left under breast pain    Plantar fasciitis    Rheumatic fever    Scarlet fever as child   Sleep apnea    Swallowing difficulty    Thyroid  disorder    Vitamin D  deficiency    Vitreous degeneration of right eye    Past Surgical History:  Procedure Laterality Date   BREAST BIOPSY Left 6/00   Hardcastle   BREAST EXCISIONAL BIOPSY Left 1998   DE QUERVAIN'S RELEASE Right 10/97   Sypher   left breast biopsy     ROOT CANAL  02/11/2012   TONSILLECTOMY  age 83   TONSILLECTOMY AND ADENOIDECTOMY  1952   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   TOTAL KNEE ARTHROPLASTY Right 11/08/2012   Procedure: RIGHT TOTAL KNEE ARTHROPLASTY;  Surgeon: Dempsey LULLA Moan, MD;  Location: [facility];  Service: Orthopedics;  Laterality: Right;   TUBAL LIGATION     Patient Active Problem List   Diagnosis Date Noted    Venous stasis of lower extremity 07/28/2023   Great toe pain, right 06/16/2023   Acute pain of right knee 05/28/2023   Closed compression fracture of body of first lumbar vertebra (HCC) 05/28/2023   Bilateral lower extremity edema 02/03/2022   Fluid retention 12/17/2021   Low back pain 10/03/2021   Atherosclerotic heart disease of native coronary artery without angina pectoris 05/27/2021   Diastolic dysfunction 05/27/2021   Neck pain 05/27/2021   Migraine 04/23/2021   Generalized obesity 04/23/2021   Depression 03/18/2021   Lumbar spine pain 08/30/2020   Flatulence, eructation and gas pain 05/07/2020   Change in bowel habit 05/07/2020   Gout 06/22/2019   Post viral syndrome 06/15/2019   Cervicalgia 06/15/2019   Chronic tension-type headache, intractable 06/15/2019   Fall 04/21/2018   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   Diverticulosis 12/23/2017   History of adenomatous polyp of colon 12/23/2017   Recurrent falls 12/14/2017   Degenerative disc disease, cervical 12/14/2017   Chronic constipation 12/14/2017   Floaters in visual field, bilateral 08/10/2017   Head injury consultation 08/10/2017   Arthritis of  carpometacarpal (CMC) joint of right thumb 05/14/2017   Therapeutic opioid-induced constipation (OIC) 02/05/2017   BMI 29.0-29.9,adult 02/05/2017   Dry mouth 02/05/2017   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/08/2017   Intolerance of continuous positive airway pressure (CPAP) ventilation 01/08/2017   Benign neoplasm of colon 11/11/2016   Other long term (current) drug therapy 11/11/2016   History of total knee arthroplasty, 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   Cough 03/06/2016   At risk for injury related to fall 02/14/2016   Primary osteoarthritis of both hands 02/04/2016   Osteopenia of multiple sites 02/04/2016   Metatarsalgia of both feet 02/04/2016   Migraines 02/04/2016   Other fatigue 02/01/2016   Melena 08/20/2015   Acute recurrent maxillary sinusitis 08/20/2015    Slow transit constipation 08/20/2015   Vitamin D  deficiency 08/20/2015   Thyroid  activity decreased 08/20/2015   Cephalalgia 08/20/2015   Preop cardiovascular exam 07/20/2015   Subacute ethmoidal sinusitis 12/11/2014   Chronic infection of sinus 07/12/2014   Nasal septal ulcer 05/25/2014   DJD (degenerative joint disease), cervical 03/22/2014   Right hand pain 03/22/2014   Deflected nasal septum 01/30/2014   Hypertrophy of nasal turbinates 01/30/2014   UARS (upper airway resistance syndrome) 10/04/2013   Insomnia secondary to depression with anxiety 10/04/2013   Exertional dyspnea 08/20/2013   History of rheumatic fever 08/20/2013   Hypomania (mild) single episode or unspecified 08/09/2013   OSA on CPAP 04/06/2013   Postoperative anemia due to acute blood loss 11/09/2012   OA (osteoarthritis) of knee 11/08/2012   History of giardia infection 07/15/2012   Atrophic vaginitis 07/15/2012   Vitreous degeneration of right eye    Nuclear sclerosis    Right shoulder pain 06/18/2011   Foot pain, left 06/18/2011   Hyperlipidemia 03/24/2011   Disorder of bone 02/12/2009   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   Migraine, unspecified, not intractable, without status migrainosus 02/12/2009    PCP: Garnette Ore MD  REFERRING PROVIDER: Helene Haddock MD  REFERRING DIAG:  [phone] (ICD-10-CM) - Acute pain of right knee  S32.010A (ICD-10-CM) - Closed compression fracture of body of L1 vertebra (HCC)    Rationale for Evaluation and Treatment: Rehabilitation  THERAPY DIAG:  Other low back pain  Muscle weakness (generalized)  Chronic pain of right knee  Unsteadiness on feet  ONSET DATE: March 2025  SUBJECTIVE:                                                                                                                                                                                           SUBJECTIVE STATEMENT: Pt reports she can now put her pants on in standing  position, which  is improvement from having to do it in seated position when starting therapy.  Pt is pleased with progress thus far.  Pt requests laminated aquatic HEP next visit.    EvalBETHA Rasmussen in June. Have had  2 compression fx.    2 falls in one day early march 1st compression fx then again in June sustaining a second.  Has a membership here at [facility]. I have been Walking in pool 4 x week for 45 mins to 1 hr. But I still hurt. I want to work on my balance and stand up from a chair without using my arms.  Standing up from a chair my right knee and back hurts. Using rollator when walking back to pool but not any other time  PERTINENT HISTORY:  Evaluate and treat for L1 compression fx and right knee pain. Include aquatic therapy in treatment plan.  Chronic fatigue; fibromyalgia  PAIN:  Are you having pain? yes: NPRS scale:7/10 Pain location: R side low back Pain description: very tight, sore to touch   Aggravating factors: not sure Relieving factors: rest. Voltaren  and heat   PRECAUTIONS: Fall  RED FLAGS: None   WEIGHT BEARING RESTRICTIONS: No  FALLS:  Has patient fallen in last 6 months? Yes. Number of falls 1 fall backwards after putting my foot on a chair to toe my shoe.  LIVING ENVIRONMENT: Lives with: lives with their spouse Lives in: House/apartment Has following equipment at home: Single point cane, Environmental Consultant - 2 wheeled, Environmental Consultant - 4 wheeled, and Grab bars  OCCUPATION: retired financial controller  PLOF: Independent  PATIENT GOALS: Improve balance, get in and out of chairs, car and bed better  NEXT MD VISIT: as needed  OBJECTIVE:  Note: Objective measures were completed at Evaluation unless otherwise noted.  DIAGNOSTIC FINDINGS:  7/25 CT Lumbar IMPRESSION: 1. Subacute compression deformities at T12 and L1 with loss of height of 10% at each level. Sclerotic change consistent with ongoing healing process. I cannot state by CT if there is complete healing. No retropulsed bone. 2.  L3-4: Mild disc bulge and facet osteoarthritis. No compressive stenosis. 3. L4-5: Mild disc bulge. Bilateral facet degeneration and hypertrophy. 1-2 mm of degenerative anterolisthesis. Mild narrowing of both lateral recesses, not grossly compressive. 4. L5-S1: Endplate osteophytes and bulging of the disc. Bilateral facet osteoarthritis worse on the right. No compressive stenosis.  05/29/23 Xray R knee IMPRESSION: 1. 9.3 x 3.9 x 3.7 cm heterogenous layering hypodense fluid collection most compatible with a hematoma extending along the superficial fascia of the right posterolateral thigh, overlying and compressing the biceps femoris musculature, which could represent a soft tissue degloving injury, such as a Morel-Lavallee lesion. This collection appears to continue below the level of the knee into the lateral gastrocnemius muscle, concerning for intramuscular hematoma. 2. Intact right total knee arthroplasty. No acute osseous abnormality. 3. Small knee joint effusion.  PATIENT SURVEYS:  ODI: 23/50=46%                       ODI 20/50 = 40% COGNITION: Overall cognitive status: Within functional limits for tasks assessed     SENSATION: WFL  MUSCLE LENGTH: Hamstrings: wfl   POSTURE: rounded shoulders, forward head, decreased thoracic kyphosis, and left pelvic obliquity  PALPATION: Right patella crepitus  LUMBAR ROM:   AROM eval 12/29/23 02/08/24  Flexion full    Extension     Right lateral flexion 90% limited P! 50% limited P! 40% limited P!  Left lateral flexion 90% limited P! 50% limited P! 75% limited P!  Right rotation     Left rotation      (Blank rows = not tested)  LOWER EXTREMITY ROM:     Active  Right eval Left eval  Hip flexion    Hip extension    Hip abduction    Hip adduction    Hip internal rotation    Hip external rotation    Knee flexion 110 128  Knee extension 0 0  Ankle dorsiflexion    Ankle plantarflexion    Ankle inversion    Ankle  eversion     (Blank rows = not tested)  LOWER EXTREMITY MMT:    MMT Right eval Left eval R / L 12/29/23 R 01/25/24 L 01/25/24 R 02/08/24 L 02/08/24  Hip flexion 3 3 3+ / 3+ 3 3+ 4 4  Hip extension         Hip abduction 4+ 4+ 5- / 5- 4- sidelying  4+ sidelying  4 4  Hip abd 4- 4- 5 /5      Hip internal rotation         Hip external rotation         Knee flexion 4 5 5 5 5  4+ 4+  Knee extension 3+ 4 4 4+ 4+ 4+ 4+  Ankle dorsiflexion         Ankle plantarflexion         Ankle inversion         Ankle eversion          (Blank rows = not tested)  LUMBAR SPECIAL TESTS:  Slump test: Negative  FUNCTIONAL TESTS:  30 seconds chair stand test Timed up and go (TUG): 21.24   02/08/24 TUG 13.5 seconds no device  30 second chair stand test x9 reps using UEs on chair       Item Test date: 11/05/23 Date: 12/29/23 Date: 01/25/24  Sitting to standing 2. able to stand using hands after several tries 3. able to stand independently using hands  4. able to stand safely for 2 minutes  4. able to sit safely and securely for 2 minutes   3. controls descent by using hands  3. able to transfer safely with definite need of hands  4. able to stand 10 seconds safely  3. able to place feet together independently and stand 1 minute with supervision  4. can reach forward confidently 25 cm (10 inches)    2. unable to pick up but reaches 2-5 cm (1-2 inches) from slipper and keeps balance independently  3. looks behind one side only, other side shows less weight shift    1. needs close supervision or verbal cuing  0. needs assistance to keep from falling/unable to try    0. loses balance while stepping or standing   0. unable to try of needs assist to prevent fall     Insert SmartPhrase OPRCBERGREEVAL  2. Standing unsupported 4. able to stand safely for 2 minutes    3. Sitting with back /unsupported, feet supported 4. able to sit safely and securely for 2 minutes    4. Standing to sitting 2.  uses back of legs against chair to control descent    5. Pivot transfer  3. able to transfer safely with definite need of hands    6. Standing unsupported with eyes closed 2. able to stand 3 seconds    7. Standing unsupported with feet together 1. needs  help to attain position but able to stand 15 seconds feet together    8. Reaching forward with outstretched arms while standing 2. can reach forward 5 cm (2 inches)    9. Pick up object from the floor from standing 3. able to pick up slipper but needs supervision    10. Turning to look behind over left and right shoulders while standing 3. looks behind one side only, other side shows less weight shift    11. Turn 360 degrees 1. needs close supervision or verbal cuing    12. Place alternate foot on step or stool while standing unsupported 0. needs assistance to keep from falling/unable to try    13. Standing unsupported one foot in front 0. loses balance while stepping or standing    14. Standing on one leg 0. unable to try of needs assist to prevent fall      Total Score 27/56 Total Score:   34/56 Total Score: 43/56       01/25/24   Standardized Balance Assessment  Standardized Balance Assessment Berg Balance Test  Berg Balance Test  Sit to Stand 4  Standing Unsupported 4  Sitting with Back Unsupported but Feet Supported on Floor or Stool 4  Stand to Sit 4  Transfers 4  Standing Unsupported with Eyes Closed 3  Standing Unsupported with Feet Together 3  From Standing, Reach Forward with Outstretched Arm 3  From Standing Position, Pick up Object from Floor 3  From Standing Position, Turn to Look Behind Over each Shoulder 3  Turn 360 Degrees 2  Standing Unsupported, Alternately Place Feet on Step/Stool 3  Standing Unsupported, One Foot in Front 0  Standing on One Leg 3  Total Score 43        GAIT: Distance walked: 400 ft Assistive device utilized: rollator Level of assistance: Complete Independence Comments: quickened short  step length, reduced hip/knee flex, little arm swing  TREATMENT  [facility] Adult PT Treatment:    02/26/24 Pt seen for aquatic therapy today.  Treatment took place in water  3.5-4.75 ft in depth at the [facility] pool. Temp of water  was 91.  Pt entered/exited the pool via stairs with bil rail.  -warm up of walking forward/ backward, unsupported, multiple laps - suitcase carry with single yellow hand float under water  at side, walking backward / forward - L stretch at wall - wall push up and off, x 12 - minor cues for form - SLS with opposite hand holding hollow noodle under water  at side - L stretch at wall - tandem gait without support (difficult)/ with yellow hand floats (improved) -side stepping into wide squat with UE add/abd with rainbow hand floats -UE on wall: hip abdct/ add x 10; single leg clams x 10 each - STS on 3rd step x 8 reps with SBA (improved form with repetition and VC)  02/22/24 Nustep L4x8 minutes seat 9 all LE extremities  LAQ 2x10  Seated marching 2x10  Supine LTR x20  Supine SLR x10  Supine marching  Sit to stand with momentum strategy   Discussed coming in on weekends to walk in the water  Discussion about importance of    02/19/24 Pt seen for aquatic therapy today.  Treatment took place in water  3.5-4.75 ft in depth at the [facility] pool. Temp of water  was 91.  Pt entered/exited the pool via stairs with bil rail.  -walking forward/ backward, unsupported, multiple laps, cues for even step height, neutral Rt foot -side stepping with  UE add/abd with rainbow hand floats -> into wide squat with arm add/abdct -in 8ft 6 tandem gait forward/ backward, UE on rainbow hand floats (big balance challenge) - L stretch at wall - return to walking forward/ backward with reciprocal arm swing - forward step with single row with  medium resistance bell, and return to neutral x 10 each side (balance challenge)  -UE on wall: hip abdct/ add x 10; single  leg clams x 10 each - wall push up/off x10 - STS on 3rd step x 3 reps with SBA (improved form with repetition)  02/15/24:                                           Nustep L6x8 minutes seat 10 all four extremities  Seated LAQ Seated marching  1 lap around clinic with focus on big steps Sit to stands from hi -low table  Toe tap to cone in center to work on hip flexion and ER for putting on pants Stepping over tape on floor in square to help with big steps in lateral/ fwd/ bkwd directions Assessment of coordination on LE Discussion about parkinson type symptoms.    02/12/24 Pt seen for aquatic therapy today.  Treatment took place in water  3.5-4.75 ft in depth at the [facility] pool. Temp of water  was 91.  Pt entered/exited the pool via stairs with bil rail.  -walking forward and backward, unsupported, multiple laps, cues for even step length and vertical trunk -side stepping with ue horizontal add/abd with rainbow hand floats -> abdct/add with rainbow hand floats - suitcase carry with single rainbow hand float, walking forward/backward  -Bow and Arrow (difficulty coordinating). Focused on weight shift back/forth. - forward step with single row with light resistance bell, and return to neutral x 8 each side - L stretch with UE on wall x 3 reps of 10-15 sec - return to walking forward/ backward  -SLS in 4+ ft with shoulder add/abdct with short hollow noodles x 5 eac h(increased back pain) - repeated L stretch at wall - tandem gait forward/ backward, hands on top of water  (challenge) - STS on 3rd step x 3 reps with SBA (improved form with repetition)  02/08/24  Nustep L6x8 minutes seat 10 all four extremities   ODI, MMT, TUG, 30 second chair stand test, lumbar ROM, goals  Tandem stance blue foam 6x30 seconds alternating SLS x15 seconds B alternating   02/05/24 Pt seen for aquatic therapy today.  Treatment took place in water  3.5-4.75 ft in depth at the [facility] pool. Temp of water  was 91.  Pt entered/exited the pool via stairs with bil rail.  -walking forward and back -L stretch->hip hiking -side stepping with ue horizontal add/abd -wide stance ue horizontal add/abd -Bow and Arrow (difficulty coordinating). Focused on weight shift back/forth. -STS from water  bench onto step unsupported 2x 10.  VC and demonstration for execution. Slowed descent.  Good execution no LOB -figure 4 stretch at steps (Not tolerated well) -open book R/L x 5  02/01/24  Nustep L6x8 minutes seat 10 all four extremities   Lumbar rotation stretch 5x5 seconds B  SKTC 5x5 seconds B Figure 4 stretch B 2x30 seconds Thoracic flexion/extension AROM x10   Tandem stance blue foam 6x30 seconds alternating  Sidesteps on blue foam pad x3 laps   01/25/24  Nustep L6x8 minutes seat 10 all four  extremities  MMT, Berg, goals, education on progress with PT  Tandem stance blue foam pad 6x30 seconds alternating Standing marches/tapping top of 6 inch box blue foam pad x20  Forward and lateral steps over hurdles at bar x2 laps Lateral steps over hurdles x1 lap at bar, got light headed- BP 133/72 HR 76, dizziness resolved before we checked SpO2    PATIENT EDUCATION:  Education details: intro to aquatic therapy   Person educated: Patient Education method: Explanation, demonstration Education comprehension: verbalized understanding  HOME EXERCISE PROGRAM:  Access Code: [email or web address] URL: [email or web address] Date: 02/01/2024 Prepared by: Josette Rough  Exercises - Theracane Over Shoulder  - 1 x daily - 7 x weekly - 3 sets - 10 reps - Seated Bilateral Shoulder Flexion Towel Slide at Table Top  - 1 x daily - 7 x weekly - 3 sets - 10 reps - Sit to Stand with Arms Crossed  - 2 x daily - 7 x weekly - 2 sets - 10 reps - Seated Hip Abduction with Resistance  - 1 x daily - 7 x weekly - 3 sets - 10 reps - Seated Long Arc Quad  - 1 x daily - 7 x weekly - 3 sets  - 10 reps - Standing March with Counter Support  - 1 x daily - 7 x weekly - 3 sets - 10 reps - Standing Hip Abduction with Counter Support  - 1 x daily - 7 x weekly - 3 sets - 10 reps - Standing Hip Extension with Counter Support  - 1 x daily - 7 x weekly - 3 sets - 10 reps - Narrow Stance with Counter Support  - 1 x daily - 7 x weekly - 3 sets - 10 reps - Standing Tandem Balance with Counter Support  - 1 x daily - 7 x weekly - 3 sets - 10 reps - Supine Lower Trunk Rotation  - 1 x daily - 7 x weekly - 1-2 sets - 10 reps - 5 seconds  hold - Supine Single Knee to Chest Stretch  - 1 x daily - 7 x weekly - 1-2 sets - 10 reps - 5 seconds  hold - Supine Figure 4 Piriformis Stretch  - 1 x daily - 7 x weekly - 1-2 sets - 6 reps - 30 seconds  hold - Seated Thoracic Flexion and Extension  - 1 x daily - 7 x weekly - 1-2 sets - 10 reps   ASSESSMENT:  CLINICAL IMPRESSION: Pt continues to be challenged with narrow base of support exercises- minor LOB with tandem gait when Lt foot in back or SLS during transition; able to recovery without difficulty. Some increase in back pain with SLS and NBOS exercises, eased with L stretch at pool wall. Pain in back decreased to 4/10 during session.  Will plan to issue laminated aquatic HEP next visit, after given instruction on it.      PN #2- patient continues to have back pain but she has made great progress in terms of functional strength, balance, and overall mobility. She does however remain a bit of a high fall risk and also demonstrates impaired lumbar ROM as well as impaired functional activity tolerance. Feel that she would benefit from skilled PT services in order to continue fine tuning all remaining impairments/working toward goals yet unmet, also to establish appropriate long term independent exercise programming prior to possible DC at end of current cert/scheduled visits.      OBJECTIVE IMPAIRMENTS: Abnormal gait, decreased activity  tolerance, decreased  balance, decreased knowledge of use of DME, decreased mobility, difficulty walking, decreased ROM, decreased strength, postural dysfunction, obesity, and pain.   ACTIVITY LIMITATIONS: carrying, lifting, bending, sitting, standing, squatting, stairs, and transfers  PARTICIPATION LIMITATIONS: meal prep, cleaning, laundry, driving, shopping, and community activity  PERSONAL FACTORS: Age and 1-2 comorbidities: see PmHx are also affecting patient's functional outcome.   REHAB POTENTIAL: Good  CLINICAL DECISION MAKING: Evolving/moderate complexity  EVALUATION COMPLEXITY: Moderate   GOALS: Goals reviewed with patient? Yes  SHORT TERM GOALS: Target date: 12/14/23 (delay in beginning therapy)  Pt will tolerate full aquatic sessions consistently without increase in pain and with improving function to demonstrate good toleration and effectiveness of intervention.  Baseline: Goal status: Met 12/29/23  2.  Pt will tolerate stair climbing in and out of pool using approp pattern ascending and descending 6 steps with use of handrail  Baseline:  Goal status: Met 12/29/23  3.  Pt will perform 10 STS from water  bench onto water  step unsupported without LOB Baseline:  Goal status:  Met 12/29/23  4.  Pt and cg will consider grab bars for commode  Baseline:  Goal status: deferred as pt reports she is rising off of a raised commode without difficulty 12/29/23  5.  Pt will be indep and compliant with initial land based HEP for improving strength and safety at home Baseline:  Goal status: Met 12/29/23    LONG TERM GOALS: Target date: 03/19/23  Pt to improve on ODI by 13% to demonstrate statistically significant Improvement in function. (MCID 13-15%) Baseline: 23/50=46% Goal status: ONGOING 02/08/24   2.  Pt will improve strength in bil hip flex and right knee ext by 1 grade to demonstrate improved overall physical function Baseline: see chart Goal status:  MET 02/08/24  3.  Pt will improve on  Berg balance test to >/= 35/56 to demonstrate a decrease in fall risk. (MCD 7) Baseline:27/56 (medium fall risk) ; 34/56 Goal status: MET 01/25/24 see above   4.  Pt will improve on 30s STS test  from standard chair from 3 to 6 to demonstrate improving functional lower extremity strength, transitional movements, and balance. (MDC = 4.2sec)  Baseline: 3 Goal status: MET 02/08/24   5.  Pt will report decrease in pain by at least 50% for improved toleration to activity/quality of life and to demonstrate improved management of pain. Baseline: see chart Goal status: ONGOING 02/08/24   6.  Pt will be indep with final HEP's (land and aquatic as appropriate) for continued management of condition Baseline: see chart Goal status: ONGOING 02/08/24   PLAN:  PT FREQUENCY: 2x/week  PT DURATION: 10 w(extended out due to holiday schedule) alternate land/aquatics  PLANNED INTERVENTIONS: 97164- PT Re-evaluation, 97750- Physical Performance Testing, 97110-Therapeutic exercises, 97530- Therapeutic activity, 97112- Neuromuscular re-education, 97535- Self Care, [id number]- Manual therapy, [id number]- Gait training, [phone]- Aquatic Therapy, [phone]- Ionotophoresis 4mg /ml Dexamethasone , 79439 (1-2 muscles), 20561 (3+ muscles)- Dry Needling, Patient/Family education, Balance training, Stair training, Taping, Joint mobilization, DME instructions, Cryotherapy, and Moist heat.  PLAN FOR NEXT SESSION: aquatic: instruct on HEP and issue laminated copy  Land:  continue to progress balance and glute/quad strength, continue with more dynamic surfaces/activities, continue to work on back spasm/pain if its still present   Delon Aquas, PTA 02/26/2024 8:44 AM [facility] [address] [place], [place], [address] Phone: [phone]   Fax:  [phone]  "

## 2024-02-27 NOTE — Therapy (Unsigned)
 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   Hyperlipidemia    Impaired hearing    left ear, wears hearing aids   Insomnia    circadian rhythm component   Insomnia    Migraine 11/1986   Spillman   Nuclear sclerosis    OSA on CPAP 03/2005   uses cpap setting of 10   Osteoarthritis 2007   Deveschwar   Pain last week   left under breast pain    Plantar fasciitis    Rheumatic fever    Scarlet fever as child   Sleep apnea    Swallowing difficulty    Thyroid  disorder    Vitamin D  deficiency    Vitreous degeneration of right eye    Past Surgical History:  Procedure Laterality Date   BREAST BIOPSY Left 6/00   [place]   BREAST EXCISIONAL BIOPSY Left 1998   DE QUERVAIN'S RELEASE Right 10/97   Sypher   left breast biopsy     ROOT CANAL  02/11/2012   TONSILLECTOMY  age 15   TONSILLECTOMY AND ADENOIDECTOMY  1952   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   TOTAL KNEE ARTHROPLASTY Right 11/08/2012   Procedure: RIGHT TOTAL KNEE ARTHROPLASTY;  Surgeon: Dempsey LULLA Moan, MD;  Location: [facility];  Service: Orthopedics;  Laterality: Right;   TUBAL LIGATION     Patient Active Problem List   Diagnosis Date Noted   Venous stasis of lower extremity 07/28/2023   Great toe pain, right 06/16/2023   Acute pain of right knee 05/28/2023   Closed compression fracture of body of first lumbar vertebra (HCC) 05/28/2023   Bilateral lower extremity edema 02/03/2022   Fluid retention 12/17/2021   Low back pain 10/03/2021   Atherosclerotic heart disease of native coronary artery without  angina pectoris 05/27/2021   Diastolic dysfunction 05/27/2021   Neck pain 05/27/2021   Migraine 04/23/2021   Generalized obesity 04/23/2021   Depression 03/18/2021   Lumbar spine pain 08/30/2020   Flatulence, eructation and gas pain 05/07/2020   Change in bowel habit 05/07/2020   Gout 06/22/2019   Post viral syndrome 06/15/2019   Cervicalgia 06/15/2019   Chronic tension-type headache, intractable 06/15/2019   Fall 04/21/2018   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   Diverticulosis 12/23/2017   History of adenomatous polyp of colon 12/23/2017   Recurrent falls 12/14/2017   Degenerative disc disease, cervical 12/14/2017   Chronic constipation 12/14/2017   Floaters in visual field, bilateral 08/10/2017   Head injury consultation 08/10/2017   Arthritis of carpometacarpal [facility]) joint of right thumb 05/14/2017   Therapeutic opioid-induced constipation (OIC) 02/05/2017   BMI 29.0-29.9,adult 02/05/2017   Dry mouth 02/05/2017   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/08/2017   Intolerance of continuous positive airway pressure (CPAP) ventilation 01/08/2017   Benign neoplasm of colon 11/11/2016   Other long term (current) drug therapy 11/11/2016   History of total knee arthroplasty, 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   Cough 03/06/2016   At risk for injury related to fall 02/14/2016   Primary  osteoarthritis of both hands 02/04/2016   Osteopenia of multiple sites 02/04/2016   Metatarsalgia of both feet 02/04/2016   Migraines 02/04/2016   Other fatigue 02/01/2016   Melena 08/20/2015   Acute recurrent maxillary sinusitis 08/20/2015   Slow transit constipation 08/20/2015   Vitamin D  deficiency 08/20/2015   Thyroid  activity decreased 08/20/2015   Cephalalgia 08/20/2015   Preop cardiovascular exam 07/20/2015   Subacute ethmoidal sinusitis 12/11/2014   Chronic infection of sinus 07/12/2014   Nasal septal ulcer 05/25/2014   DJD (degenerative joint disease), cervical 03/22/2014   Right hand  pain 03/22/2014   Deflected nasal septum 01/30/2014   Hypertrophy of nasal turbinates 01/30/2014   UARS (upper airway resistance syndrome) 10/04/2013   Insomnia secondary to depression with anxiety 10/04/2013   Exertional dyspnea 08/20/2013   History of rheumatic fever 08/20/2013   Hypomania (mild) single episode or unspecified 08/09/2013   OSA on CPAP 04/06/2013   Postoperative anemia due to acute blood loss 11/09/2012   OA (osteoarthritis) of knee 11/08/2012   History of giardia infection 07/15/2012   Atrophic vaginitis 07/15/2012   Vitreous degeneration of right eye    Nuclear sclerosis    Right shoulder pain 06/18/2011   Foot pain, left 06/18/2011   Hyperlipidemia 03/24/2011   Disorder of bone 02/12/2009   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  ONSET DATE: March 2025  SUBJECTIVE:                                                                                                                                                                                           SUBJECTIVE STATEMENT: Pt reports she can now put her pants on in standing position, which is improvement from having to do it in seated position when starting therapy.  Pt is pleased with progress thus far.  Pt requests laminated aquatic HEP next visit.    EvalBETHA Green in June. Have had  2 compression fx.    2 falls in one day early march 1st compression fx then again in June sustaining a second.  Has a membership here at [facility]. I have been Walking in pool 4 x week for 45 mins to 1 hr. But I still hurt. I want to work on my balance  and stand up  from a chair without using my arms.  Standing up from a chair my right knee and back hurts. Using rollator when walking back to pool but not any other time  PERTINENT HISTORY:  Evaluate and treat for L1 compression fx and right knee pain. Include aquatic therapy in treatment plan.  Chronic fatigue; fibromyalgia  PAIN:  Are you having pain? yes: NPRS scale:7/10 Pain location: R side low back Pain description: very tight, sore to touch   Aggravating factors: not sure Relieving factors: rest. Voltaren  and heat   PRECAUTIONS: Fall  RED FLAGS: None   WEIGHT BEARING RESTRICTIONS: No  FALLS:  Has patient fallen in last 6 months? Yes. Number of falls 1 fall backwards after putting my foot on a chair to toe my shoe.  LIVING ENVIRONMENT: Lives with: lives with their spouse Lives in: House/apartment Has following equipment at home: Single point cane, Environmental Consultant - 2 wheeled, Environmental Consultant - 4 wheeled, and Grab bars  OCCUPATION: retired financial controller  PLOF: Independent  PATIENT GOALS: Improve balance, get in and out of chairs, car and bed better  NEXT MD VISIT: as needed  OBJECTIVE:  Note: Objective measures were completed at Evaluation unless otherwise noted.  DIAGNOSTIC FINDINGS:  7/25 CT Lumbar IMPRESSION: 1. Subacute compression deformities at T12 and L1 with loss of height of 10% at each level. Sclerotic change consistent with ongoing healing process. I cannot state by CT if there is complete healing. No retropulsed bone. 2. L3-4: Mild disc bulge and facet osteoarthritis. No compressive stenosis. 3. L4-5: Mild disc bulge. Bilateral facet degeneration and hypertrophy. 1-2 mm of degenerative anterolisthesis. Mild narrowing of both lateral recesses, not grossly compressive. 4. L5-S1: Endplate osteophytes and bulging of the disc. Bilateral facet osteoarthritis worse on the right. No compressive stenosis.  05/29/23 Xray R knee IMPRESSION: 1. 9.3 x 3.9 x 3.7 cm  heterogenous layering hypodense fluid collection most compatible with a hematoma extending along the superficial fascia of the right posterolateral thigh, overlying and compressing the biceps femoris musculature, which could represent a soft tissue degloving injury, such as a Morel-Lavallee lesion. This collection appears to continue below the level of the knee into the lateral gastrocnemius muscle, concerning for intramuscular hematoma. 2. Intact right total knee arthroplasty. No acute osseous abnormality. 3. Small knee joint effusion.  PATIENT SURVEYS:  ODI: 23/50=46%                       ODI 20/50 = 40% COGNITION: Overall cognitive status: Within functional limits for tasks assessed     SENSATION: WFL  MUSCLE LENGTH: Hamstrings: wfl   POSTURE: rounded shoulders, forward head, decreased thoracic kyphosis, and left pelvic obliquity  PALPATION: Right patella crepitus  LUMBAR ROM:   AROM eval 12/29/23 02/08/24  Flexion full    Extension     Right lateral flexion 90% limited P! 50% limited P! 40% limited P!   Left lateral flexion 90% limited P! 50% limited P! 75% limited P!  Right rotation     Left rotation      (Blank rows = not tested)  LOWER EXTREMITY ROM:     Active  Right eval Left eval  Hip flexion    Hip extension    Hip abduction    Hip adduction    Hip internal rotation    Hip external rotation    Knee flexion 110 128  Knee extension 0 0  Ankle dorsiflexion    Ankle plantarflexion    Ankle inversion  Ankle eversion     (Blank rows = not tested)  LOWER EXTREMITY MMT:    MMT Right eval Left eval R / L 12/29/23 R 01/25/24 L 01/25/24 R 02/08/24 L 02/08/24  Hip flexion 3 3 3+ / 3+ 3 3+ 4 4  Hip extension         Hip abduction 4+ 4+ 5- / 5- 4- sidelying  4+ sidelying  4 4  Hip abd 4- 4- 5 /5      Hip internal rotation         Hip external rotation         Knee flexion 4 5 5 5 5  4+ 4+  Knee extension 3+ 4 4 4+ 4+ 4+ 4+  Ankle dorsiflexion          Ankle plantarflexion         Ankle inversion         Ankle eversion          (Blank rows = not tested)  LUMBAR SPECIAL TESTS:  Slump test: Negative  FUNCTIONAL TESTS:  30 seconds chair stand test Timed up and go (TUG): 21.24   02/08/24 TUG 13.5 seconds no device  30 second chair stand test x9 reps using UEs on chair       Item Test date: 11/05/23 Date: 12/29/23 Date: 01/25/24  Sitting to standing 2. able to stand using hands after several tries 3. able to stand independently using hands  4. able to stand safely for 2 minutes  4. able to sit safely and securely for 2 minutes   3. controls descent by using hands  3. able to transfer safely with definite need of hands  4. able to stand 10 seconds safely  3. able to place feet together independently and stand 1 minute with supervision  4. can reach forward confidently 25 cm (10 inches)    2. unable to pick up but reaches 2-5 cm (1-2 inches) from slipper and keeps balance independently  3. looks behind one side only, other side shows less weight shift    1. needs close supervision or verbal cuing  0. needs assistance to keep from falling/unable to try    0. loses balance while stepping or standing   0. unable to try of needs assist to prevent fall     Insert SmartPhrase OPRCBERGREEVAL  2. Standing unsupported 4. able to stand safely for 2 minutes    3. Sitting with back /unsupported, feet supported 4. able to sit safely and securely for 2 minutes    4. Standing to sitting 2. uses back of legs against chair to control descent    5. Pivot transfer  3. able to transfer safely with definite need of hands    6. Standing unsupported with eyes closed 2. able to stand 3 seconds    7. Standing unsupported with feet together 1. needs help to attain position but able to stand 15 seconds feet together    8. Reaching forward with outstretched arms while standing 2. can reach forward 5 cm (2 inches)    9. Pick up object from the  floor from standing 3. able to pick up slipper but needs supervision    10. Turning to look behind over left and right shoulders while standing 3. looks behind one side only, other side shows less weight shift    11. Turn 360 degrees 1. needs close supervision or verbal cuing    12. Place alternate foot on step or  stool while standing unsupported 0. needs assistance to keep from falling/unable to try    13. Standing unsupported one foot in front 0. loses balance while stepping or standing    14. Standing on one leg 0. unable to try of needs assist to prevent fall      Total Score 27/56 Total Score:   34/56 Total Score: 43/56       01/25/24   Standardized Balance Assessment  Standardized Balance Assessment Berg Balance Test  Berg Balance Test  Sit to Stand 4  Standing Unsupported 4  Sitting with Back Unsupported but Feet Supported on Floor or Stool 4  Stand to Sit 4  Transfers 4  Standing Unsupported with Eyes Closed 3  Standing Unsupported with Feet Together 3  From Standing, Reach Forward with Outstretched Arm 3  From Standing Position, Pick up Object from Floor 3  From Standing Position, Turn to Look Behind Over each Shoulder 3  Turn 360 Degrees 2  Standing Unsupported, Alternately Place Feet on Step/Stool 3  Standing Unsupported, One Foot in Front 0  Standing on One Leg 3  Total Score 43        GAIT: Distance walked: 400 ft Assistive device utilized: rollator Level of assistance: Complete Independence Comments: quickened short step length, reduced hip/knee flex, little arm swing  TREATMENT  [facility] Adult PT Treatment:    02/26/24 Pt seen for aquatic therapy today.  Treatment took place in water  3.5-4.75 ft in depth at the [facility] pool. Temp of water  was 91.  Pt entered/exited the pool via stairs with bil rail.  -warm up of walking forward/ backward, unsupported, multiple laps - suitcase carry with single yellow hand float under water  at side, walking  backward / forward - L stretch at wall - wall push up and off, x 12 - minor cues for form - SLS with opposite hand holding hollow noodle under water  at side - L stretch at wall - tandem gait without support (difficult)/ with yellow hand floats (improved) -side stepping into wide squat with UE add/abd with rainbow hand floats -UE on wall: hip abdct/ add x 10; single leg clams x 10 each - STS on 3rd step x 8 reps with SBA (improved form with repetition and VC)  02/22/24 Nustep L4x8 minutes seat 9 all LE extremities  LAQ 2x10  Seated marching 2x10  Supine LTR x20  Supine SLR x10  Supine marching  Sit to stand with momentum strategy   Discussed coming in on weekends to walk in the water  Discussion about importance of    02/19/24 Pt seen for aquatic therapy today.  Treatment took place in water  3.5-4.75 ft in depth at the [facility] pool. Temp of water  was 91.  Pt entered/exited the pool via stairs with bil rail.  -walking forward/ backward, unsupported, multiple laps, cues for even step height, neutral Rt foot -side stepping with UE add/abd with rainbow hand floats -> into wide squat with arm add/abdct -in 38ft 6 tandem gait forward/ backward, UE on rainbow hand floats (big balance challenge) - L stretch at wall - return to walking forward/ backward with reciprocal arm swing - forward step with single row with  medium resistance bell, and return to neutral x 10 each side (balance challenge)  -UE on wall: hip abdct/ add x 10; single leg clams x 10 each - wall push up/off x10 - STS on 3rd step x 3 reps with SBA (improved form with repetition)  02/15/24:  Nustep L6x8 minutes seat 10 all four extremities  Seated LAQ Seated marching  1 lap around clinic with focus on big steps Sit to stands from hi -low table  Toe tap to cone in center to work on hip flexion and ER for putting on pants Stepping over tape on floor in square to  help with big steps in lateral/ fwd/ bkwd directions Assessment of coordination on LE Discussion about parkinson type symptoms.    02/12/24 Pt seen for aquatic therapy today.  Treatment took place in water  3.5-4.75 ft in depth at the [facility] pool. Temp of water  was 91.  Pt entered/exited the pool via stairs with bil rail.  -walking forward and backward, unsupported, multiple laps, cues for even step length and vertical trunk -side stepping with ue horizontal add/abd with rainbow hand floats -> abdct/add with rainbow hand floats - suitcase carry with single rainbow hand float, walking forward/backward  -Bow and Arrow (difficulty coordinating). Focused on weight shift back/forth. - forward step with single row with light resistance bell, and return to neutral x 8 each side - L stretch with UE on wall x 3 reps of 10-15 sec - return to walking forward/ backward  -SLS in 4+ ft with shoulder add/abdct with short hollow noodles x 5 eac h(increased back pain) - repeated L stretch at wall - tandem gait forward/ backward, hands on top of water  (challenge) - STS on 3rd step x 3 reps with SBA (improved form with repetition)  02/08/24  Nustep L6x8 minutes seat 10 all four extremities   ODI, MMT, TUG, 30 second chair stand test, lumbar ROM, goals  Tandem stance blue foam 6x30 seconds alternating SLS x15 seconds B alternating   02/05/24 Pt seen for aquatic therapy today.  Treatment took place in water  3.5-4.75 ft in depth at the [facility] pool. Temp of water  was 91.  Pt entered/exited the pool via stairs with bil rail.  -walking forward and back -L stretch->hip hiking -side stepping with ue horizontal add/abd -wide stance ue horizontal add/abd -Bow and Arrow (difficulty coordinating). Focused on weight shift back/forth. -STS from water  bench onto step unsupported 2x 10.  VC and demonstration for execution. Slowed descent.  Good execution no LOB -figure 4 stretch at  steps (Not tolerated well) -open book R/L x 5  02/01/24  Nustep L6x8 minutes seat 10 all four extremities   Lumbar rotation stretch 5x5 seconds B  SKTC 5x5 seconds B Figure 4 stretch B 2x30 seconds Thoracic flexion/extension AROM x10   Tandem stance blue foam 6x30 seconds alternating  Sidesteps on blue foam pad x3 laps   01/25/24  Nustep L6x8 minutes seat 10 all four extremities  MMT, Berg, goals, education on progress with PT  Tandem stance blue foam pad 6x30 seconds alternating Standing marches/tapping top of 6 inch box blue foam pad x20  Forward and lateral steps over hurdles at bar x2 laps Lateral steps over hurdles x1 lap at bar, got light headed- BP 133/72 HR 76, dizziness resolved before we checked SpO2    PATIENT EDUCATION:  Education details: intro to aquatic therapy   Person educated: Patient Education method: Explanation, demonstration Education comprehension: verbalized understanding  HOME EXERCISE PROGRAM:  Access Code: [email or web address] URL: [email or web address] Date: 02/01/2024 Prepared by: Josette Rough  Exercises - Theracane Over Shoulder  - 1 x daily - 7 x weekly - 3 sets - 10 reps - Seated Bilateral Shoulder Flexion Towel Slide at Table Top  - 1 x daily - 7 x  weekly - 3 sets - 10 reps - Sit to Stand with Arms Crossed  - 2 x daily - 7 x weekly - 2 sets - 10 reps - Seated Hip Abduction with Resistance  - 1 x daily - 7 x weekly - 3 sets - 10 reps - Seated Long Arc Quad  - 1 x daily - 7 x weekly - 3 sets - 10 reps - Standing March with Counter Support  - 1 x daily - 7 x weekly - 3 sets - 10 reps - Standing Hip Abduction with Counter Support  - 1 x daily - 7 x weekly - 3 sets - 10 reps - Standing Hip Extension with Counter Support  - 1 x daily - 7 x weekly - 3 sets - 10 reps - Narrow Stance with Counter Support  - 1 x daily - 7 x weekly - 3 sets - 10 reps - Standing Tandem Balance with Counter Support  - 1 x daily - 7 x weekly - 3 sets - 10  reps - Supine Lower Trunk Rotation  - 1 x daily - 7 x weekly - 1-2 sets - 10 reps - 5 seconds  hold - Supine Single Knee to Chest Stretch  - 1 x daily - 7 x weekly - 1-2 sets - 10 reps - 5 seconds  hold - Supine Figure 4 Piriformis Stretch  - 1 x daily - 7 x weekly - 1-2 sets - 6 reps - 30 seconds  hold - Seated Thoracic Flexion and Extension  - 1 x daily - 7 x weekly - 1-2 sets - 10 reps   ASSESSMENT:  CLINICAL IMPRESSION: Pt continues to be challenged with narrow base of support exercises- minor LOB with tandem gait when Lt foot in back or SLS during transition; able to recovery without difficulty. Some increase in back pain with SLS and NBOS exercises, eased with L stretch at pool wall. Pain in back decreased to 4/10 during session.  Will plan to issue laminated aquatic HEP next visit, after given instruction on it.      PN #2- patient continues to have back pain but she has made great progress in terms of functional strength, balance, and overall mobility. She does however remain a bit of a high fall risk and also demonstrates impaired lumbar ROM as well as impaired functional activity tolerance. Feel that she would benefit from skilled PT services in order to continue fine tuning all remaining impairments/working toward goals yet unmet, also to establish appropriate long term independent exercise programming prior to possible DC at end of current cert/scheduled visits.      OBJECTIVE IMPAIRMENTS: Abnormal gait, decreased activity tolerance, decreased balance, decreased knowledge of use of DME, decreased mobility, difficulty walking, decreased ROM, decreased strength, postural dysfunction, obesity, and pain.   ACTIVITY LIMITATIONS: carrying, lifting, bending, sitting, standing, squatting, stairs, and transfers  PARTICIPATION LIMITATIONS: meal prep, cleaning, laundry, driving, shopping, and community activity  PERSONAL FACTORS: Age and 1-2 comorbidities: see PmHx are also affecting  patient's functional outcome.   REHAB POTENTIAL: Good  CLINICAL DECISION MAKING: Evolving/moderate complexity  EVALUATION COMPLEXITY: Moderate   GOALS: Goals reviewed with patient? Yes  SHORT TERM GOALS: Target date: 12/14/23 (delay in beginning therapy)  Pt will tolerate full aquatic sessions consistently without increase in pain and with improving function to demonstrate good toleration and effectiveness of intervention.  Baseline: Goal status: Met 12/29/23  2.  Pt will tolerate stair climbing in and out of pool using approp  pattern ascending and descending 6 steps with use of handrail  Baseline:  Goal status: Met 12/29/23  3.  Pt will perform 10 STS from water  bench onto water  step unsupported without LOB Baseline:  Goal status:  Met 12/29/23  4.  Pt and cg will consider grab bars for commode  Baseline:  Goal status: deferred as pt reports she is rising off of a raised commode without difficulty 12/29/23  5.  Pt will be indep and compliant with initial land based HEP for improving strength and safety at home Baseline:  Goal status: Met 12/29/23    LONG TERM GOALS: Target date: 03/19/23  Pt to improve on ODI by 13% to demonstrate statistically significant Improvement in function. (MCID 13-15%) Baseline: 23/50=46% Goal status: ONGOING 02/08/24   2.  Pt will improve strength in bil hip flex and right knee ext by 1 grade to demonstrate improved overall physical function Baseline: see chart Goal status:  MET 02/08/24  3.  Pt will improve on Berg balance test to >/= 35/56 to demonstrate a decrease in fall risk. (MCD 7) Baseline:27/56 (medium fall risk) ; 34/56 Goal status: MET 01/25/24 see above   4.  Pt will improve on 30s STS test  from standard chair from 3 to 6 to demonstrate improving functional lower extremity strength, transitional movements, and balance. (MDC = 4.2sec)  Baseline: 3 Goal status: MET 02/08/24   5.  Pt will report decrease in pain by at least 50% for  improved toleration to activity/quality of life and to demonstrate improved management of pain. Baseline: see chart Goal status: ONGOING 02/08/24   6.  Pt will be indep with final HEP's (land and aquatic as appropriate) for continued management of condition Baseline: see chart Goal status: ONGOING 02/08/24   PLAN:  PT FREQUENCY: 2x/week  PT DURATION: 10 w(extended out due to holiday schedule) alternate land/aquatics  PLANNED INTERVENTIONS: [id number]- PT Re-evaluation, 97750- Physical Performance Testing, 97110-Therapeutic exercises, 97530- Therapeutic activity, 97112- Neuromuscular re-education, 97535- Self Care, [id number]- Manual therapy, [id number]- Gait training, [phone]- Aquatic Therapy, [phone]- Ionotophoresis 4mg /ml Dexamethasone , 79439 (1-2 muscles), [id number] (3+ muscles)- Dry Needling, Patient/Family education, Balance training, Stair training, Taping, Joint mobilization, DME instructions, Cryotherapy, and Moist heat.  PLAN FOR NEXT SESSION: aquatic: instruct on HEP and issue laminated copy  Land:  continue to progress balance and glute/quad strength, continue with more dynamic surfaces/activities, continue to work on back spasm/pain if its still present   Rojean Batten PT, DPT 02/27/2024  11:43 AM    "

## 2024-02-29 ENCOUNTER — Ambulatory Visit (HOSPITAL_BASED_OUTPATIENT_CLINIC_OR_DEPARTMENT_OTHER): Payer: Self-pay | Admitting: Physical Therapy

## 2024-02-29 ENCOUNTER — Encounter (HOSPITAL_BASED_OUTPATIENT_CLINIC_OR_DEPARTMENT_OTHER): Payer: Self-pay | Admitting: Physical Therapy

## 2024-02-29 DIAGNOSIS — M6281 Muscle weakness (generalized): Secondary | ICD-10-CM

## 2024-02-29 DIAGNOSIS — R2681 Unsteadiness on feet: Secondary | ICD-10-CM

## 2024-02-29 DIAGNOSIS — M5459 Other low back pain: Secondary | ICD-10-CM | POA: Diagnosis not present

## 2024-02-29 DIAGNOSIS — G8929 Other chronic pain: Secondary | ICD-10-CM

## 2024-02-29 NOTE — Telephone Encounter (Signed)
 Please make an appointment for this patient  in the next 4-8 weeks, CD

## 2024-03-01 NOTE — Telephone Encounter (Signed)
 thanks

## 2024-03-01 NOTE — Telephone Encounter (Signed)
 Silvano will you offer patient Tuesday 04/13/23 at 11:30 with Dr Chalice?

## 2024-03-03 ENCOUNTER — Encounter (HOSPITAL_BASED_OUTPATIENT_CLINIC_OR_DEPARTMENT_OTHER): Payer: Self-pay | Admitting: Physical Therapy

## 2024-03-03 ENCOUNTER — Ambulatory Visit (HOSPITAL_BASED_OUTPATIENT_CLINIC_OR_DEPARTMENT_OTHER): Payer: Self-pay | Admitting: Physical Therapy

## 2024-03-03 DIAGNOSIS — M5459 Other low back pain: Secondary | ICD-10-CM | POA: Diagnosis not present

## 2024-03-03 DIAGNOSIS — G8929 Other chronic pain: Secondary | ICD-10-CM

## 2024-03-03 DIAGNOSIS — M6281 Muscle weakness (generalized): Secondary | ICD-10-CM

## 2024-03-03 DIAGNOSIS — R2681 Unsteadiness on feet: Secondary | ICD-10-CM

## 2024-03-03 NOTE — Therapy (Signed)
 " OUTPATIENT PHYSICAL THERAPY THORACOLUMBAR TREATMENT   Patient Name: [name] MRN: [id number] DOB:1940-03-06, 84 y.o., female Today's Date: 03/03/2024  END OF SESSION:  PT End of Session - 03/03/24 1416     Visit Number 26    Date for Recertification  04/25/24    Authorization Type aetna Mcr    Progress Note Due on Visit 29    PT Start Time 1401    PT Stop Time 1445    PT Time Calculation (min) 44 min    Equipment Utilized During Treatment Gait belt    Activity Tolerance Patient tolerated treatment well    Behavior During Therapy WFL for tasks assessed/performed             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   Hyperlipidemia    Impaired hearing    left ear, wears hearing aids   Insomnia    circadian rhythm component   Insomnia    Migraine 11/1986   Spillman   Nuclear sclerosis    OSA on CPAP 03/2005   uses cpap setting of 10   Osteoarthritis 2007   Deveschwar   Pain last week   left under breast pain    Plantar fasciitis    Rheumatic fever    Scarlet fever as child   Sleep apnea    Swallowing difficulty    Thyroid  disorder    Vitamin D  deficiency    Vitreous degeneration of right eye    Past Surgical History:  Procedure Laterality Date   BREAST BIOPSY Left 6/00   [place]   BREAST EXCISIONAL BIOPSY Left 1998   DE QUERVAIN'S RELEASE Right 10/97   Sypher   left breast biopsy     ROOT CANAL  02/11/2012   TONSILLECTOMY  age 58   TONSILLECTOMY AND ADENOIDECTOMY  1952   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   TOTAL KNEE ARTHROPLASTY Right 11/08/2012   Procedure: RIGHT TOTAL KNEE ARTHROPLASTY;  Surgeon: Dempsey LULLA Moan, MD;  Location: [facility];  Service: Orthopedics;  Laterality: Right;   TUBAL LIGATION      Patient Active Problem List   Diagnosis Date Noted   Venous stasis of lower extremity 07/28/2023   Great toe pain, right 06/16/2023   Acute pain of right knee 05/28/2023   Closed compression fracture of body of first lumbar vertebra (HCC) 05/28/2023   Bilateral lower extremity edema 02/03/2022   Fluid retention 12/17/2021   Low back pain 10/03/2021   Atherosclerotic heart disease of native coronary artery without angina pectoris 05/27/2021   Diastolic dysfunction 05/27/2021   Neck pain 05/27/2021   Migraine 04/23/2021   Generalized obesity 04/23/2021   Depression 03/18/2021   Lumbar spine pain 08/30/2020   Flatulence, eructation and gas pain 05/07/2020   Change in bowel habit 05/07/2020   Gout 06/22/2019   Post viral syndrome 06/15/2019   Cervicalgia 06/15/2019   Chronic tension-type headache, intractable 06/15/2019   Fall 04/21/2018   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   Diverticulosis 12/23/2017   History of adenomatous polyp of colon 12/23/2017   Recurrent falls 12/14/2017   Degenerative disc disease, cervical 12/14/2017   Chronic constipation 12/14/2017   Floaters in visual  field, bilateral 08/10/2017   Head injury consultation 08/10/2017   Arthritis of carpometacarpal [facility]) joint of right thumb 05/14/2017   Therapeutic opioid-induced constipation (OIC) 02/05/2017   BMI 29.0-29.9,adult 02/05/2017   Dry mouth 02/05/2017   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/08/2017   Intolerance of continuous positive airway pressure (CPAP) ventilation 01/08/2017   Benign neoplasm of colon 11/11/2016   Other long term (current) drug therapy 11/11/2016   History of total knee arthroplasty, 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   Cough 03/06/2016   At risk for injury related to fall 02/14/2016   Primary osteoarthritis of both hands 02/04/2016   Osteopenia of multiple sites 02/04/2016   Metatarsalgia of both feet 02/04/2016   Migraines 02/04/2016   Other fatigue 02/01/2016   Melena  08/20/2015   Acute recurrent maxillary sinusitis 08/20/2015   Slow transit constipation 08/20/2015   Vitamin D  deficiency 08/20/2015   Thyroid  activity decreased 08/20/2015   Cephalalgia 08/20/2015   Preop cardiovascular exam 07/20/2015   Subacute ethmoidal sinusitis 12/11/2014   Chronic infection of sinus 07/12/2014   Nasal septal ulcer 05/25/2014   DJD (degenerative joint disease), cervical 03/22/2014   Right hand pain 03/22/2014   Deflected nasal septum 01/30/2014   Hypertrophy of nasal turbinates 01/30/2014   UARS (upper airway resistance syndrome) 10/04/2013   Insomnia secondary to depression with anxiety 10/04/2013   Exertional dyspnea 08/20/2013   History of rheumatic fever 08/20/2013   Hypomania (mild) single episode or unspecified 08/09/2013   OSA on CPAP 04/06/2013   Postoperative anemia due to acute blood loss 11/09/2012   OA (osteoarthritis) of knee 11/08/2012   History of giardia infection 07/15/2012   Atrophic vaginitis 07/15/2012   Vitreous degeneration of right eye    Nuclear sclerosis    Right shoulder pain 06/18/2011   Foot pain, left 06/18/2011   Hyperlipidemia 03/24/2011   Disorder of bone 02/12/2009   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  8/10       Eval:  Fell in June. Have had  2 compression fx.    2 falls in one day early march 1st compression fx then again in June sustaining a second.  Has a membership here at [facility]. I have been Walking in pool 4 x week for 45 mins to 1 hr. But I still hurt. I want to work on my balance and stand up from a chair without using my arms.  Standing up from a chair my right knee and back hurts. Using rollator when walking back to pool but not any other time  PERTINENT HISTORY:  Evaluate and treat for L1 compression fx and right knee pain. Include aquatic therapy in treatment plan.  Chronic fatigue; fibromyalgia  PAIN:  Are you having pain? yes: NPRS scale:7/10 Pain location: R side low back Pain description: very tight, sore to touch   Aggravating factors: not sure Relieving factors: rest. Voltaren  and heat   PRECAUTIONS: Fall  RED FLAGS: None   WEIGHT BEARING RESTRICTIONS: No  FALLS:  Has patient fallen in last 6 months? Yes. Number of falls 1 fall backwards after putting my foot on a chair to toe my shoe.  LIVING ENVIRONMENT: Lives with: lives with their spouse Lives in: House/apartment Has following equipment at home: Single point cane, Environmental Consultant - 2 wheeled, Environmental Consultant - 4 wheeled, and Grab bars  OCCUPATION: retired financial controller  PLOF: Independent  PATIENT GOALS: Improve balance, get in and out of chairs, car and bed better  NEXT MD VISIT: as needed  OBJECTIVE:  Note: Objective measures were completed at Evaluation unless otherwise noted.  DIAGNOSTIC FINDINGS:  7/25 CT Lumbar IMPRESSION: 1. Subacute compression deformities at T12 and L1 with loss of height of 10% at each level. Sclerotic change consistent with ongoing healing process. I cannot state by CT if there is complete healing. No retropulsed bone. 2. L3-4: Mild disc bulge and facet osteoarthritis. No compressive stenosis. 3. L4-5: Mild disc bulge.  Bilateral facet degeneration and hypertrophy. 1-2 mm of degenerative anterolisthesis. Mild narrowing of both lateral recesses, not grossly compressive. 4. L5-S1: Endplate osteophytes and bulging of the disc. Bilateral facet osteoarthritis worse on the right. No compressive stenosis.  05/29/23 Xray R knee IMPRESSION: 1. 9.3 x 3.9 x 3.7 cm heterogenous layering hypodense fluid collection most compatible with a hematoma extending along the superficial fascia of the right posterolateral thigh, overlying and compressing the biceps femoris musculature, which could represent a soft tissue degloving injury, such as a Morel-Lavallee lesion. This collection appears to continue below the level of the knee into the lateral gastrocnemius muscle, concerning for intramuscular hematoma. 2. Intact right total knee arthroplasty. No acute osseous abnormality. 3. Small knee joint effusion.  PATIENT SURVEYS:  ODI: 23/50=46%                       ODI 20/50 = 40% COGNITION: Overall cognitive status: Within functional limits for tasks assessed     SENSATION: WFL  MUSCLE LENGTH: Hamstrings: wfl   POSTURE: rounded shoulders, forward head, decreased thoracic kyphosis, and left pelvic obliquity  PALPATION: Right patella crepitus  LUMBAR ROM:   AROM eval 12/29/23 02/08/24  Flexion full    Extension     Right lateral flexion 90% limited P! 50% limited P! 40% limited P!   Left lateral flexion 90% limited P! 50% limited P! 75% limited  P!  Right rotation     Left rotation      (Blank rows = not tested)  LOWER EXTREMITY ROM:     Active  Right eval Left eval  Hip flexion    Hip extension    Hip abduction    Hip adduction    Hip internal rotation    Hip external rotation    Knee flexion 110 128  Knee extension 0 0  Ankle dorsiflexion    Ankle plantarflexion    Ankle inversion    Ankle eversion     (Blank rows = not tested)  LOWER EXTREMITY MMT:    MMT Right eval Left eval R /  L 12/29/23 R 01/25/24 L 01/25/24 R 02/08/24 L 02/08/24  Hip flexion 3 3 3+ / 3+ 3 3+ 4 4  Hip extension         Hip abduction 4+ 4+ 5- / 5- 4- sidelying  4+ sidelying  4 4  Hip abd 4- 4- 5 /5      Hip internal rotation         Hip external rotation         Knee flexion 4 5 5 5 5  4+ 4+  Knee extension 3+ 4 4 4+ 4+ 4+ 4+  Ankle dorsiflexion         Ankle plantarflexion         Ankle inversion         Ankle eversion          (Blank rows = not tested)  LUMBAR SPECIAL TESTS:  Slump test: Negative  FUNCTIONAL TESTS:  30 seconds chair stand test Timed up and go (TUG): 21.24   02/08/24 TUG 13.5 seconds no device  30 second chair stand test x9 reps using UEs on chair   03/01/24: 4 laps around clinic 6.54 minutes with [facility]    Item Test date: 11/05/23 Date: 12/29/23 Date: 01/25/24  Sitting to standing 2. able to stand using hands after several tries 3. able to stand independently using hands  4. able to stand safely for 2 minutes  4. able to sit safely and securely for 2 minutes   3. controls descent by using hands  3. able to transfer safely with definite need of hands  4. able to stand 10 seconds safely  3. able to place feet together independently and stand 1 minute with supervision  4. can reach forward confidently 25 cm (10 inches)    2. unable to pick up but reaches 2-5 cm (1-2 inches) from slipper and keeps balance independently  3. looks behind one side only, other side shows less weight shift    1. needs close supervision or verbal cuing  0. needs assistance to keep from falling/unable to try    0. loses balance while stepping or standing   0. unable to try of needs assist to prevent fall     Insert SmartPhrase OPRCBERGREEVAL  2. Standing unsupported 4. able to stand safely for 2 minutes    3. Sitting with back /unsupported, feet supported 4. able to sit safely and securely for 2 minutes    4. Standing to sitting 2. uses back of legs against chair to control  descent    5. Pivot transfer  3. able to transfer safely with definite need of hands    6. Standing unsupported with eyes closed 2. able to stand 3 seconds    7. Standing unsupported with feet together 1. needs help to attain  position but able to stand 15 seconds feet together    8. Reaching forward with outstretched arms while standing 2. can reach forward 5 cm (2 inches)    9. Pick up object from the floor from standing 3. able to pick up slipper but needs supervision    10. Turning to look behind over left and right shoulders while standing 3. looks behind one side only, other side shows less weight shift    11. Turn 360 degrees 1. needs close supervision or verbal cuing    12. Place alternate foot on step or stool while standing unsupported 0. needs assistance to keep from falling/unable to try    13. Standing unsupported one foot in front 0. loses balance while stepping or standing    14. Standing on one leg 0. unable to try of needs assist to prevent fall      Total Score 27/56 Total Score:   34/56 Total Score: 43/56       01/25/24   Standardized Balance Assessment  Standardized Balance Assessment Berg Balance Test  Berg Balance Test  Sit to Stand 4  Standing Unsupported 4  Sitting with Back Unsupported but Feet Supported on Floor or Stool 4  Stand to Sit 4  Transfers 4  Standing Unsupported with Eyes Closed 3  Standing Unsupported with Feet Together 3  From Standing, Reach Forward with Outstretched Arm 3  From Standing Position, Pick up Object from Floor 3  From Standing Position, Turn to Look Behind Over each Shoulder 3  Turn 360 Degrees 2  Standing Unsupported, Alternately Place Feet on Step/Stool 3  Standing Unsupported, One Foot in Front 0  Standing on One Leg 3  Total Score 43        GAIT: Distance walked: 400 ft Assistive device utilized: rollator Level of assistance: Complete Independence Comments: quickened short step length, reduced hip/knee flex, little  arm swing  TREATMENT  [facility] Adult PT Treatment:     03/03/24 Pt seen for aquatic therapy today.  Treatment took place in water  3.5-4.75 ft in depth at the [facility] pool. Temp of water  was 91.  Pt entered/exited the pool via stairs with bil rail.   Exercises - suitcase carry with single hand float at side (or on both sides), walking forward/backward   - Side Stepping with or without Hand floats  - Open Book. Stand  near wall.   Head follows hand for spinal twist.  - Tight rope toe to heel walk - forward/backward  - L stretch at pool wall  - Leg kick to side- hold pool wall  (lead with heel, not too high)  - Sit to Stand with forward arm reach (stick your bottom out and bend your knees) - slow and controlled motion  - Standing toe taps to first step, alternating legs  - Standing on one leg and moving noodle under water  in opposite hand  - Wall Push Up and Off    03/01/24 Nustep L3x10 minutes seat 9 all LE extremities  Seated marching Mimicked putting on pants with RTB, taking on/ off feet and pulling up around thighs.  Discussed increasing POC at next progress note, updated goals today with increased focus on patients current complaints.  Walking with SPC and gait belt to assess gait and distance. Pt made it 4 laps in 6.5 minutes.   02/26/24 Pt seen for aquatic therapy today.  Treatment took place in water  3.5-4.75 ft in depth at the [facility] pool. Temp of water   was 91.  Pt entered/exited the pool via stairs with bil rail.  -warm up of walking forward/ backward, unsupported, multiple laps - suitcase carry with single yellow hand float under water  at side, walking backward / forward - L stretch at wall - wall push up and off, x 12 - minor cues for form - SLS with opposite hand holding hollow noodle under water  at side - L stretch at wall - tandem gait without support (difficult)/ with yellow hand floats (improved) -side stepping into wide squat with UE  add/abd with rainbow hand floats -UE on wall: hip abdct/ add x 10; single leg clams x 10 each - STS on 3rd step x 8 reps with SBA (improved form with repetition and VC)  02/22/24 Nustep L4x8 minutes seat 9 all LE extremities  LAQ 2x10  Seated marching 2x10  Supine LTR x20  Supine SLR x10  Supine marching  Sit to stand with momentum strategy   Discussed coming in on weekends to walk in the water  Discussion about importance of    02/19/24 Pt seen for aquatic therapy today.  Treatment took place in water  3.5-4.75 ft in depth at the [facility] pool. Temp of water  was 91.  Pt entered/exited the pool via stairs with bil rail.  -walking forward/ backward, unsupported, multiple laps, cues for even step height, neutral Rt foot -side stepping with UE add/abd with rainbow hand floats -> into wide squat with arm add/abdct -in 20ft 6 tandem gait forward/ backward, UE on rainbow hand floats (big balance challenge) - L stretch at wall - return to walking forward/ backward with reciprocal arm swing - forward step with single row with  medium resistance bell, and return to neutral x 10 each side (balance challenge)  -UE on wall: hip abdct/ add x 10; single leg clams x 10 each - wall push up/off x10 - STS on 3rd step x 3 reps with SBA (improved form with repetition)  02/15/24:                                           Nustep L6x8 minutes seat 10 all four extremities  Seated LAQ Seated marching  1 lap around clinic with focus on big steps Sit to stands from hi -low table  Toe tap to cone in center to work on hip flexion and ER for putting on pants Stepping over tape on floor in square to help with big steps in lateral/ fwd/ bkwd directions Assessment of coordination on LE Discussion about parkinson type symptoms.    02/12/24 Pt seen for aquatic therapy today.  Treatment took place in water  3.5-4.75 ft in depth at the [facility] pool. Temp of water  was 91.  Pt  entered/exited the pool via stairs with bil rail.  -walking forward and backward, unsupported, multiple laps, cues for even step length and vertical trunk -side stepping with ue horizontal add/abd with rainbow hand floats -> abdct/add with rainbow hand floats - suitcase carry with single rainbow hand float, walking forward/backward  -Bow and Arrow (difficulty coordinating). Focused on weight shift back/forth. - forward step with single row with light resistance bell, and return to neutral x 8 each side - L stretch with UE on wall x 3 reps of 10-15 sec - return to walking forward/ backward  -SLS in 4+ ft with shoulder add/abdct with short hollow noodles x 5 eac h(increased  back pain) - repeated L stretch at wall - tandem gait forward/ backward, hands on top of water  (challenge) - STS on 3rd step x 3 reps with SBA (improved form with repetition)  02/08/24  Nustep L6x8 minutes seat 10 all four extremities   ODI, MMT, TUG, 30 second chair stand test, lumbar ROM, goals  Tandem stance blue foam 6x30 seconds alternating SLS x15 seconds B alternating   02/05/24 Pt seen for aquatic therapy today.  Treatment took place in water  3.5-4.75 ft in depth at the [facility] pool. Temp of water  was 91.  Pt entered/exited the pool via stairs with bil rail.  -walking forward and back -L stretch->hip hiking -side stepping with ue horizontal add/abd -wide stance ue horizontal add/abd -Bow and Arrow (difficulty coordinating). Focused on weight shift back/forth. -STS from water  bench onto step unsupported 2x 10.  VC and demonstration for execution. Slowed descent.  Good execution no LOB -figure 4 stretch at steps (Not tolerated well) -open book R/L x 5  02/01/24  Nustep L6x8 minutes seat 10 all four extremities   Lumbar rotation stretch 5x5 seconds B  SKTC 5x5 seconds B Figure 4 stretch B 2x30 seconds Thoracic flexion/extension AROM x10   Tandem stance blue foam 6x30 seconds alternating   Sidesteps on blue foam pad x3 laps   01/25/24  Nustep L6x8 minutes seat 10 all four extremities  MMT, Berg, goals, education on progress with PT  Tandem stance blue foam pad 6x30 seconds alternating Standing marches/tapping top of 6 inch box blue foam pad x20  Forward and lateral steps over hurdles at bar x2 laps Lateral steps over hurdles x1 lap at bar, got light headed- BP 133/72 HR 76, dizziness resolved before we checked SpO2    PATIENT EDUCATION:  Education details: intro to aquatic therapy   Person educated: Patient Education method: Explanation, demonstration Education comprehension: verbalized understanding  HOME EXERCISE PROGRAM:  Access Code: [email or web address] URL: [email or web address] Date: 02/01/2024 Prepared by: Josette Rough  Exercises - Theracane Over Shoulder  - 1 x daily - 7 x weekly - 3 sets - 10 reps - Seated Bilateral Shoulder Flexion Towel Slide at Table Top  - 1 x daily - 7 x weekly - 3 sets - 10 reps - Sit to Stand with Arms Crossed  - 2 x daily - 7 x weekly - 2 sets - 10 reps - Seated Hip Abduction with Resistance  - 1 x daily - 7 x weekly - 3 sets - 10 reps - Seated Long Arc Quad  - 1 x daily - 7 x weekly - 3 sets - 10 reps - Standing March with Counter Support  - 1 x daily - 7 x weekly - 3 sets - 10 reps - Standing Hip Abduction with Counter Support  - 1 x daily - 7 x weekly - 3 sets - 10 reps - Standing Hip Extension with Counter Support  - 1 x daily - 7 x weekly - 3 sets - 10 reps - Narrow Stance with Counter Support  - 1 x daily - 7 x weekly - 3 sets - 10 reps - Standing Tandem Balance with Counter Support  - 1 x daily - 7 x weekly - 3 sets - 10 reps - Supine Lower Trunk Rotation  - 1 x daily - 7 x weekly - 1-2 sets - 10 reps - 5 seconds  hold - Supine Single Knee to Chest Stretch  - 1 x daily - 7 x weekly - 1-2 sets -  10 reps - 5 seconds  hold - Supine Figure 4 Piriformis Stretch  - 1 x daily - 7 x weekly - 1-2 sets - 6 reps - 30 seconds   hold - Seated Thoracic Flexion and Extension  - 1 x daily - 7 x weekly - 1-2 sets - 10 reps  Access Code: [email or web address] URL: [email or web address] Date: 03/03/2024 Prepared by: Matilda Kohut  Exercises - suitcase carry with single hand float at side (or on both sides), walking forward/ backward  - 1-3 x weekly - Side Stepping with or without Hand floats   - 1-3 x weekly - Open Book. Stand  near wall.   Head follows hand for spinal twist.  - 1-3 x weekly - 5-10 reps - Tight rope toe to heel walk - forward/backward  - 1-2 x weekly - 3 reps - L stretch at pool wall   - 2 x daily - 1-3 x weekly - 2 reps - 10 seconds hold - Leg kick to side- hold pool wall  (lead with heel, not too high)  - 1-3 x weekly - 1-2 sets - 10 reps - Sit to Stand with forward arm reach (stick your bottom out and bend your knees) - slow and controlled motion  - 1-3 x weekly - 1 sets - 5-10 reps - Standing toe taps to first step, alternating legs  - 1-3 x weekly - 1-2 sets - 10 reps - Standing on one leg and moving noodle under water  in opposite hand  - 1-3 x weekly - 2-3 reps - 15-30 seconds  hold - Wall Push Up and Off   - 1-3 x weekly - 1-2 sets - 10 reps ASSESSMENT:  CLINICAL IMPRESSION: Pt arrives for final aquatic session.  She is directed through HEP and issued laminated copy.  She is provided vc, demonstration and written carnifications.  Instructions on adding and reducing challenge as approp according to how she is feeling at the time.  She demonstrates and VU.  Pt comes to pool 3-5 x week indep as she is a member of [facility].  She has reached her max potential in setting and is ready for complete transition onto land based setting for added loading and balance challenge.  PN#3 - Pt has made great progress with PT thus far. She has now able to walk with a SPC instead of a walker. She would like to ultimately like to walk without an AD. Pt continues to struggle with narrow base of support exercises.  She also ambulates with a shuffled gait, but does better with cues. She reports difficulty with ADL's such as negotiating stairs, and getting dressed. Pt also complains of difficulty walking outside due to fear of falling and negotiating curbs. We have discussed possibility of having undiagnosed early onset of parkinson's. Neuro MD is aware and will assess and schedule MRI at next meeting. Pain in back has improved significantly. She states that it eases up with movement and notes more discomfort with increased standing. Pt would continue to benefit from skilled PT to continue working on residual deficits.     PN #2- patient continues to have back pain but she has made great progress in terms of functional strength, balance, and overall mobility. She does however remain a bit of a high fall risk and also demonstrates impaired lumbar ROM as well as impaired functional activity tolerance. Feel that she would benefit from skilled PT services in order to continue fine tuning all remaining impairments/working toward goals yet unmet,  also to establish appropriate long term independent exercise programming prior to possible DC at end of current cert/scheduled visits.      OBJECTIVE IMPAIRMENTS: Abnormal gait, decreased activity tolerance, decreased balance, decreased knowledge of use of DME, decreased mobility, difficulty walking, decreased ROM, decreased strength, postural dysfunction, obesity, and pain.   ACTIVITY LIMITATIONS: carrying, lifting, bending, sitting, standing, squatting, stairs, and transfers  PARTICIPATION LIMITATIONS: meal prep, cleaning, laundry, driving, shopping, and community activity  PERSONAL FACTORS: Age and 1-2 comorbidities: see PmHx are also affecting patient's functional outcome.   REHAB POTENTIAL: Good  CLINICAL DECISION MAKING: Evolving/moderate complexity  EVALUATION COMPLEXITY: Moderate   GOALS: Goals reviewed with patient? Yes  SHORT TERM GOALS: Target date:  12/14/23 (delay in beginning therapy)  Pt will tolerate full aquatic sessions consistently without increase in pain and with improving function to demonstrate good toleration and effectiveness of intervention.  Baseline: Goal status: Met 12/29/23  2.  Pt will tolerate stair climbing in and out of pool using approp pattern ascending and descending 6 steps with use of handrail  Baseline:  Goal status: Met 12/29/23  3.  Pt will perform 10 STS from water  bench onto water  step unsupported without LOB Baseline:  Goal status:  Met 12/29/23  4.  Pt and cg will consider grab bars for commode  Baseline:  Goal status: deferred as pt reports she is rising off of a raised commode without difficulty 12/29/23  5.  Pt will be indep and compliant with initial land based HEP for improving strength and safety at home Baseline:  Goal status: Met 12/29/23    LONG TERM GOALS: Target date: POC date   Pt to improve on ODI by 13% to demonstrate statistically significant Improvement in function. (MCID 13-15%) Baseline: 23/50=46% Goal status: ONGOING 02/08/24   2.  Pt will improve strength in bil hip flex and right knee ext by 1 grade to demonstrate improved overall physical function Baseline: see chart Goal status:  MET 02/08/24  3.  Pt will improve on Berg balance test to >/= 35/56 to demonstrate a decrease in fall risk. (MCD 7) Baseline:27/56 (medium fall risk) ; 34/56 Goal status: MET 01/25/24 see above   4.  Pt will improve on 30s STS test  from standard chair from 3 to 6 to demonstrate improving functional lower extremity strength, transitional movements, and balance. (MDC = 4.2sec)  Baseline: 3 Goal status: MET 02/08/24   5.  Pt will report decrease in pain by at least 50% for improved toleration to activity/quality of life and to demonstrate improved management of pain. Baseline: see chart Goal status: MET 03/01/24  6. Pt will be indep with final HEP's (land and aquatic as appropriate) for  continued management of condition Baseline: see chart Goal status: ONGOING 02/08/24 ; Partially Met 03/03/24 (aquatics) 7. I would like to be able to put on my clothing (pants) without feeling unstable.   Baseline: Pt is unable to put on her own underwear and pants without increased time or without sitting on a commode.   Goal status: NEW 8. Pt would like to negotiate stairs with reciprocal gait pattern without fear of falling.   Baseline: Increased time and step to gait pattern  Goal status: NEW 9. Pt would like to be able to walk community distances with [facility] or without AD without shuffling.   Baseline: Pt ambulates intermittently with SPC and shuffled gait.   Goal status: NEW   PLAN:  PT FREQUENCY: 2x/week  PT DURATION: 10 w alternate  land/aquatics  PLANNED INTERVENTIONS: 97164- PT Re-evaluation, 97750- Physical Performance Testing, 97110-Therapeutic exercises, 97530- Therapeutic activity, [id number]- Neuromuscular re-education, [phone]- Self Care, 02859- Manual therapy, [phone]- Gait training, [phone]- Aquatic Therapy, [phone]- Ionotophoresis 4mg /ml Dexamethasone , 79439 (1-2 muscles), 20561 (3+ muscles)- Dry Needling, Patient/Family education, Balance training, Stair training, Taping, Joint mobilization, DME instructions, Cryotherapy, and Moist heat.  PLAN FOR NEXT SESSION: aquatic:complete  Land:  continue to progress balance and glute/quad strength, continue with more dynamic surfaces/activities, continue to work on back spasm/pain if its still present   Ronal Kem) Shanell Aden MPT 03/03/2024 2:49 PM [facility] [address] [place], [place], [address] Phone: [phone]   Fax:  [phone]     "

## 2024-03-04 ENCOUNTER — Other Ambulatory Visit: Payer: Self-pay | Admitting: Sports Medicine

## 2024-03-07 ENCOUNTER — Encounter (HOSPITAL_BASED_OUTPATIENT_CLINIC_OR_DEPARTMENT_OTHER): Payer: Self-pay | Admitting: Physical Therapy

## 2024-03-07 ENCOUNTER — Ambulatory Visit (HOSPITAL_BASED_OUTPATIENT_CLINIC_OR_DEPARTMENT_OTHER): Admitting: Physical Therapy

## 2024-03-07 DIAGNOSIS — M5459 Other low back pain: Secondary | ICD-10-CM | POA: Diagnosis not present

## 2024-03-07 DIAGNOSIS — M6281 Muscle weakness (generalized): Secondary | ICD-10-CM

## 2024-03-07 DIAGNOSIS — R2681 Unsteadiness on feet: Secondary | ICD-10-CM

## 2024-03-07 NOTE — Therapy (Signed)
 " OUTPATIENT PHYSICAL THERAPY THORACOLUMBAR TREATMENT   Patient Name: [name] MRN: [id number] DOB:01-Apr-1940, 84 y.o., female Today's Date: 03/08/2024  END OF SESSION:  PT End of Session - 03/07/24 1357     Visit Number 27    Number of Visits 16    Date for Recertification  04/25/24    Authorization Type hulan Mcr    PT Start Time 1232    PT Stop Time 1315    PT Time Calculation (min) 43 min    Activity Tolerance Patient tolerated treatment well    Behavior During Therapy [facility] for tasks assessed/performed              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   Hyperlipidemia    Impaired hearing    left ear, wears hearing aids   Insomnia    circadian rhythm component   Insomnia    Migraine 11/1986   Spillman   Nuclear sclerosis    OSA on CPAP 03/2005   uses cpap setting of 10   Osteoarthritis 2007   Deveschwar   Pain last week   left under breast pain    Plantar fasciitis    Rheumatic fever    Scarlet fever as child   Sleep apnea    Swallowing difficulty    Thyroid  disorder    Vitamin D  deficiency    Vitreous degeneration of right eye    Past Surgical History:  Procedure Laterality Date   BREAST BIOPSY Left 6/00   Hardcastle   BREAST EXCISIONAL BIOPSY Left 1998   DE QUERVAIN'S RELEASE Right 10/97   Sypher   left breast biopsy     ROOT CANAL  02/11/2012   TONSILLECTOMY  age 68   TONSILLECTOMY AND ADENOIDECTOMY  1952   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   TOTAL KNEE ARTHROPLASTY Right 11/08/2012   Procedure: RIGHT TOTAL KNEE ARTHROPLASTY;  Surgeon: Brenda LULLA Moan, MD;  Location: [facility];  Service: Orthopedics;  Laterality: Right;   TUBAL LIGATION     Patient Active Problem List   Diagnosis Date Noted    Venous stasis of lower extremity 07/28/2023   Great toe pain, right 06/16/2023   Acute pain of right knee 05/28/2023   Closed compression fracture of body of first lumbar vertebra (HCC) 05/28/2023   Bilateral lower extremity edema 02/03/2022   Fluid retention 12/17/2021   Low back pain 10/03/2021   Atherosclerotic heart disease of native coronary artery without angina pectoris 05/27/2021   Diastolic dysfunction 05/27/2021   Neck pain 05/27/2021   Migraine 04/23/2021   Generalized obesity 04/23/2021   Depression 03/18/2021   Lumbar spine pain 08/30/2020   Flatulence, eructation and gas pain 05/07/2020   Change in bowel habit 05/07/2020   Gout 06/22/2019   Post viral syndrome 06/15/2019   Cervicalgia 06/15/2019   Chronic tension-type headache, intractable 06/15/2019   Fall 04/21/2018   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   Diverticulosis 12/23/2017   History of adenomatous polyp of colon 12/23/2017   Recurrent falls 12/14/2017   Degenerative disc disease, cervical 12/14/2017   Chronic constipation 12/14/2017   Floaters in visual field, bilateral 08/10/2017   Head injury consultation 08/10/2017  Arthritis of carpometacarpal (CMC) joint of right thumb 05/14/2017   Therapeutic opioid-induced constipation (OIC) 02/05/2017   BMI 29.0-29.9,adult 02/05/2017   Dry mouth 02/05/2017   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/08/2017   Intolerance of continuous positive airway pressure (CPAP) ventilation 01/08/2017   Benign neoplasm of colon 11/11/2016   Other long term (current) drug therapy 11/11/2016   History of total knee arthroplasty, 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   Cough 03/06/2016   At risk for injury related to fall 02/14/2016   Primary osteoarthritis of both hands 02/04/2016   Osteopenia of multiple sites 02/04/2016   Metatarsalgia of both feet 02/04/2016   Migraines 02/04/2016   Other fatigue 02/01/2016   Melena 08/20/2015   Acute recurrent maxillary sinusitis 08/20/2015    Slow transit constipation 08/20/2015   Vitamin D  deficiency 08/20/2015   Thyroid  activity decreased 08/20/2015   Cephalalgia 08/20/2015   Preop cardiovascular exam 07/20/2015   Subacute ethmoidal sinusitis 12/11/2014   Chronic infection of sinus 07/12/2014   Nasal septal ulcer 05/25/2014   DJD (degenerative joint disease), cervical 03/22/2014   Right hand pain 03/22/2014   Deflected nasal septum 01/30/2014   Hypertrophy of nasal turbinates 01/30/2014   UARS (upper airway resistance syndrome) 10/04/2013   Insomnia secondary to depression with anxiety 10/04/2013   Exertional dyspnea 08/20/2013   History of rheumatic fever 08/20/2013   Hypomania (mild) single episode or unspecified 08/09/2013   OSA on CPAP 04/06/2013   Postoperative anemia due to acute blood loss 11/09/2012   OA (osteoarthritis) of knee 11/08/2012   History of giardia infection 07/15/2012   Atrophic vaginitis 07/15/2012   Vitreous degeneration of right eye    Nuclear sclerosis    Right shoulder pain 06/18/2011   Foot pain, left 06/18/2011   Hyperlipidemia 03/24/2011   Disorder of bone 02/12/2009   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   Migraine, unspecified, not intractable, without status migrainosus 02/12/2009    PCP: Brenda Ore MD  REFERRING PROVIDER: Helene Haddock MD  REFERRING DIAG:  [phone] (ICD-10-CM) - Acute pain of right knee  S32.010A (ICD-10-CM) - Closed compression fracture of body of L1 vertebra (HCC)    Rationale for Evaluation and Treatment: Rehabilitation  THERAPY DIAG:  Muscle weakness (generalized)  Unsteadiness on feet  ONSET DATE: March 2025  SUBJECTIVE:                                                                                                                                                                                           SUBJECTIVE STATEMENT: Back is aggravated by standing. Would like to work on balance. She plans on scheduling more visits.  EvalBETHA Green  in June. Have had  2 compression fx.    2 falls in one day early march 1st compression fx then again in June sustaining a second.  Has a membership here at [facility]. I have been Walking in pool 4 x week for 45 mins to 1 hr. But I still hurt. I want to work on my balance and stand up from a chair without using my arms.  Standing up from a chair my right knee and back hurts. Using rollator when walking back to pool but not any other time  PERTINENT HISTORY:  Evaluate and treat for L1 compression fx and right knee pain. Include aquatic therapy in treatment plan.  Chronic fatigue; fibromyalgia  PAIN:  Are you having pain? yes: NPRS scale:7/10 Pain location: R side low back Pain description: very tight, sore to touch   Aggravating factors: not sure Relieving factors: rest. Voltaren  and heat   PRECAUTIONS: Fall  RED FLAGS: None   WEIGHT BEARING RESTRICTIONS: No  FALLS:  Has patient fallen in last 6 months? Yes. Number of falls 1 fall backwards after putting my foot on a chair to toe my shoe.  LIVING ENVIRONMENT: Lives with: lives with their spouse Lives in: House/apartment Has following equipment at home: Single point cane, Environmental Consultant - 2 wheeled, Environmental Consultant - 4 wheeled, and Grab bars  OCCUPATION: retired financial controller  PLOF: Independent  PATIENT GOALS: Improve balance, get in and out of chairs, car and bed better  NEXT MD VISIT: as needed  OBJECTIVE:  Note: Objective measures were completed at Evaluation unless otherwise noted.  DIAGNOSTIC FINDINGS:  7/25 CT Lumbar IMPRESSION: 1. Subacute compression deformities at T12 and L1 with loss of height of 10% at each level. Sclerotic change consistent with ongoing healing process. I cannot state by CT if there is complete healing. No retropulsed bone. 2. L3-4: Mild disc bulge and facet osteoarthritis. No compressive stenosis. 3. L4-5: Mild disc bulge. Bilateral facet degeneration and hypertrophy. 1-2 mm of degenerative  anterolisthesis. Mild narrowing of both lateral recesses, not grossly compressive. 4. L5-S1: Endplate osteophytes and bulging of the disc. Bilateral facet osteoarthritis worse on the right. No compressive stenosis.  05/29/23 Xray R knee IMPRESSION: 1. 9.3 x 3.9 x 3.7 cm heterogenous layering hypodense fluid collection most compatible with a hematoma extending along the superficial fascia of the right posterolateral thigh, overlying and compressing the biceps femoris musculature, which could represent a soft tissue degloving injury, such as a Morel-Lavallee lesion. This collection appears to continue below the level of the knee into the lateral gastrocnemius muscle, concerning for intramuscular hematoma. 2. Intact right total knee arthroplasty. No acute osseous abnormality. 3. Small knee joint effusion.  PATIENT SURVEYS:  ODI: 23/50=46%                       ODI 20/50 = 40% COGNITION: Overall cognitive status: Within functional limits for tasks assessed     SENSATION: WFL  MUSCLE LENGTH: Hamstrings: wfl   POSTURE: rounded shoulders, forward head, decreased thoracic kyphosis, and left pelvic obliquity  PALPATION: Right patella crepitus  LUMBAR ROM:   AROM eval 12/29/23 02/08/24  Flexion full    Extension     Right lateral flexion 90% limited P! 50% limited P! 40% limited P!   Left lateral flexion 90% limited P! 50% limited P! 75% limited P!  Right rotation     Left rotation      (Blank rows = not tested)  LOWER EXTREMITY ROM:     Active  Right eval Left eval  Hip flexion    Hip extension    Hip abduction    Hip adduction    Hip internal rotation    Hip external rotation    Knee flexion 110 128  Knee extension 0 0  Ankle dorsiflexion    Ankle plantarflexion    Ankle inversion    Ankle eversion     (Blank rows = not tested)  LOWER EXTREMITY MMT:    MMT Right eval Left eval R / L 12/29/23 R 01/25/24 L 01/25/24 R 02/08/24 L 02/08/24  Hip flexion 3 3 3+ /  3+ 3 3+ 4 4  Hip extension         Hip abduction 4+ 4+ 5- / 5- 4- sidelying  4+ sidelying  4 4  Hip abd 4- 4- 5 /5      Hip internal rotation         Hip external rotation         Knee flexion 4 5 5 5 5  4+ 4+  Knee extension 3+ 4 4 4+ 4+ 4+ 4+  Ankle dorsiflexion         Ankle plantarflexion         Ankle inversion         Ankle eversion          (Blank rows = not tested)  LUMBAR SPECIAL TESTS:  Slump test: Negative  FUNCTIONAL TESTS:  30 seconds chair stand test Timed up and go (TUG): 21.24   02/08/24 TUG 13.5 seconds no device  30 second chair stand test x9 reps using UEs on chair   03/01/24: 4 laps around clinic 6.54 minutes with [facility]    Item Test date: 11/05/23 Date: 12/29/23 Date: 01/25/24  Sitting to standing 2. able to stand using hands after several tries 3. able to stand independently using hands  4. able to stand safely for 2 minutes  4. able to sit safely and securely for 2 minutes   3. controls descent by using hands  3. able to transfer safely with definite need of hands  4. able to stand 10 seconds safely  3. able to place feet together independently and stand 1 minute with supervision  4. can reach forward confidently 25 cm (10 inches)    2. unable to pick up but reaches 2-5 cm (1-2 inches) from slipper and keeps balance independently  3. looks behind one side only, other side shows less weight shift    1. needs close supervision or verbal cuing  0. needs assistance to keep from falling/unable to try    0. loses balance while stepping or standing   0. unable to try of needs assist to prevent fall     Insert SmartPhrase OPRCBERGREEVAL  2. Standing unsupported 4. able to stand safely for 2 minutes    3. Sitting with back /unsupported, feet supported 4. able to sit safely and securely for 2 minutes    4. Standing to sitting 2. uses back of legs against chair to control descent    5. Pivot transfer  3. able to transfer safely with definite need of  hands    6. Standing unsupported with eyes closed 2. able to stand 3 seconds    7. Standing unsupported with feet together 1. needs help to attain position but able to stand 15 seconds feet together    8. Reaching forward with outstretched arms while standing 2.  can reach forward 5 cm (2 inches)    9. Pick up object from the floor from standing 3. able to pick up slipper but needs supervision    10. Turning to look behind over left and right shoulders while standing 3. looks behind one side only, other side shows less weight shift    11. Turn 360 degrees 1. needs close supervision or verbal cuing    12. Place alternate foot on step or stool while standing unsupported 0. needs assistance to keep from falling/unable to try    13. Standing unsupported one foot in front 0. loses balance while stepping or standing    14. Standing on one leg 0. unable to try of needs assist to prevent fall      Total Score 27/56 Total Score:   34/56 Total Score: 43/56       01/25/24   Standardized Balance Assessment  Standardized Balance Assessment Berg Balance Test  Berg Balance Test  Sit to Stand 4  Standing Unsupported 4  Sitting with Back Unsupported but Feet Supported on Floor or Stool 4  Stand to Sit 4  Transfers 4  Standing Unsupported with Eyes Closed 3  Standing Unsupported with Feet Together 3  From Standing, Reach Forward with Outstretched Arm 3  From Standing Position, Pick up Object from Floor 3  From Standing Position, Turn to Look Behind Over each Shoulder 3  Turn 360 Degrees 2  Standing Unsupported, Alternately Place Feet on Step/Stool 3  Standing Unsupported, One Foot in Front 0  Standing on One Leg 3  Total Score 43        GAIT: Distance walked: 400 ft Assistive device utilized: rollator Level of assistance: Complete Independence Comments: quickened short step length, reduced hip/knee flex, little arm swing  TREATMENT  [facility] Adult PT Treatment:    1/12  There-ex:  WU  nustep for 5 mins Seated abd 3x12 red Seated ext 3x12 red Seated march 3x12 red Updated and reviewed HEP   Neuro-re-ed Balance training - standing, tandem stance, eyes closed 2x30 sec hold On airex - tandem 2x30 sec hold  Foot rolls (lean forward and back) with CGA  03/03/24 Pt seen for aquatic therapy today.  Treatment took place in water  3.5-4.75 ft in depth at the [facility] pool. Temp of water  was 91.  Pt entered/exited the pool via stairs with bil rail.   Exercises - suitcase carry with single hand float at side (or on both sides), walking forward/backward   - Side Stepping with or without Hand floats  - Open Book. Stand  near wall.   Head follows hand for spinal twist.  - Tight rope toe to heel walk - forward/backward  - L stretch at pool wall  - Leg kick to side- hold pool wall  (lead with heel, not too high)  - Sit to Stand with forward arm reach (stick your bottom out and bend your knees) - slow and controlled motion  - Standing toe taps to first step, alternating legs  - Standing on one leg and moving noodle under water  in opposite hand  - Wall Push Up and Off    03/01/24 Nustep L3x10 minutes seat 9 all LE extremities  Seated marching Mimicked putting on pants with RTB, taking on/ off feet and pulling up around thighs.  Discussed increasing POC at next progress note, updated goals today with increased focus on patients current complaints.  Walking with SPC and gait belt to assess gait and distance. Pt  made it 4 laps in 6.5 minutes.   02/26/24 Pt seen for aquatic therapy today.  Treatment took place in water  3.5-4.75 ft in depth at the [facility] pool. Temp of water  was 91.  Pt entered/exited the pool via stairs with bil rail.  -warm up of walking forward/ backward, unsupported, multiple laps - suitcase carry with single yellow hand float under water  at side, walking backward / forward - L stretch at wall - wall push up and off, x 12 - minor  cues for form - SLS with opposite hand holding hollow noodle under water  at side - L stretch at wall - tandem gait without support (difficult)/ with yellow hand floats (improved) -side stepping into wide squat with UE add/abd with rainbow hand floats -UE on wall: hip abdct/ add x 10; single leg clams x 10 each - STS on 3rd step x 8 reps with SBA (improved form with repetition and VC)  02/22/24 Nustep L4x8 minutes seat 9 all LE extremities  LAQ 2x10  Seated marching 2x10  Supine LTR x20  Supine SLR x10  Supine marching  Sit to stand with momentum strategy   Discussed coming in on weekends to walk in the water  Discussion about importance of    02/19/24 Pt seen for aquatic therapy today.  Treatment took place in water  3.5-4.75 ft in depth at the [facility] pool. Temp of water  was 91.  Pt entered/exited the pool via stairs with bil rail.  -walking forward/ backward, unsupported, multiple laps, cues for even step height, neutral Rt foot -side stepping with UE add/abd with rainbow hand floats -> into wide squat with arm add/abdct -in 81ft 6 tandem gait forward/ backward, UE on rainbow hand floats (big balance challenge) - L stretch at wall - return to walking forward/ backward with reciprocal arm swing - forward step with single row with  medium resistance bell, and return to neutral x 10 each side (balance challenge)  -UE on wall: hip abdct/ add x 10; single leg clams x 10 each - wall push up/off x10 - STS on 3rd step x 3 reps with SBA (improved form with repetition)  02/15/24:                                           Nustep L6x8 minutes seat 10 all four extremities  Seated LAQ Seated marching  1 lap around clinic with focus on big steps Sit to stands from hi -low table  Toe tap to cone in center to work on hip flexion and ER for putting on pants Stepping over tape on floor in square to help with big steps in lateral/ fwd/ bkwd directions Assessment of  coordination on LE Discussion about parkinson type symptoms.    02/12/24 Pt seen for aquatic therapy today.  Treatment took place in water  3.5-4.75 ft in depth at the [facility] pool. Temp of water  was 91.  Pt entered/exited the pool via stairs with bil rail.  -walking forward and backward, unsupported, multiple laps, cues for even step length and vertical trunk -side stepping with ue horizontal add/abd with rainbow hand floats -> abdct/add with rainbow hand floats - suitcase carry with single rainbow hand float, walking forward/backward  -Bow and Arrow (difficulty coordinating). Focused on weight shift back/forth. - forward step with single row with light resistance bell, and return to neutral x 8 each side -  L stretch with UE on wall x 3 reps of 10-15 sec - return to walking forward/ backward  -SLS in 4+ ft with shoulder add/abdct with short hollow noodles x 5 eac h(increased back pain) - repeated L stretch at wall - tandem gait forward/ backward, hands on top of water  (challenge) - STS on 3rd step x 3 reps with SBA (improved form with repetition)  02/08/24  Nustep L6x8 minutes seat 10 all four extremities   ODI, MMT, TUG, 30 second chair stand test, lumbar ROM, goals  Tandem stance blue foam 6x30 seconds alternating SLS x15 seconds B alternating   02/05/24 Pt seen for aquatic therapy today.  Treatment took place in water  3.5-4.75 ft in depth at the [facility] pool. Temp of water  was 91.  Pt entered/exited the pool via stairs with bil rail.  -walking forward and back -L stretch->hip hiking -side stepping with ue horizontal add/abd -wide stance ue horizontal add/abd -Bow and Arrow (difficulty coordinating). Focused on weight shift back/forth. -STS from water  bench onto step unsupported 2x 10.  VC and demonstration for execution. Slowed descent.  Good execution no LOB -figure 4 stretch at steps (Not tolerated well) -open book R/L x 5  02/01/24  Nustep  L6x8 minutes seat 10 all four extremities   Lumbar rotation stretch 5x5 seconds B  SKTC 5x5 seconds B Figure 4 stretch B 2x30 seconds Thoracic flexion/extension AROM x10   Tandem stance blue foam 6x30 seconds alternating  Sidesteps on blue foam pad x3 laps   01/25/24  Nustep L6x8 minutes seat 10 all four extremities  MMT, Berg, goals, education on progress with PT  Tandem stance blue foam pad 6x30 seconds alternating Standing marches/tapping top of 6 inch box blue foam pad x20  Forward and lateral steps over hurdles at bar x2 laps Lateral steps over hurdles x1 lap at bar, got light headed- BP 133/72 HR 76, dizziness resolved before we checked SpO2    PATIENT EDUCATION:  Education details: intro to aquatic therapy   Person educated: Patient Education method: Explanation, demonstration Education comprehension: verbalized understanding  HOME EXERCISE PROGRAM:  Access Code: [email or web address] URL: [email or web address] Date: 02/01/2024 Prepared by: Josette Rough  Exercises - Theracane Over Shoulder  - 1 x daily - 7 x weekly - 3 sets - 10 reps - Seated Bilateral Shoulder Flexion Towel Slide at Table Top  - 1 x daily - 7 x weekly - 3 sets - 10 reps - Sit to Stand with Arms Crossed  - 2 x daily - 7 x weekly - 2 sets - 10 reps - Seated Hip Abduction with Resistance  - 1 x daily - 7 x weekly - 3 sets - 10 reps - Seated Long Arc Quad  - 1 x daily - 7 x weekly - 3 sets - 10 reps - Standing March with Counter Support  - 1 x daily - 7 x weekly - 3 sets - 10 reps - Standing Hip Abduction with Counter Support  - 1 x daily - 7 x weekly - 3 sets - 10 reps - Standing Hip Extension with Counter Support  - 1 x daily - 7 x weekly - 3 sets - 10 reps - Narrow Stance with Counter Support  - 1 x daily - 7 x weekly - 3 sets - 10 reps - Standing Tandem Balance with Counter Support  - 1 x daily - 7 x weekly - 3 sets - 10 reps - Supine Lower Trunk Rotation  - 1 x daily -  7 x weekly - 1-2 sets -  10 reps - 5 seconds  hold - Supine Single Knee to Chest Stretch  - 1 x daily - 7 x weekly - 1-2 sets - 10 reps - 5 seconds  hold - Supine Figure 4 Piriformis Stretch  - 1 x daily - 7 x weekly - 1-2 sets - 6 reps - 30 seconds  hold - Seated Thoracic Flexion and Extension  - 1 x daily - 7 x weekly - 1-2 sets - 10 reps  Access Code: [email or web address] URL: [email or web address] Date: 03/03/2024 Prepared by: Matilda Kohut  Exercises - suitcase carry with single hand float at side (or on both sides), walking forward/ backward  - 1-3 x weekly - Side Stepping with or without Hand floats   - 1-3 x weekly - Open Book. Stand  near wall.   Head follows hand for spinal twist.  - 1-3 x weekly - 5-10 reps - Tight rope toe to heel walk - forward/backward  - 1-2 x weekly - 3 reps - L stretch at pool wall   - 2 x daily - 1-3 x weekly - 2 reps - 10 seconds hold - Leg kick to side- hold pool wall  (lead with heel, not too high)  - 1-3 x weekly - 1-2 sets - 10 reps - Sit to Stand with forward arm reach (stick your bottom out and bend your knees) - slow and controlled motion  - 1-3 x weekly - 1 sets - 5-10 reps - Standing toe taps to first step, alternating legs  - 1-3 x weekly - 1-2 sets - 10 reps - Standing on one leg and moving noodle under water  in opposite hand  - 1-3 x weekly - 2-3 reps - 15-30 seconds  hold - Wall Push Up and Off   - 1-3 x weekly - 1-2 sets - 10 reps ASSESSMENT:  CLINICAL IMPRESSION: Pt was introduced to land-based balance exercises and tolerated them well. She was excited to work on balance and there are many areas to grow and elevate her plan of care. Next visit consider  work with dynamic balance, eyes closed, eye tracking. She required CGA for all balance activity. She was advised to use the sink for her balance. She was also advised to not perform if she feels like it is throwing her balance off too much. She was advised we can practice here until she Is more comfortable.    PN#3 - Pt has made great progress with PT thus far. She has now able to walk with a SPC instead of a walker. She would like to ultimately like to walk without an AD. Pt continues to struggle with narrow base of support exercises. She also ambulates with a shuffled gait, but does better with cues. She reports difficulty with ADL's such as negotiating stairs, and getting dressed. Pt also complains of difficulty walking outside due to fear of falling and negotiating curbs. We have discussed possibility of having undiagnosed early onset of parkinson's. Neuro MD is aware and will assess and schedule MRI at next meeting. Pain in back has improved significantly. She states that it eases up with movement and notes more discomfort with increased standing. Pt would continue to benefit from skilled PT to continue working on residual deficits.     PN #2- patient continues to have back pain but she has made great progress in terms of functional strength, balance, and overall mobility. She does however remain a bit of a high  fall risk and also demonstrates impaired lumbar ROM as well as impaired functional activity tolerance. Feel that she would benefit from skilled PT services in order to continue fine tuning all remaining impairments/working toward goals yet unmet, also to establish appropriate long term independent exercise programming prior to possible DC at end of current cert/scheduled visits.      OBJECTIVE IMPAIRMENTS: Abnormal gait, decreased activity tolerance, decreased balance, decreased knowledge of use of DME, decreased mobility, difficulty walking, decreased ROM, decreased strength, postural dysfunction, obesity, and pain.   ACTIVITY LIMITATIONS: carrying, lifting, bending, sitting, standing, squatting, stairs, and transfers  PARTICIPATION LIMITATIONS: meal prep, cleaning, laundry, driving, shopping, and community activity  PERSONAL FACTORS: Age and 1-2 comorbidities: see PmHx are also affecting  patient's functional outcome.   REHAB POTENTIAL: Good  CLINICAL DECISION MAKING: Evolving/moderate complexity  EVALUATION COMPLEXITY: Moderate   GOALS: Goals reviewed with patient? Yes  SHORT TERM GOALS: Target date: 12/14/23 (delay in beginning therapy)  Pt will tolerate full aquatic sessions consistently without increase in pain and with improving function to demonstrate good toleration and effectiveness of intervention.  Baseline: Goal status: Met 12/29/23  2.  Pt will tolerate stair climbing in and out of pool using approp pattern ascending and descending 6 steps with use of handrail  Baseline:  Goal status: Met 12/29/23  3.  Pt will perform 10 STS from water  bench onto water  step unsupported without LOB Baseline:  Goal status:  Met 12/29/23  4.  Pt and cg will consider grab bars for commode  Baseline:  Goal status: deferred as pt reports she is rising off of a raised commode without difficulty 12/29/23  5.  Pt will be indep and compliant with initial land based HEP for improving strength and safety at home Baseline:  Goal status: Met 12/29/23    LONG TERM GOALS: Target date: POC date   Pt to improve on ODI by 13% to demonstrate statistically significant Improvement in function. (MCID 13-15%) Baseline: 23/50=46% Goal status: ONGOING 02/08/24   2.  Pt will improve strength in bil hip flex and right knee ext by 1 grade to demonstrate improved overall physical function Baseline: see chart Goal status:  MET 02/08/24  3.  Pt will improve on Berg balance test to >/= 35/56 to demonstrate a decrease in fall risk. (MCD 7) Baseline:27/56 (medium fall risk) ; 34/56 Goal status: MET 01/25/24 see above   4.  Pt will improve on 30s STS test  from standard chair from 3 to 6 to demonstrate improving functional lower extremity strength, transitional movements, and balance. (MDC = 4.2sec)  Baseline: 3 Goal status: MET 02/08/24   5.  Pt will report decrease in pain by at least 50%  for improved toleration to activity/quality of life and to demonstrate improved management of pain. Baseline: see chart Goal status: MET 03/01/24  6. Pt will be indep with final HEP's (land and aquatic as appropriate) for continued management of condition Baseline: see chart Goal status: ONGOING 02/08/24 ; Partially Met 03/03/24 (aquatics) 7. I would like to be able to put on my clothing (pants) without feeling unstable.   Baseline: Pt is unable to put on her own underwear and pants without increased time or without sitting on a commode.   Goal status: NEW 8. Pt would like to negotiate stairs with reciprocal gait pattern without fear of falling.   Baseline: Increased time and step to gait pattern  Goal status: NEW 9. Pt would like to be able to walk community  distances with SPC or without AD without shuffling.   Baseline: Pt ambulates intermittently with SPC and shuffled gait.   Goal status: NEW   PLAN:  PT FREQUENCY: 2x/week  PT DURATION: 10 w alternate land/aquatics  PLANNED INTERVENTIONS: 02835- PT Re-evaluation, 97750- Physical Performance Testing, 97110-Therapeutic exercises, 97530- Therapeutic activity, [id number]- Neuromuscular re-education, 97535- Self Care, 02859- Manual therapy, [phone]- Gait training, [phone]- Aquatic Therapy, [phone]- Ionotophoresis 4mg /ml Dexamethasone , 79439 (1-2 muscles), 20561 (3+ muscles)- Dry Needling, Patient/Family education, Balance training, Stair training, Taping, Joint mobilization, DME instructions, Cryotherapy, and Moist heat.  PLAN FOR NEXT SESSION: aquatic:complete  Land:  continue to progress balance and glute/quad strength, continue with more dynamic surfaces/activities, continue to work on back spasm/pain if its still present   Ronal Kem) Ziemba MPT 03/08/2024 10:07 AM [facility] [address] [place], [place], [address] Phone: [phone]   Fax:  [phone]     "

## 2024-03-08 ENCOUNTER — Encounter (HOSPITAL_BASED_OUTPATIENT_CLINIC_OR_DEPARTMENT_OTHER): Payer: Self-pay | Admitting: Physical Therapy

## 2024-03-16 ENCOUNTER — Telehealth: Payer: Self-pay | Admitting: Neurology

## 2024-03-16 NOTE — Telephone Encounter (Signed)
 MYC cxl

## 2024-03-30 ENCOUNTER — Ambulatory Visit (HOSPITAL_BASED_OUTPATIENT_CLINIC_OR_DEPARTMENT_OTHER): Payer: Self-pay | Admitting: Physical Therapy

## 2024-03-30 ENCOUNTER — Encounter (HOSPITAL_BASED_OUTPATIENT_CLINIC_OR_DEPARTMENT_OTHER): Payer: Self-pay | Admitting: Physical Therapy

## 2024-03-30 DIAGNOSIS — G8929 Other chronic pain: Secondary | ICD-10-CM

## 2024-03-30 DIAGNOSIS — M5459 Other low back pain: Secondary | ICD-10-CM

## 2024-03-30 DIAGNOSIS — M6281 Muscle weakness (generalized): Secondary | ICD-10-CM

## 2024-03-30 DIAGNOSIS — R2681 Unsteadiness on feet: Secondary | ICD-10-CM

## 2024-03-30 NOTE — Therapy (Signed)
 " OUTPATIENT PHYSICAL THERAPY THORACOLUMBAR TREATMENT   Patient Name: [name] MRN: [id number] DOB:Jan 11, 1941, 84 y.o., female Today's Date: 03/30/2024  END OF SESSION:  PT End of Session - 03/30/24 1207     Visit Number 28    Number of Visits 16    Date for Recertification  04/25/24    Authorization Type aetna Mcr    Progress Note Due on Visit 29    PT Start Time 1100    PT Stop Time 1140    PT Time Calculation (min) 40 min    Equipment Utilized During Treatment Gait belt    Activity Tolerance Patient tolerated treatment well    Behavior During Therapy WFL for tasks assessed/performed               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   Hyperlipidemia    Impaired hearing    left ear, wears hearing aids   Insomnia    circadian rhythm component   Insomnia    Migraine 11/1986   Spillman   Nuclear sclerosis    OSA on CPAP 03/2005   uses cpap setting of 10   Osteoarthritis 2007   Deveschwar   Pain last week   left under breast pain    Plantar fasciitis    Rheumatic fever    Scarlet fever as child   Sleep apnea    Swallowing difficulty    Thyroid  disorder    Vitamin D  deficiency    Vitreous degeneration of right eye    Past Surgical History:  Procedure Laterality Date   BREAST BIOPSY Left 6/00   [place]   BREAST EXCISIONAL BIOPSY Left 1998   DE QUERVAIN'S RELEASE Right 10/97   Sypher   left breast biopsy     ROOT CANAL  02/11/2012   TONSILLECTOMY  age 6   TONSILLECTOMY AND ADENOIDECTOMY  1952   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   TOTAL KNEE ARTHROPLASTY Right 11/08/2012   Procedure: RIGHT TOTAL KNEE ARTHROPLASTY;  Surgeon: Dempsey LULLA Moan, MD;  Location: [facility];  Service: Orthopedics;  Laterality:  Right;   TUBAL LIGATION     Patient Active Problem List   Diagnosis Date Noted   Venous stasis of lower extremity 07/28/2023   Great toe pain, right 06/16/2023   Acute pain of right knee 05/28/2023   Closed compression fracture of body of first lumbar vertebra (HCC) 05/28/2023   Bilateral lower extremity edema 02/03/2022   Fluid retention 12/17/2021   Low back pain 10/03/2021   Atherosclerotic heart disease of native coronary artery without angina pectoris 05/27/2021   Diastolic dysfunction 05/27/2021   Neck pain 05/27/2021   Migraine 04/23/2021   Generalized obesity 04/23/2021   Depression 03/18/2021   Lumbar spine pain 08/30/2020   Flatulence, eructation and gas pain 05/07/2020   Change in bowel habit 05/07/2020   Gout 06/22/2019   Post viral syndrome 06/15/2019   Cervicalgia 06/15/2019   Chronic tension-type headache, intractable 06/15/2019   Fall 04/21/2018   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   Diverticulosis 12/23/2017   History of adenomatous polyp of colon 12/23/2017   Recurrent falls 12/14/2017   Degenerative disc disease, cervical 12/14/2017  Chronic constipation 12/14/2017   Floaters in visual field, bilateral 08/10/2017   Head injury consultation 08/10/2017   Arthritis of carpometacarpal [facility]) joint of right thumb 05/14/2017   Therapeutic opioid-induced constipation (OIC) 02/05/2017   BMI 29.0-29.9,adult 02/05/2017   Dry mouth 02/05/2017   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/08/2017   Intolerance of continuous positive airway pressure (CPAP) ventilation 01/08/2017   Benign neoplasm of colon 11/11/2016   Other long term (current) drug therapy 11/11/2016   History of total knee arthroplasty, 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   Cough 03/06/2016   At risk for injury related to fall 02/14/2016   Primary osteoarthritis of both hands 02/04/2016   Osteopenia of multiple sites 02/04/2016   Metatarsalgia of both feet 02/04/2016   Migraines 02/04/2016   Other  fatigue 02/01/2016   Melena 08/20/2015   Acute recurrent maxillary sinusitis 08/20/2015   Slow transit constipation 08/20/2015   Vitamin D  deficiency 08/20/2015   Thyroid  activity decreased 08/20/2015   Cephalalgia 08/20/2015   Preop cardiovascular exam 07/20/2015   Subacute ethmoidal sinusitis 12/11/2014   Chronic infection of sinus 07/12/2014   Nasal septal ulcer 05/25/2014   DJD (degenerative joint disease), cervical 03/22/2014   Right hand pain 03/22/2014   Deflected nasal septum 01/30/2014   Hypertrophy of nasal turbinates 01/30/2014   UARS (upper airway resistance syndrome) 10/04/2013   Insomnia secondary to depression with anxiety 10/04/2013   Exertional dyspnea 08/20/2013   History of rheumatic fever 08/20/2013   Hypomania (mild) single episode or unspecified 08/09/2013   OSA on CPAP 04/06/2013   Postoperative anemia due to acute blood loss 11/09/2012   OA (osteoarthritis) of knee 11/08/2012   History of giardia infection 07/15/2012   Atrophic vaginitis 07/15/2012   Vitreous degeneration of right eye    Nuclear sclerosis    Right shoulder pain 06/18/2011   Foot pain, left 06/18/2011   Hyperlipidemia 03/24/2011   Disorder of bone 02/12/2009   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 I have been doing better.    EvalBETHA Rasmussen in June. Have had  2 compression fx.    2 falls in one day early march 1st compression fx then again in June sustaining a second.  Has a membership here at [facility]. I have been Walking in pool 4 x week for 45 mins to 1 hr. But I still hurt. I want to work on my balance and stand up from a chair without using my arms.  Standing up from a chair my right knee and back hurts. Using rollator when walking back to pool but not any other time  PERTINENT HISTORY:  Evaluate and treat for L1 compression fx and right knee pain. Include aquatic therapy in treatment plan.  Chronic fatigue; fibromyalgia  PAIN:  Are you having pain? yes: NPRS scale:7/10 Pain location: R side low back Pain description: very tight, sore to touch   Aggravating factors: not sure Relieving factors: rest. Voltaren  and heat   PRECAUTIONS: Fall  RED FLAGS: None   WEIGHT BEARING RESTRICTIONS: No  FALLS:  Has patient fallen in last 6 months? Yes. Number of falls 1 fall backwards after putting my foot on a chair to toe my shoe.  LIVING ENVIRONMENT: Lives with: lives with their spouse Lives in: House/apartment Has following equipment at home: Single point cane, Environmental Consultant - 2 wheeled, Environmental Consultant - 4 wheeled, and Grab bars  OCCUPATION: retired financial controller  PLOF: Independent  PATIENT GOALS: Improve balance, get in and out of chairs, car and bed better  NEXT MD VISIT: as needed  OBJECTIVE:  Note: Objective measures were completed at Evaluation unless otherwise noted.  DIAGNOSTIC FINDINGS:  7/25 CT Lumbar IMPRESSION: 1. Subacute compression deformities at T12 and L1 with loss of height of 10% at each level. Sclerotic change consistent with ongoing healing process. I cannot state by CT if there is complete healing. No retropulsed bone. 2. L3-4: Mild disc bulge and facet osteoarthritis. No  compressive stenosis. 3. L4-5: Mild disc bulge. Bilateral facet degeneration and hypertrophy. 1-2 mm of degenerative anterolisthesis. Mild narrowing of both lateral recesses, not grossly compressive. 4. L5-S1: Endplate osteophytes and bulging of the disc. Bilateral facet osteoarthritis worse on the right. No compressive stenosis.  05/29/23 Xray R knee IMPRESSION: 1. 9.3 x 3.9 x 3.7 cm heterogenous layering hypodense fluid collection most compatible with a hematoma extending along the superficial fascia of the right posterolateral thigh, overlying and compressing the biceps femoris musculature, which could represent a soft tissue degloving injury, such as a Morel-Lavallee lesion. This collection appears to continue below the level of the knee into the lateral gastrocnemius muscle, concerning for intramuscular hematoma. 2. Intact right total knee arthroplasty. No acute osseous abnormality. 3. Small knee joint effusion.  PATIENT SURVEYS:  ODI: 23/50=46%                       ODI 20/50 = 40% COGNITION: Overall cognitive status: Within functional limits for tasks assessed     SENSATION: WFL  MUSCLE LENGTH: Hamstrings: wfl   POSTURE: rounded shoulders, forward head, decreased thoracic kyphosis, and left pelvic obliquity  PALPATION: Right patella crepitus  LUMBAR ROM:   AROM eval 12/29/23 02/08/24  Flexion full    Extension     Right lateral flexion 90% limited P! 50% limited P! 40% limited P!   Left lateral flexion 90% limited P! 50% limited P! 75% limited P!  Right rotation     Left rotation      (Blank rows = not tested)  LOWER EXTREMITY ROM:     Active  Right eval Left eval  Hip flexion    Hip extension    Hip abduction    Hip adduction    Hip internal rotation    Hip external rotation    Knee flexion 110 128  Knee extension 0 0  Ankle dorsiflexion    Ankle plantarflexion    Ankle inversion    Ankle eversion     (Blank rows = not tested)  LOWER  EXTREMITY MMT:    MMT Right eval Left eval R / L 12/29/23 R 01/25/24 L 01/25/24 R 02/08/24 L 02/08/24  Hip flexion 3 3 3+ / 3+ 3 3+ 4 4  Hip extension         Hip abduction 4+ 4+ 5- / 5- 4- sidelying  4+ sidelying  4 4  Hip abd 4- 4- 5 /5      Hip internal rotation         Hip external rotation         Knee flexion 4 5 5 5 5  4+ 4+  Knee extension 3+ 4 4 4+ 4+ 4+ 4+  Ankle dorsiflexion         Ankle plantarflexion         Ankle inversion         Ankle eversion          (Blank rows = not tested)  LUMBAR SPECIAL TESTS:  Slump test: Negative  FUNCTIONAL TESTS:  30 seconds chair stand test Timed up and go (TUG): 21.24   02/08/24 TUG 13.5 seconds no device  30 second chair stand test x9 reps using UEs on chair   03/01/24: 4 laps around clinic 6.54 minutes with [facility]    Item Test date: 11/05/23 Date: 12/29/23 Date: 01/25/24  Sitting to standing 2. able to stand using hands after several tries 3. able to stand independently using hands  4. able to stand safely for 2 minutes  4. able to sit safely and securely for 2 minutes   3. controls descent by using hands  3. able to transfer safely with definite need of hands  4. able to stand 10 seconds safely  3. able to place feet together independently and stand 1 minute with supervision  4. can reach forward confidently 25 cm (10 inches)    2. unable to pick up but reaches 2-5 cm (1-2 inches) from slipper and keeps balance independently  3. looks behind one side only, other side shows less weight shift    1. needs close supervision or verbal cuing  0. needs assistance to keep from falling/unable to try    0. loses balance while stepping or standing   0. unable to try of needs assist to prevent fall     Insert SmartPhrase OPRCBERGREEVAL  2. Standing unsupported 4. able to stand safely for 2 minutes    3. Sitting with back /unsupported, feet supported 4. able to sit safely and securely for 2 minutes    4. Standing to sitting  2. uses back of legs against chair to control descent    5. Pivot transfer  3. able to transfer safely with definite need of hands    6. Standing unsupported with eyes closed 2. able to stand 3 seconds    7. Standing unsupported with feet together 1. needs help to attain position but  able to stand 15 seconds feet together    8. Reaching forward with outstretched arms while standing 2. can reach forward 5 cm (2 inches)    9. Pick up object from the floor from standing 3. able to pick up slipper but needs supervision    10. Turning to look behind over left and right shoulders while standing 3. looks behind one side only, other side shows less weight shift    11. Turn 360 degrees 1. needs close supervision or verbal cuing    12. Place alternate foot on step or stool while standing unsupported 0. needs assistance to keep from falling/unable to try    13. Standing unsupported one foot in front 0. loses balance while stepping or standing    14. Standing on one leg 0. unable to try of needs assist to prevent fall      Total Score 27/56 Total Score:   34/56 Total Score: 43/56       01/25/24   Standardized Balance Assessment  Standardized Balance Assessment Berg Balance Test  Berg Balance Test  Sit to Stand 4  Standing Unsupported 4  Sitting with Back Unsupported but Feet Supported on Floor or Stool 4  Stand to Sit 4  Transfers 4  Standing Unsupported with Eyes Closed 3  Standing Unsupported with Feet Together 3  From Standing, Reach Forward with Outstretched Arm 3  From Standing Position, Pick up Object from Floor 3  From Standing Position, Turn to Look Behind Over each Shoulder 3  Turn 360 Degrees 2  Standing Unsupported, Alternately Place Feet on Step/Stool 3  Standing Unsupported, One Foot in Front 0  Standing on One Leg 3  Total Score 43        GAIT: Distance walked: 400 ft Assistive device utilized: rollator Level of assistance: Complete Independence Comments: quickened short  step length, reduced hip/knee flex, little arm swing  TREATMENT  [facility] Adult PT Treatment:    02/04 Nustep for 5 mins, lvl 4 Gait:  Resisted walking with bar and blue therabands Sit to stand x10, x10 with10lb kettle bell Side stepping at bar x4 laps Retro walking at bar x4 laps Cognitive challenges while walking  Standing with EO, EO while reaching for ABC's, 123's.  SLS  Seated marches  Standing marches Standing abduction/ extension (diagonal).    1/12  There-ex:  WU nustep for 5 mins Seated abd 3x12 red Seated ext 3x12 red Seated march 3x12 red Updated and reviewed HEP   Neuro-re-ed Balance training - standing, tandem stance, eyes closed 2x30 sec hold On airex - tandem 2x30 sec hold  Foot rolls (lean forward and back) with CGA  03/03/24 Pt seen for aquatic therapy today.  Treatment took place in water  3.5-4.75 ft in depth at the [facility] pool. Temp of water  was 91.  Pt entered/exited the pool via stairs with bil rail.   Exercises - suitcase carry with single hand float at side (or on both sides), walking forward/backward   - Side Stepping with or without Hand floats  - Open Book. Stand  near wall.   Head follows hand for spinal twist.  - Tight rope toe to heel walk - forward/backward  - L stretch at pool wall  - Leg kick to side- hold pool wall  (lead with heel, not too high)  - Sit to Stand with forward arm reach (stick your bottom out and bend your knees) - slow and controlled motion  - Standing toe taps to first step,  alternating legs  - Standing on one leg and moving noodle under water  in opposite hand  - Wall Push Up and Off    03/01/24 Nustep L3x10 minutes seat 9 all LE extremities  Seated marching Mimicked putting on pants with RTB, taking on/ off feet and pulling up around thighs.  Discussed increasing POC at next progress note, updated goals today with increased focus on patients current complaints.  Walking with SPC and gait belt to  assess gait and distance. Pt made it 4 laps in 6.5 minutes.   02/26/24 Pt seen for aquatic therapy today.  Treatment took place in water  3.5-4.75 ft in depth at the [facility] pool. Temp of water  was 91.  Pt entered/exited the pool via stairs with bil rail.  -warm up of walking forward/ backward, unsupported, multiple laps - suitcase carry with single yellow hand float under water  at side, walking backward / forward - L stretch at wall - wall push up and off, x 12 - minor cues for form - SLS with opposite hand holding hollow noodle under water  at side - L stretch at wall - tandem gait without support (difficult)/ with yellow hand floats (improved) -side stepping into wide squat with UE add/abd with rainbow hand floats -UE on wall: hip abdct/ add x 10; single leg clams x 10 each - STS on 3rd step x 8 reps with SBA (improved form with repetition and VC)  02/22/24 Nustep L4x8 minutes seat 9 all LE extremities  LAQ 2x10  Seated marching 2x10  Supine LTR x20  Supine SLR x10  Supine marching  Sit to stand with momentum strategy   Discussed coming in on weekends to walk in the water  Discussion about importance of    02/19/24 Pt seen for aquatic therapy today.  Treatment took place in water  3.5-4.75 ft in depth at the [facility] pool. Temp of water  was 91.  Pt entered/exited the pool via stairs with bil rail.  -walking forward/ backward, unsupported, multiple laps, cues for even step height, neutral Rt foot -side stepping with UE add/abd with rainbow hand floats -> into wide squat with arm add/abdct -in 64ft 6 tandem gait forward/ backward, UE on rainbow hand floats (big balance challenge) - L stretch at wall - return to walking forward/ backward with reciprocal arm swing - forward step with single row with  medium resistance bell, and return to neutral x 10 each side (balance challenge)  -UE on wall: hip abdct/ add x 10; single leg clams x 10 each - wall push  up/off x10 - STS on 3rd step x 3 reps with SBA (improved form with repetition)  02/15/24:                                           Nustep L6x8 minutes seat 10 all four extremities  Seated LAQ Seated marching  1 lap around clinic with focus on big steps Sit to stands from hi -low table  Toe tap to cone in center to work on hip flexion and ER for putting on pants Stepping over tape on floor in square to help with big steps in lateral/ fwd/ bkwd directions Assessment of coordination on LE Discussion about parkinson type symptoms.    02/12/24 Pt seen for aquatic therapy today.  Treatment took place in water  3.5-4.75 ft in depth at the [facility] pool. Temp of water   was 91.  Pt entered/exited the pool via stairs with bil rail.  -walking forward and backward, unsupported, multiple laps, cues for even step length and vertical trunk -side stepping with ue horizontal add/abd with rainbow hand floats -> abdct/add with rainbow hand floats - suitcase carry with single rainbow hand float, walking forward/backward  -Bow and Arrow (difficulty coordinating). Focused on weight shift back/forth. - forward step with single row with light resistance bell, and return to neutral x 8 each side - L stretch with UE on wall x 3 reps of 10-15 sec - return to walking forward/ backward  -SLS in 4+ ft with shoulder add/abdct with short hollow noodles x 5 eac h(increased back pain) - repeated L stretch at wall - tandem gait forward/ backward, hands on top of water  (challenge) - STS on 3rd step x 3 reps with SBA (improved form with repetition)  02/08/24  Nustep L6x8 minutes seat 10 all four extremities   ODI, MMT, TUG, 30 second chair stand test, lumbar ROM, goals  Tandem stance blue foam 6x30 seconds alternating SLS x15 seconds B alternating   02/05/24 Pt seen for aquatic therapy today.  Treatment took place in water  3.5-4.75 ft in depth at the [facility] pool. Temp of water  was 91.   Pt entered/exited the pool via stairs with bil rail.  -walking forward and back -L stretch->hip hiking -side stepping with ue horizontal add/abd -wide stance ue horizontal add/abd -Bow and Arrow (difficulty coordinating). Focused on weight shift back/forth. -STS from water  bench onto step unsupported 2x 10.  VC and demonstration for execution. Slowed descent.  Good execution no LOB -figure 4 stretch at steps (Not tolerated well) -open book R/L x 5  02/01/24  Nustep L6x8 minutes seat 10 all four extremities   Lumbar rotation stretch 5x5 seconds B  SKTC 5x5 seconds B Figure 4 stretch B 2x30 seconds Thoracic flexion/extension AROM x10   Tandem stance blue foam 6x30 seconds alternating  Sidesteps on blue foam pad x3 laps   01/25/24  Nustep L6x8 minutes seat 10 all four extremities  MMT, Berg, goals, education on progress with PT  Tandem stance blue foam pad 6x30 seconds alternating Standing marches/tapping top of 6 inch box blue foam pad x20  Forward and lateral steps over hurdles at bar x2 laps Lateral steps over hurdles x1 lap at bar, got light headed- BP 133/72 HR 76, dizziness resolved before we checked SpO2    PATIENT EDUCATION:  Education details: intro to aquatic therapy   Person educated: Patient Education method: Explanation, demonstration Education comprehension: verbalized understanding  HOME EXERCISE PROGRAM:  Access Code: [email or web address] URL: [email or web address] Date: 02/01/2024 Prepared by: Josette Rough  Exercises - Theracane Over Shoulder  - 1 x daily - 7 x weekly - 3 sets - 10 reps - Seated Bilateral Shoulder Flexion Towel Slide at Table Top  - 1 x daily - 7 x weekly - 3 sets - 10 reps - Sit to Stand with Arms Crossed  - 2 x daily - 7 x weekly - 2 sets - 10 reps - Seated Hip Abduction with Resistance  - 1 x daily - 7 x weekly - 3 sets - 10 reps - Seated Long Arc Quad  - 1 x daily - 7 x weekly - 3 sets - 10 reps - Standing March with Counter  Support  - 1 x daily - 7 x weekly - 3 sets - 10 reps - Standing Hip Abduction with Counter Support  - 1 x daily -  7 x weekly - 3 sets - 10 reps - Standing Hip Extension with Counter Support  - 1 x daily - 7 x weekly - 3 sets - 10 reps - Narrow Stance with Counter Support  - 1 x daily - 7 x weekly - 3 sets - 10 reps - Standing Tandem Balance with Counter Support  - 1 x daily - 7 x weekly - 3 sets - 10 reps - Supine Lower Trunk Rotation  - 1 x daily - 7 x weekly - 1-2 sets - 10 reps - 5 seconds  hold - Supine Single Knee to Chest Stretch  - 1 x daily - 7 x weekly - 1-2 sets - 10 reps - 5 seconds  hold - Supine Figure 4 Piriformis Stretch  - 1 x daily - 7 x weekly - 1-2 sets - 6 reps - 30 seconds  hold - Seated Thoracic Flexion and Extension  - 1 x daily - 7 x weekly - 1-2 sets - 10 reps  Access Code: [email or web address] URL: [email or web address] Date: 03/03/2024 Prepared by: Matilda Kohut  Exercises - suitcase carry with single hand float at side (or on both sides), walking forward/ backward  - 1-3 x weekly - Side Stepping with or without Hand floats   - 1-3 x weekly - Open Book. Stand  near wall.   Head follows hand for spinal twist.  - 1-3 x weekly - 5-10 reps - Tight rope toe to heel walk - forward/backward  - 1-2 x weekly - 3 reps - L stretch at pool wall   - 2 x daily - 1-3 x weekly - 2 reps - 10 seconds hold - Leg kick to side- hold pool wall  (lead with heel, not too high)  - 1-3 x weekly - 1-2 sets - 10 reps - Sit to Stand with forward arm reach (stick your bottom out and bend your knees) - slow and controlled motion  - 1-3 x weekly - 1 sets - 5-10 reps - Standing toe taps to first step, alternating legs  - 1-3 x weekly - 1-2 sets - 10 reps - Standing on one leg and moving noodle under water  in opposite hand  - 1-3 x weekly - 2-3 reps - 15-30 seconds  hold - Wall Push Up and Off   - 1-3 x weekly - 1-2 sets - 10 reps ASSESSMENT:  CLINICAL IMPRESSION: Pt was challenged  throughout session with balance activities. She tolerated them all well with no LOB. She has shown overall improvement in balance and mobility with less frequent cues. Pt will continue to benefit from skilled PT to address continued deficits.    PN#3 - Pt has made great progress with PT thus far. She has now able to walk with a SPC instead of a walker. She would like to ultimately like to walk without an AD. Pt continues to struggle with narrow base of support exercises. She also ambulates with a shuffled gait, but does better with cues. She reports difficulty with ADL's such as negotiating stairs, and getting dressed. Pt also complains of difficulty walking outside due to fear of falling and negotiating curbs. We have discussed possibility of having undiagnosed early onset of parkinson's. Neuro MD is aware and will assess and schedule MRI at next meeting. Pain in back has improved significantly. She states that it eases up with movement and notes more discomfort with increased standing. Pt would continue to benefit from skilled PT to continue working on residual deficits.  PN #2- patient continues to have back pain but she has made great progress in terms of functional strength, balance, and overall mobility. She does however remain a bit of a high fall risk and also demonstrates impaired lumbar ROM as well as impaired functional activity tolerance. Feel that she would benefit from skilled PT services in order to continue fine tuning all remaining impairments/working toward goals yet unmet, also to establish appropriate long term independent exercise programming prior to possible DC at end of current cert/scheduled visits.      OBJECTIVE IMPAIRMENTS: Abnormal gait, decreased activity tolerance, decreased balance, decreased knowledge of use of DME, decreased mobility, difficulty walking, decreased ROM, decreased strength, postural dysfunction, obesity, and pain.   ACTIVITY LIMITATIONS: carrying,  lifting, bending, sitting, standing, squatting, stairs, and transfers  PARTICIPATION LIMITATIONS: meal prep, cleaning, laundry, driving, shopping, and community activity  PERSONAL FACTORS: Age and 1-2 comorbidities: see PmHx are also affecting patient's functional outcome.   REHAB POTENTIAL: Good  CLINICAL DECISION MAKING: Evolving/moderate complexity  EVALUATION COMPLEXITY: Moderate   GOALS: Goals reviewed with patient? Yes  SHORT TERM GOALS: Target date: 12/14/23 (delay in beginning therapy)  Pt will tolerate full aquatic sessions consistently without increase in pain and with improving function to demonstrate good toleration and effectiveness of intervention.  Baseline: Goal status: Met 12/29/23  2.  Pt will tolerate stair climbing in and out of pool using approp pattern ascending and descending 6 steps with use of handrail  Baseline:  Goal status: Met 12/29/23  3.  Pt will perform 10 STS from water  bench onto water  step unsupported without LOB Baseline:  Goal status:  Met 12/29/23  4.  Pt and cg will consider grab bars for commode  Baseline:  Goal status: deferred as pt reports she is rising off of a raised commode without difficulty 12/29/23  5.  Pt will be indep and compliant with initial land based HEP for improving strength and safety at home Baseline:  Goal status: Met 12/29/23    LONG TERM GOALS: Target date: POC date   Pt to improve on ODI by 13% to demonstrate statistically significant Improvement in function. (MCID 13-15%) Baseline: 23/50=46% Goal status: ONGOING 02/08/24   2.  Pt will improve strength in bil hip flex and right knee ext by 1 grade to demonstrate improved overall physical function Baseline: see chart Goal status:  MET 02/08/24  3.  Pt will improve on Berg balance test to >/= 35/56 to demonstrate a decrease in fall risk. (MCD 7) Baseline:27/56 (medium fall risk) ; 34/56 Goal status: MET 01/25/24 see above   4.  Pt will improve on 30s STS  test  from standard chair from 3 to 6 to demonstrate improving functional lower extremity strength, transitional movements, and balance. (MDC = 4.2sec)  Baseline: 3 Goal status: MET 02/08/24   5.  Pt will report decrease in pain by at least 50% for improved toleration to activity/quality of life and to demonstrate improved management of pain. Baseline: see chart Goal status: MET 03/01/24  6. Pt will be indep with final HEP's (land and aquatic as appropriate) for continued management of condition Baseline: see chart Goal status: ONGOING 02/08/24 ; Partially Met 03/03/24 (aquatics) 7. I would like to be able to put on my clothing (pants) without feeling unstable.   Baseline: Pt is unable to put on her own underwear and pants without increased time or without sitting on a commode.   Goal status: NEW 8. Pt would like to negotiate stairs  with reciprocal gait pattern without fear of falling.   Baseline: Increased time and step to gait pattern  Goal status: NEW 9. Pt would like to be able to walk community distances with [facility] or without AD without shuffling.   Baseline: Pt ambulates intermittently with SPC and shuffled gait.   Goal status: NEW   PLAN:  PT FREQUENCY: 2x/week  PT DURATION: 10 w alternate land/aquatics  PLANNED INTERVENTIONS: 02835- PT Re-evaluation, 97750- Physical Performance Testing, 97110-Therapeutic exercises, 97530- Therapeutic activity, [id number]- Neuromuscular re-education, 97535- Self Care, 02859- Manual therapy, [phone]- Gait training, [phone]- Aquatic Therapy, [phone]- Ionotophoresis 4mg /ml Dexamethasone , 79439 (1-2 muscles), [id number] (3+ muscles)- Dry Needling, Patient/Family education, Balance training, Stair training, Taping, Joint mobilization, DME instructions, Cryotherapy, and Moist heat.  PLAN FOR NEXT SESSION: aquatic:complete  Land:  continue to progress balance and glute/quad strength, continue with more dynamic surfaces/activities, continue to work on back spasm/pain if  its still present   Rojean Batten PT, DPT 03/30/24  12:22 PM     "

## 2024-04-12 ENCOUNTER — Ambulatory Visit: Admitting: Neurology

## 2024-04-25 ENCOUNTER — Encounter (HOSPITAL_BASED_OUTPATIENT_CLINIC_OR_DEPARTMENT_OTHER): Payer: Self-pay | Admitting: Physical Therapy

## 2024-05-04 ENCOUNTER — Ambulatory Visit (HOSPITAL_BASED_OUTPATIENT_CLINIC_OR_DEPARTMENT_OTHER): Payer: Self-pay | Admitting: Physical Therapy

## 2024-05-09 ENCOUNTER — Encounter (HOSPITAL_BASED_OUTPATIENT_CLINIC_OR_DEPARTMENT_OTHER): Payer: Self-pay | Admitting: Physical Therapy

## 2024-05-16 ENCOUNTER — Encounter (HOSPITAL_BASED_OUTPATIENT_CLINIC_OR_DEPARTMENT_OTHER): Payer: Self-pay | Admitting: Physical Therapy

## 2024-05-23 ENCOUNTER — Ambulatory Visit: Admitting: Neurology
